# Patient Record
Sex: Female | Born: 1945 | Race: White | Hispanic: No | Marital: Single | Smoking: Smoker, current status unknown
Health system: Southern US, Community
[De-identification: ages and names within clinical notes are randomized; demographics above are authoritative.]

## PROBLEM LIST (undated history)

## (undated) DIAGNOSIS — I10 Essential (primary) hypertension: Secondary | ICD-10-CM

## (undated) DIAGNOSIS — N189 Chronic kidney disease, unspecified: Secondary | ICD-10-CM

## (undated) DIAGNOSIS — E119 Type 2 diabetes mellitus without complications: Secondary | ICD-10-CM

## (undated) DIAGNOSIS — J9610 Chronic respiratory failure, unspecified whether with hypoxia or hypercapnia: Secondary | ICD-10-CM

## (undated) DIAGNOSIS — K5981 Ogilvie syndrome: Secondary | ICD-10-CM

## (undated) DIAGNOSIS — K219 Gastro-esophageal reflux disease without esophagitis: Secondary | ICD-10-CM

## (undated) DIAGNOSIS — F0150 Vascular dementia without behavioral disturbance: Secondary | ICD-10-CM

## (undated) DIAGNOSIS — Z930 Tracheostomy status: Secondary | ICD-10-CM

---

## 2020-01-05 ENCOUNTER — Emergency Department (HOSPITAL_COMMUNITY): Payer: Medicare Other

## 2020-01-05 ENCOUNTER — Other Ambulatory Visit: Payer: Self-pay

## 2020-01-05 ENCOUNTER — Encounter (HOSPITAL_COMMUNITY): Payer: Self-pay | Admitting: Pulmonary Disease

## 2020-01-05 ENCOUNTER — Inpatient Hospital Stay (HOSPITAL_COMMUNITY)
Admission: EM | Admit: 2020-01-05 | Discharge: 2020-03-21 | DRG: 870 | Disposition: A | Discharge status: Skilled Nursing Facility | Payer: Medicare Other | Attending: Internal Medicine | Admitting: Internal Medicine

## 2020-01-05 DIAGNOSIS — I132 Hypertensive heart and chronic kidney disease with heart failure and with stage 5 chronic kidney disease, or end stage renal disease: Secondary | ICD-10-CM | POA: Diagnosis present

## 2020-01-05 DIAGNOSIS — N179 Acute kidney failure, unspecified: Secondary | ICD-10-CM | POA: Diagnosis not present

## 2020-01-05 DIAGNOSIS — E669 Obesity, unspecified: Secondary | ICD-10-CM | POA: Diagnosis present

## 2020-01-05 DIAGNOSIS — J9611 Chronic respiratory failure with hypoxia: Secondary | ICD-10-CM

## 2020-01-05 DIAGNOSIS — D631 Anemia in chronic kidney disease: Secondary | ICD-10-CM | POA: Diagnosis not present

## 2020-01-05 DIAGNOSIS — R0602 Shortness of breath: Secondary | ICD-10-CM

## 2020-01-05 DIAGNOSIS — E039 Hypothyroidism, unspecified: Secondary | ICD-10-CM | POA: Diagnosis present

## 2020-01-05 DIAGNOSIS — J9600 Acute respiratory failure, unspecified whether with hypoxia or hypercapnia: Secondary | ICD-10-CM

## 2020-01-05 DIAGNOSIS — K5981 Ogilvie syndrome: Secondary | ICD-10-CM | POA: Diagnosis present

## 2020-01-05 DIAGNOSIS — I469 Cardiac arrest, cause unspecified: Secondary | ICD-10-CM | POA: Diagnosis not present

## 2020-01-05 DIAGNOSIS — R579 Shock, unspecified: Secondary | ICD-10-CM | POA: Diagnosis present

## 2020-01-05 DIAGNOSIS — I959 Hypotension, unspecified: Secondary | ICD-10-CM

## 2020-01-05 DIAGNOSIS — Z7189 Other specified counseling: Secondary | ICD-10-CM | POA: Diagnosis not present

## 2020-01-05 DIAGNOSIS — L8990 Pressure ulcer of unspecified site, unspecified stage: Secondary | ICD-10-CM | POA: Insufficient documentation

## 2020-01-05 DIAGNOSIS — Z79899 Other long term (current) drug therapy: Secondary | ICD-10-CM

## 2020-01-05 DIAGNOSIS — E1122 Type 2 diabetes mellitus with diabetic chronic kidney disease: Secondary | ICD-10-CM | POA: Diagnosis present

## 2020-01-05 DIAGNOSIS — Z6841 Body Mass Index (BMI) 40.0 and over, adult: Secondary | ICD-10-CM | POA: Diagnosis not present

## 2020-01-05 DIAGNOSIS — Z9115 Patient's noncompliance with renal dialysis: Secondary | ICD-10-CM

## 2020-01-05 DIAGNOSIS — J151 Pneumonia due to Pseudomonas: Secondary | ICD-10-CM | POA: Diagnosis present

## 2020-01-05 DIAGNOSIS — R601 Generalized edema: Secondary | ICD-10-CM | POA: Diagnosis not present

## 2020-01-05 DIAGNOSIS — R1010 Upper abdominal pain, unspecified: Secondary | ICD-10-CM | POA: Diagnosis not present

## 2020-01-05 DIAGNOSIS — J9622 Acute and chronic respiratory failure with hypercapnia: Secondary | ICD-10-CM | POA: Diagnosis present

## 2020-01-05 DIAGNOSIS — J440 Chronic obstructive pulmonary disease with acute lower respiratory infection: Secondary | ICD-10-CM | POA: Diagnosis present

## 2020-01-05 DIAGNOSIS — G894 Chronic pain syndrome: Secondary | ICD-10-CM | POA: Diagnosis not present

## 2020-01-05 DIAGNOSIS — N186 End stage renal disease: Secondary | ICD-10-CM | POA: Diagnosis present

## 2020-01-05 DIAGNOSIS — N170 Acute kidney failure with tubular necrosis: Secondary | ICD-10-CM | POA: Diagnosis present

## 2020-01-05 DIAGNOSIS — Z781 Physical restraint status: Secondary | ICD-10-CM

## 2020-01-05 DIAGNOSIS — R059 Cough, unspecified: Secondary | ICD-10-CM

## 2020-01-05 DIAGNOSIS — E1165 Type 2 diabetes mellitus with hyperglycemia: Secondary | ICD-10-CM | POA: Diagnosis not present

## 2020-01-05 DIAGNOSIS — J90 Pleural effusion, not elsewhere classified: Secondary | ICD-10-CM | POA: Diagnosis not present

## 2020-01-05 DIAGNOSIS — N19 Unspecified kidney failure: Secondary | ICD-10-CM | POA: Diagnosis not present

## 2020-01-05 DIAGNOSIS — J9601 Acute respiratory failure with hypoxia: Secondary | ICD-10-CM | POA: Diagnosis not present

## 2020-01-05 DIAGNOSIS — G9341 Metabolic encephalopathy: Secondary | ICD-10-CM | POA: Diagnosis not present

## 2020-01-05 DIAGNOSIS — Z515 Encounter for palliative care: Secondary | ICD-10-CM | POA: Diagnosis not present

## 2020-01-05 DIAGNOSIS — K5900 Constipation, unspecified: Secondary | ICD-10-CM | POA: Diagnosis present

## 2020-01-05 DIAGNOSIS — J690 Pneumonitis due to inhalation of food and vomit: Secondary | ICD-10-CM | POA: Diagnosis not present

## 2020-01-05 DIAGNOSIS — G8929 Other chronic pain: Secondary | ICD-10-CM | POA: Diagnosis present

## 2020-01-05 DIAGNOSIS — A419 Sepsis, unspecified organism: Secondary | ICD-10-CM

## 2020-01-05 DIAGNOSIS — D6959 Other secondary thrombocytopenia: Secondary | ICD-10-CM | POA: Diagnosis present

## 2020-01-05 DIAGNOSIS — R6521 Severe sepsis with septic shock: Secondary | ICD-10-CM | POA: Diagnosis present

## 2020-01-05 DIAGNOSIS — R05 Cough: Secondary | ICD-10-CM

## 2020-01-05 DIAGNOSIS — L89151 Pressure ulcer of sacral region, stage 1: Secondary | ICD-10-CM | POA: Diagnosis present

## 2020-01-05 DIAGNOSIS — T68XXXA Hypothermia, initial encounter: Secondary | ICD-10-CM | POA: Diagnosis not present

## 2020-01-05 DIAGNOSIS — J9602 Acute respiratory failure with hypercapnia: Secondary | ICD-10-CM

## 2020-01-05 DIAGNOSIS — J15212 Pneumonia due to Methicillin resistant Staphylococcus aureus: Secondary | ICD-10-CM | POA: Diagnosis present

## 2020-01-05 DIAGNOSIS — I5032 Chronic diastolic (congestive) heart failure: Secondary | ICD-10-CM | POA: Diagnosis present

## 2020-01-05 DIAGNOSIS — R609 Edema, unspecified: Secondary | ICD-10-CM | POA: Diagnosis not present

## 2020-01-05 DIAGNOSIS — E876 Hypokalemia: Secondary | ICD-10-CM | POA: Diagnosis not present

## 2020-01-05 DIAGNOSIS — R109 Unspecified abdominal pain: Secondary | ICD-10-CM | POA: Diagnosis not present

## 2020-01-05 DIAGNOSIS — R570 Cardiogenic shock: Secondary | ICD-10-CM | POA: Diagnosis present

## 2020-01-05 DIAGNOSIS — F0150 Vascular dementia without behavioral disturbance: Secondary | ICD-10-CM | POA: Diagnosis present

## 2020-01-05 DIAGNOSIS — R9389 Abnormal findings on diagnostic imaging of other specified body structures: Secondary | ICD-10-CM

## 2020-01-05 DIAGNOSIS — Z9049 Acquired absence of other specified parts of digestive tract: Secondary | ICD-10-CM

## 2020-01-05 DIAGNOSIS — I4891 Unspecified atrial fibrillation: Secondary | ICD-10-CM | POA: Diagnosis not present

## 2020-01-05 DIAGNOSIS — F339 Major depressive disorder, recurrent, unspecified: Secondary | ICD-10-CM

## 2020-01-05 DIAGNOSIS — N390 Urinary tract infection, site not specified: Secondary | ICD-10-CM | POA: Diagnosis present

## 2020-01-05 DIAGNOSIS — Z992 Dependence on renal dialysis: Secondary | ICD-10-CM

## 2020-01-05 DIAGNOSIS — J9621 Acute and chronic respiratory failure with hypoxia: Secondary | ICD-10-CM | POA: Diagnosis present

## 2020-01-05 DIAGNOSIS — Z452 Encounter for adjustment and management of vascular access device: Secondary | ICD-10-CM

## 2020-01-05 DIAGNOSIS — K219 Gastro-esophageal reflux disease without esophagitis: Secondary | ICD-10-CM | POA: Diagnosis present

## 2020-01-05 DIAGNOSIS — R531 Weakness: Secondary | ICD-10-CM | POA: Diagnosis not present

## 2020-01-05 DIAGNOSIS — R9431 Abnormal electrocardiogram [ECG] [EKG]: Secondary | ICD-10-CM | POA: Diagnosis not present

## 2020-01-05 DIAGNOSIS — D509 Iron deficiency anemia, unspecified: Secondary | ICD-10-CM | POA: Diagnosis present

## 2020-01-05 DIAGNOSIS — E871 Hypo-osmolality and hyponatremia: Secondary | ICD-10-CM | POA: Diagnosis not present

## 2020-01-05 DIAGNOSIS — Z930 Tracheostomy status: Secondary | ICD-10-CM | POA: Diagnosis not present

## 2020-01-05 DIAGNOSIS — F419 Anxiety disorder, unspecified: Secondary | ICD-10-CM | POA: Diagnosis present

## 2020-01-05 DIAGNOSIS — A4102 Sepsis due to Methicillin resistant Staphylococcus aureus: Principal | ICD-10-CM | POA: Diagnosis present

## 2020-01-05 DIAGNOSIS — E874 Mixed disorder of acid-base balance: Secondary | ICD-10-CM | POA: Diagnosis present

## 2020-01-05 DIAGNOSIS — Z20822 Contact with and (suspected) exposure to covid-19: Secondary | ICD-10-CM | POA: Diagnosis present

## 2020-01-05 DIAGNOSIS — E7220 Disorder of urea cycle metabolism, unspecified: Secondary | ICD-10-CM | POA: Diagnosis not present

## 2020-01-05 DIAGNOSIS — E11649 Type 2 diabetes mellitus with hypoglycemia without coma: Secondary | ICD-10-CM | POA: Diagnosis not present

## 2020-01-05 DIAGNOSIS — N189 Chronic kidney disease, unspecified: Secondary | ICD-10-CM | POA: Diagnosis not present

## 2020-01-05 DIAGNOSIS — Z931 Gastrostomy status: Secondary | ICD-10-CM

## 2020-01-05 DIAGNOSIS — F329 Major depressive disorder, single episode, unspecified: Secondary | ICD-10-CM | POA: Diagnosis present

## 2020-01-05 DIAGNOSIS — Y95 Nosocomial condition: Secondary | ICD-10-CM | POA: Diagnosis present

## 2020-01-05 DIAGNOSIS — Z9119 Patient's noncompliance with other medical treatment and regimen: Secondary | ICD-10-CM

## 2020-01-05 DIAGNOSIS — R5381 Other malaise: Secondary | ICD-10-CM | POA: Diagnosis not present

## 2020-01-05 DIAGNOSIS — D6489 Other specified anemias: Secondary | ICD-10-CM | POA: Diagnosis present

## 2020-01-05 DIAGNOSIS — R1312 Dysphagia, oropharyngeal phase: Secondary | ICD-10-CM | POA: Diagnosis not present

## 2020-01-05 DIAGNOSIS — Z794 Long term (current) use of insulin: Secondary | ICD-10-CM

## 2020-01-05 DIAGNOSIS — M7989 Other specified soft tissue disorders: Secondary | ICD-10-CM | POA: Diagnosis not present

## 2020-01-05 DIAGNOSIS — N184 Chronic kidney disease, stage 4 (severe): Secondary | ICD-10-CM | POA: Diagnosis not present

## 2020-01-05 HISTORY — DX: Chronic respiratory failure, unspecified whether with hypoxia or hypercapnia: J96.10

## 2020-01-05 HISTORY — DX: Chronic kidney disease, unspecified: N18.9

## 2020-01-05 HISTORY — DX: Type 2 diabetes mellitus without complications: E11.9

## 2020-01-05 HISTORY — DX: Ogilvie syndrome: K59.81

## 2020-01-05 HISTORY — DX: Vascular dementia, unspecified severity, without behavioral disturbance, psychotic disturbance, mood disturbance, and anxiety: F01.50

## 2020-01-05 HISTORY — DX: Gastro-esophageal reflux disease without esophagitis: K21.9

## 2020-01-05 HISTORY — DX: Tracheostomy status: Z93.0

## 2020-01-05 HISTORY — DX: Essential (primary) hypertension: I10

## 2020-01-05 LAB — LIPASE, BLOOD: Lipase: 90 U/L — ABNORMAL HIGH (ref 11–51)

## 2020-01-05 LAB — URINALYSIS, ROUTINE W REFLEX MICROSCOPIC
Bilirubin Urine: NEGATIVE
Glucose, UA: NEGATIVE mg/dL
Ketones, ur: 5 mg/dL — AB
Nitrite: NEGATIVE
Protein, ur: 100 mg/dL — AB
Specific Gravity, Urine: 1.018 (ref 1.005–1.030)
WBC, UA: >50 WBC/hpf — ABNORMAL HIGH (ref 0–5)
pH: 5 (ref 5.0–8.0)

## 2020-01-05 LAB — CBC WITH DIFFERENTIAL/PLATELET
Abs Immature Granulocytes: 0.08 10*3/uL — ABNORMAL HIGH (ref 0.00–0.07)
Basophils Absolute: 0 10*3/uL (ref 0.0–0.1)
Basophils Relative: 0 %
Eosinophils Absolute: 0.1 10*3/uL (ref 0.0–0.5)
Eosinophils Relative: 1 %
HCT: 30.9 % — ABNORMAL LOW (ref 36.0–46.0)
Hemoglobin: 9.4 g/dL — ABNORMAL LOW (ref 12.0–15.0)
Immature Granulocytes: 1 %
Lymphocytes Relative: 7 %
Lymphs Abs: 0.7 10*3/uL (ref 0.7–4.0)
MCH: 28.3 pg (ref 26.0–34.0)
MCHC: 30.4 g/dL (ref 30.0–36.0)
MCV: 93.1 fL (ref 80.0–100.0)
Monocytes Absolute: 0.6 10*3/uL (ref 0.1–1.0)
Monocytes Relative: 6 %
Neutro Abs: 7.9 10*3/uL — ABNORMAL HIGH (ref 1.7–7.7)
Neutrophils Relative %: 85 %
Platelets: 89 10*3/uL — ABNORMAL LOW (ref 150–400)
RBC: 3.32 MIL/uL — ABNORMAL LOW (ref 3.87–5.11)
RDW: 18.3 % — ABNORMAL HIGH (ref 11.5–15.5)
WBC: 9.3 10*3/uL (ref 4.0–10.5)
nRBC: 0 % (ref 0.0–0.2)

## 2020-01-05 LAB — COMPREHENSIVE METABOLIC PANEL
ALT: 22 U/L (ref 0–44)
AST: 25 U/L (ref 15–41)
Albumin: 2.4 g/dL — ABNORMAL LOW (ref 3.5–5.0)
Alkaline Phosphatase: 177 U/L — ABNORMAL HIGH (ref 38–126)
Anion gap: 20 — ABNORMAL HIGH (ref 5–15)
BUN: 128 mg/dL — ABNORMAL HIGH (ref 8–23)
CO2: 11 mmol/L — ABNORMAL LOW (ref 22–32)
Calcium: 9.7 mg/dL (ref 8.9–10.3)
Chloride: 101 mmol/L (ref 98–111)
Creatinine, Ser: 2.47 mg/dL — ABNORMAL HIGH (ref 0.44–1.00)
GFR calc Af Amer: 22 mL/min — ABNORMAL LOW (ref >60)
GFR calc non Af Amer: 19 mL/min — ABNORMAL LOW (ref >60)
Glucose, Bld: 113 mg/dL — ABNORMAL HIGH (ref 70–99)
Potassium: 4.9 mmol/L (ref 3.5–5.1)
Sodium: 132 mmol/L — ABNORMAL LOW (ref 135–145)
Total Bilirubin: UNDETERMINED mg/dL (ref 0.3–1.2)
Total Protein: 5.8 g/dL — ABNORMAL LOW (ref 6.5–8.1)

## 2020-01-05 LAB — PROTIME-INR
INR: 1.1 (ref 0.8–1.2)
Prothrombin Time: 14.3 seconds (ref 11.4–15.2)

## 2020-01-05 LAB — LACTIC ACID, PLASMA: Lactic Acid, Venous: 0.3 mmol/L — ABNORMAL LOW (ref 0.5–1.9)

## 2020-01-05 LAB — BRAIN NATRIURETIC PEPTIDE: B Natriuretic Peptide: 1080.6 pg/mL — ABNORMAL HIGH (ref 0.0–100.0)

## 2020-01-05 LAB — RESPIRATORY PANEL BY RT PCR (FLU A&B, COVID)
Influenza A by PCR: NEGATIVE
Influenza B by PCR: NEGATIVE
SARS Coronavirus 2 by RT PCR: NEGATIVE

## 2020-01-05 LAB — TROPONIN I (HIGH SENSITIVITY): Troponin I (High Sensitivity): 15 ng/L (ref <18)

## 2020-01-05 LAB — TSH: TSH: 5.314 u[IU]/mL — ABNORMAL HIGH (ref 0.350–4.500)

## 2020-01-05 LAB — APTT: aPTT: 34 seconds (ref 24–36)

## 2020-01-05 MED ORDER — VANCOMYCIN HCL IN DEXTROSE 1-5 GM/200ML-% IV SOLN: 1000.0000 mg | Freq: Once | INTRAVENOUS | Status: DC

## 2020-01-05 MED ORDER — GENERIC EXTERNAL MEDICATION
Status: DC
Start: ? — End: 2020-01-05

## 2020-01-05 MED ORDER — VANCOMYCIN HCL 2000 MG/400ML IV SOLN
2000.0000 mg | Freq: Once | INTRAVENOUS | Status: DC
  Filled 2020-01-05: qty 400

## 2020-01-05 MED ORDER — LEVOTHYROXINE SODIUM 50 MCG PO TABS
50.00 | ORAL_TABLET | ORAL | Status: DC
Start: 2020-01-06 — End: 2020-01-05

## 2020-01-05 MED ORDER — MELATONIN 3 MG PO TABS
3.00 | ORAL_TABLET | ORAL | Status: DC
Start: 2020-01-05 — End: 2020-01-05

## 2020-01-05 MED ORDER — HYDRALAZINE HCL 20 MG/ML IJ SOLN
10.00 | INTRAMUSCULAR | Status: DC
Start: ? — End: 2020-01-05

## 2020-01-05 MED ORDER — HEPARIN SODIUM LOCK FLUSH 100 UNIT/ML IV SOLN
300.00 | INTRAVENOUS | Status: DC
Start: ? — End: 2020-01-05

## 2020-01-05 MED ORDER — CLOTRIMAZOLE 1 % EX CREA
TOPICAL_CREAM | CUTANEOUS | Status: DC
Start: 2020-01-05 — End: 2020-01-05

## 2020-01-05 MED ORDER — HEPARIN SODIUM (PORCINE) 5000 UNIT/ML IJ SOLN
5000.0000 [IU] | Freq: Three times a day (TID) | INTRAMUSCULAR | Status: DC
  Administered 2020-01-06 – 2020-01-10 (×12): 5000 [IU] via SUBCUTANEOUS
  Filled 2020-01-05 (×11): qty 1

## 2020-01-05 MED ORDER — LIDOCAINE 5 % EX PTCH
1.00 | MEDICATED_PATCH | CUTANEOUS | Status: DC
Start: 2020-01-05 — End: 2020-01-05

## 2020-01-05 MED ORDER — SODIUM CHLORIDE 0.9 % IV SOLN
25.00 | INTRAVENOUS | Status: DC
Start: ? — End: 2020-01-05

## 2020-01-05 MED ORDER — MULTIVITAMIN & MINERAL PO LIQD
15.00 | ORAL | Status: DC
Start: 2020-01-06 — End: 2020-01-05

## 2020-01-05 MED ORDER — SENNOSIDES 8.6 MG PO TABS
1.00 | ORAL_TABLET | ORAL | Status: DC
Start: ? — End: 2020-01-05

## 2020-01-05 MED ORDER — ONDANSETRON HCL 4 MG/2ML IJ SOLN
4.00 | INTRAMUSCULAR | Status: DC
Start: ? — End: 2020-01-05

## 2020-01-05 MED ORDER — AMLODIPINE BESYLATE 5 MG PO TABS
10.00 | ORAL_TABLET | ORAL | Status: DC
Start: 2020-01-06 — End: 2020-01-05

## 2020-01-05 MED ORDER — SIMETHICONE 80 MG PO CHEW
80.00 | CHEWABLE_TABLET | ORAL | Status: DC
Start: ? — End: 2020-01-05

## 2020-01-05 MED ORDER — TRAMADOL HCL 50 MG PO TABS
50.00 | ORAL_TABLET | ORAL | Status: DC
Start: ? — End: 2020-01-05

## 2020-01-05 MED ORDER — ALPRAZOLAM 0.25 MG PO TABS
0.25 | ORAL_TABLET | ORAL | Status: DC
Start: ? — End: 2020-01-05

## 2020-01-05 MED ORDER — GENERIC EXTERNAL MEDICATION
40.00 | Status: DC
Start: 2020-01-06 — End: 2020-01-05

## 2020-01-05 MED ORDER — DEXTROSE 50 % IV SOLN
25.00 | INTRAVENOUS | Status: DC
Start: ? — End: 2020-01-05

## 2020-01-05 MED ORDER — SODIUM CHLORIDE 0.9 % IV SOLN
2.0000 g | INTRAVENOUS | Status: AC
  Administered 2020-01-06 – 2020-01-11 (×6): 2 g via INTRAVENOUS
  Filled 2020-01-05 (×7): qty 2

## 2020-01-05 MED ORDER — GENERIC EXTERNAL MEDICATION
1500.00 | Status: DC
Start: 2020-01-06 — End: 2020-01-05

## 2020-01-05 MED ORDER — INSULIN GLARGINE 100 UNIT/ML SOLOSTAR PEN
5.00 | PEN_INJECTOR | SUBCUTANEOUS | Status: DC
Start: 2020-01-05 — End: 2020-01-05

## 2020-01-05 MED ORDER — ATORVASTATIN CALCIUM 80 MG PO TABS
80.00 | ORAL_TABLET | ORAL | Status: DC
Start: 2020-01-05 — End: 2020-01-05

## 2020-01-05 MED ORDER — BUSPIRONE HCL 5 MG PO TABS
10.00 | ORAL_TABLET | ORAL | Status: DC
Start: 2020-01-05 — End: 2020-01-05

## 2020-01-05 MED ORDER — SODIUM CHLORIDE 0.9 % IV SOLN
500.00 | INTRAVENOUS | Status: DC
Start: ? — End: 2020-01-05

## 2020-01-05 MED ORDER — BISACODYL 10 MG RE SUPP
10.00 | RECTAL | Status: DC
Start: ? — End: 2020-01-05

## 2020-01-05 MED ORDER — CHOLECALCIFEROL 25 MCG (1000 UT) PO TABS
2000.00 | ORAL_TABLET | ORAL | Status: DC
Start: 2020-01-06 — End: 2020-01-05

## 2020-01-05 MED ORDER — SODIUM CHLORIDE 0.9 % IV BOLUS
1000.0000 mL | Freq: Once | INTRAVENOUS | Status: AC
  Administered 2020-01-05: 1000 mL via INTRAVENOUS

## 2020-01-05 MED ORDER — NITROGLYCERIN 0.4 MG SL SUBL
0.40 | SUBLINGUAL_TABLET | SUBLINGUAL | Status: DC
Start: ? — End: 2020-01-05

## 2020-01-05 MED ORDER — PAROXETINE HCL 20 MG PO TABS
60.00 | ORAL_TABLET | ORAL | Status: DC
Start: 2020-01-06 — End: 2020-01-05

## 2020-01-05 MED ORDER — MONTELUKAST SODIUM 10 MG PO TABS
10.00 | ORAL_TABLET | ORAL | Status: DC
Start: 2020-01-05 — End: 2020-01-05

## 2020-01-05 MED ORDER — METOPROLOL TARTRATE 25 MG PO TABS
12.50 | ORAL_TABLET | ORAL | Status: DC
Start: 2020-01-05 — End: 2020-01-05

## 2020-01-05 MED ORDER — GENERIC EXTERNAL MEDICATION
12.50 | Status: DC
Start: ? — End: 2020-01-05

## 2020-01-05 MED ORDER — METRONIDAZOLE IN NACL 5-0.79 MG/ML-% IV SOLN
500.0000 mg | Freq: Once | INTRAVENOUS | Status: AC
  Administered 2020-01-05: 500 mg via INTRAVENOUS
  Filled 2020-01-05: qty 100

## 2020-01-05 MED ORDER — IPRATROPIUM-ALBUTEROL 0.5-2.5 (3) MG/3ML IN SOLN
3.00 | RESPIRATORY_TRACT | Status: DC
Start: ? — End: 2020-01-05

## 2020-01-05 MED ORDER — GENERIC EXTERNAL MEDICATION
1.00 | Status: DC
Start: 2020-01-05 — End: 2020-01-05

## 2020-01-05 MED ORDER — NOREPINEPHRINE 4 MG/250ML-% IV SOLN
0.0000 ug/min | INTRAVENOUS | Status: DC
  Administered 2020-01-05 – 2020-01-06 (×2): 2 ug/min via INTRAVENOUS
  Filled 2020-01-05 (×4): qty 250

## 2020-01-05 MED ORDER — ALUM & MAG HYDROXIDE-SIMETH 200-200-20 MG/5ML PO SUSP
30.00 | ORAL | Status: DC
Start: ? — End: 2020-01-05

## 2020-01-05 MED ORDER — INSULIN LISPRO 100 UNIT/ML ~~LOC~~ SOLN
0.00 | SUBCUTANEOUS | Status: DC
Start: 2020-01-05 — End: 2020-01-05

## 2020-01-05 MED ORDER — GLUCOSE 40 % PO GEL
ORAL | Status: DC
Start: ? — End: 2020-01-05

## 2020-01-05 MED ORDER — GLUCAGON (RDNA) 1 MG IJ KIT
1.00 | PACK | INTRAMUSCULAR | Status: DC
Start: ? — End: 2020-01-05

## 2020-01-05 MED ORDER — POLYETHYLENE GLYCOL 3350 17 GM/SCOOP PO POWD
17.00 | ORAL | Status: DC
Start: ? — End: 2020-01-05

## 2020-01-05 MED ORDER — ACETAMINOPHEN 160 MG/5ML PO SOLN
975.00 | ORAL | Status: DC
Start: 2020-01-05 — End: 2020-01-05

## 2020-01-05 MED ORDER — NYSTATIN 100000 UNIT/ML MT SUSP
500000.00 | OROMUCOSAL | Status: DC
Start: 2020-01-05 — End: 2020-01-05

## 2020-01-05 MED ORDER — SODIUM CHLORIDE 0.9 % IV SOLN
2.0000 g | Freq: Once | INTRAVENOUS | Status: AC
  Administered 2020-01-05: 2 g via INTRAVENOUS
  Filled 2020-01-05: qty 2

## 2020-01-05 NOTE — Progress Notes (Signed)
Pharmacy Antibiotic Note Bailey Cox is a 74 y.o. female admitted on 01/05/2020 with sepsis. Pharmacy has been consulted for vancomycin and cefepime dosing. Pt is hypothermic and WBC is WNL. SCr is elevated and has been increasing over the last week. She has a complicated history with prolonged hospitalization and was on vancomycin and cefepime PTA. Pt was previously on dialysis but this appears to have been stopped.   Plan: Check a vancomycin level to determine if this needs to be re-dosed Cefepime 2gm IV Q24H F/u renal fxn, C&S, clinical status and peak/trough at SS  Height: 5\' 4"  (162.6 cm) Weight: 249 lb 1.9 oz (113 kg) IBW/kg (Calculated) : 54.7  Temp (24hrs), Avg:94.1 F (34.5 C), Min:94.1 F (34.5 C), Max:94.1 F (34.5 C)  Recent Labs  Lab 01/05/20 2004  WBC 9.3  CREATININE 2.47*  LATICACIDVEN 0.3*    Estimated Creatinine Clearance: 25.7 mL/min (A) (by C-G formula based on SCr of 2.47 mg/dL (H)).    No Known Allergies  Antimicrobials this admission: Vanc PTA>> Cefepime PTA>> Flagyl x 1 3/1  Dose adjustments this admission: N/A  Microbiology results: 2/21 Resp - EColi, pseudomonas, MRSA 2/21 Urine - EColi  Thank you for allowing pharmacy to be a part of this patient's care.  Tiron Suski, Rande Lawman 01/05/2020 9:52 PM

## 2020-01-05 NOTE — H&P (Signed)
NAME:  Bailey Cox, MRN:  [id number], DOB:  October 17, 1946, LOS: 0 ADMISSION DATE:  01/05/2020, CONSULTATION DATE:  01/06/20 REFERRING MD:  T[place] EDP, CHIEF COMPLAINT:   Shock  Brief History   74 year old female admitted 3/1 from [place] where she had just arrived after long admission to [facility] where she suffered PEA arrest during endoscopy and ended up requiring tracheostomy. Presents to [facility] ED with widespread peripheral edema and acidosis.   History of present illness   74 year old female with PMH as below, which is significant for long admission in the [facility] system from 10/2019 all the way through 01/05/2020.  She was originally hospitalized in September 2020 with acute cholecystitis and underwent a cholecystectomy.  She was then sent to SNF for short duration but had return to [facility] to have a bile duct stent placed ultimately returning to SNF.  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  While hospitalized she developed fluid overload and was ultimately transferred to [facility] for dialysis.  Her course after that point become somewhat unclear, but as described by her family she was on and off the ventilator for several procedures and was eventually left intubated.  One of these procedures was an endoscopy during which she unfortunately suffered a cardiac arrest.  Details of the arrest are unclear.  She remained on the ventilator and ultimately underwent tracheostomy.  Her hospital course was also complicated by MRSA pneumonia.  She was ultimately able to come off of hemodialysis and was treated intermittently with diuresis.  It sounds like she bounced in and out of the ICU a few times with volume overload.  On the tail end of her admission she still struggled with kidney disease and was being treated for a urinary tract infection with cefepime and vancomycin.  Plans were to stop on 3/2.  She was discharged from the [facility] system on 3/1 to  [facility] in [place].  Upon arrival to [facility] they deemed her too sick for admission and transferred her to [facility] emergency department with complaints of volume overload and acidosis.   Past Medical History   has a past medical history of Chronic respiratory failure ([place]), CKD (chronic kidney disease), DM (diabetes mellitus) ([place]), GERD (gastroesophageal reflux disease), HTN (hypertension), Ogilvie syndrome, Tracheostomy status ([place]), and Vascular dementia ([place]).   Significant Hospital Events   07/2019 admit to [facility] for cholecystitis  09/2019 tx to [facility] for renal failure/volume overload. HD started.  10/2019 PEA arrest during EGD . Intubated 11/2019 Trach. HD stopped.   Consults:    Procedures:  PICC pre admit > Trach pre admit >  Significant Diagnostic Tests:   Echo 10/24/2019 > LVEF 55%, Grade 1 DD. RV systolic function normal.   Micro Data:  Blood 3/2 Urine 3/2  Antimicrobials:  Cefepime Pre hosp (stop date 3/2)  Vancomycin pre hosp (stop date 3/2)   Interim history/subjective:    Objective   Blood pressure (!) 77/21, pulse (!) 52, temperature (!) 93.9 F (34.4 C), resp. rate 17, height 5\' 4"  (1.626 m), weight 113 kg, SpO2 97 %.        Intake/Output Summary (Last 24 hours) at 01/05/2020 2314 Last data filed at 01/05/2020 2245 Gross per 24 hour  Intake 1000 ml  Output --  Net 1000 ml   Filed Weights   01/05/20 1934  Weight: 113 kg    Examination: General: Obese female with trach HENT: [place]/AT, PERRL,  no JVD Lungs: Clear bilateral breath sounds Cardiovascular: RRR, no MRG. Marked peripheral edema.  Abdomen: Soft, non-tender, non-distended. Extremities: 4+ pitting edema.  Neuro: Alert, oriented to self. Follows commands. Answers yes/no. Reports this is near her recent baseline.  GU: Foley  Resolved Hospital Problem list     Assessment & Plan:   Shock: septic vs cardiogenic. Favor cardiogenic shock. Severely volume overloaded on  exam - Continue levophed titrated to MAP goal 19mmHg - Hold IVF ideally, but will need bicarb infusion in the short term - Echo - Lactic 0.3 reassuring.  - Place art line to better determine BP  Acute kidney injury on CKD: was on HD acutely in late 2020. Was stopped, but it seems as though her renal function has been fluctuating since that time prompting many ICU transfers for agressive diuresis.  - May ultimately need HD again - Family would like to avoid.  - She is making some urine, so we will try to diurese. If she fails will likely need nephrology consult and CRRT. Lasix 80mg  x 1 and re-evaluate.  - Follow BMP - Bicarb infusion  Acidosis: mixed metabolic and respiratory: metabolic component likely uremic.  - start bicarb infusion - may need vent to help compensate.   HFpEF: Most recent echo with hyperdynamic LV  UTI: treatment started at [facility] with cefepime, vanco on 2/25. Stop date 3/2 - Continue cefepime, DC vanco given renal insult.  - Urine culture  Tracheostomy status - Currently doing OK on 6L trach collar. Cuffed trach, cuff down.  - Family OK with vent if necessary.  - If repeat ABG not improved will probably need to go on vent.   DM:  CBG monitoring and SSI  Elevated lipase: biliary stent removed in December - trend lipase - hold off on imaging for now  Best practice:  Diet: NPO Pain/Anxiety/Delirium protocol (if indicated): NA VAP protocol (if indicated): NA DVT prophylaxis: heparin SQ GI prophylaxis: NA Glucose control: SSI Mobility: BR Code Status: FULL Family Communication: Daughter and POA updated. Endorse full scope of treatment. They are unhappy with her course so far. She has been hospitalized since September.  Disposition: ICU, critically ill.   Labs   CBC: Recent Labs  Lab 01/05/20 2004 01/05/20 2030  WBC 9.3  --   NEUTROABS 7.9*  --   HGB 9.4* 10.2*  HCT 30.9* 30.0*  MCV 93.1  --   PLT 89*  --     Basic Metabolic  Panel: Recent Labs  Lab 01/05/20 2004 01/05/20 2030  NA 132* 128*  K 4.9 6.3*  CL 101  --   CO2 11*  --   GLUCOSE 113*  --   BUN 128*  --   CREATININE 2.47*  --   CALCIUM 9.7  --    GFR: Estimated Creatinine Clearance: 25.7 mL/min (A) (by C-G formula based on SCr of 2.47 mg/dL (H)). Recent Labs  Lab 01/05/20 2004  WBC 9.3  LATICACIDVEN 0.3*    Liver Function Tests: Recent Labs  Lab 01/05/20 2004  AST 25  ALT 22  ALKPHOS 177*  BILITOT QUANTITY NOT SUFFICIENT, UNABLE TO PERFORM TEST  PROT 5.8*  ALBUMIN 2.4*   Recent Labs  Lab 01/05/20 2004  LIPASE 90*   No results for input(s): AMMONIA in the last 168 hours.  ABG    Component Value Date/Time   HCO3 16.9 (L) 01/05/2020 2030   TCO2 18 (L) 01/05/2020 2030   ACIDBASEDEF 12.0 (H) 01/05/2020 2030   O2SAT  99.0 01/05/2020 2030     Coagulation Profile: Recent Labs  Lab 01/05/20 2004  INR 1.1    Cardiac Enzymes: No results for input(s): CKTOTAL, CKMB, CKMBINDEX, TROPONINI in the last 168 hours.  HbA1C: No results found for: HGBA1C  CBG: No results for input(s): GLUCAP in the last 168 hours.  Review of Systems:   Bolds are positive  Constitutional: weight loss, gain, night sweats, Fevers, chills, fatigue .  HEENT: headaches, Sore throat, sneezing, nasal congestion, post nasal drip, Difficulty swallowing, Tooth/dental problems, visual complaints visual changes, ear ache CV:  chest pain, radiates:,Orthopnea, PND, swelling in lower extremities, dizziness, palpitations, syncope.  GI  heartburn, indigestion, abdominal pain, nausea, vomiting, diarrhea, change in bowel habits, loss of appetite, bloody stools.  Resp: cough, productive: , hemoptysis, dyspnea, chest pain, pleuritic.  Skin: rash or itching or icterus GU: dysuria, change in color of urine, urgency or frequency. flank pain, hematuria  MS: joint pain or swelling. decreased range of motion  Psych: change in mood or affect. depression or anxiety.   Neuro: difficulty with speech, weakness, numbness, ataxia    Past Medical History  She,  has no past medical history on file.   Surgical History     Social History      Family History   Her family history is not on file.   Allergies No Known Allergies   Home Medications  Prior to Admission medications   Not on File     Critical care time: 50 mins     Georgann Housekeeper, AGACNP-BC [place] for personal pager PCCM on call pager [phone]  01/06/2020 12:46 AM

## 2020-01-05 NOTE — ED Provider Notes (Signed)
[facility] EMERGENCY DEPARTMENT Provider Note   CSN: [id number] Arrival date & time: 01/05/20  1923     History Chief Complaint  Patient presents with  . Abnormal Lab  . Leg Swelling    Bailey Cox is a 74 y.o. female.  The history is provided by the patient and medical records. The history is limited by the condition of the patient. No language interpreter was used.  Illness Location:  Cough, fatigue, edema Severity:  Severe Onset quality:  Gradual Duration:  1 day Timing:  Constant Progression:  Worsening Associated symptoms: chest pain, congestion, cough, fatigue and shortness of breath   Associated symptoms: no diarrhea, no headaches, no loss of consciousness, no nausea and no vomiting        No past medical history on file.  There are no problems to display for this patient.    OB History   No obstetric history on file.     No family history on file.  Social History   Tobacco Use  . Smoking status: Not on file  Substance Use Topics  . Alcohol use: Not on file  . Drug use: Not on file    Home Medications Prior to Admission medications   Not on File    Allergies    Patient has no known allergies.  Review of Systems   Review of Systems  Constitutional: Positive for fatigue.  HENT: Positive for congestion.   Respiratory: Positive for cough and shortness of breath. Negative for chest tightness.   Cardiovascular: Positive for chest pain and leg swelling. Negative for palpitations.  Gastrointestinal: Negative for diarrhea, nausea and vomiting.  Genitourinary: Negative for flank pain.  Musculoskeletal: Negative for back pain.  Skin: Negative for wound.  Neurological: Negative for loss of consciousness and headaches.  Psychiatric/Behavioral: Negative for agitation.  All other systems reviewed and are negative.   Physical Exam Updated Vital Signs Ht 5\' 4"  (1.626 m)   Wt 113 kg   BMI 42.76 kg/m   Physical Exam Vitals  and nursing note reviewed.  Constitutional:      General: She is not in acute distress.    Appearance: She is well-developed. She is obese. She is ill-appearing. She is not diaphoretic.  HENT:     Head: Normocephalic and atraumatic.     Nose: No congestion or rhinorrhea.     Mouth/Throat:     Mouth: Mucous membranes are moist.  Eyes:     Conjunctiva/sclera: Conjunctivae normal.     Pupils: Pupils are equal, round, and reactive to light.  Cardiovascular:     Rate and Rhythm: Normal rate and regular rhythm.     Heart sounds: No murmur.  Pulmonary:     Effort: Pulmonary effort is normal. No respiratory distress.     Breath sounds: Rhonchi and rales present. No wheezing.  Chest:     Chest wall: No tenderness.  Abdominal:     General: Abdomen is flat.     Palpations: Abdomen is soft.     Tenderness: There is no abdominal tenderness. There is no right CVA tenderness, left CVA tenderness, guarding or rebound.  Musculoskeletal:        General: No tenderness.     Cervical back: Neck supple. No tenderness.     Right lower leg: Edema present.     Left lower leg: Edema present.  Skin:    General: Skin is warm and dry.     Capillary Refill: Capillary refill takes less than 2  seconds.     Findings: No erythema.  Neurological:     Mental Status: She is alert.     Sensory: No sensory deficit.     Motor: No weakness.     ED Results / Procedures / Treatments   Labs (all labs ordered are listed, but only abnormal results are displayed) Labs Reviewed  CBC WITH DIFFERENTIAL/PLATELET - Abnormal; Notable for the following components:      Result Value   RBC 3.32 (*)    Hemoglobin 9.4 (*)    HCT 30.9 (*)    RDW 18.3 (*)    Platelets 89 (*)    Neutro Abs 7.9 (*)    Abs Immature Granulocytes 0.08 (*)    All other components within normal limits  COMPREHENSIVE METABOLIC PANEL - Abnormal; Notable for the following components:   Sodium 132 (*)    CO2 11 (*)    Glucose, Bld 113 (*)    BUN  128 (*)    Creatinine, Ser 2.47 (*)    Total Protein 5.8 (*)    Albumin 2.4 (*)    Alkaline Phosphatase 177 (*)    GFR calc non Af Amer 19 (*)    GFR calc Af Amer 22 (*)    Anion gap 20 (*)    All other components within normal limits  LACTIC ACID, PLASMA - Abnormal; Notable for the following components:   Lactic Acid, Venous 0.3 (*)    All other components within normal limits  LACTIC ACID, PLASMA - Abnormal; Notable for the following components:   Lactic Acid, Venous 0.3 (*)    All other components within normal limits  LIPASE, BLOOD - Abnormal; Notable for the following components:   Lipase 90 (*)    All other components within normal limits  BRAIN NATRIURETIC PEPTIDE - Abnormal; Notable for the following components:   B Natriuretic Peptide 1,080.6 (*)    All other components within normal limits  URINALYSIS, ROUTINE W REFLEX MICROSCOPIC - Abnormal; Notable for the following components:   APPearance CLOUDY (*)    Hgb urine dipstick SMALL (*)    Ketones, ur 5 (*)    Protein, ur 100 (*)    Leukocytes,Ua LARGE (*)    WBC, UA >50 (*)    Bacteria, UA RARE (*)    Non Squamous Epithelial 0-5 (*)    All other components within normal limits  TSH - Abnormal; Notable for the following components:   TSH 5.314 (*)    All other components within normal limits  POCT I-STAT EG7 - Abnormal; Notable for the following components:   pH, Ven 7.134 (*)    pO2, Ven 194.0 (*)    Bicarbonate 16.9 (*)    TCO2 18 (*)    Acid-base deficit 12.0 (*)    Sodium 128 (*)    Potassium 6.3 (*)    Calcium, Ion 1.11 (*)    HCT 30.0 (*)    Hemoglobin 10.2 (*)    All other components within normal limits  TROPONIN I (HIGH SENSITIVITY) - Abnormal; Notable for the following components:   Troponin I (High Sensitivity) 18 (*)    All other components within normal limits  RESPIRATORY PANEL BY RT PCR (FLU A&B, COVID)  CULTURE, BLOOD (ROUTINE X 2)  CULTURE, BLOOD (ROUTINE X 2)  URINE CULTURE  APTT    PROTIME-INR  VANCOMYCIN, RANDOM  CBC  BASIC METABOLIC PANEL  MAGNESIUM  PHOSPHORUS  I-STAT VENOUS BLOOD GAS, ED  TROPONIN I (HIGH SENSITIVITY)  EKG EKG Interpretation  Date/Time:  Monday January 05 2020 22:01:46 EST Ventricular Rate:  53 PR Interval:    QRS Duration: 91 QT Interval:  448 QTC Calculation: 421 R Axis:   60 Text Interpretation: Sinus rhythm Low voltage, precordial leads Lead(s) aVL were not used for morphology analysis When comapred to prior, similar sinus bradycardia. No sTEMI Confirmed by Antony Blackbird [phone]) on 01/05/2020 10:55:22 PM   Radiology DG Chest Portable 1 View  Result Date: 01/05/2020 CLINICAL DATA:  Acidosis, generalized edema EXAM: PORTABLE CHEST 1 VIEW COMPARISON:  None. FINDINGS: Tracheostomy device is present.  Right PICC line tip overlies SVC. Low lung volumes. Small bilateral pleural effusions and bibasilar atelectasis. Central pulmonary vascular congestion. Cardiomediastinal silhouette is likely within normal limits for portable technique. IMPRESSION: Tracheostomy device and right PICC line. Small bilateral pleural effusions and bibasilar atelectasis. Central pulmonary vascular congestion Electronically Signed   By: Macy Mis M.D.   On: 01/05/2020 21:09    Procedures Procedures (including critical care time)  CRITICAL CARE Performed by: Gwenyth Allegra Martyn Timme Total critical care time: 45 minutes Critical care time was exclusive of separately billable procedures and treating other patients. Critical care was necessary to treat or prevent imminent or life-threatening deterioration. Critical care was time spent personally by me on the following activities: development of treatment plan with patient and/or surrogate as well as nursing, discussions with consultants, evaluation of patient's response to treatment, examination of patient, obtaining history from patient or surrogate, ordering and performing treatments and interventions, ordering and  review of laboratory studies, ordering and review of radiographic studies, pulse oximetry and re-evaluation of patient's condition.   Medications Ordered in ED Medications  norepinephrine (LEVOPHED) 4mg  in 231mL premix infusion (25 mcg/min Intravenous Rate/Dose Change 01/06/20 0015)  ceFEPIme (MAXIPIME) 2 g in sodium chloride 0.9 % 100 mL IVPB (has no administration in time range)  heparin injection 5,000 Units (has no administration in time range)  0.9 %  sodium chloride infusion (has no administration in time range)  sodium bicarbonate 150 mEq in sterile water 1,000 mL infusion (has no administration in time range)  ceFEPIme (MAXIPIME) 2 g in sodium chloride 0.9 % 100 mL IVPB (0 g Intravenous Stopped 01/06/20 0015)  metroNIDAZOLE (FLAGYL) IVPB 500 mg (0 mg Intravenous Stopped 01/06/20 0015)  sodium chloride 0.9 % bolus 1,000 mL (0 mLs Intravenous Stopped 01/05/20 2245)  furosemide (LASIX) injection 80 mg (80 mg Intravenous Given 01/06/20 0111)    ED Course  I have reviewed the triage vital signs and the nursing notes.  Pertinent labs & imaging results that were available during my care of the patient were reviewed by me and considered in my medical decision making (see chart for details).    MDM Rules/Calculators/A&P                      Bailey Cox is a 74 y.o. 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  According to EMS report to nursing, patient was found to have acidosis with a pH of 7.1 and was found to have worsening peripheral edema.  Not much other information was relayed however the packet with the patient reveals that she has had a very complicated last month of  management including an ICU stent for further IV antibiotics with cefepime and vancomycin for tracheitis and meropenem for UTI.  Discussions in this note say that they had discussed stopping renal replacement therapy but then also indicates that family agrees to full measures including transfer to higher level care and dialysis if needed.  Patient was then discharged to [facility] which is where she comes from today.  Patient reports with some communication difficulty that she has been having worsening edema, has not taken dialysis in a long time, she does not know the exact date her last treatment, and reports he is having worsening shortness of breath, chest tightness, cough, and fatigue.  Patient's blood pressure was in the 90s on my initial evaluation and then 82 when I was doing my exam.  Her lungs have rales and rhonchi bilaterally.  Abdomen is nontender on my exam.  Patient has significant peripheral edema in both legs.  Patient's trach appears to be in place without significant erythema surrounding.    Based on the patient's symptoms of worsening edema, this report of acidosis, and her complaints of shortness of breath, chest tightness, fatigue, and worsening edema, I am concerned that patient's kidneys are worsening.  We will get screening labs, work-up for worsened recurrent infection, and will see if she needs emergent dialysis.  EKG does not show peaked T waves.  No STEMI.  Anticipate admission after work-up.  At this time we will hold on fluids as she appears fluid overloaded on exam, will closely watch her blood pressures initially.  Nursing reports that patient is now hypotensive with blood pressure in the 60s.  As she has not hypoxic, we will give some fluids and make her a code sepsis.  Will give broad-spectrum antibiotics.  We will also start pressors.  She was found to be hypothermic, will do bear hugger.  Critical care was called to help manage and admit for further management.  Suspect  sepsis, fluid overload, and hypotension.  Potassium returned normal on the lab assessment instead of the i-STAT.  Will defer to critical care team for decision on if she needs renal replacement therapy tonight.  Pharmacy recommended broad-spectrum antibiotics which were ordered.  Patient admitted for further management.   Final Clinical Impression(s) / ED Diagnoses Final diagnoses:  Peripheral edema  Hypotension, unspecified hypotension type  Sepsis, due to unspecified organism, unspecified whether acute organ dysfunction present ([place])  Hypothermia, initial encounter    Clinical Impression: 1. Peripheral edema   2. Hypotension, unspecified hypotension type   3. Sepsis, due to unspecified organism, unspecified whether acute organ dysfunction present ([place])   4. Hypothermia, initial encounter     Disposition: Admit  This note was prepared with assistance of Dragon voice recognition software. Occasional wrong-word or sound-a-like substitutions may have occurred due to the inherent limitations of voice recognition software.      Taneshia Lorence, Gwenyth Allegra, MD 01/06/20 [phone]

## 2020-01-05 NOTE — ED Notes (Signed)
Bailey Cox POA daughter [id number]

## 2020-01-05 NOTE — ED Triage Notes (Signed)
From [place] taken today showed acidosis and also generalized edema. Pt has long term trach, on 6 L baseline. Dialysis pt, unknown last dialysis appt.

## 2020-01-06 ENCOUNTER — Inpatient Hospital Stay (HOSPITAL_COMMUNITY): Payer: Medicare Other

## 2020-01-06 DIAGNOSIS — J9611 Chronic respiratory failure with hypoxia: Secondary | ICD-10-CM

## 2020-01-06 DIAGNOSIS — R0602 Shortness of breath: Secondary | ICD-10-CM

## 2020-01-06 DIAGNOSIS — I469 Cardiac arrest, cause unspecified: Secondary | ICD-10-CM | POA: Diagnosis not present

## 2020-01-06 DIAGNOSIS — Z930 Tracheostomy status: Secondary | ICD-10-CM

## 2020-01-06 DIAGNOSIS — R9431 Abnormal electrocardiogram [ECG] [EKG]: Secondary | ICD-10-CM

## 2020-01-06 DIAGNOSIS — N19 Unspecified kidney failure: Secondary | ICD-10-CM

## 2020-01-06 LAB — CBC
HCT: 31.4 % — ABNORMAL LOW (ref 36.0–46.0)
Hemoglobin: 9.7 g/dL — ABNORMAL LOW (ref 12.0–15.0)
MCH: 28.2 pg (ref 26.0–34.0)
MCHC: 30.9 g/dL (ref 30.0–36.0)
MCV: 91.3 fL (ref 80.0–100.0)
Platelets: 137 10*3/uL — ABNORMAL LOW (ref 150–400)
RBC: 3.44 MIL/uL — ABNORMAL LOW (ref 3.87–5.11)
RDW: 18.3 % — ABNORMAL HIGH (ref 11.5–15.5)
WBC: 17.1 10*3/uL — ABNORMAL HIGH (ref 4.0–10.5)
nRBC: 0 % (ref 0.0–0.2)

## 2020-01-06 LAB — GLUCOSE, CAPILLARY
Glucose-Capillary: 142 mg/dL — ABNORMAL HIGH (ref 70–99)
Glucose-Capillary: 142 mg/dL — ABNORMAL HIGH (ref 70–99)
Glucose-Capillary: 131 mg/dL — ABNORMAL HIGH (ref 70–99)
Glucose-Capillary: 93 mg/dL (ref 70–99)
Glucose-Capillary: 87 mg/dL (ref 70–99)
Glucose-Capillary: 94 mg/dL (ref 70–99)
Glucose-Capillary: 116 mg/dL — ABNORMAL HIGH (ref 70–99)

## 2020-01-06 LAB — BASIC METABOLIC PANEL
Anion gap: 20 — ABNORMAL HIGH (ref 5–15)
Anion gap: 19 — ABNORMAL HIGH (ref 5–15)
BUN: 131 mg/dL — ABNORMAL HIGH (ref 8–23)
BUN: 133 mg/dL — ABNORMAL HIGH (ref 8–23)
CO2: 14 mmol/L — ABNORMAL LOW (ref 22–32)
CO2: 17 mmol/L — ABNORMAL LOW (ref 22–32)
Calcium: 9.9 mg/dL (ref 8.9–10.3)
Calcium: 9.3 mg/dL (ref 8.9–10.3)
Chloride: 99 mmol/L (ref 98–111)
Chloride: 96 mmol/L — ABNORMAL LOW (ref 98–111)
Creatinine, Ser: 2.46 mg/dL — ABNORMAL HIGH (ref 0.44–1.00)
Creatinine, Ser: 2.6 mg/dL — ABNORMAL HIGH (ref 0.44–1.00)
GFR calc Af Amer: 22 mL/min — ABNORMAL LOW (ref >60)
GFR calc Af Amer: 21 mL/min — ABNORMAL LOW (ref >60)
GFR calc non Af Amer: 19 mL/min — ABNORMAL LOW (ref >60)
GFR calc non Af Amer: 18 mL/min — ABNORMAL LOW (ref >60)
Glucose, Bld: 143 mg/dL — ABNORMAL HIGH (ref 70–99)
Glucose, Bld: 93 mg/dL (ref 70–99)
Potassium: 5.3 mmol/L — ABNORMAL HIGH (ref 3.5–5.1)
Potassium: 4.8 mmol/L (ref 3.5–5.1)
Sodium: 133 mmol/L — ABNORMAL LOW (ref 135–145)
Sodium: 132 mmol/L — ABNORMAL LOW (ref 135–145)

## 2020-01-06 LAB — LACTIC ACID, PLASMA: Lactic Acid, Venous: 0.3 mmol/L — ABNORMAL LOW (ref 0.5–1.9)

## 2020-01-06 LAB — POCT I-STAT EG7
Acid-base deficit: 12 mmol/L — ABNORMAL HIGH (ref 0.0–2.0)
Bicarbonate: 16.9 mmol/L — ABNORMAL LOW (ref 20.0–28.0)
Calcium, Ion: 1.11 mmol/L — ABNORMAL LOW (ref 1.15–1.40)
HCT: 30 % — ABNORMAL LOW (ref 36.0–46.0)
Hemoglobin: 10.2 g/dL — ABNORMAL LOW (ref 12.0–15.0)
O2 Saturation: 99 %
Potassium: 6.3 mmol/L (ref 3.5–5.1)
Sodium: 128 mmol/L — ABNORMAL LOW (ref 135–145)
TCO2: 18 mmol/L — ABNORMAL LOW (ref 22–32)
pCO2, Ven: 50.2 mmHg (ref 44.0–60.0)
pH, Ven: 7.134 — CL (ref 7.250–7.430)
pO2, Ven: 194 mmHg — ABNORMAL HIGH (ref 32.0–45.0)

## 2020-01-06 LAB — BLOOD GAS, VENOUS
Acid-base deficit: 14.5 mmol/L — ABNORMAL HIGH (ref 0.0–2.0)
Bicarbonate: 14.3 mmol/L — ABNORMAL LOW (ref 20.0–28.0)
FIO2: 50
O2 Saturation: 86.7 %
Patient temperature: 35.7
pCO2, Ven: 52 mmHg (ref 44.0–60.0)
pH, Ven: 7.056 — CL (ref 7.250–7.430)
pO2, Ven: 53.9 mmHg — ABNORMAL HIGH (ref 32.0–45.0)

## 2020-01-06 LAB — POCT I-STAT 7, (LYTES, BLD GAS, ICA,H+H)
Acid-base deficit: 8 mmol/L — ABNORMAL HIGH (ref 0.0–2.0)
Bicarbonate: 18.8 mmol/L — ABNORMAL LOW (ref 20.0–28.0)
Calcium, Ion: 1.13 mmol/L — ABNORMAL LOW (ref 1.15–1.40)
HCT: 27 % — ABNORMAL LOW (ref 36.0–46.0)
Hemoglobin: 9.2 g/dL — ABNORMAL LOW (ref 12.0–15.0)
O2 Saturation: 97 %
Patient temperature: 99.7
Potassium: 4.6 mmol/L (ref 3.5–5.1)
Sodium: 132 mmol/L — ABNORMAL LOW (ref 135–145)
TCO2: 20 mmol/L — ABNORMAL LOW (ref 22–32)
pCO2 arterial: 45.8 mmHg (ref 32.0–48.0)
pH, Arterial: 7.225 — ABNORMAL LOW (ref 7.350–7.450)
pO2, Arterial: 115 mmHg — ABNORMAL HIGH (ref 83.0–108.0)

## 2020-01-06 LAB — COOXEMETRY PANEL
Carboxyhemoglobin: 2.3 % — ABNORMAL HIGH (ref 0.5–1.5)
Methemoglobin: 1.4 % (ref 0.0–1.5)
O2 Saturation: 86.4 %
Total hemoglobin: 9.3 g/dL — ABNORMAL LOW (ref 12.0–16.0)

## 2020-01-06 LAB — MAGNESIUM
Magnesium: 2.4 mg/dL (ref 1.7–2.4)
Magnesium: 2.2 mg/dL (ref 1.7–2.4)

## 2020-01-06 LAB — PHOSPHORUS
Phosphorus: 8.8 mg/dL — ABNORMAL HIGH (ref 2.5–4.6)
Phosphorus: 8.2 mg/dL — ABNORMAL HIGH (ref 2.5–4.6)

## 2020-01-06 LAB — VANCOMYCIN, RANDOM: Vancomycin Rm: 34

## 2020-01-06 LAB — ECHOCARDIOGRAM COMPLETE
Height: 64 in
Weight: 3985.92 oz

## 2020-01-06 LAB — HEMOGLOBIN A1C
Hgb A1c MFr Bld: 5.7 % — ABNORMAL HIGH (ref 4.8–5.6)
Mean Plasma Glucose: 116.89 mg/dL

## 2020-01-06 LAB — TROPONIN I (HIGH SENSITIVITY): Troponin I (High Sensitivity): 18 ng/L — ABNORMAL HIGH (ref <18)

## 2020-01-06 LAB — MRSA PCR SCREENING: MRSA by PCR: POSITIVE — AB

## 2020-01-06 MED ORDER — ORAL CARE MOUTH RINSE
15.0000 mL | OROMUCOSAL | Status: DC
  Administered 2020-01-06 – 2020-02-04 (×215): 15 mL via OROMUCOSAL

## 2020-01-06 MED ORDER — STERILE WATER FOR INJECTION IV SOLN
INTRAVENOUS | Status: DC
  Filled 2020-01-06 (×4): qty 850

## 2020-01-06 MED ORDER — FUROSEMIDE 10 MG/ML IJ SOLN
80.0000 mg | Freq: Once | INTRAMUSCULAR | Status: AC
  Administered 2020-01-06: 80 mg via INTRAVENOUS
  Filled 2020-01-06: qty 8

## 2020-01-06 MED ORDER — CHLORHEXIDINE GLUCONATE CLOTH 2 % EX PADS
6.0000 | MEDICATED_PAD | Freq: Every day | CUTANEOUS | Status: AC
  Administered 2020-01-08 – 2020-01-11 (×2): 6 via TOPICAL

## 2020-01-06 MED ORDER — SODIUM CHLORIDE 0.9 % IV SOLN: INTRAVENOUS | Status: DC | PRN

## 2020-01-06 MED ORDER — HYDROCORTISONE NA SUCCINATE PF 100 MG IJ SOLR
50.0000 mg | Freq: Once | INTRAMUSCULAR | Status: AC
  Administered 2020-01-06: 50 mg via INTRAVENOUS
  Filled 2020-01-06: qty 2

## 2020-01-06 MED ORDER — VITAL HIGH PROTEIN PO LIQD
1000.0000 mL | ORAL | Status: DC
  Administered 2020-01-06: 1000 mL

## 2020-01-06 MED ORDER — PRO-STAT SUGAR FREE PO LIQD
30.0000 mL | Freq: Two times a day (BID) | ORAL | Status: DC
  Administered 2020-01-06 – 2020-01-07 (×3): 30 mL
  Filled 2020-01-06 (×3): qty 30

## 2020-01-06 MED ORDER — CHLORHEXIDINE GLUCONATE CLOTH 2 % EX PADS
6.0000 | MEDICATED_PAD | Freq: Every day | CUTANEOUS | Status: DC
  Administered 2020-01-06 – 2020-02-04 (×26): 6 via TOPICAL

## 2020-01-06 MED ORDER — INSULIN ASPART 100 UNIT/ML ~~LOC~~ SOLN
0.0000 [IU] | SUBCUTANEOUS | Status: DC
  Administered 2020-01-06 – 2020-01-08 (×8): 2 [IU] via SUBCUTANEOUS
  Administered 2020-01-08: 3 [IU] via SUBCUTANEOUS
  Administered 2020-01-08 – 2020-01-10 (×4): 2 [IU] via SUBCUTANEOUS
  Administered 2020-01-10: 3 [IU] via SUBCUTANEOUS
  Administered 2020-01-10: 2 [IU] via SUBCUTANEOUS
  Administered 2020-01-10: 3 [IU] via SUBCUTANEOUS
  Administered 2020-01-10: 2 [IU] via SUBCUTANEOUS
  Administered 2020-01-11: 3 [IU] via SUBCUTANEOUS
  Administered 2020-01-11: 2 [IU] via SUBCUTANEOUS
  Administered 2020-01-11 (×2): 3 [IU] via SUBCUTANEOUS
  Administered 2020-01-12 (×2): 2 [IU] via SUBCUTANEOUS
  Administered 2020-01-12 – 2020-01-13 (×3): 3 [IU] via SUBCUTANEOUS
  Administered 2020-01-13: 2 [IU] via SUBCUTANEOUS
  Administered 2020-01-13: 3 [IU] via SUBCUTANEOUS
  Administered 2020-01-13: 2 [IU] via SUBCUTANEOUS
  Administered 2020-01-14: 3 [IU] via SUBCUTANEOUS
  Administered 2020-01-14 – 2020-01-17 (×11): 2 [IU] via SUBCUTANEOUS
  Administered 2020-01-17 (×2): 3 [IU] via SUBCUTANEOUS
  Administered 2020-01-17 – 2020-01-18 (×2): 2 [IU] via SUBCUTANEOUS
  Administered 2020-01-18: 3 [IU] via SUBCUTANEOUS
  Administered 2020-01-19: 2 [IU] via SUBCUTANEOUS
  Administered 2020-01-19 (×4): 3 [IU] via SUBCUTANEOUS
  Administered 2020-01-19: 2 [IU] via SUBCUTANEOUS
  Administered 2020-01-20: 3 [IU] via SUBCUTANEOUS
  Administered 2020-01-20 – 2020-01-27 (×25): 2 [IU] via SUBCUTANEOUS
  Administered 2020-01-28: 3 [IU] via SUBCUTANEOUS
  Administered 2020-01-28 – 2020-02-02 (×23): 2 [IU] via SUBCUTANEOUS
  Administered 2020-02-02: 3 [IU] via SUBCUTANEOUS
  Administered 2020-02-02 (×2): 2 [IU] via SUBCUTANEOUS
  Administered 2020-02-03: 3 [IU] via SUBCUTANEOUS
  Administered 2020-02-03 (×2): 2 [IU] via SUBCUTANEOUS
  Administered 2020-02-03: 3 [IU] via SUBCUTANEOUS
  Administered 2020-02-04 (×3): 2 [IU] via SUBCUTANEOUS
  Administered 2020-02-04: 3 [IU] via SUBCUTANEOUS
  Administered 2020-02-04 – 2020-02-05 (×5): 2 [IU] via SUBCUTANEOUS
  Administered 2020-02-05: 3 [IU] via SUBCUTANEOUS
  Administered 2020-02-05: 2 [IU] via SUBCUTANEOUS
  Administered 2020-02-06: 3 [IU] via SUBCUTANEOUS
  Administered 2020-02-06 (×4): 2 [IU] via SUBCUTANEOUS
  Administered 2020-02-07: 3 [IU] via SUBCUTANEOUS
  Administered 2020-02-07 (×2): 2 [IU] via SUBCUTANEOUS
  Administered 2020-02-07 (×2): 3 [IU] via SUBCUTANEOUS
  Administered 2020-02-08: 2 [IU] via SUBCUTANEOUS
  Administered 2020-02-08: 3 [IU] via SUBCUTANEOUS
  Administered 2020-02-08 – 2020-02-09 (×8): 2 [IU] via SUBCUTANEOUS
  Administered 2020-02-09: 3 [IU] via SUBCUTANEOUS
  Administered 2020-02-10 (×4): 2 [IU] via SUBCUTANEOUS
  Administered 2020-02-10: 3 [IU] via SUBCUTANEOUS
  Administered 2020-02-10 – 2020-02-11 (×3): 2 [IU] via SUBCUTANEOUS
  Administered 2020-02-11: 3 [IU] via SUBCUTANEOUS
  Administered 2020-02-11 (×2): 2 [IU] via SUBCUTANEOUS
  Administered 2020-02-12: 3 [IU] via SUBCUTANEOUS
  Administered 2020-02-12: 18:00:00 2 [IU] via SUBCUTANEOUS
  Administered 2020-02-12: 3 [IU] via SUBCUTANEOUS
  Administered 2020-02-13 – 2020-02-14 (×7): 2 [IU] via SUBCUTANEOUS
  Administered 2020-02-14: 3 [IU] via SUBCUTANEOUS
  Administered 2020-02-15 (×2): 2 [IU] via SUBCUTANEOUS
  Administered 2020-02-15: 13:00:00 3 [IU] via SUBCUTANEOUS
  Administered 2020-02-15: 2 [IU] via SUBCUTANEOUS
  Administered 2020-02-16: 17:00:00 3 [IU] via SUBCUTANEOUS
  Administered 2020-02-16 – 2020-02-18 (×5): 2 [IU] via SUBCUTANEOUS
  Administered 2020-02-18: 3 [IU] via SUBCUTANEOUS
  Administered 2020-02-19 (×3): 2 [IU] via SUBCUTANEOUS
  Administered 2020-02-19 – 2020-02-20 (×2): 3 [IU] via SUBCUTANEOUS
  Administered 2020-02-20: 5 [IU] via SUBCUTANEOUS
  Administered 2020-02-20: 3 [IU] via SUBCUTANEOUS
  Administered 2020-02-20: 5 [IU] via SUBCUTANEOUS
  Administered 2020-02-20: 2 [IU] via SUBCUTANEOUS
  Administered 2020-02-20 – 2020-02-21 (×2): 3 [IU] via SUBCUTANEOUS
  Administered 2020-02-21: 5 [IU] via SUBCUTANEOUS
  Administered 2020-02-21: 3 [IU] via SUBCUTANEOUS
  Administered 2020-02-21 (×2): 5 [IU] via SUBCUTANEOUS
  Administered 2020-02-21: 2 [IU] via SUBCUTANEOUS
  Administered 2020-02-22: 3 [IU] via SUBCUTANEOUS
  Administered 2020-02-22: 5 [IU] via SUBCUTANEOUS
  Administered 2020-02-22: 2 [IU] via SUBCUTANEOUS
  Administered 2020-02-22: 3 [IU] via SUBCUTANEOUS
  Administered 2020-02-22: 5 [IU] via SUBCUTANEOUS
  Administered 2020-02-22 – 2020-02-24 (×6): 3 [IU] via SUBCUTANEOUS
  Administered 2020-02-24 (×2): 2 [IU] via SUBCUTANEOUS
  Administered 2020-02-24: 3 [IU] via SUBCUTANEOUS
  Administered 2020-02-24: 2 [IU] via SUBCUTANEOUS
  Administered 2020-02-24 – 2020-02-25 (×4): 3 [IU] via SUBCUTANEOUS
  Administered 2020-02-25 – 2020-02-29 (×9): 2 [IU] via SUBCUTANEOUS
  Administered 2020-03-01 – 2020-03-02 (×2): 3 [IU] via SUBCUTANEOUS
  Administered 2020-03-02 – 2020-03-08 (×9): 2 [IU] via SUBCUTANEOUS

## 2020-01-06 MED ORDER — CHLORHEXIDINE GLUCONATE 0.12% ORAL RINSE (MEDLINE KIT)
15.0000 mL | Freq: Two times a day (BID) | OROMUCOSAL | Status: DC
  Administered 2020-01-06 – 2020-03-21 (×102): 15 mL via OROMUCOSAL

## 2020-01-06 MED ORDER — MUPIROCIN 2 % EX OINT
1.0000 "application " | TOPICAL_OINTMENT | Freq: Two times a day (BID) | CUTANEOUS | Status: AC
  Administered 2020-01-06 – 2020-01-11 (×10): 1 via NASAL
  Filled 2020-01-06: qty 22

## 2020-01-06 MED ORDER — FUROSEMIDE 10 MG/ML IJ SOLN
100.0000 mg | Freq: Once | INTRAVENOUS | Status: AC
  Administered 2020-01-06: 100 mg via INTRAVENOUS
  Filled 2020-01-06: qty 10

## 2020-01-06 MED ORDER — SODIUM CHLORIDE 0.9% FLUSH
10.0000 mL | Freq: Two times a day (BID) | INTRAVENOUS | Status: DC
  Administered 2020-01-06 – 2020-01-12 (×10): 10 mL
  Administered 2020-01-13: 20 mL
  Administered 2020-01-13: 10 mL
  Administered 2020-01-14 – 2020-01-15 (×4): 20 mL
  Administered 2020-01-16 – 2020-01-17 (×4): 10 mL
  Administered 2020-01-17: 20 mL
  Administered 2020-01-18 – 2020-03-02 (×47): 10 mL
  Administered 2020-03-02: 40 mL
  Administered 2020-03-05 – 2020-03-19 (×22): 10 mL
  Administered 2020-03-20: 20 mL
  Administered 2020-03-20 – 2020-03-21 (×2): 10 mL

## 2020-01-06 MED ORDER — SODIUM CHLORIDE 0.9% FLUSH
10.0000 mL | INTRAVENOUS | Status: DC | PRN
  Administered 2020-01-23 – 2020-02-03 (×2): 10 mL
  Administered 2020-02-11: 14:00:00 20 mL
  Administered 2020-03-02: 10 mL

## 2020-01-06 MED ORDER — CHLORHEXIDINE GLUCONATE CLOTH 2 % EX PADS: 6.0000 | MEDICATED_PAD | Freq: Every day | CUTANEOUS | Status: DC

## 2020-01-06 MED ORDER — PERFLUTREN LIPID MICROSPHERE
1.0000 mL | INTRAVENOUS | Status: AC | PRN
  Administered 2020-01-06: 4 mL via INTRAVENOUS
  Filled 2020-01-06: qty 10

## 2020-01-06 MED ORDER — GENERIC EXTERNAL MEDICATION
Status: DC
Start: ? — End: 2020-01-06

## 2020-01-06 MED ORDER — SODIUM BICARBONATE 8.4 % IV SOLN
100.0000 meq | Freq: Once | INTRAVENOUS | Status: AC
  Administered 2020-01-06: 100 meq via INTRAVENOUS
  Filled 2020-01-06: qty 100

## 2020-01-06 NOTE — H&P (Signed)
NAME:  Bailey Cox, MRN:  [id number], DOB:  21-May-1946, LOS: 1 ADMISSION DATE:  01/05/2020, CONSULTATION DATE:  01/06/20 REFERRING MD:  Tegeler EDP, CHIEF COMPLAINT:   Shock  Brief History   74 year old female admitted 3/1 from [place] where she had just arrived after long admission to [facility] where she suffered PEA arrest during endoscopy and ended up requiring tracheostomy. Presents to [facility] ED with widespread peripheral edema and acidosis.   History of present illness   74 year old female with PMH as below, which is significant for long admission in the [facility] system from 10/2019 all the way through 01/05/2020.  She was originally hospitalized in September 2020 with acute cholecystitis and underwent a cholecystectomy.  She was then sent to SNF for short duration but had return to [facility] to have a bile duct stent placed ultimately returning to SNF.  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  While hospitalized she developed fluid overload and was ultimately transferred to [facility] for dialysis.  Her course after that point become somewhat unclear, but as described by her family she was on and off the ventilator for several procedures and was eventually left intubated.  One of these procedures was an endoscopy during which she unfortunately suffered a cardiac arrest.  Details of the arrest are unclear.  She remained on the ventilator and ultimately underwent tracheostomy.  Her hospital course was also complicated by MRSA pneumonia.  She was ultimately able to come off of hemodialysis and was treated intermittently with diuresis.  It sounds like she bounced in and out of the ICU a few times with volume overload.  On the tail end of her admission she still struggled with kidney disease and was being treated for a urinary tract infection with cefepime and vancomycin.  Plans were to stop on 3/2.  She was discharged from the [facility] system on 3/1 to  [facility] in [place].  Upon arrival to [facility] they deemed her too sick for admission and transferred her to [facility] emergency department with complaints of volume overload and acidosis.   Past Medical History   has a past medical history of Chronic respiratory failure ([place]), CKD (chronic kidney disease), DM (diabetes mellitus) ([place]), GERD (gastroesophageal reflux disease), HTN (hypertension), Ogilvie syndrome, Tracheostomy status ([place]), and Vascular dementia ([place]).   Significant Hospital Events   07/2019 admit to [facility] for cholecystitis  09/2019 tx to [facility] for renal failure/volume overload. HD started.  10/2019 PEA arrest during EGD . Intubated 11/2019 Trach. HD stopped.   Consults:    Procedures:  PICC pre admit > Trach pre admit >  Significant Diagnostic Tests:   Echo 10/24/2019 > LVEF 55%, Grade 1 DD. RV systolic function normal.   Micro Data:  Blood 3/2 Urine 3/2  Antimicrobials:  Cefepime Pre hosp (stop date 3/2)  Vancomycin pre hosp (stop date 3/2)   Interim history/subjective:  Patient awake and interactive. Denies specific complaints. Improvement following diuresis.   Objective   Blood pressure (!) 118/42, pulse 81, temperature 99.7 F (37.6 C), resp. rate (!) 9, height 5\' 4"  (1.626 m), weight 113 kg, SpO2 99 %.    Vent Mode: SIMV;PRVC;PSV FiO2 (%):  [50 %-60 %] 50 % Set Rate:  [16 bmp] 16 bmp Vt Set:  [360 mL] 360 mL PEEP:  [5 cmH20] 5 cmH20 Pressure Support:  [20 cmH20] 20 cmH20 Plateau Pressure:  [22 cmH20] 22 cmH20   Intake/Output  Summary (Last 24 hours) at 01/06/2020 1357 Last data filed at 01/06/2020 1200 Gross per 24 hour  Intake 2918.97 ml  Output 605 ml  Net 2313.97 ml   Filed Weights   01/05/20 1934  Weight: 113 kg    Examination: General: Obese female with trach HENT: [place]/AT, PERRL, no JVD Lungs: Clear bilateral breath sounds Cardiovascular: RRR, no MRG. Marked peripheral edema.  Abdomen: Soft, non-tender,  non-distended. Extremities: 4+ pitting edema.  Neuro: Alert, oriented to self. Follows commands. Answers yes/no. Reports this is near her recent baseline.  GU: Foley  Resolved Hospital Problem list     Assessment & Plan:   Critically ill due to cardiogenic shock requiring titration of NE.  Severely volume overloaded on exam - Continue levophed titrated to MAP goal 70mmHg  Acute kidney injury on CKD: was on HD acutely in late 2020. Was stopped, but it seems as though her renal function has been fluctuating since that time prompting many ICU transfers for agressive diuresis.  - Diuresis. - Nephrology following  Acidosis: mixed metabolic and respiratory: metabolic component likely uremic.  - start bicarb infusion - may need vent to help compensate.   HFpEF: Most recent echo with hyperdynamic LV  UTI: treatment started at [facility] with cefepime, vanco on 2/25. Stop date 3/2 - Continue cefepime, DC vanco given renal insult.  - Urine culture  Tracheostomy status - Currently doing OK on 6L trach collar. Cuffed trach, cuff down.  - Family OK with vent if necessary.  - If repeat ABG not improved will probably need to go on vent.   DM:  CBG monitoring and SSI  Elevated lipase: biliary stent removed in December - trend lipase - hold off on imaging for now  Best practice:  Diet: NPO Pain/Anxiety/Delirium protocol (if indicated): NA VAP protocol (if indicated): NA DVT prophylaxis: heparin SQ GI prophylaxis: NA Glucose control: SSI Mobility: BR Code Status: FULL Family Communication: Daughter and POA updated. Endorse full scope of treatment. They are unhappy with her course so far. She has been hospitalized since September.  Disposition: ICU, critically ill.   Labs   CBC: Recent Labs  Lab 01/05/20 2004 01/05/20 2030 01/06/20 0406 01/06/20 1305  WBC 9.3  --  17.1*  --   NEUTROABS 7.9*  --   --   --   HGB 9.4* 10.2* 9.7* 9.2*  HCT 30.9* 30.0* 31.4* 27.0*  MCV 93.1  --   91.3  --   PLT 89*  --  137*  --     Basic Metabolic Panel: Recent Labs  Lab 01/05/20 2004 01/05/20 2030 01/06/20 0406 01/06/20 1305  NA 132* 128* 133* 132*  K 4.9 6.3* 5.3* 4.6  CL 101  --  99  --   CO2 11*  --  14*  --   GLUCOSE 113*  --  143*  --   BUN 128*  --  131*  --   CREATININE 2.47*  --  2.46*  --   CALCIUM 9.7  --  9.9  --   MG  --   --  2.4  --   PHOS  --   --  8.8*  --    GFR: Estimated Creatinine Clearance: 25.8 mL/min (A) (by C-G formula based on SCr of 2.46 mg/dL (H)). Recent Labs  Lab 01/05/20 2004 01/06/20 0043 01/06/20 0406  WBC 9.3  --  17.1*  LATICACIDVEN 0.3* 0.3*  --     Liver Function Tests: Recent Labs  Lab 01/05/20  2004  AST 25  ALT 22  ALKPHOS 177*  BILITOT QUANTITY NOT SUFFICIENT, UNABLE TO PERFORM TEST  PROT 5.8*  ALBUMIN 2.4*   Recent Labs  Lab 01/05/20 2004  LIPASE 90*   No results for input(s): AMMONIA in the last 168 hours.  ABG    Component Value Date/Time   PHART 7.225 (L) 01/06/2020 1305   PCO2ART 45.8 01/06/2020 1305   PO2ART 115.0 (H) 01/06/2020 1305   HCO3 18.8 (L) 01/06/2020 1305   TCO2 20 (L) 01/06/2020 1305   ACIDBASEDEF 8.0 (H) 01/06/2020 1305   O2SAT 97.0 01/06/2020 1305     Coagulation Profile: Recent Labs  Lab 01/05/20 2004  INR 1.1    Cardiac Enzymes: No results for input(s): CKTOTAL, CKMB, CKMBINDEX, TROPONINI in the last 168 hours.  HbA1C: Hgb A1c MFr Bld  Date/Time Value Ref Range Status  01/06/2020 02:30 AM 5.7 (H) 4.8 - 5.6 % Final    Comment:    (NOTE) Pre diabetes:          5.7%-6.4% Diabetes:              >6.4% Glycemic control for   <7.0% adults with diabetes     CBG: Recent Labs  Lab 01/06/20 0233 01/06/20 0353 01/06/20 0740 01/06/20 1206  GLUCAP 142* 142* 131* 93   CRITICAL CARE Performed by: Kipp Brood   Total critical care time: 35 minutes  Critical care time was exclusive of separately billable procedures and treating other patients.  Critical care  was necessary to treat or prevent imminent or life-threatening deterioration.  Critical care was time spent personally by me on the following activities: development of treatment plan with patient and/or surrogate as well as nursing, discussions with consultants, evaluation of patient's response to treatment, examination of patient, obtaining history from patient or surrogate, ordering and performing treatments and interventions, ordering and review of laboratory studies, ordering and review of radiographic studies, pulse oximetry, re-evaluation of patient's condition and participation in multidisciplinary rounds.  Kipp Brood, MD [facility] ICU Physician [place]  Pager: [phone] Mobile: [phone] After hours: [phone].   01/06/2020 1:57 PM

## 2020-01-06 NOTE — Consult Note (Addendum)
Reason for Consult: Renal failure Referring Physician:  Georgann Housekeeper  Chief Complaint: Hypotension  Assessment/Plan: 1. AKI on CKD with most likely cause of the AKI being ATN from sepsis.  - She is not a good candidate for dialysis and hopefully we do not need to make that decision.  - Strict I&O's and currently she is still making urine. - Will send urine studies as well as cultures. - Continue HCO3 gtt + Lasix gtt if needed to manage the acidosis and vol status. - Lokelma x1 - Lasix gtt - [name] [phone]  and [name] [phone]; both have POA. 2. Sepsis - on pressors and broad spectrum abx. Reculturing. 3. ?urosepsis - will recheck urinalysis microscopy and culture, cefepime vanc on 2/25 to 3/2 -> stopped the vanco w/ worsening renal function. 4. H/o PEA arrest 5. MRSA PNA on vanc/cefepime -> cefepime 6. Respiratory failure on trach 7. HFpEF   HPI: Bailey Cox is an 74 y.o. 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 She had a PEA arrest during EGD in early December with duodenal ulcer noted on EGD. She has been having problems weaning from the vent with intermittent hyperkalemia treatetd medically w/ Kayexalate and now recently w/ worsening renal function over the past few days prior to admission to [facility]. She actually had a foley placed 12/28/19 when the renal function was worsening with reutrn of clear purulence and then started on Meropenem. Per review of chart the pt did not want RRT but after urging by family agreed to it. She was also started on Cefepime and Vanco for tracheitis in Feb.   ROS Pertinent items are noted in HPI.  Chemistry and CBC: Creatinine, Ser  Date/Time Value Ref Range Status  01/06/2020 04:06 AM 2.46 (H) 0.44 - 1.00 mg/dL Final  01/05/2020 08:04 PM 2.47 (H) 0.44 - 1.00 mg/dL  Final   Recent Labs  Lab 01/05/20 2004 01/05/20 2030 01/06/20 0406  NA 132* 128* 133*  K 4.9 6.3* 5.3*  CL 101  --  99  CO2 11*  --  14*  GLUCOSE 113*  --  143*  BUN 128*  --  131*  CREATININE 2.47*  --  2.46*  CALCIUM 9.7  --  9.9  PHOS  --   --  8.8*   Recent Labs  Lab 01/05/20 2004 01/05/20 2030 01/06/20 0406  WBC 9.3  --  17.1*  NEUTROABS 7.9*  --   --   HGB 9.4* 10.2* 9.7*  HCT 30.9* 30.0* 31.4*  MCV 93.1  --  91.3  PLT 89*  --  137*   Liver Function Tests: Recent Labs  Lab 01/05/20 2004  AST 25  ALT 22  ALKPHOS 177*  BILITOT QUANTITY NOT SUFFICIENT, UNABLE TO PERFORM TEST  PROT 5.8*  ALBUMIN 2.4*   Recent Labs  Lab 01/05/20 2004  LIPASE 90*   No results for input(s): AMMONIA in the last 168 hours. Cardiac Enzymes: No results for input(s): CKTOTAL, CKMB, CKMBINDEX, TROPONINI in the last 168 hours. Iron Studies: No results for input(s): IRON, TIBC, TRANSFERRIN, FERRITIN in the last 72 hours. PT/INR: '@LABRCNTIP'$ (inr:5)  Xrays/Other Studies: ) Results for orders placed or performed during the hospital encounter of 01/05/20 (from the past 48 hour(s))  CBC with Differential     Status: Abnormal   Collection Time: 01/05/20  8:04 PM  Result  Value Ref Range   WBC 9.3 4.0 - 10.5 K/uL   RBC 3.32 (L) 3.87 - 5.11 MIL/uL   Hemoglobin 9.4 (L) 12.0 - 15.0 g/dL   HCT 30.9 (L) 36.0 - 46.0 %   MCV 93.1 80.0 - 100.0 fL   MCH 28.3 26.0 - 34.0 pg   MCHC 30.4 30.0 - 36.0 g/dL   RDW 18.3 (H) 11.5 - 15.5 %   Platelets 89 (L) 150 - 400 K/uL    Comment: REPEATED TO VERIFY PLATELET COUNT CONFIRMED BY SMEAR SPECIMEN CHECKED FOR CLOTS Immature Platelet Fraction may be clinically indicated, consider ordering this additional test [id number]    nRBC 0.0 0.0 - 0.2 %   Neutrophils Relative % 85 %   Neutro Abs 7.9 (H) 1.7 - 7.7 K/uL   Lymphocytes Relative 7 %   Lymphs Abs 0.7 0.7 - 4.0 K/uL   Monocytes Relative 6 %   Monocytes Absolute 0.6 0.1 - 1.0 K/uL   Eosinophils  Relative 1 %   Eosinophils Absolute 0.1 0.0 - 0.5 K/uL   Basophils Relative 0 %   Basophils Absolute 0.0 0.0 - 0.1 K/uL   Immature Granulocytes 1 %   Abs Immature Granulocytes 0.08 (H) 0.00 - 0.07 K/uL    Comment: Performed at [facility], [address] [place], [place] [address]  Comprehensive metabolic panel     Status: Abnormal   Collection Time: 01/05/20  8:04 PM  Result Value Ref Range   Sodium 132 (L) 135 - 145 mmol/L   Potassium 4.9 3.5 - 5.1 mmol/L   Chloride 101 98 - 111 mmol/L   CO2 11 (L) 22 - 32 mmol/L   Glucose, Bld 113 (H) 70 - 99 mg/dL    Comment: Glucose reference range applies only to samples taken after fasting for at least 8 hours.   BUN 128 (H) 8 - 23 mg/dL   Creatinine, Ser 2.47 (H) 0.44 - 1.00 mg/dL   Calcium 9.7 8.9 - 10.3 mg/dL   Total Protein 5.8 (L) 6.5 - 8.1 g/dL   Albumin 2.4 (L) 3.5 - 5.0 g/dL   AST 25 15 - 41 U/L   ALT 22 0 - 44 U/L   Alkaline Phosphatase 177 (H) 38 - 126 U/L   Total Bilirubin QUANTITY NOT SUFFICIENT, UNABLE TO PERFORM TEST 0.3 - 1.2 mg/dL   GFR calc non Af Amer 19 (L) >60 mL/min   GFR calc Af Amer 22 (L) >60 mL/min   Anion gap 20 (H) 5 - 15    Comment: Performed at [facility] [address] [place], [place] [address]  Lactic acid, plasma     Status: Abnormal   Collection Time: 01/05/20  8:04 PM  Result Value Ref Range   Lactic Acid, Venous 0.3 (L) 0.5 - 1.9 mmol/L    Comment: Performed at [facility] [address], [place], [place] [address]  Lipase, blood     Status: Abnormal   Collection Time: 01/05/20  8:04 PM  Result Value Ref Range   Lipase 90 (H) 11 - 51 U/L    Comment: Performed at [facility] [address] [place], [place] [address]  Troponin I (High Sensitivity)     Status: None   Collection Time: 01/05/20  8:04 PM  Result Value Ref Range   Troponin I (High Sensitivity) 15 <18 ng/L    Comment: (NOTE) Elevated high sensitivity troponin I (hsTnI) values and significant  changes  across  serial measurements may suggest ACS but many other  chronic and acute conditions are known to elevate hsTnI results.  Refer to the Links section for chest pain algorithms and additional  guidance. Performed at [facility], [place] [address] [place], [place] [address]   Brain natriuretic peptide     Status: Abnormal   Collection Time: 01/05/20  8:04 PM  Result Value Ref Range   B Natriuretic Peptide 1,080.6 (H) 0.0 - 100.0 pg/mL    Comment: Performed at [facility] [address], [place], [place] [address]  Blood culture (routine x 2)     Status: None (Preliminary result)   Collection Time: 01/05/20  8:04 PM   Specimen: BLOOD  Result Value Ref Range   Specimen Description BLOOD PICC LINE    Special Requests      BOTTLES DRAWN AEROBIC AND ANAEROBIC Blood Culture adequate volume   Culture      NO GROWTH < 12 HOURS Performed at [facility], [place] [address] [place], [place] [address]    Report Status PENDING   Blood culture (routine x 2)     Status: None (Preliminary result)   Collection Time: 01/05/20  8:04 PM   Specimen: BLOOD RIGHT HAND  Result Value Ref Range   Specimen Description BLOOD RIGHT HAND    Special Requests      BOTTLES DRAWN AEROBIC AND ANAEROBIC Blood Culture adequate volume   Culture      NO GROWTH < 12 HOURS Performed at [facility], [place] [address], [place], [place] [address]    Report Status PENDING   Urinalysis, Routine w reflex microscopic     Status: Abnormal   Collection Time: 01/05/20  8:04 PM  Result Value Ref Range   Color, Urine YELLOW YELLOW   APPearance CLOUDY (A) CLEAR   Specific Gravity, Urine 1.018 1.005 - 1.030   pH 5.0 5.0 - 8.0   Glucose, UA NEGATIVE NEGATIVE mg/dL   Hgb urine dipstick SMALL (A) NEGATIVE   Bilirubin Urine NEGATIVE NEGATIVE   Ketones, ur 5 (A) NEGATIVE mg/dL   Protein, ur 100 (A) NEGATIVE mg/dL   Nitrite NEGATIVE NEGATIVE   Leukocytes,Ua LARGE (A) NEGATIVE   RBC / HPF 11-20 0 - 5  RBC/hpf   WBC, UA >50 (H) 0 - 5 WBC/hpf   Bacteria, UA RARE (A) NONE SEEN   Squamous Epithelial / LPF 0-5 0 - 5   WBC Clumps PRESENT    Mucus PRESENT    Budding Yeast PRESENT    Hyaline Casts, UA PRESENT    Non Squamous Epithelial 0-5 (A) NONE SEEN    Comment: Performed at [facility], [place] [address] [place], [place] [address]  Respiratory Panel by RT PCR (Flu A&B, Covid) - Nasopharyngeal Swab     Status: None   Collection Time: 01/05/20  8:04 PM   Specimen: Nasopharyngeal Swab  Result Value Ref Range   SARS Coronavirus 2 by RT PCR NEGATIVE NEGATIVE    Comment: (NOTE) SARS-CoV-2 target nucleic acids are NOT DETECTED. The SARS-CoV-2 RNA is generally detectable in upper respiratoy specimens during the acute phase of infection. The lowest concentration of SARS-CoV-2 viral copies this assay can detect is 131 copies/mL. A negative result does not preclude SARS-Cov-2 infection and should not be used as the sole basis for treatment or other patient management decisions. A negative result may occur with  improper specimen collection/handling, submission of specimen other than nasopharyngeal swab, presence  of viral mutation(s) within the areas targeted by this assay, and inadequate number of viral copies (<131 copies/mL). A negative result must be combined with clinical observations, patient history, and epidemiological information. The expected result is Negative. Fact Sheet for Patients:  PinkCheek.be Fact Sheet for Healthcare Providers:  GravelBags.it This test is not yet ap proved or cleared by the [place] FDA and  has been authorized for detection and/or diagnosis of SARS-CoV-2 by FDA under an Emergency Use Authorization (EUA). This EUA will remain  in effect (meaning this test can be used) for the duration of the COVID-19 declaration under Section 564(b)(1) of the Act, 21 U.S.C. section 360bbb-3(b)(1), unless  the authorization is terminated or revoked sooner.    Influenza A by PCR NEGATIVE NEGATIVE   Influenza B by PCR NEGATIVE NEGATIVE    Comment: (NOTE) The Xpert Xpress SARS-CoV-2/FLU/RSV assay is intended as an aid in  the diagnosis of influenza from Nasopharyngeal swab specimens and  should not be used as a sole basis for treatment. Nasal washings and  aspirates are unacceptable for Xpert Xpress SARS-CoV-2/FLU/RSV  testing. Fact Sheet for Patients: PinkCheek.be Fact Sheet for Healthcare Providers: GravelBags.it This test is not yet approved or cleared by the [place] FDA and  has been authorized for detection and/or diagnosis of SARS-CoV-2 by  FDA under an Emergency Use Authorization (EUA). This EUA will remain  in effect (meaning this test can be used) for the duration of the  Covid-19 declaration under Section 564(b)(1) of the Act, 21  U.S.C. section 360bbb-3(b)(1), unless the authorization is  terminated or revoked. Performed at [facility], [place] [address], [place], [place] [address]   TSH     Status: Abnormal   Collection Time: 01/05/20  8:04 PM  Result Value Ref Range   TSH 5.314 (H) 0.350 - 4.500 uIU/mL    Comment: Performed by a 3rd Generation assay with a functional sensitivity of <=0.01 uIU/mL. Performed at [facility], [place] [address], [place], [place] [address]   APTT     Status: None   Collection Time: 01/05/20  8:04 PM  Result Value Ref Range   aPTT 34 24 - 36 seconds    Comment: Performed at [facility] [address] [place], [place] [address]  Protime-INR     Status: None   Collection Time: 01/05/20  8:04 PM  Result Value Ref Range   Prothrombin Time 14.3 11.4 - 15.2 seconds   INR 1.1 0.8 - 1.2    Comment: (NOTE) INR goal varies based on device and disease states. Performed at [facility], [place] [address] [place], [place] [address]   POCT I-Stat EG7     Status:  Abnormal   Collection Time: 01/05/20  8:30 PM  Result Value Ref Range   pH, Ven 7.134 (LL) 7.250 - 7.430   pCO2, Ven 50.2 44.0 - 60.0 mmHg   pO2, Ven 194.0 (H) 32.0 - 45.0 mmHg   Bicarbonate 16.9 (L) 20.0 - 28.0 mmol/L   TCO2 18 (L) 22 - 32 mmol/L   O2 Saturation 99.0 %   Acid-base deficit 12.0 (H) 0.0 - 2.0 mmol/L   Sodium 128 (L) 135 - 145 mmol/L   Potassium 6.3 (HH) 3.5 - 5.1 mmol/L    Comment: SPECIMEN HEMOLYZED. HEMOLYSIS MAY AFFECT INTEGRITY OF RESULTS. Performed at [facility], [place] [address] [place], [place] [address]    Calcium, Ion 1.11 (L) 1.15 - 1.40 mmol/L  HCT 30.0 (L) 36.0 - 46.0 %   Hemoglobin 10.2 (L) 12.0 - 15.0 g/dL   Patient temperature HIDE    Sample type VENOUS    Comment NOTIFIED PHYSICIAN   Troponin I (High Sensitivity)     Status: Abnormal   Collection Time: 01/06/20 12:20 AM  Result Value Ref Range   Troponin I (High Sensitivity) 18 (H) <18 ng/L    Comment: (NOTE) Elevated high sensitivity troponin I (hsTnI) values and significant  changes across serial measurements may suggest ACS but many other  chronic and acute conditions are known to elevate hsTnI results.  Refer to the "Links" section for chest pain algorithms and additional  guidance. Performed at [facility], [place] [address] [place], [place] [address]   Vancomycin, random     Status: None   Collection Time: 01/06/20 12:20 AM  Result Value Ref Range   Vancomycin Rm 34     Comment:        Random Vancomycin therapeutic range is dependent on dosage and time of specimen collection. A peak range is 20.0-40.0 ug/mL A trough range is 5.0-15.0 ug/mL        Performed at [facility] [address] [place], [place] [address]   Lactic acid, plasma     Status: Abnormal   Collection Time: 01/06/20 12:43 AM  Result Value Ref Range   Lactic Acid, Venous 0.3 (L) 0.5 - 1.9 mmol/L    Comment: Performed at [facility] [address] [place], [place] [address]  MRSA PCR  Screening     Status: Abnormal   Collection Time: 01/06/20  2:20 AM   Specimen: Nasopharyngeal  Result Value Ref Range   MRSA by PCR POSITIVE (A) NEGATIVE    Comment:        The GeneXpert MRSA Assay (FDA approved for NASAL specimens only), is one component of a comprehensive MRSA colonization surveillance program. It is not intended to diagnose MRSA infection nor to guide or monitor treatment for MRSA infections. RESULT CALLED TO, READ BACK BY AND VERIFIED WITH: STAPLETON,R RN [phone] MITCHELL,L   Blood gas, venous     Status: Abnormal   Collection Time: 01/06/20  2:30 AM  Result Value Ref Range   FIO2 50.00    pH, Ven 7.056 (LL) 7.250 - 7.430    Comment: CRITICAL RESULT CALLED TO, READ BACK BY AND VERIFIED WITH: R.STAPLETON,RN 0302 01/06/2020 M.CAMPBELL    pCO2, Ven 52.0 44.0 - 60.0 mmHg   pO2, Ven 53.9 (H) 32.0 - 45.0 mmHg   Bicarbonate 14.3 (L) 20.0 - 28.0 mmol/L   Acid-base deficit 14.5 (H) 0.0 - 2.0 mmol/L   O2 Saturation 86.7 %   Patient temperature 35.7    Collection site RIGHT BRACHIAL    Drawn by Alicia Amel    Sample type VENOUS     Comment: CALLED TO R.STAPLETON,RN 0313 01/06/2020 Performed at [facility], [place] [address], [place], [place] [address] CORRECTED ON 03/02 AT 5809: PREVIOUSLY REPORTED AS ARTERIAL   .Cooxemetry Panel (carboxy, met, total hgb, O2 sat)     Status: Abnormal   Collection Time: 01/06/20  2:30 AM  Result Value Ref Range   Total hemoglobin 9.3 (L) 12.0 - 16.0 g/dL   O2 Saturation 86.4 %   Carboxyhemoglobin 2.3 (H) 0.5 - 1.5 %   Methemoglobin 1.4 0.0 - 1.5 %    Comment: Performed at [facility] [address], [place], [place] [address]  Hemoglobin A1c     Status: Abnormal   Collection Time: 01/06/20  2:30 AM  Result Value Ref Range   Hgb A1c MFr Bld 5.7 (H) 4.8 - 5.6 %    Comment: (NOTE) Pre diabetes:          5.7%-6.4% Diabetes:              >6.4% Glycemic control for   <7.0% adults with diabetes    Mean  Plasma Glucose 116.89 mg/dL    Comment: Performed at [facility] [address] [place], [place] [address]  Glucose, capillary     Status: Abnormal   Collection Time: 01/06/20  2:33 AM  Result Value Ref Range   Glucose-Capillary 142 (H) 70 - 99 mg/dL    Comment: Glucose reference range applies only to samples taken after fasting for at least 8 hours.  Glucose, capillary     Status: Abnormal   Collection Time: 01/06/20  3:53 AM  Result Value Ref Range   Glucose-Capillary 142 (H) 70 - 99 mg/dL    Comment: Glucose reference range applies only to samples taken after fasting for at least 8 hours.  CBC     Status: Abnormal   Collection Time: 01/06/20  4:06 AM  Result Value Ref Range   WBC 17.1 (H) 4.0 - 10.5 K/uL   RBC 3.44 (L) 3.87 - 5.11 MIL/uL   Hemoglobin 9.7 (L) 12.0 - 15.0 g/dL   HCT 31.4 (L) 36.0 - 46.0 %   MCV 91.3 80.0 - 100.0 fL   MCH 28.2 26.0 - 34.0 pg   MCHC 30.9 30.0 - 36.0 g/dL   RDW 18.3 (H) 11.5 - 15.5 %   Platelets 137 (L) 150 - 400 K/uL   nRBC 0.0 0.0 - 0.2 %    Comment: Performed at [facility] [address], [place], [place] [address]  Basic metabolic panel     Status: Abnormal   Collection Time: 01/06/20  4:06 AM  Result Value Ref Range   Sodium 133 (L) 135 - 145 mmol/L   Potassium 5.3 (H) 3.5 - 5.1 mmol/L   Chloride 99 98 - 111 mmol/L   CO2 14 (L) 22 - 32 mmol/L   Glucose, Bld 143 (H) 70 - 99 mg/dL    Comment: Glucose reference range applies only to samples taken after fasting for at least 8 hours.   BUN 131 (H) 8 - 23 mg/dL   Creatinine, Ser 2.46 (H) 0.44 - 1.00 mg/dL   Calcium 9.9 8.9 - 10.3 mg/dL   GFR calc non Af Amer 19 (L) >60 mL/min   GFR calc Af Amer 22 (L) >60 mL/min   Anion gap 20 (H) 5 - 15    Comment: Performed at [facility] [address], [place], [place] [address]  Magnesium     Status: None   Collection Time: 01/06/20  4:06 AM  Result Value Ref Range   Magnesium 2.4 1.7 - 2.4 mg/dL    Comment: Performed at [facility] [address], [place], [place] [address]  Phosphorus     Status: Abnormal   Collection Time: 01/06/20  4:06 AM  Result Value Ref Range   Phosphorus 8.8 (H) 2.5 - 4.6 mg/dL    Comment: Performed at [facility] [address], [place], [place] [address]  Glucose, capillary     Status: Abnormal   Collection Time: 01/06/20  7:40 AM  Result Value Ref  Range   Glucose-Capillary 131 (H) 70 - 99 mg/dL    Comment: Glucose reference range applies only to samples taken after fasting for at least 8 hours.  Glucose, capillary     Status: None   Collection Time: 01/06/20 12:06 PM  Result Value Ref Range   Glucose-Capillary 93 70 - 99 mg/dL    Comment: Glucose reference range applies only to samples taken after fasting for at least 8 hours.   DG Chest Portable 1 View  Result Date: 01/05/2020 CLINICAL DATA:  Acidosis, generalized edema EXAM: PORTABLE CHEST 1 VIEW COMPARISON:  None. FINDINGS: Tracheostomy device is present.  Right PICC line tip overlies SVC. Low lung volumes. Small bilateral pleural effusions and bibasilar atelectasis. Central pulmonary vascular congestion. Cardiomediastinal silhouette is likely within normal limits for portable technique. IMPRESSION: Tracheostomy device and right PICC line. Small bilateral pleural effusions and bibasilar atelectasis. Central pulmonary vascular congestion Electronically Signed   By: Macy Mis M.D.   On: 01/05/2020 21:09    PMH:   Past Medical History:  Diagnosis Date  . Chronic respiratory failure ([facility] [place])   . CKD (chronic kidney disease)   . DM (diabetes mellitus) ([facility])   . GERD (gastroesophageal reflux disease)   . HTN (hypertension)   . Ogilvie syndrome   . 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 Vascular dementia ([facility])     PSH  Allergies:  Allergies  Allergen Reactions  . Drug Class [Clindamycin/Lincomycin] Hives  . Drug Ingredient [Cephalexin] Hives    Pt has tolerated Ceftriaxone, Cefepime, and Meropenem   .  Nsaids Hives    Unsure of which agents  . Penicillins Hives    Pt has tolerated Ceftriaxone, Cefepime, and Meropenem   . Sulfa Antibiotics Hives  . Dilaudid [Hydromorphone Hcl] Other (See Comments)    Shortness of breath/confusion    Medications:   Prior to Admission medications   Medication Sig Start Date End Date Taking? Authorizing Provider  acetylcysteine (MUCOMYST) 20 % nebulizer solution Take 2 mLs by nebulization every 6 (six) hours.   Yes [provider]  albuterol (PROVENTIL) (2.5 MG/3ML) 0.083% nebulizer solution Take 2.5 mg by nebulization every 6 (six) hours.   Yes [provider]  ALPRAZolam (XANAX) 0.25 MG tablet Take 0.25 mg by mouth 3 (three) times daily as needed for anxiety.   Yes [provider]  alum & mag hydroxide-simeth (MAALOX PLUS) 400-400-40 MG/5ML suspension Take 30 mLs by mouth every 6 (six) hours as needed for indigestion.   Yes [provider]  ceFEPime (MAXIPIME) IVPB Inject 1 g into the vein every 12 (twelve) hours.   Yes [provider]  esomeprazole (NEXIUM) 40 MG packet Take 40 mg by mouth daily.   Yes [provider]  insulin glargine (LANTUS) 100 UNIT/ML injection Inject 5 Units into the skin at bedtime.   Yes [provider]  insulin lispro (HUMALOG) 100 UNIT/ML injection Inject 0-14 Units into the skin every 6 (six) hours.   Yes [provider]  lidocaine (LIDODERM) 5 % Place 1 patch onto the skin daily. Remove & Discard patch within 12 hours or as directed by MD   Yes [provider]  metoprolol tartrate (LOPRESSOR) 25 MG tablet Take 12.5 mg by mouth 2 (two) times daily.   Yes [provider]  Multiple Vitamins-Minerals (CENTAMIN) LIQD Take 15 mLs by mouth daily.   Yes [provider]  nystatin (MYCOSTATIN) 100000 UNIT/ML suspension Take 5 mLs by mouth 4 (four) times  daily.   Yes [provider]  ondansetron (ZOFRAN) 4 MG/2ML SOLN injection Inject  4 mg into the vein every 8 (eight) hours as needed for nausea or vomiting.   Yes [provider]  PARoxetine (PAXIL) 30 MG tablet Take 60 mg by mouth daily.    Yes [provider]  polyethylene glycol (MIRALAX / GLYCOLAX) 17 g packet Take 17 g by mouth 2 (two) times daily as needed for moderate constipation.    Yes [provider]  senna (SENOKOT) 8.6 MG TABS tablet Take 1 tablet by mouth daily as needed for mild constipation.   Yes [provider]  traMADol (ULTRAM) 50 MG tablet Take 50 mg by mouth every 6 (six) hours as needed for moderate pain (up to 5 days).   Yes [provider]  vancomycin IVPB Inject into the vein every other day. 1500 mg, overfill 40 mL in Sodium chloride 0.9% 500 mL IVPB; started 01/06/2020   Yes [provider]    Discontinued Meds:   Medications Discontinued During This Encounter  Medication Reason  . vancomycin (VANCOCIN) IVPB 1000 mg/200 mL premix   . vancomycin (VANCOREADY) IVPB 2000 mg/400 mL   . acetaminophen (TYLENOL) 500 MG tablet Change in therapy  . atorvastatin (LIPITOR) 80 MG tablet Change in therapy  . bisacodyl (DULCOLAX) 10 MG suppository Change in therapy  . busPIRone (BUSPAR) 10 MG tablet Change in therapy  . Cholecalciferol (VITAMIN D-3) 5000 UNIT/ML LIQD Change in therapy  . clotrimazole (LOTRIMIN) 1 % cream Change in therapy  . hydrOXYzine (ATARAX/VISTARIL) 25 MG tablet Change in therapy  . levothyroxine (SYNTHROID) 50 MCG tablet Change in therapy  . Melatonin 3 MG TABS Change in therapy  . montelukast (SINGULAIR) 10 MG tablet Change in therapy  . nitroGLYCERIN (NITROSTAT) 0.4 MG SL tablet Change in therapy  . sodium chloride HYPERTONIC 3 % nebulizer solution Change in therapy    Social History:  has no history on file for tobacco, alcohol, and drug.  Family History:  No family history on file.  Blood pressure 112/81, pulse 80, temperature 99.9 F (37.7 C), resp. rate 18, height '5\' 4"'$   (1.626 m), weight 113 kg, SpO2 97 %. Pertinent items are noted in HPI.       Dwana Melena, MD 01/06/2020, 12:40 PM

## 2020-01-06 NOTE — Progress Notes (Signed)
PT placed on Vent per MD verbal order and transported to [facility] with no complications.

## 2020-01-06 NOTE — ED Notes (Signed)
CCM @ bedside attempting to place art line

## 2020-01-06 NOTE — Progress Notes (Signed)
Aline was attempted by RT and was unsuccessful in trying. MD made aware.

## 2020-01-06 NOTE — Progress Notes (Signed)
Attempted ABG times 1 but patient refused any other sticks. Very upset and doesn't want to be in this hospital anymore, wants to go to [facility]. Will continue monitoring.

## 2020-01-06 NOTE — Progress Notes (Signed)
  Echocardiogram 2D Echocardiogram has been performed with [facility].  Marybelle Killings 01/06/2020, 11:44 AM

## 2020-01-07 DIAGNOSIS — Z930 Tracheostomy status: Secondary | ICD-10-CM | POA: Diagnosis not present

## 2020-01-07 DIAGNOSIS — I959 Hypotension, unspecified: Secondary | ICD-10-CM

## 2020-01-07 DIAGNOSIS — A419 Sepsis, unspecified organism: Secondary | ICD-10-CM | POA: Diagnosis not present

## 2020-01-07 DIAGNOSIS — R6521 Severe sepsis with septic shock: Secondary | ICD-10-CM

## 2020-01-07 DIAGNOSIS — J9611 Chronic respiratory failure with hypoxia: Secondary | ICD-10-CM

## 2020-01-07 DIAGNOSIS — N179 Acute kidney failure, unspecified: Secondary | ICD-10-CM

## 2020-01-07 LAB — GLUCOSE, CAPILLARY
Glucose-Capillary: 115 mg/dL — ABNORMAL HIGH (ref 70–99)
Glucose-Capillary: 124 mg/dL — ABNORMAL HIGH (ref 70–99)
Glucose-Capillary: 118 mg/dL — ABNORMAL HIGH (ref 70–99)
Glucose-Capillary: 134 mg/dL — ABNORMAL HIGH (ref 70–99)
Glucose-Capillary: 140 mg/dL — ABNORMAL HIGH (ref 70–99)
Glucose-Capillary: 137 mg/dL — ABNORMAL HIGH (ref 70–99)

## 2020-01-07 LAB — URINE CULTURE: Culture: NO GROWTH

## 2020-01-07 LAB — BASIC METABOLIC PANEL
Anion gap: 18 — ABNORMAL HIGH (ref 5–15)
BUN: 132 mg/dL — ABNORMAL HIGH (ref 8–23)
CO2: 20 mmol/L — ABNORMAL LOW (ref 22–32)
Calcium: 9.1 mg/dL (ref 8.9–10.3)
Chloride: 96 mmol/L — ABNORMAL LOW (ref 98–111)
Creatinine, Ser: 2.61 mg/dL — ABNORMAL HIGH (ref 0.44–1.00)
GFR calc Af Amer: 21 mL/min — ABNORMAL LOW (ref >60)
GFR calc non Af Amer: 18 mL/min — ABNORMAL LOW (ref >60)
Glucose, Bld: 114 mg/dL — ABNORMAL HIGH (ref 70–99)
Potassium: 4.1 mmol/L (ref 3.5–5.1)
Sodium: 134 mmol/L — ABNORMAL LOW (ref 135–145)

## 2020-01-07 LAB — PHOSPHORUS: Phosphorus: 7.8 mg/dL — ABNORMAL HIGH (ref 2.5–4.6)

## 2020-01-07 LAB — MAGNESIUM: Magnesium: 2.2 mg/dL (ref 1.7–2.4)

## 2020-01-07 MED ORDER — PRO-STAT SUGAR FREE PO LIQD
60.0000 mL | Freq: Two times a day (BID) | ORAL | Status: DC
  Administered 2020-01-07 – 2020-01-08 (×2): 60 mL
  Filled 2020-01-07 (×2): qty 60

## 2020-01-07 MED ORDER — CALCIUM GLUCONATE-NACL 1-0.675 GM/50ML-% IV SOLN
1.0000 g | Freq: Once | INTRAVENOUS | Status: AC
  Administered 2020-01-07: 1000 mg via INTRAVENOUS
  Filled 2020-01-07: qty 50

## 2020-01-07 MED ORDER — VITAL 1.5 CAL PO LIQD
1000.0000 mL | ORAL | Status: DC
  Administered 2020-01-07 – 2020-01-08 (×2): 1000 mL
  Filled 2020-01-07 (×2): qty 1000

## 2020-01-07 MED ORDER — SODIUM BICARBONATE 650 MG PO TABS: 650.0000 mg | ORAL_TABLET | Freq: Two times a day (BID) | ORAL | Status: DC

## 2020-01-07 MED ORDER — FUROSEMIDE 10 MG/ML IJ SOLN
100.0000 mg | Freq: Once | INTRAVENOUS | Status: AC
  Administered 2020-01-07: 100 mg via INTRAVENOUS
  Filled 2020-01-07: qty 10

## 2020-01-07 MED ORDER — SODIUM BICARBONATE 650 MG PO TABS
650.0000 mg | ORAL_TABLET | Freq: Two times a day (BID) | ORAL | Status: DC
  Administered 2020-01-07 – 2020-01-19 (×25): 650 mg
  Filled 2020-01-07 (×26): qty 1

## 2020-01-07 MED ORDER — FENTANYL CITRATE (PF) 100 MCG/2ML IJ SOLN
25.0000 ug | INTRAMUSCULAR | Status: DC | PRN
  Administered 2020-01-09 – 2020-01-22 (×20): 100 ug via INTRAVENOUS
  Administered 2020-01-23: 25 ug via INTRAVENOUS
  Administered 2020-01-24 – 2020-01-26 (×2): 100 ug via INTRAVENOUS
  Administered 2020-01-28 – 2020-01-30 (×2): 50 ug via INTRAVENOUS
  Administered 2020-01-30 – 2020-02-01 (×3): 100 ug via INTRAVENOUS
  Administered 2020-02-01: 50 ug via INTRAVENOUS
  Administered 2020-02-03 – 2020-02-11 (×15): 100 ug via INTRAVENOUS
  Administered 2020-02-11: 50 ug via INTRAVENOUS
  Administered 2020-02-12: 100 ug via INTRAVENOUS
  Administered 2020-02-13: 14:00:00 50 ug via INTRAVENOUS
  Administered 2020-02-14 (×4): 100 ug via INTRAVENOUS
  Administered 2020-02-14 – 2020-02-15 (×2): 50 ug via INTRAVENOUS
  Administered 2020-02-15 – 2020-02-16 (×6): 100 ug via INTRAVENOUS
  Administered 2020-02-17: 20:00:00 50 ug via INTRAVENOUS
  Administered 2020-02-18: 100 ug via INTRAVENOUS
  Administered 2020-02-19 – 2020-02-20 (×3): 50 ug via INTRAVENOUS
  Administered 2020-02-21: 100 ug via INTRAVENOUS
  Filled 2020-01-07 (×69): qty 2

## 2020-01-07 MED ORDER — SODIUM BICARBONATE 8.4 % IV SOLN
50.0000 meq | Freq: Four times a day (QID) | INTRAVENOUS | Status: AC
  Administered 2020-01-07 – 2020-01-08 (×3): 50 meq via INTRAVENOUS
  Filled 2020-01-07 (×3): qty 50

## 2020-01-07 MED ORDER — CALCIUM GLUCONATE 10 % IV SOLN: 1.0000 g | Freq: Once | INTRAVENOUS | Status: DC

## 2020-01-07 MED ORDER — PANTOPRAZOLE SODIUM 40 MG IV SOLR
40.0000 mg | INTRAVENOUS | Status: DC
  Administered 2020-01-07 – 2020-01-08 (×2): 40 mg via INTRAVENOUS
  Filled 2020-01-07: qty 40

## 2020-01-07 MED ORDER — POTASSIUM CHLORIDE 20 MEQ/15ML (10%) PO SOLN
40.0000 meq | Freq: Once | ORAL | Status: AC
  Administered 2020-01-07: 40 meq
  Filled 2020-01-07: qty 30

## 2020-01-07 MED ORDER — VITAL 1.5 CAL PO LIQD: 1000.0000 mL | ORAL | Status: DC

## 2020-01-07 NOTE — Progress Notes (Signed)
NAME:  Bailey Cox, MRN:  [id number], DOB:  1946/06/13, LOS: 2 ADMISSION DATE:  01/05/2020, CONSULTATION DATE:  01/06/20 REFERRING MD:  Tegeler EDP, CHIEF COMPLAINT:   Shock  Brief History   74 year old female admitted 3/1 from [facility] where she had just arrived after long admission to [facility] where she suffered PEA arrest during endoscopy and ended up requiring tracheostomy. Presents to [facility] ED with widespread peripheral edema and acidosis.   History of present illness   74 year old female with PMH as below, which is significant for long admission in the [facility] system from 10/2019 all the way through 01/05/2020.  She was originally hospitalized in September 2020 with acute cholecystitis and underwent a cholecystectomy.  She was then sent to SNF for short duration but had return to [facility] to have a bile duct stent placed ultimately returning to SNF.  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  While hospitalized she developed fluid overload and was ultimately transferred to [facility] for dialysis.  Her course after that point become somewhat unclear, but as described by her family she was on and off the ventilator for several procedures and was eventually left intubated.  One of these procedures was an endoscopy during which she unfortunately suffered a cardiac arrest.  Details of the arrest are unclear.  She remained on the ventilator and ultimately underwent tracheostomy.  Her hospital course was also complicated by MRSA pneumonia.  She was ultimately able to come off of hemodialysis and was treated intermittently with diuresis.  It sounds like she bounced in and out of the ICU a few times with volume overload.  On the tail end of her admission she still struggled with kidney disease and was being treated for a urinary tract infection with cefepime and vancomycin.  Plans were to stop on 3/2.  She was discharged from the [facility] system on 3/1 to  [facility] in [place].  Upon arrival to [facility] they deemed her too sick for admission and transferred her to [facility] emergency department with complaints of volume overload and acidosis.   Past Medical History   has a past medical history of Chronic respiratory failure ([place]), CKD (chronic kidney disease), DM (diabetes mellitus) ([place]), GERD (gastroesophageal reflux disease), HTN (hypertension), Ogilvie syndrome, Tracheostomy status ([place]), and Vascular dementia ([place]).  Significant Hospital Events   07/2019 admit to [facility] for cholecystitis  09/2019 tx to [facility] for renal failure/volume overload. HD started.  10/2019 PEA arrest during EGD . Intubated 11/2019 Trach. HD stopped.   Consults:    Procedures:  PICC pre admit > Trach pre admit >  Significant Diagnostic Tests:   Echo 10/24/2019 > LVEF 55%, Grade 1 DD. RV systolic function normal.   Micro Data:  Blood 3/2 Urine 3/2  Antimicrobials:  Cefepime Pre hosp (stop date 3/2)  Vancomycin pre hosp (stop date 3/2)   Interim history/subjective:  No complaints.  Objective   Blood pressure 120/74, pulse 96, temperature 99.7 F (37.6 C), resp. rate (!) 27, height 5\' 4"  (1.626 m), weight 117.2 kg, SpO2 100 %.    Vent Mode: CPAP;PSV FiO2 (%):  [40 %-50 %] 40 % Set Rate:  [16 bmp] 16 bmp Vt Set:  [360 mL] 360 mL PEEP:  [5 cmH20] 5 cmH20 Pressure Support:  [10 [id number] cmH20] 10 cmH20 Plateau Pressure:  [16 cmH20-22 cmH20] 16 cmH20   Intake/Output Summary (Last 24 hours) at 01/07/2020 0836 Last data  filed at 01/07/2020 0800 Gross per 24 hour  Intake 4042.46 ml  Output 675 ml  Net 3367.46 ml   Filed Weights   01/05/20 1934 01/07/20 0347  Weight: 113 kg 117.2 kg    Examination: General: Obese female with trach. HENT: [place]/AT, PERRL, no JVD. Lungs: Clear bilateral breath sounds. Cardiovascular: RRR, no MRG. Marked peripheral edema.  Abdomen: Soft, non-tender, non-distended. Extremities: 4+ pitting edema.    Neuro: Alert, oriented to self. Follows commands. Answers yes/no. Reports this is near her recent baseline.  GU: Foley.    Assessment & Plan:   Sepsis - presumed 2/2 UTI. - Continue levophed titrated to MAP goal 3mmHg. - Continue cefepime, DC vanco given renal insult.  - Follow urine culture.  Acute kidney injury on CKD: was on HD acutely in late 2020. Was stopped, but it seems as though her renal function has been fluctuating since that time prompting many ICU transfers for agressive diuresis.  - Continue Diuresis, additional 100mg  today. - Nephrology following  Acidosis: mixed metabolic and respiratory: metabolic component likely uremic.  - Continue bicarb infusion, drop rate from 125 to 75 - may need vent to help compensate.   Tracheostomy status - ATC as able.  DM:  CBG monitoring and SSI.  Elevated lipase: biliary stent removed in December. - trend lipase. - hold off on imaging for now.  Best practice:  Diet: NPO Pain/Anxiety/Delirium protocol (if indicated): NA VAP protocol (if indicated): NA DVT prophylaxis: heparin SQ GI prophylaxis: NA Glucose control: SSI Mobility: BR Code Status: FULL Family Communication: Will call daughter. Disposition: ICU, critically ill.   CC time: 40 min.   Montey Hora, [facility] 01/07/2020, 9:04 AM

## 2020-01-07 NOTE — Progress Notes (Signed)
Initial Nutrition Assessment  DOCUMENTATION CODES:   Morbid obesity  INTERVENTION:   D/C Vital High Protein  Vital 1.5 @ 35 ml/hr via PEG 60 ml Prostat BID  Provides: 1660 kcal, 116 grams protein, and 641 ml free water.    NUTRITION DIAGNOSIS:   Inadequate oral intake related to inability to eat as evidenced by NPO status.  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   Per CCM pt with cardiogenic shock requiring pressor and is severely volume overloaded.  Pt weaned for 4 hours but required vent support. Pt with AKI on CKD followed by nephrology per MD not a good candidate for HD but it is not needed for now.   Pt discussed during ICU rounds and with RN.  Spoke with pt who did not respond very much. She did nod her head yes to pain in extremities. She is unable to answer any history questions at this time.   Pt has listed that she has a PEG-J, looking at the tube it does not have more than one port. No abd xray available.   Patient is currently intubated on ventilator support MV: 11.5 L/min Temp (24hrs), Avg:99.8 F (37.7 C), Min:99.5 F (37.5 C), Max:100.6 F (38.1 C)  Medications reviewed and include: SSI Levophed @ 2 mcg Nabicarb @ 75 ml/hr Labs reviewed: Na 134 (L), BUN/Cr: 132/2.61 (H), PO4: 7.8 (H)   TF: Vital High Protein @ 40 ml/hr with 30 ml Prostat BID Provides: 1160 kcal, 114 grams  protein  NUTRITION - FOCUSED PHYSICAL EXAM:    Most Recent Value  Orbital Region  No depletion  Upper Arm Region  No depletion  Thoracic and Lumbar Region  No depletion  Buccal Region  No depletion  Temple Region  No depletion  Clavicle Bone Region  No depletion  Clavicle and Acromion Bone Region  No depletion  Scapular Bone Region  Unable to assess  Dorsal Hand  No depletion  Patellar Region  No depletion  Anterior Thigh Region  No depletion  Posterior Calf Region  No depletion  Edema (RD Assessment)  Severe  Hair  Reviewed  Eyes  Reviewed  Mouth  Reviewed  Skin  Reviewed  Nails  Reviewed       Diet Order:   Diet Order            Diet NPO time specified  Diet effective now              EDUCATION NEEDS:   No education needs have been identified at this time  Skin:  Skin Assessment: (weeping arms)  Last BM:  3/2  Height:   Ht Readings from Last 1 Encounters:  01/05/20 5\' 4"  (1.626 m)    Weight:   Wt Readings from Last 1 Encounters:  01/07/20 117.2 kg    Ideal Body Weight:  54.5 kg  BMI:  Body mass index is 44.35 kg/m.  Estimated Nutritional Needs:  Kcal:  1300-1700  Protein:  110-136 grams  Fluid:  >1.5 L/day  Lockie Pares., RD, LDN, CNSC See AMiON for contact information

## 2020-01-07 NOTE — Progress Notes (Signed)
[facility] Progress Note Patient Name: Bailey Cox DOB: [birth year] MRN: [id number]   Date of Service  01/07/2020  HPI/Events of Note  Ventilated patient - Needs stress ulcer prophylaxis.  eICU Interventions  Will order: 1. Protonix IV.      Intervention Category Intermediate Interventions: Best-practice therapies (e.g. DVT, beta blocker, etc.)  [name] 01/07/2020, 1:01 AM

## 2020-01-07 NOTE — Progress Notes (Signed)
[name]   74 y.o. female who had a prolonged 53 day hospitalization at [facility] with a history of HTN COPD DM hypothyroidism Ogilvie's syndrome who was transferred to [facility] on 12/02/2019. PEA arrest during EGD in early December with duodenal ulcer noted on EGD. Intermittent hyperkalemia treatet medically w/ Kayexalate and now recently w/ worsening renal function over the past few days prior to admission to [facility]. She actually had a foley placed 12/28/19 when the renal function was worsening with return of clear purulence and then started on Meropenem. Per review of chart the pt did not want RRT but after urging by family agreed to it. She was also started on Cefepime and Vanco for tracheitis in Feb.   Assessment/ Plan:   1. AKI on CKD with most likely cause of the AKI being ATN from sepsis.  - She is not a good candidate for dialysis and fortunately at this time we do not need to make that decision.  - Strict I&O's and currently she is making urine. - Will send urine studies as well as cultures -> a lot of  WBC's and cx is pending with NGT. - Continue HCO3 gtt + Lasix gtt if needed to manage the acidosis and vol status. - Lasix gtt to be started today - [name] [phone]  and [name] [phone]; both have POA. 2. Sepsis - on pressors and broad spectrum abx. Reculturing. 3. ?urosepsis - will recheck urinalysis microscopy and culture, cefepime vanc on 2/25 to 3/2 -> stopped the vanco w/ worsening renal function. 4. H/o PEA arrest 5. MRSA PNA on vanc/cefepime -> cefepime 6. Respiratory failure on trach 7. HFpEF  Subjective:   On Levophed 1mg Had 775 ml/24hr UOP   Objective:   BP 139/63   Pulse 90   Temp 99.5 F (37.5 C)   Resp (!) 26   Ht '5\' 4"'$  (1.626 m)   Wt 117.2 kg   SpO2 100%   BMI 44.35 kg/m   Intake/Output Summary (Last 24 hours) at 01/07/2020 1037 Last data filed at 01/07/2020 1000 Gross  per 24 hour  Intake 4025.18 ml  Output 595 ml  Net 3430.18 ml   Weight change: 4.2 kg  Physical Exam: GEN: NAD, trached HEENT: No conjunctival pallor, EOMI NECK: Supple, no thyromegaly LUNGS: CTA B/L  CV: RRR, No M/R/G ABD: SNDNT +BS  EXT: 1+ lower extremity edema    Imaging: DG Chest Portable 1 View  Result Date: 01/05/2020 CLINICAL DATA:  Acidosis, generalized edema EXAM: PORTABLE CHEST 1 VIEW COMPARISON:  None. FINDINGS: Tracheostomy device is present.  Right PICC line tip overlies SVC. Low lung volumes. Small bilateral pleural effusions and bibasilar atelectasis. Central pulmonary vascular congestion. Cardiomediastinal silhouette is likely within normal limits for portable technique. IMPRESSION: Tracheostomy device and right PICC line. Small bilateral pleural effusions and bibasilar atelectasis. Central pulmonary vascular congestion Electronically Signed   By: PMacy MisM.D.   On: 01/05/2020 21:09   ECHOCARDIOGRAM COMPLETE  Result Date: 01/06/2020    ECHOCARDIOGRAM REPORT   Patient Name:   BTOBEY LIPPARDDate of Exam: 01/06/2020 Medical Rec #:  [id number]       Height:       64.0 in Accession #:    [id number]      Weight:       249.1 lb Date of Birth:  106-05-1946       BSA:  2.148 m Patient Age:    41 years         BP:           103/38 mmHg Patient Gender: F                HR:           80 bpm. Exam Location:  Inpatient Procedure: 2D Echo, Cardiac Doppler, Color Doppler and Intracardiac            Opacification Agent Indications:    Dyspnea 786.09 / R06.00                 Abnormal ECG 794.31 / R94.31  History:        Patient has no prior history of Echocardiogram examinations.                 Shock. Renal failure. Chronic hypoxemic respiratory failure.  Sonographer:    Clayton Lefort RDCS (AE) Referring Phys: [id number] Silvestre Moment [facility]  Sonographer Comments: Technically difficult study due to poor echo windows, suboptimal parasternal window, suboptimal subcostal window and patient is  morbidly obese. Patient has tracheostomy tube; tube and bandange in subcoastal area. IMPRESSIONS  1. Left ventricular ejection fraction, by estimation, is 65 to 70%. The left ventricle has normal function. The left ventricle has no regional wall motion abnormalities. There is moderate concentric left ventricular hypertrophy. Left ventricular diastolic parameters are consistent with Grade II diastolic dysfunction (pseudonormalization). Elevated left atrial pressure.  2. Right ventricular systolic function is normal. The right ventricular size is severely enlarged. There is normal pulmonary artery systolic pressure. The estimated right ventricular systolic pressure is 18.2 mmHg.  3. Left atrial size was mildly dilated.  4. The mitral valve is normal in structure and function. No evidence of mitral valve regurgitation. No evidence of mitral stenosis.  5. The aortic valve is normal in structure and function. Aortic valve regurgitation is not visualized. No aortic stenosis is present.  6. The inferior vena cava is dilated in size with <50% respiratory variability, suggesting right atrial pressure of 15 mmHg. FINDINGS  Left Ventricle: Left ventricular ejection fraction, by estimation, is 65 to 70%. The left ventricle has normal function. The left ventricle has no regional wall motion abnormalities. Definity contrast agent was given IV to delineate the left ventricular  endocardial borders. The left ventricular internal cavity size was normal in size. There is moderate concentric left ventricular hypertrophy. Left ventricular diastolic parameters are consistent with Grade II diastolic dysfunction (pseudonormalization).  Elevated left atrial pressure. Right Ventricle: The right ventricular size is severely enlarged. Right vetricular wall thickness was not assessed. Right ventricular systolic function is normal. There is normal pulmonary artery systolic pressure. The tricuspid regurgitant velocity is 1.69 m/s, and with an  assumed right atrial pressure of 15 mmHg, the estimated right ventricular systolic pressure is 99.3 mmHg. Left Atrium: Left atrial size was mildly dilated. Right Atrium: Right atrial size was normal in size. Pericardium: There is no evidence of pericardial effusion. Mitral Valve: The mitral valve is normal in structure and function. Normal mobility of the mitral valve leaflets. No evidence of mitral valve regurgitation. No evidence of mitral valve stenosis. MV peak gradient, 14.0 mmHg. The mean mitral valve gradient  is 4.0 mmHg. Tricuspid Valve: The tricuspid valve is normal in structure. Tricuspid valve regurgitation is trivial. No evidence of tricuspid stenosis. Aortic Valve: The aortic valve is normal in structure and function. Aortic valve regurgitation is not visualized. No aortic stenosis  is present. Aortic valve mean gradient measures 9.7 mmHg. Aortic valve peak gradient measures 16.5 mmHg. Aortic valve area, by VTI measures 2.12 cm. Pulmonic Valve: The pulmonic valve was normal in structure. Pulmonic valve regurgitation is not visualized. No evidence of pulmonic stenosis. Aorta: The aortic root is normal in size and structure. Venous: IVC assessment for right atrial pressure unable to be performed due to mechanical ventilation. The inferior vena cava is dilated in size with less than 50% respiratory variability, suggesting right atrial pressure of 15 mmHg. IAS/Shunts: No atrial level shunt detected by color flow Doppler.  LEFT VENTRICLE PLAX 2D LVIDd:         3.80 cm  Diastology LVIDs:         2.60 cm  LV e' lateral:   5.03 cm/s LV PW:         1.40 cm  LV E/e' lateral: 27.4 LV IVS:        1.40 cm  LV e' medial:    4.14 cm/s LVOT diam:     1.80 cm  LV E/e' medial:  33.3 LV SV:         91 LV SV Index:   42 LVOT Area:     2.54 cm  RIGHT VENTRICLE             IVC RV Basal diam:  5.20 cm     IVC diam: 2.10 cm RV S prime:     11.90 cm/s TAPSE (M-mode): 2.5 cm LEFT ATRIUM             Index       RIGHT ATRIUM            Index LA diam:        3.80 cm 1.77 cm/m  RA Area:     17.20 cm LA Vol (A2C):   88.6 ml 41.25 ml/m RA Volume:   51.10 ml  23.79 ml/m LA Vol (A4C):   77.2 ml 35.94 ml/m LA Biplane Vol: 85.9 ml 40.00 ml/m  AORTIC VALVE AV Area (Vmax):    2.22 cm AV Area (Vmean):   2.17 cm AV Area (VTI):     2.12 cm AV Vmax:           203.00 cm/s AV Vmean:          145.667 cm/s AV VTI:            0.428 m AV Peak Grad:      16.5 mmHg AV Mean Grad:      9.7 mmHg LVOT Vmax:         177.00 cm/s LVOT Vmean:        124.000 cm/s LVOT VTI:          0.357 m LVOT/AV VTI ratio: 0.83  AORTA Ao Root diam: 2.90 cm Ao Asc diam:  3.20 cm MITRAL VALVE                TRICUSPID VALVE MV Area (PHT): 2.91 cm     TR Peak grad:   11.4 mmHg MV Peak grad:  14.0 mmHg    TR Vmax:        169.00 cm/s MV Mean grad:  4.0 mmHg MV Vmax:       1.87 m/s     SHUNTS MV Vmean:      83.5 cm/s    Systemic VTI:  0.36 m MV Decel Time: 261 msec     Systemic Diam: 1.80 cm MV E velocity: 138.00 cm/s  MV A velocity: 97.40 cm/s MV E/A ratio:  1.42 Mihai Croitoru MD Electronically signed by Sanda Klein MD Signature Date/Time: 01/06/2020/1:49:05 PM    Final     Labs: BMET Recent Labs  Lab 01/05/20 2004 01/05/20 2030 01/06/20 0406 01/06/20 1305 01/06/20 1830 01/07/20 0342  NA 132* 128* 133* 132* 132* 134*  K 4.9 6.3* 5.3* 4.6 4.8 4.1  CL 101  --  99  --  96* 96*  CO2 11*  --  14*  --  17* 20*  GLUCOSE 113*  --  143*  --  93 114*  BUN 128*  --  131*  --  133* 132*  CREATININE 2.47*  --  2.46*  --  2.60* 2.61*  CALCIUM 9.7  --  9.9  --  9.3 9.1  PHOS  --   --  8.8*  --  8.2* 7.8*   CBC Recent Labs  Lab 01/05/20 2004 01/05/20 2030 01/06/20 0406 01/06/20 1305  WBC 9.3  --  17.1*  --   NEUTROABS 7.9*  --   --   --   HGB 9.4* 10.2* 9.7* 9.2*  HCT 30.9* 30.0* 31.4* 27.0*  MCV 93.1  --  91.3  --   PLT 89*  --  137*  --     Medications:    . chlorhexidine gluconate (MEDLINE KIT)  15 mL Mouth Rinse BID  . Chlorhexidine Gluconate Cloth  6  each Topical Daily  . Chlorhexidine Gluconate Cloth  6 each Topical Q0600  . feeding supplement (PRO-STAT SUGAR FREE 64)  30 mL Per Tube BID  . feeding supplement (VITAL HIGH PROTEIN)  1,000 mL Per Tube Q24H  . heparin  5,000 Units Subcutaneous Q8H  . insulin aspart  0-15 Units Subcutaneous Q4H  . mouth rinse  15 mL Mouth Rinse 10 times per day  . mupirocin ointment  1 application Nasal BID  . pantoprazole (PROTONIX) IV  40 mg Intravenous Q24H  . sodium chloride flush  10-40 mL Intracatheter Q12H      Otelia Santee, MD 01/07/2020, 10:37 AM

## 2020-01-07 NOTE — Progress Notes (Signed)
Pt weaned for about 4 hours, pt became tachypnic and nodded head yes when asked if having trouble breathing. Respiratory at bedside. Will continue to monitor

## 2020-01-08 DIAGNOSIS — J9600 Acute respiratory failure, unspecified whether with hypoxia or hypercapnia: Secondary | ICD-10-CM | POA: Diagnosis not present

## 2020-01-08 DIAGNOSIS — N179 Acute kidney failure, unspecified: Secondary | ICD-10-CM | POA: Diagnosis not present

## 2020-01-08 DIAGNOSIS — Z930 Tracheostomy status: Secondary | ICD-10-CM | POA: Diagnosis not present

## 2020-01-08 LAB — LIPASE, BLOOD: Lipase: 422 U/L — ABNORMAL HIGH (ref 11–51)

## 2020-01-08 LAB — CBC
HCT: 21.3 % — ABNORMAL LOW (ref 36.0–46.0)
HCT: 25.7 % — ABNORMAL LOW (ref 36.0–46.0)
Hemoglobin: 6.7 g/dL — CL (ref 12.0–15.0)
Hemoglobin: 8.2 g/dL — ABNORMAL LOW (ref 12.0–15.0)
MCH: 27.9 pg (ref 26.0–34.0)
MCH: 28.6 pg (ref 26.0–34.0)
MCHC: 31.5 g/dL (ref 30.0–36.0)
MCHC: 31.9 g/dL (ref 30.0–36.0)
MCV: 88.8 fL (ref 80.0–100.0)
MCV: 89.5 fL (ref 80.0–100.0)
Platelets: 85 10*3/uL — ABNORMAL LOW (ref 150–400)
Platelets: 103 10*3/uL — ABNORMAL LOW (ref 150–400)
RBC: 2.4 MIL/uL — ABNORMAL LOW (ref 3.87–5.11)
RBC: 2.87 MIL/uL — ABNORMAL LOW (ref 3.87–5.11)
RDW: 18.4 % — ABNORMAL HIGH (ref 11.5–15.5)
RDW: 18.7 % — ABNORMAL HIGH (ref 11.5–15.5)
WBC: 7.7 10*3/uL (ref 4.0–10.5)
WBC: 10.7 10*3/uL — ABNORMAL HIGH (ref 4.0–10.5)
nRBC: 0 % (ref 0.0–0.2)
nRBC: 0 % (ref 0.0–0.2)

## 2020-01-08 LAB — BASIC METABOLIC PANEL
Anion gap: 16 — ABNORMAL HIGH (ref 5–15)
BUN: 140 mg/dL — ABNORMAL HIGH (ref 8–23)
CO2: 22 mmol/L (ref 22–32)
Calcium: 8.9 mg/dL (ref 8.9–10.3)
Chloride: 98 mmol/L (ref 98–111)
Creatinine, Ser: 2.85 mg/dL — ABNORMAL HIGH (ref 0.44–1.00)
GFR calc Af Amer: 19 mL/min — ABNORMAL LOW (ref >60)
GFR calc non Af Amer: 16 mL/min — ABNORMAL LOW (ref >60)
Glucose, Bld: 179 mg/dL — ABNORMAL HIGH (ref 70–99)
Potassium: 4.4 mmol/L (ref 3.5–5.1)
Sodium: 136 mmol/L (ref 135–145)

## 2020-01-08 LAB — GLUCOSE, CAPILLARY
Glucose-Capillary: 176 mg/dL — ABNORMAL HIGH (ref 70–99)
Glucose-Capillary: 120 mg/dL — ABNORMAL HIGH (ref 70–99)
Glucose-Capillary: 125 mg/dL — ABNORMAL HIGH (ref 70–99)
Glucose-Capillary: 144 mg/dL — ABNORMAL HIGH (ref 70–99)
Glucose-Capillary: 105 mg/dL — ABNORMAL HIGH (ref 70–99)
Glucose-Capillary: 103 mg/dL — ABNORMAL HIGH (ref 70–99)

## 2020-01-08 LAB — PHOSPHORUS: Phosphorus: 6.7 mg/dL — ABNORMAL HIGH (ref 2.5–4.6)

## 2020-01-08 LAB — MAGNESIUM: Magnesium: 2.2 mg/dL (ref 1.7–2.4)

## 2020-01-08 MED ORDER — METOPROLOL TARTRATE 5 MG/5ML IV SOLN
2.5000 mg | INTRAVENOUS | Status: DC | PRN
  Administered 2020-01-13 – 2020-03-18 (×4): 5 mg via INTRAVENOUS
  Filled 2020-01-08 (×7): qty 5

## 2020-01-08 MED ORDER — GENERIC EXTERNAL MEDICATION
Status: DC
Start: ? — End: 2020-01-08

## 2020-01-08 MED ORDER — PANTOPRAZOLE SODIUM 40 MG PO PACK
40.0000 mg | PACK | Freq: Every day | ORAL | Status: DC
  Administered 2020-01-09 – 2020-01-10 (×2): 40 mg
  Filled 2020-01-08 (×2): qty 20

## 2020-01-08 NOTE — Progress Notes (Signed)
CRITICAL VALUE ALERT  Critical Value:  Hemoglobin 6.7  Date & Time Notied:  01/08/2020 at 0600. Donato Heinz, RN notified by lab. Critical value passed to be during shift report and I notified Dr. Elsworth Soho   Provider Notified: Dr. Elsworth Soho   Orders Received/Actions taken: Orders to repeat CBC based on prior results.

## 2020-01-08 NOTE — Progress Notes (Signed)
Lipase has increased from 90 on admit to 422 now We will hold tube feeds and recheck in a.m. Abdomen remains benign on exam.  She also developed atrial fibrillation/RVR -We will use Lopressor as needed for rate control  Treavon Castilleja V. Elsworth Soho MD

## 2020-01-08 NOTE — Progress Notes (Signed)
HR converted to afib with a rate in the 120's-150's. BP stable. Dr. Elsworth Soho at bedside. New orders for PRN lopressor.

## 2020-01-08 NOTE — Progress Notes (Signed)
Pharmacy Antibiotic Note Bailey Cox is a 74 y.o. female admitted on 01/05/2020 with sepsis. Pharmacy has been consulted for Cefepime dosing - was initiated pre-hospital stay for tracheitis.  WBC is trending down overall. Cultures without growth today. Increased secretions and ventilator requirements - sending new respiratory culture today. Patient remains afebrile.  SCr trending up but good UOP at 0.5 mL/kg/hr with estimated CrCl 22 mL/min - ongoing diureses with Lasix. May need HD 3/5 per Nephrology progress notes. Reassessing daily.   Plan: Continue Cefepime 2gm IV Q24H F/u renal fxn, C&S, clinical status and peak/trough at [facility] Follow-up potential plans for HD  Height: 5\' 4"  (162.6 cm) Weight: 255 lb 15.3 oz (116.1 kg) IBW/kg (Calculated) : 54.7  Temp (24hrs), Avg:99.5 F (37.5 C), Min:97.9 F (36.6 C), Max:100 F (37.8 C)  Recent Labs  Lab 01/05/20 2004 01/06/20 0020 01/06/20 0043 01/06/20 0406 01/06/20 1830 01/07/20 0342 01/08/20 0559 01/08/20 0820  WBC 9.3  --   --  17.1*  --   --  7.7 10.7*  CREATININE 2.47*  --   --  2.46* 2.60* 2.61* 2.85*  --   LATICACIDVEN 0.3*  --  0.3*  --   --   --   --   --   VANCORANDOM  --  34  --   --   --   --   --   --     Estimated Creatinine Clearance: 22.7 mL/min (A) (by C-G formula based on SCr of 2.85 mg/dL (H)).    Allergies  Allergen Reactions  . Drug Class [Clindamycin/Lincomycin] Hives  . Drug Ingredient [Cephalexin] Hives    Pt has tolerated Ceftriaxone, Cefepime, and Meropenem   . Nsaids Hives    Unsure of which agents  . Penicillins Hives    Pt has tolerated Ceftriaxone, Cefepime, and Meropenem   . Sulfa Antibiotics Hives  . Dilaudid [Hydromorphone Hcl] Other (See Comments)    Shortness of breath/confusion    Antimicrobials this admission: Vanc PTA>> Cefepime PTA>> Flagyl x 1 3/1  Dose adjustments this admission: N/A  Microbiology results: 2/21 Resp - EColi, pseudomonas, MRSA 2/21 Urine - EColi 3/1 Urine  - negative 3/1 BCx - ngtd x3d 3/2 MRSA PCR positive 3/4 RCx >>   Thank you for allowing pharmacy to be a part of this patient's care.  Sloan Leiter, PharmD, BCPS, BCCCP Clinical Pharmacist Clinical phone 01/08/2020 until [phone] Please refer to [facility] for [employer] numbers 01/08/2020 10:30 AM

## 2020-01-08 NOTE — Progress Notes (Signed)
[facility] Progress Note   74 y.o.femalewho had a prolonged 81 day hospitalization at [facility] with a history of HTN COPD DM hypothyroidism Ogilvie's syndrome who was transferred to [place] on 12/02/2019. PEA arrest during EGD in early December with duodenal ulcer noted on EGD. Intermittent hyperkalemia treatet medically w/ Kayexalate and now recently w/ worsening renal function over the past few days prior to admission to [facility]. She actually had a foley placed 12/28/19 when the renal function was worsening with return of clear purulence and then started on Meropenem. Per review of chart the pt did not want RRT but after urging by family agreed to it. She was also started on Cefepime and Vanco for tracheitis in Feb.   Assessment/ Plan:   1. AKI on CKD with most likely cause of the AKI being ATN from sepsis.  - She is not a good candidate for dialysis and fortunately at this time we do not need to make that decision but she is still headed the wrong direction.  - Off HCO3 gtt and now on NaHCO3 tabs + Lasix  to manage the acidosis and vol status. - Responding nicely to Lasix w/ good UOP but again azotemic.  - [name] [phone] and [name] [phone]; both have POA.  I spoke with Katharine Look today and they've never refused RRT; actually was on dialysis for a mth at [facility].  I will give her 1 more day given the good UOP but unless there is a change we will challenge with iHD tomorrow.  2. Sepsis - on pressors and broad spectrum abx (cefepime). Reculturing. 3. ?urosepsis - will recheck urinalysis microscopy and culture, cefepime vanc on 2/25 to 3/2 -> stopped the vanco w/ worsening renal function. 4. H/o PEA arrest 5. MRSA PNA on vanc/cefepime -> cefepime 6. Respiratory failure on trach 7. HFpEF  Subjective:   Off Levophed  Had 775 ml/1525  48hr UOP   Objective:   BP 108/75   Pulse 95   Temp 99 F (37.2 C)   Resp (!)  27   Ht '5\' 4"'$  (1.626 m)   Wt 116.1 kg   SpO2 (!) 85%   BMI 43.93 kg/m   Intake/Output Summary (Last 24 hours) at 01/08/2020 0813 Last data filed at 01/08/2020 0600 Gross per 24 hour  Intake 1467.77 ml  Output 1525 ml  Net -57.23 ml   Weight change: -1.1 kg  Physical Exam: GEN: NAD, trached HEENT: No conjunctival pallor, EOMI NECK: Supple, no thyromegaly LUNGS: CTA B/L  CV: RRR, No M/R/G ABD: SNDNT +BS  EXT: 1+ lower extremity edema  Imaging: ECHOCARDIOGRAM COMPLETE  Result Date: 01/06/2020    ECHOCARDIOGRAM REPORT   Patient Name:   CHRISSI CROW Date of Exam: 01/06/2020 Medical Rec #:  [id number]        Height:       64.0 in Accession #:    [id number]       Weight:       249.1 lb Date of Birth:  09/28/1946        BSA:          2.148 m Patient Age:    30 years         BP:           103/38 mmHg Patient Gender: F                HR:           80  bpm. Exam Location:  Inpatient Procedure: 2D Echo, Cardiac Doppler, Color Doppler and Intracardiac            Opacification Agent Indications:    Dyspnea 786.09 / R06.00                 Abnormal ECG 794.31 / R94.31  History:        Patient has no prior history of Echocardiogram examinations.                 Shock. Renal failure. Chronic hypoxemic respiratory failure.  Sonographer:    Clayton Lefort RDCS (AE) Referring Phys: [id number] Sil[address] Moment [facility]  Sonographer Comments: Technically difficult study due to poor echo windows, suboptimal parasternal window, suboptimal subcostal window and patient is morbidly obese. Patient has tracheostomy tube; tube and bandange in subcoastal area. IMPRESSIONS  1. Left ventricular ejection fraction, by estimation, is 65 to 70%. The left ventricle has normal function. The left ventricle has no regional wall motion abnormalities. There is moderate concentric left ventricular hypertrophy. Left ventricular diastolic parameters are consistent with Grade II diastolic dysfunction (pseudonormalization). Elevated left atrial pressure.  2.  Right ventricular systolic function is normal. The right ventricular size is severely enlarged. There is normal pulmonary artery systolic pressure. The estimated right ventricular systolic pressure is 56.3 mmHg.  3. Left atrial size was mildly dilated.  4. The mitral valve is normal in structure and function. No evidence of mitral valve regurgitation. No evidence of mitral stenosis.  5. The aortic valve is normal in structure and function. Aortic valve regurgitation is not visualized. No aortic stenosis is present.  6. The inferior vena cava is dilated in size with <50% respiratory variability, suggesting right atrial pressure of 15 mmHg. FINDINGS  Left Ventricle: Left ventricular ejection fraction, by estimation, is 65 to 70%. The left ventricle has normal function. The left ventricle has no regional wall motion abnormalities. Definity contrast agent was given IV to delineate the left ventricular  endocardial borders. The left ventricular internal cavity size was normal in size. There is moderate concentric left ventricular hypertrophy. Left ventricular diastolic parameters are consistent with Grade II diastolic dysfunction (pseudonormalization).  Elevated left atrial pressure. Right Ventricle: The right ventricular size is severely enlarged. Right vetricular wall thickness was not assessed. Right ventricular systolic function is normal. There is normal pulmonary artery systolic pressure. The tricuspid regurgitant velocity is 1.69 m/s, and with an assumed right atrial pressure of 15 mmHg, the estimated right ventricular systolic pressure is 89.3 mmHg. Left Atrium: Left atrial size was mildly dilated. Right Atrium: Right atrial size was normal in size. Pericardium: There is no evidence of pericardial effusion. Mitral Valve: The mitral valve is normal in structure and function. Normal mobility of the mitral valve leaflets. No evidence of mitral valve regurgitation. No evidence of mitral valve stenosis. MV peak  gradient, 14.0 mmHg. The mean mitral valve gradient  is 4.0 mmHg. Tricuspid Valve: The tricuspid valve is normal in structure. Tricuspid valve regurgitation is trivial. No evidence of tricuspid stenosis. Aortic Valve: The aortic valve is normal in structure and function. Aortic valve regurgitation is not visualized. No aortic stenosis is present. Aortic valve mean gradient measures 9.7 mmHg. Aortic valve peak gradient measures 16.5 mmHg. Aortic valve area, by VTI measures 2.12 cm. Pulmonic Valve: The pulmonic valve was normal in structure. Pulmonic valve regurgitation is not visualized. No evidence of pulmonic stenosis. Aorta: The aortic root is normal in size and structure. Venous: IVC assessment for right atrial  pressure unable to be performed due to mechanical ventilation. The inferior vena cava is dilated in size with less than 50% respiratory variability, suggesting right atrial pressure of 15 mmHg. IAS/Shunts: No atrial level shunt detected by color flow Doppler.  LEFT VENTRICLE PLAX 2D LVIDd:         3.80 cm  Diastology LVIDs:         2.60 cm  LV e' lateral:   5.03 cm/s LV PW:         1.40 cm  LV E/e' lateral: 27.4 LV IVS:        1.40 cm  LV e' medial:    4.14 cm/s LVOT diam:     1.80 cm  LV E/e' medial:  33.3 LV SV:         91 LV SV Index:   42 LVOT Area:     2.54 cm  RIGHT VENTRICLE             IVC RV Basal diam:  5.20 cm     IVC diam: 2.10 cm RV S prime:     11.90 cm/s TAPSE (M-mode): 2.5 cm LEFT ATRIUM             Index       RIGHT ATRIUM           Index LA diam:        3.80 cm 1.77 cm/m  RA Area:     17.20 cm LA Vol (A2C):   88.6 ml 41.25 ml/m RA Volume:   51.10 ml  23.79 ml/m LA Vol (A4C):   77.2 ml 35.94 ml/m LA Biplane Vol: 85.9 ml 40.00 ml/m  AORTIC VALVE AV Area (Vmax):    2.22 cm AV Area (Vmean):   2.17 cm AV Area (VTI):     2.12 cm AV Vmax:           203.00 cm/s AV Vmean:          145.667 cm/s AV VTI:            0.428 m AV Peak Grad:      16.5 mmHg AV Mean Grad:      9.7 mmHg LVOT Vmax:          177.00 cm/s LVOT Vmean:        124.000 cm/s LVOT VTI:          0.357 m LVOT/AV VTI ratio: 0.83  AORTA Ao Root diam: 2.90 cm Ao Asc diam:  3.20 cm MITRAL VALVE                TRICUSPID VALVE MV Area (PHT): 2.91 cm     TR Peak grad:   11.4 mmHg MV Peak grad:  14.0 mmHg    TR Vmax:        169.00 cm/s MV Mean grad:  4.0 mmHg MV Vmax:       1.87 m/s     SHUNTS MV Vmean:      83.5 cm/s    Systemic VTI:  0.36 m MV Decel Time: 261 msec     Systemic Diam: 1.80 cm MV E velocity: 138.00 cm/s MV A velocity: 97.40 cm/s MV E/A ratio:  1.42 Mihai Croitoru MD Electronically signed by Sanda Klein MD Signature Date/Time: 01/06/2020/1:49:05 PM    Final     Labs: BMET Recent Labs  Lab 01/05/20 2004 01/05/20 2030 01/06/20 0406 01/06/20 1305 01/06/20 1830 01/07/20 0342 01/08/20 0559  NA 132* 128* 133* 132* 132* 134* 136  K 4.9 6.3* 5.3* 4.6 4.8 4.1 4.4  CL 101  --  99  --  96* 96* 98  CO2 11*  --  14*  --  17* 20* 22  GLUCOSE 113*  --  143*  --  93 114* 179*  BUN 128*  --  131*  --  133* 132* 140*  CREATININE 2.47*  --  2.46*  --  2.60* 2.61* 2.85*  CALCIUM 9.7  --  9.9  --  9.3 9.1 8.9  PHOS  --   --  8.8*  --  8.2* 7.8* 6.7*   CBC Recent Labs  Lab 01/05/20 2004 01/05/20 2004 01/05/20 2030 01/06/20 0406 01/06/20 1305 01/08/20 0559  WBC 9.3  --   --  17.1*  --  7.7  NEUTROABS 7.9*  --   --   --   --   --   HGB 9.4*   < > 10.2* 9.7* 9.2* 6.7*  HCT 30.9*   < > 30.0* 31.4* 27.0* 21.3*  MCV 93.1  --   --  91.3  --  88.8  PLT 89*  --   --  137*  --  85*   < > = values in this interval not displayed.    Medications:    . chlorhexidine gluconate (MEDLINE KIT)  15 mL Mouth Rinse BID  . Chlorhexidine Gluconate Cloth  6 each Topical Daily  . Chlorhexidine Gluconate Cloth  6 each Topical Q0600  . feeding supplement (PRO-STAT SUGAR FREE 64)  60 mL Per Tube BID  . feeding supplement (VITAL 1.5 CAL)  1,000 mL Per Tube Q24H  . heparin  5,000 Units Subcutaneous Q8H  . insulin aspart  0-15 Units  Subcutaneous Q4H  . mouth rinse  15 mL Mouth Rinse 10 times per day  . mupirocin ointment  1 application Nasal BID  . pantoprazole (PROTONIX) IV  40 mg Intravenous Q24H  . sodium bicarbonate  650 mg Per Tube BID  . sodium chloride flush  10-40 mL Intracatheter Q12H      Otelia Santee, MD 01/08/2020, 8:13 AM

## 2020-01-08 NOTE — Progress Notes (Signed)
NAME:  Bailey Cox, MRN:  [id number], DOB:  05-13-1946, LOS: 3 ADMISSION DATE:  01/05/2020, CONSULTATION DATE:  01/06/20 REFERRING MD:  Tegeler EDP, CHIEF COMPLAINT:   Shock  Brief History   74 year old female admitted 3/1 from [place] where she had just arrived after long admission to [facility] where she suffered PEA arrest during endoscopy and ended up requiring tracheostomy. Presents to [facility] ED with widespread peripheral edema and acidosis.   History of present illness   74 year old female with PMH as below, which is significant for long admission in the [facility] system from 10/2019 all the way through 01/05/2020.  She was originally hospitalized in September 2020 with acute cholecystitis and underwent a cholecystectomy.  She was then sent to SNF for short duration but had return to [facility] to have a bile duct stent placed ultimately returning to SNF.  She then returned to [facility] with bowel dilation secondary to Ogilvie's syndrome.  While hospitalized she developed fluid overload and was ultimately transferred to [facility] for dialysis.  Her course after that point become somewhat unclear, but as described by her family she was on and off the ventilator for several procedures and was eventually left intubated.  One of these procedures was an endoscopy during which she unfortunately suffered a cardiac arrest.  Details of the arrest are unclear.  She remained on the ventilator and ultimately underwent tracheostomy.  Her hospital course was also complicated by MRSA pneumonia.  She was ultimately able to come off of hemodialysis and was treated intermittently with diuresis.  It sounds like she bounced in and out of the ICU a few times with volume overload.  On the tail end of her admission she still struggled with kidney disease and was being treated for a urinary tract infection with cefepime and vancomycin.  Plans were to stop on 3/2.  She was discharged from the [facility] system on 3/1 to  [facility] in [place].  Upon arrival to [facility] they deemed her too sick for admission and transferred her to [facility] emergency department with complaints of volume overload and acidosis.   Past Medical History   has a past medical history of Chronic respiratory failure ([place]), CKD (chronic kidney disease), DM (diabetes mellitus) ([place]), GERD (gastroesophageal reflux disease), HTN (hypertension), Ogilvie syndrome, Tracheostomy status ([place]), and Vascular dementia ([place]).  Significant Hospital Events   07/2019 admit to [facility] for cholecystitis  09/2019 tx to [facility] for renal failure/volume overload. HD started.  10/2019 PEA arrest during EGD . Intubated 11/2019 Trach. HD stopped.   Consults:    Procedures:  PICC pre admit > Trach pre admit >  Significant Diagnostic Tests:   Echo 10/24/2019 > LVEF 55%, Grade 1 DD. RV systolic function normal.   Micro Data:  Blood 3/1 >> ng Urine 3/1 >> ng resp 3/4 >>  Antimicrobials:  Meropenem 2/21 >> off  Cefepime Pre hosp (for tracheitis )  >> Vancomycin pre hosp >> 3/2   Interim history/subjective:   Afebrile Critically ill, on vent    Objective   Blood pressure 108/75, pulse 94, temperature 98.4 F (36.9 C), resp. rate 15, height 5\' 4"  (1.626 m), weight 116.1 kg, SpO2 99 %.    Vent Mode: SIMV;PSV;PRVC FiO2 (%):  [35 %-60 %] 60 % Set Rate:  [6 bmp-16 bmp] 6 bmp Vt Set:  [360 mL] 360 mL PEEP:  [5 cmH20] 5 cmH20 Pressure Support:  [20 cmH20] 20 cmH20 Plateau Pressure:  [  16 cmH20-22 cmH20] 16 cmH20   Intake/Output Summary (Last 24 hours) at 01/08/2020 0904 Last data filed at 01/08/2020 0600 Gross per 24 hour  Intake 1295.92 ml  Output 1525 ml  Net -229.08 ml   Filed Weights   01/05/20 1934 01/07/20 0347 01/08/20 0500  Weight: 113 kg 117.2 kg 116.1 kg    Examination: General: Obese elderly woman  with trach. HENT: [place]/AT, PERRL, no JVD. Lungs: Decreased breath sounds bilateral, copious secretions Cardiovascular:  RRR, no MRG. Marked anasarca Abdomen: Soft, non-tender, non-distended. Extremities: 4+ pitting edema.  Neuro: Awake, eyes open, intermittently follows commands , did not do so for me today GU: Foley.    Labs showed normal electrolytes, rising creatinine and BUN, hemoglobin noted to be low 6.7, 8.2 on repeat, improving thrombocytopenia  Assessment & Plan:   Septic shock-UTI was treated with meropenem 2/21 Cefepime and Vanco started PTA for tracheitis with stop date 3/2 No evidence of HAP on CXR but copious secretions ?  Tracheobronchitis -Off pressors - Continue cefepime -appears to have responded -Obtain respiratory culture, blood cultures negative but as she is at high risk for MDR organisms  Acute kidney injury on CKD: was on HD acutely in late 2020. Was stopped, but it seems as though her renal function has been fluctuating since that time prompting many ICU transfers for agressive diuresis.  - Continue Diuresis, with Lasix - Nephrology following , poor candidate for dialysis  Acidosis: mixed metabolic and respiratory: Improved - Continue bicarb tabs, off drip  Acute on chronic respiratory failure Tracheostomy status, was on ATC prior to admission - ATC as able. -TracheoBronchial toilet, added saline nebs  DM:  CBG monitoring , up to 180 acceptable and SSI.  Elevated lipase: biliary stent removed in December. - trend lipase. - hold off on imaging for now.  Summary-septic shock appears to have resolved but worsening azotemia and anasarca, very poor candidate for dialysis in my opinion with daughters want to push forward  Best practice:  Diet: NPO, TFs Pain/Anxiety/Delirium protocol (if indicated): NA VAP protocol (if indicated): NA DVT prophylaxis: heparin SQ GI prophylaxis: NA Glucose control: SSI Mobility: BR Code Status: FULL Family Communication: daughter. Disposition: ICU  The patient is critically ill with multiple organ systems failure and requires high  complexity decision making for assessment and support, frequent evaluation and titration of therapies, application of advanced monitoring technologies and extensive interpretation of multiple databases. Critical Care Time devoted to patient care services described in this note independent of APP/resident  time is 32 minutes.    Kara Mead MD. [place]. [facility]  If no response to pager , please call [phone]     01/08/2020, 9:04 AM

## 2020-01-09 ENCOUNTER — Inpatient Hospital Stay (HOSPITAL_COMMUNITY): Payer: Medicare Other

## 2020-01-09 DIAGNOSIS — N19 Unspecified kidney failure: Secondary | ICD-10-CM | POA: Diagnosis not present

## 2020-01-09 DIAGNOSIS — A419 Sepsis, unspecified organism: Secondary | ICD-10-CM | POA: Diagnosis not present

## 2020-01-09 LAB — GLUCOSE, CAPILLARY
Glucose-Capillary: 92 mg/dL (ref 70–99)
Glucose-Capillary: 93 mg/dL (ref 70–99)
Glucose-Capillary: 116 mg/dL — ABNORMAL HIGH (ref 70–99)
Glucose-Capillary: 117 mg/dL — ABNORMAL HIGH (ref 70–99)
Glucose-Capillary: 142 mg/dL — ABNORMAL HIGH (ref 70–99)
Glucose-Capillary: 144 mg/dL — ABNORMAL HIGH (ref 70–99)

## 2020-01-09 LAB — RENAL FUNCTION PANEL
Albumin: 2 g/dL — ABNORMAL LOW (ref 3.5–5.0)
Anion gap: 17 — ABNORMAL HIGH (ref 5–15)
BUN: 138 mg/dL — ABNORMAL HIGH (ref 8–23)
CO2: 23 mmol/L (ref 22–32)
Calcium: 9.5 mg/dL (ref 8.9–10.3)
Chloride: 97 mmol/L — ABNORMAL LOW (ref 98–111)
Creatinine, Ser: 2.99 mg/dL — ABNORMAL HIGH (ref 0.44–1.00)
GFR calc Af Amer: 17 mL/min — ABNORMAL LOW (ref >60)
GFR calc non Af Amer: 15 mL/min — ABNORMAL LOW (ref >60)
Glucose, Bld: 114 mg/dL — ABNORMAL HIGH (ref 70–99)
Phosphorus: 6.6 mg/dL — ABNORMAL HIGH (ref 2.5–4.6)
Potassium: 4.4 mmol/L (ref 3.5–5.1)
Sodium: 137 mmol/L (ref 135–145)

## 2020-01-09 LAB — CBC
HCT: 22.8 % — ABNORMAL LOW (ref 36.0–46.0)
HCT: 23.7 % — ABNORMAL LOW (ref 36.0–46.0)
Hemoglobin: 7.2 g/dL — ABNORMAL LOW (ref 12.0–15.0)
Hemoglobin: 7.5 g/dL — ABNORMAL LOW (ref 12.0–15.0)
MCH: 28.2 pg (ref 26.0–34.0)
MCH: 28.2 pg (ref 26.0–34.0)
MCHC: 31.6 g/dL (ref 30.0–36.0)
MCHC: 31.6 g/dL (ref 30.0–36.0)
MCV: 89.4 fL (ref 80.0–100.0)
MCV: 89.1 fL (ref 80.0–100.0)
Platelets: 83 10*3/uL — ABNORMAL LOW (ref 150–400)
Platelets: 67 10*3/uL — ABNORMAL LOW (ref 150–400)
RBC: 2.55 MIL/uL — ABNORMAL LOW (ref 3.87–5.11)
RBC: 2.66 MIL/uL — ABNORMAL LOW (ref 3.87–5.11)
RDW: 18.7 % — ABNORMAL HIGH (ref 11.5–15.5)
RDW: 18.6 % — ABNORMAL HIGH (ref 11.5–15.5)
WBC: 7.3 10*3/uL (ref 4.0–10.5)
WBC: 7.3 10*3/uL (ref 4.0–10.5)
nRBC: 0 % (ref 0.0–0.2)
nRBC: 0 % (ref 0.0–0.2)

## 2020-01-09 LAB — BASIC METABOLIC PANEL
Anion gap: 19 — ABNORMAL HIGH (ref 5–15)
BUN: 152 mg/dL — ABNORMAL HIGH (ref 8–23)
CO2: 22 mmol/L (ref 22–32)
Calcium: 9.5 mg/dL (ref 8.9–10.3)
Chloride: 98 mmol/L (ref 98–111)
Creatinine, Ser: 3.08 mg/dL — ABNORMAL HIGH (ref 0.44–1.00)
GFR calc Af Amer: 17 mL/min — ABNORMAL LOW (ref >60)
GFR calc non Af Amer: 15 mL/min — ABNORMAL LOW (ref >60)
Glucose, Bld: 101 mg/dL — ABNORMAL HIGH (ref 70–99)
Potassium: 4.6 mmol/L (ref 3.5–5.1)
Sodium: 139 mmol/L (ref 135–145)

## 2020-01-09 LAB — LIPASE, BLOOD: Lipase: 268 U/L — ABNORMAL HIGH (ref 11–51)

## 2020-01-09 LAB — HEPATITIS PANEL, ACUTE
HCV Ab: NONREACTIVE
Hep A IgM: NONREACTIVE
Hep B C IgM: NONREACTIVE
Hepatitis B Surface Ag: NONREACTIVE

## 2020-01-09 LAB — HEPATITIS B CORE ANTIBODY, TOTAL: Hep B Core Total Ab: NONREACTIVE

## 2020-01-09 LAB — PHOSPHORUS: Phosphorus: 6.9 mg/dL — ABNORMAL HIGH (ref 2.5–4.6)

## 2020-01-09 LAB — MAGNESIUM: Magnesium: 2.3 mg/dL (ref 1.7–2.4)

## 2020-01-09 MED ORDER — LIDOCAINE HCL (PF) 1 % IJ SOLN: 5.0000 mL | INTRAMUSCULAR | Status: DC | PRN

## 2020-01-09 MED ORDER — LIDOCAINE-PRILOCAINE 2.5-2.5 % EX CREA
1.0000 "application " | TOPICAL_CREAM | CUTANEOUS | Status: DC | PRN
  Filled 2020-01-09: qty 5

## 2020-01-09 MED ORDER — VITAL 1.5 CAL PO LIQD
1000.0000 mL | ORAL | Status: DC
  Administered 2020-01-09 – 2020-01-11 (×3): 1000 mL
  Filled 2020-01-09 (×4): qty 1000

## 2020-01-09 MED ORDER — ALTEPLASE 2 MG IJ SOLR: 2.0000 mg | Freq: Once | INTRAMUSCULAR | Status: DC | PRN

## 2020-01-09 MED ORDER — SODIUM CHLORIDE 0.9 % IV SOLN
100.0000 mL | INTRAVENOUS | Status: DC | PRN
  Administered 2020-01-10: 100 mL via INTRAVENOUS

## 2020-01-09 MED ORDER — HEPARIN SODIUM (PORCINE) 1000 UNIT/ML IJ SOLN
INTRAMUSCULAR | Status: AC
  Filled 2020-01-09: qty 4

## 2020-01-09 MED ORDER — PENTAFLUOROPROP-TETRAFLUOROETH EX AERO: 1.0000 "application " | INHALATION_SPRAY | CUTANEOUS | Status: DC | PRN

## 2020-01-09 MED ORDER — PRO-STAT SUGAR FREE PO LIQD
60.0000 mL | Freq: Two times a day (BID) | ORAL | Status: DC
  Administered 2020-01-09 – 2020-01-17 (×17): 60 mL
  Administered 2020-01-17: 30 mL
  Administered 2020-01-18 – 2020-01-20 (×5): 60 mL
  Filled 2020-01-09 (×24): qty 60

## 2020-01-09 MED ORDER — HEPARIN SODIUM (PORCINE) 1000 UNIT/ML DIALYSIS
1000.0000 [IU] | INTRAMUSCULAR | Status: DC | PRN
  Administered 2020-01-15: 17:00:00 1000 [IU] via INTRAVENOUS_CENTRAL
  Filled 2020-01-09 (×4): qty 1

## 2020-01-09 NOTE — Progress Notes (Signed)
NAME:  Bailey Cox, MRN:  [id number], DOB:  30-Apr-1946, LOS: 4 ADMISSION DATE:  01/05/2020, CONSULTATION DATE:  01/06/20 REFERRING MD:  Tegeler EDP, CHIEF COMPLAINT:   Shock  Brief History   74 year old female admitted 3/1 from [place] where she had just arrived after long admission to [facility] where she suffered PEA arrest during endoscopy and ended up requiring tracheostomy. Presents to [facility] ED with widespread peripheral edema and acidosis.   History of present illness   74 year old female with PMH as below, which is significant for long admission in the [facility] system from 10/2019 all the way through 01/05/2020.  She was originally hospitalized in September 2020 with acute cholecystitis and underwent a cholecystectomy.  She was then sent to SNF for short duration but had return to [facility] to have a bile duct stent placed ultimately returning to SNF.  She then returned to [facility] with bowel dilation secondary to Ogilvie's syndrome.  While hospitalized she developed fluid overload and was ultimately transferred to [facility] for dialysis.  Her course after that point become somewhat unclear, but as described by her family she was on and off the ventilator for several procedures and was eventually left intubated.  One of these procedures was an endoscopy during which she unfortunately suffered a cardiac arrest.  Details of the arrest are unclear.  She remained on the ventilator and ultimately underwent tracheostomy.  Her hospital course was also complicated by MRSA pneumonia.  She was ultimately able to come off of hemodialysis and was treated intermittently with diuresis.  It sounds like she bounced in and out of the ICU a few times with volume overload.  On the tail end of her admission she still struggled with kidney disease and was being treated for a urinary tract infection with cefepime and vancomycin.  Plans were to stop on 3/2.  She was discharged from the [facility] system on 3/1 to  [facility] in [place].  Upon arrival to [facility] they deemed her too sick for admission and transferred her to [facility] emergency department with complaints of volume overload and acidosis.   Past Medical History   has a past medical history of Chronic respiratory failure ([place]), CKD (chronic kidney disease), DM (diabetes mellitus) ([place]), GERD (gastroesophageal reflux disease), HTN (hypertension), Ogilvie syndrome, Tracheostomy status ([place]), and Vascular dementia ([place]).  Significant Hospital Events   07/2019 admit to [facility] for cholecystitis  09/2019 tx to [facility] for renal failure/volume overload. HD started.  10/2019 PEA arrest during EGD . Intubated 11/2019 Trach. HD stopped.   Consults:    Procedures:  PICC pre admit > Trach pre admit >  Significant Diagnostic Tests:   Echo 10/24/2019 > LVEF 55%, Grade 1 DD. RV systolic function normal.  Echo 3/2: LVEF 65-70%, grade II dysfunction. RVSP  Micro Data:  Blood 3/1 >> ng Urine 3/1 >> ng resp 3/4 >> rare gpc  Antimicrobials:  Meropenem 2/21 >> off  Cefepime Pre hosp (for tracheitis )  >> 3/1-> Vancomycin pre hosp >> 3/2   Interim history/subjective:  3/5: no acute events overnight. Per nephro notes, not a good candidate for dialysis but with worsening uremia we are starting today after line placement.   3/4:Afebrile Critically ill, on vent    Objective   Blood pressure (!) 124/53, pulse 77, temperature (!) 97.5 F (36.4 C), resp. rate 18, height 5\' 4"  (1.626 m), weight 116.1 kg, SpO2 100 %.    Vent Mode: SIMV;PSV;PRVC  FiO2 (%):  [40 %-60 %] 40 % Set Rate:  [16 bmp] 16 bmp Vt Set:  [360 mL] 360 mL PEEP:  [5 cmH20] 5 cmH20 Pressure Support:  [20 cmH20] 20 cmH20 Plateau Pressure:  [16 cmH20] 16 cmH20   Intake/Output Summary (Last 24 hours) at 01/09/2020 0816 Last data filed at 01/09/2020 0700 Gross per 24 hour  Intake 734.71 ml  Output 1040 ml  Net -305.29 ml   Filed Weights   01/05/20 1934 01/07/20 0347  01/08/20 0500  Weight: 113 kg 117.2 kg 116.1 kg    Examination: General: Obese elderly woman  with trach. HENT: [place]/AT, PERRL, no JVD. Lungs:diffuse rhonchi, copious secretions Cardiovascular: RRR, no MRG. Marked anasarca Abdomen: obese Soft, non-tender, non-distended. Extremities: 4+ pitting edema. L arm with multiple areas of ecchymosis Neuro: Awake, eyes open. Not following commands, GU: Foley.    Lab Results  Component Value Date   WBC 7.3 01/09/2020   HGB 7.2 (L) 01/09/2020   HCT 22.8 (L) 01/09/2020   MCV 89.4 01/09/2020   PLT 83 (L) 01/09/2020   Lab Results  Component Value Date   CREATININE 3.08 (H) 01/09/2020   BUN 152 (H) 01/09/2020   NA 139 01/09/2020   K 4.6 01/09/2020   CL 98 01/09/2020   CO2 22 01/09/2020     Assessment & Plan:   Septic shock-UTI was treated with meropenem 2/21 No evidence of HAP on CXR but copious secretions ?  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  -no further lasix -Nephrology following, they state "poor candidate for dialysis" -placing vas cath today and will start dialysis per nephro.   Acidosis: resolved  Acute on chronic respiratory failure Tracheostomy status, was on ATC prior to admission - ATC as able. -TracheoBronchial toilet, added saline nebs  DM:  CBG monitoring , up to 180 acceptable and SSI.  Elevated lipase: biliary stent removed in December. - trend lipase 422->268. - hold off on imaging for now.  Normocytic anemia:  -transfuse <7 -no overt bleeding noted.  Thrombocytopenia:  -follow -no acute indication for transfusion -stable value since presentation -suspect 2/2 acute illness.    Best practice:  Diet: NPO, TFs Pain/Anxiety/Delirium protocol (if  indicated): NA VAP protocol (if indicated): NA DVT prophylaxis: heparin SQ GI prophylaxis: NA Glucose control: SSI Mobility: BR Code Status: FULL Family Communication: daughter via phone 3/5 Disposition: ICU  Critical care time: The patient is critically ill with multiple organ systems failure and requires high complexity decision making for assessment and support, frequent evaluation and titration of therapies, application of advanced monitoring technologies and extensive interpretation of multiple databases.  Critical care time 38 mins. This represents my time independent of the NPs time taking care of the pt. This is excluding procedures.    Audria Nine DO [place] [facility] 01/09/2020, 8:16 AM

## 2020-01-09 NOTE — Progress Notes (Signed)
[facility] Progress Note   74 y.o.femalewho had a prolonged 81 day hospitalization at [facility] with a history of HTN COPD DM hypothyroidism Ogilvie's syndrome who was transferred to [place] on 12/02/2019. PEA arrest during EGD in early December with duodenal ulcer noted on EGD.Intermittent hyperkalemia treatet medically w/ Kayexalate and now recently w/ worsening renal function over the past few days prior to admission to [facility]. She actually had a foley placed 12/28/19 when the renal function was worsening with return of clear purulence and then started on Meropenem. Per review of chart the pt did not want RRT but after urging by family agreed to it. She was also started on Cefepime and Vanco for tracheitis in Feb.  Assessment/ Plan:   1. AKI on CKD with most likely cause of the AKI being ATN from sepsis.  - She is not a good candidate for dialysis.  - Off HCO3 gtt and now on NaHCO3 tabs + Lasix  to manage the acidosis and vol status. - Responding to Lasix w/ good UOP but getting  Azotemic and less arousable this am.  - [name] [phone] and [name] [phone]; both have POA.  I spoke with Katharine Look today and they've never refused RRT; actually was on dialysis for a mth at [facility].  We will challenge with iHD today and tomorrow. No more  Lasix for now.  Appreciate CCM placing the temp cath.  2. Sepsis - on pressors and broad spectrum abx (cefepime). Reculturing. 3. ?urosepsis - will recheck urinalysis microscopy and culture, cefepime vanc on 2/25 to 3/2 -> stopped the vanco w/ worsening renal function. 4. H/o PEA arrest 5. MRSA PNA on vanc/cefepime -> cefepime 6. Respiratory failure on trach 7. HFpEF  Subjective:   Off Levophed  Had 775 ml/1525/1040  3 days  UOP   Objective:   BP (!) 124/53   Pulse 77   Temp (!) 97.5 F (36.4 C)   Resp 18   Ht '5\' 4"'$  (1.626 m)   Wt 116.1 kg   SpO2 100%   BMI 43.93  kg/m   Intake/Output Summary (Last 24 hours) at 01/09/2020 0749 Last data filed at 01/09/2020 0700 Gross per 24 hour  Intake 824.71 ml  Output 1040 ml  Net -215.29 ml   Weight change:   Physical Exam: GEN: NAD,trached HEENT: No conjunctival pallor, EOMI NECK: Supple, no thyromegaly LUNGS: CTA B/L  CV: RRR, No M/R/G ABD: SNDNT +BS  EXT:1+ lowerextremity edema  Imaging: No results found.  Labs: BMET Recent Labs  Lab 01/05/20 2004 01/05/20 2004 01/05/20 2030 01/06/20 0406 01/06/20 1305 01/06/20 1830 01/07/20 0342 01/08/20 0559 01/09/20 0554  NA 132*   < > 128* 133* 132* 132* 134* 136 139  K 4.9   < > 6.3* 5.3* 4.6 4.8 4.1 4.4 4.6  CL 101  --   --  99  --  96* 96* 98 98  CO2 11*  --   --  14*  --  17* 20* 22 22  GLUCOSE 113*  --   --  143*  --  93 114* 179* 101*  BUN 128*  --   --  131*  --  133* 132* 140* 152*  CREATININE 2.47*  --   --  2.46*  --  2.60* 2.61* 2.85* 3.08*  CALCIUM 9.7  --   --  9.9  --  9.3 9.1 8.9 9.5  PHOS  --   --   --  8.8*  --  8.2* 7.8* 6.7* 6.9*   < > = values in this interval not displayed.   CBC Recent Labs  Lab 01/05/20 2004 01/05/20 2030 01/06/20 0406 01/06/20 0406 01/06/20 1305 01/08/20 0559 01/08/20 0820 01/09/20 0554  WBC 9.3   < > 17.1*  --   --  7.7 10.7* 7.3  NEUTROABS 7.9*  --   --   --   --   --   --   --   HGB 9.4*   < > 9.7*   < > 9.2* 6.7* 8.2* 7.2*  HCT 30.9*   < > 31.4*   < > 27.0* 21.3* 25.7* 22.8*  MCV 93.1   < > 91.3  --   --  88.8 89.5 89.4  PLT 89*   < > 137*  --   --  85* 103* 83*   < > = values in this interval not displayed.    Medications:    . chlorhexidine gluconate (MEDLINE KIT)  15 mL Mouth Rinse BID  . Chlorhexidine Gluconate Cloth  6 each Topical Daily  . Chlorhexidine Gluconate Cloth  6 each Topical Q0600  . heparin  5,000 Units Subcutaneous Q8H  . insulin aspart  0-15 Units Subcutaneous Q4H  . mouth rinse  15 mL Mouth Rinse 10 times per day  . mupirocin ointment  1 application Nasal BID   . pantoprazole sodium  40 mg Per Tube Daily  . sodium bicarbonate  650 mg Per Tube BID  . sodium chloride flush  10-40 mL Intracatheter Q12H      Otelia Santee, MD 01/09/2020, 7:49 AM

## 2020-01-09 NOTE — Progress Notes (Signed)
HD catheter non patent r/t positional. Dr. Augustin Coupe notified.

## 2020-01-09 NOTE — Procedures (Signed)
Hemodialysis Insertion Procedure Note Laneya Gasaway [id number] 06-30-1946  Procedure: Insertion of Hemodialysis Catheter Type: 3 port  Indications: Hemodialysis   Procedure Details Consent: Risks of procedure as well as the alternatives and risks of each were explained to the (patient/caregiver).  Consent for procedure obtained. Time Out: Verified patient identification, verified procedure, site/side was marked, verified correct patient position, special equipment/implants available, medications/allergies/relevent history reviewed, required imaging and test results available.  Performed  Maximum sterile technique was used including antiseptics, cap, gloves, gown, hand hygiene, mask and sheet. Skin prep: Chlorhexidine; local anesthetic administered A antimicrobial bonded/coated triple lumen catheter was placed in the right internal jugular vein using the Seldinger technique. Ultrasound guidance used.Yes.   Catheter placed to 16 cm. Blood aspirated via all 3 ports and then flushed x 3. Line sutured x 2 and dressing applied.   Rt I J V Evaluation Blood flow good Complications: No apparent complications Patient did tolerate procedure well. Chest X-ray ordered to verify placement.  CXR: pending.  Richardson Landry Akil Hoos ACNP Acute Care Nurse Practitioner [place] Please consult Amion 01/09/2020, 9:47 AM

## 2020-01-10 ENCOUNTER — Inpatient Hospital Stay (HOSPITAL_COMMUNITY): Payer: Medicare Other

## 2020-01-10 ENCOUNTER — Encounter (HOSPITAL_COMMUNITY): Payer: Self-pay | Admitting: Internal Medicine

## 2020-01-10 DIAGNOSIS — M7989 Other specified soft tissue disorders: Secondary | ICD-10-CM | POA: Diagnosis not present

## 2020-01-10 DIAGNOSIS — J9601 Acute respiratory failure with hypoxia: Secondary | ICD-10-CM

## 2020-01-10 HISTORY — PX: IR US GUIDE VASC ACCESS RIGHT: IMG2390

## 2020-01-10 HISTORY — PX: IR FLUORO GUIDE CV LINE RIGHT: IMG2283

## 2020-01-10 LAB — CULTURE, BLOOD (ROUTINE X 2)
Culture: NO GROWTH
Culture: NO GROWTH
Special Requests: ADEQUATE
Special Requests: ADEQUATE

## 2020-01-10 LAB — CULTURE, RESPIRATORY W GRAM STAIN

## 2020-01-10 LAB — BASIC METABOLIC PANEL
Anion gap: 14 (ref 5–15)
BUN: 127 mg/dL — ABNORMAL HIGH (ref 8–23)
CO2: 20 mmol/L — ABNORMAL LOW (ref 22–32)
Calcium: 8.2 mg/dL — ABNORMAL LOW (ref 8.9–10.3)
Chloride: 107 mmol/L (ref 98–111)
Creatinine, Ser: 2.73 mg/dL — ABNORMAL HIGH (ref 0.44–1.00)
GFR calc Af Amer: 19 mL/min — ABNORMAL LOW (ref >60)
GFR calc non Af Amer: 17 mL/min — ABNORMAL LOW (ref >60)
Glucose, Bld: 136 mg/dL — ABNORMAL HIGH (ref 70–99)
Potassium: 3.9 mmol/L (ref 3.5–5.1)
Sodium: 141 mmol/L (ref 135–145)

## 2020-01-10 LAB — GLUCOSE, CAPILLARY
Glucose-Capillary: 120 mg/dL — ABNORMAL HIGH (ref 70–99)
Glucose-Capillary: 143 mg/dL — ABNORMAL HIGH (ref 70–99)
Glucose-Capillary: 140 mg/dL — ABNORMAL HIGH (ref 70–99)
Glucose-Capillary: 156 mg/dL — ABNORMAL HIGH (ref 70–99)
Glucose-Capillary: 155 mg/dL — ABNORMAL HIGH (ref 70–99)
Glucose-Capillary: 134 mg/dL — ABNORMAL HIGH (ref 70–99)

## 2020-01-10 LAB — PHOSPHORUS: Phosphorus: 6.4 mg/dL — ABNORMAL HIGH (ref 2.5–4.6)

## 2020-01-10 LAB — MAGNESIUM: Magnesium: 2.4 mg/dL (ref 1.7–2.4)

## 2020-01-10 LAB — HEPATITIS B SURFACE ANTIBODY, QUANTITATIVE: Hep B S AB Quant (Post): <3.1 m[IU]/mL — ABNORMAL LOW (ref >9.9)

## 2020-01-10 LAB — HEPATITIS B E ANTIGEN: Hep B E Ag: NEGATIVE

## 2020-01-10 MED ORDER — PHENYLEPHRINE HCL-NACL 10-0.9 MG/250ML-% IV SOLN
INTRAVENOUS | Status: AC
  Administered 2020-01-10: 20 ug/min via INTRAVENOUS
  Filled 2020-01-10: qty 250

## 2020-01-10 MED ORDER — PHENYLEPHRINE HCL-NACL 10-0.9 MG/250ML-% IV SOLN
0.0000 ug/min | INTRAVENOUS | Status: DC
  Administered 2020-01-10: 65 ug/min via INTRAVENOUS
  Administered 2020-01-10: 04:00:00 60 ug/min via INTRAVENOUS
  Administered 2020-01-10: 20 ug/min via INTRAVENOUS
  Administered 2020-01-10: 35 ug/min via INTRAVENOUS
  Filled 2020-01-10: qty 250
  Filled 2020-01-10: qty 750
  Filled 2020-01-10: qty 250

## 2020-01-10 MED ORDER — MIDODRINE HCL 5 MG PO TABS
10.0000 mg | ORAL_TABLET | Freq: Three times a day (TID) | ORAL | Status: DC
  Administered 2020-01-10 (×3): 10 mg
  Filled 2020-01-10 (×3): qty 2

## 2020-01-10 MED ORDER — CHLORHEXIDINE GLUCONATE 4 % EX LIQD
CUTANEOUS | Status: AC
  Filled 2020-01-10: qty 15

## 2020-01-10 MED ORDER — MIDAZOLAM HCL 2 MG/2ML IJ SOLN
INTRAMUSCULAR | Status: AC
  Filled 2020-01-10: qty 2

## 2020-01-10 MED ORDER — FENTANYL CITRATE (PF) 100 MCG/2ML IJ SOLN
INTRAMUSCULAR | Status: AC | PRN
  Administered 2020-01-10: 25 ug via INTRAVENOUS

## 2020-01-10 MED ORDER — HEPARIN SODIUM (PORCINE) 1000 UNIT/ML IJ SOLN
INTRAMUSCULAR | Status: AC
  Filled 2020-01-10: qty 1

## 2020-01-10 MED ORDER — FENTANYL CITRATE (PF) 100 MCG/2ML IJ SOLN
INTRAMUSCULAR | Status: AC
  Filled 2020-01-10: qty 2

## 2020-01-10 MED ORDER — VANCOMYCIN HCL IN DEXTROSE 1-5 GM/200ML-% IV SOLN: 1000.0000 mg | Freq: Once | INTRAVENOUS | Status: DC

## 2020-01-10 MED ORDER — LIDOCAINE HCL 1 % IJ SOLN
INTRAMUSCULAR | Status: AC
  Filled 2020-01-10: qty 20

## 2020-01-10 MED ORDER — MIDAZOLAM HCL 2 MG/2ML IJ SOLN
INTRAMUSCULAR | Status: AC | PRN
  Administered 2020-01-10: 0.5 mg via INTRAVENOUS

## 2020-01-10 NOTE — Progress Notes (Signed)
NAME:  Bailey Cox, MRN:  [id number], DOB:  Feb 06, 1946, LOS: 5 ADMISSION DATE:  01/05/2020, CONSULTATION DATE:  01/06/20 REFERRING MD:  [place] EDP, CHIEF COMPLAINT:   Shock  Brief History   74 year old female admitted 3/1 from [place] where she had just arrived after long admission to [facility] where she suffered PEA arrest during endoscopy and ended up requiring tracheostomy. Presents to [facility] ED with widespread peripheral edema and acidosis.   History of present illness   74 year old female with PMH as below, which is significant for long admission in the [facility] system from 10/2019 all the way through 01/05/2020.  She was originally hospitalized in September 2020 with acute cholecystitis and underwent a cholecystectomy.  She was then sent to SNF for short duration but had return to [facility] to have a bile duct stent placed ultimately returning to SNF.  She then returned to [facility] with bowel dilation secondary to Ogilvie's syndrome.  While hospitalized she developed fluid overload and was ultimately transferred to [facility] for dialysis.  Her course after that point become somewhat unclear, but as described by her family she was on and off the ventilator for several procedures and was eventually left intubated.  One of these procedures was an endoscopy during which she unfortunately suffered a cardiac arrest.  Details of the arrest are unclear.  She remained on the ventilator and ultimately underwent tracheostomy.  Her hospital course was also complicated by MRSA pneumonia.  She was ultimately able to come off of hemodialysis and was treated intermittently with diuresis.  It sounds like she bounced in and out of the ICU a few times with volume overload.  On the tail end of her admission she still struggled with kidney disease and was being treated for a urinary tract infection with cefepime and vancomycin.  Plans were to stop on 3/2.  She was discharged from the [facility] system on 3/1 to  [facility] in [place].  Upon arrival to [facility] they deemed her too sick for admission and transferred her to [facility] emergency department with complaints of volume overload and acidosis.   Past Medical History   has a past medical history of Chronic respiratory failure ([place]), CKD (chronic kidney disease), DM (diabetes mellitus) ([place]), GERD (gastroesophageal reflux disease), HTN (hypertension), Ogilvie syndrome, Tracheostomy status ([place]), and Vascular dementia ([place]).  Significant Hospital Events   07/2019 admit to [facility] for cholecystitis  09/2019 tx to [facility] for renal failure/volume overload. HD started.  10/2019 PEA arrest during EGD . Intubated 11/2019 Trach. HD stopped.   Consults:    Procedures:  PICC pre admit > Trach pre admit > 3/5 IJ vas cath->  Significant Diagnostic Tests:   Echo 10/24/2019 > LVEF 55%, Grade 1 DD. RV systolic function normal.  Echo 3/2: LVEF 65-70%, grade II dysfunction. RVSP  Micro Data:  Blood 3/1 >> ng Urine 3/1 >> ng resp 3/4 >> rare gpc  Antimicrobials:  Meropenem 2/21 >> off  Cefepime Pre hosp (for tracheitis )  >> 3/1-> Vancomycin pre hosp >> 3/2   Interim history/subjective:  3/6: vas cath placed at bedside "not functioning". Have had nephro request IR to re-eval, appears in good position per xray. Indices improving (somewhat) however without lasix and without dialysis, defer to nephro. Started on neo overnight. tmax 99.1 overnight without wbc elevation. Unclear what would precipitate hypotension.   3/5: no acute events overnight. Per nephro notes, not a good candidate for dialysis but with  worsening uremia we are starting today after line placement.   3/4:Afebrile Critically ill, on vent    Objective   Blood pressure (!) 114/52, pulse (!) 106, temperature 98.6 F (37 C), resp. rate (!) 27, height 5\' 4"  (1.626 m), weight 111.7 kg, SpO2 93 %.    Vent Mode: SIMV;PRVC;PSV FiO2 (%):  [40 %] 40 % Set Rate:  [16 bmp] 16  bmp Vt Set:  [360 mL] 360 mL PEEP:  [5 cmH20] 5 cmH20 Pressure Support:  [20 cmH20] 20 cmH20 Plateau Pressure:  [17 cmH20-24 cmH20] 24 cmH20   Intake/Output Summary (Last 24 hours) at 01/10/2020 0825 Last data filed at 01/10/2020 0700 Gross per 24 hour  Intake 1397.29 ml  Output 1437 ml  Net -39.71 ml   Filed Weights   01/09/20 1130 01/09/20 1240 01/10/20 0318  Weight: 116 kg 116.2 kg 111.7 kg    Examination: General: Obese elderly woman  with trach.on vent  HENT: [place]/AT, PERRL, no JVD. Lungs:rhonchi bilaterally Cardiovascular: RRR, no MRG. Marked anasarca Abdomen: obese Soft, non-tender, non-distended. Extremities: 4+ pitting edema. L arm with multiple areas of ecchymosis Neuro: Arousable and opens eyes to verbal stim.  Not following commands, GU: Foley.    Lab Results  Component Value Date   WBC 7.3 01/09/2020   HGB 7.5 (L) 01/09/2020   HCT 23.7 (L) 01/09/2020   MCV 89.1 01/09/2020   PLT 67 (L) 01/09/2020   Lab Results  Component Value Date   CREATININE 2.99 (H) 01/09/2020   BUN 138 (H) 01/09/2020   NA 137 01/09/2020   K 4.4 01/09/2020   CL 97 (L) 01/09/2020   CO2 23 01/09/2020     Assessment & Plan:   Septic shock-UTI was treated with meropenem 2/21 No evidence of HAP on CXR but copious secretions ?  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  -no further lasix -Nephrology following, they state "poor candidate for dialysis" -placed vascath yesterday but not functional so IR to re-assess today.   Acidosis: resolved  Acute on chronic respiratory failure Tracheostomy status, was on ATC prior to admission - ATC as able. -TracheoBronchial toilet, added saline nebs  DM:  CBG monitoring , up to 180 acceptable and  SSI.  Elevated lipase: biliary stent removed in December. - trend lipase 422->268. - hold off on imaging for now.  Normocytic anemia:  -transfuse <7 -no overt bleeding noted.  Thrombocytopenia:  -follow -no acute indication for transfusion -hold heparin today with cont drop.  -will get LE and UE dopplers and send hit ab but hold on any addition of agent at this time.  -suspect 2/2 acute illness.    Best practice:  Diet: NPO, TFs Pain/Anxiety/Delirium protocol (if indicated): NA VAP protocol (if indicated): NA DVT prophylaxis: heparin SQ... held GI prophylaxis: NA Glucose control: SSI Mobility: BR Code Status: FULL Family Communication: daughter via phone 3/6 Disposition: ICU  Critical care time: The patient is critically ill with multiple organ systems failure and requires high complexity decision making for assessment and support, frequent evaluation and titration of therapies, application of advanced monitoring technologies and extensive interpretation of multiple databases.  Critical care time 40 mins. This represents my time independent of the NPs time taking care of the pt. This is excluding  procedures.    Audria Nine DO [facility] 01/10/2020, 8:25 AM

## 2020-01-10 NOTE — Progress Notes (Signed)
[facility] Progress Note   74 y.o.femalewho had a prolonged 81 day hospitalization at [facility] with a history of HTN COPD DM hypothyroidism Ogilvie's syndrome who was transferred to [place] on 12/02/2019. PEA arrest during EGD in early December with duodenal ulcer noted on EGD.Intermittent hyperkalemia treatet medically w/ Kayexalate and now recently w/ worsening renal function over the past few days prior to admission to [facility]. She actually had a foley placed 12/28/19 when the renal function was worsening with return of clear purulence and then started on Meropenem. Per review of chart the pt did not want RRT but after urging by family agreed to it. She was also started on Cefepime and Vanco for tracheitis in Feb.  Assessment/ Plan:   1. AKI on CKD with most likely cause of the AKI being ATN from sepsis.  - She is not a good candidate for dialysis.  - [name] [phone] and [name] [phone]; both have POA.  I spoke with Katharine Look 3/4 and they've never refused RRT; actually was on dialysis for a mth [facility].  - Last  Lasix 3/3 with still good UOP off Lasix.  Attempted iHD 3/5 but short treatment as cath was not working -> cath has since been converted by VIR to TC (Much appreciated)   Continue holding Lasix, hold RRT for now and will follow function daily. Hopefully we can avoid RRT this hospitalization. Will repeat chemistry panel in the AM.  2. Sepsis - on pressors and broad spectrum abx(cefepime). Reculturing. 3. ?urosepsis - will recheck urinalysis microscopy and culture, cefepime vanc on 2/25 to 3/2 -> stopped the vanco w/ worsening renal function. 4. H/o PEA arrest 5. MRSA PNA on vanc/cefepime -> cefepime 6. Respiratory failure on trach 7. HFpEF  Subjective:   On Neo. Returned from [employer] with RIJ TC   Objective:   BP (!) 161/85   Pulse 88   Temp 98.6 F (37 C)   Resp 12   Ht '5\' 4"'$  (1.626 m)   Wt  111.7 kg   SpO2 100%   BMI 42.27 kg/m   Intake/Output Summary (Last 24 hours) at 01/10/2020 1102 Last data filed at 01/10/2020 0800 Gross per 24 hour  Intake 1382.52 ml  Output 1237 ml  Net 145.52 ml   Weight change:   Physical Exam: GEN: NAD,trached HEENT: No conjunctival pallor, EOMI NECK: Supple, no thyromegaly LUNGS: CTA B/L  CV: RRR, No M/R/G ABD: SNDNT +BS  EXT:1-2+ lowerextremity edema ACCESS: RIJ TC  Imaging: DG CHEST PORT 1 VIEW  Result Date: 01/09/2020 CLINICAL DATA:  Central line placement EXAM: PORTABLE CHEST 1 VIEW COMPARISON:  01/09/2020 FINDINGS: Right internal jugular Vas-Cath in place with the tip in the SVC. No pneumothorax. Tracheostomy and right PICC line remain in place, unchanged. Layering bilateral effusions with bilateral airspace disease again noted, unchanged. IMPRESSION: Interval placement of right internal jugular Vas-Cath with the tip in the SVC. No pneumothorax. Otherwise no change. Electronically Signed   By: Rolm Baptise M.D.   On: 01/09/2020 10:03   DG Chest Port 1 View  Result Date: 01/09/2020 CLINICAL DATA:  Acute respiratory failure. EXAM: PORTABLE CHEST 1 VIEW COMPARISON:  One-view chest x-ray 01/05/2020 FINDINGS: Tracheostomy tube is stable in position. Right-sided PICC line is in place. Heart size is normal. Diffuse interstitial and airspace disease is stable. Bilateral effusions are again noted. IMPRESSION: 1. Stable appearance of diffuse interstitial and airspace disease and bilateral effusions. This likely represents edema. Infection  is not excluded. 2. Support apparatus is stable. Electronically Signed   By: San Morelle M.D.   On: 01/09/2020 08:32    Labs: BMET Recent Labs  Lab 01/05/20 2004 01/05/20 2030 01/06/20 0406 01/06/20 1305 01/06/20 1830 01/07/20 0342 01/08/20 0559 01/09/20 0554 01/09/20 1430 01/10/20 0417  NA 132*   < > 133* 132* 132* 134* 136 139 137  --   K 4.9   < > 5.3* 4.6 4.8 4.1 4.4 4.6 4.4  --   CL  101  --  99  --  96* 96* 98 98 97*  --   CO2 11*  --  14*  --  17* 20* '22 22 23  '$ --   GLUCOSE 113*  --  143*  --  93 114* 179* 101* 114*  --   BUN 128*  --  131*  --  133* 132* 140* 152* 138*  --   CREATININE 2.47*  --  2.46*  --  2.60* 2.61* 2.85* 3.08* 2.99*  --   CALCIUM 9.7  --  9.9  --  9.3 9.1 8.9 9.5 9.5  --   PHOS  --   --  8.8*  --  8.2* 7.8* 6.7* 6.9* 6.6* 6.4*   < > = values in this interval not displayed.   CBC Recent Labs  Lab 01/05/20 2004 01/05/20 2030 01/08/20 0559 01/08/20 0820 01/09/20 0554 01/09/20 1430  WBC 9.3   < > 7.7 10.7* 7.3 7.3  NEUTROABS 7.9*  --   --   --   --   --   HGB 9.4*   < > 6.7* 8.2* 7.2* 7.5*  HCT 30.9*   < > 21.3* 25.7* 22.8* 23.7*  MCV 93.1   < > 88.8 89.5 89.4 89.1  PLT 89*   < > 85* 103* 83* 67*   < > = values in this interval not displayed.    Medications:    . chlorhexidine gluconate (MEDLINE KIT)  15 mL Mouth Rinse BID  . Chlorhexidine Gluconate Cloth  6 each Topical Daily  . Chlorhexidine Gluconate Cloth  6 each Topical Q0600  . feeding supplement (PRO-STAT SUGAR FREE 64)  60 mL Per Tube BID  . fentaNYL      . heparin      . insulin aspart  0-15 Units Subcutaneous Q4H  . lidocaine      . mouth rinse  15 mL Mouth Rinse 10 times per day  . midazolam      . midodrine  10 mg Per Tube TID  . mupirocin ointment  1 application Nasal BID  . pantoprazole sodium  40 mg Per Tube Daily  . sodium bicarbonate  650 mg Per Tube BID  . sodium chloride flush  10-40 mL Intracatheter Q12H      Otelia Santee, MD 01/10/2020, 11:02 AM

## 2020-01-10 NOTE — Procedures (Signed)
Pre-procedure Diagnosis: ESRD Post-procedure Diagnosis: Same  Successful placement of tunneled HD catheter with tips terminating within the superior aspect of the right atrium.    Complications: None Immediate  EBL: Minimal   The catheter is ready for immediate use.   Jay Elaynah [place], MD Pager #: [phone]   

## 2020-01-10 NOTE — Progress Notes (Signed)
[facility] Progress Note Patient Name: Bailey Cox DOB: 02-12-1946 MRN: [id number]   Date of Service  01/10/2020  HPI/Events of Note  Patient remains hypotensive despite fluid challenges given. MAP <65  eICU Interventions  Ordered to start neosynephrine and will avoid further fluid challenges. Patient is planned for dialysis cath revision in am     Intervention Category Major Interventions: Hypotension - evaluation and management  Judd Lien 01/10/2020, 1:34 AM

## 2020-01-10 NOTE — H&P (Signed)
Chief Complaint: Patient was seen in consultation today for PEA arrest  Referring Physician(s): Dr. Ruthann Cancer  Supervising Physician: Sandi Mariscal  Patient Status: [facility] - In-pt  History of Present Illness: Bailey Cox is a 74 y.o. female admitted 3/1 from [place] where she had just arrived after long admission to [facility] where she suffered PEA arrest during endoscopy and ended up requiring tracheostomy as well as intermittent dialysis. Presents to [facility] ED with widespread peripheral edema and acidosis. A R IJ temporary dialysis catheter was placed on the unit yesterday, however was not able to be used. Patient's right arm has become stiff with decreased ROM.  IR asked to place tunneled HD catheter for anticipated long-term use.   Patient assessed at bedside.  She is intubated via trach. R IJ temp cath in place.  Right arm difficult to maneuver.  Requiring increased pressors.  Not on sedation.   Past Medical History:  Diagnosis Date  . Chronic respiratory failure ([place])   . CKD (chronic kidney disease)   . DM (diabetes mellitus) ([place])   . GERD (gastroesophageal reflux disease)   . HTN (hypertension)   . Ogilvie syndrome   . 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 Vascular dementia [facility])     History reviewed. No pertinent surgical history.  Allergies: Drug class [clindamycin/lincomycin], Drug ingredient [cephalexin], Nsaids, Penicillins, Sulfa antibiotics, and Dilaudid [hydromorphone hcl]  Medications: Prior to Admission medications   Medication Sig Start Date End Date Taking? Authorizing Provider  acetylcysteine (MUCOMYST) 20 % nebulizer solution Take 2 mLs by nebulization every 6 (six) hours.   Yes [provider]  albuterol (PROVENTIL) (2.5 MG/3ML) 0.083% nebulizer solution Take 2.5 mg by nebulization every 6 (six) hours.   Yes [provider]  ALPRAZolam (XANAX) 0.25 MG tablet Take 0.25 mg by mouth 3 (three) times daily as needed for anxiety.   Yes  [provider]  alum & mag hydroxide-simeth (MAALOX PLUS) 400-400-40 MG/5ML suspension Take 30 mLs by mouth every 6 (six) hours as needed for indigestion.   Yes [provider]  ceFEPime (MAXIPIME) IVPB Inject 1 g into the vein every 12 (twelve) hours.   Yes [provider]  esomeprazole (NEXIUM) 40 MG packet Take 40 mg by mouth daily.   Yes [provider]  insulin glargine (LANTUS) 100 UNIT/ML injection Inject 5 Units into the skin at bedtime.   Yes [provider]  insulin lispro (HUMALOG) 100 UNIT/ML injection Inject 0-14 Units into the skin every 6 (six) hours.   Yes [provider]  lidocaine (LIDODERM) 5 % Place 1 patch onto the skin daily. Remove & Discard patch within 12 hours or as directed by MD   Yes [provider]  metoprolol tartrate (LOPRESSOR) 25 MG tablet Take 12.5 mg by mouth 2 (two) times daily.   Yes [provider]  Multiple Vitamins-Minerals (CENTAMIN) LIQD Take 15 mLs by mouth daily.   Yes [provider]  nystatin (MYCOSTATIN) 100000 UNIT/ML suspension Take 5 mLs by mouth 4 (four) times daily.   Yes [provider]  ondansetron (ZOFRAN) 4 MG/2ML SOLN injection Inject 4 mg into the vein every 8 (eight) hours as needed for nausea or vomiting.   Yes [provider]  PARoxetine (PAXIL) 30 MG tablet Take 60 mg by mouth daily.    Yes [provider]  polyethylene glycol (MIRALAX / GLYCOLAX) 17 g packet Take 17 g by mouth 2 (two) times daily as needed for moderate constipation.  Yes [provider]  senna (SENOKOT) 8.6 MG TABS tablet Take 1 tablet by mouth daily as needed for mild constipation.   Yes [provider]  traMADol (ULTRAM) 50 MG tablet Take 50 mg by mouth every 6 (six) hours as needed for moderate pain (up to 5 days).   Yes [provider]  vancomycin IVPB Inject into the vein every other day. 1500 mg, overfill 40 mL in Sodium chloride  0.9% 500 mL IVPB; started 01/06/2020   Yes [provider]     History reviewed. No pertinent family history.  Social History   Socioeconomic History  . Marital status: Single    Spouse name: Not on file  . Number of children: Not on file  . Years of education: Not on file  . Highest education level: Not on file  Occupational History  . Not on file  Tobacco Use  . Smoking status: Not on file  Substance and Sexual Activity  . Alcohol use: Not on file  . Drug use: Not on file  . Sexual activity: Not on file  Other Topics Concern  . Not on file  Social History Narrative  . Not on file   Social Determinants of Health   Financial Resource Strain:   . Difficulty of Paying Living Expenses: Not on file  Food Insecurity:   . Worried About Charity fundraiser in the Last Year: Not on file  . Ran Out of Food in the Last Year: Not on file  Transportation Needs:   . Lack of Transportation (Medical): Not on file  . Lack of Transportation (Non-Medical): Not on file  Physical Activity:   . Days of Exercise per Week: Not on file  . Minutes of Exercise per Session: Not on file  Stress:   . Feeling of Stress : Not on file  Social Connections:   . Frequency of Communication with Friends and Family: Not on file  . Frequency of Social Gatherings with Friends and Family: Not on file  . Attends Religious Services: Not on file  . Active Member of Clubs or Organizations: Not on file  . Attends Archivist Meetings: Not on file  . Marital Status: Not on file     Review of Systems: A 12 point ROS discussed and pertinent positives are indicated in the HPI above.  All other systems are negative.  Review of Systems  Unable to perform ROS: Intubated    Vital Signs: BP (!) 104/44 (BP Location: Left Arm)   Pulse 96   Temp 98.6 F (37 C)   Resp 18   Ht 5\' 4"  (1.626 m)   Wt 246 lb 4.1 oz (111.7 kg)   SpO2 97%   BMI 42.27 kg/m   Physical Exam Vitals and nursing note  reviewed.  Neck:     Comments: R temp line in place Cardiovascular:     Rate and Rhythm: Normal rate and regular rhythm.  Pulmonary:     Comments: intubated Musculoskeletal:     Cervical back: Neck supple.     Right lower leg: Edema present.     Left lower leg: Edema present.  Neurological:     Mental Status: She is alert.     Comments: Not on sedation      MD Evaluation Airway: Other (comments) Airway comments: trach Heart: WNL Abdomen: WNL Chest/ Lungs: WNL ASA  Classification: 3 Mallampati/Airway Score: Three   Imaging: DG CHEST PORT 1 VIEW  Result Date: 01/09/2020 CLINICAL  DATA:  Central line placement EXAM: PORTABLE CHEST 1 VIEW COMPARISON:  01/09/2020 FINDINGS: Right internal jugular Vas-Cath in place with the tip in the SVC. No pneumothorax. Tracheostomy and right PICC line remain in place, unchanged. Layering bilateral effusions with bilateral airspace disease again noted, unchanged. IMPRESSION: Interval placement of right internal jugular Vas-Cath with the tip in the SVC. No pneumothorax. Otherwise no change. Electronically Signed   By: Rolm Baptise M.D.   On: 01/09/2020 10:03   DG Chest Port 1 View  Result Date: 01/09/2020 CLINICAL DATA:  Acute respiratory failure. EXAM: PORTABLE CHEST 1 VIEW COMPARISON:  One-view chest x-ray 01/05/2020 FINDINGS: Tracheostomy tube is stable in position. Right-sided PICC line is in place. Heart size is normal. Diffuse interstitial and airspace disease is stable. Bilateral effusions are again noted. IMPRESSION: 1. Stable appearance of diffuse interstitial and airspace disease and bilateral effusions. This likely represents edema. Infection is not excluded. 2. Support apparatus is stable. Electronically Signed   By: San Morelle M.D.   On: 01/09/2020 08:32   DG Chest Portable 1 View  Result Date: 01/05/2020 CLINICAL DATA:  Acidosis, generalized edema EXAM: PORTABLE CHEST 1 VIEW COMPARISON:  None. FINDINGS: Tracheostomy device is  present.  Right PICC line tip overlies SVC. Low lung volumes. Small bilateral pleural effusions and bibasilar atelectasis. Central pulmonary vascular congestion. Cardiomediastinal silhouette is likely within normal limits for portable technique. IMPRESSION: Tracheostomy device and right PICC line. Small bilateral pleural effusions and bibasilar atelectasis. Central pulmonary vascular congestion Electronically Signed   By: Macy Mis M.D.   On: 01/05/2020 21:09   ECHOCARDIOGRAM COMPLETE  Result Date: 01/06/2020    ECHOCARDIOGRAM REPORT   Patient Name:   TERRIAH REGGIO Date of Exam: 01/06/2020 Medical Rec #:  [id number]        Height:       64.0 in Accession #:    [id number]       Weight:       249.1 lb Date of Birth:  02-01-1946        BSA:          2.148 m Patient Age:    10 years         BP:           103/38 mmHg Patient Gender: F                HR:           80 bpm. Exam Location:  Inpatient Procedure: 2D Echo, Cardiac Doppler, Color Doppler and Intracardiac            Opacification Agent Indications:    Dyspnea 786.09 / R06.00                 Abnormal ECG 794.31 / R94.31  History:        Patient has no prior history of Echocardiogram examinations.                 Shock. Renal failure. Chronic hypoxemic respiratory failure.  Sonographer:    Clayton Lefort RDCS (AE) Referring Phys: [id number] Silvestre Moment [facility]  Sonographer Comments: Technically difficult study due to poor echo windows, suboptimal parasternal window, suboptimal subcostal window and patient is morbidly obese. Patient has tracheostomy tube; tube and bandange in subcoastal area. IMPRESSIONS  1. Left ventricular ejection fraction, by estimation, is 65 to 70%. The left ventricle has normal function. The left ventricle has no regional wall motion abnormalities. There is moderate concentric left ventricular hypertrophy. Left ventricular  diastolic parameters are consistent with Grade II diastolic dysfunction (pseudonormalization). Elevated left atrial pressure.   2. Right ventricular systolic function is normal. The right ventricular size is severely enlarged. There is normal pulmonary artery systolic pressure. The estimated right ventricular systolic pressure is 51.0 mmHg.  3. Left atrial size was mildly dilated.  4. The mitral valve is normal in structure and function. No evidence of mitral valve regurgitation. No evidence of mitral stenosis.  5. The aortic valve is normal in structure and function. Aortic valve regurgitation is not visualized. No aortic stenosis is present.  6. The inferior vena cava is dilated in size with <50% respiratory variability, suggesting right atrial pressure of 15 mmHg. FINDINGS  Left Ventricle: Left ventricular ejection fraction, by estimation, is 65 to 70%. The left ventricle has normal function. The left ventricle has no regional wall motion abnormalities. Definity contrast agent was given IV to delineate the left ventricular  endocardial borders. The left ventricular internal cavity size was normal in size. There is moderate concentric left ventricular hypertrophy. Left ventricular diastolic parameters are consistent with Grade II diastolic dysfunction (pseudonormalization).  Elevated left atrial pressure. Right Ventricle: The right ventricular size is severely enlarged. Right vetricular wall thickness was not assessed. Right ventricular systolic function is normal. There is normal pulmonary artery systolic pressure. The tricuspid regurgitant velocity is 1.69 m/s, and with an assumed right atrial pressure of 15 mmHg, the estimated right ventricular systolic pressure is 25.8 mmHg. Left Atrium: Left atrial size was mildly dilated. Right Atrium: Right atrial size was normal in size. Pericardium: There is no evidence of pericardial effusion. Mitral Valve: The mitral valve is normal in structure and function. Normal mobility of the mitral valve leaflets. No evidence of mitral valve regurgitation. No evidence of mitral valve stenosis. MV peak  gradient, 14.0 mmHg. The mean mitral valve gradient  is 4.0 mmHg. Tricuspid Valve: The tricuspid valve is normal in structure. Tricuspid valve regurgitation is trivial. No evidence of tricuspid stenosis. Aortic Valve: The aortic valve is normal in structure and function. Aortic valve regurgitation is not visualized. No aortic stenosis is present. Aortic valve mean gradient measures 9.7 mmHg. Aortic valve peak gradient measures 16.5 mmHg. Aortic valve area, by VTI measures 2.12 cm. Pulmonic Valve: The pulmonic valve was normal in structure. Pulmonic valve regurgitation is not visualized. No evidence of pulmonic stenosis. Aorta: The aortic root is normal in size and structure. Venous: IVC assessment for right atrial pressure unable to be performed due to mechanical ventilation. The inferior vena cava is dilated in size with less than 50% respiratory variability, suggesting right atrial pressure of 15 mmHg. IAS/Shunts: No atrial level shunt detected by color flow Doppler.  LEFT VENTRICLE PLAX 2D LVIDd:         3.80 cm  Diastology LVIDs:         2.60 cm  LV e' lateral:   5.03 cm/s LV PW:         1.40 cm  LV E/e' lateral: 27.4 LV IVS:        1.40 cm  LV e' medial:    4.14 cm/s LVOT diam:     1.80 cm  LV E/e' medial:  33.3 LV SV:         91 LV SV Index:   42 LVOT Area:     2.54 cm  RIGHT VENTRICLE             IVC RV Basal diam:  5.20 cm     IVC  diam: 2.10 cm RV S prime:     11.90 cm/s TAPSE (M-mode): 2.5 cm LEFT ATRIUM             Index       RIGHT ATRIUM           Index LA diam:        3.80 cm 1.77 cm/m  RA Area:     17.20 cm LA Vol (A2C):   88.6 ml 41.25 ml/m RA Volume:   51.10 ml  23.79 ml/m LA Vol (A4C):   77.2 ml 35.94 ml/m LA Biplane Vol: 85.9 ml 40.00 ml/m  AORTIC VALVE AV Area (Vmax):    2.22 cm AV Area (Vmean):   2.17 cm AV Area (VTI):     2.12 cm AV Vmax:           203.00 cm/s AV Vmean:          145.667 cm/s AV VTI:            0.428 m AV Peak Grad:      16.5 mmHg AV Mean Grad:      9.7 mmHg LVOT Vmax:          177.00 cm/s LVOT Vmean:        124.000 cm/s LVOT VTI:          0.357 m LVOT/AV VTI ratio: 0.83  AORTA Ao Root diam: 2.90 cm Ao Asc diam:  3.20 cm MITRAL VALVE                TRICUSPID VALVE MV Area (PHT): 2.91 cm     TR Peak grad:   11.4 mmHg MV Peak grad:  14.0 mmHg    TR Vmax:        169.00 cm/s MV Mean grad:  4.0 mmHg MV Vmax:       1.87 m/s     SHUNTS MV Vmean:      83.5 cm/s    Systemic VTI:  0.36 m MV Decel Time: 261 msec     Systemic Diam: 1.80 cm MV E velocity: 138.00 cm/s MV A velocity: 97.40 cm/s MV E/A ratio:  1.42 Mihai Croitoru MD Electronically signed by Sanda Klein MD Signature Date/Time: 01/06/2020/1:49:05 PM    Final     Labs:  CBC: Recent Labs    01/08/20 0559 01/08/20 0820 01/09/20 0554 01/09/20 1430  WBC 7.7 10.7* 7.3 7.3  HGB 6.7* 8.2* 7.2* 7.5*  HCT 21.3* 25.7* 22.8* 23.7*  PLT 85* 103* 83* 67*    COAGS: Recent Labs    01/05/20 2004  INR 1.1  APTT 34    BMP: Recent Labs    01/07/20 0342 01/08/20 0559 01/09/20 0554 01/09/20 1430  NA 134* 136 139 137  K 4.1 4.4 4.6 4.4  CL 96* 98 98 97*  CO2 20* 22 22 23   GLUCOSE 114* 179* 101* 114*  BUN 132* 140* 152* 138*  CALCIUM 9.1 8.9 9.5 9.5  CREATININE 2.61* 2.85* 3.08* 2.99*  GFRNONAA 18* 16* 15* 15*  GFRAA 21* 19* 17* 17*    LIVER FUNCTION TESTS: Recent Labs    01/05/20 2004 01/09/20 1430  BILITOT QUANTITY NOT SUFFICIENT, UNABLE TO PERFORM TEST  --   AST 25  --   ALT 22  --   ALKPHOS 177*  --   PROT 5.8*  --   ALBUMIN 2.4* 2.0*    TUMOR MARKERS: No results for input(s): AFPTM, CEA, CA199, CHROMGRNA in the last 8760 hours.  Assessment  and Plan: Renal failure, volume overload Patient s/p temporary dialysis catheter placement yesterday at bedside, however found later to be non-functioning. IR asked to replace with tunneled dialysis catheter in anticipation of long-term dialysis.  Afebrile. WBC 7.3  Note increased pressor requirement.   Family contacted for consent.  Risks and  benefits discussedincluding, but not limited to bleeding, infection, vascular injury, pneumothorax which may require chest tube placement, air embolism or even death  All questions were answered, family is agreeable to proceed. Consent signed and in chart.  Thank you for this interesting consult.  I greatly enjoyed meeting Atasha Colebank and look forward to participating in their care.  A copy of this report was sent to the requesting provider on this date.  Electronically Signed: Docia Barrier, PA 01/10/2020, 9:06 AM   I spent a total of 20 Minutes    in face to face in clinical consultation, greater than 50% of which was counseling/coordinating care for fluid overload, renal failure

## 2020-01-10 NOTE — Progress Notes (Signed)
VASCULAR LAB PRELIMINARY  PRELIMINARY  PRELIMINARY  PRELIMINARY  Bilateral upper and lower extremity venous duplex completed.    Preliminary report:  See CV proc for preliminary results.   Mitzi Lilja, RVT 01/10/2020, 8:21 PM

## 2020-01-10 NOTE — Progress Notes (Signed)
RT NOTE: RT transported patient with RN from 4N17 to IR and back. No complications. RT will continue to monitor.

## 2020-01-11 DIAGNOSIS — J9601 Acute respiratory failure with hypoxia: Secondary | ICD-10-CM | POA: Diagnosis not present

## 2020-01-11 LAB — HEPARIN INDUCED PLATELET AB (HIT ANTIBODY): Heparin Induced Plt Ab: 0.167 OD (ref 0.000–0.400)

## 2020-01-11 LAB — CBC
HCT: 21.9 % — ABNORMAL LOW (ref 36.0–46.0)
Hemoglobin: 6.7 g/dL — CL (ref 12.0–15.0)
MCH: 28 pg (ref 26.0–34.0)
MCHC: 30.6 g/dL (ref 30.0–36.0)
MCV: 91.6 fL (ref 80.0–100.0)
Platelets: 61 10*3/uL — ABNORMAL LOW (ref 150–400)
RBC: 2.39 MIL/uL — ABNORMAL LOW (ref 3.87–5.11)
RDW: 18.8 % — ABNORMAL HIGH (ref 11.5–15.5)
WBC: 7.2 10*3/uL (ref 4.0–10.5)
nRBC: 0 % (ref 0.0–0.2)

## 2020-01-11 LAB — RENAL FUNCTION PANEL
Albumin: 2 g/dL — ABNORMAL LOW (ref 3.5–5.0)
Anion gap: 14 (ref 5–15)
BUN: 137 mg/dL — ABNORMAL HIGH (ref 8–23)
CO2: 24 mmol/L (ref 22–32)
Calcium: 9 mg/dL (ref 8.9–10.3)
Chloride: 104 mmol/L (ref 98–111)
Creatinine, Ser: 2.95 mg/dL — ABNORMAL HIGH (ref 0.44–1.00)
GFR calc Af Amer: 18 mL/min — ABNORMAL LOW (ref >60)
GFR calc non Af Amer: 15 mL/min — ABNORMAL LOW (ref >60)
Glucose, Bld: 170 mg/dL — ABNORMAL HIGH (ref 70–99)
Phosphorus: 6.2 mg/dL — ABNORMAL HIGH (ref 2.5–4.6)
Potassium: 4.4 mmol/L (ref 3.5–5.1)
Sodium: 142 mmol/L (ref 135–145)

## 2020-01-11 LAB — GLUCOSE, CAPILLARY
Glucose-Capillary: 114 mg/dL — ABNORMAL HIGH (ref 70–99)
Glucose-Capillary: 160 mg/dL — ABNORMAL HIGH (ref 70–99)
Glucose-Capillary: 185 mg/dL — ABNORMAL HIGH (ref 70–99)
Glucose-Capillary: 166 mg/dL — ABNORMAL HIGH (ref 70–99)
Glucose-Capillary: 139 mg/dL — ABNORMAL HIGH (ref 70–99)
Glucose-Capillary: 149 mg/dL — ABNORMAL HIGH (ref 70–99)

## 2020-01-11 LAB — BASIC METABOLIC PANEL
Anion gap: 17 — ABNORMAL HIGH (ref 5–15)
BUN: 148 mg/dL — ABNORMAL HIGH (ref 8–23)
CO2: 24 mmol/L (ref 22–32)
Calcium: 9.2 mg/dL (ref 8.9–10.3)
Chloride: 101 mmol/L (ref 98–111)
Creatinine, Ser: 3.21 mg/dL — ABNORMAL HIGH (ref 0.44–1.00)
GFR calc Af Amer: 16 mL/min — ABNORMAL LOW (ref >60)
GFR calc non Af Amer: 14 mL/min — ABNORMAL LOW (ref >60)
Glucose, Bld: 171 mg/dL — ABNORMAL HIGH (ref 70–99)
Potassium: 4.6 mmol/L (ref 3.5–5.1)
Sodium: 142 mmol/L (ref 135–145)

## 2020-01-11 LAB — HEMOGLOBIN AND HEMATOCRIT, BLOOD
HCT: 27 % — ABNORMAL LOW (ref 36.0–46.0)
Hemoglobin: 8.6 g/dL — ABNORMAL LOW (ref 12.0–15.0)

## 2020-01-11 LAB — PHOSPHORUS: Phosphorus: 6.9 mg/dL — ABNORMAL HIGH (ref 2.5–4.6)

## 2020-01-11 LAB — MAGNESIUM: Magnesium: 2.2 mg/dL (ref 1.7–2.4)

## 2020-01-11 LAB — PREPARE RBC (CROSSMATCH)

## 2020-01-11 LAB — ABO/RH: ABO/RH(D): O NEG

## 2020-01-11 MED ORDER — PRISMASOL BGK 4/2.5 32-4-2.5 MEQ/L REPLACEMENT SOLN
Status: DC
  Filled 2020-01-11 (×17): qty 5000

## 2020-01-11 MED ORDER — SODIUM CHLORIDE 0.9% IV SOLUTION: Freq: Once | INTRAVENOUS | Status: AC

## 2020-01-11 MED ORDER — PRISMASOL BGK 4/2.5 32-4-2.5 MEQ/L REPLACEMENT SOLN
Status: DC
  Filled 2020-01-11 (×9): qty 5000

## 2020-01-11 MED ORDER — MIDODRINE HCL 5 MG PO TABS
5.0000 mg | ORAL_TABLET | Freq: Three times a day (TID) | ORAL | Status: DC
  Administered 2020-01-11: 5 mg
  Filled 2020-01-11: qty 1

## 2020-01-11 MED ORDER — PRISMASOL BGK 4/2.5 32-4-2.5 MEQ/L IV SOLN
INTRAVENOUS | Status: DC
  Filled 2020-01-11 (×56): qty 5000

## 2020-01-11 MED ORDER — PANTOPRAZOLE SODIUM 40 MG IV SOLR
40.0000 mg | Freq: Two times a day (BID) | INTRAVENOUS | Status: DC
  Administered 2020-01-11 – 2020-01-12 (×4): 40 mg via INTRAVENOUS
  Filled 2020-01-11 (×4): qty 40

## 2020-01-11 MED ORDER — SODIUM CHLORIDE 0.9 % FOR CRRT
INTRAVENOUS_CENTRAL | Status: DC | PRN
  Filled 2020-01-11 (×2): qty 1000

## 2020-01-11 NOTE — Progress Notes (Signed)
Labs back and with decrease in hgb again.  Transfuse 1u prbc with h&h after.   Daughter states pt had h/o "stomach bleeding" had endoscopy but coded during procedure and they were not "able to fix everything". Was told it was "slow".   In light of this information will change protonix to BID IV from PO daily

## 2020-01-11 NOTE — Progress Notes (Signed)
[facility] Progress Note    74 y.o.femalewho had a prolonged 81 day hospitalization at [facility] with a history of HTN COPD DM hypothyroidism Ogilvie's syndrome who was transferred to [place] on 12/02/2019. PEA arrest during EGD in early December with duodenal ulcer noted on EGD.Intermittent hyperkalemia treatet medically w/ Kayexalate and now recently w/ worsening renal function over the past few days prior to admission to [facility]. She actually had a foley placed 12/28/19 when the renal function was worsening with return of clear purulence and then started on Meropenem. Per review of chart the pt did not want RRT but after urging by family agreed to it. She was also started on Cefepime and Vanco for tracheitis in Feb.  Assessment/ Plan:   1. AKI on CKD with most likely cause of the AKI being ATN from sepsis.  - She is not a good candidate for dialysis.  - [name] [phone] and [name] [phone]; both have POA.  I spoke with Katharine Look 3/4 and they've never refused RRT; actually was on dialysis for a mth at [facility].  Attempted iHD 3/5 but short treatment as cath was not working -> cath has since been converted by VIR to TC (Much appreciated)   Will have to start CRRT for clearance and also for the anasarca/ volume management; will be very difficult to effect the +Na balance with iHD. Can challenge with iHD if needed later in the week.  2. Sepsis - on pressors and broad spectrum abx(cefepime). Reculturing. 3. ?urosepsis - will recheck urinalysis microscopy and culture, cefepime vanc on 2/25 to 3/2 -> stopped the vanco w/ worsening renal function. 4. H/o PEA arrest 5. MRSA PNA on vanc/cefepime -> cefepime 6. Respiratory failure on trach 7. HFpEF  Subjective:   Off pressors.    Objective:   BP (!) 179/81   Pulse 96   Temp 98.4 F (36.9 C)   Resp (!) 24   Ht '5\' 4"'$  (1.626 m)   Wt 111.7 kg   SpO2 97%   BMI  42.27 kg/m   Intake/Output Summary (Last 24 hours) at 01/11/2020 1323 Last data filed at 01/11/2020 1300 Gross per 24 hour  Intake 1147.63 ml  Output 1800 ml  Net -652.37 ml   Weight change:   Physical Exam: GEN: NAD,trached HEENT: No conjunctival pallor, EOMI NECK: Supple, no thyromegaly LUNGS: CTA B/L  CV: RRR, No M/R/G ABD: SNDNT +BS  EXT:2-3+ lowerextremity edema diffusely ACCESS: RIJ TC  Imaging: IR Fluoro Guide CV Line Right  Result Date: 01/10/2020 INDICATION: Acute renal insufficiency. Please perform tunnel dialysis catheter placement for the initiation of dialysis Note, patient underwent attempted bedside non tunneled jugular approach dialysis catheter placement, however the catheter has not functioned well. EXAM: TUNNELED CENTRAL VENOUS HEMODIALYSIS CATHETER PLACEMENT WITH ULTRASOUND AND FLUOROSCOPIC GUIDANCE MEDICATIONS: Patient admitted to the hospital receiving intravenous antibiotics. The antibiotic was given in an appropriate time interval prior to skin puncture. ANESTHESIA/SEDATION: Versed 0.5 mg IV; Fentanyl 25 mcg IV; Moderate Sedation Time:  10 minutes The patient was continuously monitored during the procedure by the interventional radiology nurse under my direct supervision. FLUOROSCOPY TIME:  24 seconds (0.9 mGy) COMPLICATIONS: None immediate. PROCEDURE: Informed written consent was obtained from the the patient's daughter after a discussion of the risks, benefits, and alternatives to treatment. Questions regarding the procedure were encouraged and answered. The existing non tunneled dialysis catheter was removed and hemostasis was achieved with manual compression. The right neck and chest  were prepped with chlorhexidine in a sterile fashion, and a sterile drape was applied covering the operative field. Maximum barrier sterile technique with sterile gowns and gloves were used for the procedure. A timeout was performed prior to the initiation of the procedure. After  creating a small venotomy incision, a micropuncture kit was utilized to access the internal jugular vein. Real-time ultrasound guidance was utilized for vascular access including the acquisition of a permanent ultrasound image documenting patency of the accessed vessel. The microwire was utilized to measure appropriate catheter length. A stiff Glidewire was advanced to the level of the IVC and the micropuncture sheath was exchanged for a peel-away sheath. A palindrome tunneled hemodialysis catheter measuring 23 cm from tip to cuff was tunneled in a retrograde fashion from the anterior chest wall to the venotomy incision. The catheter was then placed through the peel-away sheath with tips ultimately positioned within the superior aspect of the right atrium. Final catheter positioning was confirmed and documented with a spot radiographic image. The catheter aspirates and flushes normally. The catheter was flushed with appropriate volume heparin dwells. The catheter exit site was secured with a 0-Prolene retention suture. The venotomy incision was closed with an interrupted 4-0 Vicryl, Dermabond and Steri-strips. Dressings were applied. The patient tolerated the procedure well without immediate post procedural complication. IMPRESSION: 1. Successful placement of 23 cm tip to cuff tunneled hemodialysis catheter via the right internal jugular vein with tips terminating within the superior aspect of the right atrium. The catheter is ready for immediate use. 2. Successful bedside removal of nonfunctional non tunneled dialysis catheter. Electronically Signed   By: Sandi Mariscal M.D.   On: 01/10/2020 11:14   IR US Guide Vasc Access Right  Result Date: 01/10/2020 INDICATION: Acute renal insufficiency. Please perform tunnel dialysis catheter placement for the initiation of dialysis Note, patient underwent attempted bedside non tunneled jugular approach dialysis catheter placement, however the catheter has not functioned well.  EXAM: TUNNELED CENTRAL VENOUS HEMODIALYSIS CATHETER PLACEMENT WITH ULTRASOUND AND FLUOROSCOPIC GUIDANCE MEDICATIONS: Patient admitted to the hospital receiving intravenous antibiotics. The antibiotic was given in an appropriate time interval prior to skin puncture. ANESTHESIA/SEDATION: Versed 0.5 mg IV; Fentanyl 25 mcg IV; Moderate Sedation Time:  10 minutes The patient was continuously monitored during the procedure by the interventional radiology nurse under my direct supervision. FLUOROSCOPY TIME:  24 seconds (0.9 mGy) COMPLICATIONS: None immediate. PROCEDURE: Informed written consent was obtained from the the patient's daughter after a discussion of the risks, benefits, and alternatives to treatment. Questions regarding the procedure were encouraged and answered. The existing non tunneled dialysis catheter was removed and hemostasis was achieved with manual compression. The right neck and chest were prepped with chlorhexidine in a sterile fashion, and a sterile drape was applied covering the operative field. Maximum barrier sterile technique with sterile gowns and gloves were used for the procedure. A timeout was performed prior to the initiation of the procedure. After creating a small venotomy incision, a micropuncture kit was utilized to access the internal jugular vein. Real-time ultrasound guidance was utilized for vascular access including the acquisition of a permanent ultrasound image documenting patency of the accessed vessel. The microwire was utilized to measure appropriate catheter length. A stiff Glidewire was advanced to the level of the IVC and the micropuncture sheath was exchanged for a peel-away sheath. A palindrome tunneled hemodialysis catheter measuring 23 cm from tip to cuff was tunneled in a retrograde fashion from the anterior chest wall to the venotomy incision. The  catheter was then placed through the peel-away sheath with tips ultimately positioned within the superior aspect of the  right atrium. Final catheter positioning was confirmed and documented with a spot radiographic image. The catheter aspirates and flushes normally. The catheter was flushed with appropriate volume heparin dwells. The catheter exit site was secured with a 0-Prolene retention suture. The venotomy incision was closed with an interrupted 4-0 Vicryl, Dermabond and Steri-strips. Dressings were applied. The patient tolerated the procedure well without immediate post procedural complication. IMPRESSION: 1. Successful placement of 23 cm tip to cuff tunneled hemodialysis catheter via the right internal jugular vein with tips terminating within the superior aspect of the right atrium. The catheter is ready for immediate use. 2. Successful bedside removal of nonfunctional non tunneled dialysis catheter. Electronically Signed   By: Sandi Mariscal M.D.   On: 01/10/2020 11:14   VAS [place] LOWER EXTREMITY VENOUS (DVT)  Result Date: 01/11/2020  Lower Venous DVTStudy Indications: Edema.  Limitations: Body habitus, ventilation, and Significant pitting edema. Comparison Study: No prior study on file for comparison. Performing Technologist: Sharion Dove RVS  Examination Guidelines: A complete evaluation includes B-mode imaging, spectral Doppler, color Doppler, and power Doppler as needed of all accessible portions of each vessel. Bilateral testing is considered an integral part of a complete examination. Limited examinations for reoccurring indications may be performed as noted. The reflux portion of the exam is performed with the patient in reverse Trendelenburg.  +---------+---------------+---------+-----------+----------+-------------------+ RIGHT    CompressibilityPhasicitySpontaneityPropertiesThrombus Aging      +---------+---------------+---------+-----------+----------+-------------------+ CFV      Full           Yes      Yes                                       +---------+---------------+---------+-----------+----------+-------------------+ SFJ      Full                                                             +---------+---------------+---------+-----------+----------+-------------------+ FV Prox                                               patent by color and                                                       Doppler             +---------+---------------+---------+-----------+----------+-------------------+ FV Mid                                                Not visualized      +---------+---------------+---------+-----------+----------+-------------------+ FV Distal  Not visualized      +---------+---------------+---------+-----------+----------+-------------------+ PFV                                                   Not visualized      +---------+---------------+---------+-----------+----------+-------------------+ POP      Full                                         pulsatile           +---------+---------------+---------+-----------+----------+-------------------+ PTV      Full                                                             +---------+---------------+---------+-----------+----------+-------------------+ PERO     Full                                                             +---------+---------------+---------+-----------+----------+-------------------+   +---------+---------------+---------+-----------+----------+-------------------+ LEFT     CompressibilityPhasicitySpontaneityPropertiesThrombus Aging      +---------+---------------+---------+-----------+----------+-------------------+ CFV      Full                                         pulsatile flow      +---------+---------------+---------+-----------+----------+-------------------+ SFJ      Full                                                              +---------+---------------+---------+-----------+----------+-------------------+ FV Prox  Full                                                             +---------+---------------+---------+-----------+----------+-------------------+ FV Mid                                                Not visualized      +---------+---------------+---------+-----------+----------+-------------------+ FV Distal                                             Not visualized      +---------+---------------+---------+-----------+----------+-------------------+ PFV  patent by color and                                                       Doppler             +---------+---------------+---------+-----------+----------+-------------------+ PTV      Full                                                             +---------+---------------+---------+-----------+----------+-------------------+ PERO                                                  Not visualized      +---------+---------------+---------+-----------+----------+-------------------+     Summary: RIGHT: - There is no evidence of deep vein thrombosis in the lower extremity. However, portions of this examination were limited- see technologist comments above.  LEFT: - There is no evidence of deep vein thrombosis in the lower extremity. However, portions of this examination were limited- see technologist comments above.  *See table(s) above for measurements and observations. Electronically signed by Monica Martinez MD on 01/11/2020 at 11:02:35 AM.    Final    VAS [place] UPPER EXTREMITY VENOUS DUPLEX  Result Date: 01/11/2020 UPPER VENOUS STUDY  Indications: Edema Limitations: Body habitus, line and trach collar, contraction of arms, pitting edema. Comparison Study: No prior study on file Performing Technologist: Sharion Dove RVS  Examination Guidelines: A complete evaluation includes  B-mode imaging, spectral Doppler, color Doppler, and power Doppler as needed of all accessible portions of each vessel. Bilateral testing is considered an integral part of a complete examination. Limited examinations for reoccurring indications may be performed as noted.  Right Findings: +----------+------------+---------+-----------+----------+---------------------+ RIGHT     CompressiblePhasicitySpontaneousProperties       Summary        +----------+------------+---------+-----------+----------+---------------------+ IJV           Full       Yes       Yes                                    +----------+------------+---------+-----------+----------+---------------------+ Subclavian               Yes       Yes                                    +----------+------------+---------+-----------+----------+---------------------+ Brachial                 Yes       Yes               PICC line present,  patent by color and                                                             Doppler        +----------+------------+---------+-----------+----------+---------------------+ Radial                                                 Not visualized     +----------+------------+---------+-----------+----------+---------------------+ Ulnar                                                  Not visualized     +----------+------------+---------+-----------+----------+---------------------+ Cephalic      Full                                                        +----------+------------+---------+-----------+----------+---------------------+ Basilic                                                Not visualized     +----------+------------+---------+-----------+----------+---------------------+  Left Findings: +----------+------------+---------+-----------+----------+---------------------+ LEFT       CompressiblePhasicitySpontaneousProperties       Summary        +----------+------------+---------+-----------+----------+---------------------+ IJV                                                    Not visualized     +----------+------------+---------+-----------+----------+---------------------+ Subclavian    Full       Yes       Yes                                    +----------+------------+---------+-----------+----------+---------------------+ Axillary      Full       Yes       Yes                                    +----------+------------+---------+-----------+----------+---------------------+ Brachial      Full       Yes       Yes                                    +----------+------------+---------+-----------+----------+---------------------+ Radial  Not visualized     +----------+------------+---------+-----------+----------+---------------------+ Ulnar                                                  Not visualized     +----------+------------+---------+-----------+----------+---------------------+ Cephalic      Full                                                        +----------+------------+---------+-----------+----------+---------------------+ Basilic                  Yes       Yes               patent by color and                                                             Doppler        +----------+------------+---------+-----------+----------+---------------------+  Summary:  Right: No evidence of deep vein thrombosis in the visualized veins of upper extremity. No evidence of superficial vein thrombosis in the visualized veins of the upper extremity.  Left: No evidence of deep vein thrombosis in the visualized veins of the upper extremity. No evidence of superficial vein thrombosis in the upper extremity.  *See table(s) above for measurements and observations.  Diagnosing  physician: Monica Martinez MD Electronically signed by Monica Martinez MD on 01/11/2020 at 11:02:14 AM.    Final     Labs: BMET Recent Labs  Lab 01/06/20 1830 01/07/20 0712 01/08/20 0559 01/09/20 1975 01/09/20 1430 01/10/20 0417 01/10/20 1140 01/11/20 0610 01/11/20 0810  NA 132* 134* 136 139 137  --  141  --  142  K 4.8 4.1 4.4 4.6 4.4  --  3.9  --  4.6  CL 96* 96* 98 98 97*  --  107  --  101  CO2 17* 20* '22 22 23  '$ --  20*  --  24  GLUCOSE 93 114* 179* 101* 114*  --  136*  --  171*  BUN 133* 132* 140* 152* 138*  --  127*  --  148*  CREATININE 2.60* 2.61* 2.85* 3.08* 2.99*  --  2.73*  --  3.21*  CALCIUM 9.3 9.1 8.9 9.5 9.5  --  8.2*  --  9.2  PHOS 8.2* 7.8* 6.7* 6.9* 6.6* 6.4*  --  6.9*  --    CBC Recent Labs  Lab 01/05/20 2004 01/05/20 2030 01/08/20 0820 01/09/20 0554 01/09/20 1430 01/11/20 0810  WBC 9.3   < > 10.7* 7.3 7.3 7.2  NEUTROABS 7.9*  --   --   --   --   --   HGB 9.4*   < > 8.2* 7.2* 7.5* 6.7*  HCT 30.9*   < > 25.7* 22.8* 23.7* 21.9*  MCV 93.1   < > 89.5 89.4 89.1 91.6  PLT 89*   < > 103* 83* 67* 61*   < > = values in this interval not displayed.    Medications:    .  sodium chloride   Intravenous Once  . chlorhexidine gluconate (MEDLINE KIT)  15 mL Mouth Rinse BID  . Chlorhexidine Gluconate Cloth  6 each Topical Daily  . Chlorhexidine Gluconate Cloth  6 each Topical Q0600  . feeding supplement (PRO-STAT SUGAR FREE 64)  60 mL Per Tube BID  . insulin aspart  0-15 Units Subcutaneous Q4H  . mouth rinse  15 mL Mouth Rinse 10 times per day  . midodrine  5 mg Per Tube TID WC  . pantoprazole (PROTONIX) IV  40 mg Intravenous Q12H  . sodium bicarbonate  650 mg Per Tube BID  . sodium chloride flush  10-40 mL Intracatheter Q12H      Otelia Santee, MD 01/11/2020, 1:23 PM

## 2020-01-11 NOTE — Progress Notes (Signed)
NAME:  Bailey Cox, MRN:  [id number], DOB:  1946-09-13, LOS: 6 ADMISSION DATE:  01/05/2020, CONSULTATION DATE:  01/06/20 REFERRING MD:  Tegeler EDP, CHIEF COMPLAINT:   Shock  Brief History   74 year old female admitted 3/1 from [place] where she had just arrived after long admission to [facility] where she suffered PEA arrest during endoscopy and ended up requiring tracheostomy. Presents to [facility] ED with widespread peripheral edema and acidosis.   History of present illness   74 year old female with PMH as below, which is significant for long admission in the [facility] system from 10/2019 all the way through 01/05/2020.  She was originally hospitalized in September 2020 with acute cholecystitis and underwent a cholecystectomy.  She was then sent to SNF for short duration but had return to [facility] to have a bile duct stent placed ultimately returning to SNF.  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  While hospitalized she developed fluid overload and was ultimately transferred to [facility] for dialysis.  Her course after that point become somewhat unclear, but as described by her family she was on and off the ventilator for several procedures and was eventually left intubated.  One of these procedures was an endoscopy during which she unfortunately suffered a cardiac arrest.  Details of the arrest are unclear.  She remained on the ventilator and ultimately underwent tracheostomy.  Her hospital course was also complicated by MRSA pneumonia.  She was ultimately able to come off of hemodialysis and was treated intermittently with diuresis.  It sounds like she bounced in and out of the ICU a few times with volume overload.  On the tail end of her admission she still struggled with kidney disease and was being treated for a urinary tract infection with cefepime and vancomycin.  Plans were to stop on 3/2.  She was discharged from the [facility] system on 3/1 to  [facility] in [place].  Upon arrival to [facility] they deemed her too sick for admission and transferred her to [facility] emergency department with complaints of volume overload and acidosis.   Past Medical History   has a past medical history of Chronic respiratory failure ([place]), CKD (chronic kidney disease), DM (diabetes mellitus) ([place]), GERD (gastroesophageal reflux disease), HTN (hypertension), Ogilvie syndrome, Tracheostomy status ([place]), and Vascular dementia ([place]).  Significant Hospital Events   07/2019 admit to [facility] for cholecystitis  09/2019 tx to [facility] for renal failure/volume overload. HD started.  10/2019 PEA arrest during EGD . Intubated 11/2019 Trach. HD stopped.   Consults:    Procedures:  PICC pre admit > Trach pre admit > 3/5 IJ vas cath->  Significant Diagnostic Tests:   Echo 10/24/2019 > LVEF 55%, Grade 1 DD. RV systolic function normal.  Echo 3/2: LVEF 65-70%, grade II dysfunction. RVSP  Micro Data:  Blood 3/1 >> ng Urine 3/1 >> ng resp 3/4 >> pseudomonas   Antimicrobials:  Meropenem 2/21 >> off  Cefepime Pre hosp (for tracheitis )  >> 3/1-> 3/7 Vancomycin pre hosp >> 3/2   Interim history/subjective:  3/7: attempted on atc with desat and increased wob. Pt remains poorly responsive. ~1L output during day (none documented during evening). No labs this am will check stat. Off vasopressors however.  3/6: vas cath placed at bedside "not functioning". Have had nephro request IR to re-eval, appears in good position per xray. Indices improving (somewhat) however without lasix and without dialysis, defer to nephro.  Started on neo overnight. tmax 99.1 overnight without wbc elevation. Unclear what would precipitate hypotension.   3/5: no acute events overnight. Per nephro notes, not a good candidate for dialysis but with worsening uremia we are starting today after line placement.   3/4:Afebrile Critically ill, on vent    Objective   Blood pressure  (!) 177/70, pulse 89, temperature 97.7 F (36.5 C), resp. rate (!) 27, height 5\' 4"  (1.626 m), weight 111.7 kg, SpO2 90 %.    Vent Mode: SIMV;PRVC;PSV FiO2 (%):  [40 %-60 %] 40 % Set Rate:  [16 bmp] 16 bmp Vt Set:  [360 mL] 360 mL PEEP:  [5 cmH20] 5 cmH20 Pressure Support:  [20 cmH20] 20 cmH20 Plateau Pressure:  [14 cmH20-24 cmH20] 16 cmH20   Intake/Output Summary (Last 24 hours) at 01/11/2020 0750 Last data filed at 01/11/2020 0600 Gross per 24 hour  Intake 1746.71 ml  Output --  Net 1746.71 ml   Filed Weights   01/09/20 1130 01/09/20 1240 01/10/20 0318  Weight: 116 kg 116.2 kg 111.7 kg    Examination: General: Obese elderly woman  with trach.on vent  HENT: [place]/AT, PERRL, no JVD. Lungs:rhonchi bilaterally Cardiovascular: RRR, no MRG. Marked anasarca Abdomen: obese Soft, non-tender, non-distended. Extremities: 4+ pitting edema. L arm with multiple areas of ecchymosis Neuro: Arousable and opens eyes to verbal stim.  Not following commands, GU: Foley.    Lab Results  Component Value Date   WBC 7.3 01/09/2020   HGB 7.5 (L) 01/09/2020   HCT 23.7 (L) 01/09/2020   MCV 89.1 01/09/2020   PLT 67 (L) 01/09/2020   Lab Results  Component Value Date   CREATININE 2.73 (H) 01/10/2020   BUN 127 (H) 01/10/2020   NA 141 01/10/2020   K 3.9 01/10/2020   CL 107 01/10/2020   CO2 20 (L) 01/10/2020     Assessment & Plan:   Septic shock-UTI was treated with meropenem 2/21 No evidence of HAP on CXR but copious secretions ?  Tracheobronchitis -neo held since last pm.  -actually a bit hypertensive (added midodrine yesterday) -will decreased dose of midodrine prior to giving anti-htn agents.  -Continue cefepime - appears to have responded for total of 7 days will end after today.  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  -no further lasix -Nephrology following, they state "poor  candidate for dialysis" ->1L uop yesterday -labs pending this am.   Acidosis: improved on bicarb tabs  Acute on chronic respiratory failure Tracheostomy status, was on ATC prior to admission - ATC as able. Not able to tolerate again today.  -TracheoBronchial toilet, added saline nebs  DM:  CBG monitoring , up to 180 acceptable and SSI.  Elevated lipase: biliary stent removed in December. - lipase downtrending  Normocytic anemia:  -transfuse <7 -no overt bleeding noted.  -cbc pending todya Thrombocytopenia:  -follow -no acute indication for transfusion -hold heparin today with cont drop.  -will get LE and UE dopplers and send hit ab but hold on any addition of agent at this time.  -suspect 2/2 acute illness.  -labs pending   Best practice:  Diet: NPO, TFs Pain/Anxiety/Delirium protocol (if indicated): NA VAP protocol (if indicated): NA DVT prophylaxis: heparin SQ... held with plts in 60's GI prophylaxis: NA Glucose control: SSI Mobility: BR Code Status: FULL Family Communication: daughter via phone 3/7 Disposition: ICU  Critical care time: The patient  is critically ill with multiple organ systems failure and requires high complexity decision making for assessment and support, frequent evaluation and titration of therapies, application of advanced monitoring technologies and extensive interpretation of multiple databases.  Critical care time 34 mins. This represents my time independent of the NPs time taking care of the pt. This is excluding procedures.    [facility] 01/11/2020, 7:50 AM

## 2020-01-11 NOTE — Progress Notes (Signed)
RT attempted ATC 40% 10L as well as 60% 10L. Patient de-sated to 85% on ATC with increase RR/ work of breathing. RT placed patient back on ventilator and patient is now sating 98% and looks more comfortable. RT will attempt ATC again later. RT will continue to monitor.

## 2020-01-11 NOTE — Progress Notes (Signed)
Hypertensive all day. Will d/c midodrine completely at this time.

## 2020-01-12 ENCOUNTER — Inpatient Hospital Stay (HOSPITAL_COMMUNITY): Payer: Medicare Other

## 2020-01-12 DIAGNOSIS — J9601 Acute respiratory failure with hypoxia: Secondary | ICD-10-CM | POA: Diagnosis not present

## 2020-01-12 LAB — RENAL FUNCTION PANEL
Albumin: 1.8 g/dL — ABNORMAL LOW (ref 3.5–5.0)
Albumin: 2 g/dL — ABNORMAL LOW (ref 3.5–5.0)
Anion gap: 11 (ref 5–15)
Anion gap: 8 (ref 5–15)
BUN: 85 mg/dL — ABNORMAL HIGH (ref 8–23)
BUN: 59 mg/dL — ABNORMAL HIGH (ref 8–23)
CO2: 27 mmol/L (ref 22–32)
CO2: 27 mmol/L (ref 22–32)
Calcium: 8.4 mg/dL — ABNORMAL LOW (ref 8.9–10.3)
Calcium: 8.4 mg/dL — ABNORMAL LOW (ref 8.9–10.3)
Chloride: 102 mmol/L (ref 98–111)
Chloride: 102 mmol/L (ref 98–111)
Creatinine, Ser: 1.86 mg/dL — ABNORMAL HIGH (ref 0.44–1.00)
Creatinine, Ser: 1.39 mg/dL — ABNORMAL HIGH (ref 0.44–1.00)
GFR calc Af Amer: 31 mL/min — ABNORMAL LOW (ref >60)
GFR calc Af Amer: 44 mL/min — ABNORMAL LOW (ref >60)
GFR calc non Af Amer: 27 mL/min — ABNORMAL LOW (ref >60)
GFR calc non Af Amer: 38 mL/min — ABNORMAL LOW (ref >60)
Glucose, Bld: 183 mg/dL — ABNORMAL HIGH (ref 70–99)
Glucose, Bld: 137 mg/dL — ABNORMAL HIGH (ref 70–99)
Phosphorus: 4.1 mg/dL (ref 2.5–4.6)
Phosphorus: 3.6 mg/dL (ref 2.5–4.6)
Potassium: 4.1 mmol/L (ref 3.5–5.1)
Potassium: 4.4 mmol/L (ref 3.5–5.1)
Sodium: 140 mmol/L (ref 135–145)
Sodium: 137 mmol/L (ref 135–145)

## 2020-01-12 LAB — TYPE AND SCREEN
ABO/RH(D): O NEG
Antibody Screen: NEGATIVE
Unit division: 0

## 2020-01-12 LAB — GLUCOSE, CAPILLARY
Glucose-Capillary: 152 mg/dL — ABNORMAL HIGH (ref 70–99)
Glucose-Capillary: 154 mg/dL — ABNORMAL HIGH (ref 70–99)
Glucose-Capillary: 146 mg/dL — ABNORMAL HIGH (ref 70–99)
Glucose-Capillary: 106 mg/dL — ABNORMAL HIGH (ref 70–99)
Glucose-Capillary: 107 mg/dL — ABNORMAL HIGH (ref 70–99)
Glucose-Capillary: 174 mg/dL — ABNORMAL HIGH (ref 70–99)

## 2020-01-12 LAB — BPAM RBC
Blood Product Expiration Date: 202103132359
ISSUE DATE / TIME: 202103071329
Unit Type and Rh: 9500

## 2020-01-12 LAB — CBC
HCT: 26.2 % — ABNORMAL LOW (ref 36.0–46.0)
Hemoglobin: 8.1 g/dL — ABNORMAL LOW (ref 12.0–15.0)
MCH: 28.3 pg (ref 26.0–34.0)
MCHC: 30.9 g/dL (ref 30.0–36.0)
MCV: 91.6 fL (ref 80.0–100.0)
Platelets: 47 10*3/uL — ABNORMAL LOW (ref 150–400)
RBC: 2.86 MIL/uL — ABNORMAL LOW (ref 3.87–5.11)
RDW: 18.1 % — ABNORMAL HIGH (ref 11.5–15.5)
WBC: 5.5 10*3/uL (ref 4.0–10.5)
nRBC: 0 % (ref 0.0–0.2)

## 2020-01-12 LAB — MAGNESIUM: Magnesium: 2.2 mg/dL (ref 1.7–2.4)

## 2020-01-12 MED ORDER — VITAL AF 1.2 CAL PO LIQD
1000.0000 mL | ORAL | Status: DC
  Administered 2020-01-12 – 2020-01-15 (×4): 1000 mL

## 2020-01-12 MED ORDER — DARBEPOETIN ALFA 100 MCG/0.5ML IJ SOSY
100.0000 ug | PREFILLED_SYRINGE | INTRAMUSCULAR | Status: DC
  Administered 2020-01-12 – 2020-01-19 (×2): 100 ug via SUBCUTANEOUS
  Filled 2020-01-12 (×3): qty 0.5

## 2020-01-12 MED ORDER — GENERIC EXTERNAL MEDICATION
Status: DC
Start: ? — End: 2020-01-12

## 2020-01-12 NOTE — Progress Notes (Signed)
NAME:  Bailey Cox, MRN:  [id number], DOB:  11/18/1945, LOS: 7 ADMISSION DATE:  01/05/2020, CONSULTATION DATE:  01/06/20 REFERRING MD:  Tegeler EDP, CHIEF COMPLAINT:   Shock  Brief History   74 year old female admitted 3/1 from [place] where she had just arrived after long admission to [facility] where she suffered PEA arrest during endoscopy and ended up requiring tracheostomy. Presents to [facility] ED with widespread peripheral edema and acidosis.   History of present illness   74 year old female with PMH as below, which is significant for long admission in the [facility] system from 10/2019 all the way through 01/05/2020.  She was originally hospitalized in September 2020 with acute cholecystitis and underwent a cholecystectomy.  She was then sent to SNF for short duration but had return to [facility] to have a bile duct stent placed ultimately returning to SNF.  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  While hospitalized she developed fluid overload and was ultimately transferred to [facility] for dialysis.  Her course after that point become somewhat unclear, but as described by her family she was on and off the ventilator for several procedures and was eventually left intubated.  One of these procedures was an endoscopy during which she unfortunately suffered a cardiac arrest.  Details of the arrest are unclear.  She remained on the ventilator and ultimately underwent tracheostomy.  Her hospital course was also complicated by MRSA pneumonia.  She was ultimately able to come off of hemodialysis and was treated intermittently with diuresis.  It sounds like she bounced in and out of the ICU a few times with volume overload.  On the tail end of her admission she still struggled with kidney disease and was being treated for a urinary tract infection with cefepime and vancomycin.  Plans were to stop on 3/2.  She was discharged from the [facility] system on 3/1 to  [facility] in [place].  Upon arrival to [facility] they deemed her too sick for admission and transferred her to [facility] emergency department with complaints of volume overload and acidosis.   Past Medical History   has a past medical history of Chronic respiratory failure ([place]), CKD (chronic kidney disease), DM (diabetes mellitus) ([place]), GERD (gastroesophageal reflux disease), HTN (hypertension), Ogilvie syndrome, Tracheostomy status ([place]), and Vascular dementia ([place]).  Significant Hospital Events   07/2019 admit to [facility] for cholecystitis  09/2019 tx to [facility] for renal failure/volume overload. HD started.  10/2019 PEA arrest during EGD . Intubated 11/2019 Trach. HD stopped.   Consults:  Nephrology  WOC IR  Procedures:  PICC pre admit > Trach pre admit > 3/5 IJ vas cath->  Significant Diagnostic Tests:  Echo 10/24/2019 > LVEF 55%, Grade 1 DD. RV systolic function normal.  Echo 3/2: LVEF 65-70%, grade II dysfunction. RVSP  Micro Data:  Blood 3/1 >> ng Urine 3/1 >> ng resp 3/4 >> pseudomonas   Antimicrobials:  Meropenem 2/21 >> off  Cefepime Pre hosp (for tracheitis )  >> 3/1-> 3/7 Vancomycin pre hosp >> 3/2   Interim history/subjective:  No acute events overnight, getting bath during assessment. Tolerating ATC.   Objective   Blood pressure (!) 139/55, pulse 73, temperature (!) 95.5 F (35.3 C), resp. rate 15, height 5\' 4"  (1.626 m), weight 111.7 kg, SpO2 100 %.    Vent Mode: SIMV;PSV FiO2 (%):  [40 %] 40 % Set Rate:  [16 bmp] 16 bmp Vt Set:  [  360 mL] 360 mL PEEP:  [5 cmH20] 5 cmH20 Pressure Support:  [20 cmH20] [place] Pressure:  [16 cmH20-17 cmH20] 17 cmH20   Intake/Output Summary (Last 24 hours) at 01/12/2020 1044 Last data filed at 01/12/2020 1000 Gross per 24 hour  Intake 2197.87 ml  Output 4055 ml  Net -1857.13 ml   Filed Weights   01/09/20 1130 01/09/20 1240 01/10/20 0318  Weight: 116 kg 116.2 kg 111.7 kg     Examination: General: Chronically ill appearing elderly obese female on ATC this am, in NAD HEENT: Trach midline, MM pink/moist, PERRL,  Neuro: Alert with eyes open spontaneously,  CV: s1s2 regular rate and rhythm, no murmur, rubs, or gallops,  PULM:  Tolerating transition to ATC this am, faint bilateral rhonchi, no increased work of breathing  GI: soft, bowel sounds active in all 4 quadrants, non-tender, non-distended, tolerating TF Extremities: warm/dry, generalized edema  Skin: no rashes or lesions  Lab Results  Component Value Date   WBC 5.5 01/12/2020   HGB 8.1 (L) 01/12/2020   HCT 26.2 (L) 01/12/2020   MCV 91.6 01/12/2020   PLT 47 (L) 01/12/2020   Lab Results  Component Value Date   CREATININE 1.86 (H) 01/12/2020   BUN 85 (H) 01/12/2020   NA 140 01/12/2020   K 4.1 01/12/2020   CL 102 01/12/2020   CO2 27 01/12/2020    Resolved problems:  Acidosis   Assessment & Plan:   Septic shock-UTI was treated with meropenem 2/21 Pansensitive pseudomonas pneumonia  P: Off pressor support  Remains hypertensive, midodrine stopped  Continue IV antibiotics  Follow CXR as needed   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 P: Trend lipase, downtrending   Normocytic anemia Thrombocytopenia -S/P 1 unit PRBC this admission  -Plt 47 3/8 -HIT antibody negative at 0.167 P: Transfuse per protocol  HGB gola > 7 Trend CBC and Plt  Continue to hold heparin   Best practice:  Diet: NPO, TFs Pain/Anxiety/Delirium protocol (if  indicated): NA VAP protocol (if indicated): NA DVT prophylaxis: heparin SQ... held with plts in 60's GI prophylaxis: NA Glucose control: SSI Mobility: BR Code Status: FULL Family Communication: daughter via phone 3/7 Disposition: ICU  Critical care time:    Performed by: Johnsie Cancel  Total critical care time: 40 minutes  Critical care time was exclusive of separately billable procedures and treating other patients.  Critical care was necessary to treat or prevent imminent or life-threatening deterioration.  Critical care was time spent personally by me on the following activities: development of treatment plan with patient and/or surrogate as well as nursing, discussions with consultants, evaluation of patient's response to treatment, examination of patient, obtaining history from patient or surrogate, ordering and performing treatments and interventions, ordering and review of laboratory studies, ordering and review of radiographic studies, pulse oximetry and re-evaluation of patient's condition.   Johnsie Cancel, NP-C [facility] Contact / Pager information can be found on Amion  01/12/2020, 11:03 AM

## 2020-01-12 NOTE — Progress Notes (Signed)
Initial Nutrition Assessment  DOCUMENTATION CODES:   Morbid obesity  INTERVENTION:   Change pt to Vital 1.2 @ 45 ml/hr (1080 ml/day) via PEG 60 ml Prostat BID  Provides: 1696 kcal, 141 grams protein, and 875 ml free water.   NUTRITION DIAGNOSIS:   Inadequate oral intake related to inability to eat as evidenced by NPO status.  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    Pt discussed during ICU rounds and with RN. Pt tolerating TC.   3/7 CRRT initiated    Medications reviewed and include: Nabicarb 650 mg BID Labs reviewed: Na 134 (L), BUN/Cr: 85/1.86 (H)  UOP: 1900 ml CRRT: 1693  TF: Vital 1.5 @ 35 ml/hr via PEG 60 ml Prostat BID Provides: 1660 kcal, 116 grams protein  Diet Order:    Diet Order            Diet NPO time specified  Diet effective now              EDUCATION NEEDS:   No education needs have been identified at this time  Skin:  Skin Assessment: (weeping arms)  Last BM:  3/7  Height:   Ht Readings from Last 1 Encounters:  01/05/20 5\' 4"  (1.626 m)    Weight:   Wt Readings from Last 1 Encounters:  01/10/20 111.7 kg    Ideal Body Weight:  54.5 kg  BMI:  Body mass index is 42.27 kg/m.  Estimated Nutritional Needs:   Kcal:  1700-1900  Protein:  110-136 grams  Fluid:  >1.5 L/day  Lockie Pares., RD, LDN, CNSC See AMiON for contact information

## 2020-01-12 NOTE — Progress Notes (Signed)
RT placed patient on 40% 10L ATC with RN at bedside. Patient is tolerating well at this time. RT will continue to monitor.

## 2020-01-12 NOTE — Progress Notes (Signed)
Interdry applied to lower abd and groin area.

## 2020-01-12 NOTE — Consult Note (Signed)
WOC Nurse Consult Note: Patient receiving care in [place] [phone].  Consult completed remotely. Reason for Consult: "MASD in folds" Wound type: Pressure Injury POA: Yes/No/NA Per nursing order by Dulcy Fanny, RN, patient has MASD Intertriginous Dermatitis and is requesting InterDry for wicking ability.  Order for product placed. Thank you for the consult.  [place] nurse will not follow at this time.  Please re-consult the [place] team if needed.  Val Riles, RN, MSN, CWOCN, CNS-BC, pager [phone]

## 2020-01-12 NOTE — Progress Notes (Signed)
[facility] Progress Note    74 y.o.femalewho had a prolonged 81 day hospitalization at [facility] with a history of HTN COPD DM hypothyroidism Ogilvie's syndrome who was transferred to [place] on 12/02/2019. PEA arrest during EGD in early December with duodenal ulcer noted on EGD.Intermittent hyperkalemia treatet medically w/ Kayexalate and now recently w/ worsening renal function over the past few days prior to admission to [facility]. She actually had a foley placed 12/28/19 when the renal function was worsening with return of purulence - started on Meropenem. Per review of chart the pt did not want RRT but after urging by family agreed to it. She was also started on Cefepime and Vanco for tracheitis in Feb.  Assessment/ Plan:   1. AKI on CKD with most likely cause of the AKI being ATN from sepsis.  - She is not a good candidate for dialysis.  - [name] [phone] and [name] [phone]; both have POA.  I spoke with Katharine Look 3/4 and they've never refused RRT; actually was on dialysis for a mth at [facility].  Attempted iHD 3/5 but short treatment as cath was not working -> cath has since been converted by [facility]IR to [facility]- then started CRRT on 3/7 for clearance and also for the anasarca/ volume management-  Negative 1400 over last 24 hours and BUN now in the 80's - lowest it has been.  BP is not low and made 1900 of urine-  Will keep on CRRT mostly for volume.  I am just concerned about the endpoint-  With trach if she remains HD dep the only option is LTACH  2. Sepsis -  No longer on pressors -  broad spectrum abx(cefepime and vanc) 3. ?urosepsis - will recheck urinalysis microscopy and culture, cefepime /vanc on 2/25 to 3/2 -> urine is clearer 4. H/o PEA arrest 5. MRSA PNA on vanc/cefepime -> cefepime 6. Respiratory failure on trach 7. Htn.vol-  Massive volume overload-  BP is good, will challenge with UF 8. Anemia-  Check iron  stores and give ESA  9. elytes-  Phos and K are OK   Subjective:   Off pressors.    Objective:   BP (!) 139/55   Pulse 73   Temp (!) 95.5 F (35.3 C)   Resp 15   Ht _0  (1.626 m)   Wt 111.7 kg   SpO2 100%   BMI 42.27 kg/m   Intake/Output Summary (Last 24 hours) at 01/12/2020 1030 Last data filed at 01/12/2020 1000 Gross per 24 hour  Intake 2197.87 ml  Output 4055 ml  Net -1857.13 ml   Weight change:   Physical Exam: GEN: NAD,trached HEENT: No conjunctival pallor, EOMI NECK: Supple, no thyromegaly LUNGS: CTA B/L  CV: RRR, No M/R/G ABD: SNDNT +BS  EXT:2-3+ lowerextremity edema diffusely ACCESS: RIJ TC  Imaging: DG CHEST PORT 1 VIEW  Result Date: 01/12/2020 CLINICAL DATA:  Respiratory failure.  Follow-up study. EXAM: PORTABLE CHEST 1 VIEW COMPARISON:  01/09/2020 FINDINGS: Bilateral pleural effusions are without significant change from the most recent prior study. Also stable are bilateral interstitial and hazy airspace lung opacities. No new lung abnormalities. No pneumothorax. Tracheostomy tube and right PICC are stable. Right internal jugular dual-lumen central venous catheter tip projects more inferiorly, at or just below the caval atrial junction. IMPRESSION: 1. No change in bilateral interstitial and hazy airspace lung opacities and bilateral pleural effusions when compared to the most recent prior study. No new lung  abnormalities. 2. Well-positioned support apparatus. Electronically Signed   By: Lajean Manes M.D.   On: 01/12/2020 05:32   VAS [place] LOWER EXTREMITY VENOUS (DVT)  Result Date: 01/11/2020  Lower Venous DVTStudy Indications: Edema.  Limitations: Body habitus, ventilation, and Significant pitting edema. Comparison Study: No prior study on file for comparison. Performing Technologist: Sharion Dove RVS  Examination Guidelines: A complete evaluation includes B-mode imaging, spectral Doppler, color Doppler, and power Doppler as needed of all accessible portions of  each vessel. Bilateral testing is considered an integral part of a complete examination. Limited examinations for reoccurring indications may be performed as noted. The reflux portion of the exam is performed with the patient in reverse Trendelenburg.  +---------+---------------+---------+-----------+----------+-------------------+ RIGHT    CompressibilityPhasicitySpontaneityPropertiesThrombus Aging      +---------+---------------+---------+-----------+----------+-------------------+ CFV      Full           Yes      Yes                                      +---------+---------------+---------+-----------+----------+-------------------+ SFJ      Full                                                             +---------+---------------+---------+-----------+----------+-------------------+ FV Prox                                               patent by color and                                                       Doppler             +---------+---------------+---------+-----------+----------+-------------------+ FV Mid                                                Not visualized      +---------+---------------+---------+-----------+----------+-------------------+ FV Distal                                             Not visualized      +---------+---------------+---------+-----------+----------+-------------------+ PFV                                                   Not visualized      +---------+---------------+---------+-----------+----------+-------------------+ POP      Full  pulsatile           +---------+---------------+---------+-----------+----------+-------------------+ PTV      Full                                                             +---------+---------------+---------+-----------+----------+-------------------+ PERO     Full                                                              +---------+---------------+---------+-----------+----------+-------------------+   +---------+---------------+---------+-----------+----------+-------------------+ LEFT     CompressibilityPhasicitySpontaneityPropertiesThrombus Aging      +---------+---------------+---------+-----------+----------+-------------------+ CFV      Full                                         pulsatile flow      +---------+---------------+---------+-----------+----------+-------------------+ SFJ      Full                                                             +---------+---------------+---------+-----------+----------+-------------------+ FV Prox  Full                                                             +---------+---------------+---------+-----------+----------+-------------------+ FV Mid                                                Not visualized      +---------+---------------+---------+-----------+----------+-------------------+ FV Distal                                             Not visualized      +---------+---------------+---------+-----------+----------+-------------------+ PFV                                                   patent by color and                                                       Doppler             +---------+---------------+---------+-----------+----------+-------------------+ PTV      Full                                                             +---------+---------------+---------+-----------+----------+-------------------+  PERO                                                  Not visualized      +---------+---------------+---------+-----------+----------+-------------------+     Summary: RIGHT: - There is no evidence of deep vein thrombosis in the lower extremity. However, portions of this examination were limited- see technologist comments above.  LEFT: - There is no evidence of deep vein thrombosis in the lower  extremity. However, portions of this examination were limited- see technologist comments above.  *See table(s) above for measurements and observations. Electronically signed by Monica Martinez MD on 01/11/2020 at 11:02:35 AM.    Final    VAS [place] UPPER EXTREMITY VENOUS DUPLEX  Result Date: 01/11/2020 UPPER VENOUS STUDY  Indications: Edema Limitations: Body habitus, line and trach collar, contraction of arms, pitting edema. Comparison Study: No prior study on file Performing Technologist: Sharion Dove RVS  Examination Guidelines: A complete evaluation includes B-mode imaging, spectral Doppler, color Doppler, and power Doppler as needed of all accessible portions of each vessel. Bilateral testing is considered an integral part of a complete examination. Limited examinations for reoccurring indications may be performed as noted.  Right Findings: +----------+------------+---------+-----------+----------+---------------------+ RIGHT     CompressiblePhasicitySpontaneousProperties       Summary        +----------+------------+---------+-----------+----------+---------------------+ IJV           Full       Yes       Yes                                    +----------+------------+---------+-----------+----------+---------------------+ Subclavian               Yes       Yes                                    +----------+------------+---------+-----------+----------+---------------------+ Brachial                 Yes       Yes               PICC line present,                                                        patent by color and                                                             Doppler        +----------+------------+---------+-----------+----------+---------------------+ Radial                                                 Not visualized     +----------+------------+---------+-----------+----------+---------------------+ Ulnar  Not visualized     +----------+------------+---------+-----------+----------+---------------------+ Cephalic      Full                                                        +----------+------------+---------+-----------+----------+---------------------+ Basilic                                                Not visualized     +----------+------------+---------+-----------+----------+---------------------+  Left Findings: +----------+------------+---------+-----------+----------+---------------------+ LEFT      CompressiblePhasicitySpontaneousProperties       Summary        +----------+------------+---------+-----------+----------+---------------------+ IJV                                                    Not visualized     +----------+------------+---------+-----------+----------+---------------------+ Subclavian    Full       Yes       Yes                                    +----------+------------+---------+-----------+----------+---------------------+ Axillary      Full       Yes       Yes                                    +----------+------------+---------+-----------+----------+---------------------+ Brachial      Full       Yes       Yes                                    +----------+------------+---------+-----------+----------+---------------------+ Radial                                                 Not visualized     +----------+------------+---------+-----------+----------+---------------------+ Ulnar                                                  Not visualized     +----------+------------+---------+-----------+----------+---------------------+ Cephalic      Full                                                        +----------+------------+---------+-----------+----------+---------------------+ Basilic                  Yes       Yes               patent by color and  Doppler        +----------+------------+---------+-----------+----------+---------------------+  Summary:  Right: No evidence of deep vein thrombosis in the visualized veins of upper extremity. No evidence of superficial vein thrombosis in the visualized veins of the upper extremity.  Left: No evidence of deep vein thrombosis in the visualized veins of the upper extremity. No evidence of superficial vein thrombosis in the upper extremity.  *See table(s) above for measurements and observations.  Diagnosing physician: Monica Martinez MD Electronically signed by Monica Martinez MD on 01/11/2020 at 11:02:14 AM.    Final     Labs: BMET Recent Labs  Lab 01/08/20 0559 01/09/20 8867 01/09/20 1430 01/10/20 0417 01/10/20 1140 01/11/20 0610 01/11/20 0810 01/11/20 1713 01/12/20 0614  NA 136 139 137  --  141  --  142 142 140  K 4.4 4.6 4.4  --  3.9  --  4.6 4.4 4.1  CL 98 98 97*  --  107  --  101 104 102  CO2 _0 --  20*  --  _1 GLUCOSE 179* 101* 114*  --  136*  --  171* 170* 183*  BUN 140* 152* 138*  --  127*  --  148* 137* 85*  CREATININE 2.85* 3.08* 2.99*  --  2.73*  --  3.21* 2.95* 1.86*  CALCIUM 8.9 9.5 9.5  --  8.2*  --  9.2 9.0 8.4*  PHOS 6.7* 6.9* 6.6* 6.4*  --  6.9*  --  6.2* 4.1   CBC Recent Labs  Lab 01/05/20 2004 01/05/20 2030 01/09/20 0554 01/09/20 0554 01/09/20 1430 01/11/20 0810 01/11/20 1714 01/12/20 0614  WBC 9.3   < > 7.3  --  7.3 7.2  --  5.5  NEUTROABS 7.9*  --   --   --   --   --   --   --   HGB 9.4*   < > 7.2*   < > 7.5* 6.7* 8.6* 8.1*  HCT 30.9*   < > 22.8*   < > 23.7* 21.9* 27.0* 26.2*  MCV 93.1   < > 89.4  --  89.1 91.6  --  91.6  PLT 89*   < > 83*  --  67* 61*  --  47*   < > = values in this interval not displayed.    Medications:    . chlorhexidine gluconate (MEDLINE KIT)  15 mL Mouth Rinse BID  . Chlorhexidine Gluconate Cloth  6 each Topical Daily  . feeding supplement (PRO-STAT SUGAR FREE 64)  60 mL  Per Tube BID  . insulin aspart  0-15 Units Subcutaneous Q4H  . mouth rinse  15 mL Mouth Rinse 10 times per day  . pantoprazole (PROTONIX) IV  40 mg Intravenous Q12H  . sodium bicarbonate  650 mg Per Tube BID  . sodium chloride flush  10-40 mL Intracatheter Q12H      Arlett Goold A Trinity Hyland  01/12/2020, 10:30 AM

## 2020-01-13 DIAGNOSIS — J9621 Acute and chronic respiratory failure with hypoxia: Secondary | ICD-10-CM

## 2020-01-13 LAB — RENAL FUNCTION PANEL
Albumin: 2.1 g/dL — ABNORMAL LOW (ref 3.5–5.0)
Albumin: 2.3 g/dL — ABNORMAL LOW (ref 3.5–5.0)
Anion gap: 10 (ref 5–15)
Anion gap: 7 (ref 5–15)
BUN: 48 mg/dL — ABNORMAL HIGH (ref 8–23)
BUN: 35 mg/dL — ABNORMAL HIGH (ref 8–23)
CO2: 28 mmol/L (ref 22–32)
CO2: 28 mmol/L (ref 22–32)
Calcium: 8.5 mg/dL — ABNORMAL LOW (ref 8.9–10.3)
Calcium: 8.4 mg/dL — ABNORMAL LOW (ref 8.9–10.3)
Chloride: 103 mmol/L (ref 98–111)
Chloride: 106 mmol/L (ref 98–111)
Creatinine, Ser: 1.15 mg/dL — ABNORMAL HIGH (ref 0.44–1.00)
Creatinine, Ser: 1.01 mg/dL — ABNORMAL HIGH (ref 0.44–1.00)
GFR calc Af Amer: 55 mL/min — ABNORMAL LOW (ref >60)
GFR calc Af Amer: >60 mL/min (ref >60)
GFR calc non Af Amer: 48 mL/min — ABNORMAL LOW (ref >60)
GFR calc non Af Amer: 56 mL/min — ABNORMAL LOW (ref >60)
Glucose, Bld: 171 mg/dL — ABNORMAL HIGH (ref 70–99)
Glucose, Bld: 125 mg/dL — ABNORMAL HIGH (ref 70–99)
Phosphorus: 3.1 mg/dL (ref 2.5–4.6)
Phosphorus: 2.5 mg/dL (ref 2.5–4.6)
Potassium: 4.5 mmol/L (ref 3.5–5.1)
Potassium: 4.6 mmol/L (ref 3.5–5.1)
Sodium: 141 mmol/L (ref 135–145)
Sodium: 141 mmol/L (ref 135–145)

## 2020-01-13 LAB — GLUCOSE, CAPILLARY
Glucose-Capillary: 156 mg/dL — ABNORMAL HIGH (ref 70–99)
Glucose-Capillary: 143 mg/dL — ABNORMAL HIGH (ref 70–99)
Glucose-Capillary: 106 mg/dL — ABNORMAL HIGH (ref 70–99)
Glucose-Capillary: 114 mg/dL — ABNORMAL HIGH (ref 70–99)
Glucose-Capillary: 141 mg/dL — ABNORMAL HIGH (ref 70–99)
Glucose-Capillary: 159 mg/dL — ABNORMAL HIGH (ref 70–99)

## 2020-01-13 LAB — URINALYSIS, ROUTINE W REFLEX MICROSCOPIC
Bilirubin Urine: NEGATIVE
Glucose, UA: NEGATIVE mg/dL
Ketones, ur: NEGATIVE mg/dL
Nitrite: NEGATIVE
Protein, ur: >=300 mg/dL — AB
Specific Gravity, Urine: 1.018 (ref 1.005–1.030)
WBC, UA: >50 WBC/hpf — ABNORMAL HIGH (ref 0–5)
pH: 5 (ref 5.0–8.0)

## 2020-01-13 LAB — IRON AND TIBC
Iron: 81 ug/dL (ref 28–170)
Saturation Ratios: 31 % (ref 10.4–31.8)
TIBC: 263 ug/dL (ref 250–450)
UIBC: 182 ug/dL

## 2020-01-13 LAB — FERRITIN: Ferritin: 886 ng/mL — ABNORMAL HIGH (ref 11–307)

## 2020-01-13 LAB — MAGNESIUM: Magnesium: 2.3 mg/dL (ref 1.7–2.4)

## 2020-01-13 MED ORDER — PANTOPRAZOLE SODIUM 40 MG PO PACK
40.0000 mg | PACK | ORAL | Status: DC
  Administered 2020-01-13 – 2020-02-16 (×35): 40 mg
  Filled 2020-01-13 (×35): qty 20

## 2020-01-13 MED ORDER — SODIUM CHLORIDE 0.9 % IV SOLN: INTRAVENOUS | Status: DC | PRN

## 2020-01-13 NOTE — Progress Notes (Signed)
NAME:  Bailey Cox, MRN:  [id number], DOB:  1946-05-06, LOS: 8 ADMISSION DATE:  01/05/2020, CONSULTATION DATE:  01/06/20 REFERRING MD:  [place] EDP, CHIEF COMPLAINT:   Shock  Brief History   74 year old female admitted 3/1 from [place] where she had just arrived after long admission to [facility] where she suffered PEA arrest during endoscopy and ended up requiring tracheostomy. Presents to [facility] ED with widespread peripheral edema and acidosis.   History of present illness   74 year old female with PMH as below, which is significant for long admission in the [facility] system from 10/2019 all the way through 01/05/2020.  She was originally hospitalized in September 2020 with acute cholecystitis and underwent a cholecystectomy.  She was then sent to SNF for short duration but had return to [facility] to have a bile duct stent placed ultimately returning to SNF.  She then returned to [facility] with bowel dilation secondary to Ogilvie's syndrome.  While hospitalized she developed fluid overload and was ultimately transferred to [facility] for dialysis.  Her course after that point become somewhat unclear, but as described by her family she was on and off the ventilator for several procedures and was eventually left intubated.  One of these procedures was an endoscopy during which she unfortunately suffered a cardiac arrest.  Details of the arrest are unclear.  She remained on the ventilator and ultimately underwent tracheostomy.  Her hospital course was also complicated by MRSA pneumonia.  She was ultimately able to come off of hemodialysis and was treated intermittently with diuresis.  It sounds like she bounced in and out of the ICU a few times with volume overload.  On the tail end of her admission she still struggled with kidney disease and was being treated for a urinary tract infection with cefepime and vancomycin.  Plans were to stop on 3/2.  She was discharged from the [facility] system on 3/1 to  [facility] in [place].  Upon arrival to [facility] they deemed her too sick for admission and transferred her to [facility] emergency department with complaints of volume overload and acidosis.   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  Significant Hospital Events   07/2019 admit to [facility] for cholecystitis  09/2019 tx to [facility] for renal failure/volume overload. HD started.  10/2019 PEA arrest during EGD . Intubated 11/2019 Trach  Consults:  Nephrology  [facility] IR  Procedures:  PICC pre admit > Trach pre admit > 3/5 IJ vas cath->  Significant Diagnostic Tests:  Echo 10/24/2019 > LVEF 55%, Grade 1 DD. RV systolic function normal.  Echo 3/2: LVEF 65-70%, grade II dysfunction. RVSP  Micro Data:  Blood 3/1 >> ng Urine 3/1 >> ng resp 3/4 >> pseudomonas   Antimicrobials:  Meropenem 2/21 >> off  Cefepime Pre hosp (for tracheitis )  >> 3/1-> 3/7 Vancomycin pre hosp >> 3/2   Interim history/subjective:  Remains on CRRT.  Still has respiratory secretions.    Objective   Blood pressure (!) 146/61, pulse 85, temperature (!) 97.3 F (36.3 C), temperature source Oral, resp. rate 20, height 5\' 4"  (1.626 m), weight 105.9 kg, SpO2 100 %.    FiO2 (%):  [35 %-40 %] 35 %   Intake/Output Summary (Last 24 hours) at 01/13/2020 4696 Last data  filed at 01/13/2020 0600 Gross per 24 hour  Intake 1155.75 ml  Output 5523 ml  Net -4367.25 ml   Filed Weights   01/09/20 1240 01/10/20 0318 01/13/20 0500  Weight: 116.2 kg 111.7 kg 105.9 kg    Examination:  General - alert Eyes - pupils reactive ENT - trach site clean Cardiac - regular rate/rhythm, no murmur Chest - b/l crackles Abdomen - soft, non tender, + bowel sounds Extremities - 2+ edema  Skin - dry skin Neuro - follows simple commands   Resolved problems:  Metabolic acidosis, Septic shock, Elevated lipase  Assessment & Plan:   Acute on chronic hypoxic respiratory failure 2nd to Pseudomonal HCAP. Tracheostomy status. - off vent since 3/08 - trach care - bronchial hygiene - f/u CXR intermittently - complete ABx  AKI from ATN 2nd to sepsis. CKD 4. 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 - SSI  Anemia of critical illness and chronic disease. Thrombocytopenia. - f/u CBC - transfuse for Hb < 7 or significant bleeding   Best practice:  Diet: NPO, TFs DVT prophylaxis: heparin SQ... held with plts in 60's GI prophylaxis: protonix Mobility: BR Code Status: FULL Disposition: ICU  Labs:   CMP Latest Ref Rng & Units 01/12/2020 01/12/2020 01/11/2020  Glucose 70 - 99 mg/dL 137(H) 183(H) 170(H)  BUN 8 - 23 mg/dL 59(H) 85(H) 137(H)  Creatinine 0.44 - 1.00 mg/dL 1.39(H) 1.86(H) 2.95(H)  Sodium 135 - 145 mmol/L 137 140 142  Potassium 3.5 - 5.1 mmol/L 4.4 4.1 4.4  Chloride 98 - 111 mmol/L 102 102 104  CO2 22 - 32 mmol/L 27 27 24   Calcium 8.9 - 10.3 mg/dL 8.4(L) 8.4(L) 9.0  Total Protein 6.5 - 8.1 g/dL - - -  Total Bilirubin 0.3 - 1.2 mg/dL - - -  Alkaline Phos 38 - 126 U/L - - -  AST 15 - 41 U/L - - -  ALT 0 - 44 U/L - - -    CBC Latest Ref Rng & Units 01/12/2020 01/11/2020 01/11/2020  WBC 4.0 - 10.5 K/uL 5.5 - 7.2  Hemoglobin 12.0 - 15.0 g/dL 8.1(L) 8.6(L) 6.7(LL)  Hematocrit 36.0 - 46.0 % 26.2(L) 27.0(L) 21.9(L)  Platelets 150 - 400 K/uL 47(L) - 61(L)    ABG    Component Value Date/Time   PHART 7.225 (L) 01/06/2020 1305   PCO2ART 45.8 01/06/2020 1305   PO2ART 115.0 (H) 01/06/2020 1305   HCO3 18.8 (L) 01/06/2020 1305   TCO2 20 (L) 01/06/2020 1305   ACIDBASEDEF 8.0 (H) 01/06/2020 1305   O2SAT 97.0 01/06/2020 1305    CBG (last 3)  Recent Labs    01/12/20 1924 01/12/20 2311 01/13/20 0315  GLUCAP [place], MD [place] 01/13/2020, 6:20 AM

## 2020-01-13 NOTE — Progress Notes (Signed)
[facility] Progress Note    74 y.o.femalewho had a prolonged 81 day hospitalization at [facility] with a history of HTN COPD DM hypothyroidism Ogilvie's syndrome who was transferred to [place] on 12/02/2019. PEA arrest during EGD in early December with duodenal ulcer noted on EGD.Intermittent hyperkalemia treatet medically w/ Kayexalate and now recently w/ worsening renal function over the past few days prior to admission to [facility]. She actually had a foley placed 12/28/19 when the renal function was worsening with return of purulence - started on Meropenem. Per review of chart the pt did not want RRT but after urging by family agreed to it.   Assessment/ Plan:   1. AKI on CKD with most likely cause of the AKI being ATN from sepsis.  - She is not a good candidate for long term dialysis.  - [name] [phone] and [name] [phone]; both have POA.  I spoke with Katharine Look 3/4 and they've never refused RRT; actually was on dialysis for a mth at [facility].  Attempted iHD 3/5 but short treatment as cath was not working -> cath has since been converted by [facility] to [facility]- then started CRRT on 3/7 for clearance and also for the anasarca/ volume management-  Negative 4400 over last 24 hours and BUN now in the 40's - lowest it has been. CRRT is having impact we want.  BP is not low and made 800 of urine-  Will keep on CRRT mostly for volume.  I am just concerned about the endpoint-  With trach if she remains HD dep the only option is LTACH  2. Sepsis -  No longer on pressors -  broad spectrum abx(cefepime and vanc) 3. ?urosepsis - will recheck urinalysis microscopy and culture, cefepime /vanc on 2/25 to 3/2 -> urine is clearer clinically 4. H/o PEA arrest 5. MRSA PNA on vanc/cefepime  6. Respiratory failure on trach 7. Htn.vol-  Massive volume overload-  BP is good, will continue to challenge with UF 8. Anemia-  Check iron stores and  giving ESA  9. elytes-  Phos and K are OK   Subjective:   Tolerating aggressive volume removal very well with CRRT-  Hypothermic-  Much more alert per nursing-  Kind of looks miserable    Objective:   BP 126/75   Pulse 92   Temp (!) 97.3 F (36.3 C)   Resp (!) 22   Ht '5\' 4"'$  (1.626 m)   Wt 105.9 kg   SpO2 (!) 74%   BMI 40.07 kg/m   Intake/Output Summary (Last 24 hours) at 01/13/2020 1040 Last data filed at 01/13/2020 1000 Gross per 24 hour  Intake 1195.75 ml  Output 5930 ml  Net -4734.25 ml   Weight change:   Physical Exam: GEN: NAD,trached HEENT: No conjunctival pallor, EOMI NECK: Supple, no thyromegaly LUNGS: CTA B/L  CV: RRR, No M/R/G ABD: SNDNT +BS  EXT:2-3+ lowerextremity edema diffusely ACCESS: RIJ TC  Imaging: DG CHEST PORT 1 VIEW  Result Date: 01/12/2020 CLINICAL DATA:  Respiratory failure.  Follow-up study. EXAM: PORTABLE CHEST 1 VIEW COMPARISON:  01/09/2020 FINDINGS: Bilateral pleural effusions are without significant change from the most recent prior study. Also stable are bilateral interstitial and hazy airspace lung opacities. No new lung abnormalities. No pneumothorax. Tracheostomy tube and right PICC are stable. Right internal jugular dual-lumen central venous catheter tip projects more inferiorly, at or just below the caval atrial junction. IMPRESSION: 1. No change in bilateral interstitial and  hazy airspace lung opacities and bilateral pleural effusions when compared to the most recent prior study. No new lung abnormalities. 2. Well-positioned support apparatus. Electronically Signed   By: Lajean Manes M.D.   On: 01/12/2020 05:32    Labs: BMET Recent Labs  Lab 01/09/20 1430 01/10/20 0417 01/10/20 1140 01/11/20 0610 01/11/20 0810 01/11/20 1713 01/12/20 0614 01/12/20 1945 01/13/20 0500  NA 137  --  141  --  142 142 140 137 141  K 4.4  --  3.9  --  4.6 4.4 4.1 4.4 4.5  CL 97*  --  107  --  101 104 102 102 103  CO2 23  --  20*  --  '24 24 27 27 28   '$ GLUCOSE 114*  --  136*  --  171* 170* 183* 137* 171*  BUN 138*  --  127*  --  148* 137* 85* 59* 48*  CREATININE 2.99*  --  2.73*  --  3.21* 2.95* 1.86* 1.39* 1.15*  CALCIUM 9.5  --  8.2*  --  9.2 9.0 8.4* 8.4* 8.5*  PHOS 6.6* 6.4*  --  6.9*  --  6.2* 4.1 3.6 3.1   CBC Recent Labs  Lab 01/09/20 0554 01/09/20 0554 01/09/20 1430 01/11/20 0810 01/11/20 1714 01/12/20 0614  WBC 7.3  --  7.3 7.2  --  5.5  HGB 7.2*   < > 7.5* 6.7* 8.6* 8.1*  HCT 22.8*   < > 23.7* 21.9* 27.0* 26.2*  MCV 89.4  --  89.1 91.6  --  91.6  PLT 83*  --  67* 61*  --  47*   < > = values in this interval not displayed.    Medications:    . chlorhexidine gluconate (MEDLINE KIT)  15 mL Mouth Rinse BID  . Chlorhexidine Gluconate Cloth  6 each Topical Daily  . darbepoetin (ARANESP) injection - NON-DIALYSIS  100 mcg Subcutaneous Q Mon-1800  . feeding supplement (PRO-STAT SUGAR FREE 64)  60 mL Per Tube BID  . insulin aspart  0-15 Units Subcutaneous Q4H  . mouth rinse  15 mL Mouth Rinse 10 times per day  . pantoprazole sodium  40 mg Per Tube Q24H  . sodium bicarbonate  650 mg Per Tube BID  . sodium chloride flush  10-40 mL Intracatheter Q12H      Vaishali Baise A Camesha Farooq  01/13/2020, 10:40 AM

## 2020-01-13 NOTE — Progress Notes (Signed)
1800: At this time patient is refusing all mouth care, blood pressure monitoring, and warming methods. MD aware.   Lianne Bushy RN BSN.

## 2020-01-14 DIAGNOSIS — J9611 Chronic respiratory failure with hypoxia: Secondary | ICD-10-CM | POA: Diagnosis not present

## 2020-01-14 LAB — CBC
HCT: 27.8 % — ABNORMAL LOW (ref 36.0–46.0)
Hemoglobin: 8.3 g/dL — ABNORMAL LOW (ref 12.0–15.0)
MCH: 28.5 pg (ref 26.0–34.0)
MCHC: 29.9 g/dL — ABNORMAL LOW (ref 30.0–36.0)
MCV: 95.5 fL (ref 80.0–100.0)
Platelets: 54 10*3/uL — ABNORMAL LOW (ref 150–400)
RBC: 2.91 MIL/uL — ABNORMAL LOW (ref 3.87–5.11)
RDW: 18.3 % — ABNORMAL HIGH (ref 11.5–15.5)
WBC: 6.8 10*3/uL (ref 4.0–10.5)
nRBC: 0 % (ref 0.0–0.2)

## 2020-01-14 LAB — RENAL FUNCTION PANEL
Albumin: 2.2 g/dL — ABNORMAL LOW (ref 3.5–5.0)
Albumin: 2.2 g/dL — ABNORMAL LOW (ref 3.5–5.0)
Anion gap: 7 (ref 5–15)
Anion gap: 11 (ref 5–15)
BUN: 31 mg/dL — ABNORMAL HIGH (ref 8–23)
BUN: 26 mg/dL — ABNORMAL HIGH (ref 8–23)
CO2: 27 mmol/L (ref 22–32)
CO2: 27 mmol/L (ref 22–32)
Calcium: 8.2 mg/dL — ABNORMAL LOW (ref 8.9–10.3)
Calcium: 8.1 mg/dL — ABNORMAL LOW (ref 8.9–10.3)
Chloride: 104 mmol/L (ref 98–111)
Chloride: 98 mmol/L (ref 98–111)
Creatinine, Ser: 0.86 mg/dL (ref 0.44–1.00)
Creatinine, Ser: 0.84 mg/dL (ref 0.44–1.00)
GFR calc Af Amer: >60 mL/min (ref >60)
GFR calc Af Amer: >60 mL/min (ref >60)
GFR calc non Af Amer: >60 mL/min (ref >60)
GFR calc non Af Amer: >60 mL/min (ref >60)
Glucose, Bld: 136 mg/dL — ABNORMAL HIGH (ref 70–99)
Glucose, Bld: 268 mg/dL — ABNORMAL HIGH (ref 70–99)
Phosphorus: 2.2 mg/dL — ABNORMAL LOW (ref 2.5–4.6)
Phosphorus: 10.3 mg/dL — ABNORMAL HIGH (ref 2.5–4.6)
Potassium: 4.2 mmol/L (ref 3.5–5.1)
Potassium: 4.4 mmol/L (ref 3.5–5.1)
Sodium: 138 mmol/L (ref 135–145)
Sodium: 136 mmol/L (ref 135–145)

## 2020-01-14 LAB — GLUCOSE, CAPILLARY
Glucose-Capillary: 149 mg/dL — ABNORMAL HIGH (ref 70–99)
Glucose-Capillary: 123 mg/dL — ABNORMAL HIGH (ref 70–99)
Glucose-Capillary: 110 mg/dL — ABNORMAL HIGH (ref 70–99)
Glucose-Capillary: 128 mg/dL — ABNORMAL HIGH (ref 70–99)
Glucose-Capillary: 70 mg/dL (ref 70–99)
Glucose-Capillary: 95 mg/dL (ref 70–99)

## 2020-01-14 LAB — MAGNESIUM: Magnesium: 2.4 mg/dL (ref 1.7–2.4)

## 2020-01-14 MED ORDER — PAROXETINE HCL 30 MG PO TABS
30.0000 mg | ORAL_TABLET | Freq: Every day | ORAL | Status: DC
  Filled 2020-01-14: qty 1

## 2020-01-14 MED ORDER — SODIUM PHOSPHATES 45 MMOLE/15ML IV SOLN
20.0000 mmol | Freq: Once | INTRAVENOUS | Status: AC
  Administered 2020-01-14: 20 mmol via INTRAVENOUS
  Filled 2020-01-14: qty 6.67

## 2020-01-14 MED ORDER — ALPRAZOLAM 0.25 MG PO TABS
0.2500 mg | ORAL_TABLET | Freq: Three times a day (TID) | ORAL | Status: DC | PRN
  Filled 2020-01-14: qty 1

## 2020-01-14 MED ORDER — PAROXETINE HCL 30 MG PO TABS
30.0000 mg | ORAL_TABLET | Freq: Every day | ORAL | Status: DC
  Administered 2020-01-14 – 2020-01-26 (×13): 30 mg
  Filled 2020-01-14 (×13): qty 1

## 2020-01-14 MED ORDER — ALPRAZOLAM 0.25 MG PO TABS
0.2500 mg | ORAL_TABLET | Freq: Three times a day (TID) | ORAL | Status: DC | PRN
  Administered 2020-01-14 – 2020-01-27 (×13): 0.25 mg
  Filled 2020-01-14 (×13): qty 1

## 2020-01-14 NOTE — Progress Notes (Signed)
1156: This nurse called into patients room by patient, tracheostomy sitting on patients chest. Patient at this time yelling "help." RT called, this nurse attempted to replace tracheostomy with much resistance. RT able to reinsert tracheostomy. Stoma site noted to have increase in bleeding. Patient educated on need for tracheostomy. MD aware, new order for restraints obtained. Will continue to monitor. Lianne Bushy RN BSN.

## 2020-01-14 NOTE — Progress Notes (Addendum)
NAME:  Danyell Shader, MRN:  [id number], DOB:  [birth year], LOS: 9 ADMISSION DATE:  01/05/2020, CONSULTATION DATE:  01/06/20 REFERRING MD:  [place] EDP, CHIEF COMPLAINT:   Shock  Brief History   74 year old female admitted 3/1 from [place] where she had just arrived after long admission to [facility] where she suffered PEA arrest during endoscopy and ended up requiring tracheostomy. Presents to [facility] ED with widespread peripheral edema and acidosis.   History of present illness   74 year old female with PMH as below, which is significant for long admission in the [facility] system from 10/2019 all the way through 01/05/2020.  She was originally hospitalized in September 2020 with acute cholecystitis and underwent a cholecystectomy.  She was then sent to SNF for short duration but had return to [facility] to have a bile duct stent placed ultimately returning to SNF.  She then returned to [facility] with bowel dilation secondary to Ogilvie's syndrome.  While hospitalized she developed fluid overload and was ultimately transferred to [facility] for dialysis.  Her course after that point become somewhat unclear, but as described by her family she was on and off the ventilator for several procedures and was eventually left intubated.  One of these procedures was an endoscopy during which she unfortunately suffered a cardiac arrest.  Details of the arrest are unclear.  She remained on the ventilator and ultimately underwent tracheostomy.  Her hospital course was also complicated by MRSA pneumonia.  She was ultimately able to come off of hemodialysis and was treated intermittently with diuresis.  It sounds like she bounced in and out of the ICU a few times with volume overload.  On the tail end of her admission she still struggled with kidney disease and was being treated for a urinary tract infection with cefepime and vancomycin.  Plans were to stop on 3/2.  She was discharged from the [facility] system on 3/1 to  [facility] in [place].  Upon arrival to [facility] they deemed her too sick for admission and transferred her to [facility] emergency department with complaints of volume overload and acidosis.   Past Medical History   has a past medical history of Chronic respiratory failure ([place]), CKD (chronic kidney disease), DM (diabetes mellitus) ([place]), GERD (gastroesophageal reflux disease), HTN (hypertension), Ogilvie syndrome, Tracheostomy status ([place]), and Vascular dementia ([place]).  Significant Hospital Events   07/2019 admit to [facility] for cholecystitis  09/2019 tx to [facility] for renal failure/volume overload. HD started.  10/2019 PEA arrest during EGD . Intubated 11/2019 Trach  Consults:  Nephrology  WOC IR  Procedures:  PICC pre admit > Trach pre admit > 3/5 IJ vas cath->  Significant Diagnostic Tests:  Echo 10/24/2019 > LVEF 55%, Grade 1 DD. RV systolic function normal.  Echo 3/2: LVEF 65-70%, grade II dysfunction. RVSP  Micro Data:  Blood 3/1 >> ng Urine 3/1 >> ng resp 3/4 >> pseudomonas   Antimicrobials:  Meropenem 2/21 >> off  Cefepime Pre hosp (for tracheitis )  >> 3/1-> 3/7 Vancomycin pre hosp >> 3/2   Interim history/subjective:  Remains on CRRT with volume removal.  Has remained hemodynamically stable and off the vent  Objective   Blood pressure 136/79, pulse 76, temperature (!) 97.4 F (36.3 C), temperature source Axillary, resp. rate 20, height 5\' 4"  (1.626 m), weight 99.9 kg, SpO2 100 %.    FiO2 (%):  [35 %-98 %] 35 %   Intake/Output Summary (Last 24 hours) at  01/14/2020 0739 Last data filed at 01/14/2020 0700 Gross per 24 hour  Intake 1270 ml  Output 6106 ml  Net -4836 ml   Filed Weights   01/10/20 0318 01/13/20 0500 01/14/20 0500  Weight: 111.7 kg 105.9 kg 99.9 kg    Examination: Gen:      No acute distress, trying to mouth words HEENT:  EOMI, sclera anicteric Neck:     No masses; no thyromegaly Lungs:    Clear to auscultation bilaterally;  normal respiratory effort CV:         Regular rate and rhythm; no murmurs Abd:      + bowel sounds; soft, non-tender; no palpable masses, no distension Ext:   2+ edema; adequate peripheral perfusion Skin:      Warm and dry; no rash Neuro: Awake, responsive, follows commands   Resolved problems:  Metabolic acidosis, Septic shock, Elevated lipase  Assessment & Plan:   Acute on chronic hypoxic respiratory failure 2nd to Pseudomonal HCAP Sepsis present on admission. Tracheostomy status. Off vent since 3/08 Continue trach care, bronchial hygiene.  She has finished antibiotics for Pseudomonas We will engage speech to try Passy-Muir valves.  May need trach downsizing soon. Start PT, mobilize  AKI from ATN 2nd to sepsis. CKD 4. Will come off CRRT today per renal.  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 SSI coverage  Anemia of critical illness and chronic disease. Thrombocytopenia. Follow CBC, transfuse for hemoglobin less than 7  Disposition We will need to start looking for placement if she is able to transition off CRRT to intermittent HD. May need out-of-state placement due to trach and dialysis Consult case management  Anxiety Start home Paxil at half dose and Xanax as needed  Labs significant for glucose 136, creatinine 0.87 BBC 6.8, hemoglobin 8.3 platelets 50 No new imaging  Best practice:  Diet: NPO, TFs DVT prophylaxis: heparin SQ... held with plts in 60's GI prophylaxis: protonix Mobility: BR Code Status: FULL Disposition: ICU  Marshell Garfinkel MD  Pulmonary and Critical Care Please see [email or web address] for pager details.  01/14/2020, 7:56 AM

## 2020-01-14 NOTE — Progress Notes (Signed)
[name] Progress Note    74 y.o.femalewho had a prolonged 81 day hospitalization at [facility] with a history of HTN COPD DM hypothyroidism Ogilvie's syndrome. PEA arrest during EGD in early December with duodenal ulcer noted on EGD.Intermittent hyperkalemia treatet medically w/ Kayexalate and now recently w/ worsening renal function over the past few days prior to admission to [facility]. She actually had a foley placed 12/28/19 when the renal function was worsening with return of purulence - started on Meropenem. Per review of chart the pt did not want RRT but after urging by family agreed to it.   Assessment/ Plan:   1. AKI on CKD with most likely cause of the AKI being ATN from sepsis.  - She is not a good candidate for long term dialysis due to trach- would need to go to  [facility].  - Granddaughter is Bailey Cox [phone] and daughter is Bailey Cox [phone]; both have POA.  Dr. Augustin Cox spoke with Bailey Cox 3/4 and they've never refused RRT; actually was on dialysis for a mth at [facility].  Attempted iHD 3/5 but short treatment as cath was not working -> cath has since been converted by [facility] to [facility]- then started CRRT on 3/7 for clearance and also for the anasarca/ volume management-  Negative 4800 over last 24 hours and BUN now in the 30's. CRRT is having impact we want.  BP is not low and made some urine-  Will keep on CRRT mostly for volume maybe just another day.  I am just concerned about the endpoint-  With trach if she remains HD dep the only option is LTACH assuming that she will tolerate regular HD.  Might be good idea to get palliative care involved as we will have some heavy discussions coming up-  If wants to cont HD if it means going to [facility]  2. Sepsis -  No longer on pressors -  broad spectrum abx(cefepime and vanc) 3. ?urosepsis - will recheck urinalysis microscopy and culture, cefepime /vanc on 2/25 to 3/2 -> urine is clearer clinically 4. H/o PEA  arrest 5. MRSA PNA on vanc/cefepime  6. Respiratory failure on trach 7. Htn.vol-  Massive volume overload-  BP is good, will continue to challenge with UF 8. Anemia-   iron stores OK- and giving ESA  9. elytes-  Phos and K are OK   Subjective:   Tolerating aggressive volume removal very well still with CRRT-  Hypothermic-  Much more alert -  Smile on face but seems to indicate she is having CP-  Anxiety seems to be an issue   Objective:   BP (!) 122/53   Pulse 76   Temp (!) 97.5 F (36.4 C) (Axillary) Comment: Simultaneous filing. User may not have seen previous data. Comment (Src): Simultaneous filing. User may not have seen previous data.  Resp 19   Ht '5\' 4"'$  (1.626 m)   Wt 99.9 kg   SpO2 97%   BMI 37.80 kg/m   Intake/Output Summary (Last 24 hours) at 01/14/2020 0940 Last data filed at 01/14/2020 0900 Gross per 24 hour  Intake 1270 ml  Output 6102 ml  Net -4832 ml   Weight change: -6 kg  Physical Exam: GEN: NAD,trached--- very alert ! HEENT: No conjunctival pallor, EOMI NECK: Supple, no thyromegaly LUNGS: CTA B/L  CV: RRR, No M/R/G ABD: SNDNT +BS  EXT:2-3+ lowerextremity edema diffusely- still  ACCESS: RIJ TC  Imaging: No results found.  Labs: [employer]  Lab 01/10/20 1140 01/11/20 0610 01/11/20 0810 01/11/20 1713 01/12/20 0614 01/12/20 1945 01/13/20 0500 01/13/20 1627 01/14/20 0502  NA   < >  --  142 142 140 137 141 141 138  K   < >  --  4.6 4.4 4.1 4.4 4.5 4.6 4.2  CL   < >  --  101 104 102 102 103 106 104  CO2   < >  --  '24 24 27 27 28 28 27  '$ GLUCOSE   < >  --  171* 170* 183* 137* 171* 125* 136*  BUN   < >  --  148* 137* 85* 59* 48* 35* 31*  CREATININE   < >  --  3.21* 2.95* 1.86* 1.39* 1.15* 1.01* 0.86  CALCIUM   < >  --  9.2 9.0 8.4* 8.4* 8.5* 8.4* 8.2*  PHOS  --  6.9*  --  6.2* 4.1 3.6 3.1 2.5 2.2*   < > = values in this interval not displayed.   CBC Recent Labs  Lab 01/09/20 1430 01/09/20 1430 01/11/20 0810 01/11/20 1714  01/12/20 0614 01/14/20 0502  WBC 7.3  --  7.2  --  5.5 6.8  HGB 7.5*   < > 6.7* 8.6* 8.1* 8.3*  HCT 23.7*   < > 21.9* 27.0* 26.2* 27.8*  MCV 89.1  --  91.6  --  91.6 95.5  PLT 67*  --  61*  --  47* 54*   < > = values in this interval not displayed.    Medications:    . chlorhexidine gluconate (MEDLINE KIT)  15 mL Mouth Rinse BID  . Chlorhexidine Gluconate Cloth  6 each Topical Daily  . darbepoetin (ARANESP) injection - NON-DIALYSIS  100 mcg Subcutaneous Q Mon-1800  . feeding supplement (PRO-STAT SUGAR FREE 64)  60 mL Per Tube BID  . insulin aspart  0-15 Units Subcutaneous Q4H  . mouth rinse  15 mL Mouth Rinse 10 times per day  . pantoprazole sodium  40 mg Per Tube Q24H  . PARoxetine  30 mg Oral Daily  . sodium bicarbonate  650 mg Per Tube BID  . sodium chloride flush  10-40 mL Intracatheter Q12H      Bailey Cox A Bailey Cox  01/14/2020, 9:40 AM

## 2020-01-14 NOTE — Progress Notes (Signed)
PT Cancellation Note  Patient Details Name: Bailey Cox MRN: [id number] DOB: June 05, 1946   Cancelled Treatment:    Reason Eval/Treat Not Completed: Medical issues which prohibited therapy - Pt removed trach x2 this am, RN requesting PT hold until tomorrow. Will check back.  [facility] Pager [phone]  Office [phone]    [place] 01/14/2020, 1:41 PM

## 2020-01-14 NOTE — Progress Notes (Signed)
Patient just pulled her trach out for the 2nd time this morning. 6 shiley was reinserted with assistance from [place] RRT. Patient now has a lot of bleeding at the stoma. Stoma cleaned before trach reinserted.

## 2020-01-14 NOTE — TOC Initial Note (Signed)
Transition of Care [facility]) - Initial/Assessment Note    Patient Details  Name: Shaqueta Casady MRN: [id number] Date of Birth: Sep 24, 1946  [facility] of [facility]) CM/SW Contact:    Ella Bodo, RN Phone Number: 01/14/2020, 4:59 PM  Clinical Narrative:   74 year old female admitted 3/1 from [facility] where she had just arrived after long admission to [facility] where she suffered PEA arrest during endoscopy and ended up requiring tracheostomy. Presents to [facility] ED with widespread peripheral edema and acidosis.  Pt currently on CRRT; should pt/family wish to continue HD, may need to consider return to [facility] hospital due to tracheostomy.  Please consult TOC team for LTAC referral if pt able to tolerate intermittent dialysis and wishes to continue. Will follow.                Expected Discharge Plan: Long Term Acute Care (LTAC) Barriers to Discharge: Continued Medical Work up   Patient Goals and CMS Choice        Expected Discharge Plan and Services Expected Discharge Plan: [place] (LTAC)   Discharge Planning Services: CM Consult   Living arrangements for the past 2 months: Post-Acute Facility                                      Prior Living Arrangements/Services Living arrangements for the past 2 months: [facility]                                Admission diagnosis:  Shock ([place]) [R57.9] Peripheral edema [R60.9] Hypothermia, initial encounter [T68.XXXA] Hypotension, unspecified hypotension type [I95.9] Sepsis, due to unspecified organism, unspecified whether acute organ dysfunction present [facility]) [A41.9] Patient Active Problem List   Diagnosis Date Noted  . Hypotension   . Sepsis ([place])   . Renal failure 01/06/2020  . Tracheostomy status ([place]) 01/06/2020  . Chronic hypoxemic respiratory failure ([place]) 01/06/2020  . Shock ([place]) 01/05/2020   PCP:  Patient, No Pcp Per Pharmacy:  No Pharmacies Listed    Social Determinants of  Health (SDOH) Interventions    Readmission Risk Interventions No flowsheet data found.   Reinaldo Raddle, RN, BSN  Trauma/Neuro ICU Case Manager [phone]

## 2020-01-14 NOTE — Progress Notes (Addendum)
RT called due to patient trach becoming dislodged. Lurline Idol was reinserted & Dr Vaughan Browner assessed the airway with the bronchoscope. Decision was made to remove the original 6 xlt & replace with regular 6 cuffed shiley. Once 6 shiley was placed Dr Vaughan Browner assessed placement with bronchoscope. Lurline Idol is in good position & secured. Patient tolerated entire procedure very well.

## 2020-01-15 DIAGNOSIS — N189 Chronic kidney disease, unspecified: Secondary | ICD-10-CM

## 2020-01-15 DIAGNOSIS — N179 Acute kidney failure, unspecified: Secondary | ICD-10-CM | POA: Diagnosis not present

## 2020-01-15 LAB — CBC
HCT: 26.1 % — ABNORMAL LOW (ref 36.0–46.0)
Hemoglobin: 7.9 g/dL — ABNORMAL LOW (ref 12.0–15.0)
MCH: 29.2 pg (ref 26.0–34.0)
MCHC: 30.3 g/dL (ref 30.0–36.0)
MCV: 96.3 fL (ref 80.0–100.0)
Platelets: 55 10*3/uL — ABNORMAL LOW (ref 150–400)
RBC: 2.71 MIL/uL — ABNORMAL LOW (ref 3.87–5.11)
RDW: 18.2 % — ABNORMAL HIGH (ref 11.5–15.5)
WBC: 7.8 10*3/uL (ref 4.0–10.5)
nRBC: 0 % (ref 0.0–0.2)

## 2020-01-15 LAB — RENAL FUNCTION PANEL
Albumin: 2.2 g/dL — ABNORMAL LOW (ref 3.5–5.0)
Albumin: 2.3 g/dL — ABNORMAL LOW (ref 3.5–5.0)
Anion gap: 7 (ref 5–15)
Anion gap: 7 (ref 5–15)
BUN: 23 mg/dL (ref 8–23)
BUN: 23 mg/dL (ref 8–23)
CO2: 27 mmol/L (ref 22–32)
CO2: 28 mmol/L (ref 22–32)
Calcium: 8.4 mg/dL — ABNORMAL LOW (ref 8.9–10.3)
Calcium: 8.2 mg/dL — ABNORMAL LOW (ref 8.9–10.3)
Chloride: 102 mmol/L (ref 98–111)
Chloride: 102 mmol/L (ref 98–111)
Creatinine, Ser: 0.69 mg/dL (ref 0.44–1.00)
Creatinine, Ser: 0.7 mg/dL (ref 0.44–1.00)
GFR calc Af Amer: >60 mL/min (ref >60)
GFR calc Af Amer: >60 mL/min (ref >60)
GFR calc non Af Amer: >60 mL/min (ref >60)
GFR calc non Af Amer: >60 mL/min (ref >60)
Glucose, Bld: 147 mg/dL — ABNORMAL HIGH (ref 70–99)
Glucose, Bld: 145 mg/dL — ABNORMAL HIGH (ref 70–99)
Phosphorus: 2.1 mg/dL — ABNORMAL LOW (ref 2.5–4.6)
Phosphorus: 2.9 mg/dL (ref 2.5–4.6)
Potassium: 4.3 mmol/L (ref 3.5–5.1)
Potassium: 4.3 mmol/L (ref 3.5–5.1)
Sodium: 136 mmol/L (ref 135–145)
Sodium: 137 mmol/L (ref 135–145)

## 2020-01-15 LAB — GLUCOSE, CAPILLARY
Glucose-Capillary: 114 mg/dL — ABNORMAL HIGH (ref 70–99)
Glucose-Capillary: 114 mg/dL — ABNORMAL HIGH (ref 70–99)
Glucose-Capillary: 115 mg/dL — ABNORMAL HIGH (ref 70–99)
Glucose-Capillary: 129 mg/dL — ABNORMAL HIGH (ref 70–99)
Glucose-Capillary: 131 mg/dL — ABNORMAL HIGH (ref 70–99)
Glucose-Capillary: 133 mg/dL — ABNORMAL HIGH (ref 70–99)

## 2020-01-15 LAB — MAGNESIUM: Magnesium: 2.4 mg/dL (ref 1.7–2.4)

## 2020-01-15 MED ORDER — SODIUM PHOSPHATES 45 MMOLE/15ML IV SOLN
20.0000 mmol | Freq: Once | INTRAVENOUS | Status: AC
  Administered 2020-01-15: 20 mmol via INTRAVENOUS
  Filled 2020-01-15: qty 6.67

## 2020-01-15 NOTE — Progress Notes (Signed)
[facility] Progress Note    74 y.o.femalewho had a prolonged 81 day hospitalization at [facility] with a history of HTN COPD DM hypothyroidism Ogilvie's syndrome. PEA arrest during EGD in early December with duodenal ulcer noted on EGD.Intermittent hyperkalemia treatet medically w/ Kayexalate and now recently w/ worsening renal function over the past few days prior to admission to [facility]. She actually had a foley placed 12/28/19 when the renal function was worsening with return of purulence - started on Meropenem. Per review of chart the pt did not want RRT but after urging by family agreed to it.   Assessment/ Plan:   1. AKI on CKD with most likely cause of the AKI being ATN from sepsis.  - She is not a good candidate for long term dialysis due to trach- would need to go to  [facility].  - [name] [phone] and [name] [phone]; both have POA.  Dr. Augustin Coupe spoke with Katharine Look 3/4 and they've never refused RRT; actually was on dialysis for a mth at [facility].  Attempted iHD 3/5 but short treatment as cath was not working -> cath converted by VIR to [facility]- then started CRRT on 3/7 for clearance and also for the anasarca/ volume management-  Negative another 4800 over last 24 hours- total of around 15 liters the last 3 days. Will stop CRRT.  Suspect will need ongoing RRT in the form of IHD.  I am just concerned about the endpoint-  With trach if she remains HD dep the only option is LTACH assuming that she will tolerate regular HD.  Might be good idea to get palliative care involved as we will have some heavy discussions coming up-  If wants to cont HD if it means going to [facility].  Nursing indicates that family is set on aggressive care  2. Sepsis -  No longer on pressors -  broad spectrum abx(cefepime and vanc) 3. ?urosepsis - will recheck urinalysis microscopy and culture, cefepime /vanc on 2/25 to 3/2 -> urine is clearer  clinically 4. H/o PEA arrest 5. MRSA PNA on vanc/cefepime  6. Respiratory failure on trach 7. Htn.vol-  Massive volume overload-  BP is good, will continue to challenge with UF 8. Anemia-   iron stores OK- giving ESA  9. elytes-  Phos is on the low side but will rise after we stop CRRT and K  OK   Subjective:   Tolerating aggressive volume removal very well still with CRRT-  Hypothermic-  Much more alert -  Smile on face    Objective:   BP (!) 124/54   Pulse 80   Temp (!) 96.3 F (35.7 C)   Resp 13   Ht '5\' 4"'$  (1.626 m)   Wt 96.5 kg   SpO2 100%   BMI 36.52 kg/m   Intake/Output Summary (Last 24 hours) at 01/15/2020 1036 Last data filed at 01/15/2020 1000 Gross per 24 hour  Intake 1501.62 ml  Output 6101 ml  Net -4599.38 ml   Weight change: -3.4 kg  Physical Exam: GEN: NAD,trached--- very alert ! HEENT: No conjunctival pallor, EOMI NECK: Supple, no thyromegaly LUNGS: CTA B/L  CV: RRR, No M/R/G ABD: SNDNT +BS  EXT:2-3+ lowerextremity edema diffusely- still  ACCESS: RIJ TC  Imaging: No results found.  Labs: BMET Recent Labs  Lab 01/12/20 0614 01/12/20 1945 01/13/20 0500 01/13/20 1627 01/14/20 0502 01/14/20 1610 01/15/20 0435  NA 140 137 141 141 138 136 136  K  4.1 4.4 4.5 4.6 4.2 4.4 4.3  CL 102 102 103 106 104 98 102  CO2 '27 27 28 28 27 27 27  '$ GLUCOSE 183* 137* 171* 125* 136* 268* 147*  BUN 85* 59* 48* 35* 31* 26* 23  CREATININE 1.86* 1.39* 1.15* 1.01* 0.86 0.84 0.69  CALCIUM 8.4* 8.4* 8.5* 8.4* 8.2* 8.1* 8.4*  PHOS 4.1 3.6 3.1 2.5 2.2* 10.3* 2.1*   CBC Recent Labs  Lab 01/11/20 0810 01/11/20 0810 01/11/20 1714 01/12/20 0614 01/14/20 0502 01/15/20 0435  WBC 7.2  --   --  5.5 6.8 7.8  HGB 6.7*   < > 8.6* 8.1* 8.3* 7.9*  HCT 21.9*   < > 27.0* 26.2* 27.8* 26.1*  MCV 91.6  --   --  91.6 95.5 96.3  PLT 61*  --   --  47* 54* 55*   < > = values in this interval not displayed.    Medications:    . chlorhexidine gluconate (MEDLINE KIT)  15 mL  Mouth Rinse BID  . Chlorhexidine Gluconate Cloth  6 each Topical Daily  . darbepoetin (ARANESP) injection - NON-DIALYSIS  100 mcg Subcutaneous Q Mon-1800  . feeding supplement (PRO-STAT SUGAR FREE 64)  60 mL Per Tube BID  . insulin aspart  0-15 Units Subcutaneous Q4H  . mouth rinse  15 mL Mouth Rinse 10 times per day  . pantoprazole sodium  40 mg Per Tube Q24H  . PARoxetine  30 mg Per Tube Daily  . sodium bicarbonate  650 mg Per Tube BID  . sodium chloride flush  10-40 mL Intracatheter Q12H      Dandy Lazaro A Burnette Sautter  01/15/2020, 10:36 AM

## 2020-01-15 NOTE — Progress Notes (Signed)
PT Cancellation Note  Patient Details Name: Bailey Cox MRN: [id number] DOB: 08/17/1946   Cancelled Treatment:    Reason Eval/Treat Not Completed: Medical issues which prohibited therapy.  Pt on CRRT, unable to converse and not able to sit EOB for an evaluation.  Will try back 3/12. 01/15/2020  Ginger Carne., PT Acute Rehabilitation Services [phone]  (pager) [phone]  (office)   Tessie Fass Sharell Hilmer 01/15/2020, 3:31 PM

## 2020-01-15 NOTE — Progress Notes (Signed)
NAME:  Bailey Cox, MRN:  [id number], DOB:  1946/01/10, LOS: 53 ADMISSION DATE:  01/05/2020, CONSULTATION DATE:  01/06/20 REFERRING MD:  Teg[place] EDP, CHIEF COMPLAINT:   Shock  Brief History   74 year old female admitted 3/1 from [place] where she had just arrived after long admission to [facility] where she suffered PEA arrest during endoscopy and ended up requiring tracheostomy. Presents to [facility] ED with widespread peripheral edema and acidosis.   History of present illness   74 year old female with PMH as below, which is significant for long admission in the [facility] system from 10/2019 all the way through 01/05/2020.  She was originally hospitalized in September 2020 with acute cholecystitis and underwent a cholecystectomy.  She was then sent to SNF for short duration but had return to [facility] to have a bile duct stent placed ultimately returning to SNF.  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  While hospitalized she developed fluid overload and was ultimately transferred to [facility] for dialysis.  Her course after that point become somewhat unclear, but as described by her family she was on and off the ventilator for several procedures and was eventually left intubated.  One of these procedures was an endoscopy during which she unfortunately suffered a cardiac arrest.  Details of the arrest are unclear.  She remained on the ventilator and ultimately underwent tracheostomy.  Her hospital course was also complicated by MRSA pneumonia.  She was ultimately able to come off of hemodialysis and was treated intermittently with diuresis.  It sounds like she bounced in and out of the ICU a few times with volume overload.  On the tail end of her admission she still struggled with kidney disease and was being treated for a urinary tract infection with cefepime and vancomycin.  Plans were to stop on 3/2.  She was discharged from the [facility] system on 3/1 to  [facility] in [place].  Upon arrival to [facility] they deemed her too sick for admission and transferred her to [facility] emergency department with complaints of volume overload and acidosis.   Past Medical History   has a past medical history of Chronic respiratory failure ([place]), CKD (chronic kidney disease), DM (diabetes mellitus) ([place]), GERD (gastroesophageal reflux disease), HTN (hypertension), Ogilvie syndrome, Tracheostomy status ([place]), and Vascular dementia ([place]).  Significant Hospital Events   07/2019 admit to [facility] for cholecystitis  09/2019 tx to [facility] for renal failure/volume overload. HD started.  10/2019 PEA arrest during EGD . Intubated 11/2019 Trach  Consults:  Nephrology  [facility] IR  Procedures:  PICC pre admit > Trach pre admit > 3/5 IJ vas cath->  Significant Diagnostic Tests:  Echo 10/24/2019 > LVEF 55%, Grade 1 DD. RV systolic function normal.  Echo 3/2: LVEF 65-70%, grade II dysfunction. RVSP  Micro Data:  Blood 3/1 >> ng Urine 3/1 >> ng resp 3/4 >> pseudomonas   Antimicrobials:  Meropenem 2/21 >> off  Cefepime Pre hosp (for tracheitis )  >> 3/1-> 3/7 Vancomycin pre hosp >> 3/2   Interim history/subjective:  Remains on CRRT today. Follows simple commands.  Objective   Blood pressure (!) 104/59, pulse 84, temperature (!) 97.5 F (36.4 C), temperature source Oral, resp. rate (!) 22, height 5\' 4"  (1.626 m), weight 96.5 kg, SpO2 100 %.    FiO2 (%):  [28 %-35 %] 35 %   Intake/Output Summary (Last 24 hours) at 01/15/2020 1213 Last data filed at  01/15/2020 1200 Gross per 24 hour  Intake 1520.8 ml  Output 6159 ml  Net -4638.2 ml   Filed Weights   01/13/20 0500 01/14/20 0500 01/15/20 0500  Weight: 105.9 kg 99.9 kg 96.5 kg   Physical Exam: General: Elderly, frail-appearing, no acute distress HENT: [employer], AT, OP clear, MMM Neck: Trach in place, c/d/i Eyes: EOMI, no scleral icterus Respiratory: Clear to auscultation bilaterally.  No crackles,  wheezing or rales Cardiovascular: RRR, -M/R/G, no JVD GI: BS+, soft, nontender Extremities:2+ pitting edema,-tenderness Neuro: Awake, alert and follows commands, moves extremities x 4  CBC Latest Ref Rng & Units 01/15/2020 01/14/2020 01/12/2020  WBC 4.0 - 10.5 K/uL 7.8 6.8 5.5  Hemoglobin 12.0 - 15.0 g/dL 7.9(L) 8.3(L) 8.1(L)  Hematocrit 36.0 - 46.0 % 26.1(L) 27.8(L) 26.2(L)  Platelets 150 - 400 K/uL 55(L) 54(L) 47(L)   BMP Latest Ref Rng & Units 01/15/2020 01/14/2020 01/14/2020  Glucose 70 - 99 mg/dL 147(H) 268(H) 136(H)  BUN 8 - 23 mg/dL 23 26(H) 31(H)  Creatinine 0.44 - 1.00 mg/dL 0.69 0.84 0.86  Sodium 135 - 145 mmol/L 136 136 138  Potassium 3.5 - 5.1 mmol/L 4.3 4.4 4.2  Chloride 98 - 111 mmol/L 102 98 104  CO2 22 - 32 mmol/L 27 27 27   Calcium 8.9 - 10.3 mg/dL 8.4(L) 8.1(L) 8.2(L)    Resolved problems:  Metabolic acidosis, Septic shock, Elevated lipase  Assessment & Plan:   Acute on chronic hypoxic respiratory failure secondary to Pseudomonal HCAP s/p antibiotics x 7d Tracheostomy status. Off vent since 3/08 Continue trach care, bronchial hygiene.   Speech consulted Start PT, mobilize  AKI from ATN 2nd to sepsis. CKD 4. CRRT per Nephrology. 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 SSI coverage  Anemia of critical illness and chronic disease. Thrombocytopenia. Follow CBC, transfuse for hemoglobin less than 7  Disposition We will need to start looking for placement if she is able to transition off CRRT to intermittent HD. May need out-of-state placement due to trach and dialysis Consult case management  Anxiety Continue home Paxil at half dose and Xanax as needed  Best practice:  Diet: NPO, TFs DVT prophylaxis: Holding subQ heparin for Plt ~50s GI prophylaxis: protonix Mobility: BR Code Status: FULL Disposition: ICU  The patient requires high complexity decision making for assessment and support, frequent evaluation and  titration of therapies, application of advanced monitoring technologies and extensive interpretation of multiple databases.   Critical Care Time devoted to patient care services described in this note is 31 Minutes.   Rodman Pickle, M.D. [facility] Pulmonary/Critical Care Medicine 01/15/2020 12:13 PM   Please see Amion for pager number to reach on-call Pulmonary and Critical Care Team.

## 2020-01-16 DIAGNOSIS — Z930 Tracheostomy status: Secondary | ICD-10-CM | POA: Diagnosis not present

## 2020-01-16 DIAGNOSIS — J9611 Chronic respiratory failure with hypoxia: Secondary | ICD-10-CM | POA: Diagnosis not present

## 2020-01-16 DIAGNOSIS — N189 Chronic kidney disease, unspecified: Secondary | ICD-10-CM | POA: Diagnosis not present

## 2020-01-16 DIAGNOSIS — N179 Acute kidney failure, unspecified: Secondary | ICD-10-CM | POA: Diagnosis not present

## 2020-01-16 LAB — RENAL FUNCTION PANEL
Albumin: 2.3 g/dL — ABNORMAL LOW (ref 3.5–5.0)
Albumin: 2.5 g/dL — ABNORMAL LOW (ref 3.5–5.0)
Anion gap: 8 (ref 5–15)
Anion gap: 9 (ref 5–15)
BUN: 35 mg/dL — ABNORMAL HIGH (ref 8–23)
BUN: 48 mg/dL — ABNORMAL HIGH (ref 8–23)
CO2: 28 mmol/L (ref 22–32)
CO2: 26 mmol/L (ref 22–32)
Calcium: 8.7 mg/dL — ABNORMAL LOW (ref 8.9–10.3)
Calcium: 8.7 mg/dL — ABNORMAL LOW (ref 8.9–10.3)
Chloride: 100 mmol/L (ref 98–111)
Chloride: 100 mmol/L (ref 98–111)
Creatinine, Ser: 0.91 mg/dL (ref 0.44–1.00)
Creatinine, Ser: 1.4 mg/dL — ABNORMAL HIGH (ref 0.44–1.00)
GFR calc Af Amer: >60 mL/min (ref >60)
GFR calc Af Amer: 44 mL/min — ABNORMAL LOW (ref >60)
GFR calc non Af Amer: >60 mL/min (ref >60)
GFR calc non Af Amer: 38 mL/min — ABNORMAL LOW (ref >60)
Glucose, Bld: 134 mg/dL — ABNORMAL HIGH (ref 70–99)
Glucose, Bld: 131 mg/dL — ABNORMAL HIGH (ref 70–99)
Phosphorus: 3.1 mg/dL (ref 2.5–4.6)
Phosphorus: 3.5 mg/dL (ref 2.5–4.6)
Potassium: 4.9 mmol/L (ref 3.5–5.1)
Potassium: 4.8 mmol/L (ref 3.5–5.1)
Sodium: 136 mmol/L (ref 135–145)
Sodium: 135 mmol/L (ref 135–145)

## 2020-01-16 LAB — GLUCOSE, CAPILLARY
Glucose-Capillary: 112 mg/dL — ABNORMAL HIGH (ref 70–99)
Glucose-Capillary: 123 mg/dL — ABNORMAL HIGH (ref 70–99)
Glucose-Capillary: 126 mg/dL — ABNORMAL HIGH (ref 70–99)
Glucose-Capillary: 128 mg/dL — ABNORMAL HIGH (ref 70–99)
Glucose-Capillary: 137 mg/dL — ABNORMAL HIGH (ref 70–99)
Glucose-Capillary: 157 mg/dL — ABNORMAL HIGH (ref 70–99)

## 2020-01-16 LAB — CBC
HCT: 25.7 % — ABNORMAL LOW (ref 36.0–46.0)
Hemoglobin: 7.6 g/dL — ABNORMAL LOW (ref 12.0–15.0)
MCH: 29.1 pg (ref 26.0–34.0)
MCHC: 29.6 g/dL — ABNORMAL LOW (ref 30.0–36.0)
MCV: 98.5 fL (ref 80.0–100.0)
Platelets: 67 10*3/uL — ABNORMAL LOW (ref 150–400)
RBC: 2.61 MIL/uL — ABNORMAL LOW (ref 3.87–5.11)
RDW: 18.4 % — ABNORMAL HIGH (ref 11.5–15.5)
WBC: 7.2 10*3/uL (ref 4.0–10.5)
nRBC: 0 % (ref 0.0–0.2)

## 2020-01-16 LAB — MAGNESIUM: Magnesium: 2.4 mg/dL (ref 1.7–2.4)

## 2020-01-16 MED ORDER — TRAZODONE HCL 50 MG PO TABS
25.0000 mg | ORAL_TABLET | Freq: Every day | ORAL | Status: DC
  Administered 2020-01-17 – 2020-02-23 (×37): 25 mg
  Filled 2020-01-16 (×38): qty 1

## 2020-01-16 MED ORDER — CHLORHEXIDINE GLUCONATE CLOTH 2 % EX PADS
6.0000 | MEDICATED_PAD | Freq: Every day | CUTANEOUS | Status: DC
  Administered 2020-01-17 – 2020-02-03 (×17): 6 via TOPICAL

## 2020-01-16 MED ORDER — TRAZODONE HCL 50 MG PO TABS
25.0000 mg | ORAL_TABLET | Freq: Every day | ORAL | Status: DC
  Administered 2020-01-16: 25 mg via ORAL
  Filled 2020-01-16: qty 1

## 2020-01-16 MED ORDER — OSMOLITE 1.5 CAL PO LIQD
1000.0000 mL | ORAL | Status: DC
  Administered 2020-01-16 – 2020-01-19 (×3): 1000 mL
  Filled 2020-01-16 (×5): qty 1000

## 2020-01-16 MED ORDER — GENERIC EXTERNAL MEDICATION
Status: DC
Start: ? — End: 2020-01-16

## 2020-01-16 NOTE — Plan of Care (Signed)
  Problem: Clinical Measurements: Goal: Cardiovascular complication will be avoided Outcome: Progressing   Problem: Activity: Goal: Risk for activity intolerance will decrease Outcome: Progressing   Problem: Nutrition: Goal: Adequate nutrition will be maintained Outcome: Progressing   Problem: Coping: Goal: Level of anxiety will decrease Outcome: Progressing   Problem: Pain Managment: Goal: General experience of comfort will improve Outcome: Progressing   Problem: Respiratory: Goal: Ability to maintain adequate ventilation will improve Outcome: Progressing

## 2020-01-16 NOTE — Progress Notes (Signed)
[facility] Progress Note    74 y.o.femalewho had a prolonged 81 day hospitalization at [facility] with a history of HTN COPD DM hypothyroidism Ogilvie's syndrome. PEA arrest during EGD in early December with duodenal ulcer noted on EGD.Intermittent hyperkalemia treatet medically w/ Kayexalate and now recently w/ worsening renal function over the past few days prior to admission to [facility]. She actually had a foley placed 12/28/19 when the renal function was worsening with return of purulence - started on Meropenem. Per review of chart the pt did not want RRT but after urging by family agreed to it.   Assessment/ Plan:   1. AKI on CKD with most likely cause of the AKI being ATN from sepsis.  - She is not a good candidate for long term dialysis due to trach- would need to go to  [facility].  - [name] [phone] and [name] [phone]; both have POA.  Dr. Augustin Coupe spoke with Katharine Look 3/4 and they've never refused RRT; actually was on dialysis for a mth at [facility].  They dont appear to be realistic regarding recovery  Attempted iHD 3/5  -> cath converted by VIR to TC- then started CRRT on 3/7-3/11 for clearance and also for the anasarca/ volume management-   stopped CRRT on 3/11.  Suspect will need ongoing RRT in the form of IHD.  I am just concerned about the endpoint-  With trach if she remains HD dep the only option is LTACH assuming that she will tolerate regular HD.  Might be good idea to get palliative care involved as we will have some heavy discussions coming up-  If wants to cont HD if it means going to [facility] likely for not a short amount of time.  Nursing indicates that family is set on aggressive care.  I will talk to them at some point-  Pt seems to understand on some level. Planning for IHD tomorrow  2. Sepsis -  No longer on pressors -  broad spectrum abx(cefepime and vanc) 3. ?urosepsis - will recheck urinalysis microscopy  and culture, cefepime /vanc on 2/25 to 3/2 -> urine is clearer clinically 4. H/o PEA arrest 5. MRSA PNA on vanc/cefepime  6. Respiratory failure on trach 7. Htn.vol-  Massive volume overload-  15 liters negative with CRRT-  Will cont UF as able on IHD 8. Anemia-   iron stores OK- giving ESA - transfuse PRN 9. elytes-  Phos is on the low side but will rise after we stop CRRT and K  OK   Subjective:   CRRT stopped yesterday- really no UOP and crt up but was so good after 4 days of CRRT still looks good   Objective:   BP (!) 141/80   Pulse 91   Temp 98.8 F (37.1 C)   Resp (!) 24   Ht _0  (1.626 m)   Wt 94.4 kg   SpO2 94%   BMI 35.72 kg/m   Intake/Output Summary (Last 24 hours) at 01/16/2020 1135 Last data filed at 01/16/2020 1005 Gross per 24 hour  Intake 1422.13 ml  Output 1252 ml  Net 170.13 ml   Weight change: -2.1 kg  Physical Exam: GEN: NAD,trached--- very alert ! Tried to work with PT-  Very nervous  HEENT: No conjunctival pallor, EOMI NECK: Supple, no thyromegaly LUNGS: CTA B/L  CV: RRR, No M/R/G ABD: SNDNT +BS  EXT:2+ lowerextremity edema diffusely- still  ACCESS: RIJ TC  Imaging: No results found.  Labs: BMET Recent Labs  Lab 01/13/20 0500 01/13/20 1627 01/14/20 0502 01/14/20 1610 01/15/20 0435 01/15/20 1703 01/16/20 0521  NA 141 141 138 136 136 137 136  K 4.5 4.6 4.2 4.4 4.3 4.3 4.9  CL 103 106 104 98 102 102 100  CO2 _0 GLUCOSE 171* 125* 136* 268* 147* 145* 134*  BUN 48* 35* 31* 26* 23 23 35*  CREATININE 1.15* 1.01* 0.86 0.84 0.69 0.70 0.91  CALCIUM 8.5* 8.4* 8.2* 8.1* 8.4* 8.2* 8.7*  PHOS 3.1 2.5 2.2* 10.3* 2.1* 2.9 3.1   CBC Recent Labs  Lab 01/12/20 0614 01/14/20 0502 01/15/20 0435 01/16/20 0521  WBC 5.5 6.8 7.8 7.2  HGB 8.1* 8.3* 7.9* 7.6*  HCT 26.2* 27.8* 26.1* 25.7*  MCV 91.6 95.5 96.3 98.5  PLT 47* 54* 55* 67*    Medications:    . chlorhexidine gluconate (MEDLINE KIT)  15 mL Mouth Rinse BID  .  Chlorhexidine Gluconate Cloth  6 each Topical Daily  . darbepoetin (ARANESP) injection - NON-DIALYSIS  100 mcg Subcutaneous Q Mon-1800  . feeding supplement (PRO-STAT SUGAR FREE 64)  60 mL Per Tube BID  . insulin aspart  0-15 Units Subcutaneous Q4H  . mouth rinse  15 mL Mouth Rinse 10 times per day  . pantoprazole sodium  40 mg Per Tube Q24H  . PARoxetine  30 mg Per Tube Daily  . sodium bicarbonate  650 mg Per Tube BID  . sodium chloride flush  10-40 mL Intracatheter Q12H  . traZODone  25 mg Oral QHS      Joud Pettinato A Fionna Merriott  01/16/2020, 11:35 AM

## 2020-01-16 NOTE — TOC Progression Note (Signed)
Transition of Care [facility]) - Progression Note    Patient Details  Name: Taiwan Millon MRN: [id number] Date of Birth: 01-10-1946  Transition of Care [facility] CM/SW Contact  Ella Bodo, RN Phone Number: 01/16/2020, 4:39 PM  Clinical Narrative:   Pt now on intermittent HD/off CRRT.  Per MD, pt ready for transition to [facility] when bed available.  I have spoken with pt's daughter, Katharine Look, and she is in favor of referring pt to both [place] in [place].  Referrals made to [place] and [place].  Will follow progress off CRRT and follow up with LTAC admission coordinators on Monday, 3/15.      Expected Discharge Plan: Long Term Acute Care (LTAC) Barriers to Discharge: Continued Medical Work up  Expected Discharge Plan and Services Expected Discharge Plan: [place] (LTAC)   Discharge Planning Services: CM Consult   Living arrangements for the past 2 months: [place]                                       Reinaldo Raddle, RN, BSN  Trauma/Neuro ICU Case Manager [phone]

## 2020-01-16 NOTE — Evaluation (Signed)
Physical Therapy Evaluation Patient Details Name: Bailey Cox MRN: [id number] DOB: 01/25/1946 Today's Date: 01/16/2020   History of Present Illness  77y F w PMHx of chronic respiratory failure on trach, AKI/CKD/ESRD, recent cardiac arrest, chronic mental impairment, MRSA pneumonia on vanc/cefepime presenting from [facility] for hypotension, volume overload. Patient has had extensive admission at [place] for almost 3 months, discharged yesterday to [facility] and immediately referred to ER from [facility].  Trach pre admission, CRRT on admission  Clinical Impression  Pt admitted with/for hypotension and volume overload.  Pt is significantly debilitated and needing Total assist for all mobility.  Pt currently limited functionally due to the problems listed. ( See problems list.)   Pt will benefit from PT to maximize function and safety in order to get ready for next venue listed below.     Follow Up Recommendations SNF;Supervision/Assistance - 24 hour    Equipment Recommendations  Other (comment)(TBA)    Recommendations for Other Services       Precautions / Restrictions Precautions Precautions: Fall      Mobility  Bed Mobility Overal bed mobility: Needs Assistance Bed Mobility: Sit to Supine;Supine to Sit;Rolling Rolling: Total assist   Supine to sit: Total assist Sit to supine: Total assist   General bed mobility comments: pt unable to assist transitions likely in part due to stiffness and significantly limited ROM and anxiety that she will fall.  Transfers Overall transfer level: Needs assistance               General transfer comment: transfer not safe to attempt today.  No assistance available.  Ambulation/Gait                Stairs            Wheelchair Mobility    Modified Rankin (Stroke Patients Only)       Balance Overall balance assessment: Needs assistance Sitting-balance support: Feet supported;Bilateral upper extremity supported;Single  extremity supported Sitting balance-Leahy Scale: Zero Sitting balance - Comments: pt very anxious and took over 5 min of sitting to relax enough to sitt relatively upright.  Unable to get knees flexed appropriately for sitting due to anxiety likely.                                     Pertinent Vitals/Pain Pain Assessment: Faces Faces Pain Scale: Hurts a little bit Pain Location: generalized Pain Descriptors / Indicators: Guarding;Grimacing Pain Intervention(s): Monitored during session;Premedicated before session;Repositioned    Home Living Family/patient expects to be discharged to:: Other (Comment)(LTAC or SNF) Living Arrangements: Spouse/significant other;Other relatives(sister  prior to these present events)   Type of Home: House       Home Layout: One level        Prior Function Level of Independence: Needs assistance   Gait / Transfers Assistance Needed: pt stated she used a RW and was assisted by family           Hand Dominance        Extremity/Trunk Assessment   Upper Extremity Assessment Upper Extremity Assessment: Generalized weakness    Lower Extremity Assessment Lower Extremity Assessment: Generalized weakness(bil knee/hip ROM restrictions and very stiff.)    Cervical / Trunk Assessment Cervical / Trunk Assessment: Kyphotic  Communication   Communication: Tracheostomy  Cognition Arousal/Alertness: Awake/alert Behavior During Therapy: Flat affect;Anxious Overall Cognitive Status: Difficult to assess  General Comments: pt was slow to answer environment questions,  not confident in her answers.  Slow and inconsistent following direction.      General Comments General comments (skin integrity, edema, etc.): vss sats on 28% TC mainitained at mid 90's throughout sitting activity.    Exercises Other Exercises Other Exercises: attempted to range hips/knee bil without complete success due  to pt cocontracting and pain.   Assessment/Plan    PT Assessment Patient needs continued PT services  PT Problem List Decreased strength;Decreased range of motion;Decreased activity tolerance;Decreased balance;Decreased mobility;Decreased knowledge of use of DME;Cardiopulmonary status limiting activity;Pain       PT Treatment Interventions DME instruction;Gait training;Functional mobility training;Therapeutic activities;Therapeutic exercise;Balance training;Patient/family education    PT Goals (Current goals can be found in the Care Plan section)  Acute Rehab PT Goals PT Goal Formulation: Patient unable to participate in goal setting Time For Goal Achievement: 01/30/20 Potential to Achieve Goals: Fair    Frequency Min 3X/week   Barriers to discharge        Co-evaluation               AM-PAC PT "6 Clicks" Mobility  Outcome Measure Help needed turning from your back to your side while in a flat bed without using bedrails?: Total Help needed moving from lying on your back to sitting on the side of a flat bed without using bedrails?: Total Help needed moving to and from a bed to a chair (including a wheelchair)?: Total Help needed standing up from a chair using your arms (e.g., wheelchair or bedside chair)?: Total Help needed to walk in hospital room?: Total Help needed climbing 3-5 steps with a railing? : Total 6 Click Score: 6    End of Session   Activity Tolerance: Other (comment)(pt limited mostly by anxiety.) Patient left: in bed;with call bell/phone within reach;with bed alarm set;with SCD's reapplied Nurse Communication: Mobility status;Need for lift equipment PT Visit Diagnosis: Other abnormalities of gait and mobility (R26.89);Muscle weakness (generalized) (M62.81);Difficulty in walking, not elsewhere classified (R26.2)    Time: 3267-1245 PT Time Calculation (min) (ACUTE ONLY): 32 min   Charges:   PT Evaluation $PT Eval High Complexity: 1 High PT  Treatments $Therapeutic Activity: 8-22 mins        01/16/2020  Ginger Carne., PT Acute Rehabilitation Services [phone]  (pager) [phone]  (office)  Tessie Fass [place] 01/16/2020, 12:22 PM

## 2020-01-16 NOTE — Progress Notes (Signed)
Initial Nutrition Assessment  DOCUMENTATION CODES:   Morbid obesity  INTERVENTION:   Change pt to Osmolite 1.5 (960 ml/day) via PEG 60 ml Prostat BID  Provides: 1840 kcal, 120 grams protein, and 731 ml free water.   NUTRITION DIAGNOSIS:   Inadequate oral intake related to inability to eat as evidenced by NPO status. Ongoing  GOAL:   Patient will meet greater than or equal to 90% of their needs Meeting with TF.   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    Pt discussed during ICU rounds and with RN.  Pt tolerating TC. Per MD may be able to transfer to [facility] if can tolerate iHD.  Per Renal plan for iHD tomorrow.  Spoke with SLP, too much secretions for passy muir valve today but is following. Swallow history unclear.   3/7 CRRT started 3/11 CRRT stopped  Medications reviewed and include: Nabicarb 650 mg BID Labs reviewed: BUN/Cr: 35/.91  UOP: 30 ml CRRT: 1970  TF:  Vital 1.2 @ 45 ml/hr and 60 ml Prostat BID Provides: 1696 kcal, 141 grams protein  Diet Order:    Diet Order            Diet NPO time specified  Diet effective now              EDUCATION NEEDS:   No education needs have been identified at this time  Skin:  Skin Assessment: (weeping arms)  Last BM:  3/10 small  Height:   Ht Readings from Last 1 Encounters:  01/05/20 5\' 4"  (1.626 m)    Weight:   Wt Readings from Last 1 Encounters:  01/16/20 94.4 kg    Ideal Body Weight:  54.5  kg  BMI:  Body mass index is 35.72 kg/m.  Estimated Nutritional Needs:   Kcal:  1700-1900  Protein:  110-136 grams  Fluid:  >1.5 L/day  Lockie Pares., RD, LDN, CNSC See AMiON for contact information

## 2020-01-16 NOTE — Progress Notes (Signed)
NAME:  Bailey Cox, MRN:  [id number], DOB:  1946/01/19, LOS: 8 ADMISSION DATE:  01/05/2020, CONSULTATION DATE:  01/06/20 REFERRING MD:  Tegeler EDP, CHIEF COMPLAINT:   Shock  Brief History   74 year old female admitted 3/1 from [facility] where she had just arrived after long admission to [facility] where she suffered PEA arrest during endoscopy and ended up requiring tracheostomy. Presents to [facility] ED with widespread peripheral edema and acidosis.   History of present illness   74 year old female with PMH as below, which is significant for long admission in the [facility] system from 10/2019 all the way through 01/05/2020.  She was originally hospitalized in September 2020 with acute cholecystitis and underwent a cholecystectomy.  She was then sent to SNF for short duration but had return to [facility] to have a bile duct stent placed ultimately returning to SNF.  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  While hospitalized she developed fluid overload and was ultimately transferred to [facility] for dialysis.  Her course after that point become somewhat unclear, but as described by her family she was on and off the ventilator for several procedures and was eventually left intubated.  One of these procedures was an endoscopy during which she unfortunately suffered a cardiac arrest.  Details of the arrest are unclear.  She remained on the ventilator and ultimately underwent tracheostomy.  Her hospital course was also complicated by MRSA pneumonia.  She was ultimately able to come off of hemodialysis and was treated intermittently with diuresis.  It sounds like she bounced in and out of the ICU a few times with volume overload.  On the tail end of her admission she still struggled with kidney disease and was being treated for a urinary tract infection with cefepime and vancomycin.  Plans were to stop on 3/2.  She was discharged from the [facility] system on 3/1 to  [facility] in [place].  Upon arrival to [facility] they deemed her too sick for admission and transferred her to [facility] emergency department with complaints of volume overload and acidosis.   Past Medical History   has a past medical history of Chronic respiratory failure ([place]), CKD (chronic kidney disease), DM (diabetes mellitus) ([place]), GERD (gastroesophageal reflux disease), HTN (hypertension), Ogilvie syndrome, Tracheostomy status ([place]), and Vascular dementia ([place]).  Significant Hospital Events   07/2019 admit to [facility] for cholecystitis  09/2019 tx to [facility] for renal failure/volume overload. HD started.  10/2019 PEA arrest during EGD . Intubated 11/2019 Trach  Consults:  Nephrology  WOC IR  Procedures:  PICC pre admit > Trach pre admit > 3/5 IJ vas cath->  Significant Diagnostic Tests:  Echo 10/24/2019 > LVEF 55%, Grade 1 DD. RV systolic function normal.  Echo 3/2: LVEF 65-70%, grade II dysfunction. RVSP  Micro Data:  Blood 3/1 >> ng Urine 3/1 >> ng resp 3/4 >> pseudomonas   Antimicrobials:  Meropenem 2/21 >> off  Cefepime Pre hosp (for tracheitis )  >> 3/1-> 3/7 Vancomycin pre hosp >> 3/2   Interim history/subjective:  Tolerated CRRT yesterday. Awake, tries to answer questions. Follows simple commands.  Objective   Blood pressure (!) 141/80, pulse 100, temperature 98.8 F (37.1 C), resp. rate (!) 32, height 5\' 4"  (1.626 m), weight 94.4 kg, SpO2 91 %.    FiO2 (%):  [28 %-35 %] 28 %   Intake/Output Summary (Last 24 hours) at 01/16/2020 1101 Last data filed at  01/16/2020 1005 Gross per 24 hour  Intake 1422.13 ml  Output 1252 ml  Net 170.13 ml   Filed Weights   01/14/20 0500 01/15/20 0500 01/16/20 0500  Weight: 99.9 kg 96.5 kg 94.4 kg   Physical Exam: General: Elderly, frail-appearing, no acute distress HENT: [place], AT, OP clear, MMM Neck: Trach in place, c/d/i Eyes: EOMI, no scleral icterus Respiratory: Clear to auscultation bilaterally.  No  crackles, wheezing or rales Cardiovascular: RRR, -M/R/G, no JVD GI: BS+, soft, nontender Extremities:1+ pitting edema in lower extremities,-tenderness Neuro: Awake, follows commands, moves extremities x 4   CBC Latest Ref Rng & Units 01/16/2020 01/15/2020 01/14/2020  WBC 4.0 - 10.5 K/uL 7.2 7.8 6.8  Hemoglobin 12.0 - 15.0 g/dL 7.6(L) 7.9(L) 8.3(L)  Hematocrit 36.0 - 46.0 % 25.7(L) 26.1(L) 27.8(L)  Platelets 150 - 400 K/uL 67(L) 55(L) 54(L)   BMP Latest Ref Rng & Units 01/16/2020 01/15/2020 01/15/2020  Glucose 70 - 99 mg/dL 134(H) 145(H) 147(H)  BUN 8 - 23 mg/dL 35(H) 23 23  Creatinine 0.44 - 1.00 mg/dL 0.91 0.70 0.69  Sodium 135 - 145 mmol/L 136 137 136  Potassium 3.5 - 5.1 mmol/L 4.9 4.3 4.3  Chloride 98 - 111 mmol/L 100 102 102  CO2 22 - 32 mmol/L 28 28 27   Calcium 8.9 - 10.3 mg/dL 8.7(L) 8.2(L) 8.4(L)    Resolved problems:  Metabolic acidosis, Septic shock, Elevated lipase  Assessment & Plan:   Acute on chronic hypoxic respiratory failure secondary to Pseudomonal HCAP s/p antibiotics x 7d Tracheostomy status. Off vent since 3/08 Continue trach care, bronchial hygiene.   Speech consulted Start PT, mobilize  AKI from ATN 2nd to sepsis. CKD 4. CRRT per Nephrology. 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 SSI coverage  Anemia of critical illness and chronic disease. Thrombocytopenia. Follow CBC, transfuse for hemoglobin less than 7  Disposition Will discuss with case manager for placement if patient able to tolerate iHD May need out-of-state placement due to trach and dialysis  Anxiety Continue home Paxil at half dose and Xanax as needed  Best practice:  Diet: NPO, TFs DVT prophylaxis: Holding subQ heparin for Plt ~50s GI prophylaxis: protonix Mobility: BR Code Status: FULL Disposition: ICU  The patient is critically ill with multiple organ systems failure and requires high complexity decision making for assessment and support,  frequent evaluation and titration of therapies, application of advanced monitoring technologies and extensive interpretation of multiple databases.   Critical Care Time devoted to patient care services described in this note is 32 Minutes.   Rodman Pickle, M.D. [facility] Pulmonary/Critical Care Medicine 01/16/2020 11:03 AM   Please see Amion for pager number to reach on-call Pulmonary and Critical Care Team.

## 2020-01-16 NOTE — Evaluation (Signed)
Passy-Muir Speaking Valve - Evaluation Patient Details  Name: Bailey Cox MRN: [id number] Date of Birth: May 23, 1946  Today's Date: 01/16/2020 Time: 9030-0923 SLP Time Calculation (min) (ACUTE ONLY): 14 min  Past Medical History:  Past Medical History:  Diagnosis Date  . Chronic respiratory failure ([place])   . CKD (chronic kidney disease)   . DM (diabetes mellitus) ([place])   . GERD (gastroesophageal reflux disease)   . HTN (hypertension)   . Ogilvie syndrome   . Tracheostomy status ([place])    2020  . Vascular dementia [facility])    Past Surgical History:  Past Surgical History:  Procedure Laterality Date  . IR FLUORO GUIDE CV LINE RIGHT  01/10/2020  . IR US GUIDE VASC ACCESS RIGHT  01/10/2020   HPI:  74 y.o. female who had a prolonged 28 day hospitalization at [facility] with a history of HTN COPD DM hypothyroidism Ogilvie's syndrome. PEA arrest during EGD in early December with duodenal ulcer noted on EGD. Intermittent hyperkalemia treatet medically w/ Kayexalate and now recently w/ worsening renal function over the past few days prior to admission to [facility].   Assessment / Plan / Recommendation Clinical Impression   Pt encountered in bed, as RT had just finished suctioning. Pt's cuff was also deflated prior to arrival. Pt was able to phonate with finger occlusion, noted with very strong vocal quality. With PMV placement, pt's RR ranged from 17-23 and SpO2 85-93% (question consistent reliability), and HR remained stable t/o. Pt's vocal quality remained strong, but was noted to be wet at times suspecting pt may have upper airway secretions. When cued to produce cough, pt stated she could not d/t pain in her chest. She eventually coughed with Mod cues, but was unable to expectorate secretions orally. PMV was placed 3 times, noted with increased WOB and an increase in back pressure after each removal. Recommend pt wear PMV only with SLP at this time. Would consider swallow evaluation to be  comepleted as appropriate. Will continue to follow acutely.    SLP Visit Diagnosis: Aphonia (R49.1)    SLP Assessment  Patient needs continued Speech Lanaguage Pathology Services    Follow Up Recommendations  24 hour supervision/assistance;Skilled Nursing facility    Frequency and Duration min 2x/week  2 weeks    PMSV Trial PMSV was placed for: 3 minutes Able to redirect subglottic air through upper airway: Yes Able to Attain Phonation: Yes Voice Quality: Normal;Wet Able to Expectorate Secretions: No attempts Breath Support for Phonation: Adequate Intelligibility: Intelligibility reduced Word: 75-100% accurate Phrase: 75-100% accurate Sentence: 75-100% accurate Conversation: 75-100% accurate Respirations During Trial: 24 SpO2 During Trial: 91 % Pulse During Trial: 91 Behavior: Alert;Cooperative;Responsive to questions   Tracheostomy Tube       Vent Dependency  FiO2 (%): 28 %    Cuff Deflation Trial  GO   Aline August, Student SLP Office: [phone]  Tolerated Cuff Deflation: Yes Length of Time for Cuff Deflation Trial: 14 minutes Behavior: Alert;Cooperative        01/16/2020, 1:13 PM

## 2020-01-17 DIAGNOSIS — N189 Chronic kidney disease, unspecified: Secondary | ICD-10-CM | POA: Diagnosis not present

## 2020-01-17 DIAGNOSIS — Z930 Tracheostomy status: Secondary | ICD-10-CM | POA: Diagnosis not present

## 2020-01-17 DIAGNOSIS — J9611 Chronic respiratory failure with hypoxia: Secondary | ICD-10-CM | POA: Diagnosis not present

## 2020-01-17 DIAGNOSIS — N179 Acute kidney failure, unspecified: Secondary | ICD-10-CM | POA: Diagnosis not present

## 2020-01-17 LAB — RENAL FUNCTION PANEL
Albumin: 2.5 g/dL — ABNORMAL LOW (ref 3.5–5.0)
Albumin: 2.4 g/dL — ABNORMAL LOW (ref 3.5–5.0)
Anion gap: 9 (ref 5–15)
Anion gap: 10 (ref 5–15)
BUN: 60 mg/dL — ABNORMAL HIGH (ref 8–23)
BUN: 27 mg/dL — ABNORMAL HIGH (ref 8–23)
CO2: 28 mmol/L (ref 22–32)
CO2: 27 mmol/L (ref 22–32)
Calcium: 8.8 mg/dL — ABNORMAL LOW (ref 8.9–10.3)
Calcium: 8.2 mg/dL — ABNORMAL LOW (ref 8.9–10.3)
Chloride: 99 mmol/L (ref 98–111)
Chloride: 99 mmol/L (ref 98–111)
Creatinine, Ser: 1.69 mg/dL — ABNORMAL HIGH (ref 0.44–1.00)
Creatinine, Ser: 1.12 mg/dL — ABNORMAL HIGH (ref 0.44–1.00)
GFR calc Af Amer: 35 mL/min — ABNORMAL LOW (ref >60)
GFR calc Af Amer: 57 mL/min — ABNORMAL LOW (ref >60)
GFR calc non Af Amer: 30 mL/min — ABNORMAL LOW (ref >60)
GFR calc non Af Amer: 49 mL/min — ABNORMAL LOW (ref >60)
Glucose, Bld: 115 mg/dL — ABNORMAL HIGH (ref 70–99)
Glucose, Bld: 154 mg/dL — ABNORMAL HIGH (ref 70–99)
Phosphorus: 4 mg/dL (ref 2.5–4.6)
Phosphorus: 2.5 mg/dL (ref 2.5–4.6)
Potassium: 5.2 mmol/L — ABNORMAL HIGH (ref 3.5–5.1)
Potassium: 3.8 mmol/L (ref 3.5–5.1)
Sodium: 136 mmol/L (ref 135–145)
Sodium: 136 mmol/L (ref 135–145)

## 2020-01-17 LAB — CBC
HCT: 24.4 % — ABNORMAL LOW (ref 36.0–46.0)
Hemoglobin: 7.3 g/dL — ABNORMAL LOW (ref 12.0–15.0)
MCH: 29.4 pg (ref 26.0–34.0)
MCHC: 29.9 g/dL — ABNORMAL LOW (ref 30.0–36.0)
MCV: 98.4 fL (ref 80.0–100.0)
Platelets: 77 10*3/uL — ABNORMAL LOW (ref 150–400)
RBC: 2.48 MIL/uL — ABNORMAL LOW (ref 3.87–5.11)
RDW: 18.6 % — ABNORMAL HIGH (ref 11.5–15.5)
WBC: 8.4 10*3/uL (ref 4.0–10.5)
nRBC: 0 % (ref 0.0–0.2)

## 2020-01-17 LAB — GLUCOSE, CAPILLARY
Glucose-Capillary: 112 mg/dL — ABNORMAL HIGH (ref 70–99)
Glucose-Capillary: 113 mg/dL — ABNORMAL HIGH (ref 70–99)
Glucose-Capillary: 134 mg/dL — ABNORMAL HIGH (ref 70–99)
Glucose-Capillary: 141 mg/dL — ABNORMAL HIGH (ref 70–99)
Glucose-Capillary: 119 mg/dL — ABNORMAL HIGH (ref 70–99)
Glucose-Capillary: 156 mg/dL — ABNORMAL HIGH (ref 70–99)

## 2020-01-17 LAB — MAGNESIUM: Magnesium: 2.7 mg/dL — ABNORMAL HIGH (ref 1.7–2.4)

## 2020-01-17 NOTE — Progress Notes (Signed)
NAME:  Bailey Cox, MRN:  [id number], DOB:  1945/12/10, LOS: 12 ADMISSION DATE:  01/05/2020, CONSULTATION DATE:  01/06/20 REFERRING MD:  Tegeler EDP, CHIEF COMPLAINT:   Shock  Brief History   74 year old female admitted 3/1 from [facility] where she had just arrived after long admission to [facility] where she suffered PEA arrest during endoscopy and ended up requiring tracheostomy. Presents to [facility] ED with widespread peripheral edema and acidosis.   History of present illness   74 year old female with PMH as below, which is significant for long admission in the [facility] system from 10/2019 all the way through 01/05/2020.  She was originally hospitalized in September 2020 with acute cholecystitis and underwent a cholecystectomy.  She was then sent to SNF for short duration but had return to [facility] to have a bile duct stent placed ultimately returning to SNF.  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  While hospitalized she developed fluid overload and was ultimately transferred to [facility] for dialysis.  Her course after that point become somewhat unclear, but as described by her family she was on and off the ventilator for several procedures and was eventually left intubated.  One of these procedures was an endoscopy during which she unfortunately suffered a cardiac arrest.  Details of the arrest are unclear.  She remained on the ventilator and ultimately underwent tracheostomy.  Her hospital course was also complicated by MRSA pneumonia.  She was ultimately able to come off of hemodialysis and was treated intermittently with diuresis.  It sounds like she bounced in and out of the ICU a few times with volume overload.  On the tail end of her admission she still struggled with kidney disease and was being treated for a urinary tract infection with cefepime and vancomycin.  Plans were to stop on 3/2.  She was discharged from the [facility] system on 3/1 to  [facility] in [place].  Upon arrival to [facility] they deemed her too sick for admission and transferred her to [facility] emergency department with complaints of volume overload and acidosis.   Past Medical History   has a past medical history of Chronic respiratory failure ([place]), CKD (chronic kidney disease), DM (diabetes mellitus) ([place]), GERD (gastroesophageal reflux disease), HTN (hypertension), Ogilvie syndrome, Tracheostomy status ([place]), and Vascular dementia ([place]).  Significant Hospital Events   07/2019 admit to [facility] for cholecystitis  09/2019 tx to [facility] for renal failure/volume overload. HD started.  10/2019 PEA arrest during EGD . Intubated 11/2019 Trach  Consults:  Nephrology  WOC IR  Procedures:  PICC pre admit > Trach pre admit > 3/5 IJ vas cath->  Significant Diagnostic Tests:  Echo 10/24/2019 > LVEF 55%, Grade 1 DD. RV systolic function normal.  Echo 3/2: LVEF 65-70%, grade II dysfunction. RVSP  Micro Data:  Blood 3/1 >> ng Urine 3/1 >> ng resp 3/4 >> pseudomonas   Antimicrobials:  Meropenem 2/21 >> off  Cefepime Pre hosp (for tracheitis )  >> 3/1-> 3/7 Vancomycin pre hosp >> 3/2   Interim history/subjective:  On iHD today. Nods heads to questions. Copious secretions requiring suctioning  Objective   Blood pressure 135/60, pulse 87, temperature 98.3 F (36.8 C), temperature source Axillary, resp. rate (!) 23, height 5\' 4"  (1.626 m), weight 94.4 kg, SpO2 100 %.    FiO2 (%):  [28 %-60 %] 35 %   Intake/Output Summary (Last 24 hours) at 01/17/2020 0729 Last data  filed at 01/17/2020 0600 Gross per 24 hour  Intake 985 ml  Output 31 ml  Net 954 ml   Filed Weights   01/15/20 0500 01/16/20 0500 01/17/20 0500  Weight: 96.5 kg 94.4 kg 94.4 kg   Physical Exam: General: Elderly, frail-appearing, no acute distress HENT: , AT, OP clear, MMM Neck: Trach in place, c/d/i Eyes: EOMI, no scleral icterus Respiratory: Anterior coarse breath  sounds Cardiovascular: RRR, -M/R/G, no JVD GI: BS+, soft, nontender Extremities:-Edema,-tenderness Neuro: Awake, follows commands, moves extremities x 4  CBC Latest Ref Rng & Units 01/17/2020 01/16/2020 01/15/2020  WBC 4.0 - 10.5 K/uL 8.4 7.2 7.8  Hemoglobin 12.0 - 15.0 g/dL 7.3(L) 7.6(L) 7.9(L)  Hematocrit 36.0 - 46.0 % 24.4(L) 25.7(L) 26.1(L)  Platelets 150 - 400 K/uL 77(L) 67(L) 55(L)   BMP Latest Ref Rng & Units 01/17/2020 01/16/2020 01/16/2020  Glucose 70 - 99 mg/dL 115(H) 131(H) 134(H)  BUN 8 - 23 mg/dL 60(H) 48(H) 35(H)  Creatinine 0.44 - 1.00 mg/dL 1.69(H) 1.40(H) 0.91  Sodium 135 - 145 mmol/L 136 135 136  Potassium 3.5 - 5.1 mmol/L 5.2(H) 4.8 4.9  Chloride 98 - 111 mmol/L 99 100 100  CO2 22 - 32 mmol/L 28 26 28   Calcium 8.9 - 10.3 mg/dL 8.8(L) 8.7(L) 8.7(L)    Resolved problems:  Metabolic acidosis, Septic shock, Elevated lipase Acute on chronic hypoxic respiratory failure secondary to Pseudomonal HCAP s/p antibiotics x 7d Assessment & Plan:   Chronic tracheostomy status, placed in 11/2018 Off vent since 3/08 Continue trach care, bronchial hygiene.   Speech consulted Continue PT, mobilize  AKI from ATN 2nd to sepsis. CKD 4. iHD per Nephrology today. 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 SSI coverage  Anemia of critical illness and chronic disease. Thrombocytopenia. Follow CBC, transfuse for hemoglobin less than 7  Disposition Will discuss with case manager for placement if patient able to tolerate iHD May need out-of-state placement due to trach and dialysis  Anxiety Continue home Paxil at half dose and Xanax as needed  Best practice:  Diet: NPO, TFs DVT prophylaxis: Holding subQ heparin for Plt ~50s GI prophylaxis: protonix Mobility: BR Code Status: FULL Disposition: If tolerates iHD, plan to transfer out of ICU  The patient is critically ill with multiple organ systems failure and requires high  complexity decision making for assessment and support, frequent evaluation and titration of therapies, application of advanced monitoring technologies and extensive interpretation of multiple databases.   Critical Care Time devoted to patient care services described in this note is 31 Minutes.   Rodman Pickle, M.D. [facility] Pulmonary/Critical Care Medicine 01/17/2020 7:42 AM   Please see Amion for pager number to reach on-call Pulmonary and Critical Care Team.

## 2020-01-17 NOTE — Plan of Care (Signed)
  Problem: Nutrition: Goal: Adequate nutrition will be maintained Outcome: Progressing   

## 2020-01-17 NOTE — Procedures (Signed)
Patient was seen on dialysis and the procedure was supervised.  BFR 400  Via TDC BP is  121/57.   Patient appears to be tolerating treatment well- attempting 3 plus liters off  Bailey Cox 01/17/2020

## 2020-01-17 NOTE — Progress Notes (Signed)
[facility] Progress Note    74 y.o.femalewho had a prolonged 81 day hospitalization at [facility] with  HTN COPD DM hypothyroidism Ogilvie's syndrome. PEA arrest during EGD in early December.Intermittent hyperkalemia treatet medically and with HD at OSH and now recently w/ worsening renal function over the past few days prior to admission to [facility]. She actually had a foley placed 12/28/19 when the renal function was worsening with return of purulence - started on Meropenem. Per review of chart the pt did not want RRT but after urging by family agreed to it.   Assessment/ Plan:   1. AKI on CKD with most likely cause of the AKI being ATN from sepsis.  - She is not a good candidate for long term dialysis due to trach- would need to go to  [facility].  - [name] [phone] and [name] [phone]; both have POA.  Dr. Augustin Coupe spoke with Katharine Look 3/4 and they wanted RRT; actually was on dialysis off and on while at [facility].  They dont appear to be realistic regarding pt recovery  Attempted iHD 3/5  -> cath converted by VIR to TC- then started CRRT on 3/7-3/11 for clearance and also for the anasarca/ volume management-   stopped CRRT on 3/11.  Suspect will need ongoing RRT in the form of IHD.  I am just concerned about the endpoint-  With trach if she remains HD dep the only option is LTACH assuming that she will tolerate regular HD.   Nursing indicates that family is set on aggressive care.  I will talk to them at some point-  Pt seems to understand on some level. Planning for IHD today  2. Sepsis -  No longer on pressors -  broad spectrum abx(cefepime and vanc) 3. ?urosepsis -  urine is clearer clinically 4. H/o PEA arrest 5. MRSA PNA on vanc/cefepime  6. Respiratory failure with  trach 7. Htn.vol-  Massive volume overload-  15 liters negative with CRRT-  Will cont UF as able on IHD 8. Anemia-   iron stores OK- giving ESA - transfuse  PRN 9. elytes-  Phos is on the low side but will rise after we stop CRRT and K  OK   Subjective:   CRRT stopped 3/11- really no UOP and crt up again.  Seen on IHD today - upset b/c has wrist restraints    Objective:   BP (!) 126/57   Pulse 85   Temp (!) 97.4 F (36.3 C) (Axillary)   Resp (!) 24   Ht '5\' 4"'$  (1.626 m)   Wt 94.4 kg   SpO2 100%   BMI 35.72 kg/m   Intake/Output Summary (Last 24 hours) at 01/17/2020 1002 Last data filed at 01/17/2020 0600 Gross per 24 hour  Intake 850 ml  Output 11 ml  Net 839 ml   Weight change: 0 kg  Physical Exam: GEN: NAD,trached--- very alert ! Upset at present HEENT: No conjunctival pallor, EOMI NECK: Supple, no thyromegaly LUNGS: CTA B/L  CV: RRR, No M/R/G ABD: SNDNT +BS  EXT:2+ lowerextremity edema diffusely- still  ACCESS: RIJ TC  Imaging: No results found.  Labs: BMET Recent Labs  Lab 01/14/20 0502 01/14/20 1610 01/15/20 0435 01/15/20 1703 01/16/20 0521 01/16/20 1700 01/17/20 0424  NA 138 136 136 137 136 135 136  K 4.2 4.4 4.3 4.3 4.9 4.8 5.2*  CL 104 98 102 102 100 100 99  CO2 '27 27 27 28 28 26 '$ 28  GLUCOSE 136* 268* 147* 145* 134* 131* 115*  BUN 31* 26* 23 23 35* 48* 60*  CREATININE 0.86 0.84 0.69 0.70 0.91 1.40* 1.69*  CALCIUM 8.2* 8.1* 8.4* 8.2* 8.7* 8.7* 8.8*  PHOS 2.2* 10.3* 2.1* 2.9 3.1 3.5 4.0   CBC Recent Labs  Lab 01/14/20 0502 01/15/20 0435 01/16/20 0521 01/17/20 0424  WBC 6.8 7.8 7.2 8.4  HGB 8.3* 7.9* 7.6* 7.3*  HCT 27.8* 26.1* 25.7* 24.4*  MCV 95.5 96.3 98.5 98.4  PLT 54* 55* 67* 77*    Medications:    . chlorhexidine gluconate (MEDLINE KIT)  15 mL Mouth Rinse BID  . Chlorhexidine Gluconate Cloth  6 each Topical Daily  . Chlorhexidine Gluconate Cloth  6 each Topical Q0600  . darbepoetin (ARANESP) injection - NON-DIALYSIS  100 mcg Subcutaneous Q Mon-1800  . feeding supplement (PRO-STAT SUGAR FREE 64)  60 mL Per Tube BID  . insulin aspart  0-15 Units Subcutaneous Q4H  . mouth rinse  15  mL Mouth Rinse 10 times per day  . pantoprazole sodium  40 mg Per Tube Q24H  . PARoxetine  30 mg Per Tube Daily  . sodium bicarbonate  650 mg Per Tube BID  . sodium chloride flush  10-40 mL Intracatheter Q12H  . traZODone  25 mg Per Tube QHS      Bailey Cox  01/17/2020, 10:02 AM

## 2020-01-18 DIAGNOSIS — J9611 Chronic respiratory failure with hypoxia: Secondary | ICD-10-CM | POA: Diagnosis not present

## 2020-01-18 DIAGNOSIS — N189 Chronic kidney disease, unspecified: Secondary | ICD-10-CM | POA: Diagnosis not present

## 2020-01-18 DIAGNOSIS — N179 Acute kidney failure, unspecified: Secondary | ICD-10-CM | POA: Diagnosis not present

## 2020-01-18 LAB — RENAL FUNCTION PANEL
Albumin: 2.3 g/dL — ABNORMAL LOW (ref 3.5–5.0)
Albumin: 2.3 g/dL — ABNORMAL LOW (ref 3.5–5.0)
Anion gap: 9 (ref 5–15)
Anion gap: 7 (ref 5–15)
BUN: 41 mg/dL — ABNORMAL HIGH (ref 8–23)
BUN: 55 mg/dL — ABNORMAL HIGH (ref 8–23)
CO2: 30 mmol/L (ref 22–32)
CO2: 30 mmol/L (ref 22–32)
Calcium: 8.5 mg/dL — ABNORMAL LOW (ref 8.9–10.3)
Calcium: 8.2 mg/dL — ABNORMAL LOW (ref 8.9–10.3)
Chloride: 99 mmol/L (ref 98–111)
Chloride: 98 mmol/L (ref 98–111)
Creatinine, Ser: 1.22 mg/dL — ABNORMAL HIGH (ref 0.44–1.00)
Creatinine, Ser: 1.76 mg/dL — ABNORMAL HIGH (ref 0.44–1.00)
GFR calc Af Amer: 52 mL/min — ABNORMAL LOW (ref >60)
GFR calc Af Amer: 33 mL/min — ABNORMAL LOW (ref >60)
GFR calc non Af Amer: 45 mL/min — ABNORMAL LOW (ref >60)
GFR calc non Af Amer: 29 mL/min — ABNORMAL LOW (ref >60)
Glucose, Bld: 106 mg/dL — ABNORMAL HIGH (ref 70–99)
Glucose, Bld: 158 mg/dL — ABNORMAL HIGH (ref 70–99)
Phosphorus: 3.9 mg/dL (ref 2.5–4.6)
Phosphorus: 4.9 mg/dL — ABNORMAL HIGH (ref 2.5–4.6)
Potassium: 4.4 mmol/L (ref 3.5–5.1)
Potassium: 4.7 mmol/L (ref 3.5–5.1)
Sodium: 138 mmol/L (ref 135–145)
Sodium: 135 mmol/L (ref 135–145)

## 2020-01-18 LAB — GLUCOSE, CAPILLARY
Glucose-Capillary: 103 mg/dL — ABNORMAL HIGH (ref 70–99)
Glucose-Capillary: 117 mg/dL — ABNORMAL HIGH (ref 70–99)
Glucose-Capillary: 140 mg/dL — ABNORMAL HIGH (ref 70–99)
Glucose-Capillary: 150 mg/dL — ABNORMAL HIGH (ref 70–99)
Glucose-Capillary: 153 mg/dL — ABNORMAL HIGH (ref 70–99)
Glucose-Capillary: 161 mg/dL — ABNORMAL HIGH (ref 70–99)

## 2020-01-18 LAB — CBC
HCT: 24 % — ABNORMAL LOW (ref 36.0–46.0)
Hemoglobin: 7 g/dL — ABNORMAL LOW (ref 12.0–15.0)
MCH: 28.6 pg (ref 26.0–34.0)
MCHC: 29.2 g/dL — ABNORMAL LOW (ref 30.0–36.0)
MCV: 98 fL (ref 80.0–100.0)
Platelets: 75 10*3/uL — ABNORMAL LOW (ref 150–400)
RBC: 2.45 MIL/uL — ABNORMAL LOW (ref 3.87–5.11)
RDW: 18.5 % — ABNORMAL HIGH (ref 11.5–15.5)
WBC: 5.5 10*3/uL (ref 4.0–10.5)
nRBC: 0.4 % — ABNORMAL HIGH (ref 0.0–0.2)

## 2020-01-18 LAB — MAGNESIUM: Magnesium: 2.4 mg/dL (ref 1.7–2.4)

## 2020-01-18 MED ORDER — HEPARIN SODIUM (PORCINE) 5000 UNIT/ML IJ SOLN
5000.0000 [IU] | Freq: Three times a day (TID) | INTRAMUSCULAR | Status: DC
  Administered 2020-01-18 – 2020-02-16 (×85): 5000 [IU] via SUBCUTANEOUS
  Filled 2020-01-18 (×85): qty 1

## 2020-01-18 NOTE — Progress Notes (Signed)
[facility] Progress Note    74 y.o.femalewho had Bailey prolonged 81 day hospitalization at [facility] with  HTN COPD DM hypothyroidism Ogilvie's syndrome. PEA arrest during EGD in early December.Intermittent hyperkalemia treatet medically and with HD at OSH and now recently w/ worsening renal function over the past few days prior to admission to [facility]. She actually had Bailey foley placed 12/28/19 when the renal function was worsening with return of purulence - started on Meropenem. Per review of chart the pt did not want RRT but after urging by family agreed to it.   Assessment/ Plan:   1. AKI on CKD with most likely cause of the AKI being ATN from sepsis.  - She is not Bailey great candidate for long term dialysis due to trach and unclear how much overall recovery potential after 4 month hosp - Granddaughter is Bailey Cox [phone] and daughter is Bailey Cox [phone]; both have POA.  Dr. Augustin Coupe spoke with family and they wanted RRT; actually was on dialysis off and on while at [facility] and due to go to [facility].  As Bailey side note they dont appear to be realistic regarding pt recovery  Attempted iHD 3/5  -> cath converted by VIR to TC- then started CRRT on 3/7-3/11 for clearance and also for the anasarca/ volume management-   stopped CRRT on 3/11.  Suspect will need ongoing RRT in the form of IHD.  I am just concerned about the endpoint-  With trach if she remains HD dep the only option is LTACH-  She was initially sent to [facility] so that does not appear to be an issue.   Nursing indicates that family is set on aggressive care- have refused DNR.  Pt seems to understand on some level. Planning for IHD to keep on TTS  schedule while here via [place]  2. Sepsis -  No longer on pressors -  broad spectrum abx(cefepime and vanc) 3. ?urosepsis -  urine is clearer clinically 4. H/o PEA arrest 5. MRSA PNA on vanc/cefepime  6. Respiratory failure with  trach 7. Htn.vol-  Massive volume  overload-  15 liters negative with CRRT-  Will cont UF as able on IHD- tolerated 3500 UF on Sat 8. Anemia-   iron stores OK- giving ESA - transfuse PRN 9. elytes-  Phos and K  OK   Subjective:   IHD yest-  Removed 3500 tolerated well BP did not drop during but has been Bailey little soft after.  She is somnolent but arousable- does not indicate any complaints    Objective:   BP (!) 100/46   Pulse (!) 59   Temp (!) 97.4 F (36.3 C) (Axillary)   Resp 12   Ht '5\' 4"'$  (1.626 m)   Wt 92.1 kg   SpO2 99%   BMI 34.85 kg/m   Intake/Output Summary (Last 24 hours) at 01/18/2020 1004 Last data filed at 01/18/2020 0900 Gross per 24 hour  Intake 840 ml  Output 3539 ml  Net -2699 ml   Weight change: 0 kg  Physical Exam: GEN: NAD,trached--- very alert usually-  Come somnolence this AM HEENT: No conjunctival pallor, EOMI NECK: Supple, no thyromegaly LUNGS: CTA B/L  CV: RRR, No M/R/G ABD: SNDNT +BS  EXT:2+ lowerextremity edema diffusely- still  ACCESS: RIJ TC  Imaging: No results found.  Labs: BMET Recent Labs  Lab 01/15/20 0435 01/15/20 1703 01/16/20 0521 01/16/20 1700 01/17/20 0424 01/17/20 1615 01/18/20 0542  NA 136 137 136 135 136 136  138  K 4.3 4.3 4.9 4.8 5.2* 3.8 4.4  CL 102 102 100 100 99 99 99  CO2 '27 28 28 26 28 27 30  '$ GLUCOSE 147* 145* 134* 131* 115* 154* 106*  BUN 23 23 35* 48* 60* 27* 41*  CREATININE 0.69 0.70 0.91 1.40* 1.69* 1.12* 1.22*  CALCIUM 8.4* 8.2* 8.7* 8.7* 8.8* 8.2* 8.5*  PHOS 2.1* 2.9 3.1 3.5 4.0 2.5 3.9   CBC Recent Labs  Lab 01/15/20 0435 01/16/20 0521 01/17/20 0424 01/18/20 0542  WBC 7.8 7.2 8.4 5.5  HGB 7.9* 7.6* 7.3* 7.0*  HCT 26.1* 25.7* 24.4* 24.0*  MCV 96.3 98.5 98.4 98.0  PLT 55* 67* 77* 75*    Medications:    . chlorhexidine gluconate (MEDLINE KIT)  15 mL Mouth Rinse BID  . Chlorhexidine Gluconate Cloth  6 each Topical Daily  . Chlorhexidine Gluconate Cloth  6 each Topical Q0600  . darbepoetin (ARANESP) injection -  NON-DIALYSIS  100 mcg Subcutaneous Q Mon-1800  . feeding supplement (PRO-STAT SUGAR FREE 64)  60 mL Per Tube BID  . heparin  5,000 Units Subcutaneous Q8H  . insulin aspart  0-15 Units Subcutaneous Q4H  . mouth rinse  15 mL Mouth Rinse 10 times per day  . pantoprazole sodium  40 mg Per Tube Q24H  . PARoxetine  30 mg Per Tube Daily  . sodium bicarbonate  650 mg Per Tube BID  . sodium chloride flush  10-40 mL Intracatheter Q12H  . traZODone  25 mg Per Tube QHS      Bailey Cox Bailey Cox  01/18/2020, 10:04 AM

## 2020-01-18 NOTE — Progress Notes (Addendum)
NAME:  Bailey Cox, MRN:  [id number], DOB:  06-14-1946, LOS: 87 ADMISSION DATE:  01/05/2020, CONSULTATION DATE:  01/06/20 REFERRING MD:  T[place] EDP, CHIEF COMPLAINT:   Shock  Brief History   74 year old female admitted 3/1 from [place] where she had just arrived after long admission to [facility] where she suffered PEA arrest during endoscopy and ended up requiring tracheostomy. Presents to [facility] ED with widespread peripheral edema and acidosis.   History of present illness   74 year old female with PMH as below, which is significant for long admission in the [facility] system from 10/2019 all the way through 01/05/2020.  She was originally hospitalized in September 2020 with acute cholecystitis and underwent a cholecystectomy.  She was then sent to SNF for short duration but had return to [facility] to have a bile duct stent placed ultimately returning to SNF.  She then returned to [facility] with bowel dilation secondary to Ogilvie's syndrome.  While hospitalized she developed fluid overload and was ultimately transferred to [facility] for dialysis.  Her course after that point become somewhat unclear, but as described by her family she was on and off the ventilator for several procedures and was eventually left intubated.  One of these procedures was an endoscopy during which she unfortunately suffered a cardiac arrest.  Details of the arrest are unclear.  She remained on the ventilator and ultimately underwent tracheostomy.  Her hospital course was also complicated by MRSA pneumonia.  She was ultimately able to come off of hemodialysis and was treated intermittently with diuresis.  It sounds like she bounced in and out of the ICU a few times with volume overload.  On the tail end of her admission she still struggled with kidney disease and was being treated for a urinary tract infection with cefepime and vancomycin.  Plans were to stop on 3/2.  She was discharged from the [facility] system on 3/1 to  [facility] in [place].  Upon arrival to [facility] they deemed her too sick for admission and transferred her to [facility] emergency department with complaints of volume overload and acidosis.   Past Medical History   has a past medical history of Chronic respiratory failure ([place]), CKD (chronic kidney disease), DM (diabetes mellitus) ([place]), GERD (gastroesophageal reflux disease), HTN (hypertension), Ogilvie syndrome, Tracheostomy status ([place]), and Vascular dementia ([place]).  Significant Hospital Events   07/2019 admit to [facility] for cholecystitis  09/2019 tx to [facility] for renal failure/volume overload. HD started.  10/2019 PEA arrest during EGD . Intubated 11/2019 Trach  3/8 Tolerating TCT 3/9-3/12 CRRT 3/14 Tolerating HD. Will transfer to floor  Consults:  Nephrology  [place] [facility]  Procedures:  PICC pre admit > Trach pre admit > 3/5 IJ vas cath->  Significant Diagnostic Tests:  Echo 10/24/2019 > LVEF 55%, Grade 1 DD. RV systolic function normal.  Echo 3/2: LVEF 65-70%, grade II dysfunction. RVSP  Micro Data:  Blood 3/1 >> ng Urine 3/1 >> ng resp 3/4 >> pan-sensitive pseudomonas   Antimicrobials:  Meropenem 2/21 >> off  Cefepime Pre hosp (for tracheitis )  >> 3/1-> 3/7 Vancomycin pre hosp >> 3/2   Interim history/subjective:  Tolerated HD with volume removal yesterday. No complaints this morning. Remains on trach collar.  Objective   Blood pressure (!) 116/53, pulse 70, temperature (!) 97.4 F (36.3 C), temperature source Axillary, resp. rate 12, height 5\' 4"  (1.626 m), weight 92.1 kg, SpO2 97 %.    FiO2 (%):  [  35 %] 35 %   Intake/Output Summary (Last 24 hours) at 01/18/2020 0820 Last data filed at 01/18/2020 0600 Gross per 24 hour  Intake 840 ml  Output 3539 ml  Net -2699 ml   Filed Weights   01/17/20 0720 01/17/20 1144 01/18/20 0500  Weight: 94.4 kg 91 kg 92.1 kg   Physical Exam: General: Elderly, frail-appearing, no acute distress HENT: [place], AT, OP  clear, MMM Neck: Trach in place, c/d/i Eyes: EOMI, no scleral icterus Respiratory: Clear to auscultation bilaterally.  No crackles, wheezing or rales Cardiovascular: RRR, -M/R/G, no JVD GI: BS+, soft, nontender Extremities:-Edema,-tenderness Neuro: Awake, alert, follows commands, moves extremities x 4 GU: Foley in place   CBC Latest Ref Rng & Units 01/18/2020 01/17/2020 01/16/2020  WBC 4.0 - 10.5 K/uL 5.5 8.4 7.2  Hemoglobin 12.0 - 15.0 g/dL 7.0(L) 7.3(L) 7.6(L)  Hematocrit 36.0 - 46.0 % 24.0(L) 24.4(L) 25.7(L)  Platelets 150 - 400 K/uL 75(L) 77(L) 67(L)   BMP Latest Ref Rng & Units 01/18/2020 01/17/2020 01/17/2020  Glucose 70 - 99 mg/dL 106(H) 154(H) 115(H)  BUN 8 - 23 mg/dL 41(H) 27(H) 60(H)  Creatinine 0.44 - 1.00 mg/dL 1.22(H) 1.12(H) 1.69(H)  Sodium 135 - 145 mmol/L 138 136 136  Potassium 3.5 - 5.1 mmol/L 4.4 3.8 5.2(H)  Chloride 98 - 111 mmol/L 99 99 99  CO2 22 - 32 mmol/L 30 27 28   Calcium 8.9 - 10.3 mg/dL 8.5(L) 8.2(L) 8.8(L)    Resolved problems:  Metabolic acidosis, Septic shock, Elevated lipase Acute on chronic hypoxic respiratory failure secondary to Pseudomonal HCAP s/p antibiotics x 7d Assessment & Plan:  74 year old female with chronic tracheostomy who was transferred for HCAP and AKI requiring CRRT. Now completed antibiotics and has been started on HD.  Chronic tracheostomy status, placed in 11/2018. Discussed with family who was under the impression she would be decannulated when ready however due to transfer of care from [facility], she seems to have been lost to follow-up. 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 SSI coverage  Anemia of critical illness and chronic disease. Thrombocytopenia. Follow CBC,  transfuse for hemoglobin less than 7  Anxiety Continue home Paxil at half dose and Xanax as needed  Disposition Transfer to floor May need out-of-state placement due to trach and dialysis  Best practice:  Diet: TF DVT prophylaxis: Start SQ heparin GI prophylaxis: protonix Mobility: BR Code Status: FULL Disposition: Transfer to floor, signed out to [facility] Family: Discussed plan and trach care plan with daughter.  Care Time: 75 min  Rodman Pickle, M.D. [facility] Pulmonary/Critical Care Medicine 01/18/2020 8:20 AM

## 2020-01-19 ENCOUNTER — Inpatient Hospital Stay (HOSPITAL_COMMUNITY): Payer: Medicare Other

## 2020-01-19 DIAGNOSIS — R579 Shock, unspecified: Secondary | ICD-10-CM | POA: Diagnosis not present

## 2020-01-19 DIAGNOSIS — N179 Acute kidney failure, unspecified: Secondary | ICD-10-CM | POA: Diagnosis not present

## 2020-01-19 DIAGNOSIS — Z930 Tracheostomy status: Secondary | ICD-10-CM | POA: Diagnosis not present

## 2020-01-19 DIAGNOSIS — R601 Generalized edema: Secondary | ICD-10-CM

## 2020-01-19 DIAGNOSIS — E871 Hypo-osmolality and hyponatremia: Secondary | ICD-10-CM

## 2020-01-19 DIAGNOSIS — M7989 Other specified soft tissue disorders: Secondary | ICD-10-CM

## 2020-01-19 DIAGNOSIS — J9611 Chronic respiratory failure with hypoxia: Secondary | ICD-10-CM | POA: Diagnosis not present

## 2020-01-19 LAB — GLUCOSE, CAPILLARY
Glucose-Capillary: 137 mg/dL — ABNORMAL HIGH (ref 70–99)
Glucose-Capillary: 139 mg/dL — ABNORMAL HIGH (ref 70–99)
Glucose-Capillary: 139 mg/dL — ABNORMAL HIGH (ref 70–99)
Glucose-Capillary: 159 mg/dL — ABNORMAL HIGH (ref 70–99)
Glucose-Capillary: 167 mg/dL — ABNORMAL HIGH (ref 70–99)
Glucose-Capillary: 175 mg/dL — ABNORMAL HIGH (ref 70–99)

## 2020-01-19 LAB — CBC
HCT: 24.1 % — ABNORMAL LOW (ref 36.0–46.0)
Hemoglobin: 7.1 g/dL — ABNORMAL LOW (ref 12.0–15.0)
MCH: 29.1 pg (ref 26.0–34.0)
MCHC: 29.5 g/dL — ABNORMAL LOW (ref 30.0–36.0)
MCV: 98.8 fL (ref 80.0–100.0)
Platelets: 77 10*3/uL — ABNORMAL LOW (ref 150–400)
RBC: 2.44 MIL/uL — ABNORMAL LOW (ref 3.87–5.11)
RDW: 18.5 % — ABNORMAL HIGH (ref 11.5–15.5)
WBC: 4.3 10*3/uL (ref 4.0–10.5)
nRBC: 0 % (ref 0.0–0.2)

## 2020-01-19 LAB — RENAL FUNCTION PANEL
Albumin: 2.3 g/dL — ABNORMAL LOW (ref 3.5–5.0)
Albumin: 2.4 g/dL — ABNORMAL LOW (ref 3.5–5.0)
Anion gap: 10 (ref 5–15)
Anion gap: 12 (ref 5–15)
BUN: 63 mg/dL — ABNORMAL HIGH (ref 8–23)
BUN: 76 mg/dL — ABNORMAL HIGH (ref 8–23)
CO2: 30 mmol/L (ref 22–32)
CO2: 28 mmol/L (ref 22–32)
Calcium: 8.5 mg/dL — ABNORMAL LOW (ref 8.9–10.3)
Calcium: 8.4 mg/dL — ABNORMAL LOW (ref 8.9–10.3)
Chloride: 97 mmol/L — ABNORMAL LOW (ref 98–111)
Chloride: 94 mmol/L — ABNORMAL LOW (ref 98–111)
Creatinine, Ser: 1.96 mg/dL — ABNORMAL HIGH (ref 0.44–1.00)
Creatinine, Ser: 2.29 mg/dL — ABNORMAL HIGH (ref 0.44–1.00)
GFR calc Af Amer: 29 mL/min — ABNORMAL LOW (ref >60)
GFR calc Af Amer: 24 mL/min — ABNORMAL LOW (ref >60)
GFR calc non Af Amer: 25 mL/min — ABNORMAL LOW (ref >60)
GFR calc non Af Amer: 21 mL/min — ABNORMAL LOW (ref >60)
Glucose, Bld: 166 mg/dL — ABNORMAL HIGH (ref 70–99)
Glucose, Bld: 168 mg/dL — ABNORMAL HIGH (ref 70–99)
Phosphorus: 5.8 mg/dL — ABNORMAL HIGH (ref 2.5–4.6)
Phosphorus: 6.6 mg/dL — ABNORMAL HIGH (ref 2.5–4.6)
Potassium: 4.6 mmol/L (ref 3.5–5.1)
Potassium: 5.1 mmol/L (ref 3.5–5.1)
Sodium: 137 mmol/L (ref 135–145)
Sodium: 134 mmol/L — ABNORMAL LOW (ref 135–145)

## 2020-01-19 LAB — MAGNESIUM: Magnesium: 2.5 mg/dL — ABNORMAL HIGH (ref 1.7–2.4)

## 2020-01-19 NOTE — Care Management (Addendum)
Bailey Cox wit [facility] declined referral at present, they cannot accept  a trach and HD patient, however if there if a possibility patient maybe decannulated or not chronic HD , she may be able to offer bed.  She will continue to follow patient.   Will check with [facility].  Magdalen Spatz RN

## 2020-01-19 NOTE — Progress Notes (Signed)
Physical Therapy Treatment Patient Details Name: Bailey Cox MRN: [id number] DOB: 22-Jan-1946 Today's Date: 01/19/2020    History of Present Illness 32y F w PMHx of chronic respiratory failure on trach, AKI/CKD/ESRD, recent cardiac arrest, chronic mental impairment, MRSA pneumonia on vanc/cefepime presenting from [facility] for hypotension, volume overload. Patient has had extensive admission at [place] for almost 3 months, discharged yesterday to [facility] and immediately referred to ER from [facility].  Trach pre admission, CRRT on admission    PT Comments    Pt demonstrates minimal progress with functional mobility at this point due to minimal participation today.  Pt did demonstrate strong core control 5/5 back extension (clinician resisted flexion x10), quad strength 4/5 (resisted minisquats x10 ea), and 4+/5 hip adduction (resisting clinician abduction x10 ea side). Due to strength anticipate patient will progress well with functional mobility at this point if participating fully. PT will continue to follow acutely until transition to next level of care to address below listed deficits.   Follow Up Recommendations  SNF;Supervision/Assistance - 24 hour     Equipment Recommendations  Other (comment)(TBD)    Recommendations for Other Services       Precautions / Restrictions Restrictions Weight Bearing Restrictions: No    Mobility  Bed Mobility Overal bed mobility: Needs Assistance Bed Mobility: Supine to Sit     Supine to sit: Total assist     General bed mobility comments: Pt did not assist with any mobility, resisting very strongly. Due to current ability to resist movement, antcipate patient would be more capable for mobility if willing to participate.  Transfers Overall transfer level: Needs assistance      General transfer comment: Transfer not safe, patient unwilling to attempt      Balance Overall balance assessment: Mild deficits observed, not formally tested      Sitting balance - Comments: Unable to sit EOB due to patient resisting attempt.        Cognition Arousal/Alertness: Awake/alert Behavior During Therapy: Flat affect;Anxious Overall Cognitive Status: Difficult to assess         General Comments: Pt was slow to answer environment questions, requiring multiple repititions, answered everything with head shakes      Exercises General Exercises - Lower Extremity Ankle Circles/Pumps: Strengthening;Both;5 reps;Supine(Clinician graded resistance) Heel Slides: Strengthening;Both;10 reps;Supine(Clinician graded resistance) Mini-Sqauts: Strengthening;10 reps;Supine(Clinician graded resistance) Other Exercises Other Exercises: attempted to range hips/knee bil without complete success due to pt cocontracting Other Exercises: Isometric back extension x10        Pertinent Vitals/Pain Pain Assessment: Faces Faces Pain Scale: No hurt           PT Goals (current goals can now be found in the care plan section) Acute Rehab PT Goals Patient Stated Goal: unable to state PT Goal Formulation: Patient unable to participate in goal setting Time For Goal Achievement: 01/30/20 Potential to Achieve Goals: Fair Progress towards PT goals: Not progressing toward goals - comment(Pt appears resistance to functional movement with therapy)    Frequency    Min 3X/week      PT Plan Current plan remains appropriate       AM-PAC PT "6 Clicks" Mobility   Outcome Measure  Help needed turning from your back to your side while in a flat bed without using bedrails?: Total Help needed moving from lying on your back to sitting on the side of a flat bed without using bedrails?: Total Help needed moving to and from a bed to a chair (including a wheelchair)?: Total  Help needed standing up from a chair using your arms (e.g., wheelchair or bedside chair)?: Total Help needed to walk in hospital room?: Total Help needed climbing 3-5 steps with a railing? :  Total 6 Click Score: 6    End of Session   Activity Tolerance: Other (comment)(appears to be limited due to anxiety) Patient left: in bed;with call bell/phone within reach;with bed alarm set;with SCD's reapplied Nurse Communication: Mobility status;Need for lift equipment PT Visit Diagnosis: Other abnormalities of gait and mobility (R26.89);Muscle weakness (generalized) (M62.81);Difficulty in walking, not elsewhere classified (R26.2)     Time: 7356-7014 PT Time Calculation (min) (ACUTE ONLY): 12 min  Charges:  $Therapeutic Exercise: 8-22 mins                     Ann Held PT, DPT Acute Rehab [facility] Rehabilitation P: [phone]    Bailey Cox A Zyla Dascenzo 01/19/2020, 11:25 AM

## 2020-01-19 NOTE — Progress Notes (Signed)
PROGRESS NOTE    Bailey Cox  [name] DOB: [birth year] DOA: 01/05/2020 PCP: Patient, No Pcp Per   Brief Narrative:  The patient is a 74 year old obese Caucasian female who was admitted on 01/05/2020 from [facility] where she had just arrived after a long admission to [facility] where she was hospitalized from 10/2019 up until 01/05/2020 where she suffered a PEA arrest during endoscopy and ended up requiring a tracheostomy.  She presented to [facility] with widespread peripheral edema and acidosis.    Originally she was hospitalized in September 2020 with acute cholecystitis and cholecystectomy.  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  While hospitalized she developed fluid overload and ultimately transferred to [facility] for dialysis.  Her clinical course after that point became somewhat unclear but family described that she was on a ventilator for several procedures and was eventually left intubated.  1 of those procedures and endoscopy which she unfortunately suffered a cardiac arrest.  Again details of the cardiac arrest or not clear but she remained on the ventilator and ultimately underwent a tracheostomy.  At that time her hospital course was also complicated by MRSA pneumonia.  She is ultimately able to come off of hemodialysis and treated with intermittent diuresis.  During her hospital stay she bounced in and out of the ICU for a few times with volume overload and at the end of her admission at [facility] she still struggled with kidney disease and was being treated for urinary tract infection with vancomycin and cefepime.  Plans were stopped this on 3 2.  She was discharged on the [facility] system on [facility] in [place] upon arrival to [facility] they deemed her too sick for admission transferred her directly to [facility] emergency department with complaints of volume overload and acidosis.   Since then she has been in ICU and has been off of the ventilator and now on trach collar.  She underwent CRRT from 3 9 until 312 and transferred to the floor on 3/14 after she was tolerating hemodialysis.  Currently nephrology has been consulted and they feel that she has an AKI on CKD most likely cause of her AKI being from ATN from sepsis and family still wants aggressive measures so she was on CRRT from 3 7 until 311 for clearance and also for anasarca and volume management.  Her CRRT was stopped on 311 and Dr. Moshe Cipro suspected that she will likely require renal replacement therapy in the setting of intermittent hemodialysis.  They feel that if she has a trach and is dialysis dependent the only option would be send her to LTAC.  The plan is for intermittent hemodialysis schedule on Tuesday Thursdays and Saturdays here via [facility].  Placement is likely going to be an issue because of this.    PT OT recommending SNF at this time.  Upon seeing the patient for the first time today and notes that her left arm was extremely swollen so I ordered a left arm extremity duplex.  Currently patient is tolerating trach collar and answers questions yes or no but does not really engage very much.  Venous duplex did not show any evidence of DVT and she likely remains significantly volume overloaded from her anasarca and likely will need dialysis for removal of this fluid.  Assessment & Plan:   Active Problems:   Shock ([place])   Renal failure   Tracheostomy status (  [place])   Chronic hypoxemic respiratory failure (HCC)   Hypotension   Sepsis ([place])  Chronic tracheostomy status and PEG tube with recent sepsis secondary to MRSA pneumonia History of PEA arrest -placed in 11/2018. Discussed with family who was under the impression she would be decannulated when ready however due to transfer of care from [facility] -He was admitted for anasarca and acute respiratory failure and had to be placed on the vent and was dialyzed and  improved and placed back on a trach collar now -she seems to have been lost to follow-up. 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 -Patient's blood sugars have been ranging from 139-175 -Continue with moderate NovoLog sliding scale every every 4 hours checks-continue monitor and trend blood sugars carefully and adjust insulin as needed  Anemia of critical illness and chronic disease. Thrombocytopenia. -Patient's hemoglobin/hematocrit was 7.1/24.1 on admission and her platelet count is 77,000 -We will continue to monitor for signs and symptoms of bleeding -Currently no overt bleeding noted -Continue with darbepoetin alfa -Continue to monitor CBCs and transfuse for hemoglobin less than 7  Hyponatremia -Mild with a sodium of 134  -likely will be collected in dialysis tomorrow -DM  monitor and trend and repeat CMP in a.m.  Anxiety -Continue home paroxetine 30 mg per tube daily at half dose and alprazolam 0.25 mg 3 times daily as needed -Continue trazodone 25 mg per tube nightly  GERD/GI prophylaxis -Continue with pantoprazole 40 mg per tube every 24 hours  Left arm swelling -Likely in the setting of anasarca lower lobe DVT -Left upper extremity venous duplex showed no evidence of DVT  Obesity -Estimated body mass index is 34.85 kg/m as calculated from the following:   Height as of this encounter: '5\' 4"'$  (1.626 m).   Weight as of this encounter: 92.1 kg. -C/w Weight Loss and Dietary Counseling Efforts  DVT prophylaxis: Sq Heparin 5,000 sq q8h  Code Status: FULL CODE Family Communication: No family present at bedside  Disposition Plan: Patient has been in ICU for 14 days and has a tracheostomy and likely requires intermittent dialysis.  She will likely require an LTAC at discharge and disposition may be difficult has been refused for the [facility] and they will reach out to select specialty.  Consultants:   Nephrology  Wound care  Interventional radiology  Procedures:  Echo 10/24/2019 > LVEF 55%, Grade 1 DD. RV systolic function normal.  Echo 3/2: LVEF 65-70%, grade II dysfunction. RVSP  Procedures:  PICC pre  admit > Trach pre admit > 3/5 IJ vas cath->  Significant Diagnostic Tests:  Echo 10/24/2019 > LVEF 55%, Grade 1 DD. RV systolic function normal.  Echo 3/2: LVEF 65-70%, grade II dysfunction. RVSP  Micro Data:  Blood 3/1 >> ng Urine 3/1 >> ng resp 3/4 >> pan-sensitive pseudomonas   Antimicrobials:  Meropenem 2/21 >> off  Cefepime Pre hosp (for tracheitis )  >> 3/1-> 3/7 Vancomycin pre hosp >> 3/2   Antimicrobials:  Anti-infectives (From admission, onward)   Start     Dose/Rate Route Frequency Ordered Stop   01/10/20 0930  vancomycin (VANCOCIN) IVPB 1000 mg/200 mL premix  Status:  Discontinued     1,000 mg 200 mL/hr over 60  Minutes Intravenous  Once 01/10/20 0915 01/13/20 0623   01/06/20 2200  ceFEPIme (MAXIPIME) 2 g in sodium chloride 0.9 % 100 mL IVPB     2 g 200 mL/hr over 30 Minutes Intravenous Every 24 hours 01/05/20 2251 01/11/20 2234   01/05/20 2200  ceFEPIme (MAXIPIME) 2 g in sodium chloride 0.9 % 100 mL IVPB     2 g 200 mL/hr over 30 Minutes Intravenous  Once 01/05/20 2148 01/06/20 0015   01/05/20 2200  metroNIDAZOLE (FLAGYL) IVPB 500 mg     500 mg 100 mL/hr over 60 Minutes Intravenous  Once 01/05/20 2148 01/06/20 0015   01/05/20 2200  vancomycin (VANCOCIN) IVPB 1000 mg/200 mL premix  Status:  Discontinued     1,000 mg 200 mL/hr over 60 Minutes Intravenous  Once 01/05/20 2148 01/05/20 2151   01/05/20 2200  vancomycin (VANCOREADY) IVPB 2000 mg/400 mL  Status:  Discontinued     2,000 mg 200 mL/hr over 120 Minutes Intravenous  Once 01/05/20 2151 01/05/20 2223     Subjective: Seen and examined at bedside she is on a trach collar and she did not really respond very much but she did answer questions yes and no and wanted to be left alone.  She did not appear to be in any respiratory distress but she continues to have significant volume overload.  Left arm is extremely swollen and bruised.  No nausea or vomiting.  Nursing states that she had to be placed in soft mitten restraints due to agitation.  No other concerns or points at this time and currently will go for dialysis tomorrow.  Objective: Vitals:   01/19/20 0845 01/19/20 1146 01/19/20 1210 01/19/20 1640  BP:  (!) 143/52    Pulse:  68 69   Resp:  20 20   Temp:  97.6 F (36.4 C)    TempSrc:  Oral    SpO2: 100% 99% 99% 99%  Weight:      Height:        Intake/Output Summary (Last 24 hours) at 01/19/2020 1833 Last data filed at 01/19/2020 1439 Gross per 24 hour  Intake 285 ml  Output --  Net 285 ml   Filed Weights   01/17/20 0720 01/17/20 1144 01/18/20 0500  Weight: 94.4 kg 91 kg 92.1 kg   Examination: Physical Exam:  Constitutional:  WN/WD obese Caucasian female NAD and appears calm resting but does appear slightly uncomfortable Eyes: Lids and conjunctivae normal, sclerae anicteric  ENMT: External Ears, Nose appear normal. Grossly normal hearing.  Neck: Appears normal, supple, no cervical masses, normal ROM, no appreciable thyromegaly; tracheostomy in place and wearing a trach collar Respiratory: Diminished to auscultation bilaterally with coarse breath, no wheezing, rales, rhonchi or crackles. Normal respiratory effort and patient is not tachypenic. No  accessory muscle use.  Arms unlabored breathing but is on trach collar Cardiovascular: RRR, no murmurs / rubs / gallops. S1 and S2 auscultated.  Significant amount of upper arm extremity edema lower extremity edema consistent with some mild anasarca Abdomen: Soft, non-tender, distended secondary body habitus.  PEG tube is in place  GU: Deferred. Musculoskeletal: No clubbing / cyanosis of digits/nails.  Left upper arm is extremely swollen and bruised; she has a right IJ tunneled dialysis catheter Skin: No appreciable rashes or lesions on limited skin evaluation but does have some extreme bruising on her left arm no induration; Warm and dry.  Neurologic: CN 2-12 grossly intact with no focal deficits but she did not really verbalize anything respond to me given that she wanted to be left alone.  Romberg sign and cerebellar reflexes not assessed.  Psychiatric: Impaired judgment and insight.  She is drowsy and sleepy but easily arousable. Normal mood and appropriate affect.   Data Reviewed: I have personally reviewed following labs and imaging studies  CBC: Recent Labs  Lab 01/15/20 0435 01/16/20 0521 01/17/20 0424 01/18/20 0542 01/19/20 0315  WBC 7.8 7.2 8.4 5.5 4.3  HGB 7.9* 7.6* 7.3* 7.0* 7.1*  HCT 26.1* 25.7* 24.4* 24.0* 24.1*  MCV 96.3 98.5 98.4 98.0 98.8  PLT 55* 67* 77* 75* 77*   Basic Metabolic Panel: Recent Labs  Lab 01/15/20 0435 01/15/20 1703 01/16/20 0521  01/16/20 1700 01/17/20 0424 01/17/20 0424 01/17/20 1615 01/18/20 0542 01/18/20 1630 01/19/20 0315 01/19/20 1739  NA 136   < > 136   < > 136   < > 136 138 135 137 134*  K 4.3   < > 4.9   < > 5.2*   < > 3.8 4.4 4.7 4.6 5.1  CL 102   < > 100   < > 99   < > 99 99 98 97* 94*  CO2 27   < > 28   < > 28   < > '27 30 30 30 28  '$ GLUCOSE 147*   < > 134*   < > 115*   < > 154* 106* 158* 166* 168*  BUN 23   < > 35*   < > 60*   < > 27* 41* 55* 63* 76*  CREATININE 0.69   < > 0.91   < > 1.69*   < > 1.12* 1.22* 1.76* 1.96* 2.29*  CALCIUM 8.4*   < > 8.7*   < > 8.8*   < > 8.2* 8.5* 8.2* 8.5* 8.4*  MG 2.4  --  2.4  --  2.7*  --   --  2.4  --  2.5*  --   PHOS 2.1*   < > 3.1   < > 4.0   < > 2.5 3.9 4.9* 5.8* 6.6*   < > = values in this interval not displayed.   GFR: Estimated Creatinine Clearance: 24.8 mL/min (A) (by C-G formula based on SCr of 2.29 mg/dL (H)). Liver Function Tests: Recent Labs  Lab 01/17/20 1615 01/18/20 0542 01/18/20 1630 01/19/20 0315 01/19/20 1739  ALBUMIN 2.4* 2.3* 2.3* 2.3* 2.4*   No results for input(s): LIPASE, AMYLASE in the last 168 hours. No results for input(s): AMMONIA in the last 168 hours. Coagulation Profile: No results for input(s): INR, PROTIME in the last 168 hours. Cardiac Enzymes: No results for input(s): CKTOTAL, CKMB, CKMBINDEX, TROPONINI in the last 168 hours. BNP (last 3 results) No results for input(s): PROBNP in the last 8760 hours. HbA1C:  No results for input(s): HGBA1C in the last 72 hours. CBG: Recent Labs  Lab 01/18/20 2333 01/19/20 0430 01/19/20 0806 01/19/20 1147 01/19/20 1637  GLUCAP 153* 175* 167* 159* 139*   Lipid Profile: No results for input(s): CHOL, HDL, LDLCALC, TRIG, CHOLHDL, LDLDIRECT in the last 72 hours. Thyroid Function Tests: No results for input(s): TSH, T4TOTAL, FREET4, T3FREE, THYROIDAB in the last 72 hours. Anemia Panel: No results for input(s): VITAMINB12, FOLATE, FERRITIN, TIBC, IRON, RETICCTPCT in the last 72  hours. Sepsis Labs: No results for input(s): PROCALCITON, LATICACIDVEN in the last 168 hours.  No results found for this or any previous visit (from the past 240 hour(s)).   RN Pressure Injury Documentation:     Estimated body mass index is 34.85 kg/m as calculated from the following:   Height as of this encounter: '5\' 4"'$  (1.626 m).   Weight as of this encounter: 92.1 kg.  Malnutrition Type:  Nutrition Problem: Inadequate oral intake Etiology: inability to eat   Malnutrition Characteristics:  Signs/Symptoms: NPO status   Nutrition Interventions:  Interventions: Tube feeding   Radiology Studies: VAS [place] UPPER EXTREMITY VENOUS DUPLEX  Result Date: 01/19/2020 UPPER VENOUS STUDY  Indications: Swelling Risk Factors: None identified. Limitations: Bandages, line and Trach, patient positioning, poor patient cooperation. Comparison Study: 01/10/2020 - Negative for DVT. Performing Technologist: Oliver Hum RVT  Examination Guidelines: A complete evaluation includes B-mode imaging, spectral Doppler, color Doppler, and power Doppler as needed of all accessible portions of each vessel. Bilateral testing is considered an integral part of a complete examination. Limited examinations for reoccurring indications may be performed as noted.  Right Findings: +----------+------------+---------+-----------+----------+--------------+ RIGHT     CompressiblePhasicitySpontaneousProperties   Summary     +----------+------------+---------+-----------+----------+--------------+ Subclavian                                          Not visualized +----------+------------+---------+-----------+----------+--------------+  Left Findings: +----------+------------+---------+-----------+----------+--------------+ LEFT      CompressiblePhasicitySpontaneousProperties   Summary     +----------+------------+---------+-----------+----------+--------------+ IJV                                                  Not visualized +----------+------------+---------+-----------+----------+--------------+ Subclavian    Full       Yes       Yes                             +----------+------------+---------+-----------+----------+--------------+ Axillary      Full       Yes       Yes                             +----------+------------+---------+-----------+----------+--------------+ Brachial      Full       Yes       Yes                             +----------+------------+---------+-----------+----------+--------------+ Radial        Full                                                 +----------+------------+---------+-----------+----------+--------------+  Ulnar         Full                                                 +----------+------------+---------+-----------+----------+--------------+ Cephalic      Full                                                 +----------+------------+---------+-----------+----------+--------------+ Basilic       Full                                                 +----------+------------+---------+-----------+----------+--------------+  Summary:  Left: No evidence of deep vein thrombosis in the upper extremity. No evidence of superficial vein thrombosis in the upper extremity.  *See table(s) above for measurements and observations.    Preliminary    Scheduled Meds: . chlorhexidine gluconate (MEDLINE KIT)  15 mL Mouth Rinse BID  . Chlorhexidine Gluconate Cloth  6 each Topical Daily  . Chlorhexidine Gluconate Cloth  6 each Topical Q0600  . darbepoetin (ARANESP) injection - NON-DIALYSIS  100 mcg Subcutaneous Q Mon-1800  . feeding supplement (PRO-STAT SUGAR FREE 64)  60 mL Per Tube BID  . heparin  5,000 Units Subcutaneous Q8H  . insulin aspart  0-15 Units Subcutaneous Q4H  . mouth rinse  15 mL Mouth Rinse 10 times per day  . pantoprazole sodium  40 mg Per Tube Q24H  . PARoxetine  30 mg Per Tube Daily  . sodium chloride flush   10-40 mL Intracatheter Q12H  . traZODone  25 mg Per Tube QHS   Continuous Infusions: . sodium chloride 100 mL (01/10/20 0105)  . sodium chloride 100 mL (01/10/20 0056)  . feeding supplement (OSMOLITE 1.5 CAL) 1,000 mL (01/18/20 2008)  . sodium chloride      LOS: 14 days   Kerney Elbe, DO [facility] PAGER is on [place]  If 7PM-7AM, please contact night-coverage [email or web address]

## 2020-01-19 NOTE — Progress Notes (Signed)
[facility] Progress Note    74 y.o.femalewho had a prolonged 81 day hospitalization at [facility] with  HTN COPD DM hypothyroidism Ogilvie's syndrome. PEA arrest during EGD in early December.Intermittent hyperkalemia treatet medically and with HD at OSH and now recently w/ worsening renal function over the past few days prior to admission to [facility]. She actually had a foley placed 12/28/19 when the renal function was worsening with return of purulence - started on Meropenem. Per review of chart the pt did not want RRT but after urging by family agreed to it.   Assessment/ Plan:   1. AKI on CKD with most likely cause of the AKI being ATN from sepsis.  - She is not a great candidate for long term dialysis due to trach and unclear how much overall recovery potential after 4 month hosp - Granddaughter is Bailey Cox [phone] and daughter is Bailey Cox [phone]; both have POA.  Dr. Augustin Coupe spoke with family and they wanted RRT; actually was on dialysis off and on while at [facility] and due to go to [facility].   Attempted iHD 3/5  -> cath converted by VIR to TC- then started CRRT on 3/7-3/11 for clearance and also for the anasarca/ volume management-   stopped CRRT on 3/11.  Suspect will need ongoing RRT in the form of IHD.  I am just concerned about the endpoint-  With trach if she remains HD dep the only option is LTACH-  She was initially sent to [facility] so that does not appear to be an issue.   Nursing indicates that family is set on aggressive care- have refused DNR.  Planning for IHD to keep on TTS  schedule while here via [facility], next planned for 3/16; tolerated IHD 3/13 with 3.5L off.  2. Sepsis -  No longer on pressors -  broad spectrum abx(cefepime and vanc) 3. ?urosepsis -  urine is clearer clinically 4. H/o PEA arrest 5. MRSA PNA on vanc/cefepime  6. Respiratory failure with  trach 7. Htn.vol-  Massive volume overload-  15 liters negative with CRRT-  Will cont  UF as able on IHD- tolerated 3500 UF on Sat 8. Anemia-   iron stores OK- giving ESA - transfuse PRN 9. elytes-  Phos and K  OK   Subjective:     No complaints for now.  Sleeping, arousable.     Objective:   BP (!) 143/52 (BP Location: Left Leg)   Pulse 68   Temp 97.6 F (36.4 C) (Oral)   Resp 20   Ht _0  (1.626 m)   Wt 92.1 kg   SpO2 99%   BMI 34.85 kg/m   Intake/Output Summary (Last 24 hours) at 01/19/2020 1318 Last data filed at 01/18/2020 1800 Gross per 24 hour  Intake 0 ml  Output --  Net 0 ml   Weight change:   Physical Exam: GEN: NAD,sleeping, arousable HEENT: No conjunctival pallor, EOMI NECK: Supple, no thyromegaly LUNGS: CTA anteriorly CV: RRR, No M/R/G ABD: NA EXT: dependent and flank edema ACCESS: RIJ TC  Imaging: No results found.  Labs: BMET Recent Labs  Lab 01/16/20 0521 01/16/20 1700 01/17/20 0424 01/17/20 1615 01/18/20 0542 01/18/20 1630 01/19/20 0315  NA 136 135 136 136 138 135 137  K 4.9 4.8 5.2* 3.8 4.4 4.7 4.6  CL 100 100 99 99 99 98 97*  CO2 _1 GLUCOSE 134* 131* 115* 154* 106* 158* 166*  BUN 35* 48*  60* 27* 41* 55* 63*  CREATININE 0.91 1.40* 1.69* 1.12* 1.22* 1.76* 1.96*  CALCIUM 8.7* 8.7* 8.8* 8.2* 8.5* 8.2* 8.5*  PHOS 3.1 3.5 4.0 2.5 3.9 4.9* 5.8*   CBC Recent Labs  Lab 01/16/20 0521 01/17/20 0424 01/18/20 0542 01/19/20 0315  WBC 7.2 8.4 5.5 4.3  HGB 7.6* 7.3* 7.0* 7.1*  HCT 25.7* 24.4* 24.0* 24.1*  MCV 98.5 98.4 98.0 98.8  PLT 67* 77* 75* 77*    Medications:    . chlorhexidine gluconate (MEDLINE KIT)  15 mL Mouth Rinse BID  . Chlorhexidine Gluconate Cloth  6 each Topical Daily  . Chlorhexidine Gluconate Cloth  6 each Topical Q0600  . darbepoetin (ARANESP) injection - NON-DIALYSIS  100 mcg Subcutaneous Q Mon-1800  . feeding supplement (PRO-STAT SUGAR FREE 64)  60 mL Per Tube BID  . heparin  5,000 Units Subcutaneous Q8H  . insulin aspart  0-15 Units Subcutaneous Q4H  . mouth rinse  15 mL  Mouth Rinse 10 times per day  . pantoprazole sodium  40 mg Per Tube Q24H  . PARoxetine  30 mg Per Tube Daily  . sodium bicarbonate  650 mg Per Tube BID  . sodium chloride flush  10-40 mL Intracatheter Q12H  . traZODone  25 mg Per Tube QHS      Bailey Cox  01/19/2020, 1:18 PM

## 2020-01-19 NOTE — Care Management Important Message (Signed)
Important Message  Patient Details  Name: Tai Syfert MRN: [id number] Date of Birth: [birth year]   Medicare Important Message Given:  Yes     Shelda Altes 01/19/2020, 11:58 AM

## 2020-01-19 NOTE — Progress Notes (Signed)
Left upper extremity venous duplex has been completed. Preliminary results can be found in CV Proc through chart review.   01/19/20 12:34 PM Bailey Cox RVT

## 2020-01-20 DIAGNOSIS — R579 Shock, unspecified: Secondary | ICD-10-CM | POA: Diagnosis not present

## 2020-01-20 DIAGNOSIS — N179 Acute kidney failure, unspecified: Secondary | ICD-10-CM | POA: Diagnosis not present

## 2020-01-20 DIAGNOSIS — J9611 Chronic respiratory failure with hypoxia: Secondary | ICD-10-CM | POA: Diagnosis not present

## 2020-01-20 DIAGNOSIS — Z930 Tracheostomy status: Secondary | ICD-10-CM | POA: Diagnosis not present

## 2020-01-20 LAB — RENAL FUNCTION PANEL
Albumin: 2.4 g/dL — ABNORMAL LOW (ref 3.5–5.0)
Albumin: 2.3 g/dL — ABNORMAL LOW (ref 3.5–5.0)
Anion gap: 11 (ref 5–15)
Anion gap: 7 (ref 5–15)
BUN: 87 mg/dL — ABNORMAL HIGH (ref 8–23)
BUN: 43 mg/dL — ABNORMAL HIGH (ref 8–23)
CO2: 29 mmol/L (ref 22–32)
CO2: 30 mmol/L (ref 22–32)
Calcium: 8.5 mg/dL — ABNORMAL LOW (ref 8.9–10.3)
Calcium: 8 mg/dL — ABNORMAL LOW (ref 8.9–10.3)
Chloride: 95 mmol/L — ABNORMAL LOW (ref 98–111)
Chloride: 97 mmol/L — ABNORMAL LOW (ref 98–111)
Creatinine, Ser: 2.42 mg/dL — ABNORMAL HIGH (ref 0.44–1.00)
Creatinine, Ser: 1.45 mg/dL — ABNORMAL HIGH (ref 0.44–1.00)
GFR calc Af Amer: 23 mL/min — ABNORMAL LOW (ref >60)
GFR calc Af Amer: 42 mL/min — ABNORMAL LOW (ref >60)
GFR calc non Af Amer: 19 mL/min — ABNORMAL LOW (ref >60)
GFR calc non Af Amer: 36 mL/min — ABNORMAL LOW (ref >60)
Glucose, Bld: 121 mg/dL — ABNORMAL HIGH (ref 70–99)
Glucose, Bld: 178 mg/dL — ABNORMAL HIGH (ref 70–99)
Phosphorus: 6.9 mg/dL — ABNORMAL HIGH (ref 2.5–4.6)
Phosphorus: 4.2 mg/dL (ref 2.5–4.6)
Potassium: 5 mmol/L (ref 3.5–5.1)
Potassium: 4.1 mmol/L (ref 3.5–5.1)
Sodium: 135 mmol/L (ref 135–145)
Sodium: 134 mmol/L — ABNORMAL LOW (ref 135–145)

## 2020-01-20 LAB — COMPREHENSIVE METABOLIC PANEL
ALT: 23 U/L (ref 0–44)
AST: 29 U/L (ref 15–41)
Albumin: 2.4 g/dL — ABNORMAL LOW (ref 3.5–5.0)
Alkaline Phosphatase: 198 U/L — ABNORMAL HIGH (ref 38–126)
Anion gap: 10 (ref 5–15)
BUN: 86 mg/dL — ABNORMAL HIGH (ref 8–23)
CO2: 29 mmol/L (ref 22–32)
Calcium: 8.5 mg/dL — ABNORMAL LOW (ref 8.9–10.3)
Chloride: 97 mmol/L — ABNORMAL LOW (ref 98–111)
Creatinine, Ser: 2.53 mg/dL — ABNORMAL HIGH (ref 0.44–1.00)
GFR calc Af Amer: 21 mL/min — ABNORMAL LOW (ref >60)
GFR calc non Af Amer: 18 mL/min — ABNORMAL LOW (ref >60)
Glucose, Bld: 121 mg/dL — ABNORMAL HIGH (ref 70–99)
Potassium: 5 mmol/L (ref 3.5–5.1)
Sodium: 136 mmol/L (ref 135–145)
Total Bilirubin: 0.5 mg/dL (ref 0.3–1.2)
Total Protein: 5.9 g/dL — ABNORMAL LOW (ref 6.5–8.1)

## 2020-01-20 LAB — CBC WITH DIFFERENTIAL/PLATELET
Abs Immature Granulocytes: 0.02 10*3/uL (ref 0.00–0.07)
Basophils Absolute: 0 10*3/uL (ref 0.0–0.1)
Basophils Relative: 0 %
Eosinophils Absolute: 0.1 10*3/uL (ref 0.0–0.5)
Eosinophils Relative: 3 %
HCT: 25.2 % — ABNORMAL LOW (ref 36.0–46.0)
Hemoglobin: 7.6 g/dL — ABNORMAL LOW (ref 12.0–15.0)
Immature Granulocytes: 1 %
Lymphocytes Relative: 14 %
Lymphs Abs: 0.6 10*3/uL — ABNORMAL LOW (ref 0.7–4.0)
MCH: 29.5 pg (ref 26.0–34.0)
MCHC: 30.2 g/dL (ref 30.0–36.0)
MCV: 97.7 fL (ref 80.0–100.0)
Monocytes Absolute: 0.5 10*3/uL (ref 0.1–1.0)
Monocytes Relative: 11 %
Neutro Abs: 2.8 10*3/uL (ref 1.7–7.7)
Neutrophils Relative %: 71 %
Platelets: 73 10*3/uL — ABNORMAL LOW (ref 150–400)
RBC: 2.58 MIL/uL — ABNORMAL LOW (ref 3.87–5.11)
RDW: 18.3 % — ABNORMAL HIGH (ref 11.5–15.5)
WBC: 4 10*3/uL (ref 4.0–10.5)
nRBC: 0.5 % — ABNORMAL HIGH (ref 0.0–0.2)

## 2020-01-20 LAB — GLUCOSE, CAPILLARY
Glucose-Capillary: 113 mg/dL — ABNORMAL HIGH (ref 70–99)
Glucose-Capillary: 132 mg/dL — ABNORMAL HIGH (ref 70–99)
Glucose-Capillary: 132 mg/dL — ABNORMAL HIGH (ref 70–99)
Glucose-Capillary: 141 mg/dL — ABNORMAL HIGH (ref 70–99)
Glucose-Capillary: 149 mg/dL — ABNORMAL HIGH (ref 70–99)
Glucose-Capillary: 157 mg/dL — ABNORMAL HIGH (ref 70–99)

## 2020-01-20 LAB — MAGNESIUM: Magnesium: 2.6 mg/dL — ABNORMAL HIGH (ref 1.7–2.4)

## 2020-01-20 LAB — PHOSPHORUS: Phosphorus: 7 mg/dL — ABNORMAL HIGH (ref 2.5–4.6)

## 2020-01-20 MED ORDER — PRO-STAT SUGAR FREE PO LIQD
30.0000 mL | Freq: Three times a day (TID) | ORAL | Status: DC
  Administered 2020-01-20 – 2020-02-16 (×76): 30 mL
  Filled 2020-01-20 (×75): qty 30

## 2020-01-20 MED ORDER — HEPARIN SODIUM (PORCINE) 1000 UNIT/ML IJ SOLN
INTRAMUSCULAR | Status: AC
  Administered 2020-01-20: 3800 [IU] via INTRAVENOUS_CENTRAL
  Filled 2020-01-20: qty 4

## 2020-01-20 MED ORDER — B COMPLEX-C PO TABS
1.0000 | ORAL_TABLET | Freq: Every day | ORAL | Status: DC
  Administered 2020-01-20 – 2020-02-16 (×28): 1
  Filled 2020-01-20 (×28): qty 1

## 2020-01-20 MED ORDER — NEPRO/CARBSTEADY PO LIQD
1000.0000 mL | ORAL | Status: DC
  Administered 2020-01-20 – 2020-01-23 (×7): 1000 mL
  Filled 2020-01-20 (×5): qty 1500

## 2020-01-20 NOTE — Progress Notes (Signed)
[facility] Progress Note    74 y.o.femalewho had a prolonged 81 day hospitalization at [facility] with  HTN COPD DM hypothyroidism Ogilvie's syndrome. PEA arrest during EGD in early December.Intermittent hyperkalemia treatet medically and with HD at OSH and now recently w/ worsening renal function over the past few days prior to admission to [facility]. She actually had a foley placed 12/28/19 when the renal function was worsening with return of purulence - started on Meropenem. Per review of chart the pt did not want RRT but after urging by family agreed to it.   Assessment/ Plan:   1. AKI on CKD with most likely cause of the AKI being ATN from sepsis.  - She is not a great candidate for long term dialysis due to trach and unclear how much overall recovery potential after 4 month hosp - [name] [phone] and [name] [phone]; both have POA.  Dr. Augustin Coupe spoke with family and they wanted RRT; actually was on dialysis off and on while at [facility] and due to go to [facility].   Attempted iHD 3/5  -> cath converted by VIR to TC- then started CRRT on 3/7-3/11 for clearance and also for the anasarca/ volume management-   stopped CRRT on 3/11.  Suspect will need ongoing RRT in the form of IHD.  I am just concerned about the endpoint-  With trach if she remains HD dep the only option is LTACH-  She was initially sent to [facility] so that does not appear to be an issue.   Nursing indicates that family is set on aggressive care- have refused DNR.  Planning for IHD to keep on TTS  schedule while here via [facility], next planned for 3/16; tolerated IHD 3/13 with 3.5L off.  2. Sepsis -  No longer on pressors -  broad spectrum abx(cefepime and vanc) 3. ?urosepsis -  urine is clearer clinically 4. H/o PEA arrest 5. MRSA PNA on vanc/cefepime  6. Respiratory failure with  trach 7. Htn.vol-  Massive volume overload-  15 liters negative with CRRT-  Will cont  UF as able on IHD 8. Anemia-   iron stores OK- giving ESA - transfuse PRN 9. elytes-  Phos and K  OK  10. Dispo: ? pending  Subjective:     On HD today-more arousable, opens eyes, nods to questions.     Objective:   BP (!) 125/53   Pulse 67   Temp 97.6 F (36.4 C) (Oral)   Resp 18   Ht '5\' 4"'$  (1.626 m)   Wt 93.2 kg   SpO2 100%   BMI 35.27 kg/m   Intake/Output Summary (Last 24 hours) at 01/20/2020 0851 Last data filed at 01/20/2020 0540 Gross per 24 hour  Intake 841.67 ml  Output --  Net 841.67 ml   Weight change:   Physical Exam: GEN: NAD, more easily awakened than yesterday, interactive HEENT: No conjunctival pallor, EOMI NECK: Supple, no thyromegaly LUNGS: CTA anteriorly CV: RRR, No M/R/G ABD: NA EXT: dependent and flank edema ACCESS: RIJ TC  Imaging: DG CHEST PORT 1 VIEW  Result Date: 01/19/2020 CLINICAL DATA:  Short of breath. EXAM: PORTABLE CHEST 1 VIEW COMPARISON:  01/12/2020 FINDINGS: Right chest wall dialysis catheter is noted with tips in the right atrium. Right arm PICC line tip is at the cavoatrial junction. Tracheostomy tube tip is above the carina. Cardiac enlargement and pulmonary vascular congestion. No change in moderate right pleural effusion. IMPRESSION: 1. Persistent bilateral  pleural effusions, right greater than left. 2. Pulmonary vascular congestion, unchanged. 3. Stable support apparatus. Electronically Signed   By: Kerby Moors M.D.   On: 01/19/2020 19:53   VAS [place] UPPER EXTREMITY VENOUS DUPLEX  Result Date: 01/19/2020 UPPER VENOUS STUDY  Indications: Swelling Risk Factors: None identified. Limitations: Bandages, line and Trach, patient positioning, poor patient cooperation. Comparison Study: 01/10/2020 - Negative for DVT. Performing Technologist: Oliver Hum RVT  Examination Guidelines: A complete evaluation includes B-mode imaging, spectral Doppler, color Doppler, and power Doppler as needed of all accessible portions of each vessel. Bilateral  testing is considered an integral part of a complete examination. Limited examinations for reoccurring indications may be performed as noted.  Right Findings: +----------+------------+---------+-----------+----------+--------------+ RIGHT     CompressiblePhasicitySpontaneousProperties   Summary     +----------+------------+---------+-----------+----------+--------------+ Subclavian                                          Not visualized +----------+------------+---------+-----------+----------+--------------+  Left Findings: +----------+------------+---------+-----------+----------+--------------+ LEFT      CompressiblePhasicitySpontaneousProperties   Summary     +----------+------------+---------+-----------+----------+--------------+ IJV                                                 Not visualized +----------+------------+---------+-----------+----------+--------------+ Subclavian    Full       Yes       Yes                             +----------+------------+---------+-----------+----------+--------------+ Axillary      Full       Yes       Yes                             +----------+------------+---------+-----------+----------+--------------+ Brachial      Full       Yes       Yes                             +----------+------------+---------+-----------+----------+--------------+ Radial        Full                                                 +----------+------------+---------+-----------+----------+--------------+ Ulnar         Full                                                 +----------+------------+---------+-----------+----------+--------------+ Cephalic      Full                                                 +----------+------------+---------+-----------+----------+--------------+ Basilic       Full                                                  +----------+------------+---------+-----------+----------+--------------+  Summary:  Left: No evidence of deep vein thrombosis in the upper extremity. No evidence of superficial vein thrombosis in the upper extremity.  *See table(s) above for measurements and observations.  Diagnosing physician: Ruta Hinds MD Electronically signed by Ruta Hinds MD on 01/19/2020 at 7:06:48 PM.    Final     Labs: BMET Recent Labs  Lab 01/17/20 0424 01/17/20 1615 01/18/20 0542 01/18/20 1630 01/19/20 0315 01/19/20 1739 01/20/20 0356  NA 136 136 138 135 137 134* 136  135  K 5.2* 3.8 4.4 4.7 4.6 5.1 5.0  5.0  CL 99 99 99 98 97* 94* 97*  95*  CO2 '28 27 30 30 30 28 29  29  '$ GLUCOSE 115* 154* 106* 158* 166* 168* 121*  121*  BUN 60* 27* 41* 55* 63* 76* 86*  87*  CREATININE 1.69* 1.12* 1.22* 1.76* 1.96* 2.29* 2.53*  2.42*  CALCIUM 8.8* 8.2* 8.5* 8.2* 8.5* 8.4* 8.5*  8.5*  PHOS 4.0 2.5 3.9 4.9* 5.8* 6.6* 7.0*  6.9*   CBC Recent Labs  Lab 01/17/20 0424 01/18/20 0542 01/19/20 0315 01/20/20 0356  WBC 8.4 5.5 4.3 4.0  NEUTROABS  --   --   --  2.8  HGB 7.3* 7.0* 7.1* 7.6*  HCT 24.4* 24.0* 24.1* 25.2*  MCV 98.4 98.0 98.8 97.7  PLT 77* 75* 77* 73*    Medications:    . chlorhexidine gluconate (MEDLINE KIT)  15 mL Mouth Rinse BID  . Chlorhexidine Gluconate Cloth  6 each Topical Daily  . Chlorhexidine Gluconate Cloth  6 each Topical Q0600  . darbepoetin (ARANESP) injection - NON-DIALYSIS  100 mcg Subcutaneous Q Mon-1800  . feeding supplement (PRO-STAT SUGAR FREE 64)  60 mL Per Tube BID  . heparin  5,000 Units Subcutaneous Q8H  . insulin aspart  0-15 Units Subcutaneous Q4H  . mouth rinse  15 mL Mouth Rinse 10 times per day  . pantoprazole sodium  40 mg Per Tube Q24H  . PARoxetine  30 mg Per Tube Daily  . sodium chloride flush  10-40 mL Intracatheter Q12H  . traZODone  25 mg Per Tube QHS      Bailey Cox  01/20/2020, 8:51 AM

## 2020-01-20 NOTE — Care Management (Addendum)
Spoke to [employer] with [facility], patient has no days left for LTAC.   Messaged DR Alfredia Ferguson    Dr Hollie Salk aware.   Magdalen Spatz RN

## 2020-01-20 NOTE — Progress Notes (Signed)
PROGRESS NOTE    Bailey Cox  ZOX:[id number] DOB: 03/21/1946 DOA: 01/05/2020 PCP: Patient, No Pcp Per   Brief Narrative:  The patient is a 75 year old obese Caucasian female who was admitted on 01/05/2020 from [facility] where she had just arrived after a long admission to [facility] where she was hospitalized from 10/2019 up until 01/05/2020 where she suffered a PEA arrest during endoscopy and ended up requiring a tracheostomy.  She presented to [facility] with widespread peripheral edema and acidosis.    Originally she was hospitalized in September 2020 with acute cholecystitis and cholecystectomy.  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  While hospitalized she developed fluid overload and ultimately transferred to [facility] for dialysis.  Her clinical course after that point became somewhat unclear but family described that she was on a ventilator for several procedures and was eventually left intubated.  1 of those procedures and endoscopy which she unfortunately suffered a cardiac arrest.  Again details of the cardiac arrest or not clear but she remained on the ventilator and ultimately underwent a tracheostomy.  At that time her hospital course was also complicated by MRSA pneumonia.  She is ultimately able to come off of hemodialysis and treated with intermittent diuresis.  During her hospital stay she bounced in and out of the ICU for a few times with volume overload and at the end of her admission at [facility] she still struggled with kidney disease and was being treated for urinary tract infection with vancomycin and cefepime.  Plans were stopped this on 3 2.  She was discharged on the [facility] system on [facility] in [place] upon arrival to [facility] they deemed her too sick for admission transferred her directly to [facility] emergency department with complaints of volume overload and acidosis.   Since then she has been in ICU and has been off of the ventilator and now on trach collar.  She underwent CRRT from 3 9 until 312 and transferred to the floor on 3/14 after she was tolerating hemodialysis.  Currently nephrology has been consulted and they feel that she has an AKI on CKD most likely cause of her AKI being from ATN from sepsis and family still wants aggressive measures so she was on CRRT from 3 7 until 311 for clearance and also for anasarca and volume management.  Her CRRT was stopped on 311 and Dr. Moshe Cipro suspected that she will likely require renal replacement therapy in the setting of intermittent hemodialysis.  They feel that if she has a trach and is dialysis dependent the only option would be send her to LTAC.  The plan is for intermittent hemodialysis schedule on Tuesday Thursdays and Saturdays here via [facility].  Placement is likely going to be an issue because of this.    PT OT recommending SNF at this time.  Upon seeing the patient for the first time yesterday it was noted that her left arm was extremely swollen so I ordered a left arm extremity duplex.  Currently patient is tolerating trach collar and answers questions yes or no but does not really engage very much.  Venous duplex did not show any evidence of DVT and she likely remains significantly volume overloaded from her anasarca and likely will need dialysis for removal of this fluid.  01/20/2020: She is seen and examined in dialysis and had no complaints and was resting.  Unfortunately patient  has no days left for LTAC.  In order for her to go to SNF she is to have a cuffless trach in and be able to sit for dialysis so disposition will be extremely difficult at this time and she has very few options unfortunately.  Family still wants full care  Assessment & Plan:   Active Problems:   Shock ([place])   Renal failure   Tracheostomy status ([place])   Chronic hypoxemic respiratory failure (HCC)   Hypotension   Sepsis  ([place])  Chronic tracheostomy status and PEG tube with recent sepsis secondary to MRSA pneumonia History of PEA arrest -placed in 11/2018. Discussed with family who was under the impression she would be decannulated when ready however due to transfer of care from [facility] -He was admitted for anasarca and acute respiratory failure and had to be placed on the vent and was dialyzed and improved and placed back on a trach collar now -she seems to have been lost to follow-up. -Currently she has been off the ventilator since 01/12/2020 -Sepsis physiology is improving -Continue with routine trach care -No longer on vancomycin or cefepime as she is completed course -Continue with pulmonary care -Speech consulted and will use Passy-Muir valve -Continue PT, mobilize -Continued with feeding supplements with Osmolite 1000 g per tube 40 mL/h as well as prostat 60 mL per tube twice daily but this was changed by the dietitian to Nepro 40 mL/h via PEG and continuing at 30 mL opposed to 3 times daily  -SLP was unable to be done today given the patient was in dialysis -Continue oral care per nursing with chlorhexidine -Will need to arrange for follow-up in trach clinic prior to discharge -Pulmonary will continue to follow patient on the floor  AoCKD IV-->Dialysis Hyperphosphatemia -Intermittent HD per Nephrology -Nephrology is on board and appreciate their assistance and they are planning intermittent HD to keep on Tuesday, Thursday, Saturday schedule with a Dialysis for 316 -Last dialysis session was 313 and she had 3-1/2 L off and she continues remain significantly volume overloaded -currently received Darbepoetin Alfa 100 mcg po Daily  -Patient BUN/creatinine went from 55/1.76 is now 63/1.96 but worsened today to 87/2.53 and will be undergoing dialysis today -Patient's phosphorus level is 7.0 -Avoid nephrotoxic medications, contrast dyes and further care per nephrology  DM type II with poor control from  hyperglycemia. -Patient's blood sugars have been ranging from 113-149 -Continue with moderate NovoLog sliding scale every every 4 hours checks-continue monitor and trend blood sugars carefully and adjust insulin as needed  Anemia of critical illness and chronic disease. Thrombocytopenia. -Patient's hemoglobin/hematocrit was 7.1/24.1 yesterday and today it is 7.6/25.2 and her platelet count was 77,000 today was 73,000 -We will continue to monitor for signs and symptoms of bleeding -Currently no overt bleeding noted -Continue with darbepoetin alfa -Continue to monitor CBCs and transfuse for hemoglobin less than 7  Hyponatremia -Mild with a sodium of 134 and today is 135 -likely will be collected in dialysis tomorrow -DM monitor and trend and repeat CMP in a.m.  Anxiety -Continue home paroxetine 30 mg per tube daily at half dose and alprazolam 0.25 mg 3 times daily as needed -Continue trazodone 25 mg per tube nightly  GERD/GI prophylaxis -Continue with pantoprazole 40 mg per tube every 24 hours  Left arm swelling -Likely in the setting of anasarca lower lobe DVT -Left upper extremity venous duplex showed no evidence of DVT  Obesity -Estimated body mass index is 33.68 kg/m as calculated from the following:  Height as of this encounter: '5\' 4"'$  (1.626 m).   Weight as of this encounter: 89 kg. -C/w Weight Loss and Dietary Counseling Efforts  DVT prophylaxis: Sq Heparin 5,000 sq q8h  Code Status: FULL CODE Family Communication: No family present at bedside but I spoke with the patient's Daughter Katharine Look over the phon Disposition Plan: Patient has been in ICU for 14 days and has a tracheostomy and likely requires intermittent dialysis.  She will likely require an LTAC at discharge and disposition may be difficult has been refused for the [facility] as well as the [facility] specialty LTAC.  She can likely go to SNF however she will need to have a cuffless trach and be able to sit for  dialysis so disposition is extremely difficult this time  Consultants:   Nephrology  Wound care  Interventional radiology  Procedures:  Echo 10/24/2019 > LVEF 55%, Grade 1 DD. RV systolic function normal.  Echo 3/2: LVEF 65-70%, grade II dysfunction. RVSP  Procedures:  PICC pre admit > Trach pre admit > 3/5 IJ vas cath->  Significant Diagnostic Tests:  Echo 10/24/2019 > LVEF 55%, Grade 1 DD. RV systolic function normal.  Echo 3/2: LVEF 65-70%, grade II dysfunction. RVSP  Micro Data:  Blood 3/1 >> ng Urine 3/1 >> ng resp 3/4 >> pan-sensitive pseudomonas   Antimicrobials:  Meropenem 2/21 >> off  Cefepime Pre hosp (for tracheitis )  >> 3/1-> 3/7 Vancomycin pre hosp >> 3/2   Antimicrobials:  Anti-infectives (From admission, onward)   Start     Dose/Rate Route Frequency Ordered Stop   01/10/20 0930  vancomycin (VANCOCIN) IVPB 1000 mg/200 mL premix  Status:  Discontinued     1,000 mg 200 mL/hr over 60 Minutes Intravenous  Once 01/10/20 0915 01/13/20 0623   01/06/20 2200  ceFEPIme (MAXIPIME) 2 g in sodium chloride 0.9 % 100 mL IVPB     2 g 200 mL/hr over 30 Minutes Intravenous Every 24 hours 01/05/20 2251 01/11/20 2234   01/05/20 2200  ceFEPIme (MAXIPIME) 2 g in sodium chloride 0.9 % 100 mL IVPB     2 g 200 mL/hr over 30 Minutes Intravenous  Once 01/05/20 2148 01/06/20 0015   01/05/20 2200  metroNIDAZOLE (FLAGYL) IVPB 500 mg     500 mg 100 mL/hr over 60 Minutes Intravenous  Once 01/05/20 2148 01/06/20 0015   01/05/20 2200  vancomycin (VANCOCIN) IVPB 1000 mg/200 mL premix  Status:  Discontinued     1,000 mg 200 mL/hr over 60 Minutes Intravenous  Once 01/05/20 2148 01/05/20 2151   01/05/20 2200  vancomycin (VANCOREADY) IVPB 2000 mg/400 mL  Status:  Discontinued     2,000 mg 200 mL/hr over 120 Minutes Intravenous  Once 01/05/20 2151 01/05/20 2223     Subjective: Seen and examined at bedside in dialysis and she had no complaints.  She did shake her head yes and no  and denied any pain.  I asked her if she had any problems and she said no.  Give her daughter update.  No other concerns of present this time she is resting in the dialysis session.  Objective: Vitals:   01/20/20 1113 01/20/20 1158 01/20/20 1159 01/20/20 1453  BP: (!) 121/59  (!) 109/96   Pulse: 71 71 71 71  Resp: '16 16  18  '$ Temp: (!) 97.5 F (36.4 C)  97.7 F (36.5 C)   TempSrc: Oral  Oral   SpO2: 100% 97% 100% 100%  Weight: 89 kg  Height:        Intake/Output Summary (Last 24 hours) at 01/20/2020 1708 Last data filed at 01/20/2020 1113 Gross per 24 hour  Intake 556.67 ml  Output 3500 ml  Net -2943.33 ml   Filed Weights   01/20/20 0500 01/20/20 0733 01/20/20 1113  Weight: 94.2 kg 93.2 kg 89 kg   Examination: Physical Exam:  Constitutional: WN/WD obese Caucasian female in NAD and appears calm and comfortable Eyes: Lids and conjunctivae normal, sclerae anicteric  ENMT: External Ears, Nose appear normal. Grossly normal hearing.  Neck: Appears normal, supple, no cervical masses, normal ROM, no appreciable thyromegaly; Tracheostomy in place wearing a trach collar Respiratory: Diminished to auscultation bilaterally, no wheezing, rales, rhonchi or crackles. Normal respiratory effort and patient is not tachypenic. No accessory muscle use. Unlabored breathing on Trach Collar Cardiovascular: RRR, no murmurs / rubs / gallops. S1 and S2 auscultated. 1-2+ Upper and Lower Extermity edema and Left Arm > Right Arm Abdomen: Soft, non-tender, Distended 2/2 body habitus. Bowel sounds positive x4. PEG in place GU: Deferred. Musculoskeletal: No clubbing / cyanosis of digits/nails. No joint deformity upper and lower extremities but is swollen and bruised on the Left arm. Has a Right Tunneled IJ Cathether  Skin: No rashes, lesionsb ut as above has some bruising and ecchymosis on Left Arm. No induration; Warm and dry.  Neurologic: CN 2-12 grossly intact with no focal deficits. Romberg sign and  cerebellar reflexes not assessed.  Psychiatric: Normal judgment and insight. Alert and oriented x 3. Somewhat depressed appearing mood and flat affect.   Data Reviewed: I have personally reviewed following labs and imaging studies  CBC: Recent Labs  Lab 01/16/20 0521 01/17/20 0424 01/18/20 0542 01/19/20 0315 01/20/20 0356  WBC 7.2 8.4 5.5 4.3 4.0  NEUTROABS  --   --   --   --  2.8  HGB 7.6* 7.3* 7.0* 7.1* 7.6*  HCT 25.7* 24.4* 24.0* 24.1* 25.2*  MCV 98.5 98.4 98.0 98.8 97.7  PLT 67* 77* 75* 77* 73*   Basic Metabolic Panel: Recent Labs  Lab 01/16/20 0521 01/16/20 1700 01/17/20 0424 01/17/20 1615 01/18/20 0542 01/18/20 1630 01/19/20 0315 01/19/20 1739 01/20/20 0356  NA 136   < > 136   < > 138 135 137 134* 136  135  K 4.9   < > 5.2*   < > 4.4 4.7 4.6 5.1 5.0  5.0  CL 100   < > 99   < > 99 98 97* 94* 97*  95*  CO2 28   < > 28   < > '30 30 30 28 29  29  '$ GLUCOSE 134*   < > 115*   < > 106* 158* 166* 168* 121*  121*  BUN 35*   < > 60*   < > 41* 55* 63* 76* 86*  87*  CREATININE 0.91   < > 1.69*   < > 1.22* 1.76* 1.96* 2.29* 2.53*  2.42*  CALCIUM 8.7*   < > 8.8*   < > 8.5* 8.2* 8.5* 8.4* 8.5*  8.5*  MG 2.4  --  2.7*  --  2.4  --  2.5*  --  2.6*  PHOS 3.1   < > 4.0   < > 3.9 4.9* 5.8* 6.6* 7.0*  6.9*   < > = values in this interval not displayed.   GFR: Estimated Creatinine Clearance: 23 mL/min (A) (by C-G formula based on SCr of 2.42 mg/dL (H)). Liver Function Tests: Recent Labs  Lab  01/18/20 0542 01/18/20 1630 01/19/20 0315 01/19/20 1739 01/20/20 0356  AST  --   --   --   --  29  ALT  --   --   --   --  23  ALKPHOS  --   --   --   --  198*  BILITOT  --   --   --   --  0.5  PROT  --   --   --   --  5.9*  ALBUMIN 2.3* 2.3* 2.3* 2.4* 2.4*  2.4*   No results for input(s): LIPASE, AMYLASE in the last 168 hours. No results for input(s): AMMONIA in the last 168 hours. Coagulation Profile: No results for input(s): INR, PROTIME in the last 168 hours. Cardiac  Enzymes: No results for input(s): CKTOTAL, CKMB, CKMBINDEX, TROPONINI in the last 168 hours. BNP (last 3 results) No results for input(s): PROBNP in the last 8760 hours. HbA1C: No results for input(s): HGBA1C in the last 72 hours. CBG: Recent Labs  Lab 01/19/20 2354 01/20/20 0358 01/20/20 0722 01/20/20 1200 01/20/20 1608  GLUCAP 137* 113* 132* 141* 149*   Lipid Profile: No results for input(s): CHOL, HDL, LDLCALC, TRIG, CHOLHDL, LDLDIRECT in the last 72 hours. Thyroid Function Tests: No results for input(s): TSH, T4TOTAL, FREET4, T3FREE, THYROIDAB in the last 72 hours. Anemia Panel: No results for input(s): VITAMINB12, FOLATE, FERRITIN, TIBC, IRON, RETICCTPCT in the last 72 hours. Sepsis Labs: No results for input(s): PROCALCITON, LATICACIDVEN in the last 168 hours.  No results found for this or any previous visit (from the past 240 hour(s)).   RN Pressure Injury Documentation:     Estimated body mass index is 33.68 kg/m as calculated from the following:   Height as of this encounter: '5\' 4"'$  (1.626 m).   Weight as of this encounter: 89 kg.  Malnutrition Type:  Nutrition Problem: Inadequate oral intake Etiology: inability to eat   Malnutrition Characteristics:  Signs/Symptoms: NPO status   Nutrition Interventions:  Interventions: Tube feeding   Radiology Studies: DG CHEST PORT 1 VIEW  Result Date: 01/19/2020 CLINICAL DATA:  Short of breath. EXAM: PORTABLE CHEST 1 VIEW COMPARISON:  01/12/2020 FINDINGS: Right chest wall dialysis catheter is noted with tips in the right atrium. Right arm PICC line tip is at the cavoatrial junction. Tracheostomy tube tip is above the carina. Cardiac enlargement and pulmonary vascular congestion. No change in moderate right pleural effusion. IMPRESSION: 1. Persistent bilateral pleural effusions, right greater than left. 2. Pulmonary vascular congestion, unchanged. 3. Stable support apparatus. Electronically Signed   By: Kerby Moors  M.D.   On: 01/19/2020 19:53   VAS [place] UPPER EXTREMITY VENOUS DUPLEX  Result Date: 01/19/2020 UPPER VENOUS STUDY  Indications: Swelling Risk Factors: None identified. Limitations: Bandages, line and Trach, patient positioning, poor patient cooperation. Comparison Study: 01/10/2020 - Negative for DVT. Performing Technologist: Oliver Hum RVT  Examination Guidelines: A complete evaluation includes B-mode imaging, spectral Doppler, color Doppler, and power Doppler as needed of all accessible portions of each vessel. Bilateral testing is considered an integral part of a complete examination. Limited examinations for reoccurring indications may be performed as noted.  Right Findings: +----------+------------+---------+-----------+----------+--------------+ RIGHT     CompressiblePhasicitySpontaneousProperties   Summary     +----------+------------+---------+-----------+----------+--------------+ Subclavian  Not visualized +----------+------------+---------+-----------+----------+--------------+  Left Findings: +----------+------------+---------+-----------+----------+--------------+ LEFT      CompressiblePhasicitySpontaneousProperties   Summary     +----------+------------+---------+-----------+----------+--------------+ IJV                                                 Not visualized +----------+------------+---------+-----------+----------+--------------+ Subclavian    Full       Yes       Yes                             +----------+------------+---------+-----------+----------+--------------+ Axillary      Full       Yes       Yes                             +----------+------------+---------+-----------+----------+--------------+ Brachial      Full       Yes       Yes                             +----------+------------+---------+-----------+----------+--------------+ Radial        Full                                                  +----------+------------+---------+-----------+----------+--------------+ Ulnar         Full                                                 +----------+------------+---------+-----------+----------+--------------+ Cephalic      Full                                                 +----------+------------+---------+-----------+----------+--------------+ Basilic       Full                                                 +----------+------------+---------+-----------+----------+--------------+  Summary:  Left: No evidence of deep vein thrombosis in the upper extremity. No evidence of superficial vein thrombosis in the upper extremity.  *See table(s) above for measurements and observations.  Diagnosing physician: Ruta Hinds MD Electronically signed by Ruta Hinds MD on 01/19/2020 at 7:06:48 PM.    Final    Scheduled Meds: . B-complex with vitamin C  1 tablet Per Tube Daily  . chlorhexidine gluconate (MEDLINE KIT)  15 mL Mouth Rinse BID  . Chlorhexidine Gluconate Cloth  6 each Topical Daily  . Chlorhexidine Gluconate Cloth  6 each Topical Q0600  . darbepoetin (ARANESP) injection - NON-DIALYSIS  100 mcg Subcutaneous Q Mon-1800  . feeding supplement (NEPRO CARB STEADY)  1,000 mL Per Tube Q24H  . feeding supplement (PRO-STAT SUGAR FREE 64)  30 mL Per Tube TID  . heparin  5,000 Units  Subcutaneous Q8H  . insulin aspart  0-15 Units Subcutaneous Q4H  . mouth rinse  15 mL Mouth Rinse 10 times per day  . pantoprazole sodium  40 mg Per Tube Q24H  . PARoxetine  30 mg Per Tube Daily  . sodium chloride flush  10-40 mL Intracatheter Q12H  . traZODone  25 mg Per Tube QHS   Continuous Infusions: . feeding supplement (OSMOLITE 1.5 CAL) 1,000 mL (01/19/20 2147)  . sodium chloride      LOS: 15 days   Kerney Elbe, DO [facility] PAGER is on [place]  If 7PM-7AM, please contact night-coverage [email or web address]

## 2020-01-20 NOTE — Progress Notes (Signed)
Patient back to unit from dialysis

## 2020-01-20 NOTE — Procedures (Signed)
Patient seen and examined on Hemodialysis. BP (!) 125/53   Pulse 67   Temp 97.6 F (36.4 C) (Oral)   Resp 18   Ht 5\' 4"  (1.626 m)   Wt 93.2 kg   SpO2 100%   BMI 35.27 kg/m   QB 400 mL/ min, UF goal 3L  Tolerating treatment without complaints at this time.   [name] MD [facility] pgr [phone] 8:53 AM

## 2020-01-20 NOTE — Progress Notes (Addendum)
Nutrition Follow-up  DOCUMENTATION CODES:   Morbid obesity  INTERVENTION:   -D/c Osmolite 1.5  Initiate Nepro @ 40 ml/hr via PEG  30 ml Prostat TID.    Tube feeding regimen provides 2028 kcal (100% of needs), 123 grams of protein, and 698 ml of H2O.   -B complex with vitamin C  NUTRITION DIAGNOSIS:   Inadequate oral intake related to inability to eat as evidenced by NPO status.  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  3/7 CRRT started 3/11 CRRT stopped  Reviewed I/O's: +842 ml x 24 hours and -14.6 L since 01/06/20  Case discussed with RN, who reports pt has been in HD all AM, but should be returning soon. Per RN, pt tolerating TF well.  Pt remains NPO per SLP recommendations. Continues to receive TF via PEG: Osmolite 1.5 @ 40 ml/hr with 60 ml Prostat BID, which provides 1840 kcals, 120 grams protein, and 731 ml free water daily, meeting 100% of estimated energy and protein needs. Noted increased K and Phos levels.   Per TOC team notes, plan possible discharge to [facility] (due to trach and HD dependent).   Labs reviewed: K: 5.0, Phos: 7.0, CBGS: 113-132 (inpatient orders for glycemic control are 0-15 units insulin aspart every 4 hours).   Diet Order:   Diet Order            Diet NPO time specified  Diet effective now              EDUCATION NEEDS:   No education  needs have been identified at this time  Skin:  Skin Assessment: Reviewed RN Assessment(L groin: open wound/MASD)  Last BM:  01/19/20  Height:   Ht Readings from Last 1 Encounters:  01/05/20 '5\' 4"'$  (1.626 m)    Weight:   Wt Readings from Last 1 Encounters:  01/20/20 89 kg    Ideal Body Weight:  54.5 kg  BMI:  Body mass index is 33.68 kg/m.  Estimated Nutritional Needs:   Kcal:  1700-1900  Protein:  110-136 grams  Fluid:  >1.5 L/day    Loistine Chance, RD, LDN, [place] Registered Dietitian II Certified Diabetes Care and Education Specialist Please refer to [facility] for RD and/or RD on-call/weekend/after hours pager

## 2020-01-20 NOTE — Progress Notes (Signed)
SLP Cancellation Note  Patient Details Name: Bailey Cox MRN: [id number] DOB: 1945-12-16   Cancelled treatment:       Reason Eval/Treat Not Completed: Patient at procedure or test/unavailable(HD)    Osie Bond., M.A. [facility] Pager [phone] Office [phone]  01/20/2020, 10:41 AM

## 2020-01-20 NOTE — Progress Notes (Addendum)
Patient left unit for dialysis via transporter.

## 2020-01-21 ENCOUNTER — Inpatient Hospital Stay (HOSPITAL_COMMUNITY): Payer: Medicare Other

## 2020-01-21 DIAGNOSIS — J9601 Acute respiratory failure with hypoxia: Secondary | ICD-10-CM | POA: Diagnosis not present

## 2020-01-21 DIAGNOSIS — J9611 Chronic respiratory failure with hypoxia: Secondary | ICD-10-CM | POA: Diagnosis not present

## 2020-01-21 DIAGNOSIS — N179 Acute kidney failure, unspecified: Secondary | ICD-10-CM | POA: Diagnosis not present

## 2020-01-21 DIAGNOSIS — N184 Chronic kidney disease, stage 4 (severe): Secondary | ICD-10-CM

## 2020-01-21 DIAGNOSIS — Z930 Tracheostomy status: Secondary | ICD-10-CM | POA: Diagnosis not present

## 2020-01-21 DIAGNOSIS — J9600 Acute respiratory failure, unspecified whether with hypoxia or hypercapnia: Secondary | ICD-10-CM

## 2020-01-21 DIAGNOSIS — D631 Anemia in chronic kidney disease: Secondary | ICD-10-CM

## 2020-01-21 LAB — HEPATIC FUNCTION PANEL
ALT: 21 U/L (ref 0–44)
AST: 27 U/L (ref 15–41)
Albumin: 2.2 g/dL — ABNORMAL LOW (ref 3.5–5.0)
Alkaline Phosphatase: 189 U/L — ABNORMAL HIGH (ref 38–126)
Bilirubin, Direct: <0.1 mg/dL (ref 0.0–0.2)
Total Bilirubin: 0.2 mg/dL — ABNORMAL LOW (ref 0.3–1.2)
Total Protein: 5.7 g/dL — ABNORMAL LOW (ref 6.5–8.1)

## 2020-01-21 LAB — RENAL FUNCTION PANEL
Albumin: 2.3 g/dL — ABNORMAL LOW (ref 3.5–5.0)
Albumin: 2.3 g/dL — ABNORMAL LOW (ref 3.5–5.0)
Anion gap: 10 (ref 5–15)
Anion gap: 11 (ref 5–15)
BUN: 57 mg/dL — ABNORMAL HIGH (ref 8–23)
BUN: 77 mg/dL — ABNORMAL HIGH (ref 8–23)
CO2: 29 mmol/L (ref 22–32)
CO2: 28 mmol/L (ref 22–32)
Calcium: 8.3 mg/dL — ABNORMAL LOW (ref 8.9–10.3)
Calcium: 8.3 mg/dL — ABNORMAL LOW (ref 8.9–10.3)
Chloride: 96 mmol/L — ABNORMAL LOW (ref 98–111)
Chloride: 95 mmol/L — ABNORMAL LOW (ref 98–111)
Creatinine, Ser: 1.7 mg/dL — ABNORMAL HIGH (ref 0.44–1.00)
Creatinine, Ser: 2.09 mg/dL — ABNORMAL HIGH (ref 0.44–1.00)
GFR calc Af Amer: 35 mL/min — ABNORMAL LOW (ref >60)
GFR calc Af Amer: 27 mL/min — ABNORMAL LOW (ref >60)
GFR calc non Af Amer: 30 mL/min — ABNORMAL LOW (ref >60)
GFR calc non Af Amer: 23 mL/min — ABNORMAL LOW (ref >60)
Glucose, Bld: 102 mg/dL — ABNORMAL HIGH (ref 70–99)
Glucose, Bld: 131 mg/dL — ABNORMAL HIGH (ref 70–99)
Phosphorus: 5.1 mg/dL — ABNORMAL HIGH (ref 2.5–4.6)
Phosphorus: 6.1 mg/dL — ABNORMAL HIGH (ref 2.5–4.6)
Potassium: 4.2 mmol/L (ref 3.5–5.1)
Potassium: 4.4 mmol/L (ref 3.5–5.1)
Sodium: 135 mmol/L (ref 135–145)
Sodium: 134 mmol/L — ABNORMAL LOW (ref 135–145)

## 2020-01-21 LAB — GLUCOSE, CAPILLARY
Glucose-Capillary: 95 mg/dL (ref 70–99)
Glucose-Capillary: 115 mg/dL — ABNORMAL HIGH (ref 70–99)
Glucose-Capillary: 137 mg/dL — ABNORMAL HIGH (ref 70–99)
Glucose-Capillary: 99 mg/dL (ref 70–99)
Glucose-Capillary: 134 mg/dL — ABNORMAL HIGH (ref 70–99)

## 2020-01-21 LAB — MAGNESIUM: Magnesium: 2.3 mg/dL (ref 1.7–2.4)

## 2020-01-21 MED ORDER — GUAIFENESIN 100 MG/5ML PO SOLN
10.0000 mL | Freq: Two times a day (BID) | ORAL | Status: AC
  Administered 2020-01-21 – 2020-01-27 (×14): 200 mg
  Filled 2020-01-21 (×16): qty 10

## 2020-01-21 MED ORDER — GENERIC EXTERNAL MEDICATION
Status: DC
Start: ? — End: 2020-01-21

## 2020-01-21 NOTE — Progress Notes (Signed)
NAME:  Bailey Cox, MRN:  [id number], DOB:  [birth year], LOS: 32 ADMISSION DATE:  01/05/2020, CONSULTATION DATE:  01/06/20 REFERRING MD:  [place] EDP, CHIEF COMPLAINT:   Shock  Brief History   74 year old female admitted 3/1 from [facility] where she had just arrived after long admission to [facility] where she suffered PEA arrest during endoscopy and ended up requiring tracheostomy. Presents to [facility] ED with widespread peripheral edema and acidosis.   History of present illness   74 year old female with PMH as below, which is significant for long admission in the [facility] system from 10/2019 all the way through 01/05/2020.  She was originally hospitalized in September 2020 with acute cholecystitis and underwent a cholecystectomy.  She was then sent to SNF for short duration but had return to [facility] to have a bile duct stent placed ultimately returning to SNF.  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  While hospitalized she developed fluid overload and was ultimately transferred to [facility] for dialysis.  Her course after that point become somewhat unclear, but as described by her family she was on and off the ventilator for several procedures and was eventually left intubated.  One of these procedures was an endoscopy during which she unfortunately suffered a cardiac arrest.  Details of the arrest are unclear.  She remained on the ventilator and ultimately underwent tracheostomy.  Her hospital course was also complicated by MRSA pneumonia.  She was ultimately able to come off of hemodialysis and was treated intermittently with diuresis.  It sounds like she bounced in and out of the ICU a few times with volume overload.  On the tail end of her admission she still struggled with kidney disease and was being treated for a urinary tract infection with cefepime and vancomycin.  Plans were to stop on 3/2.  She was discharged from the [facility] system on 3/1 to  [facility] in [place].  Upon arrival to [facility] they deemed her too sick for admission and transferred her to [facility] emergency department with complaints of volume overload and acidosis.   Past Medical History   has a past medical history of Chronic respiratory failure ([place]), CKD (chronic kidney disease), DM (diabetes mellitus) ([place]), GERD (gastroesophageal reflux disease), HTN (hypertension), Ogilvie syndrome, Tracheostomy status ([place]), and Vascular dementia ([place]).  Significant Hospital Events   07/2019 admit to [facility] for cholecystitis  09/2019 tx to [facility] for renal failure/volume overload. HD started.  10/2019 PEA arrest during EGD . Intubated 11/2019 Trach  3/8 Tolerating TCT 3/9-3/12 CRRT 3/14 Tolerating HD. Will transfer to floor  Consults:  Nephrology  [place] [facility]  Procedures:  PICC pre admit > Trach pre admit > 3/5 IJ vas cath->  Significant Diagnostic Tests:  Echo 10/24/2019 > LVEF 55%, Grade 1 DD. RV systolic function normal.  Echo 3/2: LVEF 65-70%, grade II dysfunction. RVSP  Micro Data:  Blood 3/1 >> ng Urine 3/1 >> ng resp 3/4 >> pan-sensitive pseudomonas   Antimicrobials:  Meropenem 2/21 >> off  Cefepime Pre hosp (for tracheitis )  >> 3/1-> 3/7 Vancomycin pre hosp >> 3/2   Interim history/subjective:  Tolerated HD with volume removal yesterday. No complaints this morning. Remains on trach collar.  Objective   Blood pressure 132/78, pulse 78, temperature 98.6 F (37 C), temperature source Oral, resp. rate 17, height 5\' 4"  (1.626 m), weight 89 kg, SpO2 93 %.    FiO2 (%):  [28 %]  28 %   Intake/Output Summary (Last 24 hours) at 01/21/2020 1503 Last data filed at 01/21/2020 0443 Gross per 24 hour  Intake 906 ml  Output --  Net 906 ml   Filed Weights   01/20/20 0500 01/20/20 0733 01/20/20 1113  Weight: 94.2 kg 93.2 kg 89 kg   Physical Exam: GEN: no acute distress HEENT: trach in place minimal secretions CV: RRR, ext warm PULM: Clear,  no wheeezing GI: Soft, +BS EXT: No edema NEURO: moves all 4 ext PSYCH: confused SKIN: No rashes    CBC Latest Ref Rng & Units 01/20/2020 01/19/2020 01/18/2020  WBC 4.0 - 10.5 K/uL 4.0 4.3 5.5  Hemoglobin 12.0 - 15.0 g/dL 7.6(L) 7.1(L) 7.0(L)  Hematocrit 36.0 - 46.0 % 25.2(L) 24.1(L) 24.0(L)  Platelets 150 - 400 K/uL 73(L) 77(L) 75(L)   BMP Latest Ref Rng & Units 01/21/2020 01/20/2020 01/20/2020  Glucose 70 - 99 mg/dL 102(H) 178(H) 121(H)  BUN 8 - 23 mg/dL 57(H) 43(H) 86(H)  Creatinine 0.44 - 1.00 mg/dL 1.70(H) 1.45(H) 2.53(H)  Sodium 135 - 145 mmol/L 135 134(L) 136  Potassium 3.5 - 5.1 mmol/L 4.2 4.1 5.0  Chloride 98 - 111 mmol/L 96(L) 97(L) 97(L)  CO2 22 - 32 mmol/L 29 30 29   Calcium 8.9 - 10.3 mg/dL 8.3(L) 8.0(L) 8.5(L)    Resolved problems:  Metabolic acidosis, Septic shock, Elevated lipase Acute on chronic hypoxic respiratory failure secondary to Pseudomonal HCAP s/p antibiotics x 7d Assessment & Plan:  74 year old female with chronic tracheostomy who was transferred for HCAP and AKI requiring CRRT. Now completed antibiotics and has been started on HD.  Chronic tracheostomy status, placed in 11/2018. Discussed with family who was under the impression she would be decannulated when ready however due to transfer of care from [facility], she seems to have been lost to follow-up. Off vent since 3/08  Fine to downsize to 4 cuffless. If does well with this will cap tomorrow and consider decannulation as early as Friday. Discussed with patient, she did not seem to understand, discussed with daughter who agrees with plan.  Erskine Emery MD PCCM

## 2020-01-21 NOTE — TOC Progression Note (Signed)
Transition of Care [facility]) - Progression Note    Patient Details  Name: Bailey Cox MRN: [id number] Date of Birth: November 03, 1946  Transition of Care [facility]) CM/SW [place], [place] Phone Number: 01/21/2020, 10:58 AM  Clinical Narrative:    Pt does not have any LTAC days left as noted by [facility] on 3/16. Pt will need SNF placement. However at this time has multiple barriers to placement including but not limited to- pt in restraints, has cuffed trach, requiring HD.  Will follow for these barriers to be resolved in order to initiate placement. CSW would encourage ongoing GOC/PMT involvement as appropriate.  Expected Discharge Plan: [place] Barriers to Discharge: Continued Medical Work up, Other (comment), Requiring sitter/restraints(cuffed trach, HD)  Expected Discharge Plan and Services Expected Discharge Plan: [place] Discharge Planning Services: CM Consult Living arrangements for the past 2 months: Post-Acute Facility   Readmission Risk Interventions No flowsheet data found.

## 2020-01-21 NOTE — Progress Notes (Signed)
[facility] Progress Note    74 y.o.femalewho had a prolonged 81 day hospitalization at [facility] with  HTN COPD DM hypothyroidism Ogilvie's syndrome. PEA arrest during EGD in early December.Intermittent hyperkalemia treatet medically and with HD at OSH and now recently w/ worsening renal function over the past few days prior to admission to [facility]. She actually had a foley placed 12/28/19 when the renal function was worsening with return of purulence - started on Meropenem. Per review of chart the pt did not want RRT but after urging by family agreed to it.   Assessment/ Plan:   1. AKI on CKD with most likely cause of the AKI being ATN from sepsis.  - She is not a great candidate for long term dialysis due to trach and unclear how much overall recovery potential after 4 month hosp - [name] [phone] and [name] [phone]; both have POA.  Dr. Augustin Coupe spoke with family and they wanted RRT; actually was on dialysis off and on while at [facility] and due to go to [facility].   Attempted iHD 3/5  -> cath converted by VIR to TC- then started CRRT on 3/7-3/11 for clearance and also for the anasarca/ volume management-   stopped CRRT on 3/11.  Suspect will need ongoing RRT in the form of IHD.  I am just concerned about the endpoint-  With trach if she remains HD dep the only option is LTACH-  She was initially sent to [facility] so that does not appear to be an issue.   Nursing indicates that family is set on aggressive care- have refused DNR.  Planning for IHD to keep on TTS  schedule while here via [place].  Next 3/18. 2. Sepsis -  No longer on pressors -  broad spectrum abx(cefepime and vanc) 3. ?urosepsis -  urine is clearer clinically 4. H/o PEA arrest 5. MRSA PNA on vanc/cefepime  6. Respiratory failure with  trach 7. Htn.vol-  Massive volume overload-  15 liters negative with CRRT-  Will cont UF as able on IHD 8. Anemia-   iron stores OK-  giving ESA - transfuse PRN 9. elytes-  Phos and K  OK  10. Dispo: ? pending  Subjective:     For HD tomorrow.  Getting trach downsized     Objective:   BP (!) 155/69 (BP Location: Left Leg)   Pulse 76   Temp 98.4 F (36.9 C) (Oral)   Resp 17   Ht '5\' 4"'$  (1.626 m)   Wt 89 kg   SpO2 97%   BMI 33.68 kg/m   Intake/Output Summary (Last 24 hours) at 01/21/2020 1613 Last data filed at 01/21/2020 0443 Gross per 24 hour  Intake 906 ml  Output --  Net 906 ml   Weight change: -1 kg  Physical Exam: GEN: NAD, more easily awakened than yesterday, interactive HEENT: No conjunctival pallor, EOMI NECK: Supple, no thyromegaly LUNGS: CTA anteriorly CV: RRR, No M/R/G ABD: NA EXT: dependent and flank edema ACCESS: RIJ TC  Imaging: DG CHEST PORT 1 VIEW  Result Date: 01/21/2020 CLINICAL DATA:  Shortness of breath EXAM: PORTABLE CHEST 1 VIEW COMPARISON:  01/19/2020 FINDINGS: No significant change in AP portable examination with mild, diffuse interstitial pulmonary opacity and small effusions. No new or focal airspace opacity. Tracheostomy. Large-bore right neck multi lumen vascular catheter. Cardiomegaly. IMPRESSION: No significant change in AP portable examination with diffuse interstitial pulmonary opacity and small effusions, consistent with edema. No new  or focal airspace opacity. Electronically Signed   By: Eddie Candle M.D.   On: 01/21/2020 08:58   DG CHEST PORT 1 VIEW  Result Date: 01/19/2020 CLINICAL DATA:  Short of breath. EXAM: PORTABLE CHEST 1 VIEW COMPARISON:  01/12/2020 FINDINGS: Right chest wall dialysis catheter is noted with tips in the right atrium. Right arm PICC line tip is at the cavoatrial junction. Tracheostomy tube tip is above the carina. Cardiac enlargement and pulmonary vascular congestion. No change in moderate right pleural effusion. IMPRESSION: 1. Persistent bilateral pleural effusions, right greater than left. 2. Pulmonary vascular congestion, unchanged. 3. Stable  support apparatus. Electronically Signed   By: Kerby Moors M.D.   On: 01/19/2020 19:53    Labs: BMET Recent Labs  Lab 01/18/20 0542 01/18/20 1630 01/19/20 0315 01/19/20 1739 01/20/20 0356 01/20/20 1846 01/21/20 0421  NA 138 135 137 134* 136  135 134* 135  K 4.4 4.7 4.6 5.1 5.0  5.0 4.1 4.2  CL 99 98 97* 94* 97*  95* 97* 96*  CO2 '30 30 30 28 29  29 30 29  '$ GLUCOSE 106* 158* 166* 168* 121*  121* 178* 102*  BUN 41* 55* 63* 76* 86*  87* 43* 57*  CREATININE 1.22* 1.76* 1.96* 2.29* 2.53*  2.42* 1.45* 1.70*  CALCIUM 8.5* 8.2* 8.5* 8.4* 8.5*  8.5* 8.0* 8.3*  PHOS 3.9 4.9* 5.8* 6.6* 7.0*  6.9* 4.2 5.1*   CBC Recent Labs  Lab 01/17/20 0424 01/18/20 0542 01/19/20 0315 01/20/20 0356  WBC 8.4 5.5 4.3 4.0  NEUTROABS  --   --   --  2.8  HGB 7.3* 7.0* 7.1* 7.6*  HCT 24.4* 24.0* 24.1* 25.2*  MCV 98.4 98.0 98.8 97.7  PLT 77* 75* 77* 73*    Medications:    . B-complex with vitamin C  1 tablet Per Tube Daily  . chlorhexidine gluconate (MEDLINE KIT)  15 mL Mouth Rinse BID  . Chlorhexidine Gluconate Cloth  6 each Topical Daily  . Chlorhexidine Gluconate Cloth  6 each Topical Q0600  . darbepoetin (ARANESP) injection - NON-DIALYSIS  100 mcg Subcutaneous Q Mon-1800  . feeding supplement (NEPRO CARB STEADY)  1,000 mL Per Tube Q24H  . feeding supplement (PRO-STAT SUGAR FREE 64)  30 mL Per Tube TID  . guaiFENesin  10 mL Per Tube BID  . heparin  5,000 Units Subcutaneous Q8H  . insulin aspart  0-15 Units Subcutaneous Q4H  . mouth rinse  15 mL Mouth Rinse 10 times per day  . pantoprazole sodium  40 mg Per Tube Q24H  . PARoxetine  30 mg Per Tube Daily  . sodium chloride flush  10-40 mL Intracatheter Q12H  . traZODone  25 mg Per Tube QHS      Bailey Cox  01/21/2020, 4:13 PM

## 2020-01-21 NOTE — Procedures (Signed)
Tracheostomy Change Note  Patient Details:   Name: Bailey Cox DOB: June 23, 1946 MRN: [id number]    Airway Documentation:     Evaluation  O2 sats: stable throughout Complications: No apparent complications Patient did tolerate procedure well. Bilateral Breath Sounds: Clear, Diminished   Patient trach changed from #6 cuffed shiley to #4 cuffless shiley by MD with RT at bedside.  Positive color change noted.  Bilateral breath sounds auscultated.  Sats currently 100%.  Vitals are stable.  No complications noted.   Judith Part 01/21/2020, 5:07 PM

## 2020-01-21 NOTE — Progress Notes (Signed)
PT Cancellation Note  Patient Details Name: Robynne Roat MRN: [id number] DOB: 31-Mar-1946   Cancelled Treatment:    Reason Eval/Treat Not Completed: Patient declined, no reason specified.   I don't want to... get out. 01/21/2020  Ginger Carne., PT Acute Rehabilitation Services [phone]  (pager) [phone]  (office)   Tessie Fass Adeoluwa Silvers 01/21/2020, 11:56 AM

## 2020-01-21 NOTE — Progress Notes (Signed)
PROGRESS NOTE    Bailey Cox  UEA:[id number] DOB: 12/03/1945 DOA: 01/05/2020 PCP: Patient, No Pcp Per   Brief Narrative:  The patient is a 74 year old obese Caucasian female who was admitted on 01/05/2020 from [facility] where she had just arrived after a long admission to [facility] where she was hospitalized from 10/2019 up until 01/05/2020 where she suffered a PEA arrest during endoscopy and ended up requiring a tracheostomy.  She presented to [facility] with widespread peripheral edema and acidosis.    Originally she was hospitalized in September 2020 with acute cholecystitis and cholecystectomy.  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  While hospitalized she developed fluid overload and ultimately transferred to [facility] for dialysis.  Her clinical course after that point became somewhat unclear but family described that she was on a ventilator for several procedures and was eventually left intubated.  1 of those procedures and endoscopy which she unfortunately suffered a cardiac arrest.  Again details of the cardiac arrest or not clear but she remained on the ventilator and ultimately underwent a tracheostomy.  At that time her hospital course was also complicated by MRSA pneumonia.  She is ultimately able to come off of hemodialysis and treated with intermittent diuresis.  During her hospital stay she bounced in and out of the ICU for a few times with volume overload and at the end of her admission at [facility] she still struggled with kidney disease and was being treated for urinary tract infection with vancomycin and cefepime.  Plans were stopped this on 3 2.  She was discharged on the [facility] system on [facility] [place] upon arrival to [facility] they deemed her too sick for admission transferred her directly to [facility] emergency department with complaints of volume overload and acidosis.   Since then she has been in ICU and has been off of the ventilator and now on trach collar.  She underwent CRRT from 3 9 until 312 and transferred to the floor on 3/14 after she was tolerating hemodialysis.  Currently nephrology has been consulted and they feel that she has an AKI on CKD most likely cause of her AKI being from ATN from sepsis and family still wants aggressive measures so she was on CRRT from 3 7 until 311 for clearance and also for anasarca and volume management.  Her CRRT was stopped on 311 and Dr. Moshe Cipro suspected that she will likely require renal replacement therapy in the setting of intermittent hemodialysis.  They feel that if she has a trach and is dialysis dependent the only option would be send her to LTAC.  The plan is for intermittent hemodialysis schedule on Tuesday Thursdays and Saturdays here via [facility].  Placement is likely going to be an issue because of this.    PT OT recommending SNF at this time.  Patient with no days left for LTAC.   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 Tracheostomy placed 11/25/2018.  Per PCCM they discussed with family who was under the impression patient be decannulated when ready however due to transfer of care from [facility] patient was lost to follow-up. patient  admitted for anasarca and acute respiratory failure had to be placed on the vent as well as dialyzed with some clinical improvement and placed back on trach collar.  Patient currently rhonchorous sounding on examination.  Status post IV antibiotics of cefepime and vancomycin.  Patient off ventilator since 01/12/2020.  Placed on Mucinex twice daily.  Speech therapy consulted for use of Passy-Muir valve.  Chest PT.  Pulmonary following and  appreciate input and recommendations.  2.  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  Pulmonary following and feel patient is not a great candidate for long-term dialysis due to trach.  Dr. Augustin Coupe had spoken with family and they wanted RRT.  Patient was on dialysis off and on while at [facility] and due to get dialysis while at [place].  Patient noted to have intermittent HD on 01/09/2020.  Patient started on CRRT on 372 01/15/2020 for clearance and also for anasarca/volume management.  CRRT was stopped on 01/15/2020.  Nephrology following and planning for intermittent HD to keep on Tuesday Thursday Saturday schedule while in-house versus TDC.  Next dialysis 01/22/2020 per nephrology.  3.  Well-controlled diabetes type 2 CBG of 115 this morning.  5.7.  Continue sliding scale insulin.  Follow.  4.  Anemia of critical illness/anemia of chronic disease/thrombocytopenia Patient with no overt bleeding.  Hemoglobin currently at 7.6.  Platelet count at 73.  Anemia panel with iron of 81, ferritin of 886.  Patient receiving Aranesp per nephrology.  Follow H&H.  Transfusion threshold hemoglobin less than 7 which would likely need to be done in hemodialysis if needed.  5.  Hyponatremia Improved with hemodialysis.  Follow.  6.  Anxiety Continue Paxil at half home dose and alprazolam 3 times daily as needed.  Trazodone per tube nightly.  7.  Gastroesophageal reflux disease/ PPI.  8.  Left upper extremity swelling In the setting of anasarca.  Improved.  Upper extremity duplex negative for DVT.  Follow.  9.  Obesity    DVT prophylaxis: Heparin Code Status: Full Family Communication: Updated patient.  No family at bedside. Disposition Plan:  . Patient came from: [facility]            . Anticipated d/c place: SNF . Barriers to d/c OR conditions which need to be met to effect a safe d/c: Patient was in the ICU for 14 days status post  tracheostomy required intermittent dialysis.  Patient has been refused for [facility] as well as select specialty LTAC.  Likely to skilled nursing facility however will need to have a cuffless trach and be able to sit up for dialysis so difficult placement at this time.   Consultants:   Nephrology: Dr. Augustin Coupe 01/06/2020  Wound care  Entered interventional radiology  Procedures:  PICC pre admit > Trach pre admit > 3/5 IJ vas cath-> Left upper extremity Doppler negative for DVT 01/19/2020  Significant Diagnostic Tests:  Echo 10/24/2019>LVEF 55%, Grade 1 DD. RV systolic function normal.  Echo 3/2: LVEF 65-70%, grade II dysfunction. RVSP   Antimicrobials:  Meropenem 2/21 >>off  Cefepime Pre hosp (for tracheitis ) >>3/1->3/7 Vancomycin pre hosp >>3/2  IV vancomycin 01/10/2020>>>> 01/13/2020    Micro Data:  Blood 3/1 >> ng Urine 3/1 >> ng resp 3/4 >>pan-sensitivepseudomonas    Echo 10/24/2019>LVEF 55%, Grade 1 DD. RV systolic function normal.  Echo 3/2: LVEF 65-70%, grade II dysfunction. RVSP     Subjective: Patient sleeping.  Easily arousable.  Denies any chest pain.  Some coarse breath sounds.  Objective: Vitals:   01/21/20 0049 01/21/20 0346 01/21/20 0520 01/21/20 0906  BP:   132/78   Pulse: 76 71 76 76  Resp: '18 16 17 20  '$ Temp:   98.6 F (37 C)   TempSrc:   Oral   SpO2: 100% 96% 96% 92%  Weight:      Height:        Intake/Output Summary (Last 24 hours) at 01/21/2020 1204 Last data filed at 01/21/2020 0443 Gross per 24 hour  Intake 906 ml  Output --  Net 906 ml   Filed Weights   01/20/20 0500 01/20/20 0733 01/20/20 1113  Weight: 94.2 kg 93.2 kg 89 kg    Examination:  General exam: NAD Respiratory system: Coarse rhonchorous breath sounds anterior lung fields.  No wheezing.  Normal respiratory effort.  Trach in place. Cardiovascular system: Regular rate rhythm no murmurs rubs or gallops.  No JVD.  No lower extremity edema.  Gastrointestinal  system: Abdomen is soft, nontender, nondistended, positive bowel sounds.  No rebound.  No guarding. Central nervous system: Alert and oriented. No focal neurological deficits. Extremities: Symmetric 5 x 5 power. Skin: No rashes, lesions or ulcers Psychiatry: Judgement and insight appear normal. Mood & affect appropriate.     Data Reviewed: I have personally reviewed following labs and imaging studies  CBC: Recent Labs  Lab 01/16/20 0521 01/17/20 0424 01/18/20 0542 01/19/20 0315 01/20/20 0356  WBC 7.2 8.4 5.5 4.3 4.0  NEUTROABS  --   --   --   --  2.8  HGB 7.6* 7.3* 7.0* 7.1* 7.6*  HCT 25.7* 24.4* 24.0* 24.1* 25.2*  MCV 98.5 98.4 98.0 98.8 97.7  PLT 67* 77* 75* 77* 73*   Basic Metabolic Panel: Recent Labs  Lab 01/17/20 0424 01/17/20 1615 01/18/20 0542 01/18/20 1630 01/19/20 0315 01/19/20 1739 01/20/20 0356 01/20/20 1846 01/21/20 0421  NA 136   < > 138   < > 137 134* 136  135 134* 135  K 5.2*   < > 4.4   < > 4.6 5.1 5.0  5.0 4.1 4.2  CL 99   < > 99   < > 97* 94* 97*  95* 97* 96*  CO2 28   < > 30   < > '30 28 29  29 30 29  '$ GLUCOSE 115*   < > 106*   < > 166* 168* 121*  121* 178* 102*  BUN 60*   < > 41*   < > 63* 76* 86*  87* 43* 57*  CREATININE 1.69*   < > 1.22*   < > 1.96* 2.29* 2.53*  2.42* 1.45* 1.70*  CALCIUM 8.8*   < > 8.5*   < > 8.5* 8.4* 8.5*  8.5* 8.0* 8.3*  MG 2.7*  --  2.4  --  2.5*  --  2.6*  --  2.3  PHOS 4.0   < > 3.9   < > 5.8* 6.6* 7.0*  6.9* 4.2 5.1*   < > = values in this interval not displayed.   GFR: Estimated Creatinine Clearance: 32.8 mL/min (A) (by C-G formula based on SCr of 1.7 mg/dL (H)). Liver Function Tests: Recent Labs  Lab 01/19/20 0315 01/19/20 1739 01/20/20 0356 01/20/20 1846 01/21/20 0421  AST  --   --  29  --  27  ALT  --   --  23  --  21  ALKPHOS  --   --  198*  --  189*  BILITOT  --   --  0.5  --  0.2*  PROT  --   --  5.9*  --  5.7*  ALBUMIN 2.3* 2.4* 2.4*  2.4* 2.3* 2.2*  2.3*   No results for input(s):  LIPASE, AMYLASE in the last 168 hours. No results for input(s): AMMONIA in the last 168 hours. Coagulation Profile: No results for input(s): INR, PROTIME in the last 168 hours. Cardiac Enzymes: No results for input(s): CKTOTAL, CKMB, CKMBINDEX, TROPONINI in the last 168 hours. BNP (last 3 results) No results for input(s): PROBNP in the last 8760 hours. HbA1C: No results for input(s): HGBA1C in the last 72 hours. CBG: Recent Labs  Lab 01/20/20 1955 01/20/20 2340 01/21/20 0407 01/21/20 0738 01/21/20 1147  GLUCAP 157* 132* 95 115* 137*   Lipid Profile: No results for input(s): CHOL, HDL, LDLCALC, TRIG, CHOLHDL, LDLDIRECT in the last 72 hours. Thyroid Function Tests: No results for input(s): TSH, T4TOTAL, FREET4, T3FREE, THYROIDAB in the last 72 hours. Anemia Panel: No results for input(s): VITAMINB12, FOLATE, FERRITIN, TIBC, IRON, RETICCTPCT in the last 72 hours. Sepsis Labs: No results for input(s): PROCALCITON, LATICACIDVEN in the last 168 hours.  No results found for this or any previous visit (from the past 240 hour(s)).       Radiology Studies: DG CHEST PORT 1 VIEW  Result Date: 01/21/2020 CLINICAL DATA:  Shortness of breath EXAM: PORTABLE CHEST 1 VIEW COMPARISON:  01/19/2020 FINDINGS: No significant change in AP portable examination with mild, diffuse interstitial pulmonary opacity and small effusions. No new or focal airspace opacity. Tracheostomy. Large-bore right neck multi lumen vascular catheter. Cardiomegaly. IMPRESSION: No significant change in AP portable examination with diffuse interstitial pulmonary opacity and small effusions, consistent with edema. No new or focal airspace opacity. Electronically Signed   By: Eddie Candle M.D.   On: 01/21/2020 08:58   DG CHEST PORT 1 VIEW  Result Date: 01/19/2020 CLINICAL DATA:  Short of breath. EXAM: PORTABLE CHEST 1 VIEW COMPARISON:  01/12/2020 FINDINGS: Right chest wall dialysis catheter is noted with tips in the right  atrium. Right arm PICC line tip is at the cavoatrial junction. Tracheostomy tube tip is above the carina. Cardiac enlargement and pulmonary vascular congestion. No change in moderate right pleural effusion. IMPRESSION: 1. Persistent bilateral pleural effusions, right greater than left. 2. Pulmonary vascular congestion, unchanged. 3. Stable support apparatus. Electronically Signed   By: Kerby Moors M.D.   On: 01/19/2020 19:53   VAS [place] UPPER EXTREMITY VENOUS DUPLEX  Result Date: 01/19/2020 UPPER VENOUS STUDY  Indications: Swelling Risk Factors: None identified. Limitations: Bandages, line and Trach, patient positioning, poor patient cooperation. Comparison Study: 01/10/2020 - Negative for DVT. Performing Technologist: Oliver Hum RVT  Examination Guidelines: A complete evaluation includes B-mode imaging, spectral Doppler, color Doppler, and power Doppler as needed of all accessible portions of each vessel. Bilateral testing is considered an integral part of a complete examination. Limited examinations for reoccurring indications may be performed as noted.  Right Findings: +----------+------------+---------+-----------+----------+--------------+ RIGHT     CompressiblePhasicitySpontaneousProperties   Summary     +----------+------------+---------+-----------+----------+--------------+ Subclavian                                          Not visualized +----------+------------+---------+-----------+----------+--------------+  Left Findings: +----------+------------+---------+-----------+----------+--------------+ LEFT      CompressiblePhasicitySpontaneousProperties   Summary     +----------+------------+---------+-----------+----------+--------------+ IJV  Not visualized +----------+------------+---------+-----------+----------+--------------+ Subclavian    Full       Yes       Yes                              +----------+------------+---------+-----------+----------+--------------+ Axillary      Full       Yes       Yes                             +----------+------------+---------+-----------+----------+--------------+ Brachial      Full       Yes       Yes                             +----------+------------+---------+-----------+----------+--------------+ Radial        Full                                                 +----------+------------+---------+-----------+----------+--------------+ Ulnar         Full                                                 +----------+------------+---------+-----------+----------+--------------+ Cephalic      Full                                                 +----------+------------+---------+-----------+----------+--------------+ Basilic       Full                                                 +----------+------------+---------+-----------+----------+--------------+  Summary:  Left: No evidence of deep vein thrombosis in the upper extremity. No evidence of superficial vein thrombosis in the upper extremity.  *See table(s) above for measurements and observations.  Diagnosing physician: Ruta Hinds MD Electronically signed by Ruta Hinds MD on 01/19/2020 at 7:06:48 PM.    Final         Scheduled Meds: . B-complex with vitamin C  1 tablet Per Tube Daily  . chlorhexidine gluconate (MEDLINE KIT)  15 mL Mouth Rinse BID  . Chlorhexidine Gluconate Cloth  6 each Topical Daily  . Chlorhexidine Gluconate Cloth  6 each Topical Q0600  . darbepoetin (ARANESP) injection - NON-DIALYSIS  100 mcg Subcutaneous Q Mon-1800  . feeding supplement (NEPRO CARB STEADY)  1,000 mL Per Tube Q24H  . feeding supplement (PRO-STAT SUGAR FREE 64)  30 mL Per Tube TID  . heparin  5,000 Units Subcutaneous Q8H  . insulin aspart  0-15 Units Subcutaneous Q4H  . mouth rinse  15 mL Mouth Rinse 10 times per day  . pantoprazole sodium  40 mg Per Tube Q24H   . PARoxetine  30 mg Per Tube Daily  . sodium chloride flush  10-40 mL Intracatheter Q12H  . traZODone  25 mg Per Tube QHS   Continuous  Infusions: . sodium chloride       LOS: 16 days    Time spent: 35 minutes.    Irine Seal, MD [facility]   To contact the attending provider between 7A-7P or the covering provider during after hours 7P-7A, please log into the web site [email or web address] and access using universal [place] password for that web site. If you do not have the password, please call the hospital operator.  01/21/2020, 12:04 PM

## 2020-01-22 DIAGNOSIS — N179 Acute kidney failure, unspecified: Secondary | ICD-10-CM | POA: Diagnosis not present

## 2020-01-22 DIAGNOSIS — J9601 Acute respiratory failure with hypoxia: Secondary | ICD-10-CM | POA: Diagnosis not present

## 2020-01-22 DIAGNOSIS — J9611 Chronic respiratory failure with hypoxia: Secondary | ICD-10-CM | POA: Diagnosis not present

## 2020-01-22 DIAGNOSIS — Z930 Tracheostomy status: Secondary | ICD-10-CM | POA: Diagnosis not present

## 2020-01-22 LAB — RENAL FUNCTION PANEL
Albumin: 2.3 g/dL — ABNORMAL LOW (ref 3.5–5.0)
Albumin: 2.4 g/dL — ABNORMAL LOW (ref 3.5–5.0)
Anion gap: 9 (ref 5–15)
Anion gap: 9 (ref 5–15)
BUN: 88 mg/dL — ABNORMAL HIGH (ref 8–23)
BUN: 32 mg/dL — ABNORMAL HIGH (ref 8–23)
CO2: 30 mmol/L (ref 22–32)
CO2: 29 mmol/L (ref 22–32)
Calcium: 8.4 mg/dL — ABNORMAL LOW (ref 8.9–10.3)
Calcium: 8.3 mg/dL — ABNORMAL LOW (ref 8.9–10.3)
Chloride: 96 mmol/L — ABNORMAL LOW (ref 98–111)
Chloride: 96 mmol/L — ABNORMAL LOW (ref 98–111)
Creatinine, Ser: 2.29 mg/dL — ABNORMAL HIGH (ref 0.44–1.00)
Creatinine, Ser: 1.27 mg/dL — ABNORMAL HIGH (ref 0.44–1.00)
GFR calc Af Amer: 24 mL/min — ABNORMAL LOW (ref >60)
GFR calc Af Amer: 49 mL/min — ABNORMAL LOW (ref >60)
GFR calc non Af Amer: 21 mL/min — ABNORMAL LOW (ref >60)
GFR calc non Af Amer: 42 mL/min — ABNORMAL LOW (ref >60)
Glucose, Bld: 105 mg/dL — ABNORMAL HIGH (ref 70–99)
Glucose, Bld: 129 mg/dL — ABNORMAL HIGH (ref 70–99)
Phosphorus: 6.6 mg/dL — ABNORMAL HIGH (ref 2.5–4.6)
Phosphorus: 3.6 mg/dL (ref 2.5–4.6)
Potassium: 4.3 mmol/L (ref 3.5–5.1)
Potassium: 4.2 mmol/L (ref 3.5–5.1)
Sodium: 135 mmol/L (ref 135–145)
Sodium: 134 mmol/L — ABNORMAL LOW (ref 135–145)

## 2020-01-22 LAB — GLUCOSE, CAPILLARY
Glucose-Capillary: 118 mg/dL — ABNORMAL HIGH (ref 70–99)
Glucose-Capillary: 127 mg/dL — ABNORMAL HIGH (ref 70–99)
Glucose-Capillary: 129 mg/dL — ABNORMAL HIGH (ref 70–99)
Glucose-Capillary: 88 mg/dL (ref 70–99)
Glucose-Capillary: 95 mg/dL (ref 70–99)

## 2020-01-22 LAB — CBC WITH DIFFERENTIAL/PLATELET
Abs Immature Granulocytes: 0.02 10*3/uL (ref 0.00–0.07)
Basophils Absolute: 0 10*3/uL (ref 0.0–0.1)
Basophils Relative: 1 %
Eosinophils Absolute: 0.1 10*3/uL (ref 0.0–0.5)
Eosinophils Relative: 3 %
HCT: 25.8 % — ABNORMAL LOW (ref 36.0–46.0)
Hemoglobin: 7.7 g/dL — ABNORMAL LOW (ref 12.0–15.0)
Immature Granulocytes: 1 %
Lymphocytes Relative: 15 %
Lymphs Abs: 0.5 10*3/uL — ABNORMAL LOW (ref 0.7–4.0)
MCH: 28.8 pg (ref 26.0–34.0)
MCHC: 29.8 g/dL — ABNORMAL LOW (ref 30.0–36.0)
MCV: 96.6 fL (ref 80.0–100.0)
Monocytes Absolute: 0.3 10*3/uL (ref 0.1–1.0)
Monocytes Relative: 10 %
Neutro Abs: 2.6 10*3/uL (ref 1.7–7.7)
Neutrophils Relative %: 70 %
Platelets: 68 10*3/uL — ABNORMAL LOW (ref 150–400)
RBC: 2.67 MIL/uL — ABNORMAL LOW (ref 3.87–5.11)
RDW: 18.6 % — ABNORMAL HIGH (ref 11.5–15.5)
WBC: 3.6 10*3/uL — ABNORMAL LOW (ref 4.0–10.5)
nRBC: 0 % (ref 0.0–0.2)

## 2020-01-22 LAB — MAGNESIUM: Magnesium: 2.5 mg/dL — ABNORMAL HIGH (ref 1.7–2.4)

## 2020-01-22 MED ORDER — HEPARIN SODIUM (PORCINE) 1000 UNIT/ML IJ SOLN
INTRAMUSCULAR | Status: AC
  Administered 2020-01-22: 11:00:00 3800 [IU]
  Filled 2020-01-22: qty 4

## 2020-01-22 NOTE — Progress Notes (Signed)
SLP Cancellation Note  Patient Details Name: Bailey Cox MRN: [id number] DOB: 1946/10/15   Cancelled treatment:       Reason Eval/Treat Not Completed: Other (comment). PT now with capped trach, tolerating. Contrary to some notes, SLP has not had orders to assess pts swallowing and as far as I can tell pt has not had PO for a very long time. When awakened, pt participate in conversation for a few phrases, but then stopped responding other than shaking her head no. She replied no to an offer to check her swallowing and showed no interest in discussing oral intake. If eval is desired, please order. Otherwise, SLP will sign off for PMSV.    Jalene Lacko, Katherene Ponto 01/22/2020, 2:59 PM

## 2020-01-22 NOTE — Progress Notes (Signed)
PROGRESS NOTE    Bailey Cox  ZJI:[id number] DOB: May 07, 1946 DOA: 01/05/2020 PCP: Patient, No Pcp Per   Brief Narrative:  The patient is a 74 year old obese Caucasian female who was admitted on 01/05/2020 from [facility] where she had just arrived after a long admission to [facility] where she was hospitalized from 10/2019 up until 01/05/2020 where she suffered a PEA arrest during endoscopy and ended up requiring a tracheostomy.  She presented to [facility] with widespread peripheral edema and acidosis.    Originally she was hospitalized in September 2020 with acute cholecystitis and cholecystectomy.  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  While hospitalized she developed fluid overload and ultimately transferred to [facility] for dialysis.  Her clinical course after that point became somewhat unclear but family described that she was on a ventilator for several procedures and was eventually left intubated.  1 of those procedures and endoscopy which she unfortunately suffered a cardiac arrest.  Again details of the cardiac arrest or not clear but she remained on the ventilator and ultimately underwent a tracheostomy.  At that time her hospital course was also complicated by MRSA pneumonia.  She is ultimately able to come off of hemodialysis and treated with intermittent diuresis.  During her hospital stay she bounced in and out of the ICU for a few times with volume overload and at the end of her admission at [facility] she still struggled with kidney disease and was being treated for urinary tract infection with vancomycin and cefepime.  Plans were stopped this on 3 2.  She was discharged on the [facility] system on [facility] in [place] upon arrival to [facility] they deemed her too sick for admission transferred her directly to [facility] emergency department with complaints of volume overload and acidosis.   Since then she has been in ICU and has been off of the ventilator and now on trach collar.  She underwent CRRT from 3 9 until 312 and transferred to the floor on 3/14 after she was tolerating hemodialysis.  Currently nephrology has been consulted and they feel that she has an AKI on CKD most likely cause of her AKI being from ATN from sepsis and family still wants aggressive measures so she was on CRRT from 3 7 until 311 for clearance and also for anasarca and volume management.  Her CRRT was stopped on 311 and Dr. Moshe Cipro suspected that she will likely require renal replacement therapy in the setting of intermittent hemodialysis.  They feel that if she has a trach and is dialysis dependent the only option would be send her to LTAC.  The plan is for intermittent hemodialysis schedule on Tuesday Thursdays and Saturdays here via [facility].  Placement is likely going to be an issue because of this.    PT OT recommending SNF at this time.  Patient with no days left for LTAC.   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 Tracheostomy placed 11/25/2018.  Per PCCM they discussed with family  who was under the impression patient be decannulated when ready however due to transfer of care from [facility] patient was lost to follow-up. patient admitted for anasarca and acute respiratory failure had to be placed on the vent as well as dialyzed with some clinical improvement and placed back on trach collar.  Patient currently rhonchorous sounding on examination.  Status post IV antibiotics of cefepime and vancomycin.  Patient off ventilator since  01/12/2020.  Placed on Mucinex twice daily.  Speech therapy consulted for use of Passy-Muir valve.  Chest PT.  Pulmonary following and managing capping trach today and placing on nasal cannula with potential decannulation in the morning.   2.  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  Pulmonary following and feel patient is not a great candidate for long-term dialysis due to trach.  Dr. Augustin Coupe had spoken with family and they wanted RRT.  Patient was on dialysis off and on while at [facility] and due to get dialysis while at [place].  Patient noted to have intermittent HD on 01/09/2020.  Patient started on CRRT on 372 01/15/2020 for clearance and also for anasarca/volume management.  CRRT was stopped on 01/15/2020.  Nephrology following and planning for intermittent HD to keep on Tuesday Thursday Saturday schedule while in-house versus TDC.  Next dialysis today 01/22/2020 per nephrology.  3.  Well-controlled diabetes type 2 CBG of 95 this morning.  Hemoglobin A1c 5.7.  Continue sliding scale insulin.  Follow.  4.  Anemia of critical illness/anemia of chronic disease/thrombocytopenia Patient with no overt bleeding.  Hemoglobin currently at 7.7.  Platelet count at 73.  Anemia panel with iron of 81, ferritin of 886.  Patient receiving Aranesp per nephrology.  Follow H&H.  Transfusion threshold hemoglobin < 7 which would likely need to be done in hemodialysis if needed.  5.  Hyponatremia Improved with hemodialysis.  Follow.  6.  Anxiety Continue current dose of Paxil at half home dose and alprazolam 3 times daily as needed.  Trazodone per tube nightly.  7.  Gastroesophageal reflux disease/ Continue PPI.  8.  Left upper extremity swelling In the setting of anasarca.  Improved.  Upper extremity duplex negative for DVT.  Follow.  9.  Obesity  10.  Nutrition Patient started on Nephro-Vite 40 cc/h via PEG.  Continue 30 cc prostat 3 times  daily.  Dietitian following.    DVT prophylaxis: Heparin Code Status: Full Family Communication: Updated patient.  No family at bedside. Disposition Plan:  . Patient came from: [facility]            . Anticipated d/c place: SNF . Barriers to d/c OR conditions which need to be met to effect a safe d/c: Patient was in the ICU for 14 days status post tracheostomy required intermittent dialysis.  Patient has been refused for [facility] as well as select specialty LTAC.  Likely to skilled nursing facility however will need to have a cuffless trach and be able to sit up for dialysis so difficult placement at this time.   Consultants:   Nephrology: Dr. Augustin Coupe 01/06/2020  Wound care  Entered interventional radiology  Procedures:  PICC pre admit > Trach pre admit > 3/5 IJ vas cath-> Left upper extremity Doppler negative for DVT 01/19/2020  Significant Diagnostic Tests:  Echo 10/24/2019>LVEF 55%, Grade 1 DD. RV systolic function normal.  Echo 3/2: LVEF 65-70%, grade II dysfunction. RVSP   Antimicrobials:  Meropenem 2/21 >>off  Cefepime  Pre hosp (for tracheitis ) >>3/1->3/7 Vancomycin pre hosp >>3/2  IV vancomycin 01/10/2020>>>> 01/13/2020    Micro Data:  Blood 3/1 >> ng Urine 3/1 >> ng resp 3/4 >>pan-sensitivepseudomonas    Echo 10/24/2019>LVEF 55%, Grade 1 DD. RV systolic function normal.  Echo 3/2: LVEF 65-70%, grade II dysfunction. RVSP     Subjective: Patient sleeping however easily arousable.  States she is feeling better.  Denies any chest pain or shortness of breath.    Objective: Vitals:   01/22/20 0930 01/22/20 1006 01/22/20 1122 01/22/20 1221  BP: (!) 110/56 (!) 115/57 (!) 138/59   Pulse: 63 65 73   Resp: '16 18 18   '$ Temp:   97.8 F (36.6 C)   TempSrc:   Axillary   SpO2:   97% 100%  Weight:      Height:        Intake/Output Summary (Last 24 hours) at 01/22/2020 1315 Last data filed at 01/22/2020 1100 Gross per 24 hour  Intake 491.33 ml    Output --  Net 491.33 ml   Filed Weights   01/20/20 0733 01/20/20 1113 01/22/20 0655  Weight: 93.2 kg 89 kg 97.6 kg    Examination:  General exam: NAD Respiratory system: Lungs clear to auscultation bilaterally anterior lung fields.  No wheezes, no crackles, no rhonchi.  Trach in place.   Cardiovascular system: RRR no murmurs rubs or gallops.  No JVD.  No lower extremity edema.  Gastrointestinal system: Abdomen is nontender, nondistended, soft, positive bowel sounds.  No rebound.  No guarding.  Central nervous system: Alert and oriented. No focal neurological deficits. Extremities: Symmetric 5 x 5 power. Skin: No rashes, lesions or ulcers Psychiatry: Judgement and insight appear normal. Mood & affect appropriate.     Data Reviewed: I have personally reviewed following labs and imaging studies  CBC: Recent Labs  Lab 01/16/20 0521 01/17/20 0424 01/18/20 0542 01/19/20 0315 01/20/20 0356  WBC 7.2 8.4 5.5 4.3 4.0  NEUTROABS  --   --   --   --  2.8  HGB 7.6* 7.3* 7.0* 7.1* 7.6*  HCT 25.7* 24.4* 24.0* 24.1* 25.2*  MCV 98.5 98.4 98.0 98.8 97.7  PLT 67* 77* 75* 77* 73*   Basic Metabolic Panel: Recent Labs  Lab 01/18/20 0542 01/18/20 1630 01/19/20 0315 01/19/20 1739 01/20/20 0356 01/20/20 1846 01/21/20 0421 01/21/20 1918 01/22/20 0339  NA 138   < > 137   < > 136  135 134* 135 134* 135  K 4.4   < > 4.6   < > 5.0  5.0 4.1 4.2 4.4 4.3  CL 99   < > 97*   < > 97*  95* 97* 96* 95* 96*  CO2 30   < > 30   < > '29  29 30 29 28 30  '$ GLUCOSE 106*   < > 166*   < > 121*  121* 178* 102* 131* 105*  BUN 41*   < > 63*   < > 86*  87* 43* 57* 77* 88*  CREATININE 1.22*   < > 1.96*   < > 2.53*  2.42* 1.45* 1.70* 2.09* 2.29*  CALCIUM 8.5*   < > 8.5*   < > 8.5*  8.5* 8.0* 8.3* 8.3* 8.4*  MG 2.4  --  2.5*  --  2.6*  --  2.3  --  2.5*  PHOS 3.9   < > 5.8*   < > 7.0*  6.9* 4.2 5.1* 6.1* 6.6*   < > =  values in this interval not displayed.   GFR: Estimated Creatinine Clearance: 25.6  mL/min (A) (by C-G formula based on SCr of 2.29 mg/dL (H)). Liver Function Tests: Recent Labs  Lab 01/20/20 0356 01/20/20 1846 01/21/20 0421 01/21/20 1918 01/22/20 0339  AST 29  --  27  --   --   ALT 23  --  21  --   --   ALKPHOS 198*  --  189*  --   --   BILITOT 0.5  --  0.2*  --   --   PROT 5.9*  --  5.7*  --   --   ALBUMIN 2.4*  2.4* 2.3* 2.2*  2.3* 2.3* 2.3*   No results for input(s): LIPASE, AMYLASE in the last 168 hours. No results for input(s): AMMONIA in the last 168 hours. Coagulation Profile: No results for input(s): INR, PROTIME in the last 168 hours. Cardiac Enzymes: No results for input(s): CKTOTAL, CKMB, CKMBINDEX, TROPONINI in the last 168 hours. BNP (last 3 results) No results for input(s): PROBNP in the last 8760 hours. HbA1C: No results for input(s): HGBA1C in the last 72 hours. CBG: Recent Labs  Lab 01/21/20 1705 01/21/20 2057 01/22/20 0018 01/22/20 0415 01/22/20 1128  GLUCAP 99 134* 127* 95 88   Lipid Profile: No results for input(s): CHOL, HDL, LDLCALC, TRIG, CHOLHDL, LDLDIRECT in the last 72 hours. Thyroid Function Tests: No results for input(s): TSH, T4TOTAL, FREET4, T3FREE, THYROIDAB in the last 72 hours. Anemia Panel: No results for input(s): VITAMINB12, FOLATE, FERRITIN, TIBC, IRON, RETICCTPCT in the last 72 hours. Sepsis Labs: No results for input(s): PROCALCITON, LATICACIDVEN in the last 168 hours.  No results found for this or any previous visit (from the past 240 hour(s)).       Radiology Studies: DG CHEST PORT 1 VIEW  Result Date: 01/21/2020 CLINICAL DATA:  Shortness of breath EXAM: PORTABLE CHEST 1 VIEW COMPARISON:  01/19/2020 FINDINGS: No significant change in AP portable examination with mild, diffuse interstitial pulmonary opacity and small effusions. No new or focal airspace opacity. Tracheostomy. Large-bore right neck multi lumen vascular catheter. Cardiomegaly. IMPRESSION: No significant change in AP portable examination  with diffuse interstitial pulmonary opacity and small effusions, consistent with edema. No new or focal airspace opacity. Electronically Signed   By: Eddie Candle M.D.   On: 01/21/2020 08:58        Scheduled Meds: . B-complex with vitamin C  1 tablet Per Tube Daily  . chlorhexidine gluconate (MEDLINE KIT)  15 mL Mouth Rinse BID  . Chlorhexidine Gluconate Cloth  6 each Topical Daily  . Chlorhexidine Gluconate Cloth  6 each Topical Q0600  . darbepoetin (ARANESP) injection - NON-DIALYSIS  100 mcg Subcutaneous Q Mon-1800  . feeding supplement (NEPRO CARB STEADY)  1,000 mL Per Tube Q24H  . feeding supplement (PRO-STAT SUGAR FREE 64)  30 mL Per Tube TID  . guaiFENesin  10 mL Per Tube BID  . heparin  5,000 Units Subcutaneous Q8H  . insulin aspart  0-15 Units Subcutaneous Q4H  . mouth rinse  15 mL Mouth Rinse 10 times per day  . pantoprazole sodium  40 mg Per Tube Q24H  . PARoxetine  30 mg Per Tube Daily  . sodium chloride flush  10-40 mL Intracatheter Q12H  . traZODone  25 mg Per Tube QHS   Continuous Infusions: . sodium chloride       LOS: 17 days    Time spent: 35 minutes.    Irine Seal, MD [facility]   To contact the attending provider between 7A-7P or the covering provider during after hours 7P-7A, please log into the web site [email or web address] and access using universal [place] password for that web site. If you do not have the password, please call the hospital operator.  01/22/2020, 1:15 PM

## 2020-01-22 NOTE — Procedures (Signed)
Patient seen and examined on Hemodialysis. BP (!) 107/58   Pulse 69   Temp 97.7 F (36.5 C) (Axillary)   Resp 16   Ht 5\' 4"  (1.626 m)   Wt 97.6 kg   SpO2 95%   BMI 36.93 kg/m   QB 400 mL/ min UF goal 3L  Tolerating treatment without complaints at this time.   [name] MD 9:01 AM

## 2020-01-22 NOTE — Progress Notes (Signed)
Pt trach capped per order.  Pt tolerated procedure well, with no signs of distress.  Pt currently on 5L N/C, with sats near 100%.  Will instruct RN to titrate as tolerated,

## 2020-01-22 NOTE — Progress Notes (Signed)
[facility] Progress Note    74 y.o.femalewho had a prolonged 81 day hospitalization at [facility] with  HTN COPD DM hypothyroidism Ogilvie's syndrome. PEA arrest during EGD in early December.Intermittent hyperkalemia treatet medically and with HD at OSH and now recently w/ worsening renal function over the past few days prior to admission to [facility]. She actually had a foley placed 12/28/19 when the renal function was worsening with return of purulence - started on Meropenem. Per review of chart the pt did not want RRT but after urging by family agreed to it.   Assessment/ Plan:   1. AKI on CKD with most likely cause of the AKI being ATN from sepsis.  - She is not a great candidate for long term dialysis due to trach and unclear how much overall recovery potential after 4 month hosp - Granddaughter is Bailey Cox [phone] and daughter is Bailey Cox [phone]; both have POA.  Dr. Augustin Cox spoke with family and they wanted RRT; actually was on dialysis off and on while at [facility] and due to go to [facility].   Attempted iHD 3/5  -> cath converted by VIR to TC- then started CRRT on 3/7-3/11 for clearance and also for the anasarca/ volume management-   stopped CRRT on 3/11.  Suspect will need ongoing RRT in the form of IHD.  Planning for IHD to keep on TTS  schedule while here via [place].  Next 3/18. 2. Sepsis -  No longer on pressors -  broad spectrum abx(cefepime and vanc) 3. ?urosepsis -  urine is clearer clinically 4. H/o PEA arrest 5. MRSA PNA on vanc/cefepime  6. Respiratory failure with  trach 7. Htn.vol-  Massive volume overload-  15 liters negative with CRRT-  Will cont UF as able on IHD 8. Anemia-   iron stores OK- giving ESA - transfuse PRN 9. elytes-  Phos and K  OK  10. Dispo: will have lots more options if she gets decannulated  Subjective:    Successful downsize of trach yesterday.  On HD today- no complaints.    Objective:   BP (!) 107/58   Pulse  69   Temp 97.7 F (36.5 C) (Axillary)   Resp 16   Ht '5\' 4"'$  (1.626 m)   Wt 97.6 kg   SpO2 95%   BMI 36.93 kg/m   Intake/Output Summary (Last 24 hours) at 01/22/2020 0900 Last data filed at 01/21/2020 1700 Gross per 24 hour  Intake 491.33 ml  Output --  Net 491.33 ml   Weight change: 4.4 kg  Physical Exam: GEN: NAD, awake and alert HEENT: EOMI NECK: Supple, no thyromegaly + trach LUNGS: CTA anteriorly CV: RRR, No M/R/G ABD: NA EXT: dependent and flank edema ACCESS: RIJ TC  Imaging: DG CHEST PORT 1 VIEW  Result Date: 01/21/2020 CLINICAL DATA:  Shortness of breath EXAM: PORTABLE CHEST 1 VIEW COMPARISON:  01/19/2020 FINDINGS: No significant change in AP portable examination with mild, diffuse interstitial pulmonary opacity and small effusions. No new or focal airspace opacity. Tracheostomy. Large-bore right neck multi lumen vascular catheter. Cardiomegaly. IMPRESSION: No significant change in AP portable examination with diffuse interstitial pulmonary opacity and small effusions, consistent with edema. No new or focal airspace opacity. Electronically Signed   By: Bailey Cox M.D.   On: 01/21/2020 08:58    Labs: BMET Recent Labs  Lab 01/19/20 0315 01/19/20 1739 01/20/20 0356 01/20/20 1846 01/21/20 0421 01/21/20 1918 01/22/20 0339  NA 137 134* 136  135 134* 135  134* 135  K 4.6 5.1 5.0  5.0 4.1 4.2 4.4 4.3  CL 97* 94* 97*  95* 97* 96* 95* 96*  CO2 '30 28 29  29 30 29 28 30  '$ GLUCOSE 166* 168* 121*  121* 178* 102* 131* 105*  BUN 63* 76* 86*  87* 43* 57* 77* 88*  CREATININE 1.96* 2.29* 2.53*  2.42* 1.45* 1.70* 2.09* 2.29*  CALCIUM 8.5* 8.4* 8.5*  8.5* 8.0* 8.3* 8.3* 8.4*  PHOS 5.8* 6.6* 7.0*  6.9* 4.2 5.1* 6.1* 6.6*   CBC Recent Labs  Lab 01/17/20 0424 01/18/20 0542 01/19/20 0315 01/20/20 0356  WBC 8.4 5.5 4.3 4.0  NEUTROABS  --   --   --  2.8  HGB 7.3* 7.0* 7.1* 7.6*  HCT 24.4* 24.0* 24.1* 25.2*  MCV 98.4 98.0 98.8 97.7  PLT 77* 75* 77* 73*     Medications:    . B-complex with vitamin C  1 tablet Per Tube Daily  . chlorhexidine gluconate (MEDLINE KIT)  15 mL Mouth Rinse BID  . Chlorhexidine Gluconate Cloth  6 each Topical Daily  . Chlorhexidine Gluconate Cloth  6 each Topical Q0600  . darbepoetin (ARANESP) injection - NON-DIALYSIS  100 mcg Subcutaneous Q Mon-1800  . feeding supplement (NEPRO CARB STEADY)  1,000 mL Per Tube Q24H  . feeding supplement (PRO-STAT SUGAR FREE 64)  30 mL Per Tube TID  . guaiFENesin  10 mL Per Tube BID  . heparin  5,000 Units Subcutaneous Q8H  . insulin aspart  0-15 Units Subcutaneous Q4H  . mouth rinse  15 mL Mouth Rinse 10 times per day  . pantoprazole sodium  40 mg Per Tube Q24H  . PARoxetine  30 mg Per Tube Daily  . sodium chloride flush  10-40 mL Intracatheter Q12H  . traZODone  25 mg Per Tube QHS      Bailey Cox  01/22/2020, 9:00 AM

## 2020-01-22 NOTE — Progress Notes (Signed)
NAME:  Bailey Cox, MRN:  [id number], DOB:  16-Apr-1946, LOS: 45 ADMISSION DATE:  01/05/2020, CONSULTATION DATE:  01/06/20 REFERRING MD:  [place] EDP, CHIEF COMPLAINT:   Shock  Brief History   74 year old female admitted 3/1 from [facility] where she had just arrived after long admission to [facility] where she suffered PEA arrest during endoscopy and ended up requiring tracheostomy. Presents to [facility] ED with widespread peripheral edema and acidosis.   History of present illness   74 year old female with PMH as below, which is significant for long admission in the [facility] system from 10/2019 all the way through 01/05/2020.  She was originally hospitalized in September 2020 with acute cholecystitis and underwent a cholecystectomy.  She was then sent to SNF for short duration but had return to [facility] to have a bile duct stent placed ultimately returning to SNF.  She then returned to [facility] with bowel dilation secondary to Ogilvie's syndrome.  While hospitalized she developed fluid overload and was ultimately transferred to [facility] for dialysis.  Her course after that point become somewhat unclear, but as described by her family she was on and off the ventilator for several procedures and was eventually left intubated.  One of these procedures was an endoscopy during which she unfortunately suffered a cardiac arrest.  Details of the arrest are unclear.  She remained on the ventilator and ultimately underwent tracheostomy.  Her hospital course was also complicated by MRSA pneumonia.  She was ultimately able to come off of hemodialysis and was treated intermittently with diuresis.  It sounds like she bounced in and out of the ICU a few times with volume overload.  On the tail end of her admission she still struggled with kidney disease and was being treated for a urinary tract infection with cefepime and vancomycin.  Plans were to stop on 3/2.  She was discharged from the [facility] system on 3/1 to  [facility] in [place].  Upon arrival to [facility] they deemed her too sick for admission and transferred her to [facility] emergency department with complaints of volume overload and acidosis.   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  Significant Hospital Events   07/2019 admit to [facility] for cholecystitis  09/2019 tx to [facility] for renal failure/volume overload. HD started.  10/2019 PEA arrest during EGD . Intubated 11/2019 Trach  3/8 Tolerating TCT 3/9-3/12 CRRT 3/14 Tolerating HD. Will transfer to floor  Consults:  Nephrology  [place] IR  Procedures:  PICC pre admit > Trach pre admit > 3/5 IJ vas cath->  Significant Diagnostic Tests:  Echo 10/24/2019 > LVEF 55%, Grade 1 DD. RV systolic function normal.  Echo 3/2: LVEF 65-70%, grade II dysfunction. RVSP  Micro Data:  Blood 3/1 >> ng Urine 3/1 >> ng resp 3/4 >> pan-sensitive pseudomonas   Antimicrobials:  Meropenem 2/21 >> off  Cefepime Pre hosp (for tracheitis )  >> 3/1-> 3/7 Vancomycin pre hosp >> 3/2   Interim history/subjective:  Tolerated HD with volume removal yesterday. No complaints this morning. Remains on trach collar.  Objective   Blood pressure (!) 115/57, pulse 65, temperature 97.7 F (36.5 C), temperature source Axillary, resp. rate 18, height 5\' 4"  (1.626 m), weight 97.6 kg, SpO2 95 %.    FiO2 (%):  [  28 %-40 %] 28 %   Intake/Output Summary (Last 24 hours) at 01/22/2020 1114 Last data filed at 01/21/2020 1700 Gross per 24 hour  Intake 491.33 ml  Output --  Net 491.33 ml   Filed Weights   01/20/20 0733 01/20/20 1113 01/22/20 0655  Weight: 93.2 kg 89 kg 97.6 kg   Physical Exam: GEN: no acute distress HEENT: trach in place minimal secretions CV: RRR,  ext warm PULM: Clear, no wheeezing GI: Soft, +BS, PEG in place EXT: No edema NEURO: moves all 4 ext PSYCH: confused SKIN: No rashes    CBC Latest Ref Rng & Units 01/20/2020 01/19/2020 01/18/2020  WBC 4.0 - 10.5 K/uL 4.0 4.3 5.5  Hemoglobin 12.0 - 15.0 g/dL 7.6(L) 7.1(L) 7.0(L)  Hematocrit 36.0 - 46.0 % 25.2(L) 24.1(L) 24.0(L)  Platelets 150 - 400 K/uL 73(L) 77(L) 75(L)   BMP Latest Ref Rng & Units 01/22/2020 01/21/2020 01/21/2020  Glucose 70 - 99 mg/dL 105(H) 131(H) 102(H)  BUN 8 - 23 mg/dL 88(H) 77(H) 57(H)  Creatinine 0.44 - 1.00 mg/dL 2.29(H) 2.09(H) 1.70(H)  Sodium 135 - 145 mmol/L 135 134(L) 135  Potassium 3.5 - 5.1 mmol/L 4.3 4.4 4.2  Chloride 98 - 111 mmol/L 96(L) 95(L) 96(L)  CO2 22 - 32 mmol/L 30 28 29   Calcium 8.9 - 10.3 mg/dL 8.4(L) 8.3(L) 8.3(L)    Resolved problems:  Metabolic acidosis, Septic shock, Elevated lipase Acute on chronic hypoxic respiratory failure secondary to Pseudomonal HCAP s/p antibiotics x 7d Assessment & Plan:  74 year old female with chronic tracheostomy who was transferred for HCAP and AKI requiring CRRT. Now completed antibiotics and has been started on HD.  Chronic tracheostomy status, placed in 11/2018. Discussed with family who was under the impression she would be decannulated when ready however due to transfer of care from [facility], she seems to have been lost to follow-up. Off vent since 3/08  Cap trach, place on [place] Potential decannulation in AM  Erskine Emery MD PCCM

## 2020-01-22 NOTE — NC FL2 (Addendum)
[place] LEVEL OF CARE SCREENING TOOL     IDENTIFICATION  Patient Name: Bailey Cox Birthdate: 31-Oct-1946 Sex: female Admission Date (Current Location): 01/05/2020  [facility] and [place] Number:  Herbalist and Address:  The . [facility], [place] [address] [place], [place] [address]      Provider Number: [id number]  Attending Physician Name and Address:  Eugenie Filler, MD  Relative Name and Phone Number:       Current Level of Care: Hospital Recommended Level of Care: [place] Prior Approval Number:    Date Approved/Denied:   PASRR Number: [id number]  Discharge Plan: SNF    Current Diagnoses: Patient Active Problem List   Diagnosis Date Noted  . Acute hypoxemic respiratory failure ([place])   . Acute kidney injury ([place])   . Anemia due to stage 4 chronic kidney disease ([place])   . Hypotension   . Sepsis ([place])   . Renal failure 01/06/2020  . Tracheostomy status ([place]) 01/06/2020  . Chronic hypoxemic respiratory failure ([place]) 01/06/2020  . Shock ([place]) 01/05/2020    Orientation RESPIRATION BLADDER Height & Weight     Self(alert)  O2 (nasal canula) Incontinent Weight: 215 lb 2.7 oz (97.6 kg) Height:  '5\' 4"'$  (162.6 cm)  BEHAVIORAL SYMPTOMS/MOOD NEUROLOGICAL BOWEL NUTRITION STATUS      Incontinent Diet, Feeding tube(Osmolite 1.5 (960 ml/day) via PEG; 60 ml Prostat BID)  AMBULATORY STATUS COMMUNICATION OF NEEDS Skin   Extensive Assist Verbally Surgical wounds, Skin abrasions, Other (Comment)(new peg tube LUQ; MASD on buttocks and perineum w/ foam; wound on groin w/ silver hydrofiber; wound on arm)                       Personal Care Assistance Level of Assistance  Bathing, Feeding, Dressing Bathing Assistance: Maximum assistance Feeding assistance: Maximum assistance Dressing Assistance: Maximum assistance     Functional Limitations Info  Sight, Hearing, Speech Sight Info: Adequate Hearing Info:  Adequate Speech Info: Adequate    SPECIAL CARE FACTORS FREQUENCY  PT (By licensed PT), OT (By licensed OT)  PT Frequency: 5x week OT Frequency: 5x week     Contractures Contractures Info: Not present    Additional Factors Info  Code Status, Allergies, Psychotropic, Insulin Sliding Scale Code Status Info: Full Code Allergies Info: Drug Class (Clindamycin/lincomycin), Drug Ingredient (Cephalexin), Nsaids, Penicillins, Sulfa Antibiotics, Dilaudid (Hydromorphone Hcl) Psychotropic Info: PARoxetine (PAXIL) tablet 30 mg daily per tube; traZODone (DESYREL) tablet 25 mg daily at bedtime per tube Insulin Sliding Scale Info: insulin aspart (novoLOG) injection 0-15 Units every 4 hrs       Current Medications (01/22/2020):  This is the current hospital active medication list Current Facility-Administered Medications  Medication Dose Route Frequency Provider Last Rate Last Admin  . ALPRAZolam (XANAX) tablet 0.25 mg  0.25 mg Per Tube TID PRN Mannam, Praveen, MD   0.25 mg at 01/21/20 1633  . B-complex with vitamin C tablet 1 tablet  1 tablet Per Tube Daily Raiford Noble [place], DO   1 tablet at 01/22/20 1143  . chlorhexidine gluconate (MEDLINE KIT) (PERIDEX) 0.12 % solution 15 mL  15 mL Mouth Rinse BID Corey Harold, NP   15 mL at 01/21/20 2059  . Chlorhexidine Gluconate Cloth 2 % PADS 6 each  6 each Topical Daily Corey Harold, NP   6 each at 01/21/20 [phone]  . Chlorhexidine Gluconate Cloth 2 % PADS 6 each  6 each Topical Q0600 Moshe Cipro,  [name], MD   6 each at 01/22/20 [phone]  . Darbepoetin Alfa (ARANESP) injection 100 mcg  100 mcg Subcutaneous Q Mon-1800 Corliss Parish, MD   100 mcg at 01/19/20 1811  . feeding supplement (NEPRO CARB STEADY) liquid 1,000 mL  1,000 mL Per Tube Q24H Raiford Noble Alanreed, DO 40 mL/hr at 01/22/20 0733 1,000 mL at 01/22/20 0733  . feeding supplement (PRO-STAT SUGAR FREE 64) liquid 30 mL  30 mL Per Tube TID Raiford Noble Latif, DO   30 mL at 01/22/20 1143  .  fentaNYL (SUBLIMAZE) injection 25-100 mcg  25-100 mcg Intravenous Q2H PRN Rigoberto Noel, MD   100 mcg at 01/20/20 1616  . guaiFENesin (ROBITUSSIN) 100 MG/5ML solution 200 mg  10 mL Per Tube BID Eugenie Filler, MD   200 mg at 01/22/20 1143  . heparin injection 5,000 Units  5,000 Units Subcutaneous Q8H Margaretha Seeds, MD   5,000 Units at 01/22/20 0517  . insulin aspart (novoLOG) injection 0-15 Units  0-15 Units Subcutaneous Q4H Corey Harold, NP   2 Units at 01/22/20 0052  . MEDLINE mouth rinse  15 mL Mouth Rinse 10 times per day Corey Harold, NP   15 mL at 01/22/20 1141  . metoprolol tartrate (LOPRESSOR) injection 2.5-5 mg  2.5-5 mg Intravenous Q3H PRN Rigoberto Noel, MD   5 mg at 01/17/20 1603  . pantoprazole sodium (PROTONIX) 40 mg/20 mL oral suspension 40 mg  40 mg Per Tube Q24H Chesley Mires, MD   40 mg at 01/22/20 1143  . PARoxetine (PAXIL) tablet 30 mg  30 mg Per Tube Daily Mannam, Praveen, MD   30 mg at 01/22/20 1142  . sodium chloride 0.9 % primer fluid for CRRT   CRRT PRN Audria Nine, DO      . sodium chloride flush (NS) 0.9 % injection 10-40 mL  10-40 mL Intracatheter Q12H Kandis Cocking, MD   10 mL at 01/21/20 2300  . sodium chloride flush (NS) 0.9 % injection 10-40 mL  10-40 mL Intracatheter PRN Kandis Cocking, MD      . traZODone (DESYREL) tablet 25 mg  25 mg Per Tube QHS Margaretha Seeds, MD   25 mg at 01/21/20 2100     Discharge Medications: Please see discharge summary for a list of discharge medications.  Relevant Imaging Results:  Relevant Lab Results:   Additional Information SS# [place], [place]

## 2020-01-22 NOTE — Progress Notes (Signed)
PT Cancellation Note  Patient Details Name: Bailey Cox MRN: [id number] DOB: 1946/10/09   Cancelled Treatment:    Reason Eval/Treat Not Completed: Patient declined, no reason specified Pt continues to refuse PT. When questioned with yes/no answers patient reports she did not want to participate because her back hurts. Pt did allow therapist to adjust HOB up to 45 degrees to work towards goal of sitting EOB. Pt nodded understanding on need to participate with therapy tomorrow so that we keep working with her vs discharging until she feels more able to work with therapy.  Ann Held PT, DPT Acute Rehab [facility] P: [phone]   Renato Gails 01/22/2020, 1:55 PM

## 2020-01-23 DIAGNOSIS — J9611 Chronic respiratory failure with hypoxia: Secondary | ICD-10-CM | POA: Diagnosis not present

## 2020-01-23 DIAGNOSIS — J9601 Acute respiratory failure with hypoxia: Secondary | ICD-10-CM | POA: Diagnosis not present

## 2020-01-23 DIAGNOSIS — Z930 Tracheostomy status: Secondary | ICD-10-CM | POA: Diagnosis not present

## 2020-01-23 DIAGNOSIS — N179 Acute kidney failure, unspecified: Secondary | ICD-10-CM | POA: Diagnosis not present

## 2020-01-23 LAB — GLUCOSE, CAPILLARY
Glucose-Capillary: 107 mg/dL — ABNORMAL HIGH (ref 70–99)
Glucose-Capillary: 112 mg/dL — ABNORMAL HIGH (ref 70–99)
Glucose-Capillary: 123 mg/dL — ABNORMAL HIGH (ref 70–99)
Glucose-Capillary: 127 mg/dL — ABNORMAL HIGH (ref 70–99)
Glucose-Capillary: 129 mg/dL — ABNORMAL HIGH (ref 70–99)
Glucose-Capillary: 133 mg/dL — ABNORMAL HIGH (ref 70–99)

## 2020-01-23 LAB — RENAL FUNCTION PANEL
Albumin: 2.3 g/dL — ABNORMAL LOW (ref 3.5–5.0)
Albumin: 2.5 g/dL — ABNORMAL LOW (ref 3.5–5.0)
Anion gap: 10 (ref 5–15)
Anion gap: 9 (ref 5–15)
BUN: 47 mg/dL — ABNORMAL HIGH (ref 8–23)
BUN: 58 mg/dL — ABNORMAL HIGH (ref 8–23)
CO2: 30 mmol/L (ref 22–32)
CO2: 29 mmol/L (ref 22–32)
Calcium: 8.4 mg/dL — ABNORMAL LOW (ref 8.9–10.3)
Calcium: 8.5 mg/dL — ABNORMAL LOW (ref 8.9–10.3)
Chloride: 94 mmol/L — ABNORMAL LOW (ref 98–111)
Chloride: 95 mmol/L — ABNORMAL LOW (ref 98–111)
Creatinine, Ser: 1.63 mg/dL — ABNORMAL HIGH (ref 0.44–1.00)
Creatinine, Ser: 1.99 mg/dL — ABNORMAL HIGH (ref 0.44–1.00)
GFR calc Af Amer: 36 mL/min — ABNORMAL LOW (ref >60)
GFR calc Af Amer: 29 mL/min — ABNORMAL LOW (ref >60)
GFR calc non Af Amer: 31 mL/min — ABNORMAL LOW (ref >60)
GFR calc non Af Amer: 25 mL/min — ABNORMAL LOW (ref >60)
Glucose, Bld: 113 mg/dL — ABNORMAL HIGH (ref 70–99)
Glucose, Bld: 154 mg/dL — ABNORMAL HIGH (ref 70–99)
Phosphorus: 4.8 mg/dL — ABNORMAL HIGH (ref 2.5–4.6)
Phosphorus: 5.7 mg/dL — ABNORMAL HIGH (ref 2.5–4.6)
Potassium: 4 mmol/L (ref 3.5–5.1)
Potassium: 4.3 mmol/L (ref 3.5–5.1)
Sodium: 134 mmol/L — ABNORMAL LOW (ref 135–145)
Sodium: 133 mmol/L — ABNORMAL LOW (ref 135–145)

## 2020-01-23 LAB — CBC
HCT: 25.1 % — ABNORMAL LOW (ref 36.0–46.0)
Hemoglobin: 7.6 g/dL — ABNORMAL LOW (ref 12.0–15.0)
MCH: 29.9 pg (ref 26.0–34.0)
MCHC: 30.3 g/dL (ref 30.0–36.0)
MCV: 98.8 fL (ref 80.0–100.0)
Platelets: 71 10*3/uL — ABNORMAL LOW (ref 150–400)
RBC: 2.54 MIL/uL — ABNORMAL LOW (ref 3.87–5.11)
RDW: 18.6 % — ABNORMAL HIGH (ref 11.5–15.5)
WBC: 3.6 10*3/uL — ABNORMAL LOW (ref 4.0–10.5)
nRBC: 0.6 % — ABNORMAL HIGH (ref 0.0–0.2)

## 2020-01-23 LAB — MAGNESIUM: Magnesium: 2.4 mg/dL (ref 1.7–2.4)

## 2020-01-23 NOTE — Progress Notes (Signed)
PT Cancellation Note  Patient Details Name: Bailey Cox MRN: [id number] DOB: March 02, 1946   Cancelled Treatment:    Reason Eval/Treat Not Completed: Patient declined, no reason specified. Pt declined therapy today stating "I just don't feel like it." The pt was educated in the role of acute PT and the effects of continued bed rest but pt continued to decline PT today. PT will continue to follow for now, but may sign-off soon due to pt lack of participation and continued refusals.   Karma Ganja, PT, DPT   Acute Rehabilitation Department Pager #: [phone]   Otho Bellows 01/23/2020, 2:53 PM

## 2020-01-23 NOTE — Evaluation (Signed)
Clinical/Bedside Swallow Evaluation Patient Details  Name: Bailey Cox MRN: [id number] Date of Birth: [birth year]  Today's Date: 01/23/2020 Time: SLP Start Time (ACUTE ONLY): 7412 SLP Stop Time (ACUTE ONLY): 1558 SLP Time Calculation (min) (ACUTE ONLY): 20 min  Past Medical History:  Past Medical History:  Diagnosis Date  . Chronic respiratory failure ([place])   . CKD (chronic kidney disease)   . DM (diabetes mellitus) ([place])   . GERD (gastroesophageal reflux disease)   . HTN (hypertension)   . Ogilvie syndrome   . Tracheostomy status ([place])    2020  . Vascular dementia [facility])    Past Surgical History:  Past Surgical History:  Procedure Laterality Date  . IR FLUORO GUIDE CV LINE RIGHT  01/10/2020  . IR US GUIDE VASC ACCESS RIGHT  01/10/2020   HPI:  74 y.o. female who had a prolonged 25 day hospitalization at [facility] with a history of HTN COPD DM hypothyroidism Ogilvie's syndrome. PEA arrest during EGD in early December with duodenal ulcer noted on EGD. Intermittent hyperkalemia treatet medically w/ Kayexalate and now recently w/ worsening renal function over the past few days prior to admission to [facility].  during admission at [facility] [facility] pt weaned off the vent and eventually decannulated. According to her daughter, she has not had anything to eat or drink since November due to primary problem Olglivie's Syndrome, but had no difficulty with swallowing prior.   Assessment / Plan / Recommendation Clinical Impression  Pt very reluctant to participate in swallow evaluation. She refused all trials, but with max encouragement she accepted a few sips of water and a three bites of jello. No signs of aspiration were observed, but futher observation is needed. May be able to attempt sips and taste of jello at bedside and monitor for tolerance in order to avoid instrumental testing, which the pt would likely not participate in. Will f/u for toelrance but ask RN to offer minimal PO as described.  SLP  Visit Diagnosis: Dysphagia, unspecified (R13.10)    Aspiration Risk  Moderate aspiration risk    Diet Recommendation Other (Comment)(sips of water, bites of jello)   Liquid Administration via: Cup;Straw Medication Administration: Via alternative means Supervision: Full supervision/cueing for compensatory strategies Compensations: Slow rate;Small sips/bites Postural Changes: (sit as up and pt will tolerate)    Other  Recommendations     Follow up Recommendations Skilled Nursing facility      Frequency and Duration min 2x/week  2 weeks       Prognosis Prognosis for Safe Diet Advancement: Fair Barriers to Reach Goals: Behavior;Motivation      Swallow Study   General HPI: 74 y.o. female who had a prolonged 39 day hospitalization at [facility] with a history of HTN COPD DM hypothyroidism Ogilvie's syndrome. PEA arrest during EGD in early December with duodenal ulcer noted on EGD. Intermittent hyperkalemia treatet medically w/ Kayexalate and now recently w/ worsening renal function over the past few days prior to admission to [facility].  during admission at [facility] pt weaned off the vent and eventually decannulated. According to her daughter, she has not had anything to eat or drink since November due to primary problem Olglivie's Syndrome, but had no difficulty with swallowing prior. Type of Study: Bedside Swallow Evaluation Previous Swallow Assessment: none in chart. Duaghter reports pt could have ice and life savers at [employer] while on Vent Diet Prior to this Study: NPO;PEG tube Temperature Spikes Noted: No Respiratory Status: Room air History of Recent Intubation: No Behavior/Cognition:  Alert Oral Cavity Assessment: Within Functional Limits Oral Care Completed by SLP: No Oral Cavity - Dentition: Edentulous Vision: Functional for self-feeding Self-Feeding Abilities: Total assist Patient Positioning: Partially reclined Baseline Vocal Quality: Normal Volitional Cough: Cognitively  unable to elicit Volitional Swallow: Able to elicit    Oral/Motor/Sensory Function Overall Oral Motor/Sensory Function: Within functional limits   Ice Chips Ice chips: Within functional limits Presentation: Spoon   Thin Liquid Thin Liquid: Within functional limits Presentation: Straw    Nectar Thick Nectar Thick Liquid: Not tested   Honey Thick Honey Thick Liquid: Not tested   Puree Puree: Within functional limits Presentation: Spoon   Solid     Solid: Not tested     [name], MA [place] Pager [phone] Office [phone]  Lynann Beaver 01/23/2020,4:04 PM

## 2020-01-23 NOTE — Progress Notes (Signed)
[facility] Progress Note    74 y.o.femalewho had a prolonged 81 day hospitalization at [facility] with  HTN COPD DM hypothyroidism Ogilvie's syndrome. PEA arrest during EGD in early December.Intermittent hyperkalemia treatet medically and with HD at OSH and now recently w/ worsening renal function over the past few days prior to admission to [facility]. She actually had a foley placed 12/28/19 when the renal function was worsening with return of purulence - started on Meropenem. Per review of chart the pt did not want RRT but after urging by family agreed to it.   Assessment/ Plan:   1. AKI on CKD with most likely cause of the AKI being ATN from sepsis.  - She is not a great candidate for long term dialysis due to trach and unclear how much overall recovery potential after 4 month hosp - [name] [phone] and [name] [phone]; both have POA.  Dr. Augustin Coupe spoke with family and they wanted RRT; actually was on dialysis off and on while at [facility] and due to go to [facility].   Attempted iHD 3/5  -> cath converted by VIR to TC- then started CRRT on 3/7-3/11 for clearance and also for the anasarca/ volume management-   stopped CRRT on 3/11.  Suspect will need ongoing RRT in the form of IHD.  Planning for IHD to keep on TTS  schedule while here via [facility].  Next 3/20. 2. Sepsis -  No longer on pressors -  broad spectrum abx(cefepime and vanc) 3. ?urosepsis -  urine is clearer clinically 4. H/o PEA arrest 5. MRSA PNA on vanc/cefepime  6. Respiratory failure with  Trach-->decannulated 3/19.  7. Htn.vol- vol status getting better 8. Anemia-   iron stores OK- giving ESA - transfuse PRN 9. elytes-  Phos and K  OK  10. Dispo: pending  Subjective:    Decannulated today. HD yesterday, for HD tomorrow.     Objective:   BP (!) 138/44 (BP Location: Left Leg)   Pulse 69   Temp 98.2 F (36.8 C)   Resp 16   Ht '5\' 4"'$  (1.626 m)   Wt 96.9 kg    SpO2 100%   BMI 36.67 kg/m   Intake/Output Summary (Last 24 hours) at 01/23/2020 1350 Last data filed at 01/23/2020 0900 Gross per 24 hour  Intake 720 ml  Output -  Net 720 ml   Weight change: -0.7 kg  Physical Exam: GEN: NAD, awake and alert HEENT: EOMI NECK: Supple, no thyromegaly + trach site dressed LUNGS: CTA anteriorly CV: RRR, No M/R/G ABD: NA EXT: dependent and flank edema ACCESS: RIJ TC  Imaging: No results found.  Labs: BMET Recent Labs  Lab 01/20/20 0356 01/20/20 1846 01/21/20 0421 01/21/20 1918 01/22/20 0339 01/22/20 1543 01/23/20 0247  NA 136  135 134* 135 134* 135 134* 134*  K 5.0  5.0 4.1 4.2 4.4 4.3 4.2 4.0  CL 97*  95* 97* 96* 95* 96* 96* 94*  CO2 '29  29 30 29 28 30 29 30  '$ GLUCOSE 121*  121* 178* 102* 131* 105* 129* 113*  BUN 86*  87* 43* 57* 77* 88* 32* 47*  CREATININE 2.53*  2.42* 1.45* 1.70* 2.09* 2.29* 1.27* 1.63*  CALCIUM 8.5*  8.5* 8.0* 8.3* 8.3* 8.4* 8.3* 8.4*  PHOS 7.0*  6.9* 4.2 5.1* 6.1* 6.6* 3.6 4.8*   CBC Recent Labs  Lab 01/19/20 0315 01/20/20 0356 01/22/20 1543 01/23/20 0247  WBC 4.3 4.0 3.6* 3.6*  NEUTROABS  --  2.8 2.6  --   HGB 7.1* 7.6* 7.7* 7.6*  HCT 24.1* 25.2* 25.8* 25.1*  MCV 98.8 97.7 96.6 98.8  PLT 77* 73* 68* 71*    Medications:    . B-complex with vitamin C  1 tablet Per Tube Daily  . chlorhexidine gluconate (MEDLINE KIT)  15 mL Mouth Rinse BID  . Chlorhexidine Gluconate Cloth  6 each Topical Daily  . Chlorhexidine Gluconate Cloth  6 each Topical Q0600  . darbepoetin (ARANESP) injection - NON-DIALYSIS  100 mcg Subcutaneous Q Mon-1800  . feeding supplement (NEPRO CARB STEADY)  1,000 mL Per Tube Q24H  . feeding supplement (PRO-STAT SUGAR FREE 64)  30 mL Per Tube TID  . guaiFENesin  10 mL Per Tube BID  . heparin  5,000 Units Subcutaneous Q8H  . insulin aspart  0-15 Units Subcutaneous Q4H  . mouth rinse  15 mL Mouth Rinse 10 times per day  . pantoprazole sodium  40 mg Per Tube Q24H  .  PARoxetine  30 mg Per Tube Daily  . sodium chloride flush  10-40 mL Intracatheter Q12H  . traZODone  25 mg Per Tube QHS      Bailey Cox  01/23/2020, 1:50 PM

## 2020-01-23 NOTE — Progress Notes (Signed)
Nurse tech notified this RN about patient's refusal of vital signs to being taken. This RN convinced and explained to patient the importance of taking her vital signs for monitoring but patient continuously refused and stated "not right now". Will try again later.

## 2020-01-23 NOTE — Progress Notes (Signed)
RT note: RT removed patient trach per MD order. RT cover trach stoma per policy and notified bedside RN. Vital signs stable through out. RT will continue to monitor first 24 hours post trach removal.

## 2020-01-23 NOTE — Progress Notes (Signed)
PROGRESS NOTE    Bailey Cox  ZOX:[id number] DOB: 24-Jan-1946 DOA: 01/05/2020 PCP: Patient, No Pcp Per   Brief Narrative:  The patient is a 74 year old obese Caucasian female who was admitted on 01/05/2020 from [facility] where she had just arrived after a long admission to [facility] where she was hospitalized from 10/2019 up until 01/05/2020 where she suffered a PEA arrest during endoscopy and ended up requiring a tracheostomy.  She presented to [facility] with widespread peripheral edema and acidosis.    Originally she was hospitalized in September 2020 with acute cholecystitis and cholecystectomy.  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  While hospitalized she developed fluid overload and ultimately transferred to [facility] for dialysis.  Her clinical course after that point became somewhat unclear but family described that she was on a ventilator for several procedures and was eventually left intubated.  1 of those procedures and endoscopy which she unfortunately suffered a cardiac arrest.  Again details of the cardiac arrest or not clear but she remained on the ventilator and ultimately underwent a tracheostomy.  At that time her hospital course was also complicated by MRSA pneumonia.  She is ultimately able to come off of hemodialysis and treated with intermittent diuresis.  During her hospital stay she bounced in and out of the ICU for a few times with volume overload and at the end of her admission at [facility] she still struggled with kidney disease and was being treated for urinary tract infection with vancomycin and cefepime.  Plans were stopped this on 3 2.  She was discharged on the [facility] system on [facility] in [place] upon arrival to [facility] they deemed her too sick for admission transferred her directly to [facility] emergency department with complaints of volume overload and acidosis.   Since then she has been in ICU and has been off of the ventilator and now on trach collar.  She underwent CRRT from 3 9 until 312 and transferred to the floor on 3/14 after she was tolerating hemodialysis.  Currently nephrology has been consulted and they feel that she has an AKI on CKD most likely cause of her AKI being from ATN from sepsis and family still wants aggressive measures so she was on CRRT from 3 7 until 311 for clearance and also for anasarca and volume management.  Her CRRT was stopped on 311 and Dr. Moshe Cipro suspected that she will likely require renal replacement therapy in the setting of intermittent hemodialysis.  They feel that if she has a trach and is dialysis dependent the only option would be send her to LTAC.  The plan is for intermittent hemodialysis schedule on Tuesday Thursdays and Saturdays here via [facility].  Placement is likely going to be an issue because of this.    PT OT recommending SNF at this time.  Patient with no days left for LTAC.   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 Tracheostomy placed 11/25/2018.  Per PCCM they discussed with family  who was under the impression patient be decannulated when ready however due to transfer of care from [facility] patient was lost to follow-up. patient admitted for anasarca and acute respiratory failure had to be placed on the vent as well as dialyzed with some clinical improvement and placed back on trach collar.  Patient with clear lung sounds.  Was initially rhonchorous however has improved.  Status post IV antibiotics of cefepime and vancomycin.  Patient  off ventilator since 01/12/2020.  Placed on Mucinex twice daily.  Speech therapy consulted for use of Passy-Muir valve.  Chest PT. per pulmonary patient tolerated capping 3/18-3/19.  Pulmonary following and managing and per pulmonary patient for decannulation today.  Per pulmonary.    2.  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  Pulmonary following and feel patient is not a great candidate for long-term dialysis due to trach.  Dr. Augustin Coupe had spoken with family and they wanted RRT.  Patient was on dialysis off and on while at [facility] and due to get dialysis while at [place].  Patient noted to have intermittent HD on 01/09/2020.  Patient started on CRRT on 372 01/15/2020 for clearance and also for anasarca/volume management.  CRRT was stopped on 01/15/2020.  Nephrology following and planning for intermittent HD to keep on Tuesday Thursday Saturday schedule while in-house versus TDC.  Patient underwent hemodialysis on 01/22/2020 per nephrology.  3.  Well-controlled diabetes type 2 CBG of 112 this morning.  Hemoglobin A1c 5.7.  Continue sliding scale insulin.  Follow.  4.  Anemia of critical illness/anemia of chronic disease/thrombocytopenia Patient with no overt bleeding.  Hemoglobin currently at 7.6.  Platelet count at 71.  Anemia panel with iron of 81, ferritin of 886.  Patient receiving Aranesp per nephrology.  Follow H&H.  Transfusion threshold hemoglobin < 7 which would likely need to be done in hemodialysis if needed.  5.  Hyponatremia Improved with hemodialysis.  Follow.  6.  Anxiety Continue current dose of Paxil at half home dose and alprazolam 3 times daily as needed.  Trazodone per tube nightly.  7.  Gastroesophageal reflux disease/ PPI.    8.  Left upper extremity swelling In the setting of anasarca.  Improved.  Upper extremity duplex negative for DVT.  Follow.  9.  Obesity  10.  Nutrition Patient started on Nephro-Vite  40 cc/h via PEG.  Continue 30 cc prostat 3 times daily.  Dietitian following.    DVT prophylaxis: Heparin Code Status: Full Family Communication: Updated patient.  No family at bedside. Disposition Plan:  . Patient came from: [facility]            . Anticipated d/c place: SNF . Barriers to d/c OR conditions which need to be met to effect a safe d/c: Patient was in the ICU for 14 days status post tracheostomy required intermittent dialysis.  Patient has been refused for [facility] as well as select specialty LTAC.  Likely to skilled nursing facility however will need to have a cuffless trach and be able to sit up for dialysis so difficult placement at this time.   Consultants:   Nephrology: Dr. Augustin Coupe 01/06/2020  Wound care  Entered interventional radiology  Procedures:  PICC pre admit > Trach pre admit > 3/5 IJ vas cath-> Left upper extremity Doppler negative for DVT 01/19/2020  Significant Diagnostic Tests:  Echo 10/24/2019>LVEF 55%, Grade 1 DD. RV systolic function normal.  Echo 3/2: LVEF 65-70%, grade II  dysfunction. RVSP   Antimicrobials:  Meropenem 2/21 >>off  Cefepime Pre hosp (for tracheitis ) >>3/1->3/7 Vancomycin pre hosp >>3/2  IV vancomycin 01/10/2020>>>> 01/13/2020    Micro Data:  Blood 3/1 >> ng Urine 3/1 >> ng resp 3/4 >>pan-sensitivepseudomonas    Echo 10/24/2019>LVEF 55%, Grade 1 DD. RV systolic function normal.  Echo 3/2: LVEF 65-70%, grade II dysfunction. RVSP     Subjective: Patient sleeping.   Objective: Vitals:   01/23/20 0400 01/23/20 0442 01/23/20 0826 01/23/20 1213  BP:  (!) 138/44    Pulse:  69 72 69  Resp:  '16 16 16  '$ Temp:  98.2 F (36.8 C)    TempSrc:      SpO2: 100% 100% 100% 100%  Weight:      Height:        Intake/Output Summary (Last 24 hours) at 01/23/2020 1214 Last data filed at 01/23/2020 0900 Gross per 24 hour  Intake 720 ml  Output --  Net 720 ml   Filed Weights   01/20/20 1113 01/22/20 0655 01/22/20  1045  Weight: 89 kg 97.6 kg 96.9 kg    Examination:  General exam: NAD Respiratory system: CTA B anterior lung fields.  No wheezes, no crackles, no rhonchi.  Trach in place.   Cardiovascular system: Regular rate rhythm no murmurs rubs or gallops.  No JVD.  No lower extremity edema.  Gastrointestinal system: Abdomen is soft, nontender, nondistended, positive bowel sounds.  No rebound.  No guarding.  Central nervous system: Alert and oriented. No focal neurological deficits. Extremities: Symmetric 5 x 5 power. Skin: No rashes, lesions or ulcers Psychiatry: Judgement and insight appear normal. Mood & affect appropriate.     Data Reviewed: I have personally reviewed following labs and imaging studies  CBC: Recent Labs  Lab 01/18/20 0542 01/19/20 0315 01/20/20 0356 01/22/20 1543 01/23/20 0247  WBC 5.5 4.3 4.0 3.6* 3.6*  NEUTROABS  --   --  2.8 2.6  --   HGB 7.0* 7.1* 7.6* 7.7* 7.6*  HCT 24.0* 24.1* 25.2* 25.8* 25.1*  MCV 98.0 98.8 97.7 96.6 98.8  PLT 75* 77* 73* 68* 71*   Basic Metabolic Panel: Recent Labs  Lab 01/19/20 0315 01/19/20 1739 01/20/20 0356 01/20/20 1846 01/21/20 0421 01/21/20 1918 01/22/20 0339 01/22/20 1543 01/23/20 0247  NA 137   < > 136  135   < > 135 134* 135 134* 134*  K 4.6   < > 5.0  5.0   < > 4.2 4.4 4.3 4.2 4.0  CL 97*   < > 97*  95*   < > 96* 95* 96* 96* 94*  CO2 30   < > 29  29   < > '29 28 30 29 30  '$ GLUCOSE 166*   < > 121*  121*   < > 102* 131* 105* 129* 113*  BUN 63*   < > 86*  87*   < > 57* 77* 88* 32* 47*  CREATININE 1.96*   < > 2.53*  2.42*   < > 1.70* 2.09* 2.29* 1.27* 1.63*  CALCIUM 8.5*   < > 8.5*  8.5*   < > 8.3* 8.3* 8.4* 8.3* 8.4*  MG 2.5*  --  2.6*  --  2.3  --  2.5*  --  2.4  PHOS 5.8*   < > 7.0*  6.9*   < > 5.1* 6.1* 6.6* 3.6 4.8*   < > = values in this interval not displayed.   GFR: Estimated Creatinine Clearance:  35.8 mL/min (A) (by C-G formula based on SCr of 1.63 mg/dL (H)). Liver Function Tests: Recent Labs    Lab 01/20/20 0356 01/20/20 1846 01/21/20 0421 01/21/20 1918 01/22/20 0339 01/22/20 1543 01/23/20 0247  AST 29  --  27  --   --   --   --   ALT 23  --  21  --   --   --   --   ALKPHOS 198*  --  189*  --   --   --   --   BILITOT 0.5  --  0.2*  --   --   --   --   PROT 5.9*  --  5.7*  --   --   --   --   ALBUMIN 2.4*  2.4*   < > 2.2*  2.3* 2.3* 2.3* 2.4* 2.3*   < > = values in this interval not displayed.   No results for input(s): LIPASE, AMYLASE in the last 168 hours. No results for input(s): AMMONIA in the last 168 hours. Coagulation Profile: No results for input(s): INR, PROTIME in the last 168 hours. Cardiac Enzymes: No results for input(s): CKTOTAL, CKMB, CKMBINDEX, TROPONINI in the last 168 hours. BNP (last 3 results) No results for input(s): PROBNP in the last 8760 hours. HbA1C: No results for input(s): HGBA1C in the last 72 hours. CBG: Recent Labs  Lab 01/22/20 2006 01/23/20 0035 01/23/20 0445 01/23/20 0839 01/23/20 1157  GLUCAP 118* 133* 107* 112* 123*   Lipid Profile: No results for input(s): CHOL, HDL, LDLCALC, TRIG, CHOLHDL, LDLDIRECT in the last 72 hours. Thyroid Function Tests: No results for input(s): TSH, T4TOTAL, FREET4, T3FREE, THYROIDAB in the last 72 hours. Anemia Panel: No results for input(s): VITAMINB12, FOLATE, FERRITIN, TIBC, IRON, RETICCTPCT in the last 72 hours. Sepsis Labs: No results for input(s): PROCALCITON, LATICACIDVEN in the last 168 hours.  No results found for this or any previous visit (from the past 240 hour(s)).       Radiology Studies: No results found.      Scheduled Meds: . B-complex with vitamin C  1 tablet Per Tube Daily  . chlorhexidine gluconate (MEDLINE KIT)  15 mL Mouth Rinse BID  . Chlorhexidine Gluconate Cloth  6 each Topical Daily  . Chlorhexidine Gluconate Cloth  6 each Topical Q0600  . darbepoetin (ARANESP) injection - NON-DIALYSIS  100 mcg Subcutaneous Q Mon-1800  . feeding supplement (NEPRO CARB  STEADY)  1,000 mL Per Tube Q24H  . feeding supplement (PRO-STAT SUGAR FREE 64)  30 mL Per Tube TID  . guaiFENesin  10 mL Per Tube BID  . heparin  5,000 Units Subcutaneous Q8H  . insulin aspart  0-15 Units Subcutaneous Q4H  . mouth rinse  15 mL Mouth Rinse 10 times per day  . pantoprazole sodium  40 mg Per Tube Q24H  . PARoxetine  30 mg Per Tube Daily  . sodium chloride flush  10-40 mL Intracatheter Q12H  . traZODone  25 mg Per Tube QHS   Continuous Infusions: . sodium chloride       LOS: 18 days    Time spent: 35 minutes.    Irine Seal, MD [facility]   To contact the attending provider between 7A-7P or the covering provider during after hours 7P-7A, please log into the web site [email or web address] and access using universal [place] password for that web site. If you do not have the password, please call the hospital operator.  01/23/2020, 12:14 PM

## 2020-01-23 NOTE — Progress Notes (Signed)
NAME:  Bailey Cox, MRN:  [id number], DOB:  [birth year], LOS: 67 ADMISSION DATE:  01/05/2020, CONSULTATION DATE:  01/06/20 REFERRING MD:  [place] EDP, CHIEF COMPLAINT:   Shock  Brief History   74 year old female admitted 3/1 from [facility] where she had just arrived after long admission to [facility] where she suffered PEA arrest during endoscopy and ended up requiring tracheostomy. Presents to [facility] ED with widespread peripheral edema and acidosis.   History of present illness   74 year old female with PMH as below, which is significant for long admission in the [facility] system from 10/2019 all the way through 01/05/2020.  She was originally hospitalized in September 2020 with acute cholecystitis and underwent a cholecystectomy.  She was then sent to SNF for short duration but had return to [facility] to have a bile duct stent placed ultimately returning to SNF.  She then returned to [facility] with bowel dilation secondary to Ogilvie's syndrome.  While hospitalized she developed fluid overload and was ultimately transferred to [facility] for dialysis.  Her course after that point become somewhat unclear, but as described by her family she was on and off the ventilator for several procedures and was eventually left intubated.  One of these procedures was an endoscopy during which she unfortunately suffered a cardiac arrest.  Details of the arrest are unclear.  She remained on the ventilator and ultimately underwent tracheostomy.  Her hospital course was also complicated by MRSA pneumonia.  She was ultimately able to come off of hemodialysis and was treated intermittently with diuresis.  It sounds like she bounced in and out of the ICU a few times with volume overload.  On the tail end of her admission she still struggled with kidney disease and was being treated for a urinary tract infection with cefepime and vancomycin.  Plans were to stop on 3/2.  She was discharged from the [facility] system on 3/1 to  [facility] in [place].  Upon arrival to [facility] they deemed her too sick for admission and transferred her to [facility] emergency department with complaints of volume overload and acidosis.   Past Medical History   has a past medical history of Chronic respiratory failure ([place]), CKD (chronic kidney disease), DM (diabetes mellitus) ([place]), GERD (gastroesophageal reflux disease), HTN (hypertension), Ogilvie syndrome, Tracheostomy status ([place]), and Vascular dementia ([place]).  Significant Hospital Events   07/2019 admit to [facility] for cholecystitis  09/2019 tx to [facility] for renal failure/volume overload. HD started.  10/2019 PEA arrest during EGD . Intubated 11/2019 Trach  3/8 Tolerating TCT 3/9-3/12 CRRT 3/14 Tolerating HD. Will transfer to floor  Consults:  Nephrology  [facility]  Procedures:  PICC pre admit > Trach pre admit > 3/5 IJ vas cath->  Significant Diagnostic Tests:  Echo 10/24/2019 > LVEF 55%, Grade 1 DD. RV systolic function normal.  Echo 3/2: LVEF 65-70%, grade II dysfunction. RVSP  Micro Data:  Blood 3/1 >> ng Urine 3/1 >> ng resp 3/4 >> pan-sensitive pseudomonas   Antimicrobials:  Meropenem 2/21 >> off  Cefepime Pre hosp (for tracheitis )  >> 3/1-> 3/7 Vancomycin pre hosp >> 3/2   Interim history/subjective:  No events, trach capped. Still confused.  Objective   Blood pressure (!) 138/44, pulse 72, temperature 98.2 F (36.8 C), resp. rate 16, height 5\' 4"  (1.626 m), weight 96.9 kg, SpO2 100 %.    FiO2 (%):  [28 %] 28 %   Intake/Output Summary (Last 24 hours)  at 01/23/2020 1056 Last data filed at 01/23/2020 0900 Gross per 24 hour  Intake 720 ml  Output --  Net 720 ml   Filed Weights   01/20/20 1113 01/22/20 0655 01/22/20 1045  Weight: 89 kg 97.6 kg 96.9 kg   Physical Exam: GEN: no acute distress HEENT: trach capped CV: RRR, ext warm PULM: Clear, no wheeezing GI: Soft, +BS, PEG in place EXT: No edema NEURO: moves all 4 ext PSYCH:  confused SKIN: No rashes    CBC Latest Ref Rng & Units 01/23/2020 01/22/2020 01/20/2020  WBC 4.0 - 10.5 K/uL 3.6(L) 3.6(L) 4.0  Hemoglobin 12.0 - 15.0 g/dL 7.6(L) 7.7(L) 7.6(L)  Hematocrit 36.0 - 46.0 % 25.1(L) 25.8(L) 25.2(L)  Platelets 150 - 400 K/uL 71(L) 68(L) 73(L)   BMP Latest Ref Rng & Units 01/23/2020 01/22/2020 01/22/2020  Glucose 70 - 99 mg/dL 113(H) 129(H) 105(H)  BUN 8 - 23 mg/dL 47(H) 32(H) 88(H)  Creatinine 0.44 - 1.00 mg/dL 1.63(H) 1.27(H) 2.29(H)  Sodium 135 - 145 mmol/L 134(L) 134(L) 135  Potassium 3.5 - 5.1 mmol/L 4.0 4.2 4.3  Chloride 98 - 111 mmol/L 94(L) 96(L) 96(L)  CO2 22 - 32 mmol/L 30 29 30   Calcium 8.9 - 10.3 mg/dL 8.4(L) 8.3(L) 8.4(L)    Resolved problems:  Metabolic acidosis, Septic shock, Elevated lipase Acute on chronic hypoxic respiratory failure secondary to Pseudomonal HCAP s/p antibiotics x 7d Assessment & Plan:  74 year old female with chronic tracheostomy who was transferred for HCAP and AKI requiring CRRT. Now completed antibiotics and has been started on HD.  Chronic tracheostomy status, placed in 11/2018. Discussed with family who was under the impression she would be decannulated when ready however due to transfer of care from [facility], she seems to have been lost to follow-up. Off vent since 3/08 Tolerated capping 3/18-3/19  Decannulation, occlusive dressing, change daily Instruct to cover stoma with speech and cough If stoma does not close within 3 weeks refer to ENT Will sign off, call if we can be of further help  Erskine Emery MD PCCM

## 2020-01-23 NOTE — Progress Notes (Signed)
Nutrition Follow-up  DOCUMENTATION CODES:   Morbid obesity  INTERVENTION:   Continue Nepro @ 40 ml/hr via PEG  30 ml Prostat TID.    Tube feeding regimen provides 2028 kcal (100% of needs), 123 grams of protein, and 698 ml of H2O.   NUTRITION DIAGNOSIS:   Inadequate oral intake related to inability to eat as evidenced by NPO status.  Ongoing  GOAL:   Patient will meet greater than or equal to 90% of their needs  Met with TF  MONITOR:   TF tolerance  REASON FOR ASSESSMENT:   Consult, Ventilator Enteral/tube feeding initiation and management  ASSESSMENT:   Pt with PMH of DM, CKD, GERD, HTN, vascular dementia, AKI on CKD, HF, and recent long admission to [facility] from 10/08/19 - 01/05/20 with acute cholecystitis s/p cholecystectomy, bowel dilation secondary to Ogilvie's syndrome, fluid overload requiring short term HD, cardiac arrest, trach placement, MRSA pneumonia who d/c'ed from [facility] 3/1 and transferred to [facility] but was deemed too sick for admission and transferred to [facility].  3/7 CRRTstarted 3/11 CRRT stopped 3/17- trach changed (#6 cuffless to #4 cuffless) 3/18- trach capped 3/19- decannulated  Reviewed I/O's: -2.1 L x 24 hours and -21.9 L since 01/09/20  Pt resting quietly at time of visit. RD did not disturb.   Per therapy notes, pt refusing therapy. Per SLP notes, pt refusing swallow assessment. Pt remains NPO, receiving TF via PEG: Nepro @ 40 ml/hr with 30 ml Prostat TID. Regimen provides 2028 kcal (100% of needs), 123 grams of protein, and 698 ml of H2O.   TOC team searching for SNF placement.   Labs reviewed: Na: 134, K and Mg WDL, Phos: 4.8 (improving), CBGS: 107-133 (inpatient orders for glycemic control are 0-15 units insulin aspart every 4 hours).  Diet Order:   Diet Order            Diet NPO time specified  Diet effective now              EDUCATION NEEDS:   No education needs have been identified at this time  Skin:  Skin Assessment: Reviewed RN  Assessment(L groin: open wound/MASD)  Last BM:  01/19/20  Height:   Ht Readings from Last 1 Encounters:  01/05/20 _0  (1.626 m)    Weight:   Wt Readings from Last 1 Encounters:  01/22/20 96.9 kg    Ideal Body Weight:  54.5 kg  BMI:  Body mass index is 36.67 kg/m.  Estimated Nutritional Needs:   Kcal:  1700-1900  Protein:  110-136 grams  Fluid:  >1.5 L/day    Loistine Chance, RD, LDN, [place] Registered Dietitian II Certified Diabetes Care and Education Specialist Please refer to [facility] for RD and/or RD on-call/weekend/after hours pager

## 2020-01-24 DIAGNOSIS — J9611 Chronic respiratory failure with hypoxia: Secondary | ICD-10-CM | POA: Diagnosis not present

## 2020-01-24 DIAGNOSIS — N179 Acute kidney failure, unspecified: Secondary | ICD-10-CM | POA: Diagnosis not present

## 2020-01-24 DIAGNOSIS — Z930 Tracheostomy status: Secondary | ICD-10-CM | POA: Diagnosis not present

## 2020-01-24 DIAGNOSIS — J9601 Acute respiratory failure with hypoxia: Secondary | ICD-10-CM | POA: Diagnosis not present

## 2020-01-24 LAB — RENAL FUNCTION PANEL
Albumin: 2.4 g/dL — ABNORMAL LOW (ref 3.5–5.0)
Albumin: 2.5 g/dL — ABNORMAL LOW (ref 3.5–5.0)
Anion gap: 12 (ref 5–15)
Anion gap: 8 (ref 5–15)
BUN: 72 mg/dL — ABNORMAL HIGH (ref 8–23)
BUN: 33 mg/dL — ABNORMAL HIGH (ref 8–23)
CO2: 28 mmol/L (ref 22–32)
CO2: 30 mmol/L (ref 22–32)
Calcium: 8.5 mg/dL — ABNORMAL LOW (ref 8.9–10.3)
Calcium: 8 mg/dL — ABNORMAL LOW (ref 8.9–10.3)
Chloride: 94 mmol/L — ABNORMAL LOW (ref 98–111)
Chloride: 95 mmol/L — ABNORMAL LOW (ref 98–111)
Creatinine, Ser: 2.25 mg/dL — ABNORMAL HIGH (ref 0.44–1.00)
Creatinine, Ser: 1.34 mg/dL — ABNORMAL HIGH (ref 0.44–1.00)
GFR calc Af Amer: 25 mL/min — ABNORMAL LOW (ref >60)
GFR calc Af Amer: 46 mL/min — ABNORMAL LOW (ref >60)
GFR calc non Af Amer: 21 mL/min — ABNORMAL LOW (ref >60)
GFR calc non Af Amer: 40 mL/min — ABNORMAL LOW (ref >60)
Glucose, Bld: 122 mg/dL — ABNORMAL HIGH (ref 70–99)
Glucose, Bld: 126 mg/dL — ABNORMAL HIGH (ref 70–99)
Phosphorus: 6.1 mg/dL — ABNORMAL HIGH (ref 2.5–4.6)
Phosphorus: 3 mg/dL (ref 2.5–4.6)
Potassium: 4.3 mmol/L (ref 3.5–5.1)
Potassium: 3.6 mmol/L (ref 3.5–5.1)
Sodium: 134 mmol/L — ABNORMAL LOW (ref 135–145)
Sodium: 133 mmol/L — ABNORMAL LOW (ref 135–145)

## 2020-01-24 LAB — GLUCOSE, CAPILLARY
Glucose-Capillary: 103 mg/dL — ABNORMAL HIGH (ref 70–99)
Glucose-Capillary: 116 mg/dL — ABNORMAL HIGH (ref 70–99)
Glucose-Capillary: 127 mg/dL — ABNORMAL HIGH (ref 70–99)
Glucose-Capillary: 133 mg/dL — ABNORMAL HIGH (ref 70–99)
Glucose-Capillary: 149 mg/dL — ABNORMAL HIGH (ref 70–99)
Glucose-Capillary: 98 mg/dL (ref 70–99)

## 2020-01-24 LAB — CBC
HCT: 25 % — ABNORMAL LOW (ref 36.0–46.0)
Hemoglobin: 7.4 g/dL — ABNORMAL LOW (ref 12.0–15.0)
MCH: 29 pg (ref 26.0–34.0)
MCHC: 29.6 g/dL — ABNORMAL LOW (ref 30.0–36.0)
MCV: 98 fL (ref 80.0–100.0)
Platelets: 71 10*3/uL — ABNORMAL LOW (ref 150–400)
RBC: 2.55 MIL/uL — ABNORMAL LOW (ref 3.87–5.11)
RDW: 18.3 % — ABNORMAL HIGH (ref 11.5–15.5)
WBC: 4.2 10*3/uL (ref 4.0–10.5)
nRBC: 0 % (ref 0.0–0.2)

## 2020-01-24 LAB — MAGNESIUM: Magnesium: 2.5 mg/dL — ABNORMAL HIGH (ref 1.7–2.4)

## 2020-01-24 MED ORDER — NEPRO/CARBSTEADY PO LIQD
1000.0000 mL | ORAL | Status: DC
  Administered 2020-01-24 – 2020-02-15 (×27): 1000 mL via ORAL
  Filled 2020-01-24 (×5): qty 1000
  Filled 2020-01-24 (×4): qty 1500
  Filled 2020-01-24 (×3): qty 1000
  Filled 2020-01-24: qty 1500
  Filled 2020-01-24 (×2): qty 1000
  Filled 2020-01-24: qty 1500
  Filled 2020-01-24 (×2): qty 1000
  Filled 2020-01-24: qty 1500
  Filled 2020-01-24: qty 1000
  Filled 2020-01-24 (×2): qty 1500
  Filled 2020-01-24: qty 1000
  Filled 2020-01-24 (×2): qty 1500
  Filled 2020-01-24: qty 1000
  Filled 2020-01-24: qty 1500
  Filled 2020-01-24: qty 1000

## 2020-01-24 NOTE — Progress Notes (Signed)
Pt returned to 6N01 from dialysis via bed. Will continue to monitor.

## 2020-01-24 NOTE — Progress Notes (Signed)
Brief Nutrition Note  RD received page because pt's tube feeding order was not showing up in the [facility]. Added order for Nepro @40ml /hr as per RD recommendations on 3/19.  Larkin Ina, MS, RD, LDN RD pager number and weekend/on-call pager number located in [place].

## 2020-01-24 NOTE — Progress Notes (Signed)
InterDry changed as ordered. Pt tolerated well. Will continue to monitor.

## 2020-01-24 NOTE — Progress Notes (Addendum)
[facility] Progress Note    74 y.o.femalewho had a prolonged 81 day hospitalization at [facility] with  HTN COPD DM hypothyroidism Ogilvie's syndrome. PEA arrest during EGD in early December.Intermittent hyperkalemia treatet medically and with HD at OSH and now recently w/ worsening renal function over the past few days prior to admission to [facility]. She actually had a foley placed 12/28/19 when the renal function was worsening with return of purulence - started on Meropenem. Per review of chart the pt did not want RRT but after urging by family agreed to it.   Assessment/ Plan:   1. AKI on CKD with most likely cause of the AKI being ATN from sepsis. Started CRRT on 3/7-3/11 for clearance and also for the anasarca/ volume management-   stopped CRRT on 3/11.  Suspect will need ongoing RRT in the form of IHD.  Planning for IHD to keep on TTS  schedule while here via [place].  Next HD today 3/20  Getting 3-3.5L UF per rx.  Not on midodrine.    2. Sepsis -  Resolved, off all antibiotics, suspected urosepsis  3. H/o PEA arrest  4. H/o MRSA PNA  5. Respiratory failure with  Trach-->decannulated 3/19 and is doing well  6. Htn.vol- vol status getting better--> still has overload, UF as tolerated  7. Anemia-   Aranesp 100 mcg q Monday.  Iron OK when checked 3/9.  8. Dispo: pending.  Looks like she is dialysis dependent for now, since decannulated has many more options for dispo.   Subjective:    No complaints, sleeping and arousable.    Objective:   BP 132/79 (BP Location: Right Leg)   Pulse 74   Temp 97.7 F (36.5 C) (Oral)   Resp 17   Ht '5\' 4"'$  (1.626 m)   Wt 91 kg   SpO2 98%   BMI 34.44 kg/m   Intake/Output Summary (Last 24 hours) at 01/24/2020 1123 Last data filed at 01/24/2020 0919 Gross per 24 hour  Intake 650.67 ml  Output 100 ml  Net 550.67 ml   Weight change: -5.9 kg  Physical Exam: GEN: NAD, awake and alert HEENT: EOMI NECK: Supple, no thyromegaly  + trach site dressed LUNGS: CTA anteriorly CV: RRR, No M/R/G ABD: NA EXT: dependent and flank edema ACCESS: RIJ TDC  Imaging: No results found.  Labs: BMET Recent Labs  Lab 01/21/20 0421 01/21/20 1918 01/22/20 0339 01/22/20 1543 01/23/20 0247 01/23/20 1604 01/24/20 0441  NA 135 134* 135 134* 134* 133* 134*  K 4.2 4.4 4.3 4.2 4.0 4.3 4.3  CL 96* 95* 96* 96* 94* 95* 94*  CO2 '29 28 30 29 30 29 28  '$ GLUCOSE 102* 131* 105* 129* 113* 154* 122*  BUN 57* 77* 88* 32* 47* 58* 72*  CREATININE 1.70* 2.09* 2.29* 1.27* 1.63* 1.99* 2.25*  CALCIUM 8.3* 8.3* 8.4* 8.3* 8.4* 8.5* 8.5*  PHOS 5.1* 6.1* 6.6* 3.6 4.8* 5.7* 6.1*   CBC Recent Labs  Lab 01/20/20 0356 01/22/20 1543 01/23/20 0247 01/24/20 0441  WBC 4.0 3.6* 3.6* 4.2  NEUTROABS 2.8 2.6  --   --   HGB 7.6* 7.7* 7.6* 7.4*  HCT 25.2* 25.8* 25.1* 25.0*  MCV 97.7 96.6 98.8 98.0  PLT 73* 68* 71* 71*    Medications:    . B-complex with vitamin C  1 tablet Per Tube Daily  . chlorhexidine gluconate (MEDLINE KIT)  15 mL Mouth Rinse BID  . Chlorhexidine Gluconate Cloth  6 each Topical Daily  .  Chlorhexidine Gluconate Cloth  6 each Topical Q0600  . darbepoetin (ARANESP) injection - NON-DIALYSIS  100 mcg Subcutaneous Q Mon-1800  . feeding supplement (NEPRO CARB STEADY)  1,000 mL Per Tube Q24H  . feeding supplement (PRO-STAT SUGAR FREE 64)  30 mL Per Tube TID  . guaiFENesin  10 mL Per Tube BID  . heparin  5,000 Units Subcutaneous Q8H  . insulin aspart  0-15 Units Subcutaneous Q4H  . mouth rinse  15 mL Mouth Rinse 10 times per day  . pantoprazole sodium  40 mg Per Tube Q24H  . PARoxetine  30 mg Per Tube Daily  . sodium chloride flush  10-40 mL Intracatheter Q12H  . traZODone  25 mg Per Tube QHS      Clayborn Milnes  01/24/2020, 11:23 AM

## 2020-01-24 NOTE — Progress Notes (Addendum)
Pt refused pt care overnight. Refused BP to be taken, per pt it hurts. Pain med given. Pt still refuses. Pt refused sips of fluids as well. Pt yells out "leave me alone". Educated, needs further understanding.

## 2020-01-24 NOTE — Progress Notes (Signed)
Pt transported to dialysis via bed

## 2020-01-24 NOTE — Progress Notes (Addendum)
PROGRESS NOTE    Bailey Cox  GGY:[id number] DOB: 05-10-1946 DOA: 01/05/2020 PCP: Patient, No Pcp Per   Brief Narrative:  The patient is a 74 year old obese Caucasian female who was admitted on 01/05/2020 from [facility] where she had just arrived after a long admission to [facility] where she was hospitalized from 10/2019 up until 01/05/2020 where she suffered a PEA arrest during endoscopy and ended up requiring a tracheostomy.  She presented to [facility] with widespread peripheral edema and acidosis.    Originally she was hospitalized in September 2020 with acute cholecystitis and cholecystectomy.  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  While hospitalized she developed fluid overload and ultimately transferred to [facility] for dialysis.  Her clinical course after that point became somewhat unclear but family described that she was on a ventilator for several procedures and was eventually left intubated.  1 of those procedures and endoscopy which she unfortunately suffered a cardiac arrest.  Again details of the cardiac arrest or not clear but she remained on the ventilator and ultimately underwent a tracheostomy.  At that time her hospital course was also complicated by MRSA pneumonia.  She is ultimately able to come off of hemodialysis and treated with intermittent diuresis.  During her hospital stay she bounced in and out of the ICU for a few times with volume overload and at the end of her admission at [facility] she still struggled with kidney disease and was being treated for urinary tract infection with vancomycin and cefepime.  Plans were stopped this on 3 2.  She was discharged on the [facility] system on [facility] in [place] upon arrival to [facility] they deemed her too sick for admission transferred her directly to [facility] emergency department with complaints of volume overload and acidosis.   Since then she has been in ICU and has been off of the ventilator and now on trach collar.  She underwent CRRT from 3 9 until 312 and transferred to the floor on 3/14 after she was tolerating hemodialysis.  Currently nephrology has been consulted and they feel that she has an AKI on CKD most likely cause of her AKI being from ATN from sepsis and family still wants aggressive measures so she was on CRRT from 3 7 until 311 for clearance and also for anasarca and volume management.  Her CRRT was stopped on 311 and Dr. Moshe Cipro suspected that she will likely require renal replacement therapy in the setting of intermittent hemodialysis.  They feel that if she has a trach and is dialysis dependent the only option would be send her to LTAC.  The plan is for intermittent hemodialysis schedule on Tuesday Thursdays and Saturdays here via [facility].  Placement is likely going to be an issue because of this.    PT OT recommending SNF at this time.  Patient with no days left for LTAC.   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 Tracheostomy placed 11/25/2018.  Per PCCM they discussed with family  who was under the impression patient be decannulated when ready however due to transfer of care from [facility] patient was lost to follow-up. patient admitted for anasarca and acute respiratory failure had to be placed on the vent as well as dialyzed with some clinical improvement and placed back on trach collar.  Patient with clear lung sounds.  Was initially rhonchorous however has improved.  Status post IV antibiotics of cefepime and vancomycin.  Patient  off ventilator since 01/12/2020.  Placed on Mucinex twice daily.  Speech therapy consulted for use of Passy-Muir valve.  Chest PT. per pulmonary patient tolerated capping 3/18-3/19. Patient s/p decannulation. Per pulmonary.    2.  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  Pulmonary following and feel patient is not a great candidate for long-term dialysis due to trach.  Dr. Augustin Coupe had spoken with family and they wanted RRT.  Patient was on dialysis off and on while at [facility] and due to get dialysis while at [place].  Patient noted to have intermittent HD on 01/09/2020.  Patient started on CRRT on 372 01/15/2020 for clearance and also for anasarca/volume management.  CRRT was stopped on 01/15/2020.  Nephrology following and planning for intermittent HD to keep on Tuesday Thursday Saturday schedule while in-house versus TDC.  Patient underwent hemodialysis on 01/22/2020 per nephrology.  3.  Well-controlled diabetes type 2 CBG of 116 this morning.  Hemoglobin A1c 5.7.  Continue sliding scale insulin.  Follow.  4.  Anemia of critical illness/anemia of chronic disease/thrombocytopenia Patient with no overt bleeding.  Hemoglobin currently at 7.4.  Platelet count at 71.  Anemia panel with iron of 81, ferritin of 886.  Patient receiving Aranesp per nephrology.  Follow H&H.  Transfusion threshold hemoglobin < 7 which would likely need to be done in hemodialysis if needed.  5.  Hyponatremia Improved with hemodialysis.  Follow.  6.  Anxiety Continue current dose of Paxil at half home dose and alprazolam 3 times daily as needed.  Trazodone per tube nightly.  7.  Gastroesophageal reflux disease/ PPI.    8.  Left upper extremity swelling In the setting of anasarca.  Improved.  Upper extremity duplex negative for DVT.  Follow.  9.  Obesity  10.  Nutrition Patient started on Nephro-Vite 40 cc/h via PEG.  Continue 30 cc prostat 3 times daily.   Dietitian following.    DVT prophylaxis: Heparin Code Status: Full Family Communication: Updated patient.  Updated daughter and granddaughter at bedside.  Disposition Plan:  . Patient came from: [facility]            . Anticipated d/c place: SNF . Barriers to d/c OR conditions which need to be met to effect a safe d/c: Patient was in the ICU for 14 days status post tracheostomy required intermittent dialysis.  Patient has been refused for [facility] as well as select specialty LTAC.  Likely to skilled nursing facility however will need to have a cuffless trach and be able to sit up for dialysis so difficult placement at this time.   Consultants:   Nephrology: Dr. Augustin Coupe 01/06/2020  Wound care  Entered interventional radiology  Procedures:  PICC pre admit > Trach pre admit > 3/5 IJ vas cath-> Left upper extremity Doppler negative for DVT 01/19/2020  Significant Diagnostic Tests:  Echo 10/24/2019>LVEF 55%, Grade 1 DD. RV systolic function normal.  Echo 3/2: LVEF 65-70%, grade II dysfunction. RVSP   Antimicrobials:  Meropenem  2/21 >>off  Cefepime Pre hosp (for tracheitis ) >>3/1->3/7 Vancomycin pre hosp >>3/2  IV vancomycin 01/10/2020>>>> 01/13/2020    Micro Data:  Blood 3/1 >> ng Urine 3/1 >> ng resp 3/4 >>pan-sensitivepseudomonas    Echo 10/24/2019>LVEF 55%, Grade 1 DD. RV systolic function normal.  Echo 3/2: LVEF 65-70%, grade II dysfunction. RVSP     Subjective: Patient sleeping.  Arousable.  Patient denies any chest pain or shortness of breath.  Daughter and granddaughter at bedside.  Objective: Vitals:   01/23/20 0826 01/23/20 1213 01/24/20 0231 01/24/20 0612  BP:   132/79   Pulse: 72 69 74 74  Resp: '16 16 17 17  '$ Temp:    97.7 F (36.5 C)  TempSrc:    Oral  SpO2: 100% 100% 100% 98%  Weight:    91 kg  Height:        Intake/Output Summary (Last 24 hours) at 01/24/2020 1211 Last data filed at 01/24/2020 0919 Gross per 24 hour  Intake 650.67  ml  Output 100 ml  Net 550.67 ml   Filed Weights   01/22/20 0655 01/22/20 1045 01/24/20 0612  Weight: 97.6 kg 96.9 kg 91 kg    Examination:  General exam: NAD Respiratory system: Clear to auscultation bilaterally anterior lung fields.  No wheezes, no crackles, no rhonchi.  Trach decannulated.  Cardiovascular system: RRR no murmurs rubs or gallops.  No JVD.  No lower extremity edema.  Gastrointestinal system: Abdomen is nontender, nondistended, soft, positive bowel sounds.  No rebound.  No guarding.  G-tube intact.  Central nervous system: Alert and oriented. No focal neurological deficits. Extremities: Symmetric 5 x 5 power. Skin: No rashes, lesions or ulcers Psychiatry: Judgement and insight appear normal. Mood & affect appropriate.     Data Reviewed: I have personally reviewed following labs and imaging studies  CBC: Recent Labs  Lab 01/19/20 0315 01/20/20 0356 01/22/20 1543 01/23/20 0247 01/24/20 0441  WBC 4.3 4.0 3.6* 3.6* 4.2  NEUTROABS  --  2.8 2.6  --   --   HGB 7.1* 7.6* 7.7* 7.6* 7.4*  HCT 24.1* 25.2* 25.8* 25.1* 25.0*  MCV 98.8 97.7 96.6 98.8 98.0  PLT 77* 73* 68* 71* 71*   Basic Metabolic Panel: Recent Labs  Lab 01/20/20 0356 01/20/20 1846 01/21/20 0421 01/21/20 1918 01/22/20 0339 01/22/20 1543 01/23/20 0247 01/23/20 1604 01/24/20 0441  NA 136  135   < > 135   < > 135 134* 134* 133* 134*  K 5.0  5.0   < > 4.2   < > 4.3 4.2 4.0 4.3 4.3  CL 97*  95*   < > 96*   < > 96* 96* 94* 95* 94*  CO2 29  29   < > 29   < > '30 29 30 29 28  '$ GLUCOSE 121*  121*   < > 102*   < > 105* 129* 113* 154* 122*  BUN 86*  87*   < > 57*   < > 88* 32* 47* 58* 72*  CREATININE 2.53*  2.42*   < > 1.70*   < > 2.29* 1.27* 1.63* 1.99* 2.25*  CALCIUM 8.5*  8.5*   < > 8.3*   < > 8.4* 8.3* 8.4* 8.5* 8.5*  MG 2.6*  --  2.3  --  2.5*  --  2.4  --  2.5*  PHOS 7.0*  6.9*   < > 5.1*   < > 6.6* 3.6 4.8* 5.7* 6.1*   < > =  values in this interval not displayed.   GFR: Estimated  Creatinine Clearance: 25.1 mL/min (A) (by C-G formula based on SCr of 2.25 mg/dL (H)). Liver Function Tests: Recent Labs  Lab 01/20/20 0356 01/20/20 1846 01/21/20 0421 01/21/20 1918 01/22/20 0339 01/22/20 1543 01/23/20 0247 01/23/20 1604 01/24/20 0441  AST 29  --  27  --   --   --   --   --   --   ALT 23  --  21  --   --   --   --   --   --   ALKPHOS 198*  --  189*  --   --   --   --   --   --   BILITOT 0.5  --  0.2*  --   --   --   --   --   --   PROT 5.9*  --  5.7*  --   --   --   --   --   --   ALBUMIN 2.4*  2.4*   < > 2.2*  2.3*   < > 2.3* 2.4* 2.3* 2.5* 2.4*   < > = values in this interval not displayed.   No results for input(s): LIPASE, AMYLASE in the last 168 hours. No results for input(s): AMMONIA in the last 168 hours. Coagulation Profile: No results for input(s): INR, PROTIME in the last 168 hours. Cardiac Enzymes: No results for input(s): CKTOTAL, CKMB, CKMBINDEX, TROPONINI in the last 168 hours. BNP (last 3 results) No results for input(s): PROBNP in the last 8760 hours. HbA1C: No results for input(s): HGBA1C in the last 72 hours. CBG: Recent Labs  Lab 01/23/20 1649 01/23/20 1946 01/24/20 0118 01/24/20 0355 01/24/20 0753  GLUCAP 127* 129* 98 116* 133*   Lipid Profile: No results for input(s): CHOL, HDL, LDLCALC, TRIG, CHOLHDL, LDLDIRECT in the last 72 hours. Thyroid Function Tests: No results for input(s): TSH, T4TOTAL, FREET4, T3FREE, THYROIDAB in the last 72 hours. Anemia Panel: No results for input(s): VITAMINB12, FOLATE, FERRITIN, TIBC, IRON, RETICCTPCT in the last 72 hours. Sepsis Labs: No results for input(s): PROCALCITON, LATICACIDVEN in the last 168 hours.  No results found for this or any previous visit (from the past 240 hour(s)).       Radiology Studies: No results found.      Scheduled Meds: . B-complex with vitamin C  1 tablet Per Tube Daily  . chlorhexidine gluconate (MEDLINE KIT)  15 mL Mouth Rinse BID  . Chlorhexidine  Gluconate Cloth  6 each Topical Daily  . Chlorhexidine Gluconate Cloth  6 each Topical Q0600  . darbepoetin (ARANESP) injection - NON-DIALYSIS  100 mcg Subcutaneous Q Mon-1800  . feeding supplement (NEPRO CARB STEADY)  1,000 mL Per Tube Q24H  . feeding supplement (PRO-STAT SUGAR FREE 64)  30 mL Per Tube TID  . guaiFENesin  10 mL Per Tube BID  . heparin  5,000 Units Subcutaneous Q8H  . insulin aspart  0-15 Units Subcutaneous Q4H  . mouth rinse  15 mL Mouth Rinse 10 times per day  . pantoprazole sodium  40 mg Per Tube Q24H  . PARoxetine  30 mg Per Tube Daily  . sodium chloride flush  10-40 mL Intracatheter Q12H  . traZODone  25 mg Per Tube QHS   Continuous Infusions: . sodium chloride       LOS: 19 days    Time spent: 35 minutes.    Irine Seal, MD [facility]   To  contact the attending provider between 7A-7P or the covering provider during after hours 7P-7A, please log into the web site [email or web address] and access using universal [place] password for that web site. If you do not have the password, please call the hospital operator.  01/24/2020, 12:11 PM

## 2020-01-24 NOTE — Plan of Care (Signed)
  Problem: Education: Goal: Knowledge of General Education information will improve Description: Including pain rating scale, medication(s)/side effects and non-pharmacologic comfort measures Outcome: Progressing   Problem: Health Behavior/Discharge Planning: Goal: Ability to manage health-related needs will improve Outcome: Progressing   Problem: Clinical Measurements: Goal: Ability to maintain clinical measurements within normal limits will improve Outcome: Progressing   Problem: Activity: Goal: Risk for activity intolerance will decrease Outcome: Progressing   Problem: Elimination: Goal: Will not experience complications related to bowel motility Outcome: Progressing   Problem: Pain Managment: Goal: General experience of comfort will improve Outcome: Progressing   Problem: Safety: Goal: Ability to remain free from injury will improve Outcome: Progressing   Problem: Skin Integrity: Goal: Risk for impaired skin integrity will decrease Outcome: Progressing

## 2020-01-25 DIAGNOSIS — J9601 Acute respiratory failure with hypoxia: Secondary | ICD-10-CM | POA: Diagnosis not present

## 2020-01-25 DIAGNOSIS — N179 Acute kidney failure, unspecified: Secondary | ICD-10-CM | POA: Diagnosis not present

## 2020-01-25 DIAGNOSIS — Z930 Tracheostomy status: Secondary | ICD-10-CM | POA: Diagnosis not present

## 2020-01-25 DIAGNOSIS — J9611 Chronic respiratory failure with hypoxia: Secondary | ICD-10-CM | POA: Diagnosis not present

## 2020-01-25 LAB — RENAL FUNCTION PANEL
Albumin: 2.4 g/dL — ABNORMAL LOW (ref 3.5–5.0)
Albumin: 2.5 g/dL — ABNORMAL LOW (ref 3.5–5.0)
Anion gap: 8 (ref 5–15)
Anion gap: 10 (ref 5–15)
BUN: 41 mg/dL — ABNORMAL HIGH (ref 8–23)
BUN: 53 mg/dL — ABNORMAL HIGH (ref 8–23)
CO2: 31 mmol/L (ref 22–32)
CO2: 30 mmol/L (ref 22–32)
Calcium: 8.4 mg/dL — ABNORMAL LOW (ref 8.9–10.3)
Calcium: 8.6 mg/dL — ABNORMAL LOW (ref 8.9–10.3)
Chloride: 97 mmol/L — ABNORMAL LOW (ref 98–111)
Chloride: 94 mmol/L — ABNORMAL LOW (ref 98–111)
Creatinine, Ser: 1.66 mg/dL — ABNORMAL HIGH (ref 0.44–1.00)
Creatinine, Ser: 1.98 mg/dL — ABNORMAL HIGH (ref 0.44–1.00)
GFR calc Af Amer: 36 mL/min — ABNORMAL LOW (ref >60)
GFR calc Af Amer: 29 mL/min — ABNORMAL LOW (ref >60)
GFR calc non Af Amer: 31 mL/min — ABNORMAL LOW (ref >60)
GFR calc non Af Amer: 25 mL/min — ABNORMAL LOW (ref >60)
Glucose, Bld: 122 mg/dL — ABNORMAL HIGH (ref 70–99)
Glucose, Bld: 135 mg/dL — ABNORMAL HIGH (ref 70–99)
Phosphorus: 3.4 mg/dL (ref 2.5–4.6)
Phosphorus: 4.7 mg/dL — ABNORMAL HIGH (ref 2.5–4.6)
Potassium: 3.7 mmol/L (ref 3.5–5.1)
Potassium: 4.2 mmol/L (ref 3.5–5.1)
Sodium: 136 mmol/L (ref 135–145)
Sodium: 134 mmol/L — ABNORMAL LOW (ref 135–145)

## 2020-01-25 LAB — HEMOGLOBIN AND HEMATOCRIT, BLOOD
HCT: 25 % — ABNORMAL LOW (ref 36.0–46.0)
Hemoglobin: 7.5 g/dL — ABNORMAL LOW (ref 12.0–15.0)

## 2020-01-25 LAB — GLUCOSE, CAPILLARY
Glucose-Capillary: 112 mg/dL — ABNORMAL HIGH (ref 70–99)
Glucose-Capillary: 127 mg/dL — ABNORMAL HIGH (ref 70–99)
Glucose-Capillary: 102 mg/dL — ABNORMAL HIGH (ref 70–99)
Glucose-Capillary: 148 mg/dL — ABNORMAL HIGH (ref 70–99)
Glucose-Capillary: 130 mg/dL — ABNORMAL HIGH (ref 70–99)

## 2020-01-25 LAB — MAGNESIUM: Magnesium: 2.3 mg/dL (ref 1.7–2.4)

## 2020-01-25 NOTE — Progress Notes (Signed)
PROGRESS NOTE    Bailey Cox  QQI:[id number] DOB: [birth year] DOA: 01/05/2020 PCP: Patient, No Pcp Per   Brief Narrative:  The patient is a 74 year old obese Caucasian female who was admitted on 01/05/2020 from [facility] where she had just arrived after a long admission to [facility] where she was hospitalized from 10/2019 up until 01/05/2020 where she suffered a PEA arrest during endoscopy and ended up requiring a tracheostomy.  She presented to [facility] with widespread peripheral edema and acidosis.    Originally she was hospitalized in September 2020 with acute cholecystitis and cholecystectomy.  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  While hospitalized she developed fluid overload and ultimately transferred to [facility] for dialysis.  Her clinical course after that point became somewhat unclear but family described that she was on a ventilator for several procedures and was eventually left intubated.  1 of those procedures and endoscopy which she unfortunately suffered a cardiac arrest.  Again details of the cardiac arrest or not clear but she remained on the ventilator and ultimately underwent a tracheostomy.  At that time her hospital course was also complicated by MRSA pneumonia.  She is ultimately able to come off of hemodialysis and treated with intermittent diuresis.  During her hospital stay she bounced in and out of the ICU for a few times with volume overload and at the end of her admission at [facility] she still struggled with kidney disease and was being treated for urinary tract infection with vancomycin and cefepime.  Plans were stopped this on 3 2.  She was discharged on the [facility] system on [facility] in [place] upon arrival to [facility] they deemed her too sick for admission transferred her directly to [facility] emergency department with complaints of volume overload and acidosis.   Since then she has been in ICU and has been off of the ventilator and now on trach collar.  She underwent CRRT from 3 9 until 312 and transferred to the floor on 3/14 after she was tolerating hemodialysis.  Currently nephrology has been consulted and they feel that she has an AKI on CKD most likely cause of her AKI being from ATN from sepsis and family still wants aggressive measures so she was on CRRT from 3 7 until 311 for clearance and also for anasarca and volume management.  Her CRRT was stopped on 311 and Dr. Moshe Cipro suspected that she will likely require renal replacement therapy in the setting of intermittent hemodialysis.  They feel that if she has a trach and is dialysis dependent the only option would be send her to LTAC.  The plan is for intermittent hemodialysis schedule on Tuesday Thursdays and Saturdays here via [facility].  Placement is likely going to be an issue because of this.    PT OT recommending SNF at this time.  Patient with no days left for LTAC.   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 Tracheostomy placed 11/25/2018.  Per PCCM they discussed with family  who was under the impression patient be decannulated when ready however due to transfer of care from [facility] patient was lost to follow-up. patient admitted for anasarca and acute respiratory failure had to be placed on the vent as well as dialyzed with some clinical improvement and placed back on trach collar.  Patient with clear lung sounds.  Was initially rhonchorous however has improved.  Status post IV antibiotics of cefepime and vancomycin.  Patient  off ventilator since 01/12/2020.  Was on Mucinex twice daily which has subsequently been discontinued. Speech therapy consulted for use of Passy-Muir valve.  Chest PT. per pulmonary patient tolerated capping 3/18-3/19. Patient s/p decannulation. Per pulmonary.    2.  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  Pulmonary following and feel patient is not a great candidate for long-term dialysis due to trach.  Dr. Augustin Coupe had spoken with family and they wanted RRT.  Patient was on dialysis off and on while at [facility] and due to get dialysis while at [place].  Patient noted to have intermittent HD on 01/09/2020.  Patient started on CRRT on 372 01/15/2020 for clearance and also for anasarca/volume management.  CRRT was stopped on 01/15/2020.  Nephrology following and planning for intermittent HD to keep on Tuesday Thursday Saturday schedule while in-house versus TDC.  Patient underwent hemodialysis on 01/22/2020, 01/24/2020 per nephrology.  3.  Well-controlled diabetes type 2 CBG of 127 this morning.  Hemoglobin A1c 5.7.  Continue sliding scale insulin.  Follow.  4.  Anemia of critical illness/anemia of chronic disease/thrombocytopenia Patient with no overt bleeding.  Hemoglobin currently at 7.5.  Anemia panel with iron of 81, ferritin of 886.  Patient receiving Aranesp per nephrology.  Follow H&H.  Transfusion threshold hemoglobin < 7 which would likely need to be done in hemodialysis if needed.  5.  Hyponatremia Improved with hemodialysis.  Follow.  6.  Anxiety Continue current dose of Paxil at half home dose and alprazolam 3 times daily as needed.  Trazodone per tube nightly.  7.  Gastroesophageal reflux disease/ PPI.    8.  Left upper extremity swelling In the setting of anasarca.  Improved.  Upper extremity Dopplers negative for DVT.    9.  Obesity  10.  Nutrition Patient started on Nephro-Vite 40 cc/h via PEG.  Continue 30 cc  prostat 3 times daily.  Dietitian following.    DVT prophylaxis: Heparin Code Status: Full Family Communication: Updated patient.  Disposition Plan:  . Patient came from: [facility]            . Anticipated d/c place: SNF . Barriers to d/c OR conditions which need to be met to effect a safe d/c: Patient was in the ICU for 14 days status post tracheostomy required intermittent dialysis.  Patient has been refused for [facility] as well as select specialty LTAC.  Likely to skilled nursing facility now patient has been decannulated and when able to sit up for dialysis.  Social work consulted.    Consultants:   Nephrology: Dr. Augustin Coupe 01/06/2020  Wound care  Entered interventional radiology  Procedures:  PICC pre admit > Trach pre admit > 3/5 IJ vas cath-> Left upper extremity Doppler negative for DVT 01/19/2020  Significant Diagnostic Tests:  Echo 10/24/2019>LVEF 55%, Grade 1 DD. RV systolic function normal.  Echo 3/2: LVEF 65-70%, grade II dysfunction. RVSP   Antimicrobials:  Meropenem 2/21 >>off  Cefepime Pre hosp (for tracheitis ) >>3/1->3/7 Vancomycin  pre hosp >>3/2  IV vancomycin 01/10/2020>>>> 01/13/2020    Micro Data:  Blood 3/1 >> ng Urine 3/1 >> ng resp 3/4 >>pan-sensitivepseudomonas    Echo 10/24/2019>LVEF 55%, Grade 1 DD. RV systolic function normal.  Echo 3/2: LVEF 65-70%, grade II dysfunction. RVSP     Subjective: Patient sleeping but arousable.  Denies chest pain or shortness of breath by shaking her head.    Objective: Vitals:   01/24/20 1830 01/24/20 1838 01/24/20 2339 01/25/20 0406  BP: 138/62 (!) 142/67 (!) 146/50 (!) 152/60  Pulse: 72 72 81 82  Resp: _0 Temp:  98.7 F (37.1 C) 98.6 F (37 C) 97.6 F (36.4 C)  TempSrc:  Oral  Oral  SpO2:  94% 98% 99%  Weight:      Height:        Intake/Output Summary (Last 24 hours) at 01/25/2020 1217 Last data filed at 01/25/2020 1059 Gross per 24 hour  Intake 0 ml  Output 3000 ml    Net -3000 ml   Filed Weights   01/22/20 1045 01/24/20 0612 01/24/20 1530  Weight: 96.9 kg 91 kg 91.4 kg    Examination:  General exam: NAD Respiratory system: Clear to auscultation anterior lung fields.  No wheezes, no crackles, no rhonchi.  Trach decannulated.  Cardiovascular system: Regular rate rhythm no murmurs rubs or gallops.  No JVD.  No lower extremity edema. Gastrointestinal system: Abdomen is soft, nontender, nondistended, positive bowel sounds.  No rebound.  No guarding.  G-tube intact.  Central nervous system: Alert and oriented. No focal neurological deficits. Extremities: Symmetric 5 x 5 power. Skin: No rashes, lesions or ulcers Psychiatry: Judgement and insight appear normal. Mood & affect appropriate.     Data Reviewed: I have personally reviewed following labs and imaging studies  CBC: Recent Labs  Lab 01/19/20 0315 01/19/20 0315 01/20/20 0356 01/22/20 1543 01/23/20 0247 01/24/20 0441 01/25/20 0354  WBC 4.3  --  4.0 3.6* 3.6* 4.2  --   NEUTROABS  --   --  2.8 2.6  --   --   --   HGB 7.1*   < > 7.6* 7.7* 7.6* 7.4* 7.5*  HCT 24.1*   < > 25.2* 25.8* 25.1* 25.0* 25.0*  MCV 98.8  --  97.7 96.6 98.8 98.0  --   PLT 77*  --  73* 68* 71* 71*  --    < > = values in this interval not displayed.   Basic Metabolic Panel: Recent Labs  Lab 01/21/20 0421 01/21/20 1918 01/22/20 0339 01/22/20 1543 01/23/20 0247 01/23/20 1604 01/24/20 0441 01/24/20 1950 01/25/20 0354  NA 135   < > 135   < > 134* 133* 134* 133* 136  K 4.2   < > 4.3   < > 4.0 4.3 4.3 3.6 3.7  CL 96*   < > 96*   < > 94* 95* 94* 95* 97*  CO2 29   < > 30   < > _1 GLUCOSE 102*   < > 105*   < > 113* 154* 122* 126* 122*  BUN 57*   < > 88*   < > 47* 58* 72* 33* 41*  CREATININE 1.70*   < > 2.29*   < > 1.63* 1.99* 2.25* 1.34* 1.66*  CALCIUM 8.3*   < > 8.4*   < > 8.4* 8.5* 8.5* 8.0* 8.4*  MG 2.3  --  2.5*  --  2.4  --  2.5*  --  2.3  PHOS 5.1*   < > 6.6*   < > 4.8* 5.7* 6.1* 3.0 3.4   <  > = values in this interval not displayed.   GFR: Estimated Creatinine Clearance: 34.1 mL/min (A) (by C-G formula based on SCr of 1.66 mg/dL (H)). Liver Function Tests: Recent Labs  Lab 01/20/20 0356 01/20/20 1846 01/21/20 0421 01/21/20 1918 01/23/20 0247 01/23/20 1604 01/24/20 0441 01/24/20 1950 01/25/20 0354  AST 29  --  27  --   --   --   --   --   --   ALT 23  --  21  --   --   --   --   --   --   ALKPHOS 198*  --  189*  --   --   --   --   --   --   BILITOT 0.5  --  0.2*  --   --   --   --   --   --   PROT 5.9*  --  5.7*  --   --   --   --   --   --   ALBUMIN 2.4*  2.4*   < > 2.2*  2.3*   < > 2.3* 2.5* 2.4* 2.5* 2.4*   < > = values in this interval not displayed.   No results for input(s): LIPASE, AMYLASE in the last 168 hours. No results for input(s): AMMONIA in the last 168 hours. Coagulation Profile: No results for input(s): INR, PROTIME in the last 168 hours. Cardiac Enzymes: No results for input(s): CKTOTAL, CKMB, CKMBINDEX, TROPONINI in the last 168 hours. BNP (last 3 results) No results for input(s): PROBNP in the last 8760 hours. HbA1C: No results for input(s): HGBA1C in the last 72 hours. CBG: Recent Labs  Lab 01/24/20 1222 01/24/20 2016 01/24/20 2355 01/25/20 0400 01/25/20 0817  GLUCAP 103* 127* 149* 112* 127*   Lipid Profile: No results for input(s): CHOL, HDL, LDLCALC, TRIG, CHOLHDL, LDLDIRECT in the last 72 hours. Thyroid Function Tests: No results for input(s): TSH, T4TOTAL, FREET4, T3FREE, THYROIDAB in the last 72 hours. Anemia Panel: No results for input(s): VITAMINB12, FOLATE, FERRITIN, TIBC, IRON, RETICCTPCT in the last 72 hours. Sepsis Labs: No results for input(s): PROCALCITON, LATICACIDVEN in the last 168 hours.  No results found for this or any previous visit (from the past 240 hour(s)).       Radiology Studies: No results found.      Scheduled Meds: . B-complex with vitamin C  1 tablet Per Tube Daily  . chlorhexidine  gluconate (MEDLINE KIT)  15 mL Mouth Rinse BID  . Chlorhexidine Gluconate Cloth  6 each Topical Daily  . Chlorhexidine Gluconate Cloth  6 each Topical Q0600  . darbepoetin (ARANESP) injection - NON-DIALYSIS  100 mcg Subcutaneous Q Mon-1800  . feeding supplement (NEPRO CARB STEADY)  1,000 mL Oral Q24H  . feeding supplement (PRO-STAT SUGAR FREE 64)  30 mL Per Tube TID  . guaiFENesin  10 mL Per Tube BID  . heparin  5,000 Units Subcutaneous Q8H  . insulin aspart  0-15 Units Subcutaneous Q4H  . mouth rinse  15 mL Mouth Rinse 10 times per day  . pantoprazole sodium  40 mg Per Tube Q24H  . PARoxetine  30 mg Per Tube Daily  . sodium chloride flush  10-40 mL Intracatheter Q12H  . traZODone  25 mg Per Tube QHS   Continuous Infusions: . sodium chloride  LOS: 20 days    Time spent: 35 minutes.    Irine Seal, MD [facility]   To contact the attending provider between 7A-7P or the covering provider during after hours 7P-7A, please log into the web site [email or web address] and access using universal [place] password for that web site. If you do not have the password, please call the hospital operator.  01/25/2020, 12:17 PM

## 2020-01-25 NOTE — Progress Notes (Signed)
[facility] Progress Note    74 y.o.femalewho had a prolonged 81 day hospitalization at [facility] with  HTN COPD DM hypothyroidism Ogilvie's syndrome. PEA arrest during EGD in early December.Intermittent hyperkalemia treatet medically and with HD at OSH and now recently w/ worsening renal function over the past few days prior to admission to [facility]. She actually had a foley placed 12/28/19 when the renal function was worsening with return of purulence - started on Meropenem. Per review of chart the pt did not want RRT but after urging by family agreed to it.   Assessment/ Plan:   1. AKI on CKD with most likely cause of the AKI being ATN from sepsis. Started CRRT on 3/7-3/11 for clearance and also for the anasarca/ volume management-   stopped CRRT on 3/11.  Suspect will need ongoing RRT in the form of IHD.  Planning for IHD to keep on TTS  schedule while here via [place].  Next HD 3/23.  Getting 3-3.5L UF per rx.  Not on midodrine.    2. Sepsis -  Resolved, off all antibiotics, suspected urosepsis  3. H/o PEA arrest  4. H/o MRSA PNA  5. Respiratory failure with  Trach-->decannulated 3/19 and is doing well  6. Htn.vol- vol status getting better--> still has overload, UF as tolerated  7. Anemia-   Aranesp 100 mcg q Monday.  Iron OK when checked 3/9.  8. Dispo: pending.  Looks like she is dialysis dependent for now, since decannulated has many more options for dispo.   Subjective:    No complaints, sleeping and arousable.    Objective:   BP (!) 152/60   Pulse 82   Temp 97.6 F (36.4 C) (Oral)   Resp 18   Ht '5\' 4"'$  (1.626 m)   Wt 91.4 kg   SpO2 99%   BMI 34.59 kg/m   Intake/Output Summary (Last 24 hours) at 01/25/2020 1109 Last data filed at 01/25/2020 1059 Gross per 24 hour  Intake 0 ml  Output 3000 ml  Net -3000 ml   Weight change: 0.4 kg  Physical Exam: GEN: NAD, awake and alert HEENT: EOMI NECK: Supple, no thyromegaly + trach site dressed LUNGS:  CTA anteriorly CV: RRR, No M/R/G ABD: NA EXT: dependent and flank edema ACCESS: RIJ TDC  Imaging: No results found.  Labs: BMET Recent Labs  Lab 01/22/20 0339 01/22/20 1543 01/23/20 0247 01/23/20 1604 01/24/20 0441 01/24/20 1950 01/25/20 0354  NA 135 134* 134* 133* 134* 133* 136  K 4.3 4.2 4.0 4.3 4.3 3.6 3.7  CL 96* 96* 94* 95* 94* 95* 97*  CO2 '30 29 30 29 28 30 31  '$ GLUCOSE 105* 129* 113* 154* 122* 126* 122*  BUN 88* 32* 47* 58* 72* 33* 41*  CREATININE 2.29* 1.27* 1.63* 1.99* 2.25* 1.34* 1.66*  CALCIUM 8.4* 8.3* 8.4* 8.5* 8.5* 8.0* 8.4*  PHOS 6.6* 3.6 4.8* 5.7* 6.1* 3.0 3.4   CBC Recent Labs  Lab 01/20/20 0356 01/20/20 0356 01/22/20 1543 01/23/20 0247 01/24/20 0441 01/25/20 0354  WBC 4.0  --  3.6* 3.6* 4.2  --   NEUTROABS 2.8  --  2.6  --   --   --   HGB 7.6*   < > 7.7* 7.6* 7.4* 7.5*  HCT 25.2*   < > 25.8* 25.1* 25.0* 25.0*  MCV 97.7  --  96.6 98.8 98.0  --   PLT 73*  --  68* 71* 71*  --    < > = values  in this interval not displayed.    Medications:    . B-complex with vitamin C  1 tablet Per Tube Daily  . chlorhexidine gluconate (MEDLINE KIT)  15 mL Mouth Rinse BID  . Chlorhexidine Gluconate Cloth  6 each Topical Daily  . Chlorhexidine Gluconate Cloth  6 each Topical Q0600  . darbepoetin (ARANESP) injection - NON-DIALYSIS  100 mcg Subcutaneous Q Mon-1800  . feeding supplement (NEPRO CARB STEADY)  1,000 mL Oral Q24H  . feeding supplement (PRO-STAT SUGAR FREE 64)  30 mL Per Tube TID  . guaiFENesin  10 mL Per Tube BID  . heparin  5,000 Units Subcutaneous Q8H  . insulin aspart  0-15 Units Subcutaneous Q4H  . mouth rinse  15 mL Mouth Rinse 10 times per day  . pantoprazole sodium  40 mg Per Tube Q24H  . PARoxetine  30 mg Per Tube Daily  . sodium chloride flush  10-40 mL Intracatheter Q12H  . traZODone  25 mg Per Tube QHS      Sriman Tally  01/25/2020, 11:09 AM

## 2020-01-25 NOTE — Progress Notes (Signed)
InterDry changed as ordered. Pt tolerated well. Will continue to monitor.

## 2020-01-25 NOTE — Plan of Care (Signed)
  Problem: Education: Goal: Knowledge of General Education information will improve Description: Including pain rating scale, medication(s)/side effects and non-pharmacologic comfort measures 01/25/2020 0800 by Racheal Patches, RN Outcome: Progressing 01/25/2020 0759 by Racheal Patches, RN Outcome: Progressing   Problem: Health Behavior/Discharge Planning: Goal: Ability to manage health-related needs will improve 01/25/2020 0800 by Racheal Patches, RN Outcome: Progressing 01/25/2020 0759 by Racheal Patches, RN Outcome: Progressing   Problem: Clinical Measurements: Goal: Ability to maintain clinical measurements within normal limits will improve 01/25/2020 0800 by Racheal Patches, RN Outcome: Progressing 01/25/2020 0759 by Racheal Patches, RN Outcome: Progressing Goal: Respiratory complications will improve 01/25/2020 0800 by Racheal Patches, RN Outcome: Progressing 01/25/2020 0759 by Racheal Patches, RN Outcome: Progressing Goal: Cardiovascular complication will be avoided Outcome: Progressing   Problem: Nutrition: Goal: Adequate nutrition will be maintained 01/25/2020 0800 by Racheal Patches, RN Outcome: Progressing 01/25/2020 0759 by Racheal Patches, RN Outcome: Progressing   Problem: Elimination: Goal: Will not experience complications related to bowel motility 01/25/2020 0800 by Racheal Patches, RN Outcome: Progressing 01/25/2020 0759 by Racheal Patches, RN Outcome: Progressing   Problem: Pain Managment: Goal: General experience of comfort will improve 01/25/2020 0800 by Racheal Patches, RN Outcome: Progressing 01/25/2020 0759 by Racheal Patches, RN Outcome: Progressing   Problem: Safety: Goal: Ability to remain free from injury will improve 01/25/2020 0800 by Racheal Patches, RN Outcome: Progressing 01/25/2020 0759 by Racheal Patches, RN Outcome: Progressing   Problem: Skin Integrity: Goal: Risk for impaired skin integrity will  decrease 01/25/2020 0800 by Racheal Patches, RN Outcome: Progressing 01/25/2020 0759 by Racheal Patches, RN Outcome: Progressing

## 2020-01-26 DIAGNOSIS — N179 Acute kidney failure, unspecified: Secondary | ICD-10-CM | POA: Diagnosis not present

## 2020-01-26 DIAGNOSIS — J9601 Acute respiratory failure with hypoxia: Secondary | ICD-10-CM | POA: Diagnosis not present

## 2020-01-26 DIAGNOSIS — J9611 Chronic respiratory failure with hypoxia: Secondary | ICD-10-CM | POA: Diagnosis not present

## 2020-01-26 DIAGNOSIS — Z930 Tracheostomy status: Secondary | ICD-10-CM | POA: Diagnosis not present

## 2020-01-26 LAB — CBC WITH DIFFERENTIAL/PLATELET
Abs Immature Granulocytes: 0.01 10*3/uL (ref 0.00–0.07)
Basophils Absolute: 0 10*3/uL (ref 0.0–0.1)
Basophils Relative: 1 %
Eosinophils Absolute: 0.1 10*3/uL (ref 0.0–0.5)
Eosinophils Relative: 2 %
HCT: 25 % — ABNORMAL LOW (ref 36.0–46.0)
Hemoglobin: 7.7 g/dL — ABNORMAL LOW (ref 12.0–15.0)
Immature Granulocytes: 0 %
Lymphocytes Relative: 17 %
Lymphs Abs: 0.8 10*3/uL (ref 0.7–4.0)
MCH: 29.4 pg (ref 26.0–34.0)
MCHC: 30.8 g/dL (ref 30.0–36.0)
MCV: 95.4 fL (ref 80.0–100.0)
Monocytes Absolute: 0.5 10*3/uL (ref 0.1–1.0)
Monocytes Relative: 11 %
Neutro Abs: 3.1 10*3/uL (ref 1.7–7.7)
Neutrophils Relative %: 69 %
Platelets: 90 10*3/uL — ABNORMAL LOW (ref 150–400)
RBC: 2.62 MIL/uL — ABNORMAL LOW (ref 3.87–5.11)
RDW: 18.5 % — ABNORMAL HIGH (ref 11.5–15.5)
WBC: 4.5 10*3/uL (ref 4.0–10.5)
nRBC: 0 % (ref 0.0–0.2)

## 2020-01-26 LAB — RENAL FUNCTION PANEL
Albumin: 2.4 g/dL — ABNORMAL LOW (ref 3.5–5.0)
Anion gap: 10 (ref 5–15)
BUN: 67 mg/dL — ABNORMAL HIGH (ref 8–23)
CO2: 30 mmol/L (ref 22–32)
Calcium: 8.7 mg/dL — ABNORMAL LOW (ref 8.9–10.3)
Chloride: 94 mmol/L — ABNORMAL LOW (ref 98–111)
Creatinine, Ser: 2.53 mg/dL — ABNORMAL HIGH (ref 0.44–1.00)
GFR calc Af Amer: 21 mL/min — ABNORMAL LOW (ref >60)
GFR calc non Af Amer: 18 mL/min — ABNORMAL LOW (ref >60)
Glucose, Bld: 127 mg/dL — ABNORMAL HIGH (ref 70–99)
Phosphorus: 5.4 mg/dL — ABNORMAL HIGH (ref 2.5–4.6)
Potassium: 4.1 mmol/L (ref 3.5–5.1)
Sodium: 134 mmol/L — ABNORMAL LOW (ref 135–145)

## 2020-01-26 LAB — MAGNESIUM: Magnesium: 2.6 mg/dL — ABNORMAL HIGH (ref 1.7–2.4)

## 2020-01-26 LAB — GLUCOSE, CAPILLARY
Glucose-Capillary: 112 mg/dL — ABNORMAL HIGH (ref 70–99)
Glucose-Capillary: 115 mg/dL — ABNORMAL HIGH (ref 70–99)
Glucose-Capillary: 117 mg/dL — ABNORMAL HIGH (ref 70–99)
Glucose-Capillary: 125 mg/dL — ABNORMAL HIGH (ref 70–99)
Glucose-Capillary: 127 mg/dL — ABNORMAL HIGH (ref 70–99)
Glucose-Capillary: 128 mg/dL — ABNORMAL HIGH (ref 70–99)
Glucose-Capillary: 136 mg/dL — ABNORMAL HIGH (ref 70–99)

## 2020-01-26 MED ORDER — PAROXETINE HCL 20 MG PO TABS: 60.0000 mg | ORAL_TABLET | Freq: Every day | ORAL | Status: DC

## 2020-01-26 MED ORDER — DARBEPOETIN ALFA 100 MCG/0.5ML IJ SOSY
100.0000 ug | PREFILLED_SYRINGE | INTRAMUSCULAR | Status: DC
  Filled 2020-01-26: qty 0.5

## 2020-01-26 NOTE — Progress Notes (Signed)
PT Cancellation Note  Patient Details Name: Bailey Cox MRN: [id number] DOB: 02-05-1946   Cancelled Treatment:    Reason Eval/Treat Not Completed: Patient declined, no reason specified. Upon arrival pt resting in bed and refusing to participate with therapy. Pt stating she just doesn't want to even after education on benefits and importance of therapy. Upon chart review, pt has refused several times in a row and on eval very little active participation. PT will sign off at this time due to pt not participating with therapy. PT will re-evaluate pt when pt ready and able to actively participate with new PT orders. Thank you.   Zachary George PT, DPT 10:09 AM,01/26/20   Monterio Bob Drucilla Chalet 01/26/2020, 10:07 AM

## 2020-01-26 NOTE — Progress Notes (Signed)
PROGRESS NOTE    Bailey Cox  XFG:[id number] DOB: [birth year] DOA: 01/05/2020 PCP: Patient, No Pcp Per   Brief Narrative:  The patient is a 74 year old obese Caucasian female who was admitted on 01/05/2020 from [facility] where she had just arrived after a long admission to [facility] where she was hospitalized from 10/2019 up until 01/05/2020 where she suffered a PEA arrest during endoscopy and ended up requiring a tracheostomy.  She presented to [facility] with widespread peripheral edema and acidosis.    Originally she was hospitalized in September 2020 with acute cholecystitis and cholecystectomy.  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  While hospitalized she developed fluid overload and ultimately transferred to [facility] for dialysis.  Her clinical course after that point became somewhat unclear but family described that she was on a ventilator for several procedures and was eventually left intubated.  1 of those procedures and endoscopy which she unfortunately suffered a cardiac arrest.  Again details of the cardiac arrest or not clear but she remained on the ventilator and ultimately underwent a tracheostomy.  At that time her hospital course was also complicated by MRSA pneumonia.  She is ultimately able to come off of hemodialysis and treated with intermittent diuresis.  During her hospital stay she bounced in and out of the ICU for a few times with volume overload and at the end of her admission at [facility] she still struggled with kidney disease and was being treated for urinary tract infection with vancomycin and cefepime.  Plans were stopped this on 3 2.  She was discharged on the [facility] system on [facility] in [place] upon arrival to [facility] they deemed her too sick for admission transferred her directly to [facility] emergency department with complaints of volume overload and acidosis.   Since then she has been in ICU and has been off of the ventilator and now on trach collar.  She underwent CRRT from 3 9 until 312 and transferred to the floor on 3/14 after she was tolerating hemodialysis.  Currently nephrology has been consulted and they feel that she has an AKI on CKD most likely cause of her AKI being from ATN from sepsis and family still wants aggressive measures so she was on CRRT from 3 7 until 311 for clearance and also for anasarca and volume management.  Her CRRT was stopped on 311 and Dr. Moshe Cipro suspected that she will likely require renal replacement therapy in the setting of intermittent hemodialysis.  They feel that if she has a trach and is dialysis dependent the only option would be send her to LTAC.  The plan is for intermittent hemodialysis schedule on Tuesday Thursdays and Saturdays here via [facility].  Placement is likely going to be an issue because of this.    PT OT recommending SNF at this time.  Patient with no days left for LTAC.   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 Tracheostomy placed 11/25/2018.  Per PCCM they discussed with family  who was under the impression patient be decannulated when ready however due to transfer of care from [facility] patient was lost to follow-up. patient admitted for anasarca and acute respiratory failure had to be placed on the vent as well as dialyzed with some clinical improvement and placed back on trach collar.  Patient with clear lung sounds.  Was initially rhonchorous however has improved.  Status post IV antibiotics of cefepime and vancomycin.  Patient  off ventilator since 01/12/2020.  Was on Mucinex twice daily which has subsequently been discontinued. Speech therapy consulted for use of Passy-Muir valve.  Chest PT. per pulmonary patient tolerated capping 3/18-3/19. Patient s/p decannulation. Per pulmonary.    2.  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  Pulmonary following and feel patient is not a great candidate for long-term dialysis due to trach.  Dr. Augustin Coupe had spoken with family and they wanted RRT.  Patient was on dialysis off and on while at [facility] and due to get dialysis while at [place].  Patient noted to have intermittent HD on 01/09/2020.  Patient started on CRRT on 372 01/15/2020 for clearance and also for anasarca/volume management.  CRRT was stopped on 01/15/2020.  Nephrology following and planning for intermittent HD to keep on Tuesday Thursday Saturday schedule while in-house versus TDC.  Patient underwent hemodialysis on 01/22/2020, 01/24/2020 per nephrology.  3.  Well-controlled diabetes type 2 CBG of 127 this morning.  Hemoglobin A1c 5.7.  Sliding scale insulin.  4.  Anemia of critical illness/anemia of chronic disease/thrombocytopenia Patient with no overt bleeding.  Hemoglobin currently at 7.7.  Anemia panel with iron of 81, ferritin of 886.  Patient receiving Aranesp per nephrology.  Follow H&H.  Transfusion threshold hemoglobin < 7 which would likely need to be done in hemodialysis if needed.  5.  Hyponatremia Improved with hemodialysis.  Follow.  6.  Anxiety Continue current regimen of half home dose Paxil, alprazolam 3 times daily as needed, trazodone nightly.    7.  Gastroesophageal reflux disease/ Continue PPI.    8.  Left upper extremity swelling In the setting of anasarca.  Improved.  Upper extremity Dopplers negative for DVT.    9.  Obesity  10.  Nutrition Patient started on Nephro-Vite 40 cc/h via PEG.  Continue 30 cc prostat 3 times daily.   Dietitian following.    DVT prophylaxis: Heparin Code Status: Full Family Communication: Updated patient.  Disposition Plan:   Patient came from: [facility]             Anticipated d/c place: SNF  Barriers to d/c OR conditions which need to be met to effect a safe d/c: Patient was in the ICU for 14 days status post tracheostomy required intermittent dialysis.  Patient has been refused for [facility] as well as select specialty LTAC.  Likely to skilled nursing facility now patient has been decannulated and when able to sit up for dialysis.  Social work consulted.    Consultants:   Nephrology: Dr. Augustin Coupe 01/06/2020  Wound care  Entered interventional radiology  Procedures:  PICC pre admit > Trach pre admit > 3/5 IJ vas cath-> Left upper extremity Doppler negative for DVT 01/19/2020  Significant Diagnostic Tests:  Echo 10/24/2019>LVEF 55%, Grade 1 DD. RV systolic function normal.  Echo 3/2: LVEF 65-70%, grade II dysfunction. RVSP   Antimicrobials:  Meropenem 2/21 >>off  Cefepime Pre hosp (for tracheitis ) >>3/1->3/7 Vancomycin pre hosp >>3/2  IV  vancomycin 01/10/2020>>>> 01/13/2020    Micro Data:  Blood 3/1 >> ng Urine 3/1 >> ng resp 3/4 >>pan-sensitivepseudomonas    Echo 10/24/2019>LVEF 55%, Grade 1 DD. RV systolic function normal.  Echo 3/2: LVEF 65-70%, grade II dysfunction. RVSP     Subjective: Patient patient sleeping but arousable.  Denies chest pain or shortness of breath.  States she is willing to work with physical therapy later on today.   Objective: Vitals:   01/25/20 0406 01/25/20 1347 01/25/20 2014 01/26/20 0444  BP: (!) 152/60 (!) 159/71 (!) 161/87 (!) 148/57  Pulse: 82 75 74 75  Resp:  _0 Temp: 97.6 F (36.4 C) 97.8 F (36.6 C) 98.2 F (36.8 C) 98.1 F (36.7 C)  TempSrc: Oral Axillary  Oral  SpO2: 99% 98% 100% 100%  Weight:      Height:        Intake/Output Summary (Last 24 hours) at 01/26/2020 1155 Last data filed  at 01/26/2020 1101 Gross per 24 hour  Intake 0 ml  Output --  Net 0 ml   Filed Weights   01/22/20 1045 01/24/20 0612 01/24/20 1530  Weight: 96.9 kg 91 kg 91.4 kg    Examination:  General exam: NAD Respiratory system: CTAB.  No wheezes, no crackles, no rhonchi.  Trach decannulated.   Cardiovascular system: RRR no murmurs rubs or gallops.  No JVD.  No lower extremity edema.  Gastrointestinal system: Abdomen is nontender, nondistended, soft, positive bowel sounds.  No rebound.  No guarding.  G-tube intact. Central nervous system: Alert and oriented. No focal neurological deficits. Extremities: Symmetric 5 x 5 power. Skin: No rashes, lesions or ulcers Psychiatry: Judgement and insight appear normal. Mood & affect appropriate.     Data Reviewed: I have personally reviewed following labs and imaging studies  CBC: Recent Labs  Lab 01/20/20 0356 01/20/20 0356 01/22/20 1543 01/23/20 0247 01/24/20 0441 01/25/20 0354 01/26/20 0436  WBC 4.0  --  3.6* 3.6* 4.2  --  4.5  NEUTROABS 2.8  --  2.6  --   --   --  3.1  HGB 7.6*   < > 7.7* 7.6* 7.4* 7.5* 7.7*  HCT 25.2*   < > 25.8* 25.1* 25.0* 25.0* 25.0*  MCV 97.7  --  96.6 98.8 98.0  --  95.4  PLT 73*  --  68* 71* 71*  --  90*   < > = values in this interval not displayed.   Basic Metabolic Panel: Recent Labs  Lab 01/22/20 0339 01/22/20 1543 01/23/20 0247 01/23/20 1604 01/24/20 0441 01/24/20 1950 01/25/20 0354 01/25/20 1515 01/26/20 0436  NA 135   < > 134*   < > 134* 133* 136 134* 134*  K 4.3   < > 4.0   < > 4.3 3.6 3.7 4.2 4.1  CL 96*   < > 94*   < > 94* 95* 97* 94* 94*  CO2 30   < > 30   < > _1 GLUCOSE 105*   < > 113*   < > 122* 126* 122* 135* 127*  BUN 88*   < > 47*   < > 72* 33* 41* 53* 67*  CREATININE 2.29*   < > 1.63*   < > 2.25* 1.34* 1.66* 1.98* 2.53*  CALCIUM 8.4*   < > 8.4*   < > 8.5* 8.0* 8.4* 8.6* 8.7*  MG 2.5*  --  2.4  --  2.5*  --  2.3  --  2.6*  PHOS 6.6*   < > 4.8*   < > 6.1* 3.0 3.4 4.7*  5.4*   < > = values in this interval not displayed.   GFR: Estimated Creatinine Clearance: 22.3 mL/min (A) (by C-G formula based on SCr of 2.53 mg/dL (H)). Liver Function Tests: Recent Labs  Lab 01/20/20 0356 01/20/20 1846 01/21/20 0421 01/21/20 1918 01/24/20 0441 01/24/20 1950 01/25/20 0354 01/25/20 1515 01/26/20 0436  AST 29  --  27  --   --   --   --   --   --   ALT 23  --  21  --   --   --   --   --   --   ALKPHOS 198*  --  189*  --   --   --   --   --   --   BILITOT 0.5  --  0.2*  --   --   --   --   --   --   PROT 5.9*  --  5.7*  --   --   --   --   --   --   ALBUMIN 2.4*   2.4*   < > 2.2*   2.3*   < > 2.4* 2.5* 2.4* 2.5* 2.4*   < > = values in this interval not displayed.   No results for input(s): LIPASE, AMYLASE in the last 168 hours. No results for input(s): AMMONIA in the last 168 hours. Coagulation Profile: No results for input(s): INR, PROTIME in the last 168 hours. Cardiac Enzymes: No results for input(s): CKTOTAL, CKMB, CKMBINDEX, TROPONINI in the last 168 hours. BNP (last 3 results) No results for input(s): PROBNP in the last 8760 hours. HbA1C: No results for input(s): HGBA1C in the last 72 hours. CBG: Recent Labs  Lab 01/25/20 2001 01/26/20 0016 01/26/20 0049 01/26/20 0439 01/26/20 0758  GLUCAP 130* 112* 115* 127* 136*   Lipid Profile: No results for input(s): CHOL, HDL, LDLCALC, TRIG, CHOLHDL, LDLDIRECT in the last 72 hours. Thyroid Function Tests: No results for input(s): TSH, T4TOTAL, FREET4, T3FREE, THYROIDAB in the last 72 hours. Anemia Panel: No results for input(s): VITAMINB12, FOLATE, FERRITIN, TIBC, IRON, RETICCTPCT in the last 72 hours. Sepsis Labs: No results for input(s): PROCALCITON, LATICACIDVEN in the last 168 hours.  No results found for this or any previous visit (from the past 240 hour(s)).       Radiology Studies: No results found.      Scheduled Meds:  B-complex with vitamin C  1 tablet Per Tube Daily    chlorhexidine gluconate (MEDLINE KIT)  15 mL Mouth Rinse BID   Chlorhexidine Gluconate Cloth  6 each Topical Daily   Chlorhexidine Gluconate Cloth  6 each Topical Q0600   [START ON 01/27/2020] darbepoetin (ARANESP) injection - DIALYSIS  100 mcg Intravenous Q Tue-HD   feeding supplement (NEPRO CARB STEADY)  1,000 mL Oral Q24H   feeding supplement (PRO-STAT SUGAR FREE 64)  30 mL Per Tube TID   guaiFENesin  10 mL Per Tube BID   heparin  5,000 Units Subcutaneous Q8H   insulin aspart  0-15 Units Subcutaneous Q4H   mouth rinse  15 mL Mouth Rinse 10 times per day   pantoprazole sodium  40 mg Per Tube Q24H   PARoxetine  30 mg Per Tube Daily   sodium chloride flush  10-40 mL Intracatheter Q12H   traZODone  25 mg Per Tube QHS   Continuous Infusions:  sodium chloride  LOS: 21 days    Time spent: 35 minutes.    Irine Seal, MD [facility]   To contact the attending provider between 7A-7P or the covering provider during after hours 7P-7A, please log into the web site [email or web address] and access using universal [place] password for that web site. If you do not have the password, please call the hospital operator.  01/26/2020, 11:55 AM

## 2020-01-26 NOTE — Progress Notes (Addendum)
Patient refusing nursing care, offered mouthcare, fluids and incentive spirometer.

## 2020-01-26 NOTE — Progress Notes (Signed)
  Speech Language Pathology Treatment: Dysphagia  Patient Details Name: Bailey Cox MRN: [id number] DOB: [birth year] Today's Date: 01/26/2020 Time: 5093-2671 SLP Time Calculation (min) (ACUTE ONLY): 8 min  Assessment / Plan / Recommendation Clinical Impression  Pt has no interest in rehabilitation or participation in any kind activity, immediately responds "no" to any question. When started to sit up pt became more adamant in refusal. Finally accepted a sip of sprite, but was laying back and did have some coughing. Pt is just not participating enough to determine safety with PO. She hasn't taken any PO that RN is aware of. When told that RN would offer her sips of sprite today she said "no!" Suggest that visiting family try to motivate pt with PO. Will f/u once a week to make occasional attempts with PO trials.   HPI HPI: 74 y.o. female who had a prolonged 36 day hospitalization at [facility] with a history of HTN COPD DM hypothyroidism Ogilvie's syndrome. PEA arrest during EGD in early December with duodenal ulcer noted on EGD. Intermittent hyperkalemia treatet medically w/ Kayexalate and now recently w/ worsening renal function over the past few days prior to admission to [facility].  during admission at [facility] pt weaned off the vent and eventually decannulated. According to her daughter, she has not had anything to eat or drink since November due to primary problem Olglivie's Syndrome, but had no difficulty with swallowing prior.      SLP Plan  Continue with current plan of care       Recommendations  Diet recommendations: Other(comment)(sips, bites of jello) Medication Administration: Via alternative means Compensations: Slow rate;Small sips/bites                Follow up Recommendations: Skilled Nursing facility SLP Visit Diagnosis: Dysphagia, unspecified (R13.10) Plan: Continue with current plan of care       GO                Kyan Yurkovich, Katherene Ponto 01/26/2020, 1:45  PM

## 2020-01-26 NOTE — Progress Notes (Signed)
[facility] Progress Note    74 y.o.femalewho had a prolonged 81 day hospitalization at [facility] with  HTN COPD DM hypothyroidism Ogilvie's syndrome. PEA arrest during EGD in early December.Intermittent hyperkalemia treatet medically and with HD at OSH and now recently w/ worsening renal function over the past few days prior to admission to [facility]. She actually had a foley placed 12/28/19 when the renal function was worsening with return of purulence - started on Meropenem. Per review of chart the pt did not want RRT but after urging by family agreed to it.   Assessment/ Plan:   1. AKI on CKD with most likely cause of the AKI being ATN from sepsis. Started CRRT on 3/7-3/11 for clearance and also for the anasarca/ volume management-   stopped CRRT on 3/11.  Suspect will need ongoing RRT in the form of IHD.  Planning for IHD to keep on TTS  schedule while here via [place].  Next HD 3/23.  Getting 3-3.5L UF per rx.  Not on midodrine.    2. Sepsis -  Resolved, off all antibiotics, suspected urosepsis  3. H/o PEA arrest  4. H/o MRSA PNA  5. Respiratory failure with  Trach-->decannulated 3/19 and is doing well  6. Htn.vol- vol status getting better--> still has overload, UF as tolerated  7. Anemia-   Aranesp 100 mcg q Monday.  Iron OK when checked 3/9.  8. Dispo: pending.  Looks like she is dialysis dependent for now, since decannulated has many more options for dispo.   Subjective:    No complaints, sleeping and arousable.    Objective:   BP (!) 148/57 (BP Location: Left Leg)   Pulse 75   Temp 98.1 F (36.7 C) (Oral)   Resp 16   Ht _0  (1.626 m)   Wt 91.4 kg   SpO2 100%   BMI 34.59 kg/m   Intake/Output Summary (Last 24 hours) at 01/26/2020 1319 Last data filed at 01/26/2020 1101 Gross per 24 hour  Intake 0 ml  Output --  Net 0 ml   Weight change:   Physical Exam: GEN: NAD, awake and alert, shakes head to answer questions HEENT: EOMI NECK: Supple, no  thyromegaly + trach site dressed LUNGS: CTA anteriorly CV: RRR, No M/R/G ABD: NA EXT: dependent and flank edema ACCESS: RIJ TDC  Imaging: No results found.  Labs: BMET Recent Labs  Lab 01/23/20 0247 01/23/20 1604 01/24/20 0441 01/24/20 1950 01/25/20 0354 01/25/20 1515 01/26/20 0436  NA 134* 133* 134* 133* 136 134* 134*  K 4.0 4.3 4.3 3.6 3.7 4.2 4.1  CL 94* 95* 94* 95* 97* 94* 94*  CO2 _1 GLUCOSE 113* 154* 122* 126* 122* 135* 127*  BUN 47* 58* 72* 33* 41* 53* 67*  CREATININE 1.63* 1.99* 2.25* 1.34* 1.66* 1.98* 2.53*  CALCIUM 8.4* 8.5* 8.5* 8.0* 8.4* 8.6* 8.7*  PHOS 4.8* 5.7* 6.1* 3.0 3.4 4.7* 5.4*   CBC Recent Labs  Lab 01/20/20 0356 01/20/20 0356 01/22/20 1543 01/22/20 1543 01/23/20 0247 01/24/20 0441 01/25/20 0354 01/26/20 0436  WBC 4.0   < > 3.6*  --  3.6* 4.2  --  4.5  NEUTROABS 2.8  --  2.6  --   --   --   --  3.1  HGB 7.6*   < > 7.7*   < > 7.6* 7.4* 7.5* 7.7*  HCT 25.2*   < > 25.8*   < > 25.1* 25.0* 25.0* 25.0*  MCV 97.7   < > 96.6  --  98.8 98.0  --  95.4  PLT 73*   < > 68*  --  71* 71*  --  90*   < > = values in this interval not displayed.    Medications:    . B-complex with vitamin C  1 tablet Per Tube Daily  . chlorhexidine gluconate (MEDLINE KIT)  15 mL Mouth Rinse BID  . Chlorhexidine Gluconate Cloth  6 each Topical Daily  . Chlorhexidine Gluconate Cloth  6 each Topical Q0600  . [START ON 01/27/2020] darbepoetin (ARANESP) injection - DIALYSIS  100 mcg Intravenous Q Tue-HD  . feeding supplement (NEPRO CARB STEADY)  1,000 mL Oral Q24H  . feeding supplement (PRO-STAT SUGAR FREE 64)  30 mL Per Tube TID  . guaiFENesin  10 mL Per Tube BID  . heparin  5,000 Units Subcutaneous Q8H  . insulin aspart  0-15 Units Subcutaneous Q4H  . mouth rinse  15 mL Mouth Rinse 10 times per day  . pantoprazole sodium  40 mg Per Tube Q24H  . PARoxetine  30 mg Per Tube Daily  . sodium chloride flush  10-40 mL Intracatheter Q12H  . traZODone  25 mg  Per Tube QHS      Justin Mend  01/26/2020, 1:19 PM

## 2020-01-27 DIAGNOSIS — N179 Acute kidney failure, unspecified: Secondary | ICD-10-CM | POA: Diagnosis not present

## 2020-01-27 DIAGNOSIS — J9601 Acute respiratory failure with hypoxia: Secondary | ICD-10-CM | POA: Diagnosis not present

## 2020-01-27 DIAGNOSIS — J9611 Chronic respiratory failure with hypoxia: Secondary | ICD-10-CM | POA: Diagnosis not present

## 2020-01-27 DIAGNOSIS — Z930 Tracheostomy status: Secondary | ICD-10-CM | POA: Diagnosis not present

## 2020-01-27 LAB — RENAL FUNCTION PANEL
Albumin: 2.5 g/dL — ABNORMAL LOW (ref 3.5–5.0)
Anion gap: 10 (ref 5–15)
BUN: 97 mg/dL — ABNORMAL HIGH (ref 8–23)
CO2: 28 mmol/L (ref 22–32)
Calcium: 8.5 mg/dL — ABNORMAL LOW (ref 8.9–10.3)
Chloride: 95 mmol/L — ABNORMAL LOW (ref 98–111)
Creatinine, Ser: 2.87 mg/dL — ABNORMAL HIGH (ref 0.44–1.00)
GFR calc Af Amer: 18 mL/min — ABNORMAL LOW (ref >60)
GFR calc non Af Amer: 16 mL/min — ABNORMAL LOW (ref >60)
Glucose, Bld: 116 mg/dL — ABNORMAL HIGH (ref 70–99)
Phosphorus: 6 mg/dL — ABNORMAL HIGH (ref 2.5–4.6)
Potassium: 4.3 mmol/L (ref 3.5–5.1)
Sodium: 133 mmol/L — ABNORMAL LOW (ref 135–145)

## 2020-01-27 LAB — CBC
HCT: 25.4 % — ABNORMAL LOW (ref 36.0–46.0)
Hemoglobin: 7.7 g/dL — ABNORMAL LOW (ref 12.0–15.0)
MCH: 29.5 pg (ref 26.0–34.0)
MCHC: 30.3 g/dL (ref 30.0–36.0)
MCV: 97.3 fL (ref 80.0–100.0)
Platelets: 106 10*3/uL — ABNORMAL LOW (ref 150–400)
RBC: 2.61 MIL/uL — ABNORMAL LOW (ref 3.87–5.11)
RDW: 18.2 % — ABNORMAL HIGH (ref 11.5–15.5)
WBC: 4.7 10*3/uL (ref 4.0–10.5)
nRBC: 0 % (ref 0.0–0.2)

## 2020-01-27 LAB — GLUCOSE, CAPILLARY
Glucose-Capillary: 109 mg/dL — ABNORMAL HIGH (ref 70–99)
Glucose-Capillary: 112 mg/dL — ABNORMAL HIGH (ref 70–99)
Glucose-Capillary: 131 mg/dL — ABNORMAL HIGH (ref 70–99)
Glucose-Capillary: 132 mg/dL — ABNORMAL HIGH (ref 70–99)
Glucose-Capillary: 136 mg/dL — ABNORMAL HIGH (ref 70–99)

## 2020-01-27 LAB — MAGNESIUM: Magnesium: 2.8 mg/dL — ABNORMAL HIGH (ref 1.7–2.4)

## 2020-01-27 LAB — HEMOGLOBIN AND HEMATOCRIT, BLOOD
HCT: 24.1 % — ABNORMAL LOW (ref 36.0–46.0)
Hemoglobin: 7.4 g/dL — ABNORMAL LOW (ref 12.0–15.0)

## 2020-01-27 MED ORDER — HEPARIN SODIUM (PORCINE) 1000 UNIT/ML IJ SOLN
INTRAMUSCULAR | Status: AC
  Administered 2020-01-27: 3800 [IU]
  Filled 2020-01-27: qty 4

## 2020-01-27 MED ORDER — DARBEPOETIN ALFA 100 MCG/0.5ML IJ SOSY
PREFILLED_SYRINGE | INTRAMUSCULAR | Status: AC
  Administered 2020-01-27: 100 ug via INTRAVENOUS
  Filled 2020-01-27: qty 0.5

## 2020-01-27 MED ORDER — HEPARIN SODIUM (PORCINE) 1000 UNIT/ML IJ SOLN
INTRAMUSCULAR | Status: AC
  Administered 2020-01-27: 1700 [IU] via INTRAVENOUS
  Filled 2020-01-27: qty 2

## 2020-01-27 MED ORDER — HEPARIN SODIUM (PORCINE) 1000 UNIT/ML IJ SOLN: 1700.0000 [IU] | Freq: Once | INTRAMUSCULAR | Status: AC

## 2020-01-27 MED ORDER — PAROXETINE HCL 20 MG PO TABS
40.0000 mg | ORAL_TABLET | Freq: Every day | ORAL | Status: DC
  Administered 2020-01-27 – 2020-03-21 (×55): 40 mg
  Filled 2020-01-27 (×55): qty 2

## 2020-01-27 MED ORDER — BUSPIRONE HCL 5 MG PO TABS
5.0000 mg | ORAL_TABLET | Freq: Two times a day (BID) | ORAL | Status: DC
  Administered 2020-01-27 – 2020-02-05 (×19): 5 mg via ORAL
  Filled 2020-01-27 (×19): qty 1

## 2020-01-27 NOTE — Progress Notes (Signed)
PROGRESS NOTE    Bailey Cox  QQI:[id number] DOB: [birth year] DOA: 01/05/2020 PCP: Patient, No Pcp Per   Brief Narrative:  The patient is a 74 year old obese Caucasian female who was admitted on 01/05/2020 from [facility] where she had just arrived after a long admission to [facility] where she was hospitalized from 10/2019 up until 01/05/2020 where she suffered a PEA arrest during endoscopy and ended up requiring a tracheostomy.  She presented to [facility] with widespread peripheral edema and acidosis.    Originally she was hospitalized in September 2020 with acute cholecystitis and cholecystectomy.  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  While hospitalized she developed fluid overload and ultimately transferred to [facility] for dialysis.  Her clinical course after that point became somewhat unclear but family described that she was on a ventilator for several procedures and was eventually left intubated.  1 of those procedures and endoscopy which she unfortunately suffered a cardiac arrest.  Again details of the cardiac arrest or not clear but she remained on the ventilator and ultimately underwent a tracheostomy.  At that time her hospital course was also complicated by MRSA pneumonia.  She is ultimately able to come off of hemodialysis and treated with intermittent diuresis.  During her hospital stay she bounced in and out of the ICU for a few times with volume overload and at the end of her admission at [facility] she still struggled with kidney disease and was being treated for urinary tract infection with vancomycin and cefepime.  Plans were stopped this on 3 2.  She was discharged on the [facility] system on [facility] in [place] upon arrival to [facility] they deemed her too sick for admission transferred her directly to [facility] emergency department with complaints of volume overload and acidosis.   Since then she has been in ICU and has been off of the ventilator and now on trach collar.  She underwent CRRT from 3 9 until 312 and transferred to the floor on 3/14 after she was tolerating hemodialysis.  Currently nephrology has been consulted and they feel that she has an AKI on CKD most likely cause of her AKI being from ATN from sepsis and family still wants aggressive measures so she was on CRRT from 3 7 until 311 for clearance and also for anasarca and volume management.  Her CRRT was stopped on 311 and Dr. Moshe Cipro suspected that she will likely require renal replacement therapy in the setting of intermittent hemodialysis.  They feel that if she has a trach and is dialysis dependent the only option would be send her to LTAC.  The plan is for intermittent hemodialysis schedule on Tuesday Thursdays and Saturdays here via [facility].  Placement is likely going to be an issue because of this.    PT OT recommending SNF at this time.  Patient with no days left for LTAC.   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 Tracheostomy placed 11/25/2018.  Per PCCM they discussed with family  who was under the impression patient be decannulated when ready however due to transfer of care from [facility] patient was lost to follow-up. patient admitted for anasarca and acute respiratory failure had to be placed on the vent as well as dialyzed with some clinical improvement and placed back on trach collar.  Patient with clear lung sounds.  Was initially rhonchorous however has improved.  Status post IV antibiotics of cefepime and vancomycin.  Patient  off ventilator since 01/12/2020.  Was on Mucinex twice daily which has subsequently been discontinued. Speech therapy consulted for use of Passy-Muir valve.  Chest PT. per pulmonary patient tolerated capping 3/18-3/19. Patient s/p decannulation which she seems to be tolerating well.  Patient has been followed by speech therapy who are recommending sips, bites of Jell-O at this time.  Per pulmonary.    2.  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  Pulmonary following and feel patient is not a great candidate for long-term dialysis due to trach.  Dr. Augustin Coupe had spoken with family and they wanted RRT.  Patient was on dialysis off and on while at [facility] and due to get dialysis while at [place].  Patient noted to have intermittent HD on 01/09/2020.  Patient started on CRRT on 372 01/15/2020 for clearance and also for anasarca/volume management.  CRRT was stopped on 01/15/2020.  Nephrology following and planning for intermittent HD to keep on Tuesday Thursday Saturday schedule while in-house versus TDC.  Patient underwent hemodialysis on 01/22/2020, 01/24/2020 today 01/27/2020.  Per nephrology patient looks like she is dialysis dependent now.  Patient likely to sit in recliner for next hemodialysis session in preparation to set patient up for outpatient hemodialysis.  Per nephrology.  3.  Well-controlled diabetes type 2 CBG of 109 this morning.  Hemoglobin A1c 5.7.  Sliding scale insulin.  4.  Anemia of critical illness/anemia of chronic disease/thrombocytopenia Patient with no overt bleeding.  Hemoglobin currently at 7.7.  Anemia panel with iron of 81, ferritin of 886.  Patient receiving Aranesp per nephrology.  Follow H&H.  Transfusion threshold hemoglobin < 7.  5.  Hyponatremia Improved with hemodialysis.  Follow.  6.  Anxiety Increase Paxil back to home regimen of 40 mg daily asked hours per RN what family states patient was on.  Continue  alprazolam 3 times daily as needed as well as trazodone at bedtime.  Per family patient noted to be on BuSpar 10 mg twice daily however in communication with pharmacy it was noted that patient did state was not taking BuSpar anymore.  BuSpar started at 5 mg twice daily.  Follow.   7.  Gastroesophageal reflux disease/ Continue PPI.    8.  Left upper extremity swelling Improved.  Upper extremities Dopplers negative for DVT.   9.  Obesity  10.  Nutrition Continue Nephro-Vite 40 cc/h via PEG and 30 cc prostat 3 times daily.  Dietitian following.    DVT prophylaxis: Heparin Code Status: Full Family Communication: Updated patient.  Disposition Plan:  . Patient came from: [facility]            . Anticipated d/c place: SNF . Barriers to d/c OR conditions which need to be met to effect a safe d/c: Patient was in the ICU for 14 days status post tracheostomy required intermittent dialysis.  Patient has been refused for [facility] as well as select specialty LTAC.  Likely to skilled nursing facility now patient has been decannulated and  when able to sit up for dialysis.  Social work consulted.    Consultants:   Nephrology: Dr. Augustin Coupe 01/06/2020  Wound care  Entered interventional radiology  Procedures:  PICC pre admit > Trach pre admit > 3/5 IJ vas cath-> Left upper extremity Doppler negative for DVT 01/19/2020  Significant Diagnostic Tests:  Echo 10/24/2019>LVEF 55%, Grade 1 DD. RV systolic function normal.  Echo 3/2: LVEF 65-70%, grade II dysfunction. RVSP   Antimicrobials:  Meropenem 2/21 >>off  Cefepime Pre hosp (for tracheitis ) >>3/1->3/7 Vancomycin pre hosp >>3/2  IV vancomycin 01/10/2020>>>> 01/13/2020    Micro Data:  Blood 3/1 >> ng Urine 3/1 >> ng resp 3/4 >>pan-sensitivepseudomonas    Echo 10/24/2019>LVEF 55%, Grade 1 DD. RV systolic function normal.  Echo 3/2: LVEF 65-70%, grade II dysfunction. RVSP     Subjective: Patient more alert today.   Sitting up in bed.  Just returning from hemodialysis.  Denies any chest pain or shortness of breath.  No abdominal pain.   Objective: Vitals:   01/27/20 1000 01/27/20 1030 01/27/20 1032 01/27/20 1123  BP: 119/63 108/61 121/66 (!) 145/60  Pulse: 67 65 67 72  Resp:    19  Temp:   97.6 F (36.4 C) 98 F (36.7 C)  TempSrc:   Oral Axillary  SpO2:    100%  Weight:      Height:        Intake/Output Summary (Last 24 hours) at 01/27/2020 1304 Last data filed at 01/27/2020 1032 Gross per 24 hour  Intake 0 ml  Output 3526 ml  Net -3526 ml   Filed Weights   01/24/20 1530 01/27/20 0500 01/27/20 0655  Weight: 91.4 kg 91.1 kg 88.5 kg    Examination:  General exam: NAD Respiratory system: Lungs clear to auscultation bilaterally anterior lung fields.  No wheezes, no crackles, no rhonchi.  Trach decannulated.   Cardiovascular system: Regular rate rhythm no murmurs rubs or gallops.  No JVD.  No lower extremity edema.  Gastrointestinal system: Abdomen is soft, nontender, nondistended, positive bowel sounds.  No rebound.  No guarding.  G-tube intact.  Central nervous system: Alert and oriented. No focal neurological deficits. Extremities: Symmetric 5 x 5 power. Skin: No rashes, lesions or ulcers Psychiatry: Judgement and insight appear normal. Mood & affect appropriate.     Data Reviewed: I have personally reviewed following labs and imaging studies  CBC: Recent Labs  Lab 01/22/20 1543 01/22/20 1543 01/23/20 0247 01/23/20 0247 01/24/20 0441 01/25/20 0354 01/26/20 0436 01/27/20 0422 01/27/20 0719  WBC 3.6*  --  3.6*  --  4.2  --  4.5  --  4.7  NEUTROABS 2.6  --   --   --   --   --  3.1  --   --   HGB 7.7*   < > 7.6*   < > 7.4* 7.5* 7.7* 7.4* 7.7*  HCT 25.8*   < > 25.1*   < > 25.0* 25.0* 25.0* 24.1* 25.4*  MCV 96.6  --  98.8  --  98.0  --  95.4  --  97.3  PLT 68*  --  71*  --  71*  --  90*  --  106*   < > = values in this interval not displayed.   Basic Metabolic Panel: Recent  Labs  Lab 01/23/20 0247 01/23/20 1604 01/24/20 0441 01/24/20 0441 01/24/20 1950 01/25/20 0354 01/25/20 1515 01/26/20 0436 01/27/20 0422  NA 134*   < > 134*   < >  133* 136 134* 134* 133*  K 4.0   < > 4.3   < > 3.6 3.7 4.2 4.1 4.3  CL 94*   < > 94*   < > 95* 97* 94* 94* 95*  CO2 30   < > 28   < > '30 31 30 30 28  '$ GLUCOSE 113*   < > 122*   < > 126* 122* 135* 127* 116*  BUN 47*   < > 72*   < > 33* 41* 53* 67* 97*  CREATININE 1.63*   < > 2.25*   < > 1.34* 1.66* 1.98* 2.53* 2.87*  CALCIUM 8.4*   < > 8.5*   < > 8.0* 8.4* 8.6* 8.7* 8.5*  MG 2.4  --  2.5*  --   --  2.3  --  2.6* 2.8*  PHOS 4.8*   < > 6.1*   < > 3.0 3.4 4.7* 5.4* 6.0*   < > = values in this interval not displayed.   GFR: Estimated Creatinine Clearance: 19.4 mL/min (A) (by C-G formula based on SCr of 2.87 mg/dL (H)). Liver Function Tests: Recent Labs  Lab 01/21/20 0421 01/21/20 1918 01/24/20 1950 01/25/20 0354 01/25/20 1515 01/26/20 0436 01/27/20 0422  AST 27  --   --   --   --   --   --   ALT 21  --   --   --   --   --   --   ALKPHOS 189*  --   --   --   --   --   --   BILITOT 0.2*  --   --   --   --   --   --   PROT 5.7*  --   --   --   --   --   --   ALBUMIN 2.2*  2.3*   < > 2.5* 2.4* 2.5* 2.4* 2.5*   < > = values in this interval not displayed.   No results for input(s): LIPASE, AMYLASE in the last 168 hours. No results for input(s): AMMONIA in the last 168 hours. Coagulation Profile: No results for input(s): INR, PROTIME in the last 168 hours. Cardiac Enzymes: No results for input(s): CKTOTAL, CKMB, CKMBINDEX, TROPONINI in the last 168 hours. BNP (last 3 results) No results for input(s): PROBNP in the last 8760 hours. HbA1C: No results for input(s): HGBA1C in the last 72 hours. CBG: Recent Labs  Lab 01/26/20 1732 01/26/20 2023 01/27/20 0002 01/27/20 0406 01/27/20 1152  GLUCAP 117* 128* 132* 109* 112*   Lipid Profile: No results for input(s): CHOL, HDL, LDLCALC, TRIG, CHOLHDL, LDLDIRECT in the  last 72 hours. Thyroid Function Tests: No results for input(s): TSH, T4TOTAL, FREET4, T3FREE, THYROIDAB in the last 72 hours. Anemia Panel: No results for input(s): VITAMINB12, FOLATE, FERRITIN, TIBC, IRON, RETICCTPCT in the last 72 hours. Sepsis Labs: No results for input(s): PROCALCITON, LATICACIDVEN in the last 168 hours.  No results found for this or any previous visit (from the past 240 hour(s)).       Radiology Studies: No results found.      Scheduled Meds: . B-complex with vitamin C  1 tablet Per Tube Daily  . busPIRone  5 mg Oral BID  . chlorhexidine gluconate (MEDLINE KIT)  15 mL Mouth Rinse BID  . Chlorhexidine Gluconate Cloth  6 each Topical Daily  . Chlorhexidine Gluconate Cloth  6 each Topical Q0600  . darbepoetin (ARANESP) injection - DIALYSIS  100 mcg  Intravenous Q Tue-HD  . feeding supplement (NEPRO CARB STEADY)  1,000 mL Oral Q24H  . feeding supplement (PRO-STAT SUGAR FREE 64)  30 mL Per Tube TID  . guaiFENesin  10 mL Per Tube BID  . heparin  5,000 Units Subcutaneous Q8H  . insulin aspart  0-15 Units Subcutaneous Q4H  . mouth rinse  15 mL Mouth Rinse 10 times per day  . pantoprazole sodium  40 mg Per Tube Q24H  . PARoxetine  40 mg Per Tube Daily  . sodium chloride flush  10-40 mL Intracatheter Q12H  . traZODone  25 mg Per Tube QHS   Continuous Infusions: . sodium chloride       LOS: 22 days    Time spent: 35 minutes.    Irine Seal, MD [facility]   To contact the attending provider between 7A-7P or the covering provider during after hours 7P-7A, please log into the web site [email or web address] and access using universal [place] password for that web site. If you do not have the password, please call the hospital operator.  01/27/2020, 1:04 PM

## 2020-01-27 NOTE — TOC Progression Note (Signed)
Transition of Care [facility]) - Progression Note    Patient Details  Name: Bailey Cox MRN: [id number] Date of Birth: [birth year]  Transition of Care [facility]) CM/SW [place], [place] Phone Number: 01/27/2020, 11:07 AM  Clinical Narrative:    Spoke with renal navigator [place], pt in bed for dialysis today. We discussed next session trialing pt in recliner in anticipation for outpatient HD.    Expected Discharge Plan: [place] Barriers to Discharge: Continued Medical Work up, Other (comment), Requiring sitter/restraints(cuffed trach, HD)  Expected Discharge Plan and Services Expected Discharge Plan: [place] Discharge Planning Services: CM Consult Living arrangements for the past 2 months: Post-Acute Facility  Readmission Risk Interventions No flowsheet data found.

## 2020-01-27 NOTE — Progress Notes (Signed)
[facility] Progress Note    74 y.o.femalewho had a prolonged 81 day hospitalization at [facility] with  HTN COPD DM hypothyroidism Ogilvie's syndrome. PEA arrest during EGD in early December.Intermittent hyperkalemia treatet medically and with HD at OSH and now recently w/ worsening renal function over the past few days prior to admission to [facility]. She actually had a foley placed 12/28/19 when the renal function was worsening with return of purulence - started on Meropenem. Per review of chart the pt did not want RRT but after urging by family agreed to it.   Assessment/ Plan:   1. AKI on CKD with most likely cause of the AKI being ATN from sepsis. Started CRRT on 3/7-3/11 for clearance and also for the anasarca/ volume management-   stopped CRRT on 3/11.  Suspect will need ongoing RRT in the form of IHD.  Planning for IHD to keep on TTS  schedule while here via [place].  HD today.  Getting 3-3.5L UF per rx.  Not on midodrine.    2. Sepsis -  Resolved, off all antibiotics, suspected urosepsis  3. H/o PEA arrest  4. H/o MRSA PNA  5. Respiratory failure with  Trach-->decannulated 3/19 and is doing well  6. Htn.vol- vol status getting better--> still has overload, UF as tolerated  7. Anemia-   Aranesp 100 mcg q Tues - will increase if Hb not trending up out of 7s this week.  Iron OK when checked 3/9.  8. Dispo: pending.  Looks like she is dialysis dependent for now, since decannulated has many more options for dispo.   Subjective:    Seen at HD - tolerating well.  No new issues. Remains anuric.  Objective:   BP 128/66 (BP Location: Left Arm)   Pulse 68   Temp 97.9 F (36.6 C) (Oral)   Resp 17   Ht '5\' 4"'$  (1.626 m)   Wt 88.5 kg   SpO2 95%   BMI 33.49 kg/m   Intake/Output Summary (Last 24 hours) at 01/27/2020 1002 Last data filed at 01/26/2020 1638 Gross per 24 hour  Intake 0 ml  Output --  Net 0 ml   Weight change:   Physical Exam: GEN: NAD, awake and  alert, shakes head to answer questions HEENT: EOMI NECK: Supple, + trach site dressed LUNGS: CTA anteriorly CV: RRR, No M/R/G ABD: NA EXT: trace dependent and flank edema ACCESS: RIJ TDC  Imaging: No results found.  Labs: BMET Recent Labs  Lab 01/23/20 1604 01/24/20 0441 01/24/20 1950 01/25/20 0354 01/25/20 1515 01/26/20 0436 01/27/20 0422  NA 133* 134* 133* 136 134* 134* 133*  K 4.3 4.3 3.6 3.7 4.2 4.1 4.3  CL 95* 94* 95* 97* 94* 94* 95*  CO2 '29 28 30 31 30 30 28  '$ GLUCOSE 154* 122* 126* 122* 135* 127* 116*  BUN 58* 72* 33* 41* 53* 67* 97*  CREATININE 1.99* 2.25* 1.34* 1.66* 1.98* 2.53* 2.87*  CALCIUM 8.5* 8.5* 8.0* 8.4* 8.6* 8.7* 8.5*  PHOS 5.7* 6.1* 3.0 3.4 4.7* 5.4* 6.0*   CBC Recent Labs  Lab 01/22/20 1543 01/22/20 1543 01/23/20 0247 01/23/20 0247 01/24/20 0441 01/24/20 0441 01/25/20 0354 01/26/20 0436 01/27/20 0422 01/27/20 0719  WBC 3.6*   < > 3.6*  --  4.2  --   --  4.5  --  4.7  NEUTROABS 2.6  --   --   --   --   --   --  3.1  --   --  HGB 7.7*   < > 7.6*   < > 7.4*   < > 7.5* 7.7* 7.4* 7.7*  HCT 25.8*   < > 25.1*   < > 25.0*   < > 25.0* 25.0* 24.1* 25.4*  MCV 96.6   < > 98.8  --  98.0  --   --  95.4  --  97.3  PLT 68*   < > 71*  --  71*  --   --  90*  --  106*   < > = values in this interval not displayed.    Medications:    . B-complex with vitamin C  1 tablet Per Tube Daily  . busPIRone  5 mg Oral BID  . chlorhexidine gluconate (MEDLINE KIT)  15 mL Mouth Rinse BID  . Chlorhexidine Gluconate Cloth  6 each Topical Daily  . Chlorhexidine Gluconate Cloth  6 each Topical Q0600  . darbepoetin (ARANESP) injection - DIALYSIS  100 mcg Intravenous Q Tue-HD  . feeding supplement (NEPRO CARB STEADY)  1,000 mL Oral Q24H  . feeding supplement (PRO-STAT SUGAR FREE 64)  30 mL Per Tube TID  . guaiFENesin  10 mL Per Tube BID  . heparin  5,000 Units Subcutaneous Q8H  . insulin aspart  0-15 Units Subcutaneous Q4H  . mouth rinse  15 mL Mouth Rinse 10 times  per day  . pantoprazole sodium  40 mg Per Tube Q24H  . PARoxetine  40 mg Per Tube Daily  . sodium chloride flush  10-40 mL Intracatheter Q12H  . traZODone  25 mg Per Tube QHS      Justin Mend  01/27/2020, 10:02 AM

## 2020-01-27 NOTE — Progress Notes (Signed)
Back from dialysis.

## 2020-01-28 DIAGNOSIS — J9601 Acute respiratory failure with hypoxia: Secondary | ICD-10-CM | POA: Diagnosis not present

## 2020-01-28 LAB — RENAL FUNCTION PANEL
Albumin: 2.6 g/dL — ABNORMAL LOW (ref 3.5–5.0)
Anion gap: 9 (ref 5–15)
BUN: 54 mg/dL — ABNORMAL HIGH (ref 8–23)
CO2: 29 mmol/L (ref 22–32)
Calcium: 8.5 mg/dL — ABNORMAL LOW (ref 8.9–10.3)
Chloride: 96 mmol/L — ABNORMAL LOW (ref 98–111)
Creatinine, Ser: 1.86 mg/dL — ABNORMAL HIGH (ref 0.44–1.00)
GFR calc Af Amer: 31 mL/min — ABNORMAL LOW (ref >60)
GFR calc non Af Amer: 27 mL/min — ABNORMAL LOW (ref >60)
Glucose, Bld: 154 mg/dL — ABNORMAL HIGH (ref 70–99)
Phosphorus: 3.4 mg/dL (ref 2.5–4.6)
Potassium: 3.7 mmol/L (ref 3.5–5.1)
Sodium: 134 mmol/L — ABNORMAL LOW (ref 135–145)

## 2020-01-28 LAB — HEMOGLOBIN AND HEMATOCRIT, BLOOD
HCT: 26.9 % — ABNORMAL LOW (ref 36.0–46.0)
Hemoglobin: 8.3 g/dL — ABNORMAL LOW (ref 12.0–15.0)

## 2020-01-28 LAB — GLUCOSE, CAPILLARY
Glucose-Capillary: 122 mg/dL — ABNORMAL HIGH (ref 70–99)
Glucose-Capillary: 122 mg/dL — ABNORMAL HIGH (ref 70–99)
Glucose-Capillary: 124 mg/dL — ABNORMAL HIGH (ref 70–99)
Glucose-Capillary: 136 mg/dL — ABNORMAL HIGH (ref 70–99)
Glucose-Capillary: 150 mg/dL — ABNORMAL HIGH (ref 70–99)
Glucose-Capillary: 156 mg/dL — ABNORMAL HIGH (ref 70–99)

## 2020-01-28 LAB — MAGNESIUM: Magnesium: 2.4 mg/dL (ref 1.7–2.4)

## 2020-01-28 NOTE — Progress Notes (Signed)
InterDry changed as ordered. Pt tolerated well. Will continue to monitor.

## 2020-01-28 NOTE — Plan of Care (Signed)

## 2020-01-28 NOTE — Progress Notes (Signed)
Nutrition Follow-up  DOCUMENTATION CODES:   Morbid obesity  INTERVENTION:   Continue Nepro@ 42m/hr via PEG  367mProstat TID.   Tube feeding regimen provides2028kcal (100% of needs),123grams of protein, and 69842mf H2O.   NUTRITION DIAGNOSIS:   Inadequate oral intake related to inability to eat as evidenced by NPO status.  Ongoing  GOAL:   Patient will meet greater than or equal to 90% of their needs  Met with TF  MONITOR:   TF tolerance  REASON FOR ASSESSMENT:   Consult, Ventilator Enteral/tube feeding initiation and management  ASSESSMENT:   Pt with PMH of DM, CKD, GERD, HTN, vascular dementia, AKI on CKD, HF, and [facility] 01/05/20 with acute cholecystitis s/p cholecystectomy, bowel dilation secondary to Ogilvie's syndrome, fluid overload requiring short term HD, cardiac arrest, trach placement, MRSA pneumonia [facility] but was deemed too sick for admission and [facility] CRRTstarted 3/11 CRRT stopped 3/17- trach changed (#6 cuffless to #4 cuffless) 3/18- trach capped 3/19- decannulated  Reviewed I/O's: -3.2 L x 24 hours and -15.9 L since 01/14/20  Pt sitting up in bed at time of visit. She reports feeling ok today. She denies any vomiting or abdominal pain, however, complains of non-specific nausea.   Per SLP notes, pt reluctant to participate in swallow assessments. She has been refusing sips of water and jello. She remains dependent on TF: Nepro @ 40 ml/hr with 30 ml Prostat TID; regimen provides 2028 kcals, 123 grams protein, and 698 ml weater daily, meeting 100% of estimated kcal and protein needs.   Per nephrology notes, pt remains HD dependent. Plan to transition to HD in a chair tomorrow (01/29/20).   [facility] for [facility].   Labs reviewed: Na: 134, CBGS: 122-156 (inpatient orders for glycemic control are 0-15 units insulin aspart  every 4 hours).   Diet Order:   Diet Order            Diet NPO time specified Except for: Ice Chips, Other (See Comments)  Diet effective now              EDUCATION NEEDS:   No education needs have been identified at this time  Skin:  Skin Assessment: Reviewed RN Assessment(L groin: open wound/MASD)  Last BM:  01/26/20  Height:   Ht Readings from Last 1 Encounters:  01/05/20 '5\' 4"'$  (1.626 m)    Weight:   Wt Readings from Last 1 Encounters:  01/27/20 88.5 kg    Ideal Body Weight:  54.5 kg  BMI:  Body mass index is 33.49 kg/m.  Estimated Nutritional Needs:   Kcal:  1700-1900  Protein:  110-136 grams  Fluid:  >1.5 L/day    Bailey ChanceD, LDN, CDCMiracle Valleygistered Dietitian II Certified Diabetes Care and Education Specialist Please refer to [facility]Re[facility] Center Hospitalr RD and/or RD on-call/weekend/after hours pager

## 2020-01-28 NOTE — Progress Notes (Signed)
Bailey Cox Progress Note    74 y.o.femalewho had a prolonged 81 day hospitalization at [facility] with  HTN COPD DM hypothyroidism Ogilvie's syndrome. PEA arrest during EGD in early December.Intermittent hyperkalemia treatet medically and with HD at OSH and now recently w/ worsening renal function over the past few days prior to admission to [facility]. She actually had a foley placed 12/28/19 when the renal function was worsening with return of purulence - started on Meropenem. Per review of chart the pt did not want RRT but after urging by family agreed to it.   Assessment/ Plan:   1. AKI on CKD with most likely cause of the AKI being ATN from sepsis. Started CRRT on 3/7-3/11 for clearance and also for the anasarca/ volume management-   stopped CRRT on 3/11.  Suspect will need ongoing RRT in the form of IHD.  Planning for IHD to keep on TTS  schedule while here via [place].  HD tomorrow.  Getting 3-3.5L UF per rx > she looks fairly euvolemic now so will try 2-2.5L tomorrow.  Not on midodrine.    2. Sepsis -  Resolved, off all antibiotics, suspected urosepsis  3. H/o PEA arrest  4. H/o MRSA PNA  5. Respiratory failure with  Trach-->decannulated 3/19 and is doing well  6. HTN/Vol: acceptable  7. Anemia-   Aranesp 100 mcg q Tues - will increase if Hb not trending up out of 7s this week.  Iron OK when checked 3/9.  8. Dispo: pending.  Looks like she is dialysis dependent for now, since decannulated has many more options for dispo.  Will try to have her dialyze in a chair tomorrow.   Spoke with granddaughter Autumn re: updates --> no sign of renal recovery and ongoing need for dialysis now.   Subjective:    Seen in room.  No new issues. Remains anuric.   Objective:   BP (!) 142/58   Pulse 81   Temp 98 F (36.7 C) (Oral)   Resp 18   Ht '5\' 4"'$  (1.626 m)   Wt 88.5 kg   SpO2 98%   BMI 33.49 kg/m   Intake/Output Summary (Last 24 hours) at 01/28/2020 1135 Last data  filed at 01/27/2020 1607 Gross per 24 hour  Intake 360 ml  Output --  Net 360 ml   Weight change:   Physical Exam: GEN: NAD, awake and alert, shakes head to answer questions HEENT: EOMI NECK: Supple, + trach site dressed LUNGS: CTA anteriorly CV: RRR, No M/R/G ABD: NA EXT: trace dependent and flank edema ACCESS: RIJ TDC  Imaging: No results found.  Labs: BMET Recent Labs  Lab 01/24/20 0441 01/24/20 1950 01/25/20 0354 01/25/20 1515 01/26/20 0436 01/27/20 0422 01/28/20 0425  NA 134* 133* 136 134* 134* 133* 134*  K 4.3 3.6 3.7 4.2 4.1 4.3 3.7  CL 94* 95* 97* 94* 94* 95* 96*  CO2 '28 30 31 30 30 28 29  '$ GLUCOSE 122* 126* 122* 135* 127* 116* 154*  BUN 72* 33* 41* 53* 67* 97* 54*  CREATININE 2.25* 1.34* 1.66* 1.98* 2.53* 2.87* 1.86*  CALCIUM 8.5* 8.0* 8.4* 8.6* 8.7* 8.5* 8.5*  PHOS 6.1* 3.0 3.4 4.7* 5.4* 6.0* 3.4   CBC Recent Labs  Lab 01/22/20 1543 01/22/20 1543 01/23/20 0247 01/23/20 0247 01/24/20 0441 01/25/20 0354 01/26/20 0436 01/27/20 0422 01/27/20 0719 01/28/20 0425  WBC 3.6*   < > 3.6*  --  4.2  --  4.5  --  4.7  --  NEUTROABS 2.6  --   --   --   --   --  3.1  --   --   --   HGB 7.7*   < > 7.6*   < > 7.4*   < > 7.7* 7.4* 7.7* 8.3*  HCT 25.8*   < > 25.1*   < > 25.0*   < > 25.0* 24.1* 25.4* 26.9*  MCV 96.6   < > 98.8  --  98.0  --  95.4  --  97.3  --   PLT 68*   < > 71*  --  71*  --  90*  --  106*  --    < > = values in this interval not displayed.    Medications:    . B-complex with vitamin C  1 tablet Per Tube Daily  . busPIRone  5 mg Oral BID  . chlorhexidine gluconate (MEDLINE KIT)  15 mL Mouth Rinse BID  . Chlorhexidine Gluconate Cloth  6 each Topical Daily  . Chlorhexidine Gluconate Cloth  6 each Topical Q0600  . darbepoetin (ARANESP) injection - DIALYSIS  100 mcg Intravenous Q Tue-HD  . feeding supplement (NEPRO CARB STEADY)  1,000 mL Oral Q24H  . feeding supplement (PRO-STAT SUGAR FREE 64)  30 mL Per Tube TID  . heparin  5,000 Units  Subcutaneous Q8H  . insulin aspart  0-15 Units Subcutaneous Q4H  . mouth rinse  15 mL Mouth Rinse 10 times per day  . pantoprazole sodium  40 mg Per Tube Q24H  . PARoxetine  40 mg Per Tube Daily  . sodium chloride flush  10-40 mL Intracatheter Q12H  . traZODone  25 mg Per Tube QHS      Justin Mend  01/28/2020, 11:35 AM

## 2020-01-28 NOTE — TOC Progression Note (Signed)
Transition of Care [facility]) - Progression Note    Patient Details  Name: Bailey Cox MRN: [id number] Date of Birth: 18-Aug-1946  Transition of Care [facility]) CM/SW [place], [place] Phone Number: 01/28/2020, 11:17 AM  Clinical Narrative:    CSW spoke with pt granddaughter Bailey Cox at [phone]. Introduced self, role, reason for call. Bailey Cox is very thankful for the care being provided for pt at [facility]. We discussed that pt will need assistance when she is ready for discharge and that we are contacting family to see what their preferred disposition for pt is, whether that be closer to home or closer to [place].   Pt granddaughter states they prefer for her to be closer to [facility]. CSW discussed that barriers to that plan may be cost of transport for pt to get closer to home, and that we request family assistance with which facilities they would like for her information to be sent to. We also briefly discussed that should pt continue to need dialysis that she would need to sit up in the recliner to be able to go to outpatient center. Bailey Cox with questions about if dialysis would be needed at discharge. CSW deferred these questions to renal team, pt granddaughter okay with them calling her.   Pt granddaughter given information about Medicare.gov and how to search for SNFs on that site.  CSW spoke with Jaclyn Shaggy, renal navigator and she will have nephrologist reach out to granddaughter.   A point of clarification: Bailey Cox is pt daughter and Bailey Cox's aunt. They both are involved in decision making, no ACP documents noted in chart.    Expected Discharge Plan: [place] Barriers to Discharge: Continued Medical Work up, Other (comment)(ability to tolerate dialysis in recliner)  Expected Discharge Plan and Services Expected Discharge Plan: [place] In-house Referral: Clinical Social Work Discharge Planning Services: CM Consult Post Acute Care  Choice: [place] arrangements for the past 2 months: [place]   Readmission Risk Interventions No flowsheet data found.

## 2020-01-28 NOTE — Progress Notes (Signed)
PROGRESS NOTE    Shayana Hornstein  ZOX:[id number] DOB: 02-18-1946 DOA: 01/05/2020 PCP: Patient, No Pcp Per   Brief Narrative: Per chart review/previous attending:74 year old obese Caucasian female who was admitted on 01/05/2020 from [facility] where she had just arrived after a long admission to [facility] where she was hospitalized from 10/2019 up until 01/05/2020 where she suffered a PEA arrest during endoscopy and ended up requiring a tracheostomy. She presented to [facility] with widespread peripheral edema and acidosis.   Originally she was hospitalized in September 2020 with acute cholecystitis and cholecystectomy. 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 While hospitalized she developed fluid overload and ultimately transferred to [facility] for dialysis. Her clinical course after that point became somewhat unclear but family described that she was on a ventilator for several procedures and was eventually left intubated. 1 of those procedures and endoscopy which she unfortunately suffered a cardiac arrest. Again details of the cardiac arrest or not clear but she remained on the ventilator and ultimately underwent a tracheostomy. At that time her hospital course was also complicated by MRSA pneumonia. She is ultimately able to come off of hemodialysis and treated with intermittent diuresis. During her hospital stay she bounced in and out of the ICU for a few times with volume overload and at the end of her admission at [facility] she still struggled with kidney disease and was being treated for urinary tract infection with vancomycin and cefepime. Plans were stopped this on 3 2. She was discharged on the [facility] system on [facility] in [place] upon arrival to [facility] they deemed her too sick for admission transferred her directly to [facility] emergency department with complaints of volume  overload and acidosis. Since then she has been in ICU and has been off of the ventilator and now on trach collar. She underwent CRRT from 3 9 until 312 and transferred to the floor on 3/14 after she was tolerating hemodialysis. Currently nephrology has been consulted and they feel that she has an AKI on CKD most likely cause of her AKI being from ATN from sepsis and family still wants aggressive measures so she was on CRRT from 3 7 until 311 for clearance and also for anasarca and volume management. Her CRRT was stopped on 3 11 and Dr. Moshe Cipro suspected that she will likely require renal replacement therapy in the setting of intermittent hemodialysis. They feel that if she has a trach and is dialysis dependent the only option would be send her to LTAC. The plan is for intermittent hemodialysis schedule on TTS here via [facility]. Placement is likely going to be an issue because of this.  PT OT recommending SNF at this time.  Patient with no days left for LTAC.  Subjective: Alert,awake, oriented to self/DOB- not to place- thinks it is church. On 4l [place]. swollen LUE>RUE. PICC on RUE, midl leg edema bl Peg+ and TF running at 40 ml/hr w/ 30 ml q 4 hr flush TRACH site with dressing and healing  Overnight no fever T-max 98, blood pressure stable 1 81-1 91Y systolic. Labs showed potassium 3.7, BUN/creatinine 54/1.8. Hb 8.3 gm.   Assessment & Plan:  MRSA pneumonia: Patient has completed antibiotics.  AKI on CKD stage IV: Initially with AKI/ATN from sepsis.  Family desire renal [place] therapy and patient on intermittent HD initially currently per nephrology on HD TTS- s/p HD on 01/22/2020, 01/24/2020, 01/27/2020.  Per nephrology patient looks like she is dialysis dependent now.  Patient likely to sit in recliner for next hemodialysis session 3/25 WITH 2-2.5L uf PLAN.   Chronic respiratory failure, with tracheostomy- dencannulated 3/19/ history of PEA arrest: Had tracheostomy following PEA arrest  previously in 2020- was on vent, off ventilator since 2/8.  Seen by pulmonary, speech therapy RT-tolerated capping 3/18 and 19 and status post decannulation. cont SLP   T2DM, HbA1c stable 5.7 on sliding scale insulin.  Glucose is stable as below Recent Labs  Lab 01/27/20 1951 01/28/20 0022 01/28/20 0341 01/28/20 0726 01/28/20 1137  GLUCAP 131* 136* 156* 150* 122*   Hypertension: Blood pressure fairly stable volume being adjusted with dialysis for fluid overload.  Anemia likely multifactorial, anemia of chronic kidney disease.  Continue Aranesp every Tuesday per nephrology. Recent Labs  Lab 01/25/20 0354 01/26/20 0436 01/27/20 0422 01/27/20 0719 01/28/20 0425  HGB 7.5* 7.7* 7.4* 7.7* 8.3*  HCT 25.0* 25.0* 24.1* 25.4* 26.9*    Hyponatremia: Resolved.  Anxiety disorder: On Paxil dose increased, and Xanax as needed and bedtime trazodone.  Patient is also back on BuSpar 5 mg  GERD- on PPI  Nutrition: Diet Order            Diet NPO time specified Except for: Ice Chips, Other (See Comments)  Diet effective now              Nutrition Problem: Inadequate oral intake Etiology: inability to eat Signs/Symptoms: NPO status Interventions: Tube feeding Body mass index is 33.49 kg/m.   DVT prophylaxis:Heparin Code Status: Full code Family Communication: plan of care discussed with patient. Disposition Plan: Patient came from: [facility]                                                                                                                         Anticipated d/c place: SNF Barriers to d/c OR conditions which need to be met to effect a safe d/c: Patient was in the ICU for 14 days status post tracheostomy required intermittent dialysis.  Patient has been refused for [facility] as well as select specialty LT[facility].  Likely to skilled nursing facility now patient has been decannulated-planning for dialysis 3/25 in the chair in preparation for outpatient dialysis.  Social  worker on board for placement.   Consultants:  Nephrology: Dr. Augustin Coupe 01/06/2020  Wound care  Entered interventional radiology  Procedures:see note PICC pre admit > Trach pre admit > 3/5 IJ vas cath->  Left upper extremity Doppler negative for DVT 01/19/2020 Echo 10/24/2019>LVEF 55%, Grade 1 DD. RV systolic function normal.  Echo 3/2: LVEF 65-70%, grade II dysfunction. RVSP.  Microbiology:see note Blood 3/1 >> ng Urine 3/1 >> ng resp 3/4 >>pan-sensitivepseudomonas   Medications: Scheduled Meds: . B-complex with vitamin C  1 tablet Per Tube Daily  . busPIRone  5 mg Oral BID  . chlorhexidine gluconate (MEDLINE KIT)  15 mL Mouth Rinse BID  . Chlorhexidine Gluconate Cloth  6 each  Topical Daily  . Chlorhexidine Gluconate Cloth  6 each Topical Q0600  . darbepoetin (ARANESP) injection - DIALYSIS  100 mcg Intravenous Q Tue-HD  . feeding supplement (NEPRO CARB STEADY)  1,000 mL Oral Q24H  . feeding supplement (PRO-STAT SUGAR FREE 64)  30 mL Per Tube TID  . heparin  5,000 Units Subcutaneous Q8H  . insulin aspart  0-15 Units Subcutaneous Q4H  . mouth rinse  15 mL Mouth Rinse 10 times per day  . pantoprazole sodium  40 mg Per Tube Q24H  . PARoxetine  40 mg Per Tube Daily  . sodium chloride flush  10-40 mL Intracatheter Q12H  . traZODone  25 mg Per Tube QHS   Continuous Infusions: . sodium chloride      Antimicrobials: Anti-infectives (From admission, onward)   Start     Dose/Rate Route Frequency Ordered Stop   01/10/20 0930  vancomycin (VANCOCIN) IVPB 1000 mg/200 mL premix  Status:  Discontinued     1,000 mg 200 mL/hr over 60 Minutes Intravenous  Once 01/10/20 0915 01/13/20 0623   01/06/20 2200  ceFEPIme (MAXIPIME) 2 g in sodium chloride 0.9 % 100 mL IVPB     2 g 200 mL/hr over 30 Minutes Intravenous Every 24 hours 01/05/20 2251 01/11/20 2234   01/05/20 2200  ceFEPIme (MAXIPIME) 2 g in sodium chloride 0.9 % 100 mL IVPB     2 g 200 mL/hr over 30 Minutes Intravenous  Once  01/05/20 2148 01/06/20 0015   01/05/20 2200  metroNIDAZOLE (FLAGYL) IVPB 500 mg     500 mg 100 mL/hr over 60 Minutes Intravenous  Once 01/05/20 2148 01/06/20 0015   01/05/20 2200  vancomycin (VANCOCIN) IVPB 1000 mg/200 mL premix  Status:  Discontinued     1,000 mg 200 mL/hr over 60 Minutes Intravenous  Once 01/05/20 2148 01/05/20 2151   01/05/20 2200  vancomycin (VANCOREADY) IVPB 2000 mg/400 mL  Status:  Discontinued     2,000 mg 200 mL/hr over 120 Minutes Intravenous  Once 01/05/20 2151 01/05/20 2223       Objective: Vitals: Today's Vitals   01/27/20 1208 01/27/20 1953 01/28/20 0341 01/28/20 0815  BP:  140/66 (!) 142/58   Pulse:  78 81   Resp:  18 18   Temp:  98 F (36.7 C) 98 F (36.7 C)   TempSrc:  Axillary Oral   SpO2:  99% 98%   Weight:      Height:      PainSc: 0-No pain 0-No pain  0-No pain    Intake/Output Summary (Last 24 hours) at 01/28/2020 1323 Last data filed at 01/27/2020 1607 Gross per 24 hour  Intake 360 ml  Output --  Net 360 ml   Filed Weights   01/24/20 1530 01/27/20 0500 01/27/20 0655  Weight: 91.4 kg 91.1 kg 88.5 kg   Weight change:    Intake/Output from previous day: 03/23 0701 - 03/24 0700 In: 360 [NG/GT:360] Out: 3526  Intake/Output this shift: No intake/output data recorded.  Examination:  General exam: AAO, thin and frail,NAD, weak appearing.  Thin and frail. HEENT:Oral mucosa moist, Ear/Nose WNL grossly,dentition normal.  Trach site decannulated and healing Respiratory system: bilaterally clear,no wheezing or crackles,no use of accessory muscle, non tender. Cardiovascular system: S1 & S2 +, regular, No JVD. Gastrointestinal system: Abdomen soft, NT,ND, BS+.  PEG present Nervous System:Alert, awake, moving extremities and grossly nonfocal Extremities: No edema, distal peripheral pulses palpable.  Skin: No rashes,no icterus. MSK: Normal muscle bulk,tone, power PICC present  in right upper extremity HD catheter on rt chest  Data  Reviewed: I have personally reviewed following labs and imaging studies CBC: Recent Labs  Lab 01/22/20 1543 01/22/20 1543 01/23/20 0247 01/23/20 0247 01/24/20 0441 01/24/20 0441 01/25/20 0354 01/26/20 0436 01/27/20 0422 01/27/20 0719 01/28/20 0425  WBC 3.6*  --  3.6*  --  4.2  --   --  4.5  --  4.7  --   NEUTROABS 2.6  --   --   --   --   --   --  3.1  --   --   --   HGB 7.7*   < > 7.6*   < > 7.4*   < > 7.5* 7.7* 7.4* 7.7* 8.3*  HCT 25.8*   < > 25.1*   < > 25.0*   < > 25.0* 25.0* 24.1* 25.4* 26.9*  MCV 96.6  --  98.8  --  98.0  --   --  95.4  --  97.3  --   PLT 68*  --  71*  --  71*  --   --  90*  --  106*  --    < > = values in this interval not displayed.   Basic Metabolic Panel: Recent Labs  Lab 01/24/20 0441 01/24/20 1950 01/25/20 0354 01/25/20 1515 01/26/20 0436 01/27/20 0422 01/28/20 0425  NA 134*   < > 136 134* 134* 133* 134*  K 4.3   < > 3.7 4.2 4.1 4.3 3.7  CL 94*   < > 97* 94* 94* 95* 96*  CO2 28   < > '31 30 30 28 29  '$ GLUCOSE 122*   < > 122* 135* 127* 116* 154*  BUN 72*   < > 41* 53* 67* 97* 54*  CREATININE 2.25*   < > 1.66* 1.98* 2.53* 2.87* 1.86*  CALCIUM 8.5*   < > 8.4* 8.6* 8.7* 8.5* 8.5*  MG 2.5*  --  2.3  --  2.6* 2.8* 2.4  PHOS 6.1*   < > 3.4 4.7* 5.4* 6.0* 3.4   < > = values in this interval not displayed.   GFR: Estimated Creatinine Clearance: 29.9 mL/min (A) (by C-G formula based on SCr of 1.86 mg/dL (H)). Liver Function Tests: Recent Labs  Lab 01/25/20 0354 01/25/20 1515 01/26/20 0436 01/27/20 0422 01/28/20 0425  ALBUMIN 2.4* 2.5* 2.4* 2.5* 2.6*   No results for input(s): LIPASE, AMYLASE in the last 168 hours. No results for input(s): AMMONIA in the last 168 hours. Coagulation Profile: No results for input(s): INR, PROTIME in the last 168 hours. Cardiac Enzymes: No results for input(s): CKTOTAL, CKMB, CKMBINDEX, TROPONINI in the last 168 hours. BNP (last 3 results) No results for input(s): PROBNP in the last 8760 hours. HbA1C: No  results for input(s): HGBA1C in the last 72 hours. CBG: Recent Labs  Lab 01/27/20 1951 01/28/20 0022 01/28/20 0341 01/28/20 0726 01/28/20 1137  GLUCAP 131* 136* 156* 150* 122*   Lipid Profile: No results for input(s): CHOL, HDL, LDLCALC, TRIG, CHOLHDL, LDLDIRECT in the last 72 hours. Thyroid Function Tests: No results for input(s): TSH, T4TOTAL, FREET4, T3FREE, THYROIDAB in the last 72 hours. Anemia Panel: No results for input(s): VITAMINB12, FOLATE, FERRITIN, TIBC, IRON, RETICCTPCT in the last 72 hours. Sepsis Labs: No results for input(s): PROCALCITON, LATICACIDVEN in the last 168 hours.  No results found for this or any previous visit (from the past 240 hour(s)).    Radiology Studies: No results found.   LOS: 23  days   Time spent: More than 50% of that time was spent in counseling and/or coordination of care.  Antonieta Pert, MD [facility]  01/28/2020, 1:23 PM

## 2020-01-29 DIAGNOSIS — N179 Acute kidney failure, unspecified: Secondary | ICD-10-CM | POA: Diagnosis not present

## 2020-01-29 LAB — CBC
HCT: 27.2 % — ABNORMAL LOW (ref 36.0–46.0)
Hemoglobin: 8.2 g/dL — ABNORMAL LOW (ref 12.0–15.0)
MCH: 28.8 pg (ref 26.0–34.0)
MCHC: 30.1 g/dL (ref 30.0–36.0)
MCV: 95.4 fL (ref 80.0–100.0)
Platelets: 130 10*3/uL — ABNORMAL LOW (ref 150–400)
RBC: 2.85 MIL/uL — ABNORMAL LOW (ref 3.87–5.11)
RDW: 17.9 % — ABNORMAL HIGH (ref 11.5–15.5)
WBC: 6.2 10*3/uL (ref 4.0–10.5)
nRBC: 0 % (ref 0.0–0.2)

## 2020-01-29 LAB — GLUCOSE, CAPILLARY
Glucose-Capillary: 107 mg/dL — ABNORMAL HIGH (ref 70–99)
Glucose-Capillary: 107 mg/dL — ABNORMAL HIGH (ref 70–99)
Glucose-Capillary: 123 mg/dL — ABNORMAL HIGH (ref 70–99)
Glucose-Capillary: 128 mg/dL — ABNORMAL HIGH (ref 70–99)
Glucose-Capillary: 132 mg/dL — ABNORMAL HIGH (ref 70–99)
Glucose-Capillary: 143 mg/dL — ABNORMAL HIGH (ref 70–99)
Glucose-Capillary: 145 mg/dL — ABNORMAL HIGH (ref 70–99)

## 2020-01-29 LAB — RENAL FUNCTION PANEL
Albumin: 2.5 g/dL — ABNORMAL LOW (ref 3.5–5.0)
Anion gap: 11 (ref 5–15)
BUN: 82 mg/dL — ABNORMAL HIGH (ref 8–23)
CO2: 27 mmol/L (ref 22–32)
Calcium: 8.5 mg/dL — ABNORMAL LOW (ref 8.9–10.3)
Chloride: 95 mmol/L — ABNORMAL LOW (ref 98–111)
Creatinine, Ser: 2.27 mg/dL — ABNORMAL HIGH (ref 0.44–1.00)
GFR calc Af Amer: 24 mL/min — ABNORMAL LOW (ref >60)
GFR calc non Af Amer: 21 mL/min — ABNORMAL LOW (ref >60)
Glucose, Bld: 113 mg/dL — ABNORMAL HIGH (ref 70–99)
Phosphorus: 5 mg/dL — ABNORMAL HIGH (ref 2.5–4.6)
Potassium: 3.7 mmol/L (ref 3.5–5.1)
Sodium: 133 mmol/L — ABNORMAL LOW (ref 135–145)

## 2020-01-29 LAB — MAGNESIUM: Magnesium: 2.6 mg/dL — ABNORMAL HIGH (ref 1.7–2.4)

## 2020-01-29 NOTE — Progress Notes (Signed)
PROGRESS NOTE    Bailey Cox  KAJ:[id number] DOB: November 08, 1945 DOA: 01/05/2020 PCP: Patient, No Pcp Per   Brief Narrative: Per chart review/previous attending:74 year old obese Caucasian female who was admitted on 01/05/2020 from [facility] where she had just arrived after a long admission to [facility] where she was hospitalized from 10/2019 up until 01/05/2020 where she suffered a PEA arrest during endoscopy and ended up requiring a tracheostomy. She presented to [facility] with widespread peripheral edema and acidosis.   Originally she was hospitalized in September 2020 with acute cholecystitis and cholecystectomy. 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 While hospitalized she developed fluid overload and ultimately transferred to [facility] for dialysis. Her clinical course after that point became somewhat unclear but family described that she was on a ventilator for several procedures and was eventually left intubated. 1 of those procedures and endoscopy which she unfortunately suffered a cardiac arrest. Again details of the cardiac arrest or not clear but she remained on the ventilator and ultimately underwent a tracheostomy. At that time her hospital course was also complicated by MRSA pneumonia. She is ultimately able to come off of hemodialysis and treated with intermittent diuresis. During her hospital stay she bounced in and out of the ICU for a few times with volume overload and at the end of her admission at [facility] she still struggled with kidney disease and was being treated for urinary tract infection with vancomycin and cefepime. Plans were stopped this on 3 2. She was discharged on the [facility] system on [facility] in [place] upon arrival to [facility] they deemed her too sick for admission transferred her directly to [facility] emergency department with complaints of volume  overload and acidosis. Since then she has been in ICU and has been off of the ventilator and now on trach collar. She underwent CRRT from 3 9 until 312 and transferred to the floor on 3/14 after she was tolerating hemodialysis. Currently nephrology has been consulted and they feel that she has an AKI on CKD most likely cause of her AKI being from ATN from sepsis and family still wants aggressive measures so she was on CRRT from 3 7 until 311 for clearance and also for anasarca and volume management. Her CRRT was stopped on 3 11 and Dr. Moshe Cipro suspected that she will likely require renal replacement therapy in the setting of intermittent hemodialysis. They feel that if she has a trach and is dialysis dependent the only option would be send her to LTAC. The plan is for intermittent hemodialysis schedule on TTS here via [facility]. Placement is likely going to be an issue because of this.  PT OT recommending SNF at this time.  Patient with no days left for LTAC.  Subjective:  Alert,[facility]. No acute events overnight. On 4l [place]. PICC on RUE Mild leg edema b/l Peg+ and TF is running at 40 ml/hr w/ 30 ml q 4 hr flush TRACH site with dressing intact and is healing   Assessment & Plan:  MRSA pneumonia: Patient has completed antibiotics.  Watch her closely, she is afebrile.  AKI on CKD stage IV: Initially with AKI/ATN from sepsis.  Family desire renal [place] therapy and patient on intermittent HD initially currently per nephrology on HD TTS- s/p HD on 01/22/2020, 01/24/2020, 01/27/2020. Per nephrology patient looks like she is dialysis dependent now.  Patient likely to sit in recliner for  hemodialysis session 3/25 WITH 2-2.5L UF.  Discussed with nephrology today.  Chronic respiratory failure, with tracheostomy- dencannulated 3/19/ history of PEA arrest: Had tracheostomy following PEA arrest previously in 2020- was on vent, off ventilator since 2/8.   Seen by pulmonary, speech therapy RT-tolerated capping 3/18 and 19 and status post decannulation. cont SLP   T2DM, HbA1c stable 5.7 on sliding scale insulin.  Glucose is stable as below.  Monitor. Recent Labs  Lab 01/28/20 1631 01/28/20 1950 01/29/20 0020 01/29/20 0332 01/29/20 0720  GLUCAP 124* 122* 107* 107* 145*   Hypertension: Blood pressure fairly stable volume being adjusted with dialysis for fluid overload.  Anemia likely multifactorial, anemia of chronic kidney disease.  Continue Aranesp every Tuesday per nephrology. Recent Labs  Lab 01/26/20 0436 01/27/20 0422 01/27/20 0719 01/28/20 0425 01/29/20 0340  HGB 7.7* 7.4* 7.7* 8.3* 8.2*  HCT 25.0* 24.1* 25.4* 26.9* 27.2*   Hyponatremia: Resolved.  Anxiety disorder: On Paxil dose increased, and Xanax as needed and bedtime trazodone.  Patient is also back on BuSpar 5 mg  GERD-on PPI  GOC:She is full code.She had palliative care discussion while she was in [place] per daughter. I Spoke with daughter-We discussed about palliative care follow along for [place] discussion.Daughter reports she has not been up for few months and has been in and out of hospital multiple times.  She understands patient prognosis guarded.  We will continue to provide supportive care.  Nutrition: Diet Order            Diet NPO time specified Except for: Ice Chips, Other (See Comments)  Diet effective now              Nutrition Problem: Inadequate oral intake Etiology: inability to eat Signs/Symptoms: NPO status Interventions: Tube feeding Body mass index is 33.49 kg/m.   DVT prophylaxis:Heparin Code Status: Full code Family Communication: plan of care discussed with patient. Updated her daughter Bailey Cox. Disposition Plan: Patient came from: [facility]                                                                                                                         Anticipated d/c place:SNF Barriers to d/c OR conditions which  need to be met to effect a safe d/c: Patient was in the ICU for 14 days status post tracheostomy required intermittent dialysis.  Patient has been refused for [facility] as well as select specialty LTAC.  Likely to skilled nursing facility now patient has been decannulated-planning for dialysis 3/25 in the chair recliner in preparation for outpatient dialysis.  Social worker on board for placement.   Consultants:  Nephrology: Dr. Augustin Coupe 01/06/2020  Wound care  Entered interventional radiology  Procedures:see note PICC pre admit > Trach pre admit > 3/5 IJ vas cath->  Left upper extremity Doppler negative for DVT 01/19/2020 Echo 10/24/2019>LVEF 55%, Grade 1 DD. RV systolic function normal.  Echo 3/2: LVEF 65-70%, grade II  dysfunction. RVSP.  Microbiology:see note Blood 3/1 >> ng Urine 3/1 >> ng resp 3/4 >>pan-sensitivepseudomonas   Medications: Scheduled Meds: . B-complex with vitamin C  1 tablet Per Tube Daily  . busPIRone  5 mg Oral BID  . chlorhexidine gluconate (MEDLINE KIT)  15 mL Mouth Rinse BID  . Chlorhexidine Gluconate Cloth  6 each Topical Daily  . Chlorhexidine Gluconate Cloth  6 each Topical Q0600  . darbepoetin (ARANESP) injection - DIALYSIS  100 mcg Intravenous Q Tue-HD  . feeding supplement (NEPRO CARB STEADY)  1,000 mL Oral Q24H  . feeding supplement (PRO-STAT SUGAR FREE 64)  30 mL Per Tube TID  . heparin  5,000 Units Subcutaneous Q8H  . insulin aspart  0-15 Units Subcutaneous Q4H  . mouth rinse  15 mL Mouth Rinse 10 times per day  . pantoprazole sodium  40 mg Per Tube Q24H  . PARoxetine  40 mg Per Tube Daily  . sodium chloride flush  10-40 mL Intracatheter Q12H  . traZODone  25 mg Per Tube QHS   Continuous Infusions: . sodium chloride      Antimicrobials: Anti-infectives (From admission, onward)   Start     Dose/Rate Route Frequency Ordered Stop   01/10/20 0930  vancomycin (VANCOCIN) IVPB 1000 mg/200 mL premix  Status:  Discontinued     1,000 mg 200  mL/hr over 60 Minutes Intravenous  Once 01/10/20 0915 01/13/20 0623   01/06/20 2200  ceFEPIme (MAXIPIME) 2 g in sodium chloride 0.9 % 100 mL IVPB     2 g 200 mL/hr over 30 Minutes Intravenous Every 24 hours 01/05/20 2251 01/11/20 2234   01/05/20 2200  ceFEPIme (MAXIPIME) 2 g in sodium chloride 0.9 % 100 mL IVPB     2 g 200 mL/hr over 30 Minutes Intravenous  Once 01/05/20 2148 01/06/20 0015   01/05/20 2200  metroNIDAZOLE (FLAGYL) IVPB 500 mg     500 mg 100 mL/hr over 60 Minutes Intravenous  Once 01/05/20 2148 01/06/20 0015   01/05/20 2200  vancomycin (VANCOCIN) IVPB 1000 mg/200 mL premix  Status:  Discontinued     1,000 mg 200 mL/hr over 60 Minutes Intravenous  Once 01/05/20 2148 01/05/20 2151   01/05/20 2200  vancomycin (VANCOREADY) IVPB 2000 mg/400 mL  Status:  Discontinued     2,000 mg 200 mL/hr over 120 Minutes Intravenous  Once 01/05/20 2151 01/05/20 2223       Objective: Vitals: Today's Vitals   01/28/20 1948 01/28/20 2200 01/29/20 0336 01/29/20 0754  BP: (!) 157/72  (!) 164/82   Pulse: 80  81 80  Resp: '18  18 17  '$ Temp: 98.8 F (37.1 C)  98.8 F (37.1 C) 97.7 F (36.5 C)  TempSrc: Oral  Oral Oral  SpO2: 100%  100% 100%  Weight:      Height:      PainSc:  0-No pain  0-No pain    Intake/Output Summary (Last 24 hours) at 01/29/2020 1147 Last data filed at 01/29/2020 0500 Gross per 24 hour  Intake 0 ml  Output 400 ml  Net -400 ml   Filed Weights   01/24/20 1530 01/27/20 0500 01/27/20 0655  Weight: 91.4 kg 91.1 kg 88.5 kg   Weight change:    Intake/Output from previous day: 03/24 0701 - 03/25 0700 In: 0  Out: 400 [Urine:400] Intake/Output this shift: No intake/output data recorded.  Examination:  General exam: Weak frail, lethargic, not in acute distress. HEENT:Oral mucosa moist, Ear/Nose WNL grossly,dentition normal.  Trach site  decannulated and healing Respiratory system: bilaterally clear,no use of accessory muscle, non tender. Cardiovascular system: S1  & S2 +, regular, No JVD. Gastrointestinal system: Abdomen soft, NT,ND, BS+.  PEG present-feeding running Nervous System:Alert, awake, moving extremities and grossly nonfocal Extremities: No edema, distal peripheral pulses palpable.  Skin: No rashes,no icterus. MSK: Normal muscle bulk,tone, power PICC present in right upper extremity HD catheter on rt chest  Data Reviewed: I have personally reviewed following labs and imaging studies CBC: Recent Labs  Lab 01/22/20 1543 01/22/20 1543 01/23/20 0247 01/23/20 0247 01/24/20 0441 01/25/20 0354 01/26/20 0436 01/27/20 0422 01/27/20 0719 01/28/20 0425 01/29/20 0340  WBC 3.6*   < > 3.6*  --  4.2  --  4.5  --  4.7  --  6.2  NEUTROABS 2.6  --   --   --   --   --  3.1  --   --   --   --   HGB 7.7*   < > 7.6*   < > 7.4*   < > 7.7* 7.4* 7.7* 8.3* 8.2*  HCT 25.8*   < > 25.1*   < > 25.0*   < > 25.0* 24.1* 25.4* 26.9* 27.2*  MCV 96.6   < > 98.8  --  98.0  --  95.4  --  97.3  --  95.4  PLT 68*   < > 71*  --  71*  --  90*  --  106*  --  130*   < > = values in this interval not displayed.   Basic Metabolic Panel: Recent Labs  Lab 01/25/20 0354 01/25/20 0354 01/25/20 1515 01/26/20 0436 01/27/20 0422 01/28/20 0425 01/29/20 0340  NA 136   < > 134* 134* 133* 134* 133*  K 3.7   < > 4.2 4.1 4.3 3.7 3.7  CL 97*   < > 94* 94* 95* 96* 95*  CO2 31   < > '30 30 28 29 27  '$ GLUCOSE 122*   < > 135* 127* 116* 154* 113*  BUN 41*   < > 53* 67* 97* 54* 82*  CREATININE 1.66*   < > 1.98* 2.53* 2.87* 1.86* 2.27*  CALCIUM 8.4*   < > 8.6* 8.7* 8.5* 8.5* 8.5*  MG 2.3  --   --  2.6* 2.8* 2.4 2.6*  PHOS 3.4   < > 4.7* 5.4* 6.0* 3.4 5.0*   < > = values in this interval not displayed.   GFR: Estimated Creatinine Clearance: 24.5 mL/min (A) (by C-G formula based on SCr of 2.27 mg/dL (H)). Liver Function Tests: Recent Labs  Lab 01/25/20 1515 01/26/20 0436 01/27/20 0422 01/28/20 0425 01/29/20 0340  ALBUMIN 2.5* 2.4* 2.5* 2.6* 2.5*   No results for  input(s): LIPASE, AMYLASE in the last 168 hours. No results for input(s): AMMONIA in the last 168 hours. Coagulation Profile: No results for input(s): INR, PROTIME in the last 168 hours. Cardiac Enzymes: No results for input(s): CKTOTAL, CKMB, CKMBINDEX, TROPONINI in the last 168 hours. BNP (last 3 results) No results for input(s): PROBNP in the last 8760 hours. HbA1C: No results for input(s): HGBA1C in the last 72 hours. CBG: Recent Labs  Lab 01/28/20 1631 01/28/20 1950 01/29/20 0020 01/29/20 0332 01/29/20 0720  GLUCAP 124* 122* 107* 107* 145*   Lipid Profile: No results for input(s): CHOL, HDL, LDLCALC, TRIG, CHOLHDL, LDLDIRECT in the last 72 hours. Thyroid Function Tests: No results for input(s): TSH, T4TOTAL, FREET4, T3FREE, THYROIDAB in the last 72 hours. Anemia  Panel: No results for input(s): VITAMINB12, FOLATE, FERRITIN, TIBC, IRON, RETICCTPCT in the last 72 hours. Sepsis Labs: No results for input(s): PROCALCITON, LATICACIDVEN in the last 168 hours.  No results found for this or any previous visit (from the past 240 hour(s)).    Radiology Studies: No results found.   LOS: 24 days   Time spent: More than 50% of that time was spent in counseling and/or coordination of care.  Antonieta Pert, MD [facility]  01/29/2020, 11:47 AM

## 2020-01-29 NOTE — Progress Notes (Addendum)
[facility] Progress Note    74 y.o.femalewho had a prolonged 81 day hospitalization at [facility] with  HTN COPD DM hypothyroidism Ogilvie's syndrome. PEA arrest during EGD in early December.Intermittent hyperkalemia treatet medically and with HD at OSH and now recently w/ worsening renal function over the past few days prior to admission to [facility]. She actually had a foley placed 12/28/19 when the renal function was worsening with return of purulence - started on Meropenem. Per review of chart the pt did not want RRT but after urging by family agreed to it.   Assessment/ Plan:   1. AKI on CKD with most likely cause of the AKI being ATN from sepsis. Started CRRT on 3/7-3/11 for clearance and also for the anasarca/ volume management-   stopped CRRT on 3/11.  Suspect will need ongoing RRT in the form of IHD.  Planning for IHD to keep on TTS  schedule while here via [place].  HD today.  She looks fairly euvolemic now so will try 2-2.5L tomorrow.  Not on midodrine.  She did have UOP 474m documented yesterday.   2. Sepsis -  Resolved, off all antibiotics, suspected urosepsis  3. H/o PEA arrest  4. H/o MRSA PNA  5. Respiratory failure with  Trach-->decannulated 3/19 and is doing well  6. HTN/Vol: acceptable  7. Anemia-   Aranesp 100 mcg q Tues - will increase if Hb not trending up out of 7s this week.  Iron OK when checked 3/9.  8. Dispo: pending.  Looks like she is dialysis dependent for now, since decannulated has many more options for dispo.  Declined HD in chair today due to pain --> perhaps PT can give her a positioner pillow that would make it more comfortable? Spoke with granddaughter Autumn 3/24 re: updates --> no sign of renal recovery and ongoing need for dialysis now.   ADDENDUM: Pt declined HD altogether today.  I called to update Autumn and she requests that we be more forceful and insist that BIvorigo to dialysis even if she declines.  If family calls to  encourage her she doesn't answer the phone. I feel that the patient is able to indicate decision for herself and I cannot force her to come to treatment.  We will continue to encourage her as she's indicated to family she wants to live even if it's on dialysis.  Autumn thinks that once her buspar and paxil are at normal doses she will be more agreeable to proceed.  Autumn also had concern with use of alprazolam - the last 0.222mPRN dose was given 3/23 at ~6pm but I discontinued this from her med list.   Subjective:    Seen in room.  Discussed attending HD in chair today and she adamantly declines citing buttocks pain.   UOP 40014mesterday.   Objective:   BP (!) 164/82   Pulse 80   Temp 97.7 F (36.5 C) (Oral)   Resp 17   Ht _0  (1.626 m)   Wt 88.5 kg   SpO2 100%   BMI 33.49 kg/m   Intake/Output Summary (Last 24 hours) at 01/29/2020 1110 Last data filed at 01/29/2020 0500 Gross per 24 hour  Intake 0 ml  Output 400 ml  Net -400 ml   Weight change:   Physical Exam: GEN: NAD, awake and alert, shakes head to answer questions HEENT: EOMI NECK: Supple, + trach site dressed LUNGS: CTA anteriorly CV: RRR, No M/R/G ABD: NA EXT: trace dependent  and flank edema ACCESS: RIJ TDC  Imaging: No results found.  Labs: BMET Recent Labs  Lab 01/24/20 1950 01/25/20 0354 01/25/20 1515 01/26/20 0436 01/27/20 0422 01/28/20 0425 01/29/20 0340  NA 133* 136 134* 134* 133* 134* 133*  K 3.6 3.7 4.2 4.1 4.3 3.7 3.7  CL 95* 97* 94* 94* 95* 96* 95*  CO2 _0 GLUCOSE 126* 122* 135* 127* 116* 154* 113*  BUN 33* 41* 53* 67* 97* 54* 82*  CREATININE 1.34* 1.66* 1.98* 2.53* 2.87* 1.86* 2.27*  CALCIUM 8.0* 8.4* 8.6* 8.7* 8.5* 8.5* 8.5*  PHOS 3.0 3.4 4.7* 5.4* 6.0* 3.4 5.0*   CBC Recent Labs  Lab 01/22/20 1543 01/23/20 0247 01/24/20 0441 01/25/20 0354 01/26/20 0436 01/26/20 0436 01/27/20 0422 01/27/20 0719 01/28/20 0425 01/29/20 0340  WBC 3.6*   < > 4.2  --  4.5   --   --  4.7  --  6.2  NEUTROABS 2.6  --   --   --  3.1  --   --   --   --   --   HGB 7.7*   < > 7.4*   < > 7.7*   < > 7.4* 7.7* 8.3* 8.2*  HCT 25.8*   < > 25.0*   < > 25.0*   < > 24.1* 25.4* 26.9* 27.2*  MCV 96.6   < > 98.0  --  95.4  --   --  97.3  --  95.4  PLT 68*   < > 71*  --  90*  --   --  106*  --  130*   < > = values in this interval not displayed.    Medications:    . B-complex with vitamin C  1 tablet Per Tube Daily  . busPIRone  5 mg Oral BID  . chlorhexidine gluconate (MEDLINE KIT)  15 mL Mouth Rinse BID  . Chlorhexidine Gluconate Cloth  6 each Topical Daily  . Chlorhexidine Gluconate Cloth  6 each Topical Q0600  . darbepoetin (ARANESP) injection - DIALYSIS  100 mcg Intravenous Q Tue-HD  . feeding supplement (NEPRO CARB STEADY)  1,000 mL Oral Q24H  . feeding supplement (PRO-STAT SUGAR FREE 64)  30 mL Per Tube TID  . heparin  5,000 Units Subcutaneous Q8H  . insulin aspart  0-15 Units Subcutaneous Q4H  . mouth rinse  15 mL Mouth Rinse 10 times per day  . pantoprazole sodium  40 mg Per Tube Q24H  . PARoxetine  40 mg Per Tube Daily  . sodium chloride flush  10-40 mL Intracatheter Q12H  . traZODone  25 mg Per Tube QHS      Justin Mend  01/29/2020, 11:10 AM

## 2020-01-29 NOTE — Plan of Care (Signed)
  Problem: Education: Goal: Knowledge of General Education information will improve Description: Including pain rating scale, medication(s)/side effects and non-pharmacologic comfort measures Outcome: Progressing   Problem: Health Behavior/Discharge Planning: Goal: Ability to manage health-related needs will improve Outcome: Progressing   Problem: Clinical Measurements: Goal: Ability to maintain clinical measurements within normal limits will improve Outcome: Progressing Goal: Respiratory complications will improve Outcome: Progressing   Problem: Pain Managment: Goal: General experience of comfort will improve Outcome: Progressing   Problem: Safety: Goal: Ability to remain free from injury will improve Outcome: Progressing   Problem: Skin Integrity: Goal: Risk for impaired skin integrity will decrease Outcome: Progressing   

## 2020-01-29 NOTE — Progress Notes (Signed)
Transport at bedside to escort pt to dialysis. Pt refused to transport to dialysis via chair or bed. When RN asked pt why she did not want to go, pt stated "I just don't want to go". [place], RN in dialysis notified. Will continue to monitor.

## 2020-01-29 NOTE — TOC Progression Note (Addendum)
Transition of Care [facility]) - Progression Note    Patient Details  Name: Lisvet Rasheed MRN: [id number] Date of Birth: [birth year]  Transition of Care [facility] CM/SW [place], [place] Phone Number: 01/29/2020, 10:13 AM  Clinical Narrative:    10:50am- Per nephrology pt declined to sit in recliner today. This is needed for ongoing treatments. Continue to follow. If pt continues to refuse may need GOC to re-evaluate pt care plans.   10:13am- CSW following for toleration of dialysis in recliner to begin process for disposition to SNF with dialysis.    Expected Discharge Plan: [place] Barriers to Discharge: Continued Medical Work up, Other (comment)(ability to tolerate dialysis in recliner)  Expected Discharge Plan and Services Expected Discharge Plan: [place] In-house Referral: Clinical Social Work Discharge Planning Services: CM Consult Post Acute Care Choice: [place] Living arrangements for the past 2 months: [place]   Readmission Risk Interventions Readmission Risk Prevention Plan 01/28/2020  Transportation Screening Complete  PCP or Specialist Appt within 3-5 Days Not Complete  Not Complete comments plan for SNF  HRI or [place] Not Complete  HRI or Home Care Consult comments plan for SNF  Social Work Consult for [place] Planning/Counseling Complete  Palliative Care Screening Complete  Medication Review Press photographer) Referral to Pharmacy

## 2020-01-30 DIAGNOSIS — N179 Acute kidney failure, unspecified: Secondary | ICD-10-CM | POA: Diagnosis not present

## 2020-01-30 LAB — RENAL FUNCTION PANEL
Albumin: 2.5 g/dL — ABNORMAL LOW (ref 3.5–5.0)
Anion gap: 12 (ref 5–15)
BUN: 104 mg/dL — ABNORMAL HIGH (ref 8–23)
CO2: 28 mmol/L (ref 22–32)
Calcium: 8.6 mg/dL — ABNORMAL LOW (ref 8.9–10.3)
Chloride: 95 mmol/L — ABNORMAL LOW (ref 98–111)
Creatinine, Ser: 2.69 mg/dL — ABNORMAL HIGH (ref 0.44–1.00)
GFR calc Af Amer: 20 mL/min — ABNORMAL LOW (ref >60)
GFR calc non Af Amer: 17 mL/min — ABNORMAL LOW (ref >60)
Glucose, Bld: 113 mg/dL — ABNORMAL HIGH (ref 70–99)
Phosphorus: 6 mg/dL — ABNORMAL HIGH (ref 2.5–4.6)
Potassium: 3.9 mmol/L (ref 3.5–5.1)
Sodium: 135 mmol/L (ref 135–145)

## 2020-01-30 LAB — MAGNESIUM: Magnesium: 2.5 mg/dL — ABNORMAL HIGH (ref 1.7–2.4)

## 2020-01-30 LAB — GLUCOSE, CAPILLARY
Glucose-Capillary: 114 mg/dL — ABNORMAL HIGH (ref 70–99)
Glucose-Capillary: 126 mg/dL — ABNORMAL HIGH (ref 70–99)
Glucose-Capillary: 123 mg/dL — ABNORMAL HIGH (ref 70–99)
Glucose-Capillary: 116 mg/dL — ABNORMAL HIGH (ref 70–99)
Glucose-Capillary: 131 mg/dL — ABNORMAL HIGH (ref 70–99)

## 2020-01-30 NOTE — Progress Notes (Signed)
Patient ID: Bailey Cox, female   DOB: 1946-03-31, 74 y.o.   MRN: [id number] [facility] Progress Note   Assessment/ Plan:   1. Acute kidney Injury on chronic kidney disease stage IV: Suspected acute kidney injury is from sepsis/ATN.  Previously on CRRT beginning 3/7 and recently transitioned to intermittent hemodialysis.  She declined hemodialysis yesterday.  She remains oliguric and without any evidence of renal recovery at this time. 2.  Sepsis: Suspected to be of urinary source but appears to have now resolved status post antibiotic treatment. 3.  History of PEA arrest status post tracheostomy; decannulated on 3/19 and appears to be having decent oxygenation since then. 4.  Anemia: Secondary to chronic illness exacerbated by recent hospitalization-adequate iron stores and now on ESA. 5.  Disposition: At this point, it appears that dialysis may be a life-sustaining procedure and if she continues to refuse this (of which she has complete right to do) then hospice care will be the next option.  Subjective:   She refused hemodialysis yesterday "because she did not feel like getting it"-we discussed about the risks and benefits of dialysis.  She refuses dialysis today and "will think about it tomorrow".   Objective:   BP (!) 138/58 (BP Location: Left Arm)   Pulse 79   Temp 98.6 F (37 C) (Oral)   Resp 16   Ht '5\' 4"'$  (1.626 m)   Wt 88.5 kg   SpO2 100%   BMI 33.49 kg/m   Intake/Output Summary (Last 24 hours) at 01/30/2020 1022 Last data filed at 01/29/2020 1814 Gross per 24 hour  Intake --  Output 300 ml  Net -300 ml   Weight change:   Physical Exam: Gen: Appears comfortable resting in bed CVS: Pulse regular rhythm, normal rate, S1 and S2 normal Resp: Clear to auscultation bilaterally, right IJ TDC. Abd: Soft, obese, nontender Ext: Trace lower extremity edema  Imaging: No results found.  Labs: BMET Recent Labs  Lab 01/25/20 0354 01/25/20 1515  01/26/20 0436 01/27/20 0422 01/28/20 0425 01/29/20 0340 01/30/20 0431  NA 136 134* 134* 133* 134* 133* 135  K 3.7 4.2 4.1 4.3 3.7 3.7 3.9  CL 97* 94* 94* 95* 96* 95* 95*  CO2 '31 30 30 28 29 27 28  '$ GLUCOSE 122* 135* 127* 116* 154* 113* 113*  BUN 41* 53* 67* 97* 54* 82* 104*  CREATININE 1.66* 1.98* 2.53* 2.87* 1.86* 2.27* 2.69*  CALCIUM 8.4* 8.6* 8.7* 8.5* 8.5* 8.5* 8.6*  PHOS 3.4 4.7* 5.4* 6.0* 3.4 5.0* 6.0*   CBC Recent Labs  Lab 01/24/20 0441 01/25/20 0354 01/26/20 0436 01/26/20 0436 01/27/20 0422 01/27/20 0719 01/28/20 0425 01/29/20 0340  WBC 4.2  --  4.5  --   --  4.7  --  6.2  NEUTROABS  --   --  3.1  --   --   --   --   --   HGB 7.4*   < > 7.7*   < > 7.4* 7.7* 8.3* 8.2*  HCT 25.0*   < > 25.0*   < > 24.1* 25.4* 26.9* 27.2*  MCV 98.0  --  95.4  --   --  97.3  --  95.4  PLT 71*  --  90*  --   --  106*  --  130*   < > = values in this interval not displayed.    Medications:    . B-complex with vitamin C  1 tablet Per Tube Daily  . busPIRone  5  mg Oral BID  . chlorhexidine gluconate (MEDLINE KIT)  15 mL Mouth Rinse BID  . Chlorhexidine Gluconate Cloth  6 each Topical Daily  . Chlorhexidine Gluconate Cloth  6 each Topical Q0600  . darbepoetin (ARANESP) injection - DIALYSIS  100 mcg Intravenous Q Tue-HD  . feeding supplement (NEPRO CARB STEADY)  1,000 mL Oral Q24H  . feeding supplement (PRO-STAT SUGAR FREE 64)  30 mL Per Tube TID  . heparin  5,000 Units Subcutaneous Q8H  . insulin aspart  0-15 Units Subcutaneous Q4H  . mouth rinse  15 mL Mouth Rinse 10 times per day  . pantoprazole sodium  40 mg Per Tube Q24H  . PARoxetine  40 mg Per Tube Daily  . sodium chloride flush  10-40 mL Intracatheter Q12H  . traZODone  25 mg Per Tube QHS   Elmarie Shiley, MD 01/30/2020, 10:22 AM

## 2020-01-30 NOTE — Progress Notes (Signed)
PROGRESS NOTE    Bailey Cox  YIF:[id number] DOB: [birth year] DOA: 01/05/2020 PCP: Patient, No Pcp Per   Brief Narrative: Per chart review/previous attending:74 year old obese Caucasian female who was admitted on 01/05/2020 from [facility] where she had just arrived after a long admission to [facility] where she was hospitalized from 10/2019 up until 01/05/2020 where she suffered a PEA arrest during endoscopy and ended up requiring a tracheostomy. She presented to [facility] with widespread peripheral edema and acidosis.   Originally she was hospitalized in September 2020 with acute cholecystitis and cholecystectomy. 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 While hospitalized she developed fluid overload and ultimately transferred to [facility] for dialysis. Her clinical course after that point became somewhat unclear but family described that she was on a ventilator for several procedures and was eventually left intubated. 1 of those procedures and endoscopy which she unfortunately suffered a cardiac arrest. Again details of the cardiac arrest or not clear but she remained on the ventilator and ultimately underwent a tracheostomy. At that time her hospital course was also complicated by MRSA pneumonia. She is ultimately able to come off of hemodialysis and treated with intermittent diuresis. During her hospital stay she bounced in and out of the ICU for a few times with volume overload and at the end of her admission at [facility] she still struggled with kidney disease and was being treated for urinary tract infection with vancomycin and cefepime. Plans were stopped this on 3 2. She was discharged on the [facility] system on [facility] [place] upon arrival to [facility] they deemed her too sick for admission transferred her directly to [facility] emergency department with complaints of volume  overload and acidosis. Since then she has been in ICU and has been off of the ventilator and now on trach collar. She underwent CRRT from 3 9 until 312 and transferred to the floor on 3/14 after she was tolerating hemodialysis. Currently nephrology has been consulted and they feel that she has an AKI on CKD most likely cause of her AKI being from ATN from sepsis and family still wants aggressive measures so she was on CRRT from 3 7 until 311 for clearance and also for anasarca and volume management. Her CRRT was stopped on 3 11 and Dr. Moshe Cipro suspected that she will likely require renal replacement therapy in the setting of intermittent hemodialysis. They feel that if she has a trach and is dialysis dependent the only option would be send her to LTAC. The plan is for intermittent hemodialysis schedule on TTS here via [facility]. Placement is likely going to be an issue because of this.  PT OT recommending SNF at this time.  Patient with no days left for LTAC.  Subjective:  Seen this morning alert awake not in acute distress, tolerating nasal cannula-saturating well.. She declined hemodialysis yesterday remains oliguric.  PICC on RUE Mild leg edema b/l + Peg+ and TF is running w/ flush TRACH site with dressing intact and is healing   Assessment & Plan:  MRSA pneumonia: Patient has completed antibiotics.  Watch her closely, she is afebrile.  AKI on CKD stage IV: Initially with AKI/ATN from sepsis.  Family desire renal [place] therapy and patient on intermittent HD initially currently per nephrology on HD TTS- s/p HD on 01/22/2020, 01/24/2020, 01/27/2020. Per nephrology patient looks like she is dialysis dependent now.  Patient declined hemodialysis  yesterday, nephrology on board remains oliguric without evidence of renal recovery.  For dialysis today.  We will consult palliative care given that patient is refusing dialysis, if she continues to refuse she would benefit with hospice care.  Chronic  respiratory failure, with tracheostomy- dencannulated 3/19/ history of PEA arrest: Had tracheostomy following PEA arrest previously in 2020- was on vent, off ventilator since 2/8.  Seen by pulmonary, speech therapy RT-tolerated capping 3/18 and 19 and status post decannulation. cont SLP   T2DM, HbA1c stable 5.7 on sliding scale insulin.  Glucose is stable as below.  Monitor. Recent Labs  Lab 01/29/20 2001 01/29/20 2337 01/30/20 0453 01/30/20 0748 01/30/20 1213  GLUCAP 123* 132* 114* 126* 123*   Hypertension: Blood pressure fairly stable.   Anemia likely multifactorial, anemia of chronic kidney disease.  Continue Aranesp every Tuesday per nephrology. Recent Labs  Lab 01/26/20 0436 01/27/20 0422 01/27/20 0719 01/28/20 0425 01/29/20 0340  HGB 7.7* 7.4* 7.7* 8.3* 8.2*  HCT 25.0* 24.1* 25.4* 26.9* 27.2*   Hyponatremia: Resolved.  Anxiety disorder: On Paxil dose increased, and Xanax as needed and bedtime trazodone.  Patient is also back on BuSpar 5 mg  GERD-on PPI  GOC:She is full code.She had palliative care discussion while she was in [place] per daughter. I Spoke with daughter-We discussed about palliative care follow along for [place] discussion.Daughter reports she has not been up for few months and has been in and out of hospital multiple times.  She understands patient prognosis guarded.  We will continue to provide supportive care.  Given patient's refusal for dialysis yesterday will cont palliative care eval. And discussion.  Nutrition: Diet Order            Diet NPO time specified Except for: Ice Chips, Other (See Comments)  Diet effective now              Nutrition Problem: Inadequate oral intake Etiology: inability to eat Signs/Symptoms: NPO status Interventions: Tube feeding Body mass index is 33.49 kg/m.   DVT prophylaxis:Heparin Code Status: Full code Family Communication: plan of care discussed with patient.  I had updated her daughter Katharine Look over the  phone. Disposition Plan: Patient came from: [facility]                                                                                                                         Anticipated d/c place:SNF Barriers to d/c OR conditions which need to be met to effect a safe d/c: Patient was in the ICU for 14 days status post tracheostomy required intermittent dialysis.  Patient has been refused for [facility] as well as select specialty LTAC.  Likely to skilled nursing facility now patient has been decannulated-refused dialysis 3/25-reattempt dialysis today.  Await palliative care evaluation.   Consultants:  Nephrology: Dr. Augustin Coupe 01/06/2020  Wound care  Entered interventional radiology  Procedures:see note PICC pre admit > Trach pre admit > 3/5 IJ vas cath->  Left upper extremity  Doppler negative for DVT 01/19/2020 Echo 10/24/2019>LVEF 55%, Grade 1 DD. RV systolic function normal.  Echo 3/2: LVEF 65-70%, grade II dysfunction. RVSP.  Microbiology:see note Blood 3/1 >> ng Urine 3/1 >> ng resp 3/4 >>pan-sensitivepseudomonas   Medications: Scheduled Meds:  B-complex with vitamin C  1 tablet Per Tube Daily   busPIRone  5 mg Oral BID   chlorhexidine gluconate (MEDLINE KIT)  15 mL Mouth Rinse BID   Chlorhexidine Gluconate Cloth  6 each Topical Daily   Chlorhexidine Gluconate Cloth  6 each Topical Q0600   darbepoetin (ARANESP) injection - DIALYSIS  100 mcg Intravenous Q Tue-HD   feeding supplement (NEPRO CARB STEADY)  1,000 mL Oral Q24H   feeding supplement (PRO-STAT SUGAR FREE 64)  30 mL Per Tube TID   heparin  5,000 Units Subcutaneous Q8H   insulin aspart  0-15 Units Subcutaneous Q4H   mouth rinse  15 mL Mouth Rinse 10 times per day   pantoprazole sodium  40 mg Per Tube Q24H   PARoxetine  40 mg Per Tube Daily   sodium chloride flush  10-40 mL Intracatheter Q12H   traZODone  25 mg Per Tube QHS   Continuous Infusions:  sodium chloride       Antimicrobials: Anti-infectives (From admission, onward)   Start     Dose/Rate Route Frequency Ordered Stop   01/10/20 0930  vancomycin (VANCOCIN) IVPB 1000 mg/200 mL premix  Status:  Discontinued     1,000 mg 200 mL/hr over 60 Minutes Intravenous  Once 01/10/20 0915 01/13/20 0623   01/06/20 2200  ceFEPIme (MAXIPIME) 2 g in sodium chloride 0.9 % 100 mL IVPB     2 g 200 mL/hr over 30 Minutes Intravenous Every 24 hours 01/05/20 2251 01/11/20 2234   01/05/20 2200  ceFEPIme (MAXIPIME) 2 g in sodium chloride 0.9 % 100 mL IVPB     2 g 200 mL/hr over 30 Minutes Intravenous  Once 01/05/20 2148 01/06/20 0015   01/05/20 2200  metroNIDAZOLE (FLAGYL) IVPB 500 mg     500 mg 100 mL/hr over 60 Minutes Intravenous  Once 01/05/20 2148 01/06/20 0015   01/05/20 2200  vancomycin (VANCOCIN) IVPB 1000 mg/200 mL premix  Status:  Discontinued     1,000 mg 200 mL/hr over 60 Minutes Intravenous  Once 01/05/20 2148 01/05/20 2151   01/05/20 2200  vancomycin (VANCOREADY) IVPB 2000 mg/400 mL  Status:  Discontinued     2,000 mg 200 mL/hr over 120 Minutes Intravenous  Once 01/05/20 2151 01/05/20 2223       Objective: Vitals: Today's Vitals   01/29/20 1959 01/29/20 2110 01/30/20 0025 01/30/20 0452  BP: (!) 156/70   (!) 138/58  Pulse: 79   79  Resp: 18   16  Temp: 98.4 F (36.9 C)   98.6 F (37 C)  TempSrc: Oral   Oral  SpO2: 100%   100%  Weight:      Height:      PainSc:  0-No pain 0-No pain     Intake/Output Summary (Last 24 hours) at 01/30/2020 1336 Last data filed at 01/29/2020 1814 Gross per 24 hour  Intake --  Output 300 ml  Net -300 ml   Filed Weights   01/24/20 1530 01/27/20 0500 01/27/20 0655  Weight: 91.4 kg 91.1 kg 88.5 kg   Weight change:    Intake/Output from previous day: 03/25 0701 - 03/26 0700 In: -  Out: 300 [Urine:300] Intake/Output this shift: No intake/output data recorded.  Examination:  General  exam: Weak frail, alert awake.   HEENT:Oral mucosa moist, Ear/Nose  WNL grossly,dentition normal.  Trach site decannulated with wound healing.   Respiratory system: bilaterally clear,no use of accessory muscle, non tender. Cardiovascular system: S1 & S2 +, regular, No JVD. Gastrointestinal system: Abdomen soft, NT,ND, BS+. PEG  + Nervous System:Alert, awake, moving extremities and grossly nonfocal Extremities: No edema, distal peripheral pulses palpable.  Skin: No rashes,no icterus. MSK: Normal muscle bulk,tone, power PICC present in right upper extremity HD catheter on rt chest  Data Reviewed: I have personally reviewed following labs and imaging studies CBC: Recent Labs  Lab 01/24/20 0441 01/25/20 0354 01/26/20 0436 01/27/20 0422 01/27/20 0719 01/28/20 0425 01/29/20 0340  WBC 4.2  --  4.5  --  4.7  --  6.2  NEUTROABS  --   --  3.1  --   --   --   --   HGB 7.4*   < > 7.7* 7.4* 7.7* 8.3* 8.2*  HCT 25.0*   < > 25.0* 24.1* 25.4* 26.9* 27.2*  MCV 98.0  --  95.4  --  97.3  --  95.4  PLT 71*  --  90*  --  106*  --  130*   < > = values in this interval not displayed.   Basic Metabolic Panel: Recent Labs  Lab 01/26/20 0436 01/27/20 0422 01/28/20 0425 01/29/20 0340 01/30/20 0431  NA 134* 133* 134* 133* 135  K 4.1 4.3 3.7 3.7 3.9  CL 94* 95* 96* 95* 95*  CO2 _0 GLUCOSE 127* 116* 154* 113* 113*  BUN 67* 97* 54* 82* 104*  CREATININE 2.53* 2.87* 1.86* 2.27* 2.69*  CALCIUM 8.7* 8.5* 8.5* 8.5* 8.6*  MG 2.6* 2.8* 2.4 2.6* 2.5*  PHOS 5.4* 6.0* 3.4 5.0* 6.0*   GFR: Estimated Creatinine Clearance: 20.7 mL/min (A) (by C-G formula based on SCr of 2.69 mg/dL (H)). Liver Function Tests: Recent Labs  Lab 01/26/20 0436 01/27/20 0422 01/28/20 0425 01/29/20 0340 01/30/20 0431  ALBUMIN 2.4* 2.5* 2.6* 2.5* 2.5*   No results for input(s): LIPASE, AMYLASE in the last 168 hours. No results for input(s): AMMONIA in the last 168 hours. Coagulation Profile: No results for input(s): INR, PROTIME in the last 168 hours. Cardiac Enzymes: No  results for input(s): CKTOTAL, CKMB, CKMBINDEX, TROPONINI in the last 168 hours. BNP (last 3 results) No results for input(s): PROBNP in the last 8760 hours. HbA1C: No results for input(s): HGBA1C in the last 72 hours. CBG: Recent Labs  Lab 01/29/20 2001 01/29/20 2337 01/30/20 0453 01/30/20 0748 01/30/20 1213  GLUCAP 123* 132* 114* 126* 123*   Lipid Profile: No results for input(s): CHOL, HDL, LDLCALC, TRIG, CHOLHDL, LDLDIRECT in the last 72 hours. Thyroid Function Tests: No results for input(s): TSH, T4TOTAL, FREET4, T3FREE, THYROIDAB in the last 72 hours. Anemia Panel: No results for input(s): VITAMINB12, FOLATE, FERRITIN, TIBC, IRON, RETICCTPCT in the last 72 hours. Sepsis Labs: No results for input(s): PROCALCITON, LATICACIDVEN in the last 168 hours.  No results found for this or any previous visit (from the past 240 hour(s)).    Radiology Studies: No results found.   LOS: 25 days   Time spent: More than 50% of that time was spent in counseling and/or coordination of care.  Antonieta Pert, MD [facility]  01/30/2020, 1:36 PM

## 2020-01-30 NOTE — Progress Notes (Signed)
Renal Navigator following along and will refer patient for OP HD if she is agreeable and able to tolerate HD seated in recliner. Renal Navigator has been in communication with Nephrology, CSW and PMT.   Alphonzo Cruise, [place] Renal Navigator [phone]

## 2020-01-30 NOTE — Care Management Important Message (Signed)
Important Message  Patient Details  Name: Bailey Cox MRN: [id number] Date of Birth: 17-Jan-1946   Medicare Important Message Given:  Yes     Memory Argue 01/30/2020, 9:23 AM

## 2020-01-30 NOTE — Progress Notes (Signed)
When transporter came up to take pt to HD, she stated she did not want to go.  I called the dtr, Katharine Look and she spoke to her mom and tried to talk her mom into going to HD today.  Pt clearly stated that "she felt bad" and did not want to go today.  Dr. Maren Beach made aware.  I spoke with the dtr and she stated that it was difficult to have her mom here in [place] and them being up in [place].

## 2020-01-31 DIAGNOSIS — N179 Acute kidney failure, unspecified: Secondary | ICD-10-CM | POA: Diagnosis not present

## 2020-01-31 DIAGNOSIS — N189 Chronic kidney disease, unspecified: Secondary | ICD-10-CM | POA: Diagnosis not present

## 2020-01-31 LAB — RENAL FUNCTION PANEL
Albumin: 2.5 g/dL — ABNORMAL LOW (ref 3.5–5.0)
Anion gap: 10 (ref 5–15)
BUN: 118 mg/dL — ABNORMAL HIGH (ref 8–23)
CO2: 29 mmol/L (ref 22–32)
Calcium: 8.5 mg/dL — ABNORMAL LOW (ref 8.9–10.3)
Chloride: 96 mmol/L — ABNORMAL LOW (ref 98–111)
Creatinine, Ser: 3.03 mg/dL — ABNORMAL HIGH (ref 0.44–1.00)
GFR calc Af Amer: 17 mL/min — ABNORMAL LOW (ref >60)
GFR calc non Af Amer: 15 mL/min — ABNORMAL LOW (ref >60)
Glucose, Bld: 130 mg/dL — ABNORMAL HIGH (ref 70–99)
Phosphorus: 6.3 mg/dL — ABNORMAL HIGH (ref 2.5–4.6)
Potassium: 4 mmol/L (ref 3.5–5.1)
Sodium: 135 mmol/L (ref 135–145)

## 2020-01-31 LAB — GLUCOSE, CAPILLARY
Glucose-Capillary: 114 mg/dL — ABNORMAL HIGH (ref 70–99)
Glucose-Capillary: 120 mg/dL — ABNORMAL HIGH (ref 70–99)
Glucose-Capillary: 125 mg/dL — ABNORMAL HIGH (ref 70–99)
Glucose-Capillary: 125 mg/dL — ABNORMAL HIGH (ref 70–99)
Glucose-Capillary: 125 mg/dL — ABNORMAL HIGH (ref 70–99)
Glucose-Capillary: 126 mg/dL — ABNORMAL HIGH (ref 70–99)
Glucose-Capillary: 145 mg/dL — ABNORMAL HIGH (ref 70–99)

## 2020-01-31 LAB — MAGNESIUM: Magnesium: 2.8 mg/dL — ABNORMAL HIGH (ref 1.7–2.4)

## 2020-01-31 NOTE — Progress Notes (Signed)
PROGRESS NOTE    Bailey Cox  EGB:[id number] DOB: 01/11/1946 DOA: 01/05/2020 PCP: Patient, No Pcp Per   Brief Narrative: Per chart review/previous attending:74 year old obese Caucasian female who was admitted on 01/05/2020 from [facility] where she had just arrived after a long admission to [facility] where she was hospitalized from 10/2019 up until 01/05/2020 where she suffered a PEA arrest during endoscopy and ended up requiring a tracheostomy. She presented to [facility] with widespread peripheral edema and acidosis.   Originally she was hospitalized in September 2020 with acute cholecystitis and cholecystectomy. 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 While hospitalized she developed fluid overload and ultimately transferred to [facility] for dialysis. Her clinical course after that point became somewhat unclear but family described that she was on a ventilator for several procedures and was eventually left intubated. 1 of those procedures and endoscopy which she unfortunately suffered a cardiac arrest. Again details of the cardiac arrest or not clear but she remained on the ventilator and ultimately underwent a tracheostomy. At that time her hospital course was also complicated by MRSA pneumonia. She is ultimately able to come off of hemodialysis and treated with intermittent diuresis. During her hospital stay she bounced in and out of the ICU for a few times with volume overload and at the end of her admission at [facility] she still struggled with kidney disease and was being treated for urinary tract infection with vancomycin and cefepime. Plans were stopped this on 3 2. She was discharged on the [facility] system on [facility] in [place] upon arrival to [facility] they deemed her too sick for admission transferred her directly to [facility] emergency department with complaints of volume  overload and acidosis. Since then she has been in ICU and has been off of the ventilator and now on trach collar. She underwent CRRT from 3 9 until 312 and transferred to the floor on 3/14 after she was tolerating hemodialysis. Currently nephrology has been consulted and they feel that she has an AKI on CKD most likely cause of her AKI being from ATN from sepsis and family still wants aggressive measures so she was on CRRT from 3 7 until 311 for clearance and also for anasarca and volume management. Her CRRT was stopped on 3 11 and Dr. Moshe Cipro suspected that she will likely require renal replacement therapy in the setting of intermittent hemodialysis. They feel that if she has a trach and is dialysis dependent the only option would be send her to LTAC. The plan is for intermittent hemodialysis schedule on TTS here via [facility]. Placement is likely going to be an issue because of this.  Subjective:  Seen this morning. Alert awake interactive and refused dialysis again Had extensive discussion with HER. Reports she does not want to die-but he states she does not have dialysis again today and wants to think about it. Potassium and bicarb remains a stable.  BUN/Creat uptrending.  Peg+ and TF is running w/ flush TRACH site with dressing intact and is healing   Assessment & Plan:  MRSA pneumonia: Patient has completed antibiotics.  Overall she is afebrile.  AKI on CKD stage IV: Initially with AKI/ATN from sepsis.  Family desire renal Rissman therapy and patient on intermittent HD initially currently per nephrology on HD TTS- s/p HD on 01/22/2020, 01/24/2020, 01/27/2020. Per nephrology patient looks like she is dialysis dependent now.  Patient declined  hemodialysis 3/25, 26 and again this morning despite extensive counseling.  Family has been informed.  We will consult psychiatry as well for competency evaluation.  If patient declines dialysis, she may benefit with hospice palliative care evaluation  and I have brought this up to the patient.  Nephrology team has also reached out to patient/family.    Chronic respiratory failure, with tracheostomy- dencannulated 3/19/ history of PEA arrest: Had tracheostomy following PEA arrest previously in 2020- was on vent, off ventilator since 2/8.  Seen by pulmonary, speech therapy RT-tolerated capping 3/18 and 19 and status post decannulation. cont SLP   T2DM, HbA1c stable 5.7 on sliding scale insulin.  Glucose is stable as below.  Monitor. Recent Labs  Lab 01/30/20 1632 01/30/20 2005 01/31/20 0032 01/31/20 0437 01/31/20 0811  GLUCAP 116* 131* 125* 126* 125*   Hypertension: Blood pressure fairly stable.   Anemia likely multifactorial, anemia of chronic kidney disease.  Continue Aranesp every Tuesday per nephrology. Recent Labs  Lab 01/26/20 0436 01/27/20 0422 01/27/20 0719 01/28/20 0425 01/29/20 0340  HGB 7.7* 7.4* 7.7* 8.3* 8.2*  HCT 25.0* 24.1* 25.4* 26.9* 27.2*   Hyponatremia: Resolved.  Anxiety disorder: Continue Paxil and BuSpar trazodone. Consult psych.  GERD-on PPI  GOC:She is full code.She had palliative care discussion while she was in [place] per daughter. I Spoke with daughter-We discussed about palliative care follow along for [facility] discussion.Daughter reports she has not been up for few months and has been in and out of hospital multiple times.  She understands patient prognosis guarded.  We will continue to provide supportive care.  At this time patient is refusing dialysis difficult situation psychiatry consulted also palliative care has been consulted.    Nutrition: Diet Order            Diet NPO time specified Except for: Ice Chips, Other (See Comments)  Diet effective now              Nutrition Problem: Inadequate oral intake Etiology: inability to eat Signs/Symptoms: NPO status Interventions: Tube feeding Body mass index is 33.49 kg/m.   DVT prophylaxis:Heparin Code Status: Full code Family  Communication: plan of care discussed with patient.  I had updated her daughter Katharine Look over the phone previously and multiple staffs had also updated her as pt is refusing HD  Disposition Plan: Patient came from: [facility]                                                                                                                         Anticipated d/c place:SNF Barriers to d/c OR conditions which need to be met to effect a safe d/c: Patient was in the ICU for 14 days status post tracheostomy required intermittent dialysis.  Patient has been refused for [facility] as well as select specialty LTAC.  Likely to skilled nursing facility now patient has been decannulated-refusing dialysis, psych and palliative care consulted.  Difficult dispo  Consultants:  Nephrology: Dr. Augustin Coupe 01/06/2020  Wound care  Entered interventional radiology  Procedures:see note PICC pre admit > Trach pre admit > 3/5 IJ vas cath->  Left upper extremity Doppler negative for DVT 01/19/2020 Echo 10/24/2019>LVEF 55%, Grade 1 DD. RV systolic function normal.  Echo 3/2: LVEF 65-70%, grade II dysfunction. RVSP.  Microbiology:see note Blood 3/1 >> ng Urine 3/1 >> ng resp 3/4 >>pan-sensitivepseudomonas   Medications: Scheduled Meds: . B-complex with vitamin C  1 tablet Per Tube Daily  . busPIRone  5 mg Oral BID  . chlorhexidine gluconate (MEDLINE KIT)  15 mL Mouth Rinse BID  . Chlorhexidine Gluconate Cloth  6 each Topical Daily  . Chlorhexidine Gluconate Cloth  6 each Topical Q0600  . darbepoetin (ARANESP) injection - DIALYSIS  100 mcg Intravenous Q Tue-HD  . feeding supplement (NEPRO CARB STEADY)  1,000 mL Oral Q24H  . feeding supplement (PRO-STAT SUGAR FREE 64)  30 mL Per Tube TID  . heparin  5,000 Units Subcutaneous Q8H  . insulin aspart  0-15 Units Subcutaneous Q4H  . mouth rinse  15 mL Mouth Rinse 10 times per day  . pantoprazole sodium  40 mg Per Tube Q24H  . PARoxetine  40 mg Per Tube Daily  .  sodium chloride flush  10-40 mL Intracatheter Q12H  . traZODone  25 mg Per Tube QHS   Continuous Infusions: . sodium chloride      Antimicrobials: Anti-infectives (From admission, onward)   Start     Dose/Rate Route Frequency Ordered Stop   01/10/20 0930  vancomycin (VANCOCIN) IVPB 1000 mg/200 mL premix  Status:  Discontinued     1,000 mg 200 mL/hr over 60 Minutes Intravenous  Once 01/10/20 0915 01/13/20 0623   01/06/20 2200  ceFEPIme (MAXIPIME) 2 g in sodium chloride 0.9 % 100 mL IVPB     2 g 200 mL/hr over 30 Minutes Intravenous Every 24 hours 01/05/20 2251 01/11/20 2234   01/05/20 2200  ceFEPIme (MAXIPIME) 2 g in sodium chloride 0.9 % 100 mL IVPB     2 g 200 mL/hr over 30 Minutes Intravenous  Once 01/05/20 2148 01/06/20 0015   01/05/20 2200  metroNIDAZOLE (FLAGYL) IVPB 500 mg     500 mg 100 mL/hr over 60 Minutes Intravenous  Once 01/05/20 2148 01/06/20 0015   01/05/20 2200  vancomycin (VANCOCIN) IVPB 1000 mg/200 mL premix  Status:  Discontinued     1,000 mg 200 mL/hr over 60 Minutes Intravenous  Once 01/05/20 2148 01/05/20 2151   01/05/20 2200  vancomycin (VANCOREADY) IVPB 2000 mg/400 mL  Status:  Discontinued     2,000 mg 200 mL/hr over 120 Minutes Intravenous  Once 01/05/20 2151 01/05/20 2223       Objective: Vitals: Today's Vitals   01/30/20 2226 01/30/20 2236 01/31/20 0501 01/31/20 0759  BP:  (!) 149/52 (!) 143/50   Pulse:  82 75   Resp:  20 20   Temp:  98.6 F (37 C) 98.3 F (36.8 C)   TempSrc:  Oral Oral   SpO2:  98% 98%   Weight:      Height:      PainSc: Asleep   0-No pain    Intake/Output Summary (Last 24 hours) at 01/31/2020 1202 Last data filed at 01/31/2020 0738 Gross per 24 hour  Intake 506 ml  Output 100 ml  Net 406 ml   Filed Weights   01/24/20 1530 01/27/20 0500 01/27/20 0655  Weight: 91.4 kg 91.1 kg 88.5 kg   Weight change:    Intake/Output  from previous day: 03/26 0701 - 03/27 0700 In: 200 [NG/GT:200] Out: 100  [Urine:100] Intake/Output this shift: Total I/O In: 306 [NG/GT:306] Out: -   Examination:  General exam: Alert awake weak, oriented to place people. HEENT:Oral mucosa moist, Ear/Nose WNL grossly,dentition normal.  Trach site decannulated with wound healing.   Respiratory system: bilaterally clear,no use of accessory muscle, non tender. Cardiovascular system: S1 & S2 +, regular, No JVD. Gastrointestinal system: Abdomen soft, NT,ND, BS+. PEG  + Nervous System:Alert, awake, moving extremities and grossly nonfocal Extremities: No edema, distal peripheral pulses palpable.  Skin: No rashes,no icterus. MSK: Normal muscle bulk,tone, power HD catheter on rt chest  Data Reviewed: I have personally reviewed following labs and imaging studies CBC: Recent Labs  Lab 01/26/20 0436 01/27/20 0422 01/27/20 0719 01/28/20 0425 01/29/20 0340  WBC 4.5  --  4.7  --  6.2  NEUTROABS 3.1  --   --   --   --   HGB 7.7* 7.4* 7.7* 8.3* 8.2*  HCT 25.0* 24.1* 25.4* 26.9* 27.2*  MCV 95.4  --  97.3  --  95.4  PLT 90*  --  106*  --  761*   Basic Metabolic Panel: Recent Labs  Lab 01/27/20 0422 01/28/20 0425 01/29/20 0340 01/30/20 0431 01/31/20 0432  NA 133* 134* 133* 135 135  K 4.3 3.7 3.7 3.9 4.0  CL 95* 96* 95* 95* 96*  CO2 '28 29 27 28 29  '$ GLUCOSE 116* 154* 113* 113* 130*  BUN 97* 54* 82* 104* 118*  CREATININE 2.87* 1.86* 2.27* 2.69* 3.03*  CALCIUM 8.5* 8.5* 8.5* 8.6* 8.5*  MG 2.8* 2.4 2.6* 2.5* 2.8*  PHOS 6.0* 3.4 5.0* 6.0* 6.3*   GFR: Estimated Creatinine Clearance: 18.3 mL/min (A) (by C-G formula based on SCr of 3.03 mg/dL (H)). Liver Function Tests: Recent Labs  Lab 01/27/20 0422 01/28/20 0425 01/29/20 0340 01/30/20 0431 01/31/20 0432  ALBUMIN 2.5* 2.6* 2.5* 2.5* 2.5*   No results for input(s): LIPASE, AMYLASE in the last 168 hours. No results for input(s): AMMONIA in the last 168 hours. Coagulation Profile: No results for input(s): INR, PROTIME in the last 168 hours. Cardiac  Enzymes: No results for input(s): CKTOTAL, CKMB, CKMBINDEX, TROPONINI in the last 168 hours. BNP (last 3 results) No results for input(s): PROBNP in the last 8760 hours. HbA1C: No results for input(s): HGBA1C in the last 72 hours. CBG: Recent Labs  Lab 01/30/20 1632 01/30/20 2005 01/31/20 0032 01/31/20 0437 01/31/20 0811  GLUCAP 116* 131* 125* 126* 125*   Lipid Profile: No results for input(s): CHOL, HDL, LDLCALC, TRIG, CHOLHDL, LDLDIRECT in the last 72 hours. Thyroid Function Tests: No results for input(s): TSH, T4TOTAL, FREET4, T3FREE, THYROIDAB in the last 72 hours. Anemia Panel: No results for input(s): VITAMINB12, FOLATE, FERRITIN, TIBC, IRON, RETICCTPCT in the last 72 hours. Sepsis Labs: No results for input(s): PROCALCITON, LATICACIDVEN in the last 168 hours.  No results found for this or any previous visit (from the past 240 hour(s)).    Radiology Studies: No results found.   LOS: 26 days   Time spent: More than 50% of that time was spent in counseling and/or coordination of care.  Antonieta Pert, MD [facility]  01/31/2020, 12:02 PM

## 2020-01-31 NOTE — Progress Notes (Signed)
Pt refused dialysis again this morning. Explained to patient the importance of hemodialysis.B. Lennox Grumbles, NP on call also spoke to the patient over the phone. Pt still refuses at this time. NP reached out to the pt's Daughter on the phone. Will continue to monitor pt.

## 2020-01-31 NOTE — Progress Notes (Signed)
Patient ID: Jenesa Foresta, female   DOB: [birth year], 74 y.o.   MRN: [id number]  [facility] Progress Note   Assessment/ Plan:   1. Acute kidney Injury on chronic kidney disease stage IV: Suspected acute kidney injury is from sepsis/ATN.  Previously on CRRT beginning 3/7 and later transitioned to intermittent hemodialysis after hemodynamic stability established.  She remains oliguric with worsening labs noted but without any critical electrolyte abnormality.  She has refused dialysis yet again today. 2.  Sepsis: Suspected to be of urinary source but appears to have now resolved status post antibiotic treatment. 3.  History of PEA arrest status post tracheostomy; decannulated on 3/19 and appears to be having decent oxygenation since then. 4.  Anemia: Secondary to chronic illness exacerbated by recent hospitalization-adequate iron stores and now on ESA. 5.  Disposition: Difficult situation, she continues to refuse dialysis and insists that "she does not want to die".  Will likely need assistance from case management/palliative care service and possibly psychiatry consult to establish competency and management.  Subjective:   Refused dialysis yesterday and yet again today.  I have spoken to her daughter Madlyn Frankel ([phone]) and updated her of this.   Objective:   BP (!) 143/50 (BP Location: Left Leg)   Pulse 75   Temp 98.3 F (36.8 C) (Oral)   Resp 20   Ht '5\' 4"'$  (1.626 m)   Wt 88.5 kg   SpO2 98%   BMI 33.49 kg/m   Intake/Output Summary (Last 24 hours) at 01/31/2020 1111 Last data filed at 01/31/2020 0738 Gross per 24 hour  Intake 506 ml  Output 100 ml  Net 406 ml   Weight change:   Physical Exam: Gen: Appears comfortable resting in bed CVS: Pulse regular rhythm, normal rate, S1 and S2 normal Resp: Clear to auscultation bilaterally, right IJ TDC. Abd: Soft, obese, nontender Ext: Trace lower extremity edema  Imaging: No results found.  Labs: BMET Recent  Labs  Lab 01/25/20 1515 01/26/20 0436 01/27/20 0422 01/28/20 0425 01/29/20 0340 01/30/20 0431 01/31/20 0432  NA 134* 134* 133* 134* 133* 135 135  K 4.2 4.1 4.3 3.7 3.7 3.9 4.0  CL 94* 94* 95* 96* 95* 95* 96*  CO2 '30 30 28 29 27 28 29  '$ GLUCOSE 135* 127* 116* 154* 113* 113* 130*  BUN 53* 67* 97* 54* 82* 104* 118*  CREATININE 1.98* 2.53* 2.87* 1.86* 2.27* 2.69* 3.03*  CALCIUM 8.6* 8.7* 8.5* 8.5* 8.5* 8.6* 8.5*  PHOS 4.7* 5.4* 6.0* 3.4 5.0* 6.0* 6.3*   CBC Recent Labs  Lab 01/26/20 0436 01/26/20 0436 01/27/20 0422 01/27/20 0719 01/28/20 0425 01/29/20 0340  WBC 4.5  --   --  4.7  --  6.2  NEUTROABS 3.1  --   --   --   --   --   HGB 7.7*   < > 7.4* 7.7* 8.3* 8.2*  HCT 25.0*   < > 24.1* 25.4* 26.9* 27.2*  MCV 95.4  --   --  97.3  --  95.4  PLT 90*  --   --  106*  --  130*   < > = values in this interval not displayed.    Medications:    . B-complex with vitamin C  1 tablet Per Tube Daily  . busPIRone  5 mg Oral BID  . chlorhexidine gluconate (MEDLINE KIT)  15 mL Mouth Rinse BID  . Chlorhexidine Gluconate Cloth  6 each Topical Daily  . Chlorhexidine Gluconate Cloth  6 each  Topical Q0600  . darbepoetin (ARANESP) injection - DIALYSIS  100 mcg Intravenous Q Tue-HD  . feeding supplement (NEPRO CARB STEADY)  1,000 mL Oral Q24H  . feeding supplement (PRO-STAT SUGAR FREE 64)  30 mL Per Tube TID  . heparin  5,000 Units Subcutaneous Q8H  . insulin aspart  0-15 Units Subcutaneous Q4H  . mouth rinse  15 mL Mouth Rinse 10 times per day  . pantoprazole sodium  40 mg Per Tube Q24H  . PARoxetine  40 mg Per Tube Daily  . sodium chloride flush  10-40 mL Intracatheter Q12H  . traZODone  25 mg Per Tube QHS   Elmarie Shiley, MD 01/31/2020, 11:11 AM

## 2020-02-01 DIAGNOSIS — N179 Acute kidney failure, unspecified: Secondary | ICD-10-CM | POA: Diagnosis not present

## 2020-02-01 LAB — RENAL FUNCTION PANEL
Albumin: 2.5 g/dL — ABNORMAL LOW (ref 3.5–5.0)
Anion gap: 12 (ref 5–15)
BUN: 145 mg/dL — ABNORMAL HIGH (ref 8–23)
CO2: 28 mmol/L (ref 22–32)
Calcium: 8.6 mg/dL — ABNORMAL LOW (ref 8.9–10.3)
Chloride: 94 mmol/L — ABNORMAL LOW (ref 98–111)
Creatinine, Ser: 3.18 mg/dL — ABNORMAL HIGH (ref 0.44–1.00)
GFR calc Af Amer: 16 mL/min — ABNORMAL LOW (ref >60)
GFR calc non Af Amer: 14 mL/min — ABNORMAL LOW (ref >60)
Glucose, Bld: 131 mg/dL — ABNORMAL HIGH (ref 70–99)
Phosphorus: 6.9 mg/dL — ABNORMAL HIGH (ref 2.5–4.6)
Potassium: 4.1 mmol/L (ref 3.5–5.1)
Sodium: 134 mmol/L — ABNORMAL LOW (ref 135–145)

## 2020-02-01 LAB — GLUCOSE, CAPILLARY
Glucose-Capillary: 120 mg/dL — ABNORMAL HIGH (ref 70–99)
Glucose-Capillary: 121 mg/dL — ABNORMAL HIGH (ref 70–99)
Glucose-Capillary: 127 mg/dL — ABNORMAL HIGH (ref 70–99)
Glucose-Capillary: 128 mg/dL — ABNORMAL HIGH (ref 70–99)
Glucose-Capillary: 135 mg/dL — ABNORMAL HIGH (ref 70–99)
Glucose-Capillary: 149 mg/dL — ABNORMAL HIGH (ref 70–99)

## 2020-02-01 LAB — MAGNESIUM: Magnesium: 2.9 mg/dL — ABNORMAL HIGH (ref 1.7–2.4)

## 2020-02-01 NOTE — Progress Notes (Signed)
Patient ID: Bailey Cox, female   DOB: 1946-04-20, 74 y.o.   MRN: [id number] [facility] Progress Note   Assessment/ Plan:   1. Acute kidney Injury on chronic kidney disease stage IV: Suspected acute kidney injury is from sepsis/ATN.  Previously on CRRT beginning 3/7 and later transitioned to intermittent hemodialysis after hemodynamic stability established.  She is nonoliguric and with worsening azotemia on labs seen along with slow rise of creatinine.  She has refused hemodialysis for the last 3 days her daughter is coming in today to help facilitate discussion/goals of care.  Awaiting psychiatry evaluation for competency evaluation. 2.  Sepsis: Suspected to be of urinary source but appears to have now resolved status post antibiotic treatment. 3.  History of PEA arrest status post tracheostomy; decannulated on 3/19 and appears to be having decent oxygenation since then. 4.  Anemia: Secondary to chronic illness exacerbated by recent hospitalization-adequate iron stores and now on ESA. 5.  Disposition: Difficult situation, she continues to refuse dialysis and insists that "she does not want to die".  Awaiting psychiatry consult to establish competency and further management.  Subjective:   She refused dialysis yesterday in spite of extensive discussion.  She reports "pain all over" today.   Objective:   BP 123/84 (BP Location: Left Arm)   Pulse 98   Temp 97.8 F (36.6 C) (Oral)   Resp 18   Ht 5' 4" (1.626 m)   Wt 86.2 kg   SpO2 99%   BMI 32.62 kg/m   Intake/Output Summary (Last 24 hours) at 02/01/2020 1017 Last data filed at 02/01/2020 0900 Gross per 24 hour  Intake 906 ml  Output 550 ml  Net 356 ml   Weight change:   Physical Exam: Gen: Appears comfortable resting in bed CVS: Pulse regular rhythm, normal rate, S1 and S2 normal.  Tracheostomy site intact dressing. Resp: Clear to auscultation bilaterally, right IJ TDC. Abd: Soft, obese, nontender Ext: Trace -  1+ lower extremity edema  Imaging: No results found.  Labs: BMET Recent Labs  Lab 01/26/20 0436 01/27/20 0422 01/28/20 0425 01/29/20 0340 01/30/20 0431 01/31/20 0432 02/01/20 0440  NA 134* 133* 134* 133* 135 135 134*  K 4.1 4.3 3.7 3.7 3.9 4.0 4.1  CL 94* 95* 96* 95* 95* 96* 94*  CO2 _0 GLUCOSE 127* 116* 154* 113* 113* 130* 131*  BUN 67* 97* 54* 82* 104* 118* 145*  CREATININE 2.53* 2.87* 1.86* 2.27* 2.69* 3.03* 3.18*  CALCIUM 8.7* 8.5* 8.5* 8.5* 8.6* 8.5* 8.6*  PHOS 5.4* 6.0* 3.4 5.0* 6.0* 6.3* 6.9*   CBC Recent Labs  Lab 01/26/20 0436 01/26/20 0436 01/27/20 0422 01/27/20 0719 01/28/20 0425 01/29/20 0340  WBC 4.5  --   --  4.7  --  6.2  NEUTROABS 3.1  --   --   --   --   --   HGB 7.7*   < > 7.4* 7.7* 8.3* 8.2*  HCT 25.0*   < > 24.1* 25.4* 26.9* 27.2*  MCV 95.4  --   --  97.3  --  95.4  PLT 90*  --   --  106*  --  130*   < > = values in this interval not displayed.    Medications:    . B-complex with vitamin C  1 tablet Per Tube Daily  . busPIRone  5 mg Oral BID  . chlorhexidine gluconate (MEDLINE KIT)  15 mL Mouth Rinse BID  . Chlorhexidine  Gluconate Cloth  6 each Topical Daily  . Chlorhexidine Gluconate Cloth  6 each Topical Q0600  . darbepoetin (ARANESP) injection - DIALYSIS  100 mcg Intravenous Q Tue-HD  . feeding supplement (NEPRO CARB STEADY)  1,000 mL Oral Q24H  . feeding supplement (PRO-STAT SUGAR FREE 64)  30 mL Per Tube TID  . heparin  5,000 Units Subcutaneous Q8H  . insulin aspart  0-15 Units Subcutaneous Q4H  . mouth rinse  15 mL Mouth Rinse 10 times per day  . pantoprazole sodium  40 mg Per Tube Q24H  . PARoxetine  40 mg Per Tube Daily  . sodium chloride flush  10-40 mL Intracatheter Q12H  . traZODone  25 mg Per Tube QHS   Elmarie Shiley, MD 02/01/2020, 10:17 AM

## 2020-02-01 NOTE — Progress Notes (Signed)
Spoke with patient's daughter, Madlyn Frankel and informed her that psychiatry was unable to complete the assessment due to patient not being cooperative.  Explained to daughter that psychiatrist recommended that family speak with her and encourage her to participate.  Katharine Look stated she is unable to get to the hospital and that patient hung up on her yesterday.  Katharine Look mentioned possibly doing a 3 way call with the patient, psychiatrist and herself if possible, in hopes to get patient to cooperate.  Eliezer [place]

## 2020-02-01 NOTE — Consult Note (Signed)
Patient seen via tele health in the presence of staff nurse Mendel Ryder, RN) but she is uncooperative with capacity assessment or psychiatric evaluation. She refused to answer questions and kept saying :''I don't feel good.''  Recommendations: -Consider contacting patient's family to determine if they have medical power of attorney and encourage patient to get treatment. -Re-consult psychiatric service when patient is able to cooperate with psychiatric assessment.  Corena Pilgrim, MD Attending psychiatrist.

## 2020-02-01 NOTE — Progress Notes (Signed)
PROGRESS NOTE    Bailey Cox  MPN:[id number] DOB: 1946-05-17 DOA: 01/05/2020 PCP: Patient, No Pcp Per   Brief Narrative: Per chart review/previous attending:74 year old obese Caucasian female who was admitted on 01/05/2020 from [facility] where she had just arrived after a long admission to [facility] where she was hospitalized from 10/2019 up until 01/05/2020 where she suffered a PEA arrest during endoscopy and ended up requiring a tracheostomy. She presented to [facility] with widespread peripheral edema and acidosis.   Originally she was hospitalized in September 2020 with acute cholecystitis and cholecystectomy. 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 While hospitalized she developed fluid overload and ultimately transferred to [facility] for dialysis. Her clinical course after that point became somewhat unclear but family described that she was on a ventilator for several procedures and was eventually left intubated. 1 of those procedures and endoscopy which she unfortunately suffered a cardiac arrest. Again details of the cardiac arrest or not clear but she remained on the ventilator and ultimately underwent a tracheostomy. At that time her hospital course was also complicated by MRSA pneumonia. She is ultimately able to come off of hemodialysis and treated with intermittent diuresis. During her hospital stay she bounced in and out of the ICU for a few times with volume overload and at the end of her admission at [facility] she still struggled with kidney disease and was being treated for urinary tract infection with vancomycin and cefepime. Plans were stopped this on 3 2. She was discharged on the [facility] system on [facility] in [place] upon arrival to [facility] they deemed her too sick for admission transferred her directly to [facility] emergency department with complaints of volume  overload and acidosis. Since then she has been in ICU and has been off of the ventilator and now on trach collar. She underwent CRRT from 3 9 until 312 and transferred to the floor on 3/14 after she was tolerating hemodialysis. Currently nephrology has been consulted and they feel that she has an AKI on CKD most likely cause of her AKI being from ATN from sepsis and family still wants aggressive measures so she was on CRRT from 3 7 until 311 for clearance and also for anasarca and volume management. Her CRRT was stopped on 3 11 and Dr. Moshe Cipro suspected that she will likely require renal replacement therapy in the setting of intermittent hemodialysis. They feel that if she has a trach and is dialysis dependent the only option would be send her to LTAC. The plan is for intermittent hemodialysis schedule on TTS here via [facility]. Placement is likely going to be an issue because of this.  Subjective:  Seen and examined Alert awake answers questions knows that she is in the hospital able to tell me her name date of birth. " I am not feeling well". Refuses to go to dialysis. Tube feed running. TRACH site with dressing intact Labs with potassium 4.1 bicarb 28 BUN up at 145 and creatinine 3.1 UOP charted 550 ml  Assessment & Plan:  MRSA pneumonia: Patient has completed antibiotics.  Overall she is afebrile.  AKI on CKD stage IV: Initially with AKI/ATN from sepsis.  Family desire renal Riss[place] therapy and patient on intermittent HD initially currently per nephrology on HD TTS- s/p HD on 01/22/2020, 01/24/2020, 01/27/2020. Per nephrology patient looks like she is dialysis dependent now.  Patient declined hemodialysis 3/25, 26, 27-despite  extensive counseling.Family has been informed and aware. Pychiatry consulted for competency evaluation.If patient declines dialysis, she may benefit with hospice palliative care evaluation and I have brought this up to the patient and palliative care is also  consulted.Nephrology team has reached out to the patient's family as well.  Await for further plan regarding dialysis from Nephro-pending competency eval.   Chronic respiratory failure, with tracheostomy- dencannulated 3/19/ history of PEA arrest: Had tracheostomy following PEA arrest previously in 2020- was on vent, off ventilator since 2/8.  Seen by pulmonary, speech therapy RT-tolerated capping 3/18 and 19 and status post decannulation. Cont SLP   T2DM, HbA1c stable 5.7 on sliding scale insulin.Blood sugar stable.Monitor. Recent Labs  Lab 01/31/20 1959 01/31/20 2333 02/01/20 0344 02/01/20 0811 02/01/20 1136  GLUCAP 125* 120* 127* 120* 149*   Hypertension: BP controlled  Anemia likely multifactorial, anemia of chronic kidney disease.  Continue Aranesp every Tuesday per nephrology. Recent Labs  Lab 01/26/20 0436 01/27/20 0422 01/27/20 0719 01/28/20 0425 01/29/20 0340  HGB 7.7* 7.4* 7.7* 8.3* 8.2*  HCT 25.0* 24.1* 25.4* 26.9* 27.2*   Hyponatremia: Resolved.  Anxiety disorder: Continue Paxil and BuSpar trazodone. Psych consulted.  GERD-on PPI  GOC:She is full code.She had palliative care discussion while she was in [place] per daughter. I Spoke with daughter-We discussed about palliative care follow along for [place] discussion.Daughter reports she has not been up for few months and has been in and out of hospital multiple times. She understands patient prognosis guarded.  We will continue to provide supportive care.  At this time patient is refusing dialysis difficult situation,psychiatry is consulted. Palliative care has been consulted as well.    Nutrition: Diet Order            Diet NPO time specified Except for: Ice Chips, Other (See Comments)  Diet effective now              Nutrition Problem: Inadequate oral intake Etiology: inability to eat Signs/Symptoms: NPO status Interventions: Tube feeding Body mass index is 32.62 kg/m.   DVT prophylaxis:Heparin Code  Status: Full code Family Communication: plan of care discussed with patient.  I had updated her daughter Katharine Look over the phone previously and multiple staffs nephrology had also updated her as pt is refusing HD  Disposition Plan: Patient came from: [facility]                                                                                                                         Anticipated d/c place:SNF Barriers to d/c OR conditions which need to be met to effect a safe d/c:Patient was in the ICU for 14 days status post tracheostomy required intermittent dialysis.  Patient has been refused for [facility] as well as select specialty LTAC. She is now refusing dialysis despite multiple counseling, she is aware that refusing dialysis may lead to death, palliative care psych has been consulted. Dispo pending  Consultants:  Nephrology: Dr. Augustin Coupe 01/06/2020  Wound care  Entered interventional radiology  Procedures:see note PICC pre admit > Trach pre admit > 3/5 IJ vas cath->  Left upper extremity Doppler negative for DVT 01/19/2020 Echo 10/24/2019>LVEF 55%, Grade 1 DD. RV systolic function normal.  Echo 3/2: LVEF 65-70%, grade II dysfunction. RVSP.  Microbiology:see note Blood 3/1 >> ng Urine 3/1 >> ng resp 3/4 >>pan-sensitivepseudomonas   Medications: Scheduled Meds:  B-complex with vitamin C  1 tablet Per Tube Daily   busPIRone  5 mg Oral BID   chlorhexidine gluconate (MEDLINE KIT)  15 mL Mouth Rinse BID   Chlorhexidine Gluconate Cloth  6 each Topical Daily   Chlorhexidine Gluconate Cloth  6 each Topical Q0600   darbepoetin (ARANESP) injection - DIALYSIS  100 mcg Intravenous Q Tue-HD   feeding supplement (NEPRO CARB STEADY)  1,000 mL Oral Q24H   feeding supplement (PRO-STAT SUGAR FREE 64)  30 mL Per Tube TID   heparin  5,000 Units Subcutaneous Q8H   insulin aspart  0-15 Units Subcutaneous Q4H   mouth rinse  15 mL Mouth Rinse 10 times per day   pantoprazole sodium   40 mg Per Tube Q24H   PARoxetine  40 mg Per Tube Daily   sodium chloride flush  10-40 mL Intracatheter Q12H   traZODone  25 mg Per Tube QHS   Continuous Infusions:  sodium chloride      Antimicrobials: Anti-infectives (From admission, onward)   Start     Dose/Rate Route Frequency Ordered Stop   01/10/20 0930  vancomycin (VANCOCIN) IVPB 1000 mg/200 mL premix  Status:  Discontinued     1,000 mg 200 mL/hr over 60 Minutes Intravenous  Once 01/10/20 0915 01/13/20 0623   01/06/20 2200  ceFEPIme (MAXIPIME) 2 g in sodium chloride 0.9 % 100 mL IVPB     2 g 200 mL/hr over 30 Minutes Intravenous Every 24 hours 01/05/20 2251 01/11/20 2234   01/05/20 2200  ceFEPIme (MAXIPIME) 2 g in sodium chloride 0.9 % 100 mL IVPB     2 g 200 mL/hr over 30 Minutes Intravenous  Once 01/05/20 2148 01/06/20 0015   01/05/20 2200  metroNIDAZOLE (FLAGYL) IVPB 500 mg     500 mg 100 mL/hr over 60 Minutes Intravenous  Once 01/05/20 2148 01/06/20 0015   01/05/20 2200  vancomycin (VANCOCIN) IVPB 1000 mg/200 mL premix  Status:  Discontinued     1,000 mg 200 mL/hr over 60 Minutes Intravenous  Once 01/05/20 2148 01/05/20 2151   01/05/20 2200  vancomycin (VANCOREADY) IVPB 2000 mg/400 mL  Status:  Discontinued     2,000 mg 200 mL/hr over 120 Minutes Intravenous  Once 01/05/20 2151 01/05/20 2223       Objective: Vitals: Today's Vitals   01/31/20 2058 01/31/20 2125 02/01/20 0535 02/01/20 0635  BP:   123/84   Pulse:   98   Resp:   18   Temp:   97.8 F (36.6 C)   TempSrc:   Oral   SpO2:   99%   Weight:    86.2 kg  Height:      PainSc: 9  Asleep      Intake/Output Summary (Last 24 hours) at 02/01/2020 1412 Last data filed at 02/01/2020 0900 Gross per 24 hour  Intake 906 ml  Output 150 ml  Net 756 ml   Filed Weights   01/27/20 0500 01/27/20 0655 02/01/20 0635  Weight: 91.1 kg 88.5 kg 86.2 kg   Weight change:    Intake/Output from previous day: 03/27  0701 - 03/28 0700 In: 1212 [NG/GT:1212] Out: 550  [Urine:550] Intake/Output this shift: No intake/output data recorded.  Examination:  General exam: Alert awake oriented to place self , told me it is march HEENT:Oral mucosa moist, Ear/Nose WNL grossly,dentition normal.  Trach site decannulated with wound healing.   Respiratory system: bilaterally clear,no use of accessory muscle, non tender. Cardiovascular system: S1 & S2 +, regular, No JVD. Gastrointestinal system: Abdomen soft, NT,ND, BS+. PEG  + Nervous System:Alert, awake, moving extremities Extremities: No edema, distal peripheral pulses palpable.  Skin: No rashes,no icterus. MSK: Normal muscle bulk,tone, power HD catheter on rt chest  Data Reviewed: I have personally reviewed following labs and imaging studies CBC: Recent Labs  Lab 01/26/20 0436 01/27/20 0422 01/27/20 0719 01/28/20 0425 01/29/20 0340  WBC 4.5  --  4.7  --  6.2  NEUTROABS 3.1  --   --   --   --   HGB 7.7* 7.4* 7.7* 8.3* 8.2*  HCT 25.0* 24.1* 25.4* 26.9* 27.2*  MCV 95.4  --  97.3  --  95.4  PLT 90*  --  106*  --  169*   Basic Metabolic Panel: Recent Labs  Lab 01/28/20 0425 01/29/20 0340 01/30/20 0431 01/31/20 0432 02/01/20 0440  NA 134* 133* 135 135 134*  K 3.7 3.7 3.9 4.0 4.1  CL 96* 95* 95* 96* 94*  CO2 _0 GLUCOSE 154* 113* 113* 130* 131*  BUN 54* 82* 104* 118* 145*  CREATININE 1.86* 2.27* 2.69* 3.03* 3.18*  CALCIUM 8.5* 8.5* 8.6* 8.5* 8.6*  MG 2.4 2.6* 2.5* 2.8* 2.9*  PHOS 3.4 5.0* 6.0* 6.3* 6.9*   GFR: Estimated Creatinine Clearance: 17.2 mL/min (A) (by C-G formula based on SCr of 3.18 mg/dL (H)). Liver Function Tests: Recent Labs  Lab 01/28/20 0425 01/29/20 0340 01/30/20 0431 01/31/20 0432 02/01/20 0440  ALBUMIN 2.6* 2.5* 2.5* 2.5* 2.5*   No results for input(s): LIPASE, AMYLASE in the last 168 hours. No results for input(s): AMMONIA in the last 168 hours. Coagulation Profile: No results for input(s): INR, PROTIME in the last 168 hours. Cardiac Enzymes: No  results for input(s): CKTOTAL, CKMB, CKMBINDEX, TROPONINI in the last 168 hours. BNP (last 3 results) No results for input(s): PROBNP in the last 8760 hours. HbA1C: No results for input(s): HGBA1C in the last 72 hours. CBG: Recent Labs  Lab 01/31/20 1959 01/31/20 2333 02/01/20 0344 02/01/20 0811 02/01/20 1136  GLUCAP 125* 120* 127* 120* 149*   Lipid Profile: No results for input(s): CHOL, HDL, LDLCALC, TRIG, CHOLHDL, LDLDIRECT in the last 72 hours. Thyroid Function Tests: No results for input(s): TSH, T4TOTAL, FREET4, T3FREE, THYROIDAB in the last 72 hours. Anemia Panel: No results for input(s): VITAMINB12, FOLATE, FERRITIN, TIBC, IRON, RETICCTPCT in the last 72 hours. Sepsis Labs: No results for input(s): PROCALCITON, LATICACIDVEN in the last 168 hours.  No results found for this or any previous visit (from the past 240 hour(s)).    Radiology Studies: No results found.   LOS: 27 days   Time spent: More than 50% of that time was spent in counseling and/or coordination of care.  Antonieta Pert, MD [facility]  02/01/2020, 2:12 PM

## 2020-02-02 ENCOUNTER — Inpatient Hospital Stay (HOSPITAL_COMMUNITY): Payer: Medicare Other

## 2020-02-02 DIAGNOSIS — Z515 Encounter for palliative care: Secondary | ICD-10-CM

## 2020-02-02 DIAGNOSIS — R1010 Upper abdominal pain, unspecified: Secondary | ICD-10-CM

## 2020-02-02 DIAGNOSIS — N184 Chronic kidney disease, stage 4 (severe): Secondary | ICD-10-CM | POA: Diagnosis not present

## 2020-02-02 DIAGNOSIS — R109 Unspecified abdominal pain: Secondary | ICD-10-CM

## 2020-02-02 DIAGNOSIS — Z7189 Other specified counseling: Secondary | ICD-10-CM

## 2020-02-02 DIAGNOSIS — N179 Acute kidney failure, unspecified: Secondary | ICD-10-CM | POA: Diagnosis not present

## 2020-02-02 DIAGNOSIS — J9601 Acute respiratory failure with hypoxia: Secondary | ICD-10-CM | POA: Diagnosis not present

## 2020-02-02 LAB — RENAL FUNCTION PANEL
Albumin: 2.5 g/dL — ABNORMAL LOW (ref 3.5–5.0)
Anion gap: 12 (ref 5–15)
BUN: 163 mg/dL — ABNORMAL HIGH (ref 8–23)
CO2: 29 mmol/L (ref 22–32)
Calcium: 8.5 mg/dL — ABNORMAL LOW (ref 8.9–10.3)
Chloride: 95 mmol/L — ABNORMAL LOW (ref 98–111)
Creatinine, Ser: 3.31 mg/dL — ABNORMAL HIGH (ref 0.44–1.00)
GFR calc Af Amer: 15 mL/min — ABNORMAL LOW (ref >60)
GFR calc non Af Amer: 13 mL/min — ABNORMAL LOW (ref >60)
Glucose, Bld: 98 mg/dL (ref 70–99)
Phosphorus: 7.2 mg/dL — ABNORMAL HIGH (ref 2.5–4.6)
Potassium: 4.6 mmol/L (ref 3.5–5.1)
Sodium: 136 mmol/L (ref 135–145)

## 2020-02-02 LAB — CBC
HCT: 26.1 % — ABNORMAL LOW (ref 36.0–46.0)
Hemoglobin: 7.9 g/dL — ABNORMAL LOW (ref 12.0–15.0)
MCH: 29.3 pg (ref 26.0–34.0)
MCHC: 30.3 g/dL (ref 30.0–36.0)
MCV: 96.7 fL (ref 80.0–100.0)
Platelets: 141 10*3/uL — ABNORMAL LOW (ref 150–400)
RBC: 2.7 MIL/uL — ABNORMAL LOW (ref 3.87–5.11)
RDW: 17.5 % — ABNORMAL HIGH (ref 11.5–15.5)
WBC: 4.9 10*3/uL (ref 4.0–10.5)
nRBC: 0 % (ref 0.0–0.2)

## 2020-02-02 LAB — GLUCOSE, CAPILLARY
Glucose-Capillary: 101 mg/dL — ABNORMAL HIGH (ref 70–99)
Glucose-Capillary: 108 mg/dL — ABNORMAL HIGH (ref 70–99)
Glucose-Capillary: 127 mg/dL — ABNORMAL HIGH (ref 70–99)
Glucose-Capillary: 134 mg/dL — ABNORMAL HIGH (ref 70–99)
Glucose-Capillary: 154 mg/dL — ABNORMAL HIGH (ref 70–99)
Glucose-Capillary: 155 mg/dL — ABNORMAL HIGH (ref 70–99)

## 2020-02-02 LAB — MAGNESIUM: Magnesium: 3 mg/dL — ABNORMAL HIGH (ref 1.7–2.4)

## 2020-02-02 MED ORDER — BISACODYL 10 MG RE SUPP
10.0000 mg | Freq: Every day | RECTAL | Status: DC
  Administered 2020-02-03 – 2020-03-07 (×11): 10 mg via RECTAL
  Filled 2020-02-02 (×17): qty 1

## 2020-02-02 MED ORDER — GUAIFENESIN 100 MG/5ML PO SOLN
10.0000 mL | Freq: Two times a day (BID) | ORAL | Status: DC
  Administered 2020-02-02 – 2020-02-05 (×6): 200 mg via ORAL
  Filled 2020-02-02 (×8): qty 10

## 2020-02-02 MED ORDER — HEPARIN SODIUM (PORCINE) 1000 UNIT/ML IJ SOLN
INTRAMUSCULAR | Status: AC
  Filled 2020-02-02: qty 4

## 2020-02-02 NOTE — Progress Notes (Addendum)
[facility] ROUNDING NOTE   Subjective:   This is a 74 year old female admitted 01/05/2020 from [facility] after having arrived from a long admission at [facility].  12/20-01/05/2020.  Where she had a PEA arrest following an endoscopy and ended up with a tracheostomy.  Her initial hospitalization in September 2021 was for acute cholecystectomy and cholecystitis.  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  Her hospital course has been complicated by MRSA pneumonia.  She was transferred from [facility] for volume overload and acidosis and underwent CRRT 01/13/2020 until 01/16/2020 where she has been tolerating hemodialysis.  She continues on a Tuesday Thursday Saturday schedule.  Plan is for placement back at [facility]  Blood pressure 143/71 pulse 78 temperature 97.5 O2 sats 97% 3 L.  Urine output 150 cc recorded 01/31/2025 weight 88.8 kg on admission 86.9 kg 02/02/2020   Sodium 136 potassium 4.6 chloride 95 CO2 29 BUN 163 creatinine 3.3 glucose 98 calcium 8.5 phosphorus 7.2 magnesium 3.0 albumin 2.5 hemoglobin 7.9 platelets 141  BuSpar 5 mg twice daily, insulin sliding scale, Protonix 40 mg every 24 hours, Paxil 40 mg daily, trazodone 25 mg nightly.     Objective:  Vital signs in last 24 hours:  Temp:  [97.5 F (36.4 C)-98.1 F (36.7 C)] 97.5 F (36.4 C) (03/29 0417) Pulse Rate:  [78-79] 78 (03/29 0417) Resp:  [18] 18 (03/28 1955) BP: (143-160)/(54-71) 143/71 (03/29 0417) SpO2:  [97 %-99 %] 97 % (03/29 0417) Weight:  [86.9 kg] 86.9 kg (03/29 0500)  Weight change: 0.7 kg Filed Weights   01/27/20 0655 02/01/20 0635 02/02/20 0500  Weight: 88.5 kg 86.2 kg 86.9 kg    Intake/Output: I/O last 3 completed shifts: In: 1866 [NG/GT:1866] Out: 400 [Urine:400]   Intake/Output this shift:  No intake/output data recorded.  Gen: Appears comfortable resting in bed CVS: Pulse regular rhythm, normal rate, S1 and S2 normal.  Tracheostomy site intact dressing. Resp: Clear to auscultation  bilaterally, right IJ TDC. Abd: Soft, obese, nontender Ext: Trace - 1+ lower extremity edema   Basic Metabolic Panel: Recent Labs  Lab 01/29/20 0340 01/29/20 0340 01/30/20 0431 01/30/20 0431 01/31/20 0432 02/01/20 0440 02/02/20 0315  NA 133*  --  135  --  135 134* 136  K 3.7  --  3.9  --  4.0 4.1 4.6  CL 95*  --  95*  --  96* 94* 95*  CO2 27  --  28  --  '29 28 29  '$ GLUCOSE 113*  --  113*  --  130* 131* 98  BUN 82*  --  104*  --  118* 145* 163*  CREATININE 2.27*  --  2.69*  --  3.03* 3.18* 3.31*  CALCIUM 8.5*   < > 8.6*   < > 8.5* 8.6* 8.5*  MG 2.6*  --  2.5*  --  2.8* 2.9* 3.0*  PHOS 5.0*  --  6.0*  --  6.3* 6.9* 7.2*   < > = values in this interval not displayed.    Liver Function Tests: Recent Labs  Lab 01/29/20 0340 01/30/20 0431 01/31/20 0432 02/01/20 0440 02/02/20 0315  ALBUMIN 2.5* 2.5* 2.5* 2.5* 2.5*   No results for input(s): LIPASE, AMYLASE in the last 168 hours. No results for input(s): AMMONIA in the last 168 hours.  CBC: Recent Labs  Lab 01/27/20 0422 01/27/20 0719 01/28/20 0425 01/29/20 0340 02/02/20 0315  WBC  --  4.7  --  6.2 4.9  HGB 7.4* 7.7* 8.3*  8.2* 7.9*  HCT 24.1* 25.4* 26.9* 27.2* 26.1*  MCV  --  97.3  --  95.4 96.7  PLT  --  106*  --  130* 141*    Cardiac Enzymes: No results for input(s): CKTOTAL, CKMB, CKMBINDEX, TROPONINI in the last 168 hours.  BNP: Invalid input(s): POCBNP  CBG: Recent Labs  Lab 02/01/20 1754 02/01/20 1950 02/01/20 2356 02/02/20 0354 02/02/20 0740  GLUCAP 135* 128* 121* 101* 127*    Microbiology: Results for orders placed or performed during the hospital encounter of 01/05/20  Blood culture (routine x 2)     Status: None   Collection Time: 01/05/20  8:04 PM   Specimen: BLOOD  Result Value Ref Range Status   Specimen Description BLOOD PICC LINE  Final   Special Requests   Final    BOTTLES DRAWN AEROBIC AND ANAEROBIC Blood Culture adequate volume   Culture   Final    NO GROWTH 5 DAYS Performed  at [facility], [place] [address] [place], [place] [address]    Report Status 01/10/2020 FINAL  Final  Blood culture (routine x 2)     Status: None   Collection Time: 01/05/20  8:04 PM   Specimen: BLOOD RIGHT HAND  Result Value Ref Range Status   Specimen Description BLOOD RIGHT HAND  Final   Special Requests   Final    BOTTLES DRAWN AEROBIC AND ANAEROBIC Blood Culture adequate volume   Culture   Final    NO GROWTH 5 DAYS Performed at [facility], [place] [address], [place], [place] [address]    Report Status 01/10/2020 FINAL  Final  Urine culture     Status: None   Collection Time: 01/05/20  8:04 PM   Specimen: Urine, Random  Result Value Ref Range Status   Specimen Description URINE, RANDOM  Final   Special Requests NONE  Final   Culture   Final    NO GROWTH Performed at [facility], [place] [address], [place], [place] [address]    Report Status 01/07/2020 FINAL  Final  Respiratory Panel by RT PCR (Flu A&B, Covid) - Nasopharyngeal Swab     Status: None   Collection Time: 01/05/20  8:04 PM   Specimen: Nasopharyngeal Swab  Result Value Ref Range Status   SARS Coronavirus 2 by RT PCR NEGATIVE NEGATIVE Final    Comment: (NOTE) SARS-CoV-2 target nucleic acids are NOT DETECTED. The SARS-CoV-2 RNA is generally detectable in upper respiratoy specimens during the acute phase of infection. The lowest concentration of SARS-CoV-2 viral copies this assay can detect is 131 copies/mL. A negative result does not preclude SARS-Cov-2 infection and should not be used as the sole basis for treatment or other patient management decisions. A negative result may occur with  improper specimen collection/handling, submission of specimen other than nasopharyngeal swab, presence of viral mutation(s) within the areas targeted by this assay, and inadequate number of viral copies (<131 copies/mL). A negative result must be combined with clinical observations, patient history, and  epidemiological information. The expected result is Negative. Fact Sheet for Patients:  PinkCheek.be Fact Sheet for Healthcare Providers:  GravelBags.it This test is not yet ap proved or cleared by the [place] FDA and  has been authorized for detection and/or diagnosis of SARS-CoV-2 by FDA under an Emergency Use Authorization (EUA). This EUA will remain  in effect (meaning this test can be used) for the duration of the COVID-19 declaration under Section 564(b)(1) of the Act,  21 U.S.C. section 360bbb-3(b)(1), unless the authorization is terminated or revoked sooner.    Influenza A by PCR NEGATIVE NEGATIVE Final   Influenza B by PCR NEGATIVE NEGATIVE Final    Comment: (NOTE) The Xpert Xpress SARS-CoV-2/FLU/RSV assay is intended as an aid in  the diagnosis of influenza from Nasopharyngeal swab specimens and  should not be used as a sole basis for treatment. Nasal washings and  aspirates are unacceptable for Xpert Xpress SARS-CoV-2/FLU/RSV  testing. Fact Sheet for Patients: PinkCheek.be Fact Sheet for Healthcare Providers: GravelBags.it This test is not yet approved or cleared by the [place] FDA and  has been authorized for detection and/or diagnosis of SARS-CoV-2 by  FDA under an Emergency Use Authorization (EUA). This EUA will remain  in effect (meaning this test can be used) for the duration of the  Covid-19 declaration under Section 564(b)(1) of the Act, 21  U.S.C. section 360bbb-3(b)(1), unless the authorization is  terminated or revoked. Performed at [facility], [place] [address], [place], [place] [address]   MRSA PCR Screening     Status: Abnormal   Collection Time: 01/06/20  2:20 AM   Specimen: Nasopharyngeal  Result Value Ref Range Status   MRSA by PCR POSITIVE (A) NEGATIVE Final    Comment:        The GeneXpert MRSA Assay (FDA approved for  NASAL specimens only), is one component of a comprehensive MRSA colonization surveillance program. It is not intended to diagnose MRSA infection nor to guide or monitor treatment for MRSA infections. RESULT CALLED TO, READ BACK BY AND VERIFIED WITH: STAPLETON,R RN [phone] MITCHELL,L   Culture, respiratory (non-expectorated)     Status: None   Collection Time: 01/08/20  9:36 AM   Specimen: Tracheal Aspirate; Respiratory  Result Value Ref Range Status   Specimen Description TRACHEAL ASPIRATE  Final   Special Requests NONE  Final   Gram Stain   Final    RARE WBC PRESENT, PREDOMINANTLY PMN RARE GRAM NEGATIVE RODS RARE GRAM POSITIVE COCCI IN PAIRS Performed at [facility], [place] [address], [place], [place] [address]    Culture RARE PSEUDOMONAS AERUGINOSA  Final   Report Status 01/10/2020 FINAL  Final   Organism ID, Bacteria PSEUDOMONAS AERUGINOSA  Final      Susceptibility   Pseudomonas aeruginosa - MIC*    CEFTAZIDIME 2 SENSITIVE Sensitive     CIPROFLOXACIN <=0.25 SENSITIVE Sensitive     GENTAMICIN <=1 SENSITIVE Sensitive     IMIPENEM 2 SENSITIVE Sensitive     PIP/TAZO <=4 SENSITIVE Sensitive     CEFEPIME 2 SENSITIVE Sensitive     * RARE PSEUDOMONAS AERUGINOSA    Coagulation Studies: No results for input(s): LABPROT, INR in the last 72 hours.  Urinalysis: No results for input(s): COLORURINE, LABSPEC, PHURINE, GLUCOSEU, HGBUR, BILIRUBINUR, KETONESUR, PROTEINUR, UROBILINOGEN, NITRITE, LEUKOCYTESUR in the last 72 hours.  Invalid input(s): APPERANCEUR    Imaging: No results found.   Medications:   . sodium chloride     . B-complex with vitamin C  1 tablet Per Tube Daily  . busPIRone  5 mg Oral BID  . chlorhexidine gluconate (MEDLINE KIT)  15 mL Mouth Rinse BID  . Chlorhexidine Gluconate Cloth  6 each Topical Daily  . Chlorhexidine Gluconate Cloth  6 each Topical Q0600  . darbepoetin (ARANESP) injection - DIALYSIS  100 mcg Intravenous Q Tue-HD  . feeding  supplement (NEPRO CARB STEADY)  1,000 mL Oral Q24H  . feeding supplement (PRO-STAT SUGAR FREE  64)  30 mL Per Tube TID  . heparin  5,000 Units Subcutaneous Q8H  . insulin aspart  0-15 Units Subcutaneous Q4H  . mouth rinse  15 mL Mouth Rinse 10 times per day  . pantoprazole sodium  40 mg Per Tube Q24H  . PARoxetine  40 mg Per Tube Daily  . sodium chloride flush  10-40 mL Intracatheter Q12H  . traZODone  25 mg Per Tube QHS   fentaNYL (SUBLIMAZE) injection, metoprolol tartrate, sodium chloride, sodium chloride flush  Assessment/ Plan:  1. Acute kidney Injury on chronic kidney disease stage IV: Suspected acute kidney injury is from sepsis/ATN.  Previously on CRRT beginning 3/7 and later transitioned to intermittent hemodialysis after hemodynamic stability established.  She is nonoliguric and with worsening azotemia on labs seen along with slow rise of creatinine.  She has refused hemodialysis for the last 3 days her daughter is coming in today to help facilitate discussion/goals of care.    Appreciate assistance from Dr. Darleene Cleaver psychiatry.  Patient is uncooperative with capacity assessment or psychiatric evaluation.  She is agreeing to dialysis 02/02/2020.  Proceed with treatment. 2.  Sepsis: Suspected to be of urinary source but appears to have now resolved status post antibiotic treatment. 3.  History of PEA arrest status post tracheostomy; decannulated on 3/19 and appears to be having decent oxygenation since then. 4.  Anemia: Secondary to chronic illness exacerbated by recent hospitalization-adequate iron stores and now on ESA. 5.  Disposition: Difficult situation,  refuses dialysis and insists that "she does not want to die".   LOS: [place] '@TODAY''@10'$ :19 AM

## 2020-02-02 NOTE — TOC Progression Note (Signed)
Transition of Care [facility]) - Progression Note    Patient Details  Name: Bailey Cox MRN: [id number] Date of Birth: 1946/07/04  Transition of Care [facility]) CM/SW [place], [place] Phone Number: 02/02/2020, 10:27 AM  Clinical Narrative:    CSW following for stability. Noted pt refusal of dialysis, if continues may consider GOC. Pt will need to be able to sit in recliner for dialysis for discharge placement.    Expected Discharge Plan: Skilled Nursing Facility Barriers to Discharge: Continued Medical Work up, Other (comment)(ability to tolerate dialysis in recliner)  Expected Discharge Plan and Services Expected Discharge Plan: [place] In-house Referral: Clinical Social Work Discharge Planning Services: CM Consult Post Acute Care Choice: [place] Living arrangements for the past 2 months: [place]                   Readmission Risk Interventions Readmission Risk Prevention Plan 01/28/2020  Transportation Screening Complete  PCP or Specialist Appt within 3-5 Days Not Complete  Not Complete comments plan for SNF  HRI or [place] Not Complete  HRI or Home Care Consult comments plan for SNF  Social Work Consult for [place] Planning/Counseling Complete  Palliative Care Screening Complete  Medication Review Press photographer) Referral to Pharmacy  PCP or Specialist appointment within 3-5 days of discharge Not Complete  PCP/Specialist Appt Not Complete comments plan for SNF  HRI or Home Care Consult Complete  SW Recovery Care/Counseling Consult Complete  Palliative Care Screening Not Complete  Comments appropriate at this time  [place] Complete

## 2020-02-02 NOTE — Consult Note (Signed)
Consultation Note Date: 02/02/2020   Patient Name: Bailey Cox  DOB: 03-Sep-1946  MRN: [id number]  Age / Sex: 74 y.o., female  PCP: Patient, No Pcp Per Referring Physician: Antonieta Pert, MD  Reason for Consultation: Establishing goals of care and Psychosocial/spiritual support  HPI/Patient Profile: 74 y.o. female  with past medical history of cholecystectomy with CBD stenting in September followed by PEA arrest during endoscopy that lead to hospitalization from December 2020 until January 05, 2020 at [facility]). 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 She was discharged to [facility] on 3/1 who immediately sent her to [facility].  On admission 01/05/2020 she was found to have acidosis with acute on chronic Cox failure.  Unfortunately she has not had renal recovery.  Clinical Assessment and Goals of Care:  I have reviewed medical records including EPIC notes, labs and imaging, received report from the care team, examined the patient and spoke on the phone with her daughter Bailey Cox) to discuss diagnosis prognosis, [place], EOL wishes, disposition and options.  I introduced Palliative Medicine as specialized medical care for people living with serious illness. It focuses on providing relief from the symptoms and stress of a serious illness.   The patient has a depressed fatigued affect and did not feel like talking.  She was having abdominal pain.  She had not decided if she wanted to go to hemodialysis or note.   She told me she had 4 daughters who are all from [place] and that Bailey Cox is the one that helps her make medical decisions.  I asked her about code status and she requested resuscitation.  I asked her if she wanted to be on life support and she replied "No, but I want you to help me."  I reassured her that we "will  always help you."  I talked with Bailey Cox on the phone.  She is her documented HCPOA.  She is faxing over the paper work to go on the hard chart.  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  She is accustomed to having access to EMR for her mother and would like to have it thru [facility].    Bailey Cox stated that the entire time her mother was at [facility] they told her that hemodialysis was not needed- but Bailey Cox was concerned that her mother was swelling up.  Bailey Cox has explained to her mother that she has to take hemodialysis regularly but after not being required to take it at [facility].  She is depressed but is not refusing HD because she is ready for EOL (per Bailey Cox).  Bailey Cox does not believe that she must have hemodialysis on a regular basis.    Bailey Cox encouraged me to tell her mother to sit up and take HD - that way she can be sent back to [facility] to be near her daughters.  Bailey Cox felt this would be motivating to Bailey Cox.  We talked about the code status conversation.  Bailey Cox reported that  her mother means "If there is no hope then she does not want resuscitation".  We did agree that Bailey Cox would not want long term life support despite already having a trach and PEG.  Palliative Medicine Team was introduced.  Questions and concerns were addressed.  The family was encouraged to call with questions or concerns.    Primary Decision Maker:  Patient.    SUMMARY OF RECOMMENDATIONS     Daughter Bailey Cox is HCPOA.  Patient is willing to take HD but is suffering with abdominal pain.  She was told at [facility] that she did not need on-going HD and therefore does not understand why she has to take it consistently.  Code Status/Advance Care Planning:  Full code but no long term life support. - would recommend further discussion on this topic after a few days.   Symptom Management:   Discussed with attending who ordered abd xray  Ordered ducolax suppository  Per  daughter may need flexi-seal rectal tube.  Palliative Prophylaxis:   Frequent Pain Assessment and Turn Reposition  Psycho-social/Spiritual:   Desire for further Chaplaincy support:  Not discussed but would likely be very helpful.  Prognosis: unable to determine.  Given PEA arrest, Ogilvie's, ESRD it would not be surprising for her to pass away with in 1 year even given full scope treatment.  Patient is at high risk for acute decline and rehospitalization.    Discharge Planning: LTAC vs SNF if she can sit up to dialyze      Primary Diagnoses: Present on Admission: . Shock ([place])   I have reviewed the medical record, interviewed the patient and family, and examined the patient. The following aspects are pertinent.  Past Medical History:  Diagnosis Date  . Chronic respiratory failure ([place])   . CKD (chronic Cox disease)   . DM (diabetes mellitus) ([place])   . GERD (gastroesophageal reflux disease)   . HTN (hypertension)   . Ogilvie syndrome   . Tracheostomy status ([place])    2020  . Vascular dementia [facility])    Social History   Socioeconomic History  . Marital status: Single    Spouse name: Not on file  . Number of children: Not on file  . Years of education: Not on file  . Highest education level: Not on file  Occupational History  . Not on file  Tobacco Use  . Smoking status: Not on file  Substance and Sexual Activity  . Alcohol use: Not on file  . Drug use: Not on file  . Sexual activity: Not on file  Other Topics Concern  . Not on file  Social History Narrative  . Not on file   Social Determinants of Health   Financial Resource Strain:   . Difficulty of Paying Living Expenses:   Food Insecurity:   . Worried About Charity fundraiser in the Last Year:   . Arboriculturist in the Last Year:   Transportation Needs:   . Film/video editor (Medical):   Marland Kitchen Lack of Transportation (Non-Medical):   Physical Activity:   . Days of Exercise per Week:   .  Minutes of Exercise per Session:   Stress:   . Feeling of Stress :   Social Connections:   . Frequency of Communication with Friends and Family:   . Frequency of Social Gatherings with Friends and Family:   . Attends Religious Services:   . Active Member of Clubs or Organizations:   . Attends Archivist  Meetings:   Marland Kitchen Marital Status:    History reviewed. No pertinent family history. Scheduled Meds: . B-complex with vitamin C  1 tablet Per Tube Daily  . bisacodyl  10 mg Rectal Daily  . busPIRone  5 mg Oral BID  . chlorhexidine gluconate (MEDLINE KIT)  15 mL Mouth Rinse BID  . Chlorhexidine Gluconate Cloth  6 each Topical Daily  . Chlorhexidine Gluconate Cloth  6 each Topical Q0600  . darbepoetin (ARANESP) injection - DIALYSIS  100 mcg Intravenous Q Tue-HD  . feeding supplement (NEPRO CARB STEADY)  1,000 mL Oral Q24H  . feeding supplement (PRO-STAT SUGAR FREE 64)  30 mL Per Tube TID  . guaiFENesin  10 mL Oral BID  . heparin  5,000 Units Subcutaneous Q8H  . insulin aspart  0-15 Units Subcutaneous Q4H  . mouth rinse  15 mL Mouth Rinse 10 times per day  . pantoprazole sodium  40 mg Per Tube Q24H  . PARoxetine  40 mg Per Tube Daily  . sodium chloride flush  10-40 mL Intracatheter Q12H  . traZODone  25 mg Per Tube QHS   Continuous Infusions: . sodium chloride     PRN Meds:.fentaNYL (SUBLIMAZE) injection, metoprolol tartrate, sodium chloride, sodium chloride flush Allergies  Allergen Reactions  . Drug Class [Clindamycin/Lincomycin] Hives  . Drug Ingredient [Cephalexin] Hives    Pt has tolerated Ceftriaxone, Cefepime, and Meropenem   . Nsaids Hives    Unsure of which agents  . Penicillins Hives    Pt has tolerated Ceftriaxone, Cefepime, and Meropenem   . Sulfa Antibiotics Hives  . Dilaudid [Hydromorphone Hcl] Other (See Comments)    Shortness of breath/confusion    Review of Systems Patient somnolent with flat affect - but complains of abdominal pain.  Physical Exam   Well developed obese female, awake, alert, interactive with flat affect. Trach collar without respiratory distress. Abdomen was slightly distended and diffusely tender to palpation with no bowel sounds.  PEG in place no erythema or drainage noted.  Vital Signs: BP (!) 143/71 (BP Location: Right Leg)   Pulse 78   Temp (!) 97.5 F (36.4 C) (Oral)   Resp 18   Ht '5\' 4"'$  (1.626 m)   Wt 86.9 kg   SpO2 97%   BMI 32.88 kg/m  Pain Scale: 0-10 POSS *See Group Information*: 1-Acceptable,Awake and alert Pain Score: 0-No pain   SpO2: SpO2: 97 % O2 Device:SpO2: 97 % O2 Flow Rate: .O2 Flow Rate (L/min): 3 L/min  IO: Intake/output summary:   Intake/Output Summary (Last 24 hours) at 02/02/2020 1315 Last data filed at 02/02/2020 8110 Gross per 24 hour  Intake 960 ml  Output 250 ml  Net 710 ml    LBM: Last BM Date: 02/01/20 Baseline Weight: Weight: 113 kg Most recent weight: Weight: 86.9 kg     Palliative Assessment/Data: 20%     Time In: 11:00 Time Out: 12:00 Time Total: 60 min. Visit consisted of counseling and education dealing with the complex and emotionally intense issues surrounding the need for palliative care and symptom management in the setting of serious and potentially life-threatening illness. Greater than 50%  of this time was spent counseling and coordinating care related to the above assessment and plan.  Signed by: Florentina Jenny, PA-C Palliative Medicine  Please contact Palliative Medicine Team phone at [phone] for questions and concerns.  For individual provider: See Shea Evans

## 2020-02-02 NOTE — Consult Note (Signed)
Patient was to be evaluated by psychiatry for competency, which is a legal matter and cannot be done by providers--only a court of law.  Capacity to make decision to have dialysis was unable to be performed because the patient was in dialysis.   Bailey Cox, PMHNP

## 2020-02-02 NOTE — Progress Notes (Signed)
PROGRESS NOTE    Bailey Cox  UXN:[id number] DOB: Mar 20, 1946 DOA: 01/05/2020 PCP: Patient, No Pcp Per   Brief Narrative: Per chart review/previous attending:74 year old obese Caucasian female who was admitted on 01/05/2020 from [facility] where she had just arrived after a long admission to [facility] where she was hospitalized from 10/2019 up until 01/05/2020 where she suffered a PEA arrest during endoscopy and ended up requiring a tracheostomy. She presented to [facility] with widespread peripheral edema and acidosis.   Originally she was hospitalized in September 2020 with acute cholecystitis and cholecystectomy. 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 While hospitalized she developed fluid overload and ultimately transferred to [facility] for dialysis. Her clinical course after that point became somewhat unclear but family described that she was on a ventilator for several procedures and was eventually left intubated. 1 of those procedures and endoscopy which she unfortunately suffered a cardiac arrest. Again details of the cardiac arrest or not clear but she remained on the ventilator and ultimately underwent a tracheostomy. At that time her hospital course was also complicated by MRSA pneumonia. She is ultimately able to come off of hemodialysis and treated with intermittent diuresis. During her hospital stay she bounced in and out of the ICU for a few times with volume overload and at the end of her admission at [facility] she still struggled with kidney disease and was being treated for urinary tract infection with vancomycin and cefepime. Plans were stopped this on 3 2. She was discharged on the [facility] system on [facility] in [place] upon arrival to [facility] they deemed her too sick for admission transferred her directly to [facility] emergency department with complaints of volume  overload and acidosis. Since then she has been in ICU and has been off of the ventilator and now on trach collar. She underwent CRRT from 3 9 until 312 and transferred to the floor on 3/14 after she was tolerating hemodialysis. Currently nephrology has been consulted and they feel that she has an AKI on CKD most likely cause of her AKI being from ATN from sepsis and family still wants aggressive measures so she was on CRRT from 3 7 until 311 for clearance and also for anasarca and volume management. Her CRRT was stopped on 3 11 and Dr. Moshe Cipro suspected that she will likely require renal replacement therapy in the setting of intermittent hemodialysis. They feel that if she has a trach and is dialysis dependent the only option would be send her to LTAC. The plan is for intermittent hemodialysis schedule on TTS here via [facility]. Placement is likely going to be an issue because of this.  Subjective:  Seen and examined this morning appears somewhat pleasant today. Answer questions answered appropriate and interactive. She voiced that she does not want artificial life support  she agrees for dialysis today-notified nephrologist.. Seen by speech therapy. Denies a specific complaint complains of not feeling well. Tube feed running. Last BM 01/21/27 x1. trach site with dressing intact uop 250 ml.  Assessment & Plan:  AKI on CKD stage IV: Initially with AKI/ATN from sepsis.  Family desire renal [place] therapy and patient on intermittent HD initially currently per nephrology on HD TTS- s/p HD on 01/22/2020, 01/24/2020, 01/27/2020 and declined hemodialysis 3/25, 26, 27, 28.  Again encouraged and had extensive discussion this morning, she is agreeable for dialysis today and I have immediately notified the  nephrologist. Family has been informed and aware. Pychiatry consulted for competency evaluation but was uncooperative but it appears she is agreeing now and psych reconsulted.  Palliative also consulted.     MRSA pneumonia: Patient has completed antibiotics.  Afebrile and wbc at 4.9  Chronic respiratory failure, with tracheostomy- dencannulated 3/19/ history of PEA arrest: Had tracheostomy following PEA arrest previously in 2020- was on vent, off ventilator since 2/8.  Seen by pulmonary, speech therapy RT-tolerated capping 3/18 and 19 and status post decannulation.  Continues supportive measures.     T2DM, HbA1c stable 5.7 on sliding scale insulin.Blood sugar stable.Monitor. Recent Labs  Lab 02/01/20 1754 02/01/20 1950 02/01/20 2356 02/02/20 0354 02/02/20 0740  GLUCAP 135* 128* 121* 101* 127*   Hypertension: BP controlled.  Anemia likely multifactorial, anemia of chronic kidney disease.  Continue Aranesp every Tuesday per nephrology. Recent Labs  Lab 01/27/20 0422 01/27/20 0719 01/28/20 0425 01/29/20 0340 02/02/20 0315  HGB 7.4* 7.7* 8.3* 8.2* 7.9*  HCT 24.1* 25.4* 26.9* 27.2* 26.1*   Hyponatremia: Resolved.  Anxiety disorder: Continue Paxil and BuSpar trazodone. Psych consulted.  GERD-on PPI  GOC:She is full code.She had palliative care discussion while she was in [place] per daughter. I Spoke with daughter-We discussed about palliative care follow along for [place] discussion.Daughter reports she has not been up for few months and has been in and out of hospital multiple times. She understands patient prognosis guarded.  We will continue to provide supportive care.  Palliative care also consulted.  Nutrition: Speech following, keep n.p.o., continue tube feeds check x-ray as patient complained of abdominal pain to the palliative care. Diet Order            Diet NPO time specified Except for: Ice Chips, Other (See Comments)  Diet effective now              Nutrition Problem: Inadequate oral intake Etiology: inability to eat Signs/Symptoms: NPO status Interventions: Tube feeding Body mass index is 32.88 kg/m.   DVT prophylaxis:Heparin Code Status: Full  code Family Communication: plan of care discussed with patient.  I had updated her daughter Katharine Look over the phone previously and multiple staffs nephrologist had also updated her as she refused HD. Palliative to see and discuss.  Disposition Plan: Patient came from: [facility] Anticipated d/c place:SNF Barriers to d/c OR conditions which need to be met to effect a safe d/c:Patient was in the ICU for 14 days status post tracheostomy required intermittent dialysis.  Patient has been refused for [facility] as well as select specialty LTAC. She is now refusing dialysis despite multiple counseling- agreeing for HD today. Psych/palliative also consulted.  Consultants:  Nephrology: Dr. Augustin Coupe 01/06/2020  Wound care  Entered interventional radiology  Psychiatry  Palliative care  Procedures:see note PICC pre admit > Trach pre admit > 3/5 IJ vas cath->  Left upper extremity Doppler negative for DVT 01/19/2020 Echo 10/24/2019>LVEF 55%, Grade 1 DD. RV systolic function normal.  Echo 3/2: LVEF 65-70%, grade II dysfunction. RVSP.  Microbiology:see note Blood 3/1 >> ng Urine 3/1 >> ng resp 3/4 >>pan-sensitivepseudomonas   Medications: Scheduled Meds:  B-complex with vitamin C  1 tablet Per Tube Daily   busPIRone  5 mg Oral BID   chlorhexidine gluconate (MEDLINE KIT)  15 mL Mouth Rinse BID   Chlorhexidine Gluconate Cloth  6 each Topical Daily   Chlorhexidine Gluconate Cloth  6 each Topical Q0600   darbepoetin (ARANESP) injection - DIALYSIS  100 mcg Intravenous Q  Tue-HD   feeding supplement (NEPRO CARB STEADY)  1,000 mL Oral Q24H   feeding supplement (PRO-STAT SUGAR FREE 64)  30 mL Per Tube TID   heparin  5,000 Units Subcutaneous Q8H   insulin aspart  0-15 Units Subcutaneous Q4H   mouth rinse  15 mL Mouth Rinse 10 times per day   pantoprazole sodium  40 mg Per Tube Q24H   PARoxetine  40 mg Per Tube Daily   sodium chloride flush  10-40 mL Intracatheter Q12H    traZODone  25 mg Per Tube QHS   Continuous Infusions:  sodium chloride      Antimicrobials: Anti-infectives (From admission, onward)   Start     Dose/Rate Route Frequency Ordered Stop   01/10/20 0930  vancomycin (VANCOCIN) IVPB 1000 mg/200 mL premix  Status:  Discontinued     1,000 mg 200 mL/hr over 60 Minutes Intravenous  Once 01/10/20 0915 01/13/20 0623   01/06/20 2200  ceFEPIme (MAXIPIME) 2 g in sodium chloride 0.9 % 100 mL IVPB     2 g 200 mL/hr over 30 Minutes Intravenous Every 24 hours 01/05/20 2251 01/11/20 2234   01/05/20 2200  ceFEPIme (MAXIPIME) 2 g in sodium chloride 0.9 % 100 mL IVPB     2 g 200 mL/hr over 30 Minutes Intravenous  Once 01/05/20 2148 01/06/20 0015   01/05/20 2200  metroNIDAZOLE (FLAGYL) IVPB 500 mg     500 mg 100 mL/hr over 60 Minutes Intravenous  Once 01/05/20 2148 01/06/20 0015   01/05/20 2200  vancomycin (VANCOCIN) IVPB 1000 mg/200 mL premix  Status:  Discontinued     1,000 mg 200 mL/hr over 60 Minutes Intravenous  Once 01/05/20 2148 01/05/20 2151   01/05/20 2200  vancomycin (VANCOREADY) IVPB 2000 mg/400 mL  Status:  Discontinued     2,000 mg 200 mL/hr over 120 Minutes Intravenous  Once 01/05/20 2151 01/05/20 2223       Objective: Vitals: Today's Vitals   02/01/20 2209 02/02/20 0417 02/02/20 0500 02/02/20 0758  BP:  (!) 143/71    Pulse:  78    Resp:      Temp:  (!) 97.5 F (36.4 C)    TempSrc:  Oral    SpO2:  97%    Weight:   86.9 kg   Height:      PainSc: Asleep   0-No pain    Intake/Output Summary (Last 24 hours) at 02/02/2020 1113 Last data filed at 02/02/2020 0643 Gross per 24 hour  Intake 960 ml  Output 250 ml  Net 710 ml   Filed Weights   01/27/20 0655 02/01/20 0635 02/02/20 0500  Weight: 88.5 kg 86.2 kg 86.9 kg   Weight change: 0.7 kg   Intake/Output from previous day: 03/28 0701 - 03/29 0700 In: 960 [NG/GT:960] Out: 250 [Urine:250] Intake/Output this shift: No intake/output data recorded.  Examination:  General  exam: Alert awake interactive, appropriate.   HEENT:Oral mucosa moist, Ear/Nose WNL grossly,dentition normal.  Trach site decannulated with wound healing.   Respiratory system: bilaterally clear,no use of accessory muscle, non tender. Cardiovascular system: S1 & S2 +, regular, No JVD. Gastrointestinal system: Abdomen soft, mildly tender not distended, bowel sounds sluggish,  Nervous System:Alert, awake, oriented to self, place, people, situation. Extremities: No edema, distal peripheral pulses palpable.  Skin: No rashes,no icterus. MSK: Normal muscle bulk,tone, power HD catheter on rt chest  Data Reviewed: I have personally reviewed following labs and imaging studies CBC: Recent Labs  Lab 01/27/20 0422 01/27/20 0719  01/28/20 0425 01/29/20 0340 02/02/20 0315  WBC  --  4.7  --  6.2 4.9  HGB 7.4* 7.7* 8.3* 8.2* 7.9*  HCT 24.1* 25.4* 26.9* 27.2* 26.1*  MCV  --  97.3  --  95.4 96.7  PLT  --  106*  --  130* 096*   Basic Metabolic Panel: Recent Labs  Lab 01/29/20 0340 01/30/20 0431 01/31/20 0432 02/01/20 0440 02/02/20 0315  NA 133* 135 135 134* 136  K 3.7 3.9 4.0 4.1 4.6  CL 95* 95* 96* 94* 95*  CO2 _0 GLUCOSE 113* 113* 130* 131* 98  BUN 82* 104* 118* 145* 163*  CREATININE 2.27* 2.69* 3.03* 3.18* 3.31*  CALCIUM 8.5* 8.6* 8.5* 8.6* 8.5*  MG 2.6* 2.5* 2.8* 2.9* 3.0*  PHOS 5.0* 6.0* 6.3* 6.9* 7.2*   GFR: Estimated Creatinine Clearance: 16.6 mL/min (A) (by C-G formula based on SCr of 3.31 mg/dL (H)). Liver Function Tests: Recent Labs  Lab 01/29/20 0340 01/30/20 0431 01/31/20 0432 02/01/20 0440 02/02/20 0315  ALBUMIN 2.5* 2.5* 2.5* 2.5* 2.5*   No results for input(s): LIPASE, AMYLASE in the last 168 hours. No results for input(s): AMMONIA in the last 168 hours. Coagulation Profile: No results for input(s): INR, PROTIME in the last 168 hours. Cardiac Enzymes: No results for input(s): CKTOTAL, CKMB, CKMBINDEX, TROPONINI in the last 168 hours. BNP (last 3  results) No results for input(s): PROBNP in the last 8760 hours. HbA1C: No results for input(s): HGBA1C in the last 72 hours. CBG: Recent Labs  Lab 02/01/20 1754 02/01/20 1950 02/01/20 2356 02/02/20 0354 02/02/20 0740  GLUCAP 135* 128* 121* 101* 127*   Lipid Profile: No results for input(s): CHOL, HDL, LDLCALC, TRIG, CHOLHDL, LDLDIRECT in the last 72 hours. Thyroid Function Tests: No results for input(s): TSH, T4TOTAL, FREET4, T3FREE, THYROIDAB in the last 72 hours. Anemia Panel: No results for input(s): VITAMINB12, FOLATE, FERRITIN, TIBC, IRON, RETICCTPCT in the last 72 hours. Sepsis Labs: No results for input(s): PROCALCITON, LATICACIDVEN in the last 168 hours.  No results found for this or any previous visit (from the past 240 hour(s)).    Radiology Studies: No results found.   LOS: 28 days   Time spent: More than 50% of that time was spent in counseling and/or coordination of care.  Antonieta Pert, MD [facility]  02/02/2020, 11:13 AM

## 2020-02-02 NOTE — Progress Notes (Signed)
  Speech Language Pathology Treatment: Dysphagia  Patient Details Name: Bailey Cox MRN: [id number] DOB: 1946/03/10 Today's Date: 02/02/2020 Time: 0920-0930 SLP Time Calculation (min) (ACUTE ONLY): 10 min  Assessment / Plan / Recommendation Clinical Impression  Pt seen with MD at bedside. She was alert and interactive, but somewhat contradictory in her conversation regarding her wishes. Assisted by providing simple binary choices and simple Y/N questions. Pt agreeable to icee today, accepted 4 bites of italian ice by spoon without difficulty subjectively, then stated that was enough. Again encouraged staff to offer liquids and posted a sign with instructions. Response more favorable today. Will continue efforts.   HPI HPI: 74 y.o. female who had a prolonged 70 day hospitalization at [facility] with a history of HTN COPD DM hypothyroidism Ogilvie's syndrome. PEA arrest during EGD in early December with duodenal ulcer noted on EGD. Intermittent hyperkalemia treatet medically w/ Kayexalate and now recently w/ worsening renal function over the past few days prior to admission to [facility].  during admission at [facility] pt weaned off the vent and eventually decannulated. According to her daughter, she has not had anything to eat or drink since November due to primary problem Olglivie's Syndrome, but had no difficulty with swallowing prior.      SLP Plan  Continue with current plan of care       Recommendations  Diet recommendations: Thin liquid Liquids provided via: Teaspoon;Cup;Straw Medication Administration: Via alternative means Supervision: Staff to assist with self feeding;Full supervision/cueing for compensatory strategies;Trained caregiver to feed patient Compensations: Slow rate;Small sips/bites                Follow up Recommendations: Skilled Nursing facility SLP Visit Diagnosis: Dysphagia, unspecified (R13.10) Plan: Continue with current plan of care       GO                [name], MA [place] Pager [phone] Office [phone]  Lynann Beaver 02/02/2020, 2:19 PM

## 2020-02-02 NOTE — Progress Notes (Signed)
Renal Navigator witnessed patient on treatment in inpatient HD unit continually saying, "help me," in a flat, monotone voice without urgency. When RNs would attend to her, she wanted to get off treatment, or questioned her O2 sats (one RN put pulse-ox on her to appease), or asked to have BP cuff taken off. If she wasn't asking for help, she was asking if she could "go" or "get off."  Navigator notes that patient was dialyzed in a bed today. Navigator will re-visit possibility of getting patient to have HD treatment in a recliner at next treatment, but did not want to push her too hard today since she has been refusing treatment all together recently.  Renal Navigator following along.  Alphonzo Cruise, [place] Renal Navigator [phone]

## 2020-02-03 DIAGNOSIS — N179 Acute kidney failure, unspecified: Secondary | ICD-10-CM | POA: Diagnosis not present

## 2020-02-03 LAB — RENAL FUNCTION PANEL
Albumin: 2.6 g/dL — ABNORMAL LOW (ref 3.5–5.0)
Anion gap: 13 (ref 5–15)
BUN: 69 mg/dL — ABNORMAL HIGH (ref 8–23)
CO2: 29 mmol/L (ref 22–32)
Calcium: 8.4 mg/dL — ABNORMAL LOW (ref 8.9–10.3)
Chloride: 97 mmol/L — ABNORMAL LOW (ref 98–111)
Creatinine, Ser: 1.63 mg/dL — ABNORMAL HIGH (ref 0.44–1.00)
GFR calc Af Amer: 36 mL/min — ABNORMAL LOW (ref >60)
GFR calc non Af Amer: 31 mL/min — ABNORMAL LOW (ref >60)
Glucose, Bld: 142 mg/dL — ABNORMAL HIGH (ref 70–99)
Phosphorus: 3.9 mg/dL (ref 2.5–4.6)
Potassium: 4.1 mmol/L (ref 3.5–5.1)
Sodium: 139 mmol/L (ref 135–145)

## 2020-02-03 LAB — CBC
HCT: 28.5 % — ABNORMAL LOW (ref 36.0–46.0)
Hemoglobin: 8.4 g/dL — ABNORMAL LOW (ref 12.0–15.0)
MCH: 28.8 pg (ref 26.0–34.0)
MCHC: 29.5 g/dL — ABNORMAL LOW (ref 30.0–36.0)
MCV: 97.6 fL (ref 80.0–100.0)
Platelets: 148 10*3/uL — ABNORMAL LOW (ref 150–400)
RBC: 2.92 MIL/uL — ABNORMAL LOW (ref 3.87–5.11)
RDW: 17.8 % — ABNORMAL HIGH (ref 11.5–15.5)
WBC: 5.2 10*3/uL (ref 4.0–10.5)
nRBC: 0 % (ref 0.0–0.2)

## 2020-02-03 LAB — GLUCOSE, CAPILLARY
Glucose-Capillary: 137 mg/dL — ABNORMAL HIGH (ref 70–99)
Glucose-Capillary: 120 mg/dL — ABNORMAL HIGH (ref 70–99)
Glucose-Capillary: 131 mg/dL — ABNORMAL HIGH (ref 70–99)
Glucose-Capillary: 123 mg/dL — ABNORMAL HIGH (ref 70–99)
Glucose-Capillary: 155 mg/dL — ABNORMAL HIGH (ref 70–99)

## 2020-02-03 LAB — MAGNESIUM: Magnesium: 2.4 mg/dL (ref 1.7–2.4)

## 2020-02-03 MED ORDER — ONDANSETRON HCL 4 MG/2ML IJ SOLN
4.0000 mg | Freq: Three times a day (TID) | INTRAMUSCULAR | Status: DC | PRN
  Administered 2020-02-03 – 2020-02-22 (×15): 4 mg via INTRAVENOUS
  Filled 2020-02-03 (×16): qty 2

## 2020-02-03 MED ORDER — DARBEPOETIN ALFA 100 MCG/0.5ML IJ SOSY: 100.0000 ug | PREFILLED_SYRINGE | INTRAMUSCULAR | Status: DC

## 2020-02-03 MED ORDER — CHLORHEXIDINE GLUCONATE CLOTH 2 % EX PADS
6.0000 | MEDICATED_PAD | Freq: Every day | CUTANEOUS | Status: DC
  Administered 2020-02-03 – 2020-02-06 (×3): 6 via TOPICAL

## 2020-02-03 NOTE — Consult Note (Signed)
[facility]iat[facility] Consult   Reason for Consult:  "capacity" Referring Physician:  Dr. Maren Beach Patient Identification: Bailey Cox MRN:  [id number] Principal Diagnosis: <principal problem not specified> Diagnosis:  Active Problems:   Shock ([place])   Renal failure   Tracheostomy status ([place])   Chronic hypoxemic respiratory failure ([place])   Hypotension   Sepsis ([place])   Acute hypoxemic respiratory failure ([place])   Acute kidney injury ([place])   Anemia due to stage 4 chronic kidney disease ([place])   Abdominal pain   Palliative care encounter   Total Time spent with patient: 20 minutes  Subjective:   Bailey Cox is a 74 y.o. female patient.  Patient assessed by nurse practitioner.  Patient resting upon my approach, patient awakened upon hearing her name. Patient oriented to self only at this time.  Patient able to recall her own name and date of birth.  Patient appears confused regarding situation.  Patient reports she is currently at a "church in [place]." Patient continually calls out for "daughter, [name]." Patient unable to participate in interview at times, patient does not answer all evaluation questions when prompted.  Patient gives verbal consent to reach out to daughter, [name].  HPI: Patient admitted from [facility] with peripheral edema and acidosis.  Past Psychiatric History: NA  Risk to Self:  No Risk to Others:  No Prior Inpatient Therapy:  Unknown Prior Outpatient Therapy:  Unknown  Past Medical History:  Past Medical History:  Diagnosis Date  . Chronic respiratory failure ([place])   . CKD (chronic kidney disease)   . DM (diabetes mellitus) ([place])   . GERD (gastroesophageal reflux disease)   . HTN (hypertension)   . Ogilvie syndrome   . Tracheostomy status ([place])    2020  . Vascular dementia [facility])     Past Surgical History:  Procedure Laterality Date  . IR FLUORO GUIDE CV LINE RIGHT  01/10/2020  . IR US GUIDE VASC ACCESS RIGHT  01/10/2020    Family History: History reviewed. No pertinent family history. Family Psychiatric  History: Unknown Social History:  Social History   Substance and Sexual Activity  Alcohol Use None     Social History   Substance and Sexual Activity  Drug Use Not on file    Social History   Socioeconomic History  . Marital status: Single    Spouse name: Not on file  . Number of children: Not on file  . Years of education: Not on file  . Highest education level: Not on file  Occupational History  . Not on file  Tobacco Use  . Smoking status: Not on file  Substance and Sexual Activity  . Alcohol use: Not on file  . Drug use: Not on file  . Sexual activity: Not on file  Other Topics Concern  . Not on file  Social History Narrative  . Not on file   Social Determinants of Health   Financial Resource Strain:   . Difficulty of Paying Living Expenses:   Food Insecurity:   . Worried About Charity fundraiser in the Last Year:   . Arboriculturist in the Last Year:   Transportation Needs:   . Film/video editor (Medical):   Marland Kitchen Lack of Transportation (Non-Medical):   Physical Activity:   . Days of Exercise per Week:   . Minutes of Exercise per Session:   Stress:   . Feeling of Stress :   Social Connections:   . Frequency of Communication with Friends  and Family:   . Frequency of Social Gatherings with Friends and Family:   . Attends Religious Services:   . Active Member of Clubs or Organizations:   . Attends Archivist Meetings:   Marland Kitchen Marital Status:    Additional Social History:    Allergies:   Allergies  Allergen Reactions  . Drug Class [Clindamycin/Lincomycin] Hives  . Drug Ingredient [Cephalexin] Hives    Pt has tolerated Ceftriaxone, Cefepime, and Meropenem   . Nsaids Hives    Unsure of which agents  . Penicillins Hives    Pt has tolerated Ceftriaxone, Cefepime, and Meropenem   . Sulfa Antibiotics Hives  . Dilaudid [Hydromorphone Hcl] Other (See Comments)     Shortness of breath/confusion    Labs:  Results for orders placed or performed during the hospital encounter of 01/05/20 (from the past 48 hour(s))  Glucose, capillary     Status: Abnormal   Collection Time: 02/01/20 11:36 AM  Result Value Ref Range   Glucose-Capillary 149 (H) 70 - 99 mg/dL    Comment: Glucose reference range applies only to samples taken after fasting for at least 8 hours.  Glucose, capillary     Status: Abnormal   Collection Time: 02/01/20  5:54 PM  Result Value Ref Range   Glucose-Capillary 135 (H) 70 - 99 mg/dL    Comment: Glucose reference range applies only to samples taken after fasting for at least 8 hours.  Glucose, capillary     Status: Abnormal   Collection Time: 02/01/20  7:50 PM  Result Value Ref Range   Glucose-Capillary 128 (H) 70 - 99 mg/dL    Comment: Glucose reference range applies only to samples taken after fasting for at least 8 hours.  Glucose, capillary     Status: Abnormal   Collection Time: 02/01/20 11:56 PM  Result Value Ref Range   Glucose-Capillary 121 (H) 70 - 99 mg/dL    Comment: Glucose reference range applies only to samples taken after fasting for at least 8 hours.  Renal function panel (daily at 0500)     Status: Abnormal   Collection Time: 02/02/20  3:15 AM  Result Value Ref Range   Sodium 136 135 - 145 mmol/L   Potassium 4.6 3.5 - 5.1 mmol/L   Chloride 95 (L) 98 - 111 mmol/L   CO2 29 22 - 32 mmol/L   Glucose, Bld 98 70 - 99 mg/dL    Comment: Glucose reference range applies only to samples taken after fasting for at least 8 hours.   BUN 163 (H) 8 - 23 mg/dL   Creatinine, Ser 3.31 (H) 0.44 - 1.00 mg/dL   Calcium 8.5 (L) 8.9 - 10.3 mg/dL   Phosphorus 7.2 (H) 2.5 - 4.6 mg/dL   Albumin 2.5 (L) 3.5 - 5.0 g/dL   GFR calc non Af Amer 13 (L) >60 mL/min   GFR calc Af Amer 15 (L) >60 mL/min   Anion gap 12 5 - 15    Comment: Performed at [facility] [address] [place], [place] [address]  Magnesium     Status:  Abnormal   Collection Time: 02/02/20  3:15 AM  Result Value Ref Range   Magnesium 3.0 (H) 1.7 - 2.4 mg/dL    Comment: Performed at [facility] [address] [place], [place] [address]  CBC     Status: Abnormal   Collection Time: 02/02/20  3:15 AM  Result Value Ref Range   WBC 4.9 4.0 -  10.5 K/uL   RBC 2.70 (L) 3.87 - 5.11 MIL/uL   Hemoglobin 7.9 (L) 12.0 - 15.0 g/dL   HCT 26.1 (L) 36.0 - 46.0 %   MCV 96.7 80.0 - 100.0 fL   MCH 29.3 26.0 - 34.0 pg   MCHC 30.3 30.0 - 36.0 g/dL   RDW 17.5 (H) 11.5 - 15.5 %   Platelets 141 (L) 150 - 400 K/uL   nRBC 0.0 0.0 - 0.2 %    Comment: Performed at [facility] [address], [place], [place] [address]  Glucose, capillary     Status: Abnormal   Collection Time: 02/02/20  3:54 AM  Result Value Ref Range   Glucose-Capillary 101 (H) 70 - 99 mg/dL    Comment: Glucose reference range applies only to samples taken after fasting for at least 8 hours.  Glucose, capillary     Status: Abnormal   Collection Time: 02/02/20  7:40 AM  Result Value Ref Range   Glucose-Capillary 127 (H) 70 - 99 mg/dL    Comment: Glucose reference range applies only to samples taken after fasting for at least 8 hours.  Glucose, capillary     Status: Abnormal   Collection Time: 02/02/20 11:24 AM  Result Value Ref Range   Glucose-Capillary 154 (H) 70 - 99 mg/dL    Comment: Glucose reference range applies only to samples taken after fasting for at least 8 hours.  Glucose, capillary     Status: Abnormal   Collection Time: 02/02/20  6:16 PM  Result Value Ref Range   Glucose-Capillary 108 (H) 70 - 99 mg/dL    Comment: Glucose reference range applies only to samples taken after fasting for at least 8 hours.  Glucose, capillary     Status: Abnormal   Collection Time: 02/02/20  8:29 PM  Result Value Ref Range   Glucose-Capillary 134 (H) 70 - 99 mg/dL    Comment: Glucose reference range applies only to samples taken after fasting for at least 8 hours.  Glucose,  capillary     Status: Abnormal   Collection Time: 02/02/20 11:43 PM  Result Value Ref Range   Glucose-Capillary 155 (H) 70 - 99 mg/dL    Comment: Glucose reference range applies only to samples taken after fasting for at least 8 hours.  Magnesium     Status: None   Collection Time: 02/03/20  3:55 AM  Result Value Ref Range   Magnesium 2.4 1.7 - 2.4 mg/dL    Comment: Performed at [facility], [place] [address] [place], [place] [address]  CBC     Status: Abnormal   Collection Time: 02/03/20  3:55 AM  Result Value Ref Range   WBC 5.2 4.0 - 10.5 K/uL   RBC 2.92 (L) 3.87 - 5.11 MIL/uL   Hemoglobin 8.4 (L) 12.0 - 15.0 g/dL   HCT 28.5 (L) 36.0 - 46.0 %   MCV 97.6 80.0 - 100.0 fL   MCH 28.8 26.0 - 34.0 pg   MCHC 29.5 (L) 30.0 - 36.0 g/dL   RDW 17.8 (H) 11.5 - 15.5 %   Platelets 148 (L) 150 - 400 K/uL   nRBC 0.0 0.0 - 0.2 %    Comment: Performed at [facility], [place] [address], [place], [place] [address]  Renal function panel     Status: Abnormal   Collection Time: 02/03/20  3:55 AM  Result Value Ref Range   Sodium 139 135 - 145 mmol/L   Potassium 4.1  3.5 - 5.1 mmol/L   Chloride 97 (L) 98 - 111 mmol/L   CO2 29 22 - 32 mmol/L   Glucose, Bld 142 (H) 70 - 99 mg/dL    Comment: Glucose reference range applies only to samples taken after fasting for at least 8 hours.   BUN 69 (H) 8 - 23 mg/dL   Creatinine, Ser 1.63 (H) 0.44 - 1.00 mg/dL    Comment: DELTA CHECK NOTED   Calcium 8.4 (L) 8.9 - 10.3 mg/dL   Phosphorus 3.9 2.5 - 4.6 mg/dL   Albumin 2.6 (L) 3.5 - 5.0 g/dL   GFR calc non Af Amer 31 (L) >60 mL/min   GFR calc Af Amer 36 (L) >60 mL/min   Anion gap 13 5 - 15    Comment: Performed at [facility] [address] [place], [place] [address]  Glucose, capillary     Status: Abnormal   Collection Time: 02/03/20  4:36 AM  Result Value Ref Range   Glucose-Capillary 137 (H) 70 - 99 mg/dL    Comment: Glucose reference range applies only to samples taken after fasting for  at least 8 hours.  Glucose, capillary     Status: Abnormal   Collection Time: 02/03/20  7:56 AM  Result Value Ref Range   Glucose-Capillary 120 (H) 70 - 99 mg/dL    Comment: Glucose reference range applies only to samples taken after fasting for at least 8 hours.    Current Facility-Administered Medications  Medication Dose Route Frequency Provider Last Rate Last Admin  . B-complex with vitamin C tablet 1 tablet  1 tablet Per Tube Daily Raiford Noble [place], DO   1 tablet at 02/03/20 7846  . bisacodyl (DULCOLAX) suppository 10 mg  10 mg Rectal Daily Dellinger, Marianne L, PA-C   10 mg at 02/03/20 0835  . busPIRone (BUSPAR) tablet 5 mg  5 mg Oral BID Eugenie Filler, MD   5 mg at 02/03/20 9629  . chlorhexidine gluconate (MEDLINE KIT) (PERIDEX) 0.12 % solution 15 mL  15 mL Mouth Rinse BID Corey Harold, NP   15 mL at 02/03/20 0836  . Chlorhexidine Gluconate Cloth 2 % PADS 6 each  6 each Topical Daily Corey Harold, NP   6 each at 02/03/20 [phone]  . Chlorhexidine Gluconate Cloth 2 % PADS 6 each  6 each Topical Q0600 Corliss Parish, MD   6 each at 02/03/20 0510  . Chlorhexidine Gluconate Cloth 2 % PADS 6 each  6 each Topical Q0600 Edrick Oh, MD      . Darbepoetin Alfa (ARANESP) injection 100 mcg  100 mcg Intravenous Q Tue-HD Eugenie Filler, MD   100 mcg at 01/27/20 1014  . feeding supplement (NEPRO CARB STEADY) liquid 1,000 mL  1,000 mL Oral Q24H Eugenie Filler, MD 40 mL/hr at 02/03/20 0256 1,000 mL at 02/03/20 0256  . feeding supplement (PRO-STAT SUGAR FREE 64) liquid 30 mL  30 mL Per Tube TID Raiford Noble [place], DO   30 mL at 02/03/20 0834  . fentaNYL (SUBLIMAZE) injection 25-100 mcg  25-100 mcg Intravenous Q2H PRN Rigoberto Noel, MD   100 mcg at 02/01/20 2139  . guaiFENesin (ROBITUSSIN) 100 MG/5ML solution 200 mg  10 mL Oral BID Dellinger, Marianne L, PA-C   200 mg at 02/03/20 0835  . heparin injection 5,000 Units  5,000 Units Subcutaneous Q8H Margaretha Seeds, MD   5,000  Units at 02/03/20 0510  . insulin aspart (novoLOG) injection 0-15  Units  0-15 Units Subcutaneous Q4H Corey Harold, NP   2 Units at 02/03/20 4818  . MEDLINE mouth rinse  15 mL Mouth Rinse 10 times per day Corey Harold, NP   15 mL at 02/03/20 0841  . metoprolol tartrate (LOPRESSOR) injection 2.5-5 mg  2.5-5 mg Intravenous Q3H PRN Rigoberto Noel, MD   5 mg at 01/17/20 1603  . ondansetron (ZOFRAN) injection 4 mg  4 mg Intravenous Q8H PRN Lang Snow, FNP   4 mg at 02/03/20 0150  . pantoprazole sodium (PROTONIX) 40 mg/20 mL oral suspension 40 mg  40 mg Per Tube Q24H Chesley Mires, MD   40 mg at 02/02/20 1028  . PARoxetine (PAXIL) tablet 40 mg  40 mg Per Tube Daily Dellinger, Marianne L, PA-C   40 mg at 02/03/20 0835  . sodium chloride 0.9 % primer fluid for CRRT   CRRT PRN Audria Nine, DO      . sodium chloride flush (NS) 0.9 % injection 10-40 mL  10-40 mL Intracatheter Q12H Bailey Cocking, MD   10 mL at 02/02/20 1026  . sodium chloride flush (NS) 0.9 % injection 10-40 mL  10-40 mL Intracatheter PRN Bailey Cocking, MD   10 mL at 02/03/20 0836  . traZODone (DESYREL) tablet 25 mg  25 mg Per Tube QHS Margaretha Seeds, MD   25 mg at 02/02/20 2152    Musculoskeletal: Strength & Muscle Tone: Unable to assess Gait & Station: Unable to assess Patient leans: N/A  Psychiatric Specialty Exam: Physical Exam  Nursing note and vitals reviewed. Constitutional: She is oriented to person, place, and time. She appears well-developed.  HENT:  Head: Normocephalic.  Cardiovascular: Normal rate.  Respiratory: Effort normal.  Musculoskeletal:     Cervical back: Normal range of motion.  Neurological: She is alert and oriented to person, place, and time.  Psychiatric: She has a normal mood and affect. Her behavior is normal. Thought content normal. Her speech is delayed. Cognition and memory are impaired.    Review of Systems  Constitutional: Negative.   HENT: Negative.   Eyes: Negative.    Respiratory: Negative.   Cardiovascular: Negative.   Gastrointestinal: Negative.   Genitourinary: Negative.   Musculoskeletal: Negative.   Skin: Negative.   Neurological: Negative.     Blood pressure (!) 176/71, pulse 85, temperature 98.2 F (36.8 C), temperature source Oral, resp. rate 15, height _0  (1.626 m), weight 85.3 kg, SpO2 97 %.Body mass index is 32.28 kg/m.  General Appearance: Casual and Fairly Groomed  Eye Contact:  Minimal  Speech:  Slow  Volume:  Decreased  Mood:  Euthymic  Affect:  Congruent  Thought Process:  Coherent and Descriptions of Associations: Intact  Orientation:  Other:  Oriented to self only at this time.  Thought Content:  NA  Suicidal Thoughts:  Unable to assess  Homicidal Thoughts:  Unable to assess  Memory:  Immediate;   Poor  Judgement:  Impaired  Insight:  Lacking  Psychomotor Activity:  Normal  Concentration:  Concentration: Poor and Attention Span: Poor  Recall:  Poor  Fund of Knowledge:  Unable to assess  Language:  Good  Akathisia:  No  Handed:  Right  AIMS (if indicated):     Assets:  Financial Resources/Insurance Housing Social Support  ADL's: Unable to assess  Cognition:  Impaired,  Moderate  Sleep:        Treatment Plan Summary: Case discussed with Dr. Dwyane Dee  Psychiatry consulted regarding patient's  capacity. Currently patient oriented to self only.  Patient does not appear to have understanding of reasons for inpatient admission at this time.  Patient does not currently appear to understand her current condition or illness.  Patient does not currently understand the risks of refusing treatment. Currently patient does not appear to have capacity to participate in treatment or disposition planning.  Recommend contact next of kin or appropriate hospital administrator to help with decision-making process. Please consult psychiatry again if needed.  Disposition: No evidence of imminent risk to self or others at present.    Patient does not meet criteria for psychiatric inpatient admission.  Emmaline Kluver, FNP 02/03/2020 9:49 AM

## 2020-02-03 NOTE — Progress Notes (Signed)
Subjective:  Patient did receive dialysis yesterday, but was psychologically distressed by it  Psychiatry notes reviewed, patient felt to lack capacity for medical decision-making  Family wishes to continue providing dialysis.  Patient with very limited interaction on my exam today.   Objective: NAD, chronically ill-appearing, patient does not interact very much Regular, normal S1 and S2 Coarse breath sounds bilaterally Right IJ TDC Trace peripheral edema No obvious rashes.  Blood pressure (!) 176/71, pulse 85, temperature 98.2 F (36.8 C), temperature source Oral, resp. rate 15, height 5\' 4"  (1.626 m), weight 85.3 kg, SpO2 97 %.   Intake/Output Summary (Last 24 hours) at 02/03/2020 0953 Last data filed at 02/03/2020 0900 Gross per 24 hour  Intake 888.67 ml  Output 3200 ml  Net -2311.33 ml   BMP Latest Ref Rng & Units 02/03/2020 02/02/2020 02/01/2020  Glucose 70 - 99 mg/dL 142(H) 98 131(H)  BUN 8 - 23 mg/dL 69(H) 163(H) 145(H)  Creatinine 0.44 - 1.00 mg/dL 1.63(H) 3.31(H) 3.18(H)  Sodium 135 - 145 mmol/L 139 136 134(L)  Potassium 3.5 - 5.1 mmol/L 4.1 4.6 4.1  Chloride 98 - 111 mmol/L 97(L) 95(L) 94(L)  CO2 22 - 32 mmol/L 29 29 28   Calcium 8.9 - 10.3 mg/dL 8.4(L) 8.5(L) 8.6(L)   CBC Latest Ref Rng & Units 02/03/2020 02/02/2020 01/29/2020  WBC 4.0 - 10.5 K/uL 5.2 4.9 6.2  Hemoglobin 12.0 - 15.0 g/dL 8.4(L) 7.9(L) 8.2(L)  Hematocrit 36.0 - 46.0 % 28.5(L) 26.1(L) 27.2(L)  Platelets 150 - 400 K/uL 148(L) 141(L) 130(L)   Assessment & Plan: #Acute kidney Injury on chronic kidney disease stage IV: Suspected acute kidney injury is from sepsis/ATN. Previously on CRRT beginning 3/7 and later transitioned to intermittent hemodialysis after hemodynamic stability established. Oliguria and significant worsening of azotemia when dialysis is not provided demonstrate very low GFR and confirmed that she currently requires dialysis.  Palliative and psychiatry notes reviewed, difficult  situation regarding goals of care.  She does not have capacity based upon psychiatric evaluation on 3/30.  Continue to provide opportunity for this.  #Sepsis:  Suspected to be of urinary source but appears to have now resolved status post antibiotic treatment.  #History of PEA arrest status post tracheostomy  decannulated on 3/19 and without difficulty with oxygenation since.  #Anemia:  Secondary to chronic illness exacerbated by recent hospitalization-adequate iron stores and now on ESA. 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

## 2020-02-03 NOTE — Plan of Care (Signed)
  Problem: Education: Goal: Knowledge of General Education information will improve Description: Including pain rating scale, medication(s)/side effects and non-pharmacologic comfort measures Outcome: Progressing   Problem: Health Behavior/Discharge Planning: Goal: Ability to manage health-related needs will improve Outcome: Progressing   Problem: Clinical Measurements: Goal: Ability to maintain clinical measurements within normal limits will improve Outcome: Progressing Goal: Will remain free from infection Outcome: Progressing Goal: Diagnostic test results will improve Outcome: Progressing Goal: Respiratory complications will improve Outcome: Progressing Goal: Cardiovascular complication will be avoided Outcome: Progressing   Problem: Activity: Goal: Risk for activity intolerance will decrease Outcome: Progressing   Problem: Nutrition: Goal: Adequate nutrition will be maintained Outcome: Progressing   Problem: Coping: Goal: Level of anxiety will decrease Outcome: Progressing   Problem: Elimination: Goal: Will not experience complications related to bowel motility Outcome: Progressing Goal: Will not experience complications related to urinary retention Outcome: Progressing   Problem: Pain Managment: Goal: General experience of comfort will improve Outcome: Progressing   Problem: Safety: Goal: Ability to remain free from injury will improve Outcome: Progressing   Problem: Skin Integrity: Goal: Risk for impaired skin integrity will decrease Outcome: Progressing   Problem: Fluid Volume: Goal: Hemodynamic stability will improve Outcome: Progressing   Problem: Clinical Measurements: Goal: Diagnostic test results will improve Outcome: Progressing Goal: Signs and symptoms of infection will decrease Outcome: Progressing   Problem: Respiratory: Goal: Ability to maintain adequate ventilation will improve Outcome: Progressing   Problem: Education: Goal:  Knowledge of General Education information will improve Description: Including pain rating scale, medication(s)/side effects and non-pharmacologic comfort measures Outcome: Progressing   Problem: Health Behavior/Discharge Planning: Goal: Ability to manage health-related needs will improve Outcome: Progressing   Problem: Clinical Measurements: Goal: Ability to maintain clinical measurements within normal limits will improve Outcome: Progressing Goal: Will remain free from infection Outcome: Progressing Goal: Diagnostic test results will improve Outcome: Progressing Goal: Respiratory complications will improve Outcome: Progressing Goal: Cardiovascular complication will be avoided Outcome: Progressing   Problem: Activity: Goal: Risk for activity intolerance will decrease Outcome: Progressing   Problem: Nutrition: Goal: Adequate nutrition will be maintained Outcome: Progressing   Problem: Coping: Goal: Level of anxiety will decrease Outcome: Progressing   Problem: Elimination: Goal: Will not experience complications related to bowel motility Outcome: Progressing Goal: Will not experience complications related to urinary retention Outcome: Progressing   Problem: Pain Managment: Goal: General experience of comfort will improve Outcome: Progressing   Problem: Safety: Goal: Ability to remain free from injury will improve Outcome: Progressing   Problem: Skin Integrity: Goal: Risk for impaired skin integrity will decrease Outcome: Progressing

## 2020-02-03 NOTE — Progress Notes (Signed)
PROGRESS NOTE    Bailey Cox  JHE:[id number] DOB: March 03, 1946 DOA: 01/05/2020 PCP: Patient, No Pcp Per   Brief Narrative: Per chart review/previous attending:74 year old obese Caucasian female who was admitted on 01/05/2020 from [facility] where she had just arrived after a long admission to [facility] where she was hospitalized from 10/2019 up until 01/05/2020 where she suffered a PEA arrest during endoscopy and ended up requiring a tracheostomy. She presented to [facility] with widespread peripheral edema and acidosis.   Originally she was hospitalized in September 2020 with acute cholecystitis and cholecystectomy. 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 While hospitalized she developed fluid overload and ultimately transferred to [facility] for dialysis. Her clinical course after that point became somewhat unclear but family described that she was on a ventilator for several procedures and was eventually left intubated. 1 of those procedures and endoscopy which she unfortunately suffered a cardiac arrest. Again details of the cardiac arrest or not clear but she remained on the ventilator and ultimately underwent a tracheostomy. At that time her hospital course was also complicated by MRSA pneumonia. She is ultimately able to come off of hemodialysis and treated with intermittent diuresis. During her hospital stay she bounced in and out of the ICU for a few times with volume overload and at the end of her admission at [facility] she still struggled with kidney disease and was being treated for urinary tract infection with vancomycin and cefepime. Plans were stopped this on 3 2. She was discharged on the [facility] system on [facility] in [place] upon arrival to [facility] they deemed her too sick for admission transferred her directly to [facility] emergency department with complaints of volume  overload and acidosis. Since then she has been in ICU and has been off of the ventilator and now on trach collar. She underwent CRRT from 3 9 until 312 and transferred to the floor on 3/14 after she was tolerating hemodialysis. Currently nephrology has been consulted and they feel that she has an AKI on CKD most likely cause of her AKI being from ATN from sepsis and family still wants aggressive measures so she was on CRRT from 3 7 until 311 for clearance and also for anasarca and volume management. Her CRRT was stopped on 3 11 and Dr. Moshe Cipro suspected that she will likely require renal replacement therapy in the setting of intermittent hemodialysis. They feel that if she has a trach and is dialysis dependent the only option would be send her to LTAC. The plan is for intermittent hemodialysis schedule on TTS here via [facility]. Placement is likely going to be an issue because of this. Patient has been refusing dialysis and agreed for HD finally on 3/29.  Subjective:  Seen this morning, laying on the bed " I am not feeling well" No specific complaints. Overnight afebrile T-max 98.2 blood pressure 140s-170s Had dialysis yesterday but was persistently saying "help me" and "get me off" during dialysis on the bed yesterday. BUN/creatinine better 69/1.6 post HD.  Assessment & Plan:  AKI on CKD stage IV: Initially with AKI/ATN from sepsis.  Family desire renal replacement therapy and patient on intermittent HD -now HD TTS- s/p HD on 01/22/2020, 01/24/2020, 01/27/2020 but refused HD 3/25, 26, 27, 28 and agreed for HD 3/29- per nephro psych consulted to assess medical decision capacity. Palliative care also consulted.  Cont HD plan as per Npehrology. Recent  Labs  Lab 01/30/20 0431 01/31/20 0432 02/01/20 0440 02/02/20 0315 02/03/20 0355  BUN 104* 118* 145* 163* 69*  CREATININE 2.69* 3.03* 3.18* 3.31* 1.63*   MRSA pneumonia: Patient has completed antibiotics.Afebrile and wbc at 4.9  Chronic  respiratory failure, with tracheostomy- dencannulated 3/19/ history of PEA arrest: Had tracheostomy following PEA arrest previously in 2020- was on vent, off ventilator since 2/8.  Seen by pulmonary, speech therapy RT-tolerated capping 3/18 and 19 and status post decannulation.  Continue supportive measures.     T2DM, HbA1c stable 5.7  Sugar controlled on sliding scale insulin. Recent Labs  Lab 02/02/20 2029 02/02/20 2343 02/03/20 0436 02/03/20 0756 02/03/20 1153  GLUCAP 134* 155* 137* 120* 131*   Hypertension: BP controlled.  Anemia likely multifactorial, anemia of chronic kidney disease.  Continue Aranesp every Tuesday per nephrology. Recent Labs  Lab 01/28/20 0425 01/29/20 0340 02/02/20 0315 02/03/20 0355  HGB 8.3* 8.2* 7.9* 8.4*  HCT 26.9* 27.2* 26.1* 28.5*   Hyponatremia: Resolved.  Anxiety disorder: Continue Paxil,BuSpar  And trazodone.  GERD-on PPI  GOC:She is full code.She had palliative care discussion while she was in [place] per daughter. I Spoke with daughter-We discussed about palliative care follow along for [place] discussion.Daughter reports she has not been up for few months and has been in and out of hospital multiple times.She understands patient prognosis guarded.  We will continue to provide supportive care. Psych eval pending and palliative care also following.  Overall prognosis remains to be seen, but high risk for acute decompensation and readmission.  Nutrition: Speech following, keep n.p.o.x-ray and NAD 3/29, tolerating tube feed.  Dulcolax suppository as needed. Diet Order            Diet NPO time specified Except for: Ice Chips, Other (See Comments)  Diet effective now              Nutrition Problem: Inadequate oral intake Etiology: inability to eat Signs/Symptoms: NPO status Interventions: Tube feeding Body mass index is 32.28 kg/m.   DVT prophylaxis:Heparin Code Status: Full code Family Communication: plan of care discussed with  patient.  I had updated her daughter Katharine Look over the phone previously and multiple staffs nephrologist had also updated. Palliative had extensive discussion 3/29 w Katharine Look.  Disposition Plan: Patient came from: [facility] Anticipated d/c place:SNF Barriers to d/c OR conditions which need to be met to effect a safe d/c:Patient was in the ICU for 14 days status post tracheostomy required intermittent dialysis.  Patient has been refused for [facility] as well as select specialty LTAC and remains hospitalized for HD- once able to do HD on chair can start looking into SNF placement.  Consultants:  Nephrology: Dr. Augustin Coupe 01/06/2020  Wound care  Entered interventional radiology  Psychiatry  Palliative care  Procedures:see note PICC pre admit > Trach pre admit > 3/5 IJ vas cath->  Left upper extremity Doppler negative for DVT 01/19/2020 Echo 10/24/2019>LVEF 55%, Grade 1 DD. RV systolic function normal.  Echo 3/2: LVEF 65-70%, grade II dysfunction. RVSP.  Microbiology:see note Blood 3/1 >> ng Urine 3/1 >> ng resp 3/4 >>pan-sensitivepseudomonas   Medications: Scheduled Meds: . B-complex with vitamin C  1 tablet Per Tube Daily  . bisacodyl  10 mg Rectal Daily  . busPIRone  5 mg Oral BID  . chlorhexidine gluconate (MEDLINE KIT)  15 mL Mouth Rinse BID  . Chlorhexidine Gluconate Cloth  6 each Topical Daily  . Chlorhexidine Gluconate Cloth  6 each Topical Q0600  .  Chlorhexidine Gluconate Cloth  6 each Topical Q0600  . [START ON 02/04/2020] darbepoetin (ARANESP) injection - DIALYSIS  100 mcg Intravenous Q Wed-HD  . feeding supplement (NEPRO CARB STEADY)  1,000 mL Oral Q24H  . feeding supplement (PRO-STAT SUGAR FREE 64)  30 mL Per Tube TID  . guaiFENesin  10 mL Oral BID  . heparin  5,000 Units Subcutaneous Q8H  . insulin aspart  0-15 Units Subcutaneous Q4H  . mouth rinse  15 mL Mouth Rinse 10 times per day  . pantoprazole sodium  40 mg Per Tube Q24H  . PARoxetine  40 mg Per Tube  Daily  . sodium chloride flush  10-40 mL Intracatheter Q12H  . traZODone  25 mg Per Tube QHS   Continuous Infusions: . sodium chloride      Antimicrobials: Anti-infectives (From admission, onward)   Start     Dose/Rate Route Frequency Ordered Stop   01/10/20 0930  vancomycin (VANCOCIN) IVPB 1000 mg/200 mL premix  Status:  Discontinued     1,000 mg 200 mL/hr over 60 Minutes Intravenous  Once 01/10/20 0915 01/13/20 0623   01/06/20 2200  ceFEPIme (MAXIPIME) 2 g in sodium chloride 0.9 % 100 mL IVPB     2 g 200 mL/hr over 30 Minutes Intravenous Every 24 hours 01/05/20 2251 01/11/20 2234   01/05/20 2200  ceFEPIme (MAXIPIME) 2 g in sodium chloride 0.9 % 100 mL IVPB     2 g 200 mL/hr over 30 Minutes Intravenous  Once 01/05/20 2148 01/06/20 0015   01/05/20 2200  metroNIDAZOLE (FLAGYL) IVPB 500 mg     500 mg 100 mL/hr over 60 Minutes Intravenous  Once 01/05/20 2148 01/06/20 0015   01/05/20 2200  vancomycin (VANCOCIN) IVPB 1000 mg/200 mL premix  Status:  Discontinued     1,000 mg 200 mL/hr over 60 Minutes Intravenous  Once 01/05/20 2148 01/05/20 2151   01/05/20 2200  vancomycin (VANCOREADY) IVPB 2000 mg/400 mL  Status:  Discontinued     2,000 mg 200 mL/hr over 120 Minutes Intravenous  Once 01/05/20 2151 01/05/20 2223       Objective: Vitals: Today's Vitals   02/02/20 2034 02/02/20 2338 02/03/20 0444 02/03/20 0500  BP: (!) 175/73 (!) 158/70 (!) 176/71   Pulse: 84 86 85   Resp: '17 20 15   '$ Temp: 97.6 F (36.4 C) 98 F (36.7 C) 98.2 F (36.8 C)   TempSrc: Oral Oral Oral   SpO2: 90% 94% 97%   Weight:    85.3 kg  Height:      PainSc:        Intake/Output Summary (Last 24 hours) at 02/03/2020 1236 Last data filed at 02/03/2020 0900 Gross per 24 hour  Intake 888.67 ml  Output 3200 ml  Net -2311.33 ml   Filed Weights   02/02/20 1355 02/02/20 1733 02/03/20 0500  Weight: 88.7 kg 85.4 kg 85.3 kg   Weight change: 1.8 kg   Intake/Output from previous day: 03/29 0701 - 03/30  0700 In: 888.7 [P.O.:60; NG/GT:828.7] Out: 3200 [Urine:200] Intake/Output this shift: No intake/output data recorded.  Examination:  General exam: Alert awake oriented to self and reports she is on hospital HEENT:Oral mucosa moist, Ear/Nose WNL grossly,dentition normal.  Trach site decannulated with wound healing.   Respiratory system: bilaterally clear,no use of accessory muscle, non tender. Cardiovascular system: S1 & S2 +, regular, No JVD. Gastrointestinal system: Abdomen soft, mildly tender not distended, bowel sounds sluggish,  Nervous System:Alert, awake, oriented to self, place, people, situation.  PEG+ Extremities: No edema, distal peripheral pulses palpable.  Skin: No rashes,no icterus. MSK: Normal muscle bulk,tone, power HD catheter on rt chest  Data Reviewed: I have personally reviewed following labs and imaging studies CBC: Recent Labs  Lab 01/28/20 0425 01/29/20 0340 02/02/20 0315 02/03/20 0355  WBC  --  6.2 4.9 5.2  HGB 8.3* 8.2* 7.9* 8.4*  HCT 26.9* 27.2* 26.1* 28.5*  MCV  --  95.4 96.7 97.6  PLT  --  130* 141* 101*   Basic Metabolic Panel: Recent Labs  Lab 01/30/20 0431 01/31/20 0432 02/01/20 0440 02/02/20 0315 02/03/20 0355  NA 135 135 134* 136 139  K 3.9 4.0 4.1 4.6 4.1  CL 95* 96* 94* 95* 97*  CO2 '28 29 28 29 29  '$ GLUCOSE 113* 130* 131* 98 142*  BUN 104* 118* 145* 163* 69*  CREATININE 2.69* 3.03* 3.18* 3.31* 1.63*  CALCIUM 8.6* 8.5* 8.6* 8.5* 8.4*  MG 2.5* 2.8* 2.9* 3.0* 2.4  PHOS 6.0* 6.3* 6.9* 7.2* 3.9   GFR: Estimated Creatinine Clearance: 33.4 mL/min (A) (by C-G formula based on SCr of 1.63 mg/dL (H)). Liver Function Tests: Recent Labs  Lab 01/30/20 0431 01/31/20 0432 02/01/20 0440 02/02/20 0315 02/03/20 0355  ALBUMIN 2.5* 2.5* 2.5* 2.5* 2.6*   No results for input(s): LIPASE, AMYLASE in the last 168 hours. No results for input(s): AMMONIA in the last 168 hours. Coagulation Profile: No results for input(s): INR, PROTIME in the  last 168 hours. Cardiac Enzymes: No results for input(s): CKTOTAL, CKMB, CKMBINDEX, TROPONINI in the last 168 hours. BNP (last 3 results) No results for input(s): PROBNP in the last 8760 hours. HbA1C: No results for input(s): HGBA1C in the last 72 hours. CBG: Recent Labs  Lab 02/02/20 2029 02/02/20 2343 02/03/20 0436 02/03/20 0756 02/03/20 1153  GLUCAP 134* 155* 137* 120* 131*   Lipid Profile: No results for input(s): CHOL, HDL, LDLCALC, TRIG, CHOLHDL, LDLDIRECT in the last 72 hours. Thyroid Function Tests: No results for input(s): TSH, T4TOTAL, FREET4, T3FREE, THYROIDAB in the last 72 hours. Anemia Panel: No results for input(s): VITAMINB12, FOLATE, FERRITIN, TIBC, IRON, RETICCTPCT in the last 72 hours. Sepsis Labs: No results for input(s): PROCALCITON, LATICACIDVEN in the last 168 hours.  No results found for this or any previous visit (from the past 240 hour(s)).    Radiology Studies: DG Abd Portable 1V  Result Date: 02/02/2020 CLINICAL DATA:  Abdominal pain EXAM: PORTABLE ABDOMEN - 1 VIEW COMPARISON:  None. FINDINGS: Gastrostomy catheter tip is in the stomach region. There is moderate stool in the colon. There is no bowel dilatation or air-fluid level to suggest bowel obstruction. No free air. There are apparent phleboliths in the pelvis. IMPRESSION: Gastrostomy catheter in region of stomach. No evident bowel obstruction or free air. Electronically Signed   By: Lowella Grip III M.D.   On: 02/02/2020 11:48     LOS: 29 days   Time spent: More than 50% of that time was spent in counseling and/or coordination of care.  Antonieta Pert, MD [facility]  02/03/2020, 12:36 PM

## 2020-02-03 NOTE — Social Work (Addendum)
CSW acknowledging consult from psychiatry- "admitted from any facility." Pt recs currently for SNF- at this time ongoing [place] w/ pt family. As documented multiple times pt must be able to tolerate sitting in recliner for ongoing outpatient dialysis if that is what pt and pt family desires. Renal navigator Jaclyn Shaggy also aware and following.   Per psychiatry- "Patient does not currently understand the risks of refusing treatment. Currently patient does not appear to have capacity to participate in treatment or disposition planning.  Recommend contact next of kin or appropriate hospital administrator to help with decision-making process."  PMT and TOC team have been in conversation w/ pt next of kin including her daughter Katharine Look and granddaughter Autumn. Per PMT note, Katharine Look is going to provide team w/ paperwork indicating HCPOA/POA (unclear at this time).  Westley Hummer, MSW, [place] [employer]

## 2020-02-04 DIAGNOSIS — J9601 Acute respiratory failure with hypoxia: Secondary | ICD-10-CM | POA: Diagnosis not present

## 2020-02-04 DIAGNOSIS — R109 Unspecified abdominal pain: Secondary | ICD-10-CM | POA: Diagnosis not present

## 2020-02-04 DIAGNOSIS — N184 Chronic kidney disease, stage 4 (severe): Secondary | ICD-10-CM | POA: Diagnosis not present

## 2020-02-04 DIAGNOSIS — N179 Acute kidney failure, unspecified: Secondary | ICD-10-CM | POA: Diagnosis not present

## 2020-02-04 LAB — GLUCOSE, CAPILLARY
Glucose-Capillary: 101 mg/dL — ABNORMAL HIGH (ref 70–99)
Glucose-Capillary: 137 mg/dL — ABNORMAL HIGH (ref 70–99)
Glucose-Capillary: 139 mg/dL — ABNORMAL HIGH (ref 70–99)
Glucose-Capillary: 149 mg/dL — ABNORMAL HIGH (ref 70–99)
Glucose-Capillary: 150 mg/dL — ABNORMAL HIGH (ref 70–99)
Glucose-Capillary: 151 mg/dL — ABNORMAL HIGH (ref 70–99)
Glucose-Capillary: 161 mg/dL — ABNORMAL HIGH (ref 70–99)

## 2020-02-04 LAB — RENAL FUNCTION PANEL
Albumin: 2.6 g/dL — ABNORMAL LOW (ref 3.5–5.0)
Anion gap: 10 (ref 5–15)
BUN: 92 mg/dL — ABNORMAL HIGH (ref 8–23)
CO2: 30 mmol/L (ref 22–32)
Calcium: 8.7 mg/dL — ABNORMAL LOW (ref 8.9–10.3)
Chloride: 96 mmol/L — ABNORMAL LOW (ref 98–111)
Creatinine, Ser: 1.95 mg/dL — ABNORMAL HIGH (ref 0.44–1.00)
GFR calc Af Amer: 29 mL/min — ABNORMAL LOW (ref >60)
GFR calc non Af Amer: 25 mL/min — ABNORMAL LOW (ref >60)
Glucose, Bld: 146 mg/dL — ABNORMAL HIGH (ref 70–99)
Phosphorus: 5 mg/dL — ABNORMAL HIGH (ref 2.5–4.6)
Potassium: 4.3 mmol/L (ref 3.5–5.1)
Sodium: 136 mmol/L (ref 135–145)

## 2020-02-04 LAB — CBC
HCT: 28.9 % — ABNORMAL LOW (ref 36.0–46.0)
Hemoglobin: 8.6 g/dL — ABNORMAL LOW (ref 12.0–15.0)
MCH: 28.9 pg (ref 26.0–34.0)
MCHC: 29.8 g/dL — ABNORMAL LOW (ref 30.0–36.0)
MCV: 97 fL (ref 80.0–100.0)
Platelets: 141 10*3/uL — ABNORMAL LOW (ref 150–400)
RBC: 2.98 MIL/uL — ABNORMAL LOW (ref 3.87–5.11)
RDW: 17.4 % — ABNORMAL HIGH (ref 11.5–15.5)
WBC: 5.5 10*3/uL (ref 4.0–10.5)
nRBC: 0 % (ref 0.0–0.2)

## 2020-02-04 LAB — MAGNESIUM: Magnesium: 2.5 mg/dL — ABNORMAL HIGH (ref 1.7–2.4)

## 2020-02-04 MED ORDER — DARBEPOETIN ALFA 100 MCG/0.5ML IJ SOSY
100.0000 ug | PREFILLED_SYRINGE | INTRAMUSCULAR | Status: DC
  Administered 2020-02-04: 100 ug via SUBCUTANEOUS
  Filled 2020-02-04 (×2): qty 0.5

## 2020-02-04 MED ORDER — GENERIC EXTERNAL MEDICATION
Status: DC
Start: ? — End: 2020-02-04

## 2020-02-04 NOTE — Progress Notes (Addendum)
Subjective: Patient a little more alert on exam today, but limited interaction overall. States she feels poorly due to abdominal pain and nausea. Last dialysis on 3/29. Evaluation by psychiatry deemed patient to lack capacity for medical decision-making and Family wishes to continue providing dialysis. She denies headache, chest pain, palpitations or vomiting. Upon my interview, she is alert, I asked her if she was willing to go to HD today , she said no.  I told her that if she cont to refuse, she will get sick and die.  She said she understood but stays with her decision, says she is ready to die   Objective: Physical Exam  Constitutional: She is well-developed, well-nourished, and in no distress.  Delayed responses on exam  Cardiovascular: Normal rate and regular rhythm.  Pulmonary/Chest: Effort normal and breath sounds normal.  Abdominal: Soft. Bowel sounds are normal.  Epigastric discomfort   Musculoskeletal:        General: No edema.  Neurological: She is alert.  Skin: Skin is warm and dry.   Blood pressure (!) 155/65, pulse 82, temperature 99 F (37.2 C), temperature source Oral, resp. rate 16, height 5\' 4"  (1.626 m), weight 85.3 kg, SpO2 98 %.  Intake/Output Summary (Last 24 hours) at 02/04/2020 1133 Last data filed at 02/04/2020 0900 Gross per 24 hour  Intake 1040 ml  Output 500 ml  Net 540 ml   BMP Latest Ref Rng & Units 02/04/2020 02/03/2020 02/02/2020  Glucose 70 - 99 mg/dL 146(H) 142(H) 98  BUN 8 - 23 mg/dL 92(H) 69(H) 163(H)  Creatinine 0.44 - 1.00 mg/dL 1.95(H) 1.63(H) 3.31(H)  Sodium 135 - 145 mmol/L 136 139 136  Potassium 3.5 - 5.1 mmol/L 4.3 4.1 4.6  Chloride 98 - 111 mmol/L 96(L) 97(L) 95(L)  CO2 22 - 32 mmol/L 30 29 29   Calcium 8.9 - 10.3 mg/dL 8.7(L) 8.4(L) 8.5(L)   CBC Latest Ref Rng & Units 02/04/2020 02/03/2020 02/02/2020  WBC 4.0 - 10.5 K/uL 5.5 5.2 4.9  Hemoglobin 12.0 - 15.0 g/dL 8.6(L) 8.4(L) 7.9(L)  Hematocrit 36.0 - 46.0 % 28.9(L) 28.5(L) 26.1(L)   Platelets 150 - 400 K/uL 141(L) 148(L) 141(L)   Assessment & Plan: #Acute kidney Injury on chronic kidney disease stage IV: Suspected acute kidney injury is from sepsis/ATN. Previously on CRRT beginning 3/7 and later transitioned to intermittent hemodialysis after hemodynamic stability established. Oliguria and significant worsening of azotemia when dialysis is not provided demonstrate very low GFR and confirmed that she currently requires dialysis.  Palliative and psychiatry notes reviewed, difficult situation regarding goals of care.  She does not have capacity based upon psychiatric evaluation on 3/30.   -creatinine 1.95 (3/31) -BUN 92 -HD due today- patient refused.  Orders are written for HD- she is refusing.  This is not a stable situation where she would be able to be discharged from hospital   #Sepsis:  Suspected to be of urinary source but appears to have now resolved status post antibiotic treatment.  #History of PEA arrest status post tracheostomy  decannulated on 3/19 and without difficulty with oxygenation since>>3L [place]  #Anemia:  Secondary to chronic illness exacerbated by recent hospitalization-adequate iron stores and now on ESA. -Hgb 8.6 (3/31) -monitor daily CBC -maintain Hgb >7.0 -continue aranesp 100 mcg weekly on Tuesday with HD.  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 Very difficult social situation.  Is  dialysis dependent but refusing dialysis.  She lacks capacity and family wants to continue dialysis but patient is alert enough that we cannot drag her kicking and screaming to the HD unit.  Still quite anemic  Corliss Parish, MD 02/04/2020

## 2020-02-04 NOTE — Progress Notes (Signed)
Patient refusing dialysis.

## 2020-02-04 NOTE — Progress Notes (Signed)
PROGRESS NOTE    Bailey Cox  NTI:[id number] DOB: 23-Oct-1946 DOA: 01/05/2020 PCP: Patient, No Pcp Per   Brief Narrative:  HPI on 01/05/2020 by Mr. Georgann Housekeeper, NP (ICU) 74 year old female with PMH as below, which is significant for long admission in the [facility] system from 10/2019 all the way through 01/05/2020.  She was originally hospitalized in September 2020 with acute cholecystitis and underwent a cholecystectomy.  She was then sent to SNF for short duration but had return to [facility] to have a bile duct stent placed ultimately returning to SNF.  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  While hospitalized she developed fluid overload and was ultimately transferred to [facility] for dialysis.  Her course after that point become somewhat unclear, but as described by her family she was on and off the ventilator for several procedures and was eventually left intubated.  One of these procedures was an endoscopy during which she unfortunately suffered a cardiac arrest.  Details of the arrest are unclear.  She remained on the ventilator and ultimately underwent tracheostomy.  Her hospital course was also complicated by MRSA pneumonia.  She was ultimately able to come off of hemodialysis and was treated intermittently with diuresis.  It sounds like she bounced in and out of the ICU a few times with volume overload.  On the tail end of her admission she still struggled with kidney disease and was being treated for a urinary tract infection with cefepime and vancomycin.  Plans were to stop on 3/2.  She was discharged from the [facility] system on 3/1 to [facility] in [place].  Upon arrival to [facility] they deemed her too sick for admission and transferred her to [facility] emergency department with complaints of volume overload and acidosis.   Interim history Initially admitted by ICU this patient was ventilator dependent, into trach, which  is now been removed.  Patient started CRRT on 01/13/2020 until 01/16/2020 and transferred to the floor on 01/18/2020 as she was tolerating hemodialysis.  Nephrology has been seeing her and feels that she has AKI on CKD most likely being from ATN/sepsis.  Patient family wants aggressive measures.  CRRT was discontinued on 01/15/2020.  Patient now has [facility] and is scheduled for intermittent dialysis.  She refused to go to dialysis today. 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  However patient refused dialysis on 02/04/2020 -Psychiatry consulted for capacity, patient does not have capacity to make her own decisions -Palliative care also consulted  MRSA pneumonia -Completed antibiotics  Chronic respiratory failure -Patient did have tracheostomy however was decannulated on 01/23/2020 -Continue supportive measures -Currently on 3L  Diabetes mellitus, type II -Hemoglobin A1c 5.7 -Continue insulin sliding scale CBG monitoring  Essential hypertension -Continue PRN metoprolol  Anemia of chronic kidney disease -Continue Aranesp -Monitor CBC  Hyponatremia -Resolved  Anxiety/depression -Continue Paxil, BuSpar, trazodone  GERD -Continue PPI  Goals of care -Palliative care consulted -Of note palliative care discussion was had with patient was at [facility].  Previous hospitalist discussed with her daughter, and understands prognosis is guarded. -Psychiatry consulted for capacity, patient does not have capacity to make her own decisions  Nutritional support -She does not want to eat -currently has peg tube and on Tube feeds  DVT Prophylaxis  Heparin  Code Status: Full  Family Communication: None at bedside  Disposition Plan: Admitted from [place]. Now with renal failure needing HD, but continues to refuse. Dispo  TBD. LTAC has declined patient. Disposition pending patient's ability to dialyze in a chair.  Consultants Nephrology PCCM Psychiatry Palliative care Interventional radiology  Procedures  Echcoardiogram 01/06/2020 shows an EF of 65 to 48%, grade 2 diastolic dysfunction.  RVSP.  Antibiotics   Anti-infectives (From admission, onward)   Start     Dose/Rate Route Frequency Ordered Stop   01/10/20 0930  vancomycin (VANCOCIN) IVPB 1000 mg/200 mL premix  Status:  Discontinued     1,000 mg 200 mL/hr over 60 Minutes Intravenous  Once 01/10/20 0915 01/13/20 0623   01/06/20 2200  ceFEPIme (MAXIPIME) 2 g in sodium chloride 0.9 % 100 mL IVPB     2 g 200 mL/hr over 30 Minutes Intravenous Every 24 hours 01/05/20 2251 01/11/20 2234   01/05/20 2200  ceFEPIme (MAXIPIME) 2 g in sodium chloride 0.9 % 100 mL IVPB     2 g 200 mL/hr over 30 Minutes Intravenous  Once 01/05/20 2148 01/06/20 0015   01/05/20 2200  metroNIDAZOLE (FLAGYL) IVPB 500 mg     500 mg 100 mL/hr over 60 Minutes Intravenous  Once 01/05/20 2148 01/06/20 0015   01/05/20 2200  vancomycin (VANCOCIN) IVPB 1000 mg/200 mL premix  Status:  Discontinued     1,000 mg 200 mL/hr over 60 Minutes Intravenous  Once 01/05/20 2148 01/05/20 2151   01/05/20 2200  vancomycin (VANCOREADY) IVPB 2000 mg/400 mL  Status:  Discontinued     2,000 mg 200 mL/hr over 120 Minutes Intravenous  Once 01/05/20 2151 01/05/20 2223      Subjective:   Bailey Cox seen and examined today.  Patient does not wish to interact with me this morning. Objective:   Vitals:   02/03/20 2100 02/04/20 0432 02/04/20 0500 02/04/20 0537  BP: (!) 158/63 (!) 173/60  (!) 155/65  Pulse: 86 83  82  Resp:  16    Temp:  99 F (37.2 C)    TempSrc:  Oral    SpO2:  98%    Weight:   85.3 kg   Height:        Intake/Output Summary (Last 24 hours) at 02/04/2020 1325 Last data filed at 02/04/2020 0900 Gross per 24 hour  Intake 1040 ml  Output 500 ml  Net 540 ml   Filed Weights     02/02/20 1733 02/03/20 0500 02/04/20 0500  Weight: 85.4 kg 85.3 kg 85.3 kg    Exam  General: Well developed, chronically ill-appearing, NAD  HEENT: NCAT, mucous membranes moist.   Neck: Dressing in place at trach site  Cardiovascular: S1 S2 auscultated, RRR, no murmur  Respiratory: Clear to auscultation bilaterally   Abdomen: Soft, nontender, nondistended, + bowel sounds, +peg  Extremities: warm dry without cyanosis clubbing or edema  Neuro: Awake, cannot fully assess  Psych: Not interacting with me today.   Data Reviewed: I have personally reviewed following labs and imaging studies  CBC: Recent Labs  Lab 01/29/20 0340 02/02/20 0315 02/03/20 0355 02/04/20 0308  WBC 6.2 4.9 5.2 5.5  HGB 8.2* 7.9* 8.4* 8.6*  HCT 27.2* 26.1* 28.5* 28.9*  MCV 95.4 96.7 97.6 97.0  PLT 130* 141* 148* 270*   Basic Metabolic Panel: Recent Labs  Lab 01/31/20 0432 02/01/20 0440 02/02/20 0315 02/03/20 0355 02/04/20 0308  NA 135 134* 136 139 136  K 4.0 4.1 4.6 4.1 4.3  CL 96* 94* 95* 97* 96*  CO2 '29 28 29 29 30  '$ GLUCOSE 130* 131* 98 142* 146*  BUN 118* 145* 163* 69* 92*  CREATININE 3.03* 3.18* 3.31* 1.63* 1.95*  CALCIUM 8.5* 8.6* 8.5* 8.4* 8.7*  MG 2.8* 2.9* 3.0* 2.4 2.5*  PHOS 6.3* 6.9* 7.2* 3.9 5.0*   GFR: Estimated Creatinine Clearance: 27.9 mL/min (A) (by C-G formula based on SCr of 1.95 mg/dL (H)). Liver Function Tests: Recent Labs  Lab 01/31/20 0432 02/01/20 0440 02/02/20 0315 02/03/20 0355 02/04/20 0308  ALBUMIN 2.5* 2.5* 2.5* 2.6* 2.6*   No results for input(s): LIPASE, AMYLASE in the last 168 hours. No results for input(s): AMMONIA in the last 168 hours. Coagulation Profile: No results for input(s): INR, PROTIME in the last 168 hours. Cardiac Enzymes: No results for input(s): CKTOTAL, CKMB, CKMBINDEX, TROPONINI in the last 168 hours. BNP (last 3 results) No results for input(s): PROBNP in the last 8760 hours. HbA1C: No results for input(s): HGBA1C in  the last 72 hours. CBG: Recent Labs  Lab 02/03/20 2013 02/04/20 0013 02/04/20 0421 02/04/20 0731 02/04/20 1208  GLUCAP 155* 139* 137* 101* 151*   Lipid Profile: No results for input(s): CHOL, HDL, LDLCALC, TRIG, CHOLHDL, LDLDIRECT in the last 72 hours. Thyroid Function Tests: No results for input(s): TSH, T4TOTAL, FREET4, T3FREE, THYROIDAB in the last 72 hours. Anemia Panel: No results for input(s): VITAMINB12, FOLATE, FERRITIN, TIBC, IRON, RETICCTPCT in the last 72 hours. Urine analysis:    Component Value Date/Time   COLORURINE AMBER (A) 01/13/2020 1302   APPEARANCEUR TURBID (A) 01/13/2020 1302   LABSPEC 1.018 01/13/2020 1302   PHURINE 5.0 01/13/2020 1302   GLUCOSEU NEGATIVE 01/13/2020 1302   HGBUR SMALL (A) 01/13/2020 1302   BILIRUBINUR NEGATIVE 01/13/2020 1302   KETONESUR NEGATIVE 01/13/2020 1302   PROTEINUR >=300 (A) 01/13/2020 1302   NITRITE NEGATIVE 01/13/2020 1302   LEUKOCYTESUR LARGE (A) 01/13/2020 1302   Sepsis Labs: '@LABRCNTIP'$ (procalcitonin:4,lacticidven:4)  )No results found for this or any previous visit (from the past 240 hour(s)).    Radiology Studies: No results found.   Scheduled Meds: . B-complex with vitamin C  1 tablet Per Tube Daily  . bisacodyl  10 mg Rectal Daily  . busPIRone  5 mg Oral BID  . chlorhexidine gluconate (MEDLINE KIT)  15 mL Mouth Rinse BID  . Chlorhexidine Gluconate Cloth  6 each Topical Daily  . Chlorhexidine Gluconate Cloth  6 each Topical Q0600  . Chlorhexidine Gluconate Cloth  6 each Topical Q0600  . darbepoetin (ARANESP) injection - DIALYSIS  100 mcg Intravenous Q Wed-HD  . feeding supplement (NEPRO CARB STEADY)  1,000 mL Oral Q24H  . feeding supplement (PRO-STAT SUGAR FREE 64)  30 mL Per Tube TID  . guaiFENesin  10 mL Oral BID  . heparin  5,000 Units Subcutaneous Q8H  . insulin aspart  0-15 Units Subcutaneous Q4H  . mouth rinse  15 mL Mouth Rinse 10 times per day  . pantoprazole sodium  40 mg Per Tube Q24H  .  PARoxetine  40 mg Per Tube Daily  . sodium chloride flush  10-40 mL Intracatheter Q12H  . traZODone  25 mg Per Tube QHS   Continuous Infusions: . sodium chloride       LOS: 30 days   Time Spent in minutes   45 minutes  Ziare Orrick D.O. on 02/04/2020 at 1:25 PM  Between 7am to 7pm - Please see pager noted on [email or web address]  After 7pm go to [email or web address]  And look for the night coverage person covering for me after hours  [facility] Office  [phone]

## 2020-02-04 NOTE — Progress Notes (Signed)
Nutrition Follow-up  DOCUMENTATION CODES:   Morbid obesity  INTERVENTION:   ContinueNepro@ 39m/hr via PEG  325mProstat TID.   Tube feeding regimen provides2028kcal (100% of needs),123grams of protein, and 69833mf H2O.  NUTRITION DIAGNOSIS:   Inadequate oral intake related to inability to eat as evidenced by NPO status.  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 RD did not disturb.   Per SLP notes, pt more interactive in last assessment on 02/02/20. Plan remains to offer liquids. She remains dependent on TF: Nepro @ 40 ml/hr with 30 ml Prostat TID; regimen provides 2028 kcals, 123 grams protein, and 698 ml weater daily, meeting 100% of estimated kcal and protein needs.   Pt has been refusing HD. Per psychiatry notes, pt does not have decision making capacity. Family wishes to continue HD at this time.   Labs reviewed: K WDL. Phos: 5, Mg: 2.5,CBGS: 101-151 (inpatient orders for glycemic control are 0-15 units insulin aspart every 4 hours).   Diet Order:   Diet Order            Diet NPO time specified Except  for: Ice Chips, Other (See Comments)  Diet effective now              EDUCATION NEEDS:   No education needs have been identified at this time  Skin:  Skin Assessment: Reviewed RN Assessment(L groin: open wound/MASD)  Last BM:  02/01/20  Height:   Ht Readings from Last 1 Encounters:  01/05/20 _0  (1.626 m)    Weight:   Wt Readings from Last 1 Encounters:  02/04/20 85.3 kg    Ideal Body Weight:  54.5 kg  BMI:  Body mass index is 32.28 kg/m.  Estimated Nutritional Needs:   Kcal:  1700-1900  Protein:  110-136 grams  Fluid:  >1.5 L/day    JenLoistine ChanceD, LDN, CDCCadottgistered Dietitian II Certified Diabetes Care and Education Specialist Please refer to [facility] and/or RD on-call/weekend/after hours pager

## 2020-02-04 NOTE — Care Management Important Message (Signed)
Important Message  Patient Details  Name: Bailey Cox MRN: [id number] Date of Birth: February 29, 1946   Medicare Important Message Given:  Yes     Memory Argue 02/04/2020, 2:17 PM

## 2020-02-05 DIAGNOSIS — J9601 Acute respiratory failure with hypoxia: Secondary | ICD-10-CM | POA: Diagnosis not present

## 2020-02-05 DIAGNOSIS — N184 Chronic kidney disease, stage 4 (severe): Secondary | ICD-10-CM | POA: Diagnosis not present

## 2020-02-05 DIAGNOSIS — I959 Hypotension, unspecified: Secondary | ICD-10-CM

## 2020-02-05 DIAGNOSIS — N179 Acute kidney failure, unspecified: Secondary | ICD-10-CM | POA: Diagnosis not present

## 2020-02-05 DIAGNOSIS — R109 Unspecified abdominal pain: Secondary | ICD-10-CM | POA: Diagnosis not present

## 2020-02-05 LAB — MAGNESIUM: Magnesium: 2.7 mg/dL — ABNORMAL HIGH (ref 1.7–2.4)

## 2020-02-05 LAB — CBC
HCT: 28 % — ABNORMAL LOW (ref 36.0–46.0)
Hemoglobin: 8.3 g/dL — ABNORMAL LOW (ref 12.0–15.0)
MCH: 28.4 pg (ref 26.0–34.0)
MCHC: 29.6 g/dL — ABNORMAL LOW (ref 30.0–36.0)
MCV: 95.9 fL (ref 80.0–100.0)
Platelets: 143 10*3/uL — ABNORMAL LOW (ref 150–400)
RBC: 2.92 MIL/uL — ABNORMAL LOW (ref 3.87–5.11)
RDW: 16.7 % — ABNORMAL HIGH (ref 11.5–15.5)
WBC: 6.3 10*3/uL (ref 4.0–10.5)
nRBC: 0 % (ref 0.0–0.2)

## 2020-02-05 LAB — RENAL FUNCTION PANEL
Albumin: 2.5 g/dL — ABNORMAL LOW (ref 3.5–5.0)
Anion gap: 14 (ref 5–15)
BUN: 119 mg/dL — ABNORMAL HIGH (ref 8–23)
CO2: 28 mmol/L (ref 22–32)
Calcium: 8.7 mg/dL — ABNORMAL LOW (ref 8.9–10.3)
Chloride: 94 mmol/L — ABNORMAL LOW (ref 98–111)
Creatinine, Ser: 2.17 mg/dL — ABNORMAL HIGH (ref 0.44–1.00)
GFR calc Af Amer: 26 mL/min — ABNORMAL LOW (ref >60)
GFR calc non Af Amer: 22 mL/min — ABNORMAL LOW (ref >60)
Glucose, Bld: 160 mg/dL — ABNORMAL HIGH (ref 70–99)
Phosphorus: 5.7 mg/dL — ABNORMAL HIGH (ref 2.5–4.6)
Potassium: 4.4 mmol/L (ref 3.5–5.1)
Sodium: 136 mmol/L (ref 135–145)

## 2020-02-05 LAB — GLUCOSE, CAPILLARY
Glucose-Capillary: 126 mg/dL — ABNORMAL HIGH (ref 70–99)
Glucose-Capillary: 127 mg/dL — ABNORMAL HIGH (ref 70–99)
Glucose-Capillary: 136 mg/dL — ABNORMAL HIGH (ref 70–99)
Glucose-Capillary: 139 mg/dL — ABNORMAL HIGH (ref 70–99)
Glucose-Capillary: 150 mg/dL — ABNORMAL HIGH (ref 70–99)
Glucose-Capillary: 153 mg/dL — ABNORMAL HIGH (ref 70–99)

## 2020-02-05 MED ORDER — GUAIFENESIN 100 MG/5ML PO SOLN
10.0000 mL | Freq: Two times a day (BID) | ORAL | Status: DC
  Administered 2020-02-05 – 2020-02-16 (×22): 200 mg
  Filled 2020-02-05 (×23): qty 10

## 2020-02-05 MED ORDER — BUSPIRONE HCL 5 MG PO TABS
5.0000 mg | ORAL_TABLET | Freq: Two times a day (BID) | ORAL | Status: DC
  Administered 2020-02-05 – 2020-03-21 (×89): 5 mg
  Filled 2020-02-05 (×90): qty 1

## 2020-02-05 NOTE — Progress Notes (Signed)
Encouraged PO intake throughout the day. Patient took minimal sips of water, refused other options.

## 2020-02-05 NOTE — Progress Notes (Signed)
Subjective: Patient resting in bed, but alert on exam today, limited interaction overall due to patient desire to rest. She is without overt complaints. Last dialysis on 3/29. Evaluation by psychiatry deemed patient to lack capacity for medical decision-making and Family wishes to continue providing dialysis. She denies headache, abdominal pain, nausea, chest pain, palpitations or vomiting. She is unwilling to undergo dialysis treatment today.   Upon my interview, she is awake and alert, I asked her if she was willing to go to HD today , she said no.  She endorses nausea and when I explain dialysis could help alleviate that symptoms she continues to decline dialysis. Asks for [employer]. Family coming tomorrow.   Objective: Physical Exam  Constitutional: She is well-developed, well-nourished, and in no distress.  Delayed responses on exam  Cardiovascular: Normal rate and regular rhythm.  Pulmonary/Chest: Effort normal and breath sounds normal.  Abdominal: Soft. Bowel sounds are normal.  Musculoskeletal:        General: No edema.  Neurological: She is alert.  Skin: Skin is warm and dry.  awake and alert, no edema, normal WOB, clear to bases  Blood pressure (!) 155/65, pulse 82, temperature 99 F (37.2 C), temperature source Oral, resp. rate 16, height 5\' 4"  (1.626 m), weight 85.3 kg, SpO2 98 %.  Intake/Output Summary (Last 24 hours) at 02/05/2020 1004 Last data filed at 02/05/2020 0129 Gross per 24 hour  Intake 180 ml  Output 751 ml  Net -571 ml   BMP Latest Ref Rng & Units 02/05/2020 02/04/2020 02/03/2020  Glucose 70 - 99 mg/dL 160(H) 146(H) 142(H)  BUN 8 - 23 mg/dL 119(H) 92(H) 69(H)  Creatinine 0.44 - 1.00 mg/dL 2.17(H) 1.95(H) 1.63(H)  Sodium 135 - 145 mmol/L 136 136 139  Potassium 3.5 - 5.1 mmol/L 4.4 4.3 4.1  Chloride 98 - 111 mmol/L 94(L) 96(L) 97(L)  CO2 22 - 32 mmol/L 28 30 29   Calcium 8.9 - 10.3 mg/dL 8.7(L) 8.7(L) 8.4(L)   CBC Latest Ref Rng & Units 02/05/2020 02/04/2020 02/03/2020   WBC 4.0 - 10.5 K/uL 6.3 5.5 5.2  Hemoglobin 12.0 - 15.0 g/dL 8.3(L) 8.6(L) 8.4(L)  Hematocrit 36.0 - 46.0 % 28.0(L) 28.9(L) 28.5(L)  Platelets 150 - 400 K/uL 143(L) 141(L) 148(L)   Assessment & Plan: #Acute kidney Injury on chronic kidney disease stage IV: Suspected acute kidney injury is from sepsis/ATN. Previously on CRRT beginning 3/7 and later transitioned to intermittent hemodialysis after hemodynamic stability established. Oliguria and significant worsening of azotemia when dialysis is not provided demonstrate very low GFR and confirmed that she currently requires dialysis.  Palliative and psychiatry notes reviewed, difficult situation regarding goals of care.  She does not have capacity based upon psychiatric evaluation on 3/30.   -creatinine 2.17<<1.95 (3/31) -BUN 119<< 92(3/31) -HD due 3/31- patient refused.  Orders are written for HD- she is refusing today.  This is not a stable situation where she would be able to be discharged from hospital. While she is without capacity, patient alert enough that forced dialysis is not an option at this time.  #Sepsis:  Suspected to be of urinary source but appears to have now resolved status post antibiotic treatment.  #History of PEA arrest status post tracheostomy  decannulated on 3/19 and without difficulty with oxygenation since>>3L [place]  #Anemia:  Secondary to chronic illness exacerbated by recent hospitalization-adequate iron stores and now on ESA. -Hgb 8.3<<8.6 (3/31) -monitor daily CBC -maintain Hgb >7.0 -continue aranesp 100 mcg weekly on Tuesday with HD.  Iron  stores were adequate on 3/9  CKDBMD:  -Corrected Calcium 9.8  -Phosphorus 5.0 No binder   Patient seen and examined, agree with above note with above modifications. Very difficult social situation.  Is dialysis dependent but refusing dialysis still.  I asked her to discuss with her family tomorrow.  There is a family meeting planned as well.     Jannifer Hick  MD [facility] Pager [phone]

## 2020-02-05 NOTE — TOC Progression Note (Addendum)
Transition of Care [facility]) - Progression Note    Patient Details  Name: Sherran Margolis MRN: [id number] Date of Birth: 1946/06/03  Transition of Care [facility]) CM/SW [place], [place] Phone Number: 02/05/2020, 11:12 AM  Clinical Narrative:    12:10pm- CSW called Katharine Look back at [phone], she is aware of pt refusal for dialysis yesterday and today and that our providers would like to discuss next steps for pt and pt family. Katharine Look is aware that in order for pt to be medically stable for d/c that pt would need to be accepting of dialysis and tolerate sitting in a chair. Pt daughter open to speaking w/ providers tomorrow when she comes. Nephrology aware, PMT will be notified. CSW will also notify hospitalist.   11:48am- CSW reached out to [place] at [phone]. Introduced self, role, reason for call. CSW shared that we were hoping their might be a time that she could come down and meet with care team and pt to discuss further goals of care. CSW floated possibly next Tuesday. Pt daughter has taken tomorrow off work and was planning on coming down to [place]. She lives about 3 hours away and states it would be around 11 or so. Otherwise she is asking about video conferencing etc. CSW explained I would reach out to providers and see what they would be able to do. Pt daughter also states she has faxed paperwork to [facility] fax, CSW will check machine and chart.   11:12am- CSW spoke with Terri Piedra, LCSW, renal navigator. Aware Ethics Committee has been contacted and has recommended a family meeting early next week tod discuss next steps and goals of care. Pt will need to be able to sit at dialysis for outpatient treatment/SNF placement. However given pt refusal for dialysis treatment and discussion w/ nephrology team as documented on 4/1, it is important that further goals of care are discussed, in person if possible, w/ medical care team and pt family.   PMT also notified by renal navigator, CSW  remains available.    Expected Discharge Plan: Skilled Nursing Facility Barriers to Discharge: Continued Medical Work up, Other (comment)(ability to tolerate dialysis in recliner)  Expected Discharge Plan and Services Expected Discharge Plan: [place] In-house Referral: Clinical Social Work Discharge Planning Services: CM Consult Post Acute Care Choice: [place] Living arrangements for the past 2 months: [place]                   Readmission Risk Interventions Readmission Risk Prevention Plan 01/28/2020  Transportation Screening Complete  PCP or Specialist Appt within 3-5 Days Not Complete  Not Complete comments plan for SNF  HRI or [place] Not Complete  HRI or Home Care Consult comments plan for SNF  Social Work Consult for [place] Planning/Counseling Complete  Palliative Care Screening Complete  Medication Review Press photographer) Referral to Pharmacy  PCP or Specialist appointment within 3-5 days of discharge Not Complete  PCP/Specialist Appt Not Complete comments plan for SNF  HRI or Home Care Consult Complete  SW Recovery Care/Counseling Consult Complete  Palliative Care Screening Not Complete  Comments appropriate at this time  [place] Complete

## 2020-02-05 NOTE — Progress Notes (Signed)
PROGRESS NOTE    Bailey Cox  DSK:[id number] DOB: 07/19/1946 DOA: 01/05/2020 PCP: Patient, No Pcp Per   Brief Narrative:  HPI on 01/05/2020 by Mr. Georgann Housekeeper, NP (ICU) 74 year old female with PMH as below, which is significant for long admission in the [facility] system from 10/2019 all the way through 01/05/2020.  She was originally hospitalized in September 2020 with acute cholecystitis and underwent a cholecystectomy.  She was then sent to SNF for short duration but had return to [facility] to have a bile duct stent placed ultimately returning to SNF.  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  While hospitalized she developed fluid overload and was ultimately transferred to [facility] for dialysis.  Her course after that point become somewhat unclear, but as described by her family she was on and off the ventilator for several procedures and was eventually left intubated.  One of these procedures was an endoscopy during which she unfortunately suffered a cardiac arrest.  Details of the arrest are unclear.  She remained on the ventilator and ultimately underwent tracheostomy.  Her hospital course was also complicated by MRSA pneumonia.  She was ultimately able to come off of hemodialysis and was treated intermittently with diuresis.  It sounds like she bounced in and out of the ICU a few times with volume overload.  On the tail end of her admission she still struggled with kidney disease and was being treated for a urinary tract infection with cefepime and vancomycin.  Plans were to stop on 3/2.  She was discharged from the [facility] system on 3/1 to [facility] in [place].  Upon arrival to [facility] they deemed her too sick for admission and transferred her to [facility] emergency department with complaints of volume overload and acidosis.   Interim history Initially admitted by ICU this patient was ventilator dependent, into trach, which  is now been removed.  Patient started CRRT on 01/13/2020 until 01/16/2020 and transferred to the floor on 01/18/2020 as she was tolerating hemodialysis.  Nephrology has been seeing her and feels that she has AKI on CKD most likely being from ATN/sepsis.  Patient family wants aggressive measures.  CRRT was discontinued on 01/15/2020.  Patient now has [facility] and is scheduled for intermittent dialysis.  She refused to go to dialysis today. 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  However patient refused dialysis on 02/04/2020 -Psychiatry consulted for capacity, patient does not have capacity to make her own decisions -Palliative care also consulted  MRSA pneumonia -Completed antibiotics  Chronic respiratory failure -Patient did have tracheostomy however was decannulated on 01/23/2020 -Continue supportive measures -Currently on 3-4L  Diabetes mellitus, type II -Hemoglobin A1c 5.7 -Continue insulin sliding scale CBG monitoring  Essential hypertension -Continue PRN metoprolol  Anemia of chronic kidney disease -Continue Aranesp -Monitor CBC  Hyponatremia -Resolved  Anxiety/depression -Continue Paxil, BuSpar, trazodone  GERD -Continue PPI  Goals of care -Palliative care consulted -Of note palliative care discussion was had with patient was at [facility].  Previous hospitalist discussed with her daughter, and understands prognosis is guarded. -Psychiatry consulted for capacity, patient does not have capacity to make her own decisions -Ethics consulted  Nutritional support -She does not want to eat -currently has peg tube and on Tube feeds  DVT Prophylaxis  Heparin  Code Status: Full  Family Communication: None at bedside  Disposition Plan: Admitted from [place]. Now with renal failure needing HD, but  continues to refuse. Dispo TBD. LTAC has declined patient. Disposition pending patient's ability to dialyze in a chair. Pending Ethics consultation.  Consultants Nephrology PCCM Psychiatry Palliative care Interventional radiology Ethics  Procedures  Echcoardiogram 01/06/2020 shows an EF of 65 to 29%, grade 2 diastolic dysfunction.  RVSP.  Antibiotics   Anti-infectives (From admission, onward)   Start     Dose/Rate Route Frequency Ordered Stop   01/10/20 0930  vancomycin (VANCOCIN) IVPB 1000 mg/200 mL premix  Status:  Discontinued     1,000 mg 200 mL/hr over 60 Minutes Intravenous  Once 01/10/20 0915 01/13/20 0623   01/06/20 2200  ceFEPIme (MAXIPIME) 2 g in sodium chloride 0.9 % 100 mL IVPB     2 g 200 mL/hr over 30 Minutes Intravenous Every 24 hours 01/05/20 2251 01/11/20 2234   01/05/20 2200  ceFEPIme (MAXIPIME) 2 g in sodium chloride 0.9 % 100 mL IVPB     2 g 200 mL/hr over 30 Minutes Intravenous  Once 01/05/20 2148 01/06/20 0015   01/05/20 2200  metroNIDAZOLE (FLAGYL) IVPB 500 mg     500 mg 100 mL/hr over 60 Minutes Intravenous  Once 01/05/20 2148 01/06/20 0015   01/05/20 2200  vancomycin (VANCOCIN) IVPB 1000 mg/200 mL premix  Status:  Discontinued     1,000 mg 200 mL/hr over 60 Minutes Intravenous  Once 01/05/20 2148 01/05/20 2151   01/05/20 2200  vancomycin (VANCOREADY) IVPB 2000 mg/400 mL  Status:  Discontinued     2,000 mg 200 mL/hr over 120 Minutes Intravenous  Once 01/05/20 2151 01/05/20 2223      Subjective:   Bailey Cox seen and examined today.  Denies chest pain, shortness of breath, abdominal pain, nausea or vomiting, dizziness or headache.  Has no complaints this morning.  Objective:   Vitals:   02/04/20 0537 02/04/20 1359 02/05/20 0129 02/05/20 0622  BP: (!) 155/65 (!) 179/58 (!) 178/74 (!) 155/84  Pulse: 82 84 85 84  Resp:  '18 18 17  '$ Temp:  98.6 F (37 C) 98.4 F (36.9 C) (!) 97.5 F (36.4 C)  TempSrc:  Oral Oral Oral  SpO2:  96% 100% 94%    Weight:      Height:        Intake/Output Summary (Last 24 hours) at 02/05/2020 1226 Last data filed at 02/05/2020 0129 Gross per 24 hour  Intake 180 ml  Output 751 ml  Net -571 ml   Filed Weights   02/02/20 1733 02/03/20 0500 02/04/20 0500  Weight: 85.4 kg 85.3 kg 85.3 kg   Exam  General: Well developed, chronically ill-appearing, NAD  HEENT: NCAT, mucous membranes moist.   Cardiovascular: S1 S2 auscultated, RRR, no murmur  Respiratory: Clear to auscultation bilaterally with equal chest rise  Abdomen: Soft, nontender, nondistended, + bowel sounds, PEG  Extremities: warm dry without cyanosis clubbing or edema  Neuro: AAOx1 (self only), no new deficits  Data Reviewed: I have personally reviewed following labs and imaging studies  CBC: Recent Labs  Lab 02/02/20 0315 02/03/20 0355 02/04/20 0308 02/05/20 0439  WBC 4.9 5.2 5.5 6.3  HGB 7.9* 8.4* 8.6* 8.3*  HCT 26.1* 28.5* 28.9* 28.0*  MCV 96.7 97.6 97.0 95.9  PLT 141* 148* 141* 476*   Basic Metabolic Panel: Recent Labs  Lab 02/01/20 0440 02/02/20 0315 02/03/20 0355 02/04/20 0308 02/05/20 0439  NA 134*  136 139 136 136  K 4.1 4.6 4.1 4.3 4.4  CL 94* 95* 97* 96* 94*  CO2 '28 29 29 30 28  '$ GLUCOSE 131* 98 142* 146* 160*  BUN 145* 163* 69* 92* 119*  CREATININE 3.18* 3.31* 1.63* 1.95* 2.17*  CALCIUM 8.6* 8.5* 8.4* 8.7* 8.7*  MG 2.9* 3.0* 2.4 2.5* 2.7*  PHOS 6.9* 7.2* 3.9 5.0* 5.7*   GFR: Estimated Creatinine Clearance: 25.1 mL/min (A) (by C-G formula based on SCr of 2.17 mg/dL (H)). Liver Function Tests: Recent Labs  Lab 02/01/20 0440 02/02/20 0315 02/03/20 0355 02/04/20 0308 02/05/20 0439  ALBUMIN 2.5* 2.5* 2.6* 2.6* 2.5*   No results for input(s): LIPASE, AMYLASE in the last 168 hours. No results for input(s): AMMONIA in the last 168 hours. Coagulation Profile: No results for input(s): INR, PROTIME in the last 168 hours. Cardiac Enzymes: No results for input(s): CKTOTAL, CKMB, CKMBINDEX, TROPONINI  in the last 168 hours. BNP (last 3 results) No results for input(s): PROBNP in the last 8760 hours. HbA1C: No results for input(s): HGBA1C in the last 72 hours. CBG: Recent Labs  Lab 02/04/20 1945 02/04/20 2356 02/05/20 0401 02/05/20 0748 02/05/20 1139  GLUCAP 161* 150* 150* 153* 126*   Lipid Profile: No results for input(s): CHOL, HDL, LDLCALC, TRIG, CHOLHDL, LDLDIRECT in the last 72 hours. Thyroid Function Tests: No results for input(s): TSH, T4TOTAL, FREET4, T3FREE, THYROIDAB in the last 72 hours. Anemia Panel: No results for input(s): VITAMINB12, FOLATE, FERRITIN, TIBC, IRON, RETICCTPCT in the last 72 hours. Urine analysis:    Component Value Date/Time   COLORURINE AMBER (A) 01/13/2020 1302   APPEARANCEUR TURBID (A) 01/13/2020 1302   LABSPEC 1.018 01/13/2020 1302   PHURINE 5.0 01/13/2020 1302   GLUCOSEU NEGATIVE 01/13/2020 1302   HGBUR SMALL (A) 01/13/2020 1302   BILIRUBINUR NEGATIVE 01/13/2020 1302   KETONESUR NEGATIVE 01/13/2020 1302   PROTEINUR >=300 (A) 01/13/2020 1302   NITRITE NEGATIVE 01/13/2020 1302   LEUKOCYTESUR LARGE (A) 01/13/2020 1302   Sepsis Labs: '@LABRCNTIP'$ (procalcitonin:4,lacticidven:4)  )No results found for this or any previous visit (from the past 240 hour(s)).    Radiology Studies: No results found.   Scheduled Meds: . B-complex with vitamin C  1 tablet Per Tube Daily  . bisacodyl  10 mg Rectal Daily  . busPIRone  5 mg Oral BID  . chlorhexidine gluconate (MEDLINE KIT)  15 mL Mouth Rinse BID  . Chlorhexidine Gluconate Cloth  6 each Topical Q0600  . darbepoetin (ARANESP) injection - DIALYSIS  100 mcg Subcutaneous Q Wed-HD  . feeding supplement (NEPRO CARB STEADY)  1,000 mL Oral Q24H  . feeding supplement (PRO-STAT SUGAR FREE 64)  30 mL Per Tube TID  . guaiFENesin  10 mL Oral BID  . heparin  5,000 Units Subcutaneous Q8H  . insulin aspart  0-15 Units Subcutaneous Q4H  . pantoprazole sodium  40 mg Per Tube Q24H  . PARoxetine  40 mg Per  Tube Daily  . sodium chloride flush  10-40 mL Intracatheter Q12H  . traZODone  25 mg Per Tube QHS   Continuous Infusions: . sodium chloride       LOS: 31 days   Time Spent in minutes   30 minutes  Oluwafemi Villella D.O. on 02/05/2020 at 12:26 PM  Between 7am to 7pm - Please see pager noted on [email or web address]  After 7pm go to [email or web address]  And look for the night coverage person covering for me after hours  [facility] Office  [phone]

## 2020-02-05 NOTE — Progress Notes (Addendum)
Renal Navigator discussed case with Dr. Goldsborough/Nephrologist after she had evaluated patient yesterday (see progress note from 3/31) and agree that Ethics Committee may need to be involved with this difficult case. Patient has been found to lack capacity, family wants full scope of care, patient refused HD yesterday-stating that she is ready to die.  Renal Navigator spoke with Dr. Domenic Schwab of the Ethics Committee who agrees to meet with team and family to facilitate a discussion regarding plan of care for patient.  Navigator spoke with CSW/I. Chasse and PMT to discuss plans for meeting. Navigator notified Attending/Dr. Ree Kida and will update Nephrologist. Renal Navigator to continue following closely.  Alphonzo Cruise, [place]  Renal Navigator [phone]

## 2020-02-06 DIAGNOSIS — J9601 Acute respiratory failure with hypoxia: Secondary | ICD-10-CM | POA: Diagnosis not present

## 2020-02-06 DIAGNOSIS — N179 Acute kidney failure, unspecified: Secondary | ICD-10-CM | POA: Diagnosis not present

## 2020-02-06 DIAGNOSIS — R109 Unspecified abdominal pain: Secondary | ICD-10-CM | POA: Diagnosis not present

## 2020-02-06 DIAGNOSIS — F339 Major depressive disorder, recurrent, unspecified: Secondary | ICD-10-CM

## 2020-02-06 DIAGNOSIS — Z7189 Other specified counseling: Secondary | ICD-10-CM

## 2020-02-06 DIAGNOSIS — Z515 Encounter for palliative care: Secondary | ICD-10-CM | POA: Diagnosis not present

## 2020-02-06 DIAGNOSIS — N184 Chronic kidney disease, stage 4 (severe): Secondary | ICD-10-CM | POA: Diagnosis not present

## 2020-02-06 DIAGNOSIS — J9611 Chronic respiratory failure with hypoxia: Secondary | ICD-10-CM | POA: Diagnosis not present

## 2020-02-06 LAB — RENAL FUNCTION PANEL
Albumin: 2.6 g/dL — ABNORMAL LOW (ref 3.5–5.0)
Anion gap: 12 (ref 5–15)
BUN: 144 mg/dL — ABNORMAL HIGH (ref 8–23)
CO2: 29 mmol/L (ref 22–32)
Calcium: 8.9 mg/dL (ref 8.9–10.3)
Chloride: 94 mmol/L — ABNORMAL LOW (ref 98–111)
Creatinine, Ser: 2.44 mg/dL — ABNORMAL HIGH (ref 0.44–1.00)
GFR calc Af Amer: 22 mL/min — ABNORMAL LOW (ref >60)
GFR calc non Af Amer: 19 mL/min — ABNORMAL LOW (ref >60)
Glucose, Bld: 138 mg/dL — ABNORMAL HIGH (ref 70–99)
Phosphorus: 6.1 mg/dL — ABNORMAL HIGH (ref 2.5–4.6)
Potassium: 4.6 mmol/L (ref 3.5–5.1)
Sodium: 135 mmol/L (ref 135–145)

## 2020-02-06 LAB — CBC
HCT: 27.3 % — ABNORMAL LOW (ref 36.0–46.0)
HCT: 28.2 % — ABNORMAL LOW (ref 36.0–46.0)
Hemoglobin: 8.3 g/dL — ABNORMAL LOW (ref 12.0–15.0)
Hemoglobin: 8.4 g/dL — ABNORMAL LOW (ref 12.0–15.0)
MCH: 28.6 pg (ref 26.0–34.0)
MCH: 28 pg (ref 26.0–34.0)
MCHC: 30.4 g/dL (ref 30.0–36.0)
MCHC: 29.8 g/dL — ABNORMAL LOW (ref 30.0–36.0)
MCV: 94.1 fL (ref 80.0–100.0)
MCV: 94 fL (ref 80.0–100.0)
Platelets: 139 10*3/uL — ABNORMAL LOW (ref 150–400)
Platelets: 128 10*3/uL — ABNORMAL LOW (ref 150–400)
RBC: 2.9 MIL/uL — ABNORMAL LOW (ref 3.87–5.11)
RBC: 3 MIL/uL — ABNORMAL LOW (ref 3.87–5.11)
RDW: 16.2 % — ABNORMAL HIGH (ref 11.5–15.5)
RDW: 16.1 % — ABNORMAL HIGH (ref 11.5–15.5)
WBC: 5.7 10*3/uL (ref 4.0–10.5)
WBC: 5.2 10*3/uL (ref 4.0–10.5)
nRBC: 0 % (ref 0.0–0.2)
nRBC: 0 % (ref 0.0–0.2)

## 2020-02-06 LAB — GLUCOSE, CAPILLARY
Glucose-Capillary: 114 mg/dL — ABNORMAL HIGH (ref 70–99)
Glucose-Capillary: 124 mg/dL — ABNORMAL HIGH (ref 70–99)
Glucose-Capillary: 136 mg/dL — ABNORMAL HIGH (ref 70–99)
Glucose-Capillary: 142 mg/dL — ABNORMAL HIGH (ref 70–99)
Glucose-Capillary: 156 mg/dL — ABNORMAL HIGH (ref 70–99)

## 2020-02-06 LAB — MAGNESIUM: Magnesium: 2.6 mg/dL — ABNORMAL HIGH (ref 1.7–2.4)

## 2020-02-06 MED ORDER — ALTEPLASE 2 MG IJ SOLR: 2.0000 mg | Freq: Once | INTRAMUSCULAR | Status: DC | PRN

## 2020-02-06 MED ORDER — SODIUM CHLORIDE 0.9 % IV SOLN: 100.0000 mL | INTRAVENOUS | Status: DC | PRN

## 2020-02-06 MED ORDER — LIDOCAINE-PRILOCAINE 2.5-2.5 % EX CREA
1.0000 "application " | TOPICAL_CREAM | CUTANEOUS | Status: DC | PRN
  Filled 2020-02-06: qty 5

## 2020-02-06 MED ORDER — HEPARIN SODIUM (PORCINE) 1000 UNIT/ML IJ SOLN
INTRAMUSCULAR | Status: AC
  Filled 2020-02-06: qty 4

## 2020-02-06 MED ORDER — CHLORHEXIDINE GLUCONATE CLOTH 2 % EX PADS
6.0000 | MEDICATED_PAD | Freq: Every day | CUTANEOUS | Status: DC
  Administered 2020-02-07: 04:00:00 6 via TOPICAL

## 2020-02-06 MED ORDER — HEPARIN SODIUM (PORCINE) 1000 UNIT/ML DIALYSIS
1000.0000 [IU] | INTRAMUSCULAR | Status: DC | PRN
  Administered 2020-02-06: 3800 [IU] via INTRAVENOUS_CENTRAL
  Filled 2020-02-06 (×3): qty 1

## 2020-02-06 MED ORDER — LIDOCAINE HCL (PF) 1 % IJ SOLN: 5.0000 mL | INTRAMUSCULAR | Status: DC | PRN

## 2020-02-06 MED ORDER — PENTAFLUOROPROP-TETRAFLUOROETH EX AERO: 1.0000 "application " | INHALATION_SPRAY | CUTANEOUS | Status: DC | PRN

## 2020-02-06 NOTE — TOC Progression Note (Signed)
Transition of Care [facility]) - Progression Note    Patient Details  Name: Bailey Cox MRN: [id number] Date of Birth: 12/13/1945  Transition of Care [facility]) CM/SW [place], [place] Phone Number: 02/06/2020, 11:50 AM  Clinical Narrative:    CSW touched base w/ Ihor Dow w/ PMT. TOC team available for support, pt family (daughter Katharine Look) was planning on coming today to further discuss [place]. She lives about 3 hours away and was tentatively going to be here around 11ish today.    Expected Discharge Plan: Skilled Nursing Facility Barriers to Discharge: Continued Medical Work up, Other (comment)(ability to tolerate dialysis in recliner)  Expected Discharge Plan and Services Expected Discharge Plan: [place] In-house Referral: Clinical Social Work Discharge Planning Services: CM Consult Post Acute Care Choice: [place] Living arrangements for the past 2 months: [place]  Readmission Risk Interventions Readmission Risk Prevention Plan 01/28/2020  Transportation Screening Complete  PCP or Specialist Appt within 3-5 Days Not Complete  Not Complete comments plan for SNF  HRI or [place] Not Complete  HRI or Home Care Consult comments plan for SNF  Social Work Consult for [place] Planning/Counseling Complete  Palliative Care Screening Complete  Medication Review Press photographer) Referral to Pharmacy  PCP or Specialist appointment within 3-5 days of discharge Not Complete  PCP/Specialist Appt Not Complete comments plan for SNF  HRI or Home Care Consult Complete  SW Recovery Care/Counseling Consult Complete  Palliative Care Screening Not Complete  Comments appropriate at this time  [place] Complete

## 2020-02-06 NOTE — Progress Notes (Signed)
Patient's daughter Katharine Look at the bedside and states patient is willing to do dialysis if the daughter stays in town tonight. Patient verbalizes yes. Dr. Justin Mend made aware.

## 2020-02-06 NOTE — Progress Notes (Signed)
Daily Progress Note   Patient Name: Bailey Cox       Date: 02/06/2020 DOB: 1946/06/21  Age: 74 y.o. MRN#: [id number] Attending Physician: Cristal Ford, DO Primary Care Physician: Patient, No Pcp Per Admit Date: 01/05/2020  Reason for Consultation/Follow-up: Establishing goals of care  Subjective: Patient wakes to voice. Flat affect. Drowsy. Minimally engaged in discussion. Frequent prompting from this NP and daughter at bedside with delayed responses. She is oriented to person and 'hospital.' Reoriented to time/situation. Denies current pain or discomfort.   GOC:  F/u GOC with daughter, Katharine Look at bedside. Patient/family live in [place]. Katharine Look works M-F and only available to visit the hospital on weekends.   Discussed events leading up to admission and course of hospitalization including diagnoses, interventions, plan of care. Katharine Look shares that her mother is not ready to die and says she will do dialysis today as long as Sandra stays the night. Patient does confirm she will do dialysis today and understands that without dialysis, she will die. Explored why patient was refusing dialysis. She says pain and nausea. Discussed pre-medicating before dialysis to set her up for a better session. Explored why patient has been refusing PT/OT, oral care, and even assessments by attending. Patient does not give a reason, just shrugs her shoulders.   Reiterated multiple times while at bedside to patient and daughter that if Kasee wishes to live, she needs to be compliant with dialysis and participate in therapy. Reiterated that she needs to tolerate sitting up in the recliner for at least 4 hours in order to be considered for dialysis clinic and be able to discharge from the hospital. Explored  how we can help motivate Daylyn to do these things, especially when her family is not present. Discussed intermittent iPAD video chats with family. Also reviewed SLP evaluation and encouraged small bites and sips that have been approved by SLP.   Also emphasized that if the patient does not wish for aggressive medical management, we would focus on her comfort and symptom management, understanding she would near the end of her life. Katharine Look is very frank with her mother, explaining that she respects either decision. Also explaining that if her mother does not want dialysis, we can get her into a hospice facility in [place] close to family. Patient says "no" to hospice. She will  have dialysis today. She does not wish to work with PT today but tells [place] she will this weekend.   Discussed code status with patient and daughter at bedside, trying to explain these serious decisions as simple as possible. Patient states "I want to be helped." She does continue to wish for FULL code in the attempt of cardiac arrest and even agrees with another trach if it came to this. Encouraged ongoing discussions with her daughter regarding her wishes and what quality of life looks moving forward, especially pending her ability to progress following acute illness and if she tolerates dialysis.   Hard Choices booklet and MOST form given to [place]. PMT contact information given. Reassured of ongoing support from palliative.    Length of Stay: 32  Current Medications: Scheduled Meds:  . B-complex with vitamin C  1 tablet Per Tube Daily  . bisacodyl  10 mg Rectal Daily  . busPIRone  5 mg Per Tube BID  . chlorhexidine gluconate (MEDLINE KIT)  15 mL Mouth Rinse BID  . Chlorhexidine Gluconate Cloth  6 each Topical Q0600  . darbepoetin (ARANESP) injection - DIALYSIS  100 mcg Subcutaneous Q Wed-HD  . feeding supplement (NEPRO CARB STEADY)  1,000 mL Oral Q24H  . feeding supplement (PRO-STAT SUGAR FREE 64)  30 mL Per Tube  TID  . guaiFENesin  10 mL Per Tube BID  . heparin  5,000 Units Subcutaneous Q8H  . insulin aspart  0-15 Units Subcutaneous Q4H  . pantoprazole sodium  40 mg Per Tube Q24H  . PARoxetine  40 mg Per Tube Daily  . sodium chloride flush  10-40 mL Intracatheter Q12H  . traZODone  25 mg Per Tube QHS    Continuous Infusions: . sodium chloride      PRN Meds: fentaNYL (SUBLIMAZE) injection, metoprolol tartrate, ondansetron (ZOFRAN) IV, sodium chloride, sodium chloride flush  Physical Exam Vitals and nursing note reviewed.  Constitutional:      Appearance: She is ill-appearing.  HENT:     Head: Normocephalic and atraumatic.  Pulmonary:     Effort: No tachypnea, accessory muscle usage or respiratory distress.  Skin:    General: Skin is warm and dry.     Coloration: Skin is pale.  Neurological:     Mental Status: She is easily aroused.     Comments: Oriented to person and 'hospital.' Reoriented to time/situation. Delayed responses            Vital Signs: BP (!) 175/86 (BP Location: Left Leg)   Pulse 82   Temp 97.9 F (36.6 C) (Oral)   Resp 18   Ht '5\' 4"'$  (1.626 m)   Wt 85.3 kg   SpO2 99%   BMI 32.28 kg/m  SpO2: SpO2: 99 % O2 Device: O2 Device: Room Air O2 Flow Rate: O2 Flow Rate (L/min): 4 L/min  Intake/output summary:   Intake/Output Summary (Last 24 hours) at 02/06/2020 1020 Last data filed at 02/06/2020 6578 Gross per 24 hour  Intake 2080 ml  Output 1200 ml  Net 880 ml   LBM: Last BM Date: 02/05/20 Baseline Weight: Weight: 113 kg Most recent weight: Weight: 85.3 kg       Palliative Assessment/Data: PPS 40%    Flowsheet Rows     Most Recent Value  Intake Tab  Referral Department  Hospitalist  Unit at Time of Referral  Med/Surg Unit  Palliative Care Primary Diagnosis  Nephrology  Date Notified  01/29/20  Palliative Care Type  New Palliative care  Reason  for referral  Clarify Goals of Care  Date of Admission  01/05/20  Date first seen by Palliative Care   02/02/20  # of days Palliative referral response time  4 Day(s)  # of days IP prior to Palliative referral  24  Clinical Assessment  Psychosocial & Spiritual Assessment  Palliative Care Outcomes      Patient Active Problem List   Diagnosis Date Noted  . Abdominal pain   . Palliative care encounter   . Acute hypoxemic respiratory failure ([place])   . Acute kidney injury ([place])   . Anemia due to stage 4 chronic kidney disease ([place])   . Hypotension   . Sepsis ([place])   . Renal failure 01/06/2020  . Tracheostomy status ([place]) 01/06/2020  . Chronic hypoxemic respiratory failure ([place]) 01/06/2020  . Shock ([place]) 01/05/2020    Palliative Care Assessment & Plan   Patient Profile: 74 y.o. female  with past medical history of cholecystectomy with CBD stenting in September followed by PEA arrest during endoscopy that lead to hospitalization from December 2020 until January 05, 2020 at [facility]). 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 She was discharged to [facility] on 3/1 who immediately sent her to [facility].  On admission 01/05/2020 she was found to have acidosis with acute on chronic kidney failure.  Unfortunately she has not had renal recovery.  Assessment: AKI on CKD stage IV MRSA pneumonia Chronic respiratory, now decannulated DM type II Essential hypertension Anemia of CKD Anxiety Depression  Recommendations/Plan:  Continue full code/full scope treatment following f/u [place] with daughter and patient. Patient is not ready to die and not interested in hospice services. Ongoing discussions pending her participation with therapy and compliance with hemodialysis. Will consider ethics discussion with daughter next week if patient continues to refuse care when daughter leaves town.   PT/OT/SLP  Recommend attempting iPAD video chats with family when they are unavailable to visit [facility] during the week.    Manage pain/nausea during dialysis. PRN's available.   PMT will follow inpatient.   Goals of Care and Additional Recommendations:  Limitations on Scope of Treatment: Full Scope Treatment  Code Status: FULL   Code Status Orders  (From admission, onward)         Start     Ordered   01/05/20 2350  Full code  Continuous     01/05/20 2352        Code Status History    This patient has a current code status but no historical code status.   Advance Care Planning Activity       Prognosis:  Poor prognosis  Discharge Planning:   To Be Determined  Care plan was discussed with Dr. Ree Kida, RN, patient, daughter  Thank you for allowing the Palliative Medicine Team to assist in the care of this patient.   Time In: 1215 Time Out: 1330 Total Time 75 Prolonged Time Billed yes      Greater than 50%  of this time was spent counseling and coordinating care related to the above assessment and plan.  Ihor Dow, DNP, FNP-C Palliative Medicine Team  Phone: [phone] Fax: [phone]  Please contact Palliative Medicine Team phone at [phone] for questions and concerns.

## 2020-02-06 NOTE — Progress Notes (Signed)
PROGRESS NOTE    Bailey Cox  POI:[id number] DOB: 1946-03-16 DOA: 01/05/2020 PCP: Patient, No Pcp Per   Brief Narrative:  HPI on 01/05/2020 by Mr. Georgann Housekeeper, NP (ICU) 74 year old female with PMH as below, which is significant for long admission in the [facility] system from 10/2019 all the way through 01/05/2020.  She was originally hospitalized in September 2020 with acute cholecystitis and underwent a cholecystectomy.  She was then sent to SNF for short duration but had return to [facility] to have a bile duct stent placed ultimately returning to SNF.  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  While hospitalized she developed fluid overload and was ultimately transferred to [facility] for dialysis.  Her course after that point become somewhat unclear, but as described by her family she was on and off the ventilator for several procedures and was eventually left intubated.  One of these procedures was an endoscopy during which she unfortunately suffered a cardiac arrest.  Details of the arrest are unclear.  She remained on the ventilator and ultimately underwent tracheostomy.  Her hospital course was also complicated by MRSA pneumonia.  She was ultimately able to come off of hemodialysis and was treated intermittently with diuresis.  It sounds like she bounced in and out of the ICU a few times with volume overload.  On the tail end of her admission she still struggled with kidney disease and was being treated for a urinary tract infection with cefepime and vancomycin.  Plans were to stop on 3/2.  She was discharged from the [facility] system on 3/1 to [facility] in [place].  Upon arrival to [facility] they deemed her too sick for admission and transferred her to [facility] emergency department with complaints of volume overload and acidosis.   Interim history Initially admitted by ICU this patient was ventilator dependent, into trach, which  is now been removed.  Patient started CRRT on 01/13/2020 until 01/16/2020 and transferred to the floor on 01/18/2020 as she was tolerating hemodialysis.  Nephrology has been seeing her and feels that she has AKI on CKD most likely being from ATN/sepsis.  Patient family wants aggressive measures.  CRRT was discontinued on 01/15/2020.  Patient now has [facility] and is scheduled for intermittent dialysis.  She refused to go to dialysis today. 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  However patient continues to refuse dialysis -Psychiatry consulted for capacity, patient does not have capacity to make her own decisions -Palliative care also consulted and pending family meeting  MRSA pneumonia -Completed antibiotics  Chronic respiratory failure -Patient did have tracheostomy however was decannulated on 01/23/2020 -Continue supportive measures -Currently on 3-4L  Diabetes mellitus, type II -Hemoglobin A1c 5.7 -Continue insulin sliding scale CBG monitoring  Essential hypertension -Continue PRN metoprolol  Anemia of chronic kidney disease -Continue Aranesp -Monitor CBC  Hyponatremia -Resolved  Anxiety/depression -Continue Paxil, BuSpar, trazodone  GERD -Continue PPI  Goals of care -Palliative care consulted -Of note palliative care discussion was had with patient was at [facility].  Previous hospitalist discussed with her daughter, and understands prognosis is guarded. -Psychiatry consulted for capacity, patient does not have capacity to make her own decisions -Ethics consulted  Nutritional support -She does not want to eat -currently has peg tube and on Tube feeds  DVT Prophylaxis  Heparin  Code Status: Full  Family Communication: None at bedside  Disposition Plan: Admitted from [place]. Now with renal  failure needing HD, but continues to refuse. Dispo TBD. LTAC has declined patient. Disposition pending patient's ability to dialyze in a chair. Pending Palliative care and Ethics consultation.  Consultants Nephrology PCCM Psychiatry Palliative care Interventional radiology Ethics  Procedures  Echcoardiogram 01/06/2020 shows an EF of 65 to 91%, grade 2 diastolic dysfunction.  RVSP.  Antibiotics   Anti-infectives (From admission, onward)   Start     Dose/Rate Route Frequency Ordered Stop   01/10/20 0930  vancomycin (VANCOCIN) IVPB 1000 mg/200 mL premix  Status:  Discontinued     1,000 mg 200 mL/hr over 60 Minutes Intravenous  Once 01/10/20 0915 01/13/20 0623   01/06/20 2200  ceFEPIme (MAXIPIME) 2 g in sodium chloride 0.9 % 100 mL IVPB     2 g 200 mL/hr over 30 Minutes Intravenous Every 24 hours 01/05/20 2251 01/11/20 2234   01/05/20 2200  ceFEPIme (MAXIPIME) 2 g in sodium chloride 0.9 % 100 mL IVPB     2 g 200 mL/hr over 30 Minutes Intravenous  Once 01/05/20 2148 01/06/20 0015   01/05/20 2200  metroNIDAZOLE (FLAGYL) IVPB 500 mg     500 mg 100 mL/hr over 60 Minutes Intravenous  Once 01/05/20 2148 01/06/20 0015   01/05/20 2200  vancomycin (VANCOCIN) IVPB 1000 mg/200 mL premix  Status:  Discontinued     1,000 mg 200 mL/hr over 60 Minutes Intravenous  Once 01/05/20 2148 01/05/20 2151   01/05/20 2200  vancomycin (VANCOREADY) IVPB 2000 mg/400 mL  Status:  Discontinued     2,000 mg 200 mL/hr over 120 Minutes Intravenous  Once 01/05/20 2151 01/05/20 2223      Subjective:   Bailey Cox seen and examined today.  Not very interactive today. Denies chest pain or shortness of breath, abdominal pain. Wants to be left alone.    Objective:   Vitals:   02/05/20 0622 02/05/20 1300 02/05/20 1954 02/06/20 0353  BP: (!) 155/84 (!) 156/66 138/88 (!) 175/86  Pulse: 84 80 80 82  Resp: '17 17 18 18  '$ Temp: (!) 97.5 F (36.4 C) 97.7 F (36.5 C) 98.2 F (36.8 C) 97.9 F (36.6 C)  TempSrc:  Oral Oral Oral Oral  SpO2: 94% 99% 96% 99%  Weight:      Height:        Intake/Output Summary (Last 24 hours) at 02/06/2020 1158 Last data filed at 02/06/2020 6384 Gross per 24 hour  Intake 2080 ml  Output 1200 ml  Net 880 ml   Filed Weights   02/02/20 1733 02/03/20 0500 02/04/20 0500  Weight: 85.4 kg 85.3 kg 85.3 kg   Exam  General: Well developed, chronically ill-appearing, NAD  HEENT: NCAT, mucous membranes moist.   Cardiovascular: S1 S2 auscultated, RRR, no murmur  Respiratory: Clear to auscultation bilaterally  Abdomen: Soft, nontender, nondistended, + bowel sounds, PEG  Extremities: warm dry without cyanosis clubbing or edema  Neuro: AAOx1 (self only), no new deficits  Data Reviewed: I have personally reviewed following labs and imaging studies  CBC: Recent Labs  Lab 02/02/20 0315 02/03/20 0355 02/04/20 0308 02/05/20 0439 02/06/20 0431  WBC 4.9 5.2 5.5 6.3 5.7  HGB 7.9* 8.4* 8.6* 8.3* 8.3*  HCT 26.1* 28.5* 28.9* 28.0* 27.3*  MCV 96.7 97.6 97.0 95.9 94.1  PLT 141* 148* 141* 143* 665*   Basic Metabolic Panel: Recent Labs  Lab 02/02/20 0315 02/03/20  5625 02/04/20 0308 02/05/20 0439 02/06/20 0431  NA 136 139 136 136 135  K 4.6 4.1 4.3 4.4 4.6  CL 95* 97* 96* 94* 94*  CO2 '29 29 30 28 29  '$ GLUCOSE 98 142* 146* 160* 138*  BUN 163* 69* 92* 119* 144*  CREATININE 3.31* 1.63* 1.95* 2.17* 2.44*  CALCIUM 8.5* 8.4* 8.7* 8.7* 8.9  MG 3.0* 2.4 2.5* 2.7* 2.6*  PHOS 7.2* 3.9 5.0* 5.7* 6.1*   GFR: Estimated Creatinine Clearance: 22.3 mL/min (A) (by C-G formula based on SCr of 2.44 mg/dL (H)). Liver Function Tests: Recent Labs  Lab 02/02/20 0315 02/03/20 0355 02/04/20 0308 02/05/20 0439 02/06/20 0431  ALBUMIN 2.5* 2.6* 2.6* 2.5* 2.6*   No results for input(s): LIPASE, AMYLASE in the last 168 hours. No results for input(s): AMMONIA in the last 168 hours. Coagulation Profile: No results for input(s): INR, PROTIME in the last 168 hours. Cardiac Enzymes:  No results for input(s): CKTOTAL, CKMB, CKMBINDEX, TROPONINI in the last 168 hours. BNP (last 3 results) No results for input(s): PROBNP in the last 8760 hours. HbA1C: No results for input(s): HGBA1C in the last 72 hours. CBG: Recent Labs  Lab 02/05/20 1951 02/05/20 2357 02/06/20 0349 02/06/20 0728 02/06/20 1150  GLUCAP 127* 139* 124* 136* 142*   Lipid Profile: No results for input(s): CHOL, HDL, LDLCALC, TRIG, CHOLHDL, LDLDIRECT in the last 72 hours. Thyroid Function Tests: No results for input(s): TSH, T4TOTAL, FREET4, T3FREE, THYROIDAB in the last 72 hours. Anemia Panel: No results for input(s): VITAMINB12, FOLATE, FERRITIN, TIBC, IRON, RETICCTPCT in the last 72 hours. Urine analysis:    Component Value Date/Time   COLORURINE AMBER (A) 01/13/2020 1302   APPEARANCEUR TURBID (A) 01/13/2020 1302   LABSPEC 1.018 01/13/2020 1302   PHURINE 5.0 01/13/2020 1302   GLUCOSEU NEGATIVE 01/13/2020 1302   HGBUR SMALL (A) 01/13/2020 1302   BILIRUBINUR NEGATIVE 01/13/2020 1302   KETONESUR NEGATIVE 01/13/2020 1302   PROTEINUR >=300 (A) 01/13/2020 1302   NITRITE NEGATIVE 01/13/2020 1302   LEUKOCYTESUR LARGE (A) 01/13/2020 1302   Sepsis Labs: '@LABRCNTIP'$ (procalcitonin:4,lacticidven:4)  )No results found for this or any previous visit (from the past 240 hour(s)).    Radiology Studies: No results found.   Scheduled Meds: . B-complex with vitamin C  1 tablet Per Tube Daily  . bisacodyl  10 mg Rectal Daily  . busPIRone  5 mg Per Tube BID  . chlorhexidine gluconate (MEDLINE KIT)  15 mL Mouth Rinse BID  . Chlorhexidine Gluconate Cloth  6 each Topical Q0600  . darbepoetin (ARANESP) injection - DIALYSIS  100 mcg Subcutaneous Q Wed-HD  . feeding supplement (NEPRO CARB STEADY)  1,000 mL Oral Q24H  . feeding supplement (PRO-STAT SUGAR FREE 64)  30 mL Per Tube TID  . guaiFENesin  10 mL Per Tube BID  . heparin  5,000 Units Subcutaneous Q8H  . insulin aspart  0-15 Units Subcutaneous Q4H  .  pantoprazole sodium  40 mg Per Tube Q24H  . PARoxetine  40 mg Per Tube Daily  . sodium chloride flush  10-40 mL Intracatheter Q12H  . traZODone  25 mg Per Tube QHS   Continuous Infusions: . sodium chloride       LOS: 32 days   Time Spent in minutes   30 minutes  Jaston Havens D.O. on 02/06/2020 at 11:58 AM  Between 7am to 7pm - Please see pager noted on [email or web address]  After 7pm go to [email or web address]  And look for the night coverage person covering for  me after hours  [facility] Office  [phone]

## 2020-02-06 NOTE — Progress Notes (Signed)
[facility] ROUNDING NOTE   Subjective:   This is a 74 year old female admitted 01/05/2020 from [facility] after having arrived from a long admission at [facility].  12/20-01/05/2020.  Where she had a PEA arrest following an endoscopy and ended up with a tracheostomy.  Her initial hospitalization in September 2021 was for acute cholecystectomy and cholecystitis.  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  Her hospital course has been complicated by MRSA pneumonia.  She was transferred from [facility] for volume overload and acidosis and underwent CRRT 01/13/2020 until 01/16/2020 where she has been tolerating hemodialysis.  She continues on a Tuesday Thursday Saturday schedule.  Plan is for placement back at [facility].  She has been refusing dialysis it appears her last dialysis treatment was 02/02/2020.  However this morning she is agreeable.  02/06/2020.  Blood pressure 175/86 pulse 82 temperature 97.9 O2 sats 9 9% room air  Sodium 135 potassium 4.6 chloride 94 CO2 29 BUN 144 creatinine 2.44 glucose 138 calcium 8.9 phosphorus 6.1 magnesium 2.6 albumin 2.6 WBC 5.7 hemoglobin 8.3 platelets 139  BuSpar 5 mg twice a day, darbepoetin 100 mcg every Wednesday, insulin sliding scale, Protonix 40 mg every 24 hours, Paxil 40 mg daily, trazodone daily.  Objective:  Vital signs in last 24 hours:  Temp:  [97.7 F (36.5 C)-98.2 F (36.8 C)] 97.9 F (36.6 C) (04/02 0353) Pulse Rate:  [80-82] 82 (04/02 0353) Resp:  [17-18] 18 (04/02 0353) BP: (138-175)/(66-88) 175/86 (04/02 0353) SpO2:  [96 %-99 %] 99 % (04/02 0353)  Weight change:  Filed Weights   02/02/20 1733 02/03/20 0500 02/04/20 0500  Weight: 85.4 kg 85.3 kg 85.3 kg    Intake/Output: I/O last 3 completed shifts: In: 2080 [P.O.:120; NG/GT:1960] Out: 4174 [Urine:1601]   Intake/Output this shift:  No intake/output data recorded.  Gen: Appears comfortable resting in bed CVS: Pulse regular rhythm, normal rate, S1 and S2 normal.  Tracheostomy site intact dressing. Resp: Clear to auscultation bilaterally, right IJ TDC. Abd: Soft, obese, nontender Ext: Trace-1+lower extremity edema   Basic Metabolic Panel: Recent Labs  Lab 02/02/20 0315 02/02/20 0315 02/03/20 0355 02/03/20 0355 02/04/20 0308 02/05/20 0439 02/06/20 0431  NA 136  --  139  --  136 136 135  K 4.6  --  4.1  --  4.3 4.4 4.6  CL 95*  --  97*  --  96* 94* 94*  CO2 29  --  29  --  '30 28 29  '$ GLUCOSE 98  --  142*  --  146* 160* 138*  BUN 163*  --  69*  --  92* 119* 144*  CREATININE 3.31*  --  1.63*  --  1.95* 2.17* 2.44*  CALCIUM 8.5*   < > 8.4*   < > 8.7* 8.7* 8.9  MG 3.0*  --  2.4  --  2.5* 2.7* 2.6*  PHOS 7.2*  --  3.9  --  5.0* 5.7* 6.1*   < > = values in this interval not displayed.    Liver Function Tests: Recent Labs  Lab 02/02/20 0315 02/03/20 0355 02/04/20 0308 02/05/20 0439 02/06/20 0431  ALBUMIN 2.5* 2.6* 2.6* 2.5* 2.6*   No results for input(s): LIPASE, AMYLASE in the last 168 hours. No results for input(s): AMMONIA in the last 168 hours.  CBC: Recent Labs  Lab 02/02/20 0315 02/03/20 0355 02/04/20 0308 02/05/20 0439 02/06/20 0431  WBC 4.9 5.2 5.5 6.3 5.7  HGB 7.9* 8.4* 8.6* 8.3* 8.3*  HCT 26.1*  28.5* 28.9* 28.0* 27.3*  MCV 96.7 97.6 97.0 95.9 94.1  PLT 141* 148* 141* 143* 139*    Cardiac Enzymes: No results for input(s): CKTOTAL, CKMB, CKMBINDEX, TROPONINI in the last 168 hours.  BNP: Invalid input(s): POCBNP  CBG: Recent Labs  Lab 02/05/20 1951 02/05/20 2357 02/06/20 0349 02/06/20 0728 02/06/20 1150  GLUCAP 127* 139* 124* 136* 142*    Microbiology: Results for orders placed or performed during the hospital encounter of 01/05/20  Blood culture (routine x 2)     Status: None   Collection Time: 01/05/20  8:04 PM   Specimen: BLOOD  Result Value Ref Range Status   Specimen Description BLOOD PICC LINE  Final   Special Requests   Final    BOTTLES DRAWN AEROBIC AND ANAEROBIC Blood Culture adequate  volume   Culture   Final    NO GROWTH 5 DAYS Performed at [facility], [place] [address], [place], [place] [address]    Report Status 01/10/2020 FINAL  Final  Blood culture (routine x 2)     Status: None   Collection Time: 01/05/20  8:04 PM   Specimen: BLOOD RIGHT HAND  Result Value Ref Range Status   Specimen Description BLOOD RIGHT HAND  Final   Special Requests   Final    BOTTLES DRAWN AEROBIC AND ANAEROBIC Blood Culture adequate volume   Culture   Final    NO GROWTH 5 DAYS Performed at [facility], [place] [address], [place], [place] [address]    Report Status 01/10/2020 FINAL  Final  Urine culture     Status: None   Collection Time: 01/05/20  8:04 PM   Specimen: Urine, Random  Result Value Ref Range Status   Specimen Description URINE, RANDOM  Final   Special Requests NONE  Final   Culture   Final    NO GROWTH Performed at [facility], [place] [address], [place], [place] [address]    Report Status 01/07/2020 FINAL  Final  Respiratory Panel by RT PCR (Flu A&B, Covid) - Nasopharyngeal Swab     Status: None   Collection Time: 01/05/20  8:04 PM   Specimen: Nasopharyngeal Swab  Result Value Ref Range Status   SARS Coronavirus 2 by RT PCR NEGATIVE NEGATIVE Final    Comment: (NOTE) SARS-CoV-2 target nucleic acids are NOT DETECTED. The SARS-CoV-2 RNA is generally detectable in upper respiratoy specimens during the acute phase of infection. The lowest concentration of SARS-CoV-2 viral copies this assay can detect is 131 copies/mL. A negative result does not preclude SARS-Cov-2 infection and should not be used as the sole basis for treatment or other patient management decisions. A negative result may occur with  improper specimen collection/handling, submission of specimen other than nasopharyngeal swab, presence of viral mutation(s) within the areas targeted by this assay, and inadequate number of viral copies (<131 copies/mL). A negative result must be combined  with clinical observations, patient history, and epidemiological information. The expected result is Negative. Fact Sheet for Patients:  PinkCheek.be Fact Sheet for Healthcare Providers:  GravelBags.it This test is not yet ap proved or cleared by the [place] FDA and  has been authorized for detection and/or diagnosis of SARS-CoV-2 by FDA under an Emergency Use Authorization (EUA). This EUA will remain  in effect (meaning this test can be used) for the duration of the COVID-19 declaration under Section 564(b)(1) of the Act, 21 U.S.C. section 360bbb-3(b)(1), unless the authorization is terminated or revoked sooner.  Influenza A by PCR NEGATIVE NEGATIVE Final   Influenza B by PCR NEGATIVE NEGATIVE Final    Comment: (NOTE) The Xpert Xpress SARS-CoV-2/FLU/RSV assay is intended as an aid in  the diagnosis of influenza from Nasopharyngeal swab specimens and  should not be used as a sole basis for treatment. Nasal washings and  aspirates are unacceptable for Xpert Xpress SARS-CoV-2/FLU/RSV  testing. Fact Sheet for Patients: PinkCheek.be Fact Sheet for Healthcare Providers: GravelBags.it This test is not yet approved or cleared by the [place] FDA and  has been authorized for detection and/or diagnosis of SARS-CoV-2 by  FDA under an Emergency Use Authorization (EUA). This EUA will remain  in effect (meaning this test can be used) for the duration of the  Covid-19 declaration under Section 564(b)(1) of the Act, 21  U.S.C. section 360bbb-3(b)(1), unless the authorization is  terminated or revoked. Performed at [facility], [place] [address] [place], [place] [address]   MRSA PCR Screening     Status: Abnormal   Collection Time: 01/06/20  2:20 AM   Specimen: Nasopharyngeal  Result Value Ref Range Status   MRSA by PCR POSITIVE (A) NEGATIVE Final    Comment:         The GeneXpert MRSA Assay (FDA approved for NASAL specimens only), is one component of a comprehensive MRSA colonization surveillance program. It is not intended to diagnose MRSA infection nor to guide or monitor treatment for MRSA infections. RESULT CALLED TO, READ BACK BY AND VERIFIED WITH: STAPLETON,R RN [phone] MITCHELL,L   Culture, respiratory (non-expectorated)     Status: None   Collection Time: 01/08/20  9:36 AM   Specimen: Tracheal Aspirate; Respiratory  Result Value Ref Range Status   Specimen Description TRACHEAL ASPIRATE  Final   Special Requests NONE  Final   Gram Stain   Final    RARE WBC PRESENT, PREDOMINANTLY PMN RARE GRAM NEGATIVE RODS RARE GRAM POSITIVE COCCI IN PAIRS Performed at [facility], [place] [address] [place], [place] [address]    Culture RARE PSEUDOMONAS AERUGINOSA  Final   Report Status 01/10/2020 FINAL  Final   Organism ID, Bacteria PSEUDOMONAS AERUGINOSA  Final      Susceptibility   Pseudomonas aeruginosa - MIC*    CEFTAZIDIME 2 SENSITIVE Sensitive     CIPROFLOXACIN <=0.25 SENSITIVE Sensitive     GENTAMICIN <=1 SENSITIVE Sensitive     IMIPENEM 2 SENSITIVE Sensitive     PIP/TAZO <=4 SENSITIVE Sensitive     CEFEPIME 2 SENSITIVE Sensitive     * RARE PSEUDOMONAS AERUGINOSA    Coagulation Studies: No results for input(s): LABPROT, INR in the last 72 hours.  Urinalysis: No results for input(s): COLORURINE, LABSPEC, PHURINE, GLUCOSEU, HGBUR, BILIRUBINUR, KETONESUR, PROTEINUR, UROBILINOGEN, NITRITE, LEUKOCYTESUR in the last 72 hours.  Invalid input(s): APPERANCEUR    Imaging: No results found.   Medications:   . sodium chloride     . B-complex with vitamin C  1 tablet Per Tube Daily  . bisacodyl  10 mg Rectal Daily  . busPIRone  5 mg Per Tube BID  . chlorhexidine gluconate (MEDLINE KIT)  15 mL Mouth Rinse BID  . Chlorhexidine Gluconate Cloth  6 each Topical Q0600  . darbepoetin (ARANESP) injection - DIALYSIS  100 mcg  Subcutaneous Q Wed-HD  . feeding supplement (NEPRO CARB STEADY)  1,000 mL Oral Q24H  . feeding supplement (PRO-STAT SUGAR FREE 64)  30 mL Per Tube TID  . guaiFENesin  10 mL Per Tube BID  .  heparin  5,000 Units Subcutaneous Q8H  . insulin aspart  0-15 Units Subcutaneous Q4H  . pantoprazole sodium  40 mg Per Tube Q24H  . PARoxetine  40 mg Per Tube Daily  . sodium chloride flush  10-40 mL Intracatheter Q12H  . traZODone  25 mg Per Tube QHS   fentaNYL (SUBLIMAZE) injection, metoprolol tartrate, ondansetron (ZOFRAN) IV, sodium chloride, sodium chloride flush  Assessment/ Plan:  1. Acute kidney Injury on chronic kidney disease stage [id number] acute kidney injury is from sepsis/ATN. Previously on CRRT beginning 3/7 and later transitioned to intermittent hemodialysis after hemodynamic stability established. She is nonoliguric and with worsening azotemia on labs seen along with slow rise of creatinine. She has refused hemodialysis for the last 3 days her daughter is coming in today to help facilitate discussion/goals of care.   Appreciate assistance from Dr. Darleene Cleaver psychiatry.  Patient is uncooperative with capacity assessment or psychiatric evaluation.  She apparently is agreeable to dialysis 02/06/2020.  We will schedule her dialysis treatment.  I will run her for just 2-1/2 hours with very low blood flows due to the high level of BUN and to mitigate against disequilibrium 2. Sepsis: Suspected to be of urinary source but appears to have now resolved status post antibiotic treatment. 3. History of PEA arrest status post tracheostomy; decannulated on 3/19 and appears to be having decent oxygenation since then. 4. Anemia: Secondary to chronic illness exacerbated by recent hospitalization-adequate iron stores and now on ESA. 5. Disposition: Difficult situation,  refuses dialysis regularly and insists that "she does not want to die".     LOS: [place] '@TODAY''@12'$ :40 PM

## 2020-02-07 DIAGNOSIS — N184 Chronic kidney disease, stage 4 (severe): Secondary | ICD-10-CM | POA: Diagnosis not present

## 2020-02-07 DIAGNOSIS — R531 Weakness: Secondary | ICD-10-CM

## 2020-02-07 DIAGNOSIS — Z515 Encounter for palliative care: Secondary | ICD-10-CM

## 2020-02-07 DIAGNOSIS — N179 Acute kidney failure, unspecified: Secondary | ICD-10-CM | POA: Diagnosis not present

## 2020-02-07 DIAGNOSIS — J9611 Chronic respiratory failure with hypoxia: Secondary | ICD-10-CM | POA: Diagnosis not present

## 2020-02-07 DIAGNOSIS — J9601 Acute respiratory failure with hypoxia: Secondary | ICD-10-CM | POA: Diagnosis not present

## 2020-02-07 DIAGNOSIS — R109 Unspecified abdominal pain: Secondary | ICD-10-CM | POA: Diagnosis not present

## 2020-02-07 DIAGNOSIS — Z7189 Other specified counseling: Secondary | ICD-10-CM | POA: Diagnosis not present

## 2020-02-07 LAB — RENAL FUNCTION PANEL
Albumin: 2.6 g/dL — ABNORMAL LOW (ref 3.5–5.0)
Anion gap: 10 (ref 5–15)
BUN: 97 mg/dL — ABNORMAL HIGH (ref 8–23)
CO2: 30 mmol/L (ref 22–32)
Calcium: 8.8 mg/dL — ABNORMAL LOW (ref 8.9–10.3)
Chloride: 97 mmol/L — ABNORMAL LOW (ref 98–111)
Creatinine, Ser: 1.91 mg/dL — ABNORMAL HIGH (ref 0.44–1.00)
GFR calc Af Amer: 30 mL/min — ABNORMAL LOW (ref >60)
GFR calc non Af Amer: 26 mL/min — ABNORMAL LOW (ref >60)
Glucose, Bld: 143 mg/dL — ABNORMAL HIGH (ref 70–99)
Phosphorus: 4.4 mg/dL (ref 2.5–4.6)
Potassium: 3.9 mmol/L (ref 3.5–5.1)
Sodium: 137 mmol/L (ref 135–145)

## 2020-02-07 LAB — GLUCOSE, CAPILLARY
Glucose-Capillary: 129 mg/dL — ABNORMAL HIGH (ref 70–99)
Glucose-Capillary: 147 mg/dL — ABNORMAL HIGH (ref 70–99)
Glucose-Capillary: 147 mg/dL — ABNORMAL HIGH (ref 70–99)
Glucose-Capillary: 151 mg/dL — ABNORMAL HIGH (ref 70–99)
Glucose-Capillary: 167 mg/dL — ABNORMAL HIGH (ref 70–99)
Glucose-Capillary: 182 mg/dL — ABNORMAL HIGH (ref 70–99)

## 2020-02-07 LAB — MAGNESIUM: Magnesium: 2.5 mg/dL — ABNORMAL HIGH (ref 1.7–2.4)

## 2020-02-07 LAB — HEMOGLOBIN AND HEMATOCRIT, BLOOD
HCT: 28 % — ABNORMAL LOW (ref 36.0–46.0)
Hemoglobin: 8.4 g/dL — ABNORMAL LOW (ref 12.0–15.0)

## 2020-02-07 MED ORDER — HYDROCODONE-ACETAMINOPHEN 5-325 MG PO TABS
1.0000 | ORAL_TABLET | Freq: Three times a day (TID) | ORAL | Status: DC | PRN
  Administered 2020-02-07 – 2020-02-21 (×11): 1
  Filled 2020-02-07 (×12): qty 1

## 2020-02-07 MED ORDER — CHLORHEXIDINE GLUCONATE CLOTH 2 % EX PADS
6.0000 | MEDICATED_PAD | Freq: Every day | CUTANEOUS | Status: DC
  Administered 2020-02-08 – 2020-02-12 (×5): 6 via TOPICAL

## 2020-02-07 MED ORDER — ONDANSETRON HCL 4 MG/2ML IJ SOLN
4.0000 mg | Freq: Once | INTRAMUSCULAR | Status: AC
  Administered 2020-02-07: 4 mg via INTRAVENOUS
  Filled 2020-02-07: qty 2

## 2020-02-07 MED ORDER — HYDROCODONE-ACETAMINOPHEN 5-325 MG PO TABS: 1.0000 | ORAL_TABLET | Freq: Three times a day (TID) | ORAL | Status: DC | PRN

## 2020-02-07 NOTE — Progress Notes (Signed)
[facility] ROUNDING NOTE   Subjective:   This is a 74 year old female admitted 01/05/2020 from [facility] after having arrived from a long admission at [facility]  12/20-01/05/2020.  Where she had a PEA arrest following an endoscopy and ended up with a tracheostomy.  Her initial hospitalization in September 2021 was for acute cholecystectomy and cholecystitis.  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  Her hospital course has been complicated by MRSA pneumonia.  She was transferred from [facility] for volume overload and acidosis and underwent CRRT 01/13/2020 until 01/16/2020 where she has been tolerating hemodialysis.  She continues on a Tuesday Thursday Saturday schedule.  Plan is for placement back at [facility].  She has been refusing dialysis it appears her last dialysis treatment was 02/02/2020.    She underwent successful dialysis 02/06/2020 with removal of 1.7 L  Blood pressure 163/69 pulse 86 temperature 98.3 O2 sats 100% 3 L  Sodium 137 potassium 3.9 chloride 97 CO2 30 BUN 97 creatinine 1.9 glucose 143 calcium 8.8 phosphorus 4.4 magnesium 2.5 hemoglobin 8.4 platelets 128  BuSpar 5 mg twice a day, darbepoetin 100 mcg every Wednesday, insulin sliding scale, Protonix 40 mg every 24 hours, Paxil 40 mg daily, trazodone daily.  Objective:  Vital signs in last 24 hours:  Temp:  [98.1 F (36.7 C)-98.5 F (36.9 C)] 98.3 F (36.8 C) (04/03 0413) Pulse Rate:  [82-88] 86 (04/03 0413) Resp:  [16-18] 18 (04/03 0413) BP: (118-177)/(65-80) 163/67 (04/03 0413) SpO2:  [96 %-100 %] 100 % (04/03 0413)  Weight change:  Filed Weights   02/03/20 0500 02/04/20 0500  Weight: 85.3 kg 85.3 kg    Intake/Output: I/O last 3 completed shifts: In: 3040 [P.O.:120; NG/GT:2920] Out: 2760 [Urine:1075; QIHKV:4259]   Intake/Output this shift:  No intake/output data recorded.  Gen: Appears comfortable resting in bed CVS: Pulse regular rhythm, normal rate, S1 and S2 normal. Tracheostomy site  intact dressing. Resp: Clear to auscultation bilaterally, right IJ TDC. Abd: Soft, obese, nontender Ext: Trace-1+lower extremity edema   Basic Metabolic Panel: Recent Labs  Lab 02/03/20 0355 02/03/20 0355 02/04/20 0308 02/04/20 0308 02/05/20 0439 02/06/20 0431 02/07/20 0329  NA 139  --  136  --  136 135 137  K 4.1  --  4.3  --  4.4 4.6 3.9  CL 97*  --  96*  --  94* 94* 97*  CO2 29  --  30  --  '28 29 30  '$ GLUCOSE 142*  --  146*  --  160* 138* 143*  BUN 69*  --  92*  --  119* 144* 97*  CREATININE 1.63*  --  1.95*  --  2.17* 2.44* 1.91*  CALCIUM 8.4*   < > 8.7*   < > 8.7* 8.9 8.8*  MG 2.4  --  2.5*  --  2.7* 2.6* 2.5*  PHOS 3.9  --  5.0*  --  5.7* 6.1* 4.4   < > = values in this interval not displayed.    Liver Function Tests: Recent Labs  Lab 02/03/20 0355 02/04/20 0308 02/05/20 0439 02/06/20 0431 02/07/20 0329  ALBUMIN 2.6* 2.6* 2.5* 2.6* 2.6*   No results for input(s): LIPASE, AMYLASE in the last 168 hours. No results for input(s): AMMONIA in the last 168 hours.  CBC: Recent Labs  Lab 02/03/20 0355 02/03/20 0355 02/04/20 0308 02/05/20 0439 02/06/20 0431 02/06/20 2004 02/07/20 0329  WBC 5.2  --  5.5 6.3 5.7 5.2  --   HGB 8.4*   < >  8.6* 8.3* 8.3* 8.4* 8.4*  HCT 28.5*   < > 28.9* 28.0* 27.3* 28.2* 28.0*  MCV 97.6  --  97.0 95.9 94.1 94.0  --   PLT 148*  --  141* 143* 139* 128*  --    < > = values in this interval not displayed.    Cardiac Enzymes: No results for input(s): CKTOTAL, CKMB, CKMBINDEX, TROPONINI in the last 168 hours.  BNP: Invalid input(s): POCBNP  CBG: Recent Labs  Lab 02/06/20 1150 02/06/20 2011 02/06/20 2356 02/07/20 0407 02/07/20 0750  GLUCAP 142* 156* 114* 151* 147*    Microbiology: Results for orders placed or performed during the hospital encounter of 01/05/20  Blood culture (routine x 2)     Status: None   Collection Time: 01/05/20  8:04 PM   Specimen: BLOOD  Result Value Ref Range Status   Specimen Description  BLOOD PICC LINE  Final   Special Requests   Final    BOTTLES DRAWN AEROBIC AND ANAEROBIC Blood Culture adequate volume   Culture   Final    NO GROWTH 5 DAYS Performed at [facility], [place] [address], [place], [place] [address]    Report Status 01/10/2020 FINAL  Final  Blood culture (routine x 2)     Status: None   Collection Time: 01/05/20  8:04 PM   Specimen: BLOOD RIGHT HAND  Result Value Ref Range Status   Specimen Description BLOOD RIGHT HAND  Final   Special Requests   Final    BOTTLES DRAWN AEROBIC AND ANAEROBIC Blood Culture adequate volume   Culture   Final    NO GROWTH 5 DAYS Performed at [facility], [place] [address] [place], [place] [address]    Report Status 01/10/2020 FINAL  Final  Urine culture     Status: None   Collection Time: 01/05/20  8:04 PM   Specimen: Urine, Random  Result Value Ref Range Status   Specimen Description URINE, RANDOM  Final   Special Requests NONE  Final   Culture   Final    NO GROWTH Performed at [facility], [place] [address] [place], [place] [address]    Report Status 01/07/2020 FINAL  Final  Respiratory Panel by RT PCR (Flu A&B, Covid) - Nasopharyngeal Swab     Status: None   Collection Time: 01/05/20  8:04 PM   Specimen: Nasopharyngeal Swab  Result Value Ref Range Status   SARS Coronavirus 2 by RT PCR NEGATIVE NEGATIVE Final    Comment: (NOTE) SARS-CoV-2 target nucleic acids are NOT DETECTED. The SARS-CoV-2 RNA is generally detectable in upper respiratoy specimens during the acute phase of infection. The lowest concentration of SARS-CoV-2 viral copies this assay can detect is 131 copies/mL. A negative result does not preclude SARS-Cov-2 infection and should not be used as the sole basis for treatment or other patient management decisions. A negative result may occur with  improper specimen collection/handling, submission of specimen other than nasopharyngeal swab, presence of viral mutation(s) within the areas  targeted by this assay, and inadequate number of viral copies (<131 copies/mL). A negative result must be combined with clinical observations, patient history, and epidemiological information. The expected result is Negative. Fact Sheet for Patients:  PinkCheek.be Fact Sheet for Healthcare Providers:  GravelBags.it This test is not yet ap proved or cleared by the [place] FDA and  has been authorized for detection and/or diagnosis of SARS-CoV-2 by FDA under an Emergency Use Authorization (EUA). This EUA will  remain  in effect (meaning this test can be used) for the duration of the COVID-19 declaration under Section 564(b)(1) of the Act, 21 U.S.C. section 360bbb-3(b)(1), unless the authorization is terminated or revoked sooner.    Influenza A by PCR NEGATIVE NEGATIVE Final   Influenza B by PCR NEGATIVE NEGATIVE Final    Comment: (NOTE) The Xpert Xpress SARS-CoV-2/FLU/RSV assay is intended as an aid in  the diagnosis of influenza from Nasopharyngeal swab specimens and  should not be used as a sole basis for treatment. Nasal washings and  aspirates are unacceptable for Xpert Xpress SARS-CoV-2/FLU/RSV  testing. Fact Sheet for Patients: PinkCheek.be Fact Sheet for Healthcare Providers: GravelBags.it This test is not yet approved or cleared by the [place] FDA and  has been authorized for detection and/or diagnosis of SARS-CoV-2 by  FDA under an Emergency Use Authorization (EUA). This EUA will remain  in effect (meaning this test can be used) for the duration of the  Covid-19 declaration under Section 564(b)(1) of the Act, 21  U.S.C. section 360bbb-3(b)(1), unless the authorization is  terminated or revoked. Performed at [facility], [place] [address], [place], [place] [address]   MRSA PCR Screening     Status: Abnormal   Collection Time: 01/06/20  2:20 AM    Specimen: Nasopharyngeal  Result Value Ref Range Status   MRSA by PCR POSITIVE (A) NEGATIVE Final    Comment:        The GeneXpert MRSA Assay (FDA approved for NASAL specimens only), is one component of a comprehensive MRSA colonization surveillance program. It is not intended to diagnose MRSA infection nor to guide or monitor treatment for MRSA infections. RESULT CALLED TO, READ BACK BY AND VERIFIED WITH: STAPLETON,R RN [phone] MITCHELL,L   Culture, respiratory (non-expectorated)     Status: None   Collection Time: 01/08/20  9:36 AM   Specimen: Tracheal Aspirate; Respiratory  Result Value Ref Range Status   Specimen Description TRACHEAL ASPIRATE  Final   Special Requests NONE  Final   Gram Stain   Final    RARE WBC PRESENT, PREDOMINANTLY PMN RARE GRAM NEGATIVE RODS RARE GRAM POSITIVE COCCI IN PAIRS Performed at [facility], [place] [address] [place], [place] [address]    Culture RARE PSEUDOMONAS AERUGINOSA  Final   Report Status 01/10/2020 FINAL  Final   Organism ID, Bacteria PSEUDOMONAS AERUGINOSA  Final      Susceptibility   Pseudomonas aeruginosa - MIC*    CEFTAZIDIME 2 SENSITIVE Sensitive     CIPROFLOXACIN <=0.25 SENSITIVE Sensitive     GENTAMICIN <=1 SENSITIVE Sensitive     IMIPENEM 2 SENSITIVE Sensitive     PIP/TAZO <=4 SENSITIVE Sensitive     CEFEPIME 2 SENSITIVE Sensitive     * RARE PSEUDOMONAS AERUGINOSA    Coagulation Studies: No results for input(s): LABPROT, INR in the last 72 hours.  Urinalysis: No results for input(s): COLORURINE, LABSPEC, PHURINE, GLUCOSEU, HGBUR, BILIRUBINUR, KETONESUR, PROTEINUR, UROBILINOGEN, NITRITE, LEUKOCYTESUR in the last 72 hours.  Invalid input(s): APPERANCEUR    Imaging: No results found.   Medications:   . sodium chloride    . sodium chloride    . sodium chloride     . B-complex with vitamin C  1 tablet Per Tube Daily  . bisacodyl  10 mg Rectal Daily  . busPIRone  5 mg Per Tube BID  . chlorhexidine  gluconate (MEDLINE KIT)  15 mL Mouth Rinse BID  . Chlorhexidine Gluconate Cloth  6 each  Topical Q0600  . Chlorhexidine Gluconate Cloth  6 each Topical Q0600  . darbepoetin (ARANESP) injection - DIALYSIS  100 mcg Subcutaneous Q Wed-HD  . feeding supplement (NEPRO CARB STEADY)  1,000 mL Oral Q24H  . feeding supplement (PRO-STAT SUGAR FREE 64)  30 mL Per Tube TID  . guaiFENesin  10 mL Per Tube BID  . heparin  5,000 Units Subcutaneous Q8H  . insulin aspart  0-15 Units Subcutaneous Q4H  . pantoprazole sodium  40 mg Per Tube Q24H  . PARoxetine  40 mg Per Tube Daily  . sodium chloride flush  10-40 mL Intracatheter Q12H  . traZODone  25 mg Per Tube QHS   sodium chloride, sodium chloride, alteplase, fentaNYL (SUBLIMAZE) injection, heparin, lidocaine (PF), lidocaine-prilocaine, metoprolol tartrate, ondansetron (ZOFRAN) IV, pentafluoroprop-tetrafluoroeth, sodium chloride, sodium chloride flush  Assessment/ Plan:  1. 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 Previously on CRRT beginning 3/7 and later transitioned to intermittent hemodialysis after hemodynamic stability established. She is nonoliguric and with worsening azotemia on labs seen along with slow rise of creatinine. She has refused hemodialysis for the last 3 days her daughter is coming in today to help facilitate discussion/goals of care.   Appreciate assistance from Dr. Darleene Cleaver psychiatry.  Patient is uncooperative with capacity assessment or psychiatric evaluation.  She agreed to dialysis 02/06/2020.  We will schedule further treatment 02/07/2020. 2. Sepsis: Suspected to be of urinary source but appears to have now resolved status post antibiotic treatment. 3. History of PEA arrest status post tracheostomy; decannulated on 3/19 and appears to be having decent oxygenation since then. 4. Anemia: Secondary to chronic illness exacerbated by recent hospitalization-adequate iron  stores and now on ESA. 5. Disposition: Difficult situation,  refuses dialysis regularly and insists that "she does not want to die".     LOS: [place] '@TODAY''@9'$ :43 AM

## 2020-02-07 NOTE — Progress Notes (Signed)
PROGRESS NOTE    Terilynn Buresh  GYB:[id number] DOB: 1945-12-07 DOA: 01/05/2020 PCP: Patient, No Pcp Per   Brief Narrative:  HPI on 01/05/2020 by Mr. Georgann Housekeeper, NP (ICU) 74 year old female with PMH as below, which is significant for long admission in the [facility] system from 10/2019 all the way through 01/05/2020.  She was originally hospitalized in September 2020 with acute cholecystitis and underwent a cholecystectomy.  She was then sent to SNF for short duration but had return to [facility] to have a bile duct stent placed ultimately returning to SNF.  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  While hospitalized she developed fluid overload and was ultimately transferred to [facility] for dialysis.  Her course after that point become somewhat unclear, but as described by her family she was on and off the ventilator for several procedures and was eventually left intubated.  One of these procedures was an endoscopy during which she unfortunately suffered a cardiac arrest.  Details of the arrest are unclear.  She remained on the ventilator and ultimately underwent tracheostomy.  Her hospital course was also complicated by MRSA pneumonia.  She was ultimately able to come off of hemodialysis and was treated intermittently with diuresis.  It sounds like she bounced in and out of the ICU a few times with volume overload.  On the tail end of her admission she still struggled with kidney disease and was being treated for a urinary tract infection with cefepime and vancomycin.  Plans were to stop on 3/2.  She was discharged from the [facility] system on 3/1 to [facility] in [place].  Upon arrival to [facility] they deemed her too sick for admission and transferred her to [facility] emergency department with complaints of volume overload and acidosis.   Interim history Initially admitted by ICU this patient was ventilator dependent, into trach, which  is now been removed.  Patient started CRRT on 01/13/2020 until 01/16/2020 and transferred to the floor on 01/18/2020 as she was tolerating hemodialysis.  Nephrology has been seeing her and feels that she has AKI on CKD most likely being from ATN/sepsis.  Patient family wants aggressive measures.  CRRT was discontinued on 01/15/2020.  Patient now has [facility] and is scheduled for intermittent dialysis.  She refused to go to dialysis today. 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   -Psychiatry consulted for capacity, patient does not have capacity to make her own decisions -Palliative care also consulted and pending family meeting-continues to be full code -Patient did dialyze on 02/06/2020  MRSA pneumonia -Completed antibiotics  Chronic respiratory failure -Patient did have tracheostomy however was decannulated on 01/23/2020 -Continue supportive measures -Currently on 3-4L maintaining oxygen saturations in the high 90s to 100%  Diabetes mellitus, type II -Hemoglobin A1c 5.7 -Continue insulin sliding scale CBG monitoring  Essential hypertension -Continue PRN metoprolol  Anemia of chronic kidney disease -Continue Aranesp -Monitor CBC  Hyponatremia -Resolved  Anxiety/depression -Continue Paxil, BuSpar, trazodone  GERD -Continue PPI  Goals of care -Palliative care consulted, patient remains full code -Of note palliative care discussion was had with patient was at [facility].  Previous hospitalist discussed with her daughter, and understands prognosis is guarded. -Psychiatry consulted for capacity, patient does not have capacity to make her own decisions -Ethics  consulted  Nutritional support -She does not want to eat -currently has peg tube and on Tube feeds  DVT Prophylaxis  Heparin  Code Status:  Full  Family Communication: None at bedside  Disposition Plan: Admitted from [facility]. Now with renal failure needing HD, but continues to refuse. Dispo TBD. LTAC has declined patient. Disposition pending patient's ability to dialyze in a chair. Pending Palliative care and Ethics consultation.  Consultants Nephrology PCCM Psychiatry Palliative care Interventional radiology Ethics  Procedures  Echcoardiogram 01/06/2020 shows an EF of 65 to 03%, grade 2 diastolic dysfunction.  RVSP.  Antibiotics   Anti-infectives (From admission, onward)   Start     Dose/Rate Route Frequency Ordered Stop   01/10/20 0930  vancomycin (VANCOCIN) IVPB 1000 mg/200 mL premix  Status:  Discontinued     1,000 mg 200 mL/hr over 60 Minutes Intravenous  Once 01/10/20 0915 01/13/20 0623   01/06/20 2200  ceFEPIme (MAXIPIME) 2 g in sodium chloride 0.9 % 100 mL IVPB     2 g 200 mL/hr over 30 Minutes Intravenous Every 24 hours 01/05/20 2251 01/11/20 2234   01/05/20 2200  ceFEPIme (MAXIPIME) 2 g in sodium chloride 0.9 % 100 mL IVPB     2 g 200 mL/hr over 30 Minutes Intravenous  Once 01/05/20 2148 01/06/20 0015   01/05/20 2200  metroNIDAZOLE (FLAGYL) IVPB 500 mg     500 mg 100 mL/hr over 60 Minutes Intravenous  Once 01/05/20 2148 01/06/20 0015   01/05/20 2200  vancomycin (VANCOCIN) IVPB 1000 mg/200 mL premix  Status:  Discontinued     1,000 mg 200 mL/hr over 60 Minutes Intravenous  Once 01/05/20 2148 01/05/20 2151   01/05/20 2200  vancomycin (VANCOREADY) IVPB 2000 mg/400 mL  Status:  Discontinued     2,000 mg 200 mL/hr over 120 Minutes Intravenous  Once 01/05/20 2151 01/05/20 2223      Subjective:   [name] seen and examined today.  States that everything is bothering her today.  Cannot be more specific.  Wishes to be left alone.    Objective:   Vitals:   02/06/20 1835 02/06/20 1845 02/06/20 2020 02/07/20 0413  BP: 118/65 (!) 146/68 (!) 177/73 (!) 163/67  Pulse: 82 83 84 86  Resp:  '16 16 18  '$ Temp:   98.2 F (36.8 C) 98.5 F (36.9 C) 98.3 F (36.8 C)  TempSrc:  Oral Oral Oral  SpO2:  96% 99% 100%  Weight:      Height:        Intake/Output Summary (Last 24 hours) at 02/07/2020 1135 Last data filed at 02/07/2020 0600 Gross per 24 hour  Intake 1080 ml  Output 1860 ml  Net -780 ml   Filed Weights   02/03/20 0500 02/04/20 0500  Weight: 85.3 kg 85.3 kg   Exam  General: Well developed, chronically ill-appearing, NAD  HEENT: NCAT, mucous membranes moist.   Neck: Supple, no JVD, no masses  Cardiovascular: S1 S2 auscultated, RRR, no murmur  Respiratory: Clear to auscultation bilaterally   Abdomen: Soft, nontender, nondistended, + bowel sounds, PEG  Extremities: warm dry without cyanosis clubbing.  LE edema  Neuro: AAOx 1 (self only), nonfocal  Psych: Flat  Data Reviewed: I have personally reviewed following labs and imaging studies  CBC: Recent Labs  Lab 02/03/20 0355 02/03/20 0355 02/04/20 0308 02/05/20 0439 02/06/20 0431 02/06/20 2004 02/07/20 0329  WBC 5.2  --  5.5 6.3 5.7 5.2  --   HGB 8.4*   < > 8.6*  8.3* 8.3* 8.4* 8.4*  HCT 28.5*   < > 28.9* 28.0* 27.3* 28.2* 28.0*  MCV 97.6  --  97.0 95.9 94.1 94.0  --   PLT 148*  --  141* 143* 139* 128*  --    < > = values in this interval not displayed.   Basic Metabolic Panel: Recent Labs  Lab 02/03/20 0355 02/04/20 0308 02/05/20 0439 02/06/20 0431 02/07/20 0329  NA 139 136 136 135 137  K 4.1 4.3 4.4 4.6 3.9  CL 97* 96* 94* 94* 97*  CO2 '29 30 28 29 30  '$ GLUCOSE 142* 146* 160* 138* 143*  BUN 69* 92* 119* 144* 97*  CREATININE 1.63* 1.95* 2.17* 2.44* 1.91*  CALCIUM 8.4* 8.7* 8.7* 8.9 8.8*  MG 2.4 2.5* 2.7* 2.6* 2.5*  PHOS 3.9 5.0* 5.7* 6.1* 4.4   GFR: Estimated Creatinine Clearance: 28.5 mL/min (A) (by C-G formula based on SCr of 1.91 mg/dL (H)). Liver Function Tests: Recent Labs  Lab 02/03/20 0355 02/04/20 0308 02/05/20 0439 02/06/20 0431 02/07/20 0329  ALBUMIN 2.6* 2.6* 2.5* 2.6* 2.6*   No  results for input(s): LIPASE, AMYLASE in the last 168 hours. No results for input(s): AMMONIA in the last 168 hours. Coagulation Profile: No results for input(s): INR, PROTIME in the last 168 hours. Cardiac Enzymes: No results for input(s): CKTOTAL, CKMB, CKMBINDEX, TROPONINI in the last 168 hours. BNP (last 3 results) No results for input(s): PROBNP in the last 8760 hours. HbA1C: No results for input(s): HGBA1C in the last 72 hours. CBG: Recent Labs  Lab 02/06/20 1150 02/06/20 2011 02/06/20 2356 02/07/20 0407 02/07/20 0750  GLUCAP 142* 156* 114* 151* 147*   Lipid Profile: No results for input(s): CHOL, HDL, LDLCALC, TRIG, CHOLHDL, LDLDIRECT in the last 72 hours. Thyroid Function Tests: No results for input(s): TSH, T4TOTAL, FREET4, T3FREE, THYROIDAB in the last 72 hours. Anemia Panel: No results for input(s): VITAMINB12, FOLATE, FERRITIN, TIBC, IRON, RETICCTPCT in the last 72 hours. Urine analysis:    Component Value Date/Time   COLORURINE AMBER (A) 01/13/2020 1302   APPEARANCEUR TURBID (A) 01/13/2020 1302   LABSPEC 1.018 01/13/2020 1302   PHURINE 5.0 01/13/2020 1302   GLUCOSEU NEGATIVE 01/13/2020 1302   HGBUR SMALL (A) 01/13/2020 1302   BILIRUBINUR NEGATIVE 01/13/2020 1302   KETONESUR NEGATIVE 01/13/2020 1302   PROTEINUR >=300 (A) 01/13/2020 1302   NITRITE NEGATIVE 01/13/2020 1302   LEUKOCYTESUR LARGE (A) 01/13/2020 1302   Sepsis Labs: '@LABRCNTIP'$ (procalcitonin:4,lacticidven:4)  )No results found for this or any previous visit (from the past 240 hour(s)).    Radiology Studies: No results found.   Scheduled Meds: . B-complex with vitamin C  1 tablet Per Tube Daily  . bisacodyl  10 mg Rectal Daily  . busPIRone  5 mg Per Tube BID  . chlorhexidine gluconate (MEDLINE KIT)  15 mL Mouth Rinse BID  . Chlorhexidine Gluconate Cloth  6 each Topical Q0600  . darbepoetin (ARANESP) injection - DIALYSIS  100 mcg Subcutaneous Q Wed-HD  . feeding supplement (NEPRO CARB  STEADY)  1,000 mL Oral Q24H  . feeding supplement (PRO-STAT SUGAR FREE 64)  30 mL Per Tube TID  . guaiFENesin  10 mL Per Tube BID  . heparin  5,000 Units Subcutaneous Q8H  . insulin aspart  0-15 Units Subcutaneous Q4H  . pantoprazole sodium  40 mg Per Tube Q24H  . PARoxetine  40 mg Per Tube Daily  . sodium chloride flush  10-40 mL Intracatheter Q12H  . traZODone  25 mg Per  Tube QHS   Continuous Infusions: . sodium chloride    . sodium chloride    . sodium chloride       LOS: 33 days   Time Spent in minutes   30 minutes  Maliq Pilley D.O. on 02/07/2020 at 11:35 AM  Between 7am to 7pm - Please see pager noted on [email or web address]  After 7pm go to [email or web address]  And look for the night coverage person covering for me after hours  [facility] Office  [phone]

## 2020-02-07 NOTE — Progress Notes (Signed)
Daily Progress Note   Patient Name: Bailey Cox       Date: 02/07/2020 DOB: [birth year]  Age: 74 y.o. MRN#: [id number] Attending Physician: Cristal Ford, DO Primary Care Physician: Patient, No Pcp Per Admit Date: 01/05/2020  Reason for Consultation/Follow-up: Establishing goals of care  Subjective: Patient awake, alert, oriented. Sitting up in recliner this afternoon. Flat affect. C/o of nausea. RN to give zofran. Feels better after her hair was washed and cut by daughter at bedside.  GOC:  F/u GOC with patient and daughter Katharine Look). Patient states dialysis went "ok" yesterday. She has been video chatting family with family this morning and appears in better spirits.   Discussed plan of care including possible HD again today (orders placed by Dr. Justin Mend). RN to keep her updated on the plan.   Per daughter, copy of patient's living will/POA documentation was faxed to [facility] but this document is unfortunately not found in paper chart. Another copy requested. Daughter will fax to [facility] Monday or Tuesday when she is back in [place].  Daughter completed MOST form (as we discussed yesterday). Patient wishes for FULL code/FULL scope treatment. She is not ready to die and not interested in hospice services. Plans for continued dialysis. Electronic [employer] MOST form completed. Ultimately, patient/daughter hopeful to get her closer to [facility] when stable for discharge. Patient will need SNF that will transport her to dialysis.  Discussed symptom management. Per chart review, dilaudid allergy (dyspnea and confusion). Daughter shares that patient has taken hydrocodone in the past and did not have adverse effects. Will add low dose hydrocodone for pain control, especially during dialysis. Daughter  agreeable.   Encouraged ongoing PT/OT efforts and video chats with family.   Answered questions and concerns. Daughter has PMT contact information. Ongoing support.    Length of Stay: 33  Current Medications: Scheduled Meds:  . B-complex with vitamin C  1 tablet Per Tube Daily  . bisacodyl  10 mg Rectal Daily  . busPIRone  5 mg Per Tube BID  . chlorhexidine gluconate (MEDLINE KIT)  15 mL Mouth Rinse BID  . Chlorhexidine Gluconate Cloth  6 each Topical Q0600  . darbepoetin (ARANESP) injection - DIALYSIS  100 mcg Subcutaneous Q Wed-HD  . feeding supplement (NEPRO CARB STEADY)  1,000 mL Oral Q24H  . feeding supplement (PRO-STAT SUGAR FREE  64)  30 mL Per Tube TID  . guaiFENesin  10 mL Per Tube BID  . heparin  5,000 Units Subcutaneous Q8H  . insulin aspart  0-15 Units Subcutaneous Q4H  . ondansetron (ZOFRAN) IV  4 mg Intravenous Once  . pantoprazole sodium  40 mg Per Tube Q24H  . PARoxetine  40 mg Per Tube Daily  . sodium chloride flush  10-40 mL Intracatheter Q12H  . traZODone  25 mg Per Tube QHS    Continuous Infusions: . sodium chloride    . sodium chloride    . sodium chloride      PRN Meds: sodium chloride, sodium chloride, alteplase, fentaNYL (SUBLIMAZE) injection, heparin, HYDROcodone-acetaminophen, lidocaine (PF), lidocaine-prilocaine, metoprolol tartrate, ondansetron (ZOFRAN) IV, pentafluoroprop-tetrafluoroeth, sodium chloride, sodium chloride flush  Physical Exam Vitals and nursing note reviewed.  Constitutional:      Appearance: She is ill-appearing.  HENT:     Head: Normocephalic and atraumatic.  Pulmonary:     Effort: No tachypnea, accessory muscle usage or respiratory distress.  Skin:    General: Skin is warm and dry.     Coloration: Skin is pale.  Neurological:     Mental Status: She is easily aroused.     Comments: Alert. Oriented to person/place/situation  Psychiatric:        Mood and Affect: Affect is flat.            Vital Signs: BP (!) 163/67 (BP  Location: Left Arm)   Pulse 86   Temp 98.3 F (36.8 C) (Oral)   Resp 18   Ht '5\' 4"'$  (1.626 m)   Wt 85.3 kg   SpO2 100%   BMI 32.28 kg/m  SpO2: SpO2: 100 % O2 Device: O2 Device: Nasal Cannula O2 Flow Rate: O2 Flow Rate (L/min): 3 L/min  Intake/output summary:   Intake/Output Summary (Last 24 hours) at 02/07/2020 1236 Last data filed at 02/07/2020 0600 Gross per 24 hour  Intake 1080 ml  Output 1860 ml  Net -780 ml   LBM: Last BM Date: 02/05/20 Baseline Weight: Weight: 113 kg Most recent weight: Weight: (unable to obtain correct weitht. )       Palliative Assessment/Data: PPS 40%    Flowsheet Rows     Most Recent Value  Intake Tab  Referral Department  Hospitalist  Unit at Time of Referral  Med/Surg Unit  Palliative Care Primary Diagnosis  Nephrology  Date Notified  01/29/20  Palliative Care Type  New Palliative care  Reason for referral  Clarify Goals of Care  Date of Admission  01/05/20  Date first seen by Palliative Care  02/02/20  # of days Palliative referral response time  4 Day(s)  # of days IP prior to Palliative referral  24  Clinical Assessment  Psychosocial & Spiritual Assessment  Palliative Care Outcomes      Patient Active Problem List   Diagnosis Date Noted  . Depression, recurrent ([place])   . Abdominal pain   . Goals of care, counseling/discussion   . Acute hypoxemic respiratory failure ([place])   . Acute kidney injury ([place])   . Anemia due to stage 4 chronic kidney disease ([place])   . Hypotension   . Sepsis ([place])   . Renal failure 01/06/2020  . Tracheostomy status ([place]) 01/06/2020  . Chronic hypoxemic respiratory failure ([place]) 01/06/2020  . Shock ([place]) 01/05/2020    Palliative Care Assessment & Plan   Patient Profile: 74 y.o. female  with past medical history of cholecystectomy with CBD stenting  in September followed by PEA arrest during endoscopy that lead to hospitalization from December 2020 until January 05, 2020 at [facility] 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 She was discharged to [facility] on 3/1 who immediately sent her to [facility].  On admission 01/05/2020 she was found to have acidosis with acute on chronic kidney failure.  Unfortunately she has not had renal recovery.  Assessment: AKI on CKD stage IV MRSA pneumonia Chronic respiratory, now decannulated DM type II Essential hypertension Anemia of CKD Anxiety Depression  Recommendations/Plan:  Continue full code/full scope treatment. Patient is not ready to die and not interested in hospice services.   Ongoing discussions pending her participation with therapy and compliance with hemodialysis. Will consider ethics discussion with daughter next week if patient continues to refuse care when daughter leaves town.   PT/OT/SLP  Recommend attempting iPAD video chats with family when they are unavailable to visit [facility] during the week.   Manage pain/nausea during dialysis. PRN's available.   MOST form completed with patient's current wishes for FULL code/FULL scope treatment.   Did not receive copy of patient's advance directive. Requested again. Daughter will fax to [facility] on Monday.   Goals of Care and Additional Recommendations:  Limitations on Scope of Treatment: Full Scope Treatment  Code Status: FULL   Code Status Orders  (From admission, onward)         Start     Ordered   01/05/20 2350  Full code  Continuous     01/05/20 2352        Code Status History    This patient has a current code status but no historical code status.   Advance Care Planning Activity       Prognosis:  Poor prognosis  Discharge Planning:   To Be Determined  Care plan was discussed with RN, patient, daughter  Thank you for allowing the Palliative Medicine Team to assist in the care of this patient.   Time In: 1200 Time Out: 1235 Total Time 35 Prolonged Time Billed no       Greater than 50%  of this time was spent counseling and coordinating care related to the above assessment and plan.  Ihor Dow, DNP, FNP-C Palliative Medicine Team  Phone: [phone] Fax: [phone]  Please contact Palliative Medicine Team phone at [phone] for questions and concerns.

## 2020-02-07 NOTE — Evaluation (Signed)
Physical Therapy Evaluation Patient Details Name: Bailey Cox MRN: [id number] DOB: 1946/10/16 Today's Date: 02/07/2020   History of Present Illness  Pt is a 74 y/o female with past medical history of cholecystectomy with CBD stenting in September followed by PEA arrest during endoscopy that lead to hospitalization from December 2020 until January 05, 2020 at [facility]). 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 She was discharged to [facility] on 3/1 who immediately sent her to [facility].  On admission 01/05/2020 she was found to have acidosis with acute on chronic kidney failure.     Clinical Impression  Pt presented seated OOB in recliner chair with maximove lift pad placed underneath of her. Pt's daughter was present throughout session as well. Pt was previously evaluated on 01/16/20 by PT; however, she refused several times and PT signed off. New order received and evaluated this date. Pt remains less than enthusiastic about participating in therapy. She was only agreeable to lower level therex with assistance while seated in chair (see below). PT reiterated importance of participation in therapy if pt's ultimate end goal is to return home. Currently recommending pt d/c to SNF for further intensive therapy services to maximize her independence with functional mobility. Pt would continue to benefit from skilled physical therapy services at this time while admitted and after d/c to address the below listed limitations in order to improve overall safety and independence with functional mobility.      Follow Up Recommendations SNF;Supervision/Assistance - 24 hour    Equipment Recommendations  Hospital bed;Other (comment)(hoyer lift)    Recommendations for Other Services       Precautions / Restrictions Precautions Precautions: Fall Restrictions Weight Bearing Restrictions: No      Mobility  Bed  Mobility               General bed mobility comments: pt seated in recliner chair with maximove lift pad underneath of her. From previous evaluation this admission, pt required total A for bed mobility   Transfers                 General transfer comment: maximove was utilized for OOB transfer by nursing staff prior to arrival; pt unwilling to attempt any further mobility at this time  Ambulation/Gait                Stairs            Wheelchair Mobility    Modified Rankin (Stroke Patients Only)       Balance                                             Pertinent Vitals/Pain Pain Assessment: Faces Faces Pain Scale: Hurts a little bit Pain Location: "all over" Pain Descriptors / Indicators: Guarding;Grimacing Pain Intervention(s): Monitored during session;Repositioned    Home Living Family/patient expects to be discharged to:: Skilled nursing facility                      Prior Function Level of Independence: Needs assistance   Gait / Transfers Assistance Needed: pt stated she used a RW and was assisted by family           Hand Dominance        Extremity/Trunk Assessment   Upper Extremity Assessment Upper Extremity Assessment:  Generalized weakness    Lower Extremity Assessment Lower Extremity Assessment: Generalized weakness    Cervical / Trunk Assessment Cervical / Trunk Assessment: Kyphotic  Communication   Communication: Expressive difficulties  Cognition Arousal/Alertness: Awake/alert Behavior During Therapy: Flat affect;Anxious Overall Cognitive Status: Impaired/Different from baseline Area of Impairment: Memory;Following commands;Safety/judgement;Awareness;Problem solving                     Memory: Decreased short-term memory;Decreased recall of precautions Following Commands: Follows one step commands inconsistently Safety/Judgement: Decreased awareness of deficits;Decreased awareness  of safety Awareness: Intellectual Problem Solving: Slow processing;Decreased initiation;Difficulty sequencing;Requires verbal cues;Requires tactile cues        General Comments      Exercises General Exercises - Lower Extremity Ankle Circles/Pumps: AROM;Both;20 reps;Seated Quad Sets: AROM;Strengthening;Both;10 reps;Seated Gluteal Sets: AROM;Strengthening;Both;10 reps;Seated Heel Slides: AAROM;Both;10 reps;Seated Hip ABduction/ADduction: AAROM;Both;10 reps;Seated Other Exercises Other Exercises: AROM elbow flexion and extension bilaterally x5 (hand to mouth)   Assessment/Plan    PT Assessment Patient needs continued PT services  PT Problem List Decreased strength;Decreased range of motion;Decreased activity tolerance;Decreased balance;Decreased mobility;Decreased coordination;Decreased cognition;Decreased knowledge of use of DME;Decreased safety awareness;Decreased knowledge of precautions;Pain       PT Treatment Interventions DME instruction;Functional mobility training;Therapeutic activities;Balance training;Therapeutic exercise;Neuromuscular re-education;Cognitive remediation;Patient/family education;Wheelchair mobility training    PT Goals (Current goals can be found in the Care Plan section)  Acute Rehab PT Goals Patient Stated Goal: to go home PT Goal Formulation: With patient/family Time For Goal Achievement: 02/21/20 Potential to Achieve Goals: Fair    Frequency Min 2X/week   Barriers to discharge        Co-evaluation               AM-PAC PT "6 Clicks" Mobility  Outcome Measure Help needed turning from your back to your side while in a flat bed without using bedrails?: Total Help needed moving from lying on your back to sitting on the side of a flat bed without using bedrails?: Total Help needed moving to and from a bed to a chair (including a wheelchair)?: Total Help needed standing up from a chair using your arms (e.g., wheelchair or bedside chair)?:  Total Help needed to walk in hospital room?: Total Help needed climbing 3-5 steps with a railing? : Total 6 Click Score: 6    End of Session   Activity Tolerance: Patient limited by fatigue;Patient limited by pain;Other (comment)(self-limiting) Patient left: in chair;with call bell/phone within reach;with family/visitor present Nurse Communication: Mobility status;Need for lift equipment PT Visit Diagnosis: Other abnormalities of gait and mobility (R26.89);Muscle weakness (generalized) (M62.81)    Time: 1275-1700 PT Time Calculation (min) (ACUTE ONLY): 18 min   Charges:   PT Evaluation $PT Eval Moderate Complexity: 1 Mod          Eduard Clos, PT, DPT  Acute Rehabilitation Services Pager [phone] Office [place] 02/07/2020, 3:48 PM

## 2020-02-08 DIAGNOSIS — N179 Acute kidney failure, unspecified: Secondary | ICD-10-CM | POA: Diagnosis not present

## 2020-02-08 DIAGNOSIS — N184 Chronic kidney disease, stage 4 (severe): Secondary | ICD-10-CM | POA: Diagnosis not present

## 2020-02-08 DIAGNOSIS — J9601 Acute respiratory failure with hypoxia: Secondary | ICD-10-CM | POA: Diagnosis not present

## 2020-02-08 DIAGNOSIS — R109 Unspecified abdominal pain: Secondary | ICD-10-CM | POA: Diagnosis not present

## 2020-02-08 LAB — RENAL FUNCTION PANEL
Albumin: 2.6 g/dL — ABNORMAL LOW (ref 3.5–5.0)
Anion gap: 12 (ref 5–15)
BUN: 122 mg/dL — ABNORMAL HIGH (ref 8–23)
CO2: 28 mmol/L (ref 22–32)
Calcium: 8.7 mg/dL — ABNORMAL LOW (ref 8.9–10.3)
Chloride: 97 mmol/L — ABNORMAL LOW (ref 98–111)
Creatinine, Ser: 2.05 mg/dL — ABNORMAL HIGH (ref 0.44–1.00)
GFR calc Af Amer: 28 mL/min — ABNORMAL LOW (ref >60)
GFR calc non Af Amer: 24 mL/min — ABNORMAL LOW (ref >60)
Glucose, Bld: 136 mg/dL — ABNORMAL HIGH (ref 70–99)
Phosphorus: 4.9 mg/dL — ABNORMAL HIGH (ref 2.5–4.6)
Potassium: 4.3 mmol/L (ref 3.5–5.1)
Sodium: 137 mmol/L (ref 135–145)

## 2020-02-08 LAB — MAGNESIUM: Magnesium: 2.6 mg/dL — ABNORMAL HIGH (ref 1.7–2.4)

## 2020-02-08 LAB — GLUCOSE, CAPILLARY
Glucose-Capillary: 133 mg/dL — ABNORMAL HIGH (ref 70–99)
Glucose-Capillary: 123 mg/dL — ABNORMAL HIGH (ref 70–99)
Glucose-Capillary: 144 mg/dL — ABNORMAL HIGH (ref 70–99)
Glucose-Capillary: 113 mg/dL — ABNORMAL HIGH (ref 70–99)
Glucose-Capillary: 179 mg/dL — ABNORMAL HIGH (ref 70–99)

## 2020-02-08 NOTE — Progress Notes (Signed)
Received report from primary nurse Ludwig Clarks RN for hemodialysis, nurse states pt. Is refusing to come to HD states pt. Is saying "no no no". Dr. Jonnie Finner called and made aware. HD on hold for now. Dr. Jonnie Finner will notify Dr. Justin Mend

## 2020-02-08 NOTE — Progress Notes (Signed)
PROGRESS NOTE    Stephanny Tsutsui  OMB:[id number] DOB: 1946-08-17 DOA: 01/05/2020 PCP: Patient, No Pcp Per   Brief Narrative:  HPI on 01/05/2020 by Mr. Georgann Housekeeper, NP (ICU) 74 year old female with PMH as below, which is significant for long admission in the [facility] system from 10/2019 all the way through 01/05/2020.  She was originally hospitalized in September 2020 with acute cholecystitis and underwent a cholecystectomy.  She was then sent to SNF for short duration but had return to [facility] to have a bile duct stent placed ultimately returning to SNF.  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  While hospitalized she developed fluid overload and was ultimately transferred to [facility] for dialysis.  Her course after that point become somewhat unclear, but as described by her family she was on and off the ventilator for several procedures and was eventually left intubated.  One of these procedures was an endoscopy during which she unfortunately suffered a cardiac arrest.  Details of the arrest are unclear.  She remained on the ventilator and ultimately underwent tracheostomy.  Her hospital course was also complicated by MRSA pneumonia.  She was ultimately able to come off of hemodialysis and was treated intermittently with diuresis.  It sounds like she bounced in and out of the ICU a few times with volume overload.  On the tail end of her admission she still struggled with kidney disease and was being treated for a urinary tract infection with cefepime and vancomycin.  Plans were to stop on 3/2.  She was discharged from the [facility] system on 3/1 to [facility] in [place].  Upon arrival to [facility] they deemed her too sick for admission and transferred her to [facility] emergency department with complaints of volume overload and acidosis.   Interim history Initially admitted by ICU this patient was ventilator dependent, into trach, which  is now been removed.  Patient started CRRT on 01/13/2020 until 01/16/2020 and transferred to the floor on 01/18/2020 as she was tolerating hemodialysis.  Nephrology has been seeing her and feels that she has AKI on CKD most likely being from ATN/sepsis.  Patient family wants aggressive measures.  CRRT was discontinued on 01/15/2020.  Patient now has [facility] and is scheduled for intermittent dialysis.  She refused to go to dialysis today. 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   -Psychiatry consulted for capacity, patient does not have capacity to make her own decisions -Palliative care also consulted and pending family meeting-continues to be full code -Patient did dialyze on 02/06/2020 however refusing today  MRSA pneumonia -Completed antibiotics  Chronic respiratory failure -Patient did have tracheostomy however was decannulated on 01/23/2020 -Continue supportive measures -Currently on 3-4L maintaining oxygen saturations in the high 90s to 100%  Diabetes mellitus, type II -Hemoglobin A1c 5.7 -Continue insulin sliding scale CBG monitoring  Essential hypertension -Continue PRN metoprolol  Anemia of chronic kidney disease -Continue Aranesp -Monitor CBC  Hyponatremia -Resolved  Anxiety/depression -Continue Paxil, BuSpar, trazodone  GERD -Continue PPI  Goals of care -Palliative care consulted, patient remains full code -Of note palliative care discussion was had with patient was at [facility].  Previous hospitalist discussed with her daughter, and understands prognosis is guarded. -Psychiatry consulted for capacity, patient does not have capacity to make her  own decisions -Ethics consulted  Nutritional support -She does not want to eat -currently has peg tube and on Tube feeds  DVT Prophylaxis   Heparin  Code Status: Full  Family Communication: None at bedside  Disposition Plan: Admitted from [place]. Now with renal failure needing HD, but continues to refuse. Dispo TBD. LTAC has declined patient. Disposition pending patient's ability to dialyze in a chair. Pending Palliative care and Ethics consultation.  Consultants Nephrology PCCM Psychiatry Palliative care Interventional radiology Ethics  Procedures  Echcoardiogram 01/06/2020 shows an EF of 65 to 90%, grade 2 diastolic dysfunction.  RVSP.  Antibiotics   Anti-infectives (From admission, onward)   Start     Dose/Rate Route Frequency Ordered Stop   01/10/20 0930  vancomycin (VANCOCIN) IVPB 1000 mg/200 mL premix  Status:  Discontinued     1,000 mg 200 mL/hr over 60 Minutes Intravenous  Once 01/10/20 0915 01/13/20 0623   01/06/20 2200  ceFEPIme (MAXIPIME) 2 g in sodium chloride 0.9 % 100 mL IVPB     2 g 200 mL/hr over 30 Minutes Intravenous Every 24 hours 01/05/20 2251 01/11/20 2234   01/05/20 2200  ceFEPIme (MAXIPIME) 2 g in sodium chloride 0.9 % 100 mL IVPB     2 g 200 mL/hr over 30 Minutes Intravenous  Once 01/05/20 2148 01/06/20 0015   01/05/20 2200  metroNIDAZOLE (FLAGYL) IVPB 500 mg     500 mg 100 mL/hr over 60 Minutes Intravenous  Once 01/05/20 2148 01/06/20 0015   01/05/20 2200  vancomycin (VANCOCIN) IVPB 1000 mg/200 mL premix  Status:  Discontinued     1,000 mg 200 mL/hr over 60 Minutes Intravenous  Once 01/05/20 2148 01/05/20 2151   01/05/20 2200  vancomycin (VANCOREADY) IVPB 2000 mg/400 mL  Status:  Discontinued     2,000 mg 200 mL/hr over 120 Minutes Intravenous  Once 01/05/20 2151 01/05/20 2223      Subjective:   [name] seen and examined today.  Has no complaints today, denies chest pain, shortness of breath. Does not feel like talking today.    Objective:   Vitals:   02/07/20 1328 02/07/20 2028 02/08/20 0441 02/08/20 0453  BP: (!) 186/91 (!) 166/76  (!) 152/77  Pulse: 88 92  88  Resp: '18  15  15  '$ Temp: 98.3 F (36.8 C) 99.1 F (37.3 C)  98.5 F (36.9 C)  TempSrc:  Oral  Oral  SpO2: 99% 94%  100%  Weight:   82.6 kg   Height:        Intake/Output Summary (Last 24 hours) at 02/08/2020 1040 Last data filed at 02/08/2020 2409 Gross per 24 hour  Intake 1281.33 ml  Output 250 ml  Net 1031.33 ml   Filed Weights   02/04/20 0500 02/08/20 0441  Weight: 85.3 kg 82.6 kg   Exam  General: Well developed, chronically ill-appearing, NAD  HEENT: NCAT, mucous membranes moist.   Cardiovascular: S1 S2 auscultated, RRR, no murmur  Respiratory: Clear to auscultation bilaterally  Abdomen: Soft, nontender, nondistended, + bowel sounds, PEG  Extremities: warm dry without cyanosis clubbing. LE edema   Neuro: AAOx1, nonfocal   Psych: Flat   Data Reviewed: I have personally reviewed following labs and imaging studies  CBC: Recent Labs  Lab 02/03/20 0355 02/03/20 0355 02/04/20 0308 02/05/20 0439 02/06/20 0431 02/06/20 2004 02/07/20 0329  WBC 5.2  --  5.5 6.3 5.7 5.2  --   HGB 8.4*   < > 8.6* 8.3* 8.3* 8.4* 8.4*  HCT 28.5*   < >  28.9* 28.0* 27.3* 28.2* 28.0*  MCV 97.6  --  97.0 95.9 94.1 94.0  --   PLT 148*  --  141* 143* 139* 128*  --    < > = values in this interval not displayed.   Basic Metabolic Panel: Recent Labs  Lab 02/04/20 0308 02/05/20 0439 02/06/20 0431 02/07/20 0329 02/08/20 0339  NA 136 136 135 137 137  K 4.3 4.4 4.6 3.9 4.3  CL 96* 94* 94* 97* 97*  CO2 '30 28 29 30 28  '$ GLUCOSE 146* 160* 138* 143* 136*  BUN 92* 119* 144* 97* 122*  CREATININE 1.95* 2.17* 2.44* 1.91* 2.05*  CALCIUM 8.7* 8.7* 8.9 8.8* 8.7*  MG 2.5* 2.7* 2.6* 2.5* 2.6*  PHOS 5.0* 5.7* 6.1* 4.4 4.9*   GFR: Estimated Creatinine Clearance: 26.2 mL/min (A) (by C-G formula based on SCr of 2.05 mg/dL (H)). Liver Function Tests: Recent Labs  Lab 02/04/20 0308 02/05/20 0439 02/06/20 0431 02/07/20 0329 02/08/20 0339  ALBUMIN 2.6* 2.5* 2.6* 2.6* 2.6*   No results for input(s):  LIPASE, AMYLASE in the last 168 hours. No results for input(s): AMMONIA in the last 168 hours. Coagulation Profile: No results for input(s): INR, PROTIME in the last 168 hours. Cardiac Enzymes: No results for input(s): CKTOTAL, CKMB, CKMBINDEX, TROPONINI in the last 168 hours. BNP (last 3 results) No results for input(s): PROBNP in the last 8760 hours. HbA1C: No results for input(s): HGBA1C in the last 72 hours. CBG: Recent Labs  Lab 02/07/20 1649 02/07/20 2022 02/07/20 2343 02/08/20 0416 02/08/20 0806  GLUCAP 182* 147* 129* 133* 123*   Lipid Profile: No results for input(s): CHOL, HDL, LDLCALC, TRIG, CHOLHDL, LDLDIRECT in the last 72 hours. Thyroid Function Tests: No results for input(s): TSH, T4TOTAL, FREET4, T3FREE, THYROIDAB in the last 72 hours. Anemia Panel: No results for input(s): VITAMINB12, FOLATE, FERRITIN, TIBC, IRON, RETICCTPCT in the last 72 hours. Urine analysis:    Component Value Date/Time   COLORURINE AMBER (A) 01/13/2020 1302   APPEARANCEUR TURBID (A) 01/13/2020 1302   LABSPEC 1.018 01/13/2020 1302   PHURINE 5.0 01/13/2020 1302   GLUCOSEU NEGATIVE 01/13/2020 1302   HGBUR SMALL (A) 01/13/2020 1302   BILIRUBINUR NEGATIVE 01/13/2020 1302   KETONESUR NEGATIVE 01/13/2020 1302   PROTEINUR >=300 (A) 01/13/2020 1302   NITRITE NEGATIVE 01/13/2020 1302   LEUKOCYTESUR LARGE (A) 01/13/2020 1302   Sepsis Labs: '@LABRCNTIP'$ (procalcitonin:4,lacticidven:4)  )No results found for this or any previous visit (from the past 240 hour(s)).    Radiology Studies: No results found.   Scheduled Meds: . B-complex with vitamin C  1 tablet Per Tube Daily  . bisacodyl  10 mg Rectal Daily  . busPIRone  5 mg Per Tube BID  . chlorhexidine gluconate (MEDLINE KIT)  15 mL Mouth Rinse BID  . Chlorhexidine Gluconate Cloth  6 each Topical Q0600  . darbepoetin (ARANESP) injection - DIALYSIS  100 mcg Subcutaneous Q Wed-HD  . feeding supplement (NEPRO CARB STEADY)  1,000 mL Oral Q24H   . feeding supplement (PRO-STAT SUGAR FREE 64)  30 mL Per Tube TID  . guaiFENesin  10 mL Per Tube BID  . heparin  5,000 Units Subcutaneous Q8H  . insulin aspart  0-15 Units Subcutaneous Q4H  . pantoprazole sodium  40 mg Per Tube Q24H  . PARoxetine  40 mg Per Tube Daily  . sodium chloride flush  10-40 mL Intracatheter Q12H  . traZODone  25 mg Per Tube QHS   Continuous Infusions: . sodium chloride    .  sodium chloride    . sodium chloride       LOS: 34 days   Time Spent in minutes   30 minutes  Farid Grigorian D.O. on 02/08/2020 at 10:40 AM  Between 7am to 7pm - Please see pager noted on [email or web address]  After 7pm go to [email or web address]  And look for the night coverage person covering for me after hours  [facility] Office  [phone]

## 2020-02-08 NOTE — Progress Notes (Signed)
[facility] ROUNDING NOTE   Subjective:   This is a 74 year old female admitted 01/05/2020 from [facility] after having arrived from a long admission at [facility].  12/20-01/05/2020.  Where she had a PEA arrest following an endoscopy and ended up with a tracheostomy.  Her initial hospitalization in September 2021 was for acute cholecystectomy and cholecystitis.  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  Her hospital course has been complicated by MRSA pneumonia.  She was transferred from [facility] for volume overload and acidosis and underwent CRRT 01/13/2020 until 01/16/2020 where she has been tolerating hemodialysis.  She continues on a Tuesday Thursday Saturday schedule.  Plan is for placement back at [facility].  She has been refusing dialysis it appears her last dialysis treatment was 02/02/2020.    She underwent successful dialysis 02/06/2020 with removal of 1.7 L.  She continues to refuse dialysis.  Blood pressure 152/77 pulse 88 temperature 98.5 O2 sats   100% 2 L  Sodium 137 potassium 4.3 chloride 97 CO2 28 BUN 122 creatinine 2.05 glucose 136 calcium 8.7 phosphorus 4.9 albumin 2.6 magnesium 2.6 hemoglobin 8.4  BuSpar 5 mg twice a day, darbepoetin 100 mcg every Wednesday, insulin sliding scale, Protonix 40 mg every 24 hours, Paxil 40 mg daily, trazodone daily.  Objective:  Vital signs in last 24 hours:  Temp:  [98.3 F (36.8 C)-99.1 F (37.3 C)] 98.5 F (36.9 C) (04/04 0453) Pulse Rate:  [88-92] 88 (04/04 0453) Resp:  [15-18] 15 (04/04 0453) BP: (152-186)/(76-91) 152/77 (04/04 0453) SpO2:  [94 %-100 %] 100 % (04/04 0453) Weight:  [82.6 kg] 82.6 kg (04/04 0441)  Weight change:  Filed Weights   02/04/20 0500 02/08/20 0441  Weight: 85.3 kg 82.6 kg    Intake/Output: I/O last 3 completed shifts: In: 1881.3 [Other:60; NG/GT:1821.3] Out: 425 [Urine:425]   Intake/Output this shift:  No intake/output data recorded.  Gen: Appears comfortable resting in bed CVS: Pulse  regular rhythm, normal rate, S1 and S2 normal. Tracheostomy site intact dressing. Resp: Clear to auscultation bilaterally, right IJ TDC. Abd: Soft, obese, nontender Ext: Trace-1+lower extremity edema   Basic Metabolic Panel: Recent Labs  Lab 02/04/20 0308 02/04/20 0308 02/05/20 0439 02/05/20 0439 02/06/20 0431 02/07/20 0329 02/08/20 0339  NA 136  --  136  --  135 137 137  K 4.3  --  4.4  --  4.6 3.9 4.3  CL 96*  --  94*  --  94* 97* 97*  CO2 30  --  28  --  _0 GLUCOSE 146*  --  160*  --  138* 143* 136*  BUN 92*  --  119*  --  144* 97* 122*  CREATININE 1.95*  --  2.17*  --  2.44* 1.91* 2.05*  CALCIUM 8.7*   < > 8.7*   < > 8.9 8.8* 8.7*  MG 2.5*  --  2.7*  --  2.6* 2.5* 2.6*  PHOS 5.0*  --  5.7*  --  6.1* 4.4 4.9*   < > = values in this interval not displayed.    Liver Function Tests: Recent Labs  Lab 02/04/20 0308 02/05/20 0439 02/06/20 0431 02/07/20 0329 02/08/20 0339  ALBUMIN 2.6* 2.5* 2.6* 2.6* 2.6*   No results for input(s): LIPASE, AMYLASE in the last 168 hours. No results for input(s): AMMONIA in the last 168 hours.  CBC: Recent Labs  Lab 02/03/20 0355 02/03/20 0355 02/04/20 0308 02/05/20 0439 02/06/20 0431 02/06/20 2004 02/07/20 0329  WBC  5.2  --  5.5 6.3 5.7 5.2  --   HGB 8.4*   < > 8.6* 8.3* 8.3* 8.4* 8.4*  HCT 28.5*   < > 28.9* 28.0* 27.3* 28.2* 28.0*  MCV 97.6  --  97.0 95.9 94.1 94.0  --   PLT 148*  --  141* 143* 139* 128*  --    < > = values in this interval not displayed.    Cardiac Enzymes: No results for input(s): CKTOTAL, CKMB, CKMBINDEX, TROPONINI in the last 168 hours.  BNP: Invalid input(s): POCBNP  CBG: Recent Labs  Lab 02/07/20 1649 02/07/20 2022 02/07/20 2343 02/08/20 0416 02/08/20 0806  GLUCAP 182* 147* 129* 133* 67*    Microbiology: Results for orders placed or performed during the hospital encounter of 01/05/20  Blood culture (routine x 2)     Status: None   Collection Time: 01/05/20  8:04 PM    Specimen: BLOOD  Result Value Ref Range Status   Specimen Description BLOOD PICC LINE  Final   Special Requests   Final    BOTTLES DRAWN AEROBIC AND ANAEROBIC Blood Culture adequate volume   Culture   Final    NO GROWTH 5 DAYS Performed at [facility], [place] [address] [place], [place] [address]    Report Status 01/10/2020 FINAL  Final  Blood culture (routine x 2)     Status: None   Collection Time: 01/05/20  8:04 PM   Specimen: BLOOD RIGHT HAND  Result Value Ref Range Status   Specimen Description BLOOD RIGHT HAND  Final   Special Requests   Final    BOTTLES DRAWN AEROBIC AND ANAEROBIC Blood Culture adequate volume   Culture   Final    NO GROWTH 5 DAYS Performed at [facility], [place] [address], [place], [place] [address]    Report Status 01/10/2020 FINAL  Final  Urine culture     Status: None   Collection Time: 01/05/20  8:04 PM   Specimen: Urine, Random  Result Value Ref Range Status   Specimen Description URINE, RANDOM  Final   Special Requests NONE  Final   Culture   Final    NO GROWTH Performed at [facility], [place] [address], [place], [place] [address]    Report Status 01/07/2020 FINAL  Final  Respiratory Panel by RT PCR (Flu A&B, Covid) - Nasopharyngeal Swab     Status: None   Collection Time: 01/05/20  8:04 PM   Specimen: Nasopharyngeal Swab  Result Value Ref Range Status   SARS Coronavirus 2 by RT PCR NEGATIVE NEGATIVE Final    Comment: (NOTE) SARS-CoV-2 target nucleic acids are NOT DETECTED. The SARS-CoV-2 RNA is generally detectable in upper respiratoy specimens during the acute phase of infection. The lowest concentration of SARS-CoV-2 viral copies this assay can detect is 131 copies/mL. A negative result does not preclude SARS-Cov-2 infection and should not be used as the sole basis for treatment or other patient management decisions. A negative result may occur with  improper specimen collection/handling, submission of specimen other than  nasopharyngeal swab, presence of viral mutation(s) within the areas targeted by this assay, and inadequate number of viral copies (<131 copies/mL). A negative result must be combined with clinical observations, patient history, and epidemiological information. The expected result is Negative. Fact Sheet for Patients:  PinkCheek.be Fact Sheet for Healthcare Providers:  GravelBags.it This test is not yet ap proved or cleared by the [place] FDA and  has been  authorized for detection and/or diagnosis of SARS-CoV-2 by FDA under an Emergency Use Authorization (EUA). This EUA will remain  in effect (meaning this test can be used) for the duration of the COVID-19 declaration under Section 564(b)(1) of the Act, 21 U.S.C. section 360bbb-3(b)(1), unless the authorization is terminated or revoked sooner.    Influenza A by PCR NEGATIVE NEGATIVE Final   Influenza B by PCR NEGATIVE NEGATIVE Final    Comment: (NOTE) The Xpert Xpress SARS-CoV-2/FLU/RSV assay is intended as an aid in  the diagnosis of influenza from Nasopharyngeal swab specimens and  should not be used as a sole basis for treatment. Nasal washings and  aspirates are unacceptable for Xpert Xpress SARS-CoV-2/FLU/RSV  testing. Fact Sheet for Patients: PinkCheek.be Fact Sheet for Healthcare Providers: GravelBags.it This test is not yet approved or cleared by the [place] FDA and  has been authorized for detection and/or diagnosis of SARS-CoV-2 by  FDA under an Emergency Use Authorization (EUA). This EUA will remain  in effect (meaning this test can be used) for the duration of the  Covid-19 declaration under Section 564(b)(1) of the Act, 21  U.S.C. section 360bbb-3(b)(1), unless the authorization is  terminated or revoked. Performed at [facility], [place] [address], [place], [place] [address]   MRSA PCR  Screening     Status: Abnormal   Collection Time: 01/06/20  2:20 AM   Specimen: Nasopharyngeal  Result Value Ref Range Status   MRSA by PCR POSITIVE (A) NEGATIVE Final    Comment:        The GeneXpert MRSA Assay (FDA approved for NASAL specimens only), is one component of a comprehensive MRSA colonization surveillance program. It is not intended to diagnose MRSA infection nor to guide or monitor treatment for MRSA infections. RESULT CALLED TO, READ BACK BY AND VERIFIED WITH: STAPLETON,R RN [phone] MITCHELL,L   Culture, respiratory (non-expectorated)     Status: None   Collection Time: 01/08/20  9:36 AM   Specimen: Tracheal Aspirate; Respiratory  Result Value Ref Range Status   Specimen Description TRACHEAL ASPIRATE  Final   Special Requests NONE  Final   Gram Stain   Final    RARE WBC PRESENT, PREDOMINANTLY PMN RARE GRAM NEGATIVE RODS RARE GRAM POSITIVE COCCI IN PAIRS Performed at [facility], [place] [address], [place], [place] [address]    Culture RARE PSEUDOMONAS AERUGINOSA  Final   Report Status 01/10/2020 FINAL  Final   Organism ID, Bacteria PSEUDOMONAS AERUGINOSA  Final      Susceptibility   Pseudomonas aeruginosa - MIC*    CEFTAZIDIME 2 SENSITIVE Sensitive     CIPROFLOXACIN <=0.25 SENSITIVE Sensitive     GENTAMICIN <=1 SENSITIVE Sensitive     IMIPENEM 2 SENSITIVE Sensitive     PIP/TAZO <=4 SENSITIVE Sensitive     CEFEPIME 2 SENSITIVE Sensitive     * RARE PSEUDOMONAS AERUGINOSA    Coagulation Studies: No results for input(s): LABPROT, INR in the last 72 hours.  Urinalysis: No results for input(s): COLORURINE, LABSPEC, PHURINE, GLUCOSEU, HGBUR, BILIRUBINUR, KETONESUR, PROTEINUR, UROBILINOGEN, NITRITE, LEUKOCYTESUR in the last 72 hours.  Invalid input(s): APPERANCEUR    Imaging: No results found.   Medications:   . sodium chloride    . sodium chloride    . sodium chloride     . B-complex with vitamin C  1 tablet Per Tube Daily  . bisacodyl  10 mg  Rectal Daily  . busPIRone  5 mg Per Tube BID  .  chlorhexidine gluconate (MEDLINE KIT)  15 mL Mouth Rinse BID  . Chlorhexidine Gluconate Cloth  6 each Topical Q0600  . darbepoetin (ARANESP) injection - DIALYSIS  100 mcg Subcutaneous Q Wed-HD  . feeding supplement (NEPRO CARB STEADY)  1,000 mL Oral Q24H  . feeding supplement (PRO-STAT SUGAR FREE 64)  30 mL Per Tube TID  . guaiFENesin  10 mL Per Tube BID  . heparin  5,000 Units Subcutaneous Q8H  . insulin aspart  0-15 Units Subcutaneous Q4H  . pantoprazole sodium  40 mg Per Tube Q24H  . PARoxetine  40 mg Per Tube Daily  . sodium chloride flush  10-40 mL Intracatheter Q12H  . traZODone  25 mg Per Tube QHS   sodium chloride, sodium chloride, alteplase, fentaNYL (SUBLIMAZE) injection, heparin, HYDROcodone-acetaminophen, lidocaine (PF), lidocaine-prilocaine, metoprolol tartrate, ondansetron (ZOFRAN) IV, pentafluoroprop-tetrafluoroeth, sodium chloride, sodium chloride flush  Assessment/ Plan:  1. Acute kidney Injury on chronic kidney disease stage VZ:SMOLMBEML acute kidney injury is from sepsis/ATN. Previously on CRRT beginning 3/7 and later transitioned to intermittent hemodialysis after hemodynamic stability established. She is nonoliguric and with worsening azotemia on labs seen along with slow rise of creatinine. She has refused hemodialysis for the last 3 days her daughter is coming in today to help facilitate discussion/goals of care.   Appreciate assistance from Dr. Darleene Cleaver psychiatry.  Patient is uncooperative with capacity assessment or psychiatric evaluation.  She has continued to refuse dialysis.  Appreciate assistance from palliative medicine and ethics to be consulted 02/09/2020 2. Sepsis: Suspected to be of urinary source but appears to have now resolved status post antibiotic treatment. 3. History of PEA arrest status post tracheostomy; decannulated on 3/19 and appears to be having decent oxygenation since then. 4. Anemia:  Secondary to chronic illness exacerbated by recent hospitalization-adequate iron stores and now on ESA. 5. Disposition: Difficult situation,  refuses dialysis regularly and insists that "she does not want to die".     LOS: [place] _0 _1 :35 AM

## 2020-02-09 DIAGNOSIS — J9601 Acute respiratory failure with hypoxia: Secondary | ICD-10-CM | POA: Diagnosis not present

## 2020-02-09 DIAGNOSIS — N184 Chronic kidney disease, stage 4 (severe): Secondary | ICD-10-CM | POA: Diagnosis not present

## 2020-02-09 DIAGNOSIS — R109 Unspecified abdominal pain: Secondary | ICD-10-CM | POA: Diagnosis not present

## 2020-02-09 DIAGNOSIS — N179 Acute kidney failure, unspecified: Secondary | ICD-10-CM | POA: Diagnosis not present

## 2020-02-09 LAB — CBC
HCT: 28.7 % — ABNORMAL LOW (ref 36.0–46.0)
Hemoglobin: 8.4 g/dL — ABNORMAL LOW (ref 12.0–15.0)
MCH: 27.8 pg (ref 26.0–34.0)
MCHC: 29.3 g/dL — ABNORMAL LOW (ref 30.0–36.0)
MCV: 95 fL (ref 80.0–100.0)
Platelets: 126 10*3/uL — ABNORMAL LOW (ref 150–400)
RBC: 3.02 MIL/uL — ABNORMAL LOW (ref 3.87–5.11)
RDW: 16 % — ABNORMAL HIGH (ref 11.5–15.5)
WBC: 4.9 10*3/uL (ref 4.0–10.5)
nRBC: 0 % (ref 0.0–0.2)

## 2020-02-09 LAB — GLUCOSE, CAPILLARY
Glucose-Capillary: 121 mg/dL — ABNORMAL HIGH (ref 70–99)
Glucose-Capillary: 133 mg/dL — ABNORMAL HIGH (ref 70–99)
Glucose-Capillary: 136 mg/dL — ABNORMAL HIGH (ref 70–99)
Glucose-Capillary: 139 mg/dL — ABNORMAL HIGH (ref 70–99)
Glucose-Capillary: 141 mg/dL — ABNORMAL HIGH (ref 70–99)
Glucose-Capillary: 144 mg/dL — ABNORMAL HIGH (ref 70–99)
Glucose-Capillary: 166 mg/dL — ABNORMAL HIGH (ref 70–99)

## 2020-02-09 LAB — RENAL FUNCTION PANEL
Albumin: 2.7 g/dL — ABNORMAL LOW (ref 3.5–5.0)
Anion gap: 10 (ref 5–15)
BUN: 143 mg/dL — ABNORMAL HIGH (ref 8–23)
CO2: 30 mmol/L (ref 22–32)
Calcium: 8.9 mg/dL (ref 8.9–10.3)
Chloride: 98 mmol/L (ref 98–111)
Creatinine, Ser: 2.37 mg/dL — ABNORMAL HIGH (ref 0.44–1.00)
GFR calc Af Amer: 23 mL/min — ABNORMAL LOW (ref >60)
GFR calc non Af Amer: 20 mL/min — ABNORMAL LOW (ref >60)
Glucose, Bld: 148 mg/dL — ABNORMAL HIGH (ref 70–99)
Phosphorus: 5.7 mg/dL — ABNORMAL HIGH (ref 2.5–4.6)
Potassium: 4.5 mmol/L (ref 3.5–5.1)
Sodium: 138 mmol/L (ref 135–145)

## 2020-02-09 LAB — MAGNESIUM: Magnesium: 2.8 mg/dL — ABNORMAL HIGH (ref 1.7–2.4)

## 2020-02-09 MED ORDER — CALCIUM ACETATE (PHOS BINDER) 667 MG PO CAPS
667.0000 mg | ORAL_CAPSULE | Freq: Three times a day (TID) | ORAL | Status: DC
  Administered 2020-02-10 – 2020-03-21 (×90): 667 mg via ORAL
  Filled 2020-02-09 (×89): qty 1

## 2020-02-09 MED ORDER — AMLODIPINE BESYLATE 5 MG PO TABS
5.0000 mg | ORAL_TABLET | Freq: Every day | ORAL | Status: DC
  Administered 2020-02-09: 14:00:00 5 mg via ORAL
  Filled 2020-02-09: qty 1

## 2020-02-09 NOTE — Progress Notes (Signed)
[facility] NEPHROLOGY PROGRESS NOTE  Assessment/ Plan: Pt is a 74 y.o. yo female with history of prolonged hospitalization at [facility] for PEA arrest, MRSA pneumonia, tracheostomy, AKI on HD transferred to [facility] where she was found to have fluid overload and metabolic acidosis.  Moved to [facility] for further evaluation.  #AKI on CKD stage IV suspected due to sepsis/ATN.  She was previously on CRRT and then later transitioned to intermittent HD when hemodynamically stable.  She has been refusing HD frequently.  Per psychiatry patient does not have capacity to make her own decision.  Now followed by palliative care team and plan for multidisciplinary meeting noted for tomorrow. Patient is nonoliguric.  Noted BUN is elevated.  #Sepsis MRSA pneumonia: Completed antibiotics.  #History of PEA arrest status post tracheostomy: Now on vent dependent  #Anemia of chronic illness: Iron acceptable.  Continue ESA.  #Hypertension: BP elevated.  On metoprolol.  Add amlodipine.    #CKD-MBD: Check PTH level.  Start PhosLo.  Disposition: Difficult situation because patient is refusing dialysis.  Hopefully the multidisciplinary meeting will be able to address this.  Subjective: Seen and examined at bedside.  Denies nausea vomiting chest pain shortness of breath.  Refusing dialysis. Objective Vital signs in last 24 hours: Vitals:   02/08/20 1501 02/08/20 2200 02/09/20 0500 02/09/20 0554  BP: (!) 169/72   (!) 177/72  Pulse: 89 76  83  Resp: _0 Temp: (!) 97.5 F (36.4 C)   97.9 F (36.6 C)  TempSrc: Oral   Oral  SpO2: 100% 98%  100%  Weight:   85.8 kg   Height:       Weight change: 3.2 kg  Intake/Output Summary (Last 24 hours) at 02/09/2020 1337 Last data filed at 02/09/2020 0947 Gross per 24 hour  Intake 1128.67 ml  Output 1250 ml  Net -121.33 ml       Labs: Basic Metabolic Panel: Recent Labs  Lab 02/07/20 0329 02/08/20 0339 02/09/20 0412  NA 137 137 138  K 3.9 4.3  4.5  CL 97* 97* 98  CO2 _1 GLUCOSE 143* 136* 148*  BUN 97* 122* 143*  CREATININE 1.91* 2.05* 2.37*  CALCIUM 8.8* 8.7* 8.9  PHOS 4.4 4.9* 5.7*   Liver Function Tests: Recent Labs  Lab 02/07/20 0329 02/08/20 0339 02/09/20 0412  ALBUMIN 2.6* 2.6* 2.7*   No results for input(s): LIPASE, AMYLASE in the last 168 hours. No results for input(s): AMMONIA in the last 168 hours. CBC: Recent Labs  Lab 02/04/20 0308 02/04/20 0308 02/05/20 0439 02/05/20 0439 02/06/20 0431 02/06/20 0431 02/06/20 2004 02/07/20 0329 02/09/20 0412  WBC 5.5   < > 6.3   < > 5.7  --  5.2  --  4.9  HGB 8.6*   < > 8.3*   < > 8.3*   < > 8.4* 8.4* 8.4*  HCT 28.9*   < > 28.0*   < > 27.3*   < > 28.2* 28.0* 28.7*  MCV 97.0  --  95.9  --  94.1  --  94.0  --  95.0  PLT 141*   < > 143*   < > 139*  --  128*  --  126*   < > = values in this interval not displayed.   Cardiac Enzymes: No results for input(s): CKTOTAL, CKMB, CKMBINDEX, TROPONINI in the last 168 hours. CBG: Recent Labs  Lab 02/08/20 1943 02/09/20 0016 02/09/20 0356 02/09/20 0750 02/09/20 1140  GLUCAP 179* 166* 136* 121* 141*    Iron Studies: No results for input(s): IRON, TIBC, TRANSFERRIN, FERRITIN in the last 72 hours. Studies/Results: No results found.  Medications: Infusions: . sodium chloride    . sodium chloride    . sodium chloride      Scheduled Medications: . B-complex with vitamin C  1 tablet Per Tube Daily  . bisacodyl  10 mg Rectal Daily  . busPIRone  5 mg Per Tube BID  . chlorhexidine gluconate (MEDLINE KIT)  15 mL Mouth Rinse BID  . Chlorhexidine Gluconate Cloth  6 each Topical Q0600  . darbepoetin (ARANESP) injection - DIALYSIS  100 mcg Subcutaneous Q Wed-HD  . feeding supplement (NEPRO CARB STEADY)  1,000 mL Oral Q24H  . feeding supplement (PRO-STAT SUGAR FREE 64)  30 mL Per Tube TID  . guaiFENesin  10 mL Per Tube BID  . heparin  5,000 Units Subcutaneous Q8H  . insulin aspart  0-15 Units Subcutaneous Q4H  .  pantoprazole sodium  40 mg Per Tube Q24H  . PARoxetine  40 mg Per Tube Daily  . sodium chloride flush  10-40 mL Intracatheter Q12H  . traZODone  25 mg Per Tube QHS    have reviewed scheduled and prn medications.  Physical Exam: General:NAD, comfortable, alert awake and oriented to the hospital and her name. Heart:RRR, s1s2 nl Lungs:clear b/l, no crackle Abdomen:soft, Non-tender, non-distended Extremities: Trace LE edema Dialysis Access: Right IJ TDC.  Bailey Cox Bailey Cox 02/09/2020,1:37 PM  LOS: 35 days  Pager: [phone]

## 2020-02-09 NOTE — Progress Notes (Signed)
Patient refused VS x 3 despite explaining the importance of vital signs check.  Only pulse rate and oxygen saturation were recorded from continuous pulse oximetry monitor.

## 2020-02-09 NOTE — TOC Progression Note (Signed)
Transition of Care [facility]) - Progression Note    Patient Details  Name: Bailey Cox MRN: [id number] Date of Birth: Jul 31, 1946  Transition of Care [facility]) CM/SW [place], [place] Phone Number: 02/09/2020, 10:35 AM  Clinical Narrative:    CSW aware that pt refused dialysis again yesterday. CSW called and spoke with pt daughter Katharine Look at [phone]. Discussed that pt again had refused treatment and we were hoping that family could meet with the team as a whole to discuss how to best move forward. Pt daughter states that pt had indicated she wasn't doing therapy or dialysis on Sunday because "it was [place]."   CSW explained that the thing that concerns the team moving forward is that pt will need to be able to tolerate and commit to a consistent treatment schedule. CSW explained that we cannot force pt to go to dialysis and that we are concerned that should pt stabilize and go to a SNF that she would also not be sent to treatment by SNF staff and the outpatient staff at a center cannot dialyze her if she refuses.   Katharine Look and pt granddaughter Autumn are able to meet tomorrow at 31 for conference call. CSW has contacted Dr. Andria Frames with Ethics Committee, Ihor Dow w/ PMT, Terri Piedra w/ nephrology (who will coordinate w/ nephrologist), and Dr. Ree Kida who is attending MD of this meeting. Will request room to meet in from [facility] leadership. CSW will f/u with Katharine Look about how we plan to either video or call conference in tomorrow before meeting.    Expected Discharge Plan: Skilled Nursing Facility Barriers to Discharge: Continued Medical Work up, Other (comment)(ability to tolerate dialysis in recliner)  Expected Discharge Plan and Services Expected Discharge Plan: [place] In-house Referral: Clinical Social Work Discharge Planning Services: CM Consult Post Acute Care Choice: [place] Living arrangements for the past 2 months: [place]    Readmission Risk Interventions Readmission Risk Prevention Plan 01/28/2020  Transportation Screening Complete  PCP or Specialist Appt within 3-5 Days Not Complete  Not Complete comments plan for SNF  HRI or [place] Not Complete  HRI or Home Care Consult comments plan for SNF  Social Work Consult for [place] Planning/Counseling Complete  Palliative Care Screening Complete  Medication Review Press photographer) Referral to Pharmacy  PCP or Specialist appointment within 3-5 days of discharge Not Complete  PCP/Specialist Appt Not Complete comments plan for SNF  HRI or Home Care Consult Complete  SW Recovery Care/Counseling Consult Complete  Palliative Care Screening Not Complete  Comments appropriate at this time  [place] Complete

## 2020-02-09 NOTE — Progress Notes (Signed)
PROGRESS NOTE    Bailey Cox  ZDG:[id number] DOB: Jul 12, 1946 DOA: 01/05/2020 PCP: Patient, No Pcp Per   Brief Narrative:  HPI on 01/05/2020 by Mr. Georgann Housekeeper, NP (ICU) 74 year old female with PMH as below, which is significant for long admission in the [facility] system from 10/2019 all the way through 01/05/2020.  She was originally hospitalized in September 2020 with acute cholecystitis and underwent a cholecystectomy.  She was then sent to SNF for short duration but had return to [facility] to have a bile duct stent placed ultimately returning to SNF.  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  While hospitalized she developed fluid overload and was ultimately transferred to [facility] for dialysis.  Her course after that point become somewhat unclear, but as described by her family she was on and off the ventilator for several procedures and was eventually left intubated.  One of these procedures was an endoscopy during which she unfortunately suffered a cardiac arrest.  Details of the arrest are unclear.  She remained on the ventilator and ultimately underwent tracheostomy.  Her hospital course was also complicated by MRSA pneumonia.  She was ultimately able to come off of hemodialysis and was treated intermittently with diuresis.  It sounds like she bounced in and out of the ICU a few times with volume overload.  On the tail end of her admission she still struggled with kidney disease and was being treated for a urinary tract infection with cefepime and vancomycin.  Plans were to stop on 3/2.  She was discharged from the [facility] system on 3/1 to [facility] in [place].  Upon arrival to [facility] they deemed her too sick for admission and transferred her to [facility] emergency department with complaints of volume overload and acidosis.   Interim history Initially admitted by ICU this patient was ventilator dependent, into trach, which  is now been removed.  Patient started CRRT on 01/13/2020 until 01/16/2020 and transferred to the floor on 01/18/2020 as she was tolerating hemodialysis.  Nephrology has been seeing her and feels that she has AKI on CKD most likely being from ATN/sepsis.  Patient family wants aggressive measures.  CRRT was discontinued on 01/15/2020.  Patient now has [facility] and is scheduled for intermittent dialysis.  Continues to refuse to go to dialysis.  Ethics committee meeting planned for 02/10/2020. 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   -Psychiatry consulted for capacity, patient does not have capacity to make her own decisions -Palliative care also consulted and pending family meeting-continues to be full code -Continues to refuse dialysis -Ethics consulted, planning for meeting on 02/10/2020  MRSA pneumonia -Completed antibiotics  Chronic respiratory failure -Patient did have tracheostomy however was decannulated on 01/23/2020 -Continue supportive measures -Weaned down to 2 L, currently maintaining oxygen saturations in the high 90s to 100%  Diabetes mellitus, type II -Hemoglobin A1c 5.7 -Continue insulin sliding scale CBG monitoring  Essential hypertension -Continue PRN metoprolol  Anemia of chronic kidney disease -Continue Aranesp -Monitor CBC  Hyponatremia -Resolved  Anxiety/depression -Continue Paxil, BuSpar, trazodone  GERD -Continue PPI  Goals of care -Palliative care consulted, patient remains full code -Of note palliative care discussion was had with patient was at [facility]  Previous hospitalist discussed with her daughter, and understands prognosis is  guarded. -Psychiatry consulted for capacity, patient does not have capacity to make her own decisions -Ethics consulted-planning on meeting on  02/10/2020  Nutritional support -She does not want to eat -currently has peg tube and on Tube feeds  DVT Prophylaxis  Heparin  Code Status: Full  Family Communication: None at bedside  Disposition Plan: Admitted from [place]. Now with renal failure needing HD, but continues to refuse. Dispo TBD. LTAC has declined patient. Disposition pending patient's ability to dialyze in a chair. Pending Palliative care and Ethics consultation.  Consultants Nephrology PCCM Psychiatry Palliative care Interventional radiology Ethics  Procedures  Echcoardiogram 01/06/2020 shows an EF of 65 to 82%, grade 2 diastolic dysfunction.  RVSP.  Antibiotics   Anti-infectives (From admission, onward)   Start     Dose/Rate Route Frequency Ordered Stop   01/10/20 0930  vancomycin (VANCOCIN) IVPB 1000 mg/200 mL premix  Status:  Discontinued     1,000 mg 200 mL/hr over 60 Minutes Intravenous  Once 01/10/20 0915 01/13/20 0623   01/06/20 2200  ceFEPIme (MAXIPIME) 2 g in sodium chloride 0.9 % 100 mL IVPB     2 g 200 mL/hr over 30 Minutes Intravenous Every 24 hours 01/05/20 2251 01/11/20 2234   01/05/20 2200  ceFEPIme (MAXIPIME) 2 g in sodium chloride 0.9 % 100 mL IVPB     2 g 200 mL/hr over 30 Minutes Intravenous  Once 01/05/20 2148 01/06/20 0015   01/05/20 2200  metroNIDAZOLE (FLAGYL) IVPB 500 mg     500 mg 100 mL/hr over 60 Minutes Intravenous  Once 01/05/20 2148 01/06/20 0015   01/05/20 2200  vancomycin (VANCOCIN) IVPB 1000 mg/200 mL premix  Status:  Discontinued     1,000 mg 200 mL/hr over 60 Minutes Intravenous  Once 01/05/20 2148 01/05/20 2151   01/05/20 2200  vancomycin (VANCOREADY) IVPB 2000 mg/400 mL  Status:  Discontinued     2,000 mg 200 mL/hr over 120 Minutes Intravenous  Once 01/05/20 2151 01/05/20 2223      Subjective:   Bailey Cox seen and examined today.  No complaints this morning.  Does not feel like speaking with me today.    Objective:   Vitals:   02/08/20 1501 02/08/20 2200  02/09/20 0500 02/09/20 0554  BP: (!) 169/72   (!) 177/72  Pulse: 89 76  83  Resp: '17 18  17  '$ Temp: (!) 97.5 F (36.4 C)   97.9 F (36.6 C)  TempSrc: Oral   Oral  SpO2: 100% 98%  100%  Weight:   85.8 kg   Height:        Intake/Output Summary (Last 24 hours) at 02/09/2020 1055 Last data filed at 02/09/2020 0947 Gross per 24 hour  Intake 1128.67 ml  Output 1850 ml  Net -721.33 ml   Filed Weights   02/08/20 0441 02/09/20 0500  Weight: 82.6 kg 85.8 kg   Exam  General: Well developed, chronically ill-appearing, NAD  HEENT: NCAT, mucous membranes moist.   Cardiovascular: S1 S2 auscultated, RRR  Respiratory: Clear to auscultation bilaterally  Abdomen: Soft, nontender, nondistended, + bowel sounds, + peg  Extremities: warm dry without cyanosis clubbing. LE edema B/L   Data Reviewed: I have personally reviewed following labs and imaging studies  CBC: Recent Labs  Lab 02/04/20 0308 02/04/20 0308 02/05/20 0439 02/06/20 0431 02/06/20 2004 02/07/20 0329 02/09/20 0412  WBC 5.5  --  6.3 5.7 5.2  --  4.9  HGB 8.6*   < > 8.3* 8.3* 8.4* 8.4* 8.4*  HCT 28.9*   < > 28.0* 27.3* 28.2* 28.0* 28.7*  MCV 97.0  --  95.9 94.1 94.0  --  95.0  PLT 141*  --  143* 139* 128*  --  126*   < > = values in this interval not displayed.   Basic Metabolic Panel: Recent Labs  Lab 02/05/20 0439 02/06/20 0431 02/07/20 0329 02/08/20 0339 02/09/20 0412  NA 136 135 137 137 138  K 4.4 4.6 3.9 4.3 4.5  CL 94* 94* 97* 97* 98  CO2 '28 29 30 28 30  '$ GLUCOSE 160* 138* 143* 136* 148*  BUN 119* 144* 97* 122* 143*  CREATININE 2.17* 2.44* 1.91* 2.05* 2.37*  CALCIUM 8.7* 8.9 8.8* 8.7* 8.9  MG 2.7* 2.6* 2.5* 2.6* 2.8*  PHOS 5.7* 6.1* 4.4 4.9* 5.7*   GFR: Estimated Creatinine Clearance: 23.1 mL/min (A) (by C-G formula based on SCr of 2.37 mg/dL (H)). Liver Function Tests: Recent Labs  Lab 02/05/20 0439 02/06/20 0431 02/07/20 0329 02/08/20 0339 02/09/20 0412  ALBUMIN 2.5* 2.6* 2.6* 2.6* 2.7*    No results for input(s): LIPASE, AMYLASE in the last 168 hours. No results for input(s): AMMONIA in the last 168 hours. Coagulation Profile: No results for input(s): INR, PROTIME in the last 168 hours. Cardiac Enzymes: No results for input(s): CKTOTAL, CKMB, CKMBINDEX, TROPONINI in the last 168 hours. BNP (last 3 results) No results for input(s): PROBNP in the last 8760 hours. HbA1C: No results for input(s): HGBA1C in the last 72 hours. CBG: Recent Labs  Lab 02/08/20 1636 02/08/20 1943 02/09/20 0016 02/09/20 0356 02/09/20 0750  GLUCAP 113* 179* 166* 136* 121*   Lipid Profile: No results for input(s): CHOL, HDL, LDLCALC, TRIG, CHOLHDL, LDLDIRECT in the last 72 hours. Thyroid Function Tests: No results for input(s): TSH, T4TOTAL, FREET4, T3FREE, THYROIDAB in the last 72 hours. Anemia Panel: No results for input(s): VITAMINB12, FOLATE, FERRITIN, TIBC, IRON, RETICCTPCT in the last 72 hours. Urine analysis:    Component Value Date/Time   COLORURINE AMBER (A) 01/13/2020 1302   APPEARANCEUR TURBID (A) 01/13/2020 1302   LABSPEC 1.018 01/13/2020 1302   PHURINE 5.0 01/13/2020 1302   GLUCOSEU NEGATIVE 01/13/2020 1302   HGBUR SMALL (A) 01/13/2020 1302   BILIRUBINUR NEGATIVE 01/13/2020 1302   KETONESUR NEGATIVE 01/13/2020 1302   PROTEINUR >=300 (A) 01/13/2020 1302   NITRITE NEGATIVE 01/13/2020 1302   LEUKOCYTESUR LARGE (A) 01/13/2020 1302   Sepsis Labs: '@LABRCNTIP'$ (procalcitonin:4,lacticidven:4)  )No results found for this or any previous visit (from the past 240 hour(s)).    Radiology Studies: No results found.   Scheduled Meds: . B-complex with vitamin C  1 tablet Per Tube Daily  . bisacodyl  10 mg Rectal Daily  . busPIRone  5 mg Per Tube BID  . chlorhexidine gluconate (MEDLINE KIT)  15 mL Mouth Rinse BID  . Chlorhexidine Gluconate Cloth  6 each Topical Q0600  . darbepoetin (ARANESP) injection - DIALYSIS  100 mcg Subcutaneous Q Wed-HD  . feeding supplement (NEPRO  CARB STEADY)  1,000 mL Oral Q24H  . feeding supplement (PRO-STAT SUGAR FREE 64)  30 mL Per Tube TID  . guaiFENesin  10 mL Per Tube BID  . heparin  5,000 Units Subcutaneous Q8H  . insulin aspart  0-15 Units Subcutaneous Q4H  . pantoprazole sodium  40 mg Per Tube Q24H  . PARoxetine  40 mg Per Tube Daily  . sodium chloride flush  10-40 mL Intracatheter Q12H  . traZODone  25 mg Per Tube QHS   Continuous  Infusions: . sodium chloride    . sodium chloride    . sodium chloride       LOS: 35 days   Time Spent in minutes   30 minutes  Izack Hoogland D.O. on 02/09/2020 at 10:55 AM  Between 7am to 7pm - Please see pager noted on [email or web address]  After 7pm go to [email or web address]  And look for the night coverage person covering for me after hours  [facility] Office  [phone]

## 2020-02-10 DIAGNOSIS — Z7189 Other specified counseling: Secondary | ICD-10-CM | POA: Diagnosis not present

## 2020-02-10 DIAGNOSIS — R109 Unspecified abdominal pain: Secondary | ICD-10-CM | POA: Diagnosis not present

## 2020-02-10 DIAGNOSIS — F339 Major depressive disorder, recurrent, unspecified: Secondary | ICD-10-CM | POA: Diagnosis not present

## 2020-02-10 DIAGNOSIS — Z515 Encounter for palliative care: Secondary | ICD-10-CM | POA: Diagnosis not present

## 2020-02-10 DIAGNOSIS — N184 Chronic kidney disease, stage 4 (severe): Secondary | ICD-10-CM | POA: Diagnosis not present

## 2020-02-10 DIAGNOSIS — J9601 Acute respiratory failure with hypoxia: Secondary | ICD-10-CM | POA: Diagnosis not present

## 2020-02-10 DIAGNOSIS — N179 Acute kidney failure, unspecified: Secondary | ICD-10-CM | POA: Diagnosis not present

## 2020-02-10 LAB — RENAL FUNCTION PANEL
Albumin: 2.6 g/dL — ABNORMAL LOW (ref 3.5–5.0)
Anion gap: 11 (ref 5–15)
BUN: 165 mg/dL — ABNORMAL HIGH (ref 8–23)
CO2: 29 mmol/L (ref 22–32)
Calcium: 8.8 mg/dL — ABNORMAL LOW (ref 8.9–10.3)
Chloride: 94 mmol/L — ABNORMAL LOW (ref 98–111)
Creatinine, Ser: 2.4 mg/dL — ABNORMAL HIGH (ref 0.44–1.00)
GFR calc Af Amer: 23 mL/min — ABNORMAL LOW (ref >60)
GFR calc non Af Amer: 20 mL/min — ABNORMAL LOW (ref >60)
Glucose, Bld: 133 mg/dL — ABNORMAL HIGH (ref 70–99)
Phosphorus: 5.6 mg/dL — ABNORMAL HIGH (ref 2.5–4.6)
Potassium: 4.8 mmol/L (ref 3.5–5.1)
Sodium: 134 mmol/L — ABNORMAL LOW (ref 135–145)

## 2020-02-10 LAB — GLUCOSE, CAPILLARY
Glucose-Capillary: 131 mg/dL — ABNORMAL HIGH (ref 70–99)
Glucose-Capillary: 133 mg/dL — ABNORMAL HIGH (ref 70–99)
Glucose-Capillary: 133 mg/dL — ABNORMAL HIGH (ref 70–99)
Glucose-Capillary: 134 mg/dL — ABNORMAL HIGH (ref 70–99)
Glucose-Capillary: 134 mg/dL — ABNORMAL HIGH (ref 70–99)
Glucose-Capillary: 158 mg/dL — ABNORMAL HIGH (ref 70–99)

## 2020-02-10 LAB — MAGNESIUM: Magnesium: 2.7 mg/dL — ABNORMAL HIGH (ref 1.7–2.4)

## 2020-02-10 MED ORDER — CHLORHEXIDINE GLUCONATE CLOTH 2 % EX PADS
6.0000 | MEDICATED_PAD | Freq: Every day | CUTANEOUS | Status: DC
  Administered 2020-02-13 – 2020-03-05 (×18): 6 via TOPICAL

## 2020-02-10 MED ORDER — DIPHENHYDRAMINE HCL 25 MG PO CAPS
25.0000 mg | ORAL_CAPSULE | Freq: Four times a day (QID) | ORAL | Status: DC | PRN
  Administered 2020-02-10 – 2020-02-13 (×3): 25 mg via ORAL
  Filled 2020-02-10 (×3): qty 1

## 2020-02-10 MED ORDER — AMLODIPINE BESYLATE 10 MG PO TABS
10.0000 mg | ORAL_TABLET | Freq: Every day | ORAL | Status: DC
  Administered 2020-02-10 – 2020-02-16 (×7): 10 mg via ORAL
  Filled 2020-02-10 (×7): qty 1

## 2020-02-10 NOTE — TOC Progression Note (Signed)
Transition of Care [facility]) - Progression Note    Patient Details  Name: Bailey Cox MRN: [id number] Date of Birth: 01-10-1946  Transition of Care [facility]) CM/SW [place], [place] Phone Number: 02/10/2020, 10:22 AM  Clinical Narrative:    CSW spoke w/ pt daughter Bailey Cox, provided her with the information to call into the conference call. She and pt granddughter Bailey Cox will join at 74 am. Provided Bailey Cox with information about who will be on the call, and that per nephrology's notes that pt refused dialysis again this morning.   Ethics meeting scheduled for 11:00am, will meet in 6N conference room.   Expected Discharge Plan: Skilled Nursing Facility Barriers to Discharge: Continued Medical Work up, Other (comment)(ability to tolerate dialysis in recliner)  Expected Discharge Plan and Services Expected Discharge Plan: [place] In-house Referral: Clinical Social Work Discharge Planning Services: CM Consult Post Acute Care Choice: [place] Living arrangements for the past 2 months: (Has been hospitalized for over 4 months)   Readmission Risk Interventions Readmission Risk Prevention Plan 01/28/2020  Transportation Screening Complete  PCP or Specialist Appt within 3-5 Days Not Complete  Not Complete comments plan for SNF  HRI or [place] Not Complete  HRI or Home Care Consult comments plan for SNF  Social Work Consult for [place] Planning/Counseling Complete  Palliative Care Screening Complete  Medication Review Press photographer) Referral to Pharmacy  PCP or Specialist appointment within 3-5 days of discharge Not Complete  PCP/Specialist Appt Not Complete comments plan for SNF  HRI or Home Care Consult Complete  SW Recovery Care/Counseling Consult Complete  Palliative Care Screening Not Complete  Comments appropriate at this time  [place] Complete
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
## 2020-02-10 NOTE — Progress Notes (Addendum)
PROGRESS NOTE    Bailey Cox  WER:[id number] DOB: [birth year] DOA: 01/05/2020 PCP: Patient, No Pcp Per   Brief Narrative:  HPI on 01/05/2020 by Mr. Georgann Housekeeper, NP (ICU) 74 year old female with PMH as below, which is significant for long admission in the [facility] system from 10/2019 all the way through 01/05/2020.  She was originally hospitalized in September 2020 with acute cholecystitis and underwent a cholecystectomy.  She was then sent to SNF for short duration but had return to [facility] to have a bile duct stent placed ultimately returning to SNF.  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  While hospitalized she developed fluid overload and was ultimately transferred to [facility] for dialysis.  Her course after that point become somewhat unclear, but as described by her family she was on and off the ventilator for several procedures and was eventually left intubated.  One of these procedures was an endoscopy during which she unfortunately suffered a cardiac arrest.  Details of the arrest are unclear.  She remained on the ventilator and ultimately underwent tracheostomy.  Her hospital course was also complicated by MRSA pneumonia.  She was ultimately able to come off of hemodialysis and was treated intermittently with diuresis.  It sounds like she bounced in and out of the ICU a few times with volume overload.  On the tail end of her admission she still struggled with kidney disease and was being treated for a urinary tract infection with cefepime and vancomycin.  Plans were to stop on 3/2.  She was discharged from the [facility] system on 3/1 to [facility] in [place].  Upon arrival to [facility] they deemed her too sick for admission and transferred her to [facility] emergency department with complaints of volume overload and acidosis.   Interim history Initially admitted by ICU this patient was ventilator dependent, into trach, which  is now been removed.  Patient started CRRT on 01/13/2020 until 01/16/2020 and transferred to the floor on 01/18/2020 as she was tolerating hemodialysis.  Nephrology has been seeing her and feels that she has AKI on CKD most likely being from ATN/sepsis.  Patient family wants aggressive measures.  CRRT was discontinued on 01/15/2020.  Patient now has [facility] and is scheduled for intermittent dialysis.  Continues to refuse to go to dialysis.  Ethics committee meeting planned for 02/10/2020. 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   -Psychiatry consulted for capacity, patient does not have capacity to make her own decisions -Palliative care also consulted and pending family meeting-continues to be full code -Continues to refuse dialysis -Ethics consulted, planning for meeting on 02/10/2020  MRSA pneumonia -Completed antibiotics  Chronic respiratory failure -Patient did have tracheostomy however was decannulated on 01/23/2020 -Continue supportive measures -Weaned down to 2 L, currently maintaining oxygen saturations in the high 90s to 100%  Diabetes mellitus, type II -Hemoglobin A1c 5.7 -Continue insulin sliding scale CBG monitoring  Essential hypertension -Continue PRN metoprolol  Anemia of chronic kidney disease -Continue Aranesp -Monitor CBC  Hyponatremia -Resolved  Anxiety/depression -Continue Paxil, BuSpar, trazodone  GERD -Continue PPI  Goals of care -Palliative care consulted, patient remains full code -Of note palliative care discussion was had with patient was at [facility].  Previous hospitalist discussed with her daughter, and understands prognosis is  guarded. -Psychiatry consulted for capacity, patient does not have capacity to make her own decisions -Ethics consulted-planning on meeting on  02/10/2020  Nutritional support -She does not want to eat -currently has peg tube and on Tube feeds  DVT Prophylaxis  Heparin  Code Status: Full  Family Communication: None at bedside  Disposition Plan: Admitted from [place]. Now with renal failure needing HD, but continues to refuse. Dispo TBD. LTAC has declined patient. Disposition pending patient's ability to dialyze in a chair. Pending Palliative care and Ethics meeting today.  Addendum: After Ethics meeting I reached out to [facility] for possible transfer as this closer to the patient's family and it was requested. [facility] declined transfer.   Consultants Nephrology PCCM Psychiatry Palliative care Interventional radiology Ethics  Procedures  Echcoardiogram 01/06/2020 shows an EF of 65 to 45%, grade 2 diastolic dysfunction.  RVSP.  Antibiotics   Anti-infectives (From admission, onward)   Start     Dose/Rate Route Frequency Ordered Stop   01/10/20 0930  vancomycin (VANCOCIN) IVPB 1000 mg/200 mL premix  Status:  Discontinued     1,000 mg 200 mL/hr over 60 Minutes Intravenous  Once 01/10/20 0915 01/13/20 0623   01/06/20 2200  ceFEPIme (MAXIPIME) 2 g in sodium chloride 0.9 % 100 mL IVPB     2 g 200 mL/hr over 30 Minutes Intravenous Every 24 hours 01/05/20 2251 01/11/20 2234   01/05/20 2200  ceFEPIme (MAXIPIME) 2 g in sodium chloride 0.9 % 100 mL IVPB     2 g 200 mL/hr over 30 Minutes Intravenous  Once 01/05/20 2148 01/06/20 0015   01/05/20 2200  metroNIDAZOLE (FLAGYL) IVPB 500 mg     500 mg 100 mL/hr over 60 Minutes Intravenous  Once 01/05/20 2148 01/06/20 0015   01/05/20 2200  vancomycin (VANCOCIN) IVPB 1000 mg/200 mL premix  Status:  Discontinued     1,000 mg 200 mL/hr over 60 Minutes Intravenous  Once 01/05/20 2148 01/05/20 2151   01/05/20 2200  vancomycin (VANCOREADY) IVPB 2000 mg/400 mL  Status:  Discontinued     2,000 mg 200 mL/hr over 120 Minutes Intravenous  Once 01/05/20 2151 01/05/20 2223       Subjective:   Bailey Cox seen and examined today.  No complaints this morning.  Denies current chest pain or shortness of breath, abdominal pain, nausea or vomiting, diarrhea or constipation, dizziness or headache. Objective:   Vitals:   02/09/20 0554 02/09/20 1351 02/09/20 2022 02/10/20 0438  BP: (!) 177/72 (!) 181/72 (!) 176/70 (!) 162/65  Pulse: 83 82 77 76  Resp: '17 17 19 18  '$ Temp: 97.9 F (36.6 C) 98 F (36.7 C) 97.6 F (36.4 C) 98 F (36.7 C)  TempSrc: Oral Oral Oral Oral  SpO2: 100% 96% 96% 95%  Weight:      Height:        Intake/Output Summary (Last 24 hours) at 02/10/2020 1032 Last data filed at 02/09/2020 1556 Gross per 24 hour  Intake 357.33 ml  Output 150 ml  Net 207.33 ml   Filed Weights   02/08/20 0441 02/09/20 0500  Weight: 82.6 kg 85.8 kg   Exam  General: Well developed, chronically ill-appearing, NAD  HEENT: NCAT, mucous membranes moist.   Cardiovascular: S1 S2 auscultated, RRR  Respiratory: Clear to auscultation bilaterally   Abdomen: Soft, nontender, nondistended, + bowel sounds, +peg  Extremities: warm dry without cyanosis clubbing. LE edema /L  Neuro: AAOx3, nonfocal  Psych: Appropriate mood and affect  Data Reviewed: I  have personally reviewed following labs and imaging studies  CBC: Recent Labs  Lab 02/04/20 0308 02/04/20 0308 02/05/20 0439 02/06/20 0431 02/06/20 2004 02/07/20 0329 02/09/20 0412  WBC 5.5  --  6.3 5.7 5.2  --  4.9  HGB 8.6*   < > 8.3* 8.3* 8.4* 8.4* 8.4*  HCT 28.9*   < > 28.0* 27.3* 28.2* 28.0* 28.7*  MCV 97.0  --  95.9 94.1 94.0  --  95.0  PLT 141*  --  143* 139* 128*  --  126*   < > = values in this interval not displayed.   Basic Metabolic Panel: Recent Labs  Lab 02/06/20 0431 02/07/20 0329 02/08/20 0339 02/09/20 0412 02/10/20 0416  NA 135 137 137 138 134*  K 4.6 3.9 4.3 4.5 4.8  CL 94* 97* 97* 98 94*  CO2 '29 30 28 30 29  '$ GLUCOSE 138* 143* 136* 148* 133*  BUN 144* 97* 122* 143* 165*   CREATININE 2.44* 1.91* 2.05* 2.37* 2.40*  CALCIUM 8.9 8.8* 8.7* 8.9 8.8*  MG 2.6* 2.5* 2.6* 2.8* 2.7*  PHOS 6.1* 4.4 4.9* 5.7* 5.6*   GFR: Estimated Creatinine Clearance: 22.8 mL/min (A) (by C-G formula based on SCr of 2.4 mg/dL (H)). Liver Function Tests: Recent Labs  Lab 02/06/20 0431 02/07/20 0329 02/08/20 0339 02/09/20 0412 02/10/20 0416  ALBUMIN 2.6* 2.6* 2.6* 2.7* 2.6*   No results for input(s): LIPASE, AMYLASE in the last 168 hours. No results for input(s): AMMONIA in the last 168 hours. Coagulation Profile: No results for input(s): INR, PROTIME in the last 168 hours. Cardiac Enzymes: No results for input(s): CKTOTAL, CKMB, CKMBINDEX, TROPONINI in the last 168 hours. BNP (last 3 results) No results for input(s): PROBNP in the last 8760 hours. HbA1C: No results for input(s): HGBA1C in the last 72 hours. CBG: Recent Labs  Lab 02/09/20 1636 02/09/20 2007 02/10/20 0029 02/10/20 0431 02/10/20 0728  GLUCAP 133* 139* 158* 134* 131*   Lipid Profile: No results for input(s): CHOL, HDL, LDLCALC, TRIG, CHOLHDL, LDLDIRECT in the last 72 hours. Thyroid Function Tests: No results for input(s): TSH, T4TOTAL, FREET4, T3FREE, THYROIDAB in the last 72 hours. Anemia Panel: No results for input(s): VITAMINB12, FOLATE, FERRITIN, TIBC, IRON, RETICCTPCT in the last 72 hours. Urine analysis:    Component Value Date/Time   COLORURINE AMBER (A) 01/13/2020 1302   APPEARANCEUR TURBID (A) 01/13/2020 1302   LABSPEC 1.018 01/13/2020 1302   PHURINE 5.0 01/13/2020 1302   GLUCOSEU NEGATIVE 01/13/2020 1302   HGBUR SMALL (A) 01/13/2020 1302   BILIRUBINUR NEGATIVE 01/13/2020 1302   KETONESUR NEGATIVE 01/13/2020 1302   PROTEINUR >=300 (A) 01/13/2020 1302   NITRITE NEGATIVE 01/13/2020 1302   LEUKOCYTESUR LARGE (A) 01/13/2020 1302   Sepsis Labs: '@LABRCNTIP'$ (procalcitonin:4,lacticidven:4)  )No results found for this or any previous visit (from the past 240 hour(s)).    Radiology  Studies: No results found.   Scheduled Meds: . amLODipine  10 mg Oral Daily  . B-complex with vitamin C  1 tablet Per Tube Daily  . bisacodyl  10 mg Rectal Daily  . busPIRone  5 mg Per Tube BID  . calcium acetate  667 mg Oral TID WC  . chlorhexidine gluconate (MEDLINE KIT)  15 mL Mouth Rinse BID  . Chlorhexidine Gluconate Cloth  6 each Topical Q0600  . darbepoetin (ARANESP) injection - DIALYSIS  100 mcg Subcutaneous Q Wed-HD  . feeding supplement (NEPRO CARB STEADY)  1,000 mL Oral Q24H  . feeding supplement (PRO-STAT SUGAR FREE 64)  30 mL Per Tube TID  . guaiFENesin  10 mL Per Tube BID  . heparin  5,000 Units Subcutaneous Q8H  . insulin aspart  0-15 Units Subcutaneous Q4H  . pantoprazole sodium  40 mg Per Tube Q24H  . PARoxetine  40 mg Per Tube Daily  . sodium chloride flush  10-40 mL Intracatheter Q12H  . traZODone  25 mg Per Tube QHS   Continuous Infusions: . sodium chloride    . sodium chloride    . sodium chloride       LOS: 36 days   Time Spent in minutes   30 minutes  Corley Kohls D.O. on 02/10/2020 at 10:32 AM  Between 7am to 7pm - Please see pager noted on [email or web address]  After 7pm go to [email or web address]  And look for the night coverage person covering for me after hours  [facility] Office  [phone]

## 2020-02-10 NOTE — TOC Progression Note (Addendum)
Transition of Care [facility]) - Progression Note    Patient Details  Name: Bailey Cox MRN: [id number] Date of Birth: Oct 30, 1946  Transition of Care [facility]) CM/SW [place], [place] Phone Number: 02/10/2020, 12:07 PM  Clinical Narrative:    4:36pm- CSW spoke w/ Dr. Ree Kida, request for transfer to [facility] was denied.   12:07pm- CSW was joined for family meeting by [place] member Dr. Andria Frames, Gregary Signs and Leroy, NPs with PMT, and Jaclyn Shaggy, [place] renal navigator. Pt granddaughter Autumn and daughter Katharine Look [name]) present on conference call as well. We discussed a review of pt course between multiple SNFs and rehabs.   Pt has had a complicated course, they understand that currently the barrier to transitioning to SNF or home w/ dialysis is her agreeing to consistently go to treatment and being able to tolerate treatment. They feel as though that if pt was closer to them and they were able to visit and encourage pt that she would complete treatment. The closest hospital to them is [facility] that does dialysis. Dr. Andria Frames gave brief overview that transfer is not certain and that there are barriers to tx. CSW will discuss this with medical director in LOS meeting tomorrow.   CSW aware that at this time pt family is continuing to desire pt to receive dialysis with the goal of pt improving and returning to [place] area. They request that nursing team assist with calling them if pt refuses dialysis/allowing them to speak w/ pt in the morning. CSW will communicate this to nursing leadership, the team in the meeting did let family know again that we cannot force treatment.   TOC team remains available as disposition planning continues.    Expected Discharge Plan: Skilled Nursing Facility Barriers to Discharge: Continued Medical Work up, Other (comment)(ability to tolerate dialysis in recliner)  Expected Discharge Plan and Services Expected Discharge Plan: [place] In-house Referral: Clinical Social Work Discharge Planning Services: CM Consult Post Acute Care Choice: [place] Living arrangements for the past 2 months: (Has been hospitalized for over 4 months)                   Readmission Risk Interventions Readmission Risk Prevention Plan 01/28/2020  Transportation Screening Complete  PCP or Specialist Appt within 3-5 Days Not Complete  Not Complete comments plan for SNF  HRI or [place] Not Complete  HRI or Home Care Consult comments plan for SNF  Social Work Consult for [place] Planning/Counseling Complete  Palliative Care Screening Complete  Medication Review Press photographer) Referral to Pharmacy  PCP or Specialist appointment within 3-5 days of discharge Not Complete  PCP/Specialist Appt Not Complete comments plan for SNF  HRI or Home Care Consult Complete  SW Recovery Care/Counseling Consult Complete  Palliative Care Screening Not Complete  Comments appropriate at this time  [place] Complete

## 2020-02-10 NOTE — Progress Notes (Signed)
Patient ate 3 bites of icy with phoslo medication this morning. Refused to finished icy. States she does not want to eat anything now.

## 2020-02-10 NOTE — Progress Notes (Signed)
Nutrition Follow-up  DOCUMENTATION CODES:   Morbid obesity  INTERVENTION:   ContinueNepro@ 46m/hr via PEG  325mProstat TID.   Tube feeding regimen provides2028kcal (100% of needs),123grams of protein, and 69873mf H2O.  NUTRITION DIAGNOSIS:   Inadequate oral intake related to inability to eat as evidenced by NPO status.  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 CRRTstarted 3/11 CRRT stopped 3/17- trach changed (#6 cuffless to #4 cuffless) 3/18- trach capped 3/19- decannulated  Reviewed I/O's: +327 ml x 24 hours and -4.3 L since 3.23.21  UOP: 150 ml x 24 hours  Per SLP notes, pt cleared for sips of liquids, however, noted meal completion 0%. She remains dependent on TF: Nepro @ 40 ml/hr with 30 ml Prostat TID; regimen provides 2028 kcals, 123 grams protein, and 698 ml water daily, meeting 100% of estimated kcal and protein needs.   Pt continues to refuse HD. Per psychiatry notes, pt does not have decision making capacity. Family wishes to continue HD at this time. Plan multi-disciplinary care meeting with family and ethics committee today.   Labs reviewed: Na: 134, Phos: 5.6, Mg: 2.7, CBGS: 131-158 (inpatient orders for glycemic control are 0-15 units insulin aspart every 4 hours).   Diet Order:   Diet Order            Diet NPO time specified Except  for: Ice Chips, Other (See Comments)  Diet effective now              EDUCATION NEEDS:   No education needs have been identified at this time  Skin:  Skin Assessment: Reviewed RN Assessment(L groin: open wound/MASD)  Last BM:  02/09/20  Height:   Ht Readings from Last 1 Encounters:  01/05/20 '5\' 4"'$  (1.626 m)    Weight:   Wt Readings from Last 1 Encounters:  02/09/20 85.8 kg    Ideal Body Weight:  54.5 kg  BMI:  Body mass index is 32.47 kg/m.  Estimated Nutritional Needs:   Kcal:  1700-1900  Protein:  110-136 grams  Fluid:  >1.5 L/day    JenLoistine ChanceD, LDN, CDCAlpinegistered Dietitian II Certified Diabetes Care and Education Specialist Please refer to [facility] and/or RD on-call/weekend/after hours pager

## 2020-02-10 NOTE — Progress Notes (Addendum)
Daily Progress Note   Patient Name: Bailey Cox       Date: 02/10/2020 DOB: Feb 22, 1946  Age: 74 y.o. MRN#: [id number] Attending Physician: Cristal Ford, DO Primary Care Physician: Patient, No Pcp Per Admit Date: 01/05/2020  Reason for Consultation/Follow-up: Establishing goals of care  Subjective: Patient awake, alert, oriented to person/place. Flat affect. When asked if she will go to dialysis today she states "I don't know." Emphasized need to stay compliant with dialysis or she will die (per daughter, need to be blunt with patient) to which patient tells me she is not ready to die. When I mentioned hospice, she shouts "no!"    Only took few bites of [place] ice for RN this morning.   GOC:   Participated in ethics consultation with Dr. Andria Frames, SW [place], [place] Patient's daughter and granddaughter Katharine Look and Autumn) were on conference call. They are documented HCPOA's. Still waiting on daughter to fax POA paperwork to 6N. Per daughter, this was faxed last week but not received. I requested again over the weekend.    Past medical history, medical decline since September, and course of hospital diagnoses, interventions, and plan of care discussed in detail by Dr. Andria Frames. Please see detailed note.  The patient has continued to speak to family (and this NP last week) that she is not ready to die and does not want hospice services although continues to refuse dialysis and various medical interventions when family is not at bedside. Family lives in [place] and only able to visit on weekends. Katharine Look and Autumn strongly believe she would do better if able to be surrounded by family. (This NP did visit with patient and daughter on Friday and Saturday, the days Elyanah agreed to  dialysis, sat in recliner, and worked with physical therapy).   Family is eager to see if transfer to [facility] is an option, the closest hospital that offers dialysis near them. SW will evaluate.  Supportive family. Daughter mentions there are other family members who do not work and could assist with transportation to and from a dialysis clinic if/when we can get her closer to home.   Greatly appreciate this collaborative meeting with Dr. Andria Frames. Ongoing discussions. PMT will follow.   Length of Stay: 36  Current Medications: Scheduled Meds:  . amLODipine  10 mg Oral Daily  .  B-complex with vitamin C  1 tablet Per Tube Daily  . bisacodyl  10 mg Rectal Daily  . busPIRone  5 mg Per Tube BID  . calcium acetate  667 mg Oral TID WC  . chlorhexidine gluconate (MEDLINE KIT)  15 mL Mouth Rinse BID  . Chlorhexidine Gluconate Cloth  6 each Topical Q0600  . darbepoetin (ARANESP) injection - DIALYSIS  100 mcg Subcutaneous Q Wed-HD  . feeding supplement (NEPRO CARB STEADY)  1,000 mL Oral Q24H  . feeding supplement (PRO-STAT SUGAR FREE 64)  30 mL Per Tube TID  . guaiFENesin  10 mL Per Tube BID  . heparin  5,000 Units Subcutaneous Q8H  . insulin aspart  0-15 Units Subcutaneous Q4H  . pantoprazole sodium  40 mg Per Tube Q24H  . PARoxetine  40 mg Per Tube Daily  . sodium chloride flush  10-40 mL Intracatheter Q12H  . traZODone  25 mg Per Tube QHS    Continuous Infusions: . sodium chloride    . sodium chloride    . sodium chloride      PRN Meds: sodium chloride, sodium chloride, alteplase, diphenhydrAMINE, fentaNYL (SUBLIMAZE) injection, heparin, HYDROcodone-acetaminophen, lidocaine (PF), lidocaine-prilocaine, metoprolol tartrate, ondansetron (ZOFRAN) IV, pentafluoroprop-tetrafluoroeth, sodium chloride, sodium chloride flush  Physical Exam Vitals and nursing note reviewed.  Constitutional:      Appearance: She is ill-appearing.  HENT:     Head: Normocephalic and atraumatic.   Pulmonary:     Effort: No tachypnea, accessory muscle usage or respiratory distress.  Skin:    General: Skin is warm and dry.     Coloration: Skin is pale.  Neurological:     Mental Status: She is easily aroused.     Comments: Alert. Oriented to person/place.   Psychiatric:        Mood and Affect: Affect is flat.            Vital Signs: BP (!) 162/65 (BP Location: Left Leg)   Pulse 76   Temp 98 F (36.7 C) (Oral)   Resp 18   Ht 5' 4" (1.626 m)   Wt 85.8 kg   SpO2 95%   BMI 32.47 kg/m  SpO2: SpO2: 95 % O2 Device: O2 Device: Room Air O2 Flow Rate: O2 Flow Rate (L/min): 2 L/min  Intake/output summary:   Intake/Output Summary (Last 24 hours) at 02/10/2020 1202 Last data filed at 02/09/2020 1750 Gross per 24 hour  Intake 757.33 ml  Output 150 ml  Net 607.33 ml   LBM: Last BM Date: 02/09/20 Baseline Weight: Weight: 113 kg Most recent weight: Weight: 85.8 kg       Palliative Assessment/Data: PPS 40%    Flowsheet Rows     Most Recent Value  Intake Tab  Referral Department  Hospitalist  Unit at Time of Referral  Med/Surg Unit  Palliative Care Primary Diagnosis  Nephrology  Date Notified  01/29/20  Palliative Care Type  New Palliative care  Reason for referral  Clarify Goals of Care  Date of Admission  01/05/20  Date first seen by Palliative Care  02/02/20  # of days Palliative referral response time  4 Day(s)  # of days IP prior to Palliative referral  24  Clinical Assessment  Psychosocial & Spiritual Assessment  Palliative Care Outcomes      Patient Active Problem List   Diagnosis Date Noted  . Palliative care by specialist   . Weakness   . Depression, recurrent ([place])   . Abdominal pain   .  Goals of care, counseling/discussion   . Acute hypoxemic respiratory failure ([place])   . Acute kidney injury ([place])   . Anemia due to stage 4 chronic kidney disease ([place])   . Hypotension   . Sepsis ([place])   . Renal failure 01/06/2020  . Tracheostomy status ([place])  01/06/2020  . Chronic hypoxemic respiratory failure ([place]) 01/06/2020  . Shock ([place]) 01/05/2020    Palliative Care Assessment & Plan   Patient Profile: 74 y.o. female  with past medical history of cholecystectomy with CBD stenting in September followed by PEA arrest during endoscopy that lead to hospitalization from December 2020 until January 05, 2020 at [facility]). 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 She was discharged to [facility] on 3/1 who immediately sent her to [facility].  On admission 01/05/2020 she was found to have acidosis with acute on chronic kidney failure.  Unfortunately she has not had renal recovery.  Assessment: AKI on CKD stage IV MRSA pneumonia Chronic respiratory, now decannulated DM type II Essential hypertension Anemia of CKD Anxiety Depression  Recommendations/Plan:  Continue full code/full scope treatment following ethics consultation. Please review Dr. Lowella Bandy note 02/10/20.  SW following. Family hopeful to get her closer to home [facility]) and [facility] is the closest hospital that will perform dialysis.   Continue PT/OT/SLP  Recommend attempting iPAD video chats with family when they are unavailable to visit [facility] during the week.   Manage pain/nausea during dialysis. PRN's available.   MOST form completed with patient/family current wishes for FULL code/FULL scope treatment.   Did not receive copy of patient's advance directive. Requested again. Daughter will fax to 6N.  Ongoing palliative discussions.  Goals of Care and Additional Recommendations:  Limitations on Scope of Treatment: Full Scope Treatment  Code Status: FULL   Code Status Orders  (From admission, onward)         Start     Ordered   01/05/20 2350  Full code  Continuous     01/05/20 2352        Code Status History    This patient has a current code status but no  historical code status.   Advance Care Planning Activity       Prognosis:  Poor prognosis  Discharge Planning:   To Be Determined  Care plan was discussed with RN, patient, participated in ethics consultation with Dr. Andria Frames, Oval Linsey, [place] SW, and daughter/ granddaughter   Thank you for allowing the Palliative Medicine Team to assist in the care of this patient.   Time In: 1100 Time Out: 1200 Total Time 60 Prolonged Time Billed no      Greater than 50%  of this time was spent counseling and coordinating care related to the above assessment and plan.  Ihor Dow, DNP, FNP-C Palliative Medicine Team  Phone: [phone] Fax: [phone]  Please contact Palliative Medicine Team phone at [phone] for questions and concerns.

## 2020-02-10 NOTE — Progress Notes (Signed)
[facility] NEPHROLOGY PROGRESS NOTE  Assessment/ Plan: Pt is a 74 y.o. yo female with history of prolonged hospitalization at [facility] for PEA arrest, MRSA pneumonia, tracheostomy, AKI on HD transferred to [facility] where she was found to have fluid overload and metabolic acidosis.  Moved to [facility] for further evaluation.  #AKI on CKD stage IV suspected due to sepsis/ATN.  She was previously on CRRT and then later transitioned to intermittent HD when hemodynamically stable.  She has been refusing HD frequently.  Per psychiatry patient does not have capacity to make her own decision.  Now followed by palliative care team and plan for multidisciplinary meeting noted for today. Noted BUN is elevated and creatinine is mildly trending up.  Potassium level acceptable.  Plan to do dialysis when patient agrees or decide after today's multidisciplinary meeting.  Last HD was on 4/2 for 2 and half hour.  #Sepsis MRSA pneumonia: Completed antibiotics.  #History of PEA arrest with chronic respiratory failure: The tracheostomy was decannulated.  Now on oxygen.  #Anemia of chronic illness: Iron acceptable.  Continue ESA.  #Hypertension: BP elevated.  On metoprolol.  Increase amlodipine to 10 mg.  Partly related with volume as well.      #CKD-MBD: Check PTH level.  Started PhosLo.  Disposition: Difficult situation because patient is refusing dialysis.  Hopefully the multidisciplinary meeting will be able to address this.  Subjective: Seen and examined at bedside.  Lying on bed comfortable.  He still refusing dialysis.  Denies nausea vomiting chest pain shortness of breath. Objective Vital signs in last 24 hours: Vitals:   02/09/20 0554 02/09/20 1351 02/09/20 2022 02/10/20 0438  BP: (!) 177/72 (!) 181/72 (!) 176/70 (!) 162/65  Pulse: 83 82 77 76  Resp: '17 17 19 18  '$ Temp: 97.9 F (36.6 C) 98 F (36.7 C) 97.6 F (36.4 C) 98 F (36.7 C)  TempSrc: Oral Oral Oral Oral  SpO2: 100% 96% 96% 95%   Weight:      Height:       Weight change:   Intake/Output Summary (Last 24 hours) at 02/10/2020 0908 Last data filed at 02/09/2020 1556 Gross per 24 hour  Intake 477.33 ml  Output 150 ml  Net 327.33 ml       Labs: Basic Metabolic Panel: Recent Labs  Lab 02/08/20 0339 02/09/20 0412 02/10/20 0416  NA 137 138 134*  K 4.3 4.5 4.8  CL 97* 98 94*  CO2 '28 30 29  '$ GLUCOSE 136* 148* 133*  BUN 122* 143* 165*  CREATININE 2.05* 2.37* 2.40*  CALCIUM 8.7* 8.9 8.8*  PHOS 4.9* 5.7* 5.6*   Liver Function Tests: Recent Labs  Lab 02/08/20 0339 02/09/20 0412 02/10/20 0416  ALBUMIN 2.6* 2.7* 2.6*   No results for input(s): LIPASE, AMYLASE in the last 168 hours. No results for input(s): AMMONIA in the last 168 hours. CBC: Recent Labs  Lab 02/04/20 0308 02/04/20 0308 02/05/20 0439 02/05/20 0439 02/06/20 0431 02/06/20 0431 02/06/20 2004 02/07/20 0329 02/09/20 0412  WBC 5.5   < > 6.3   < > 5.7  --  5.2  --  4.9  HGB 8.6*   < > 8.3*   < > 8.3*   < > 8.4* 8.4* 8.4*  HCT 28.9*   < > 28.0*   < > 27.3*   < > 28.2* 28.0* 28.7*  MCV 97.0  --  95.9  --  94.1  --  94.0  --  95.0  PLT 141*   < >  143*   < > 139*  --  128*  --  126*   < > = values in this interval not displayed.   Cardiac Enzymes: No results for input(s): CKTOTAL, CKMB, CKMBINDEX, TROPONINI in the last 168 hours. CBG: Recent Labs  Lab 02/09/20 1636 02/09/20 2007 02/10/20 0029 02/10/20 0431 02/10/20 0728  GLUCAP 133* 139* 158* 134* 131*    Iron Studies: No results for input(s): IRON, TIBC, TRANSFERRIN, FERRITIN in the last 72 hours. Studies/Results: No results found.  Medications: Infusions: . sodium chloride    . sodium chloride    . sodium chloride      Scheduled Medications: . amLODipine  5 mg Oral Daily  . B-complex with vitamin C  1 tablet Per Tube Daily  . bisacodyl  10 mg Rectal Daily  . busPIRone  5 mg Per Tube BID  . calcium acetate  667 mg Oral TID WC  . chlorhexidine gluconate (MEDLINE  KIT)  15 mL Mouth Rinse BID  . Chlorhexidine Gluconate Cloth  6 each Topical Q0600  . darbepoetin (ARANESP) injection - DIALYSIS  100 mcg Subcutaneous Q Wed-HD  . feeding supplement (NEPRO CARB STEADY)  1,000 mL Oral Q24H  . feeding supplement (PRO-STAT SUGAR FREE 64)  30 mL Per Tube TID  . guaiFENesin  10 mL Per Tube BID  . heparin  5,000 Units Subcutaneous Q8H  . insulin aspart  0-15 Units Subcutaneous Q4H  . pantoprazole sodium  40 mg Per Tube Q24H  . PARoxetine  40 mg Per Tube Daily  . sodium chloride flush  10-40 mL Intracatheter Q12H  . traZODone  25 mg Per Tube QHS    have reviewed scheduled and prn medications.  Physical Exam: General: Not in distress, lying in bed comfortable. Heart:RRR, s1s2 nl, no rubs Lungs:clear b/l, no crackle Abdomen:soft, Non-tender, non-distended Extremities: Trace LE edema Dialysis Access: Right IJ TDC.  [place] Cork [name] 02/10/2020,9:08 AM  LOS: 36 days  Pager: [phone]

## 2020-02-10 NOTE — Progress Notes (Signed)
Attempted to feed patient jello/icy. Patient refused but drank 400 ml of water for the day.

## 2020-02-10 NOTE — Progress Notes (Signed)
PT Cancellation Note  Patient Details Name: Bailey Cox MRN: [id number] DOB: [birth year]   Cancelled Treatment:    Reason Eval/Treat Not Completed: Patient declined, no reason specified. Upon arrival, pt shaking her head no before therapist even introduced herself. Pt refusing therapy this date. Pt reporting her insides hurt and she cant do anything. Pt adamantly refusing despite education on importance of therapy and encouragement. PT will follow up at later date/time.  Zachary George PT, DPT 3:53 PM,02/10/20    Shuan Statzer Drucilla Chalet 02/10/2020, 3:52 PM

## 2020-02-11 DIAGNOSIS — N179 Acute kidney failure, unspecified: Secondary | ICD-10-CM | POA: Diagnosis not present

## 2020-02-11 LAB — MAGNESIUM: Magnesium: 2.7 mg/dL — ABNORMAL HIGH (ref 1.7–2.4)

## 2020-02-11 LAB — CBC
HCT: 27 % — ABNORMAL LOW (ref 36.0–46.0)
Hemoglobin: 8.1 g/dL — ABNORMAL LOW (ref 12.0–15.0)
MCH: 28.4 pg (ref 26.0–34.0)
MCHC: 30 g/dL (ref 30.0–36.0)
MCV: 94.7 fL (ref 80.0–100.0)
Platelets: 127 10*3/uL — ABNORMAL LOW (ref 150–400)
RBC: 2.85 MIL/uL — ABNORMAL LOW (ref 3.87–5.11)
RDW: 15.5 % (ref 11.5–15.5)
WBC: 5.3 10*3/uL (ref 4.0–10.5)
nRBC: 0 % (ref 0.0–0.2)

## 2020-02-11 LAB — RENAL FUNCTION PANEL
Albumin: 2.6 g/dL — ABNORMAL LOW (ref 3.5–5.0)
Anion gap: 10 (ref 5–15)
BUN: 175 mg/dL — ABNORMAL HIGH (ref 8–23)
CO2: 29 mmol/L (ref 22–32)
Calcium: 8.9 mg/dL (ref 8.9–10.3)
Chloride: 93 mmol/L — ABNORMAL LOW (ref 98–111)
Creatinine, Ser: 2.45 mg/dL — ABNORMAL HIGH (ref 0.44–1.00)
GFR calc Af Amer: 22 mL/min — ABNORMAL LOW (ref >60)
GFR calc non Af Amer: 19 mL/min — ABNORMAL LOW (ref >60)
Glucose, Bld: 129 mg/dL — ABNORMAL HIGH (ref 70–99)
Phosphorus: 6.2 mg/dL — ABNORMAL HIGH (ref 2.5–4.6)
Potassium: 4.6 mmol/L (ref 3.5–5.1)
Sodium: 132 mmol/L — ABNORMAL LOW (ref 135–145)

## 2020-02-11 LAB — GLUCOSE, CAPILLARY
Glucose-Capillary: 114 mg/dL — ABNORMAL HIGH (ref 70–99)
Glucose-Capillary: 115 mg/dL — ABNORMAL HIGH (ref 70–99)
Glucose-Capillary: 124 mg/dL — ABNORMAL HIGH (ref 70–99)
Glucose-Capillary: 128 mg/dL — ABNORMAL HIGH (ref 70–99)
Glucose-Capillary: 142 mg/dL — ABNORMAL HIGH (ref 70–99)
Glucose-Capillary: 143 mg/dL — ABNORMAL HIGH (ref 70–99)
Glucose-Capillary: 156 mg/dL — ABNORMAL HIGH (ref 70–99)

## 2020-02-11 LAB — PARATHYROID HORMONE, INTACT (NO CA): PTH: 59 pg/mL (ref 15–65)

## 2020-02-11 MED ORDER — CAMPHOR-MENTHOL 0.5-0.5 % EX LOTN
TOPICAL_LOTION | Freq: Four times a day (QID) | CUTANEOUS | Status: DC
  Administered 2020-02-11 – 2020-03-10 (×7): 1 via TOPICAL
  Filled 2020-02-11 (×2): qty 222

## 2020-02-11 NOTE — Progress Notes (Signed)
Patient refused dialysis treatment today.  Orders received to attempt dialysis again tomorrow per Dr. Carolin Sicks.

## 2020-02-11 NOTE — Progress Notes (Signed)
Renal Navigator sees that patient has continued to refuse interventions, such as HD and working with PT. Navigator has been speaking with CSW/I. Chasse and PMT/M. Mason to brainstorm about this difficult case.  Family had asked during Ethics meeting yesterday to be called before patient goes to HD so that they can encourage her-Navigator explained that it is not known when a patient will be called for treatment on any given day. Family had asked that they be called if patient refused HD treatment, but this too is difficult as many people are involved in her care. For example, today Navigator requested from HD unit to be called if patient refuses HD so that family could be notified, however, patient refused when Dr. Carolin Sicks did his morning Rounds, and Navigator was not aware of this until much later in the day. PMT, CSW and Navigator agree that since we cannot move patient closer to family, we must request that family be more physically present with patient if at all possible. CSW spoke with Unit Director regarding visitation restrictions/exceptions.  Renal Navigator called patient's daughter/Sandy to inform that patient has been declined for transfer to [facility] and explained why. She was understanding though sounded disappointed. Navigator explained that though the hospital staff want to help her and her niece Autumn be more involved with her mother by telephone, it has proven to be very difficult. [name] agreed that Autumn attempted to call this morning to encourage her grandmother to go to HD and patient hung up on her. Navigator explained to [place] that it appears that HD is the only thing holding patient in the hospital at this point because Navigator is tasked with getting her accepted to an OP HD clinic and will not be able to do so until patient shows willingness to participate in treatment and to tolerate in a recliner at least once. Navigator suggests 5-6 consecutive treatments and then feels  Navigator can be successful in making a referral. [name] was understanding. Navigator added that patient is not working with PT and [name] stated that she was not aware of this.  Renal Navigator informed [name] that Unit Director has approved for 1 additional visitor and asked that she, Autumn and one other support person talk about how they can make a plan to be here with patient as much as possible over the next two weeks to encourage patient to attend HD treatments to show consistency in order for Navigator to be able to make a referral to OP HD. Navigator feels this will be the only way for family to see that they will be successful in getting patient to go to treatment or see if she continues to refuse even if they are here, physically, with her over a continual period of time. At first [place] said she could be here 1 day a week, potentially. Again, Navigator stated understanding that they work and are a great distance, also acknowledging that this request is not to make anyone feel guilty for not being here, but sees this as the only way to see if we can get patient home to them. [name] reports that she and Autumn will have a discussion with the rest of the family this weekend and that her thoughts are to try to come two days per week if she will be able to stay overnight in between and will ask if Autumn can stay over on the weekends, if she too could stay overnight one night in between the two day visit. Navigator is unsure about  overnight visits, but informed [name] that this request will be passed along to leadership via unit CSW and that Navigator will advocate for this. [name] was extremely appreciative of the conversation and states that she will call Navigator back tomorrow after speaking with her family.  Renal Navigator will continue to follow.  Alphonzo Cruise, [place] Renal Navigator [phone]

## 2020-02-11 NOTE — Progress Notes (Signed)
  Speech Language Pathology Treatment: Dysphagia  Patient Details Name: Bailey Cox MRN: [id number] DOB: Oct 28, 1946 Today's Date: 02/11/2020 Time: 8295-6213 SLP Time Calculation (min) (ACUTE ONLY): 15 min  Assessment / Plan / Recommendation Clinical Impression  Pt more accepting of POs this session with Mod encouragement and cues. Pt accepted ~1/2 package of [place] ice and a few sips of water, then stated she was finished and did not want any more. This is an improvement considering in previous sessions, she would only accept a few bites and refuse. She was offered puree to attempt to upgrade consistencies, but declined despite encouragement. No s/sx of aspiration were noted while offering POs, however once reclined back in bed following the session she was observed with x3 coughing. Staff should continue to offer the pt POs throughout the day (sign above bed with instructions), ensuring she is sitting fully upright during and after offering POs (>30 mins). SLP will continue to follow.    HPI HPI: 74 y.o. female who had a prolonged 76 day hospitalization at [facility] with a history of HTN COPD DM hypothyroidism Ogilvie's syndrome. PEA arrest during EGD in early December with duodenal ulcer noted on EGD. Intermittent hyperkalemia treatet medically w/ Kayexalate and now recently w/ worsening renal function over the past few days prior to admission to [facility].  during admission at [facility] pt weaned off the vent and eventually decannulated. According to her daughter, she has not had anything to eat or drink since November due to primary problem Olglivie's Syndrome, but had no difficulty with swallowing prior.      SLP Plan  Continue with current plan of care       Recommendations  Diet recommendations: Thin liquid;Other(comment)(bites of jello/[place] ice via spoon) Liquids provided via: Teaspoon;Cup;Straw Medication Administration: Via alternative means Supervision: Staff to assist with self  feeding;Full supervision/cueing for compensatory strategies;Trained caregiver to feed patient Compensations: Slow rate;Small sips/bites Postural Changes and/or Swallow Maneuvers: Seated upright 90 degrees                Plan: Continue with current plan of care       [place], Student SLP Office: [phone]  02/11/2020, 10:45 AM

## 2020-02-11 NOTE — Progress Notes (Signed)
PT Cancellation Note  Patient Details Name: Adaysha Dubinsky MRN: [id number] DOB: 1946/07/18   Cancelled Treatment:    Reason Eval/Treat Not Completed: Patient declined, no reason specified patient received in bed, opens eyes briefly and states "no!" then closed them again. Shakes head or flat out states "no!" to all PT attempts to mobilize today, eventually yelling "get out and leave me alone!", but happily states "OK" when PT said goodbye. Will potentially plan to sign off if she refuses therapy tomorrow as it will be 3rd refusal in a row.    Windell Norfolk, DPT, PN1   Supplemental Physical Therapist [facility]    Pager [phone] Acute Rehab Office [phone]

## 2020-02-11 NOTE — Progress Notes (Signed)
[facility] NEPHROLOGY PROGRESS NOTE  Assessment/ Plan: Pt is a 74 y.o. yo female with history of prolonged hospitalization at [facility] for PEA arrest, MRSA pneumonia, tracheostomy, AKI on HD transferred to [facility] where she was found to have fluid overload and metabolic acidosis.  Moved to [facility] for further evaluation.  #AKI on CKD stage IV suspected due to sepsis/ATN.  She was previously on CRRT and then later transitioned to intermittent HD when hemodynamically stable.  She has been refusing HD frequently.  Per psychiatry patient does not have capacity to make her own decision.   Patient was seen by palliative care consult and had a psych consult on 4/6. She is alert awake and oriented to her birthday and hospital.  She refused dialysis again today.  Social worker following. Noted BUN is elevated. Potassium level acceptable.  Plan to do dialysis when patient agrees.  Last HD was on 4/2 for 2 and half hour.  #Sepsis MRSA pneumonia: Completed antibiotics.  #History of PEA arrest with chronic respiratory failure: The tracheostomy was decannulated.  Now on oxygen.  #Anemia of chronic illness: Iron acceptable.  Continue ESA.  #Hypertension: BP elevated.  On metoprolol.  Increase amlodipine to 10 mg.  Partly related with volume as well.      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  Started PhosLo.  # Disposition: Difficult situation because patient is refusing dialysis.    Subjective: Seen and examined at bedside.  Patient is lying on bed comfortable, reports doing well.  Denies nausea vomiting chest pain shortness of breath.  Urine output 300 cc. Objective Vital signs in last 24 hours: Vitals:   02/10/20 2044 02/11/20 0326 02/11/20 0500 02/11/20 1313  BP: (!) 148/77 (!) 169/62  (!) 167/72  Pulse: 77 72  76  Resp: _0 Temp: (!) 97.5 F (36.4 C) 97.6 F (36.4 C)  98.4 F (36.9 C)  TempSrc: Oral Oral  Oral  SpO2: 98% 100%  99%  Weight:   86.9 kg   Height:       Weight change:    Intake/Output Summary (Last 24 hours) at 02/11/2020 1350 Last data filed at 02/11/2020 1200 Gross per 24 hour  Intake 664 ml  Output 300 ml  Net 364 ml       Labs: Basic Metabolic Panel: Recent Labs  Lab 02/09/20 0412 02/10/20 0416 02/11/20 0453  NA 138 134* 132*  K 4.5 4.8 4.6  CL 98 94* 93*  CO2 _1 GLUCOSE 148* 133* 129*  BUN 143* 165* 175*  CREATININE 2.37* 2.40* 2.45*  CALCIUM 8.9 8.8* 8.9  PHOS 5.7* 5.6* 6.2*   Liver Function Tests: Recent Labs  Lab 02/09/20 0412 02/10/20 0416 02/11/20 0453  ALBUMIN 2.7* 2.6* 2.6*   No results for input(s): LIPASE, AMYLASE in the last 168 hours. No results for input(s): AMMONIA in the last 168 hours. CBC: Recent Labs  Lab 02/05/20 0439 02/05/20 0439 02/06/20 0431 02/06/20 0431 02/06/20 2004 02/06/20 2004 02/07/20 0329 02/09/20 0412 02/11/20 0454  WBC 6.3   < > 5.7   < > 5.2  --   --  4.9 5.3  HGB 8.3*   < > 8.3*   < > 8.4*   < > 8.4* 8.4* 8.1*  HCT 28.0*   < > 27.3*   < > 28.2*   < > 28.0* 28.7* 27.0*  MCV 95.9  --  94.1  --  94.0  --   --  95.0 94.7  PLT 143*   < > 139*   < > 128*  --   --  126* 127*   < > = values in this interval not displayed.   Cardiac Enzymes: No results for input(s): CKTOTAL, CKMB, CKMBINDEX, TROPONINI in the last 168 hours. CBG: Recent Labs  Lab 02/10/20 2046 02/11/20 0003 02/11/20 0327 02/11/20 0750 02/11/20 1153  GLUCAP 133* 142* 124* 115* 156*    Iron Studies: No results for input(s): IRON, TIBC, TRANSFERRIN, FERRITIN in the last 72 hours. Studies/Results: No results found.  Medications: Infusions: . sodium chloride    . sodium chloride    . sodium chloride      Scheduled Medications: . amLODipine  10 mg Oral Daily  . B-complex with vitamin C  1 tablet Per Tube Daily  . bisacodyl  10 mg Rectal Daily  . busPIRone  5 mg Per Tube BID  . calcium acetate  667 mg Oral TID WC  . camphor-menthol   Topical QID  . chlorhexidine gluconate (MEDLINE KIT)  15 mL Mouth  Rinse BID  . Chlorhexidine Gluconate Cloth  6 each Topical Q0600  . Chlorhexidine Gluconate Cloth  6 each Topical Q0600  . darbepoetin (ARANESP) injection - DIALYSIS  100 mcg Subcutaneous Q Wed-HD  . feeding supplement (NEPRO CARB STEADY)  1,000 mL Oral Q24H  . feeding supplement (PRO-STAT SUGAR FREE 64)  30 mL Per Tube TID  . guaiFENesin  10 mL Per Tube BID  . heparin  5,000 Units Subcutaneous Q8H  . insulin aspart  0-15 Units Subcutaneous Q4H  . pantoprazole sodium  40 mg Per Tube Q24H  . PARoxetine  40 mg Per Tube Daily  . sodium chloride flush  10-40 mL Intracatheter Q12H  . traZODone  25 mg Per Tube QHS    have reviewed scheduled and prn medications.  Physical Exam: General: Not in distress, comfortable Heart:RRR, s1s2 nl, no rubs Lungs:clear b/l, no crackle Abdomen:soft, Non-tender, non-distended Extremities: Trace LE edema Dialysis Access: Right IJ TDC.  Iker Nuttall Prasad Temika Sutphin 02/11/2020,1:50 PM  LOS: 37 days  Pager: [phone]

## 2020-02-11 NOTE — Progress Notes (Signed)
Renal Navigator attended Ethics meeting with family members/daughter [name] and granddaughter Autumn (via conference call), as well as CSW/I. Chasse, PMT NPs Hedwig Morton and Gregary Signs. Meeting was facilitated by Dr. Andria Frames of the Ethics Committee. well. We began with a review of pt's course between multiple SNFs and rehabs over the past 7 months. Renal Navigator met with Dr. Naval architect, who was unable to attend in person, but was available by phone at any point needed at the meeting. Navigator reported off, per his report, that patient will remain dialysis dependent moving forward and that she refused dialysis again this morning when he spoke with her. Support offered to family by team and all are in agreement that this is a difficult situation, and that we are at a place of great dilemma where patient states that she does not want to die, but that her actions (refusing life sustaining treatment-dialysis) do not match her words. Patient's family was left to ponder at what point would they feel that her actions are a better expression of her true wishes (see detailed note by Dr. Andria Frames). Navigator pointed out how much progress patient has made that has been stated in the meeting (decannulated, tolerating HD), but also stated concern that, as Renal Navigator is tasked with finding an outpatient center to accept patient, the difficulty in this will be ongoing compliance 3 times per week, since she is showing [place] non-compliance already in the hospital. Navigator informed family that she will need to be taken by family 3 times per week, and that if the disposition plan is SNF, a SNF cannot force her to go to treatment. Navigator was told by daughter that there is "lots of family who are available" make sure patient gets to HD 3 times per week. Patient's daughter is not concerned that this will be an issue once she gets home. Navigator notes that patient still needs to have HD in a recliner before discharge readiness.   Family continues to want full scope and full code and thinks that patient will go to HD if encouraged. Navigator informed them that hospital staff has not been successful in getting patient to go and that it appears only family is successful in doing so. They state they need assistance in getting in touch with patient in the hospital and would like to be notified when she refuses HD. CSW and Navigator will work on this aspect of the plan. Patient's family would like her moved closer to them so that they could visit more often. They state that the closest hospital with HD is [facility] in [place]. A transfer to [place] will be evaluated. If patient is not transferred/discharged from [facility], Navigator cannot foresee being able to get patient accepted to an OP HD clinic with notes that say she is continuing to refuse HD. She will need a period of time where she has been compliant with HD treatment with accompanying documentation, including her ability to tolerate HD treatment here in a recliner prior to discharge. Renal Navigator will continue to follow closely.  Alphonzo Cruise, [place] Renal Navigator [phone]

## 2020-02-11 NOTE — Progress Notes (Signed)
PROGRESS NOTE    Bailey Cox  JXB:[id number] DOB: February 07, 1946 DOA: 01/05/2020 PCP: Patient, No Pcp Per   Brief Narrative:  HPI on 01/05/2020 by Mr. Georgann Housekeeper, NP (ICU) 74 year old female with PMH as below, which is significant for long admission in the [facility] system from 10/2019 all the way through 01/05/2020.  She was originally hospitalized in September 2020 with acute cholecystitis and underwent a cholecystectomy.  She was then sent to SNF for short duration but had return to [facility] to have a bile duct stent placed ultimately returning to SNF.  She then returned to 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  While hospitalized she developed fluid overload and was ultimately transferred to [facility] for dialysis.  Her course after that point become somewhat unclear, but as described by her family she was on and off the ventilator for several procedures and was eventually left intubated.  One of these procedures was an endoscopy during which she unfortunately suffered a cardiac arrest.  Details of the arrest are unclear.  She remained on the ventilator and ultimately underwent tracheostomy.  Her hospital course was also complicated by MRSA pneumonia.  She was ultimately able to come off of hemodialysis and was treated intermittently with diuresis.  It sounds like she bounced in and out of the ICU a few times with volume overload.  On the tail end of her admission she still struggled with kidney disease and was being treated for a urinary tract infection with cefepime and vancomycin.  Plans were to stop on 3/2.  She was discharged from the [facility] system on 3/1 to [facility] in [place].  Upon arrival to [facility] they deemed her too sick for admission and transferred her to [facility] emergency department with complaints of volume overload and acidosis.   Interim history Initially admitted by ICU this patient was ventilator dependent, into trach, which  is now been removed.  Patient started CRRT on 01/13/2020 until 01/16/2020 and transferred to the floor on 01/18/2020 as she was tolerating hemodialysis.  Nephrology has been seeing her and feels that she has AKI on CKD most likely being from ATN/sepsis.  Patient family wants aggressive measures.  CRRT was discontinued on 01/15/2020.  Patient now has [facility] and is scheduled for intermittent dialysis.  Continues to refuse to go to dialysis.  Ethics committee meeting 02/10/2020. 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   -Psychiatry consulted for capacity, patient does not have capacity to make her own decisions -Palliative care also consulted and pending family meeting-continues to be full code -Continues to refuse dialysis -Ethics consulted -Family plans to stay around to facilitate hemodialysis.  MRSA pneumonia -Completed antibiotics  Chronic respiratory failure -Patient did have tracheostomy however was decannulated on 01/23/2020 -Continue supportive measures -Weaned down to 2 L, currently maintaining oxygen saturations in the high 90s to 100%  Diabetes mellitus, type II -Hemoglobin A1c 5.7 -Continue insulin sliding scale CBG monitoring  Essential hypertension -Continue PRN metoprolol  Anemia of chronic kidney disease -Continue Aranesp -Monitor CBC  Hyponatremia -Resolved  Anxiety/depression -Continue Paxil, BuSpar, trazodone  GERD -Continue PPI  Goals of care -Palliative care consulted, patient remains full code -Of note palliative care discussion was had with patient was at [facility].  Previous hospitalist discussed with her daughter, and understands prognosis  is guarded. -Psychiatry consulted for capacity, patient does not have capacity to make her own decisions -Ethics consulted  Nutritional  support -She does not want to eat -currently has peg tube and on Tube feeds  DVT Prophylaxis  Heparin  Code Status: Full  Family Communication: None at bedside  Disposition Plan: Admitted from [place]. Now with renal failure needing HD, but continues to refuse. Dispo TBD. LTAC has declined patient. Disposition pending patient's ability to dialyze in a chair.  [facility] has declined transfer.   Consultants Nephrology PCCM Psychiatry Palliative care Interventional radiology Ethics  Procedures  Echcoardiogram 01/06/2020 shows an EF of 65 to 28%, grade 2 diastolic dysfunction.  RVSP.  Antibiotics   Anti-infectives (From admission, onward)   Start     Dose/Rate Route Frequency Ordered Stop   01/10/20 0930  vancomycin (VANCOCIN) IVPB 1000 mg/200 mL premix  Status:  Discontinued     1,000 mg 200 mL/hr over 60 Minutes Intravenous  Once 01/10/20 0915 01/13/20 0623   01/06/20 2200  ceFEPIme (MAXIPIME) 2 g in sodium chloride 0.9 % 100 mL IVPB     2 g 200 mL/hr over 30 Minutes Intravenous Every 24 hours 01/05/20 2251 01/11/20 2234   01/05/20 2200  ceFEPIme (MAXIPIME) 2 g in sodium chloride 0.9 % 100 mL IVPB     2 g 200 mL/hr over 30 Minutes Intravenous  Once 01/05/20 2148 01/06/20 0015   01/05/20 2200  metroNIDAZOLE (FLAGYL) IVPB 500 mg     500 mg 100 mL/hr over 60 Minutes Intravenous  Once 01/05/20 2148 01/06/20 0015   01/05/20 2200  vancomycin (VANCOCIN) IVPB 1000 mg/200 mL premix  Status:  Discontinued     1,000 mg 200 mL/hr over 60 Minutes Intravenous  Once 01/05/20 2148 01/05/20 2151   01/05/20 2200  vancomycin (VANCOREADY) IVPB 2000 mg/400 mL  Status:  Discontinued     2,000 mg 200 mL/hr over 120 Minutes Intravenous  Once 01/05/20 2151 01/05/20 2223      Subjective:   Bailey Cox seen and examined today.  Reports itching as well as nausea.  No no other acute events.  No fever no chills.  Objective:   Vitals:   02/10/20 2044 02/11/20 0326 02/11/20 0500 02/11/20  1313  BP: (!) 148/77 (!) 169/62  (!) 167/72  Pulse: 77 72  76  Resp: '18 18  18  '$ Temp: (!) 97.5 F (36.4 C) 97.6 F (36.4 C)  98.4 F (36.9 C)  TempSrc: Oral Oral  Oral  SpO2: 98% 100%  99%  Weight:   86.9 kg   Height:        Intake/Output Summary (Last 24 hours) at 02/11/2020 1909 Last data filed at 02/11/2020 1515 Gross per 24 hour  Intake 444 ml  Output 800 ml  Net -356 ml   Filed Weights   02/08/20 0441 02/09/20 0500 02/11/20 0500  Weight: 82.6 kg 85.8 kg 86.9 kg   Exam  General: Well developed, chronically ill-appearing, NAD  HEENT: NCAT, mucous membranes moist.   Cardiovascular: S1 S2 auscultated, RRR  Respiratory: Clear to auscultation bilaterally   Abdomen: Soft, nontender, nondistended, + bowel sounds, +peg  Extremities: warm dry without cyanosis clubbing. LE edema /L  Neuro: AAOx3, nonfocal  Psych: Appropriate mood and affect negative asterixis  Data Reviewed: I have personally reviewed following labs and imaging studies  CBC: Recent Labs  Lab 02/05/20 0439 02/05/20 0439 02/06/20 0431 02/06/20 2004 02/07/20 0329 02/09/20 0412 02/11/20 0454  WBC 6.3  --  5.7  5.2  --  4.9 5.3  HGB 8.3*   < > 8.3* 8.4* 8.4* 8.4* 8.1*  HCT 28.0*   < > 27.3* 28.2* 28.0* 28.7* 27.0*  MCV 95.9  --  94.1 94.0  --  95.0 94.7  PLT 143*  --  139* 128*  --  126* 127*   < > = values in this interval not displayed.   Basic Metabolic Panel: Recent Labs  Lab 02/07/20 0329 02/08/20 0339 02/09/20 0412 02/10/20 0416 02/11/20 0453 02/11/20 0454  NA 137 137 138 134* 132*  --   K 3.9 4.3 4.5 4.8 4.6  --   CL 97* 97* 98 94* 93*  --   CO2 '30 28 30 29 29  '$ --   GLUCOSE 143* 136* 148* 133* 129*  --   BUN 97* 122* 143* 165* 175*  --   CREATININE 1.91* 2.05* 2.37* 2.40* 2.45*  --   CALCIUM 8.8* 8.7* 8.9 8.8* 8.9  --   MG 2.5* 2.6* 2.8* 2.7*  --  2.7*  PHOS 4.4 4.9* 5.7* 5.6* 6.2*  --    GFR: Estimated Creatinine Clearance: 22.5 mL/min (A) (by C-G formula based on SCr of 2.45  mg/dL (H)). Liver Function Tests: Recent Labs  Lab 02/07/20 0329 02/08/20 0339 02/09/20 0412 02/10/20 0416 02/11/20 0453  ALBUMIN 2.6* 2.6* 2.7* 2.6* 2.6*   No results for input(s): LIPASE, AMYLASE in the last 168 hours. No results for input(s): AMMONIA in the last 168 hours. Coagulation Profile: No results for input(s): INR, PROTIME in the last 168 hours. Cardiac Enzymes: No results for input(s): CKTOTAL, CKMB, CKMBINDEX, TROPONINI in the last 168 hours. BNP (last 3 results) No results for input(s): PROBNP in the last 8760 hours. HbA1C: No results for input(s): HGBA1C in the last 72 hours. CBG: Recent Labs  Lab 02/11/20 0003 02/11/20 0327 02/11/20 0750 02/11/20 1153 02/11/20 1557  GLUCAP 142* 124* 115* 156* 128*   Lipid Profile: No results for input(s): CHOL, HDL, LDLCALC, TRIG, CHOLHDL, LDLDIRECT in the last 72 hours. Thyroid Function Tests: No results for input(s): TSH, T4TOTAL, FREET4, T3FREE, THYROIDAB in the last 72 hours. Anemia Panel: No results for input(s): VITAMINB12, FOLATE, FERRITIN, TIBC, IRON, RETICCTPCT in the last 72 hours. Urine analysis:    Component Value Date/Time   COLORURINE AMBER (A) 01/13/2020 1302   APPEARANCEUR TURBID (A) 01/13/2020 1302   LABSPEC 1.018 01/13/2020 1302   PHURINE 5.0 01/13/2020 1302   GLUCOSEU NEGATIVE 01/13/2020 1302   HGBUR SMALL (A) 01/13/2020 1302   BILIRUBINUR NEGATIVE 01/13/2020 1302   KETONESUR NEGATIVE 01/13/2020 1302   PROTEINUR >=300 (A) 01/13/2020 1302   NITRITE NEGATIVE 01/13/2020 1302   LEUKOCYTESUR LARGE (A) 01/13/2020 1302   Sepsis Labs: '@LABRCNTIP'$ (procalcitonin:4,lacticidven:4)  )No results found for this or any previous visit (from the past 240 hour(s)).    Radiology Studies: No results found.   Scheduled Meds: . amLODipine  10 mg Oral Daily  . B-complex with vitamin C  1 tablet Per Tube Daily  . bisacodyl  10 mg Rectal Daily  . busPIRone  5 mg Per Tube BID  . calcium acetate  667 mg Oral  TID WC  . camphor-menthol   Topical QID  . chlorhexidine gluconate (MEDLINE KIT)  15 mL Mouth Rinse BID  . Chlorhexidine Gluconate Cloth  6 each Topical Q0600  . Chlorhexidine Gluconate Cloth  6 each Topical Q0600  . darbepoetin (ARANESP) injection - DIALYSIS  100 mcg Subcutaneous Q Wed-HD  . feeding supplement (NEPRO CARB STEADY)  1,000 mL Oral Q24H  . feeding supplement (PRO-STAT SUGAR FREE 64)  30 mL Per Tube TID  . guaiFENesin  10 mL Per Tube BID  . heparin  5,000 Units Subcutaneous Q8H  . insulin aspart  0-15 Units Subcutaneous Q4H  . pantoprazole sodium  40 mg Per Tube Q24H  . PARoxetine  40 mg Per Tube Daily  . sodium chloride flush  10-40 mL Intracatheter Q12H  . traZODone  25 mg Per Tube QHS   Continuous Infusions: . sodium chloride    . sodium chloride    . sodium chloride       LOS: 37 days   Time Spent in minutes   30 minutes  Author:  Berle Mull, MD [facility] 02/11/2020  7:11 PM   To reach On-call, see care teams to locate the attending and reach out to them via [email or web address]. If 7PM-7AM, please contact night-coverage If you still have difficulty reaching the attending provider, please page the [facility] (Director on Call) for [facility] on amion for assistance.

## 2020-02-11 NOTE — TOC Progression Note (Addendum)
Transition of Care [facility]) - Progression Note    Patient Details  Name: Bailey Cox MRN: [id number] Date of Birth: 1945-11-15  Transition of Care [facility]) CM/SW [place], [place] Phone Number: 02/11/2020, 10:33 AM  Clinical Narrative:  5:10pm- CSW spoke with Jaclyn Shaggy, she has spoken with family and they will try and work out plan to be here with pt. They are aware of denial of [facility] to accept transfer and that pt refused treatment and PT again today. CSW has requested from RN/RN leadership to see if pt visitors would be permitted to visit overnight. TOC team will f/u again on this request tomorrow.    3:33pm- Per Jaclyn Shaggy, CSW, renal navigator aware pt declined again today. Pt will likely be placed on schedule for tomorrow due to refusal and high volume of pts. CSW spoke with Jaclyn Shaggy who will reach out to daughter. Pt refused PT as well today. Pt has been approved to have 3rd visitor if able to be at bedside to encourage treatment. If pt continues to refuse will need ongoing [place].   2:54pm- Per documentation pt refused dialysis again today. This was not communicated from nephrology to this CSW or bedside RN Sharee Pimple. CSW has reached out to Renal Navigator Colleen to see if pt can be placed back on list.   10:33am- CSW has spoken with Jaclyn Shaggy, LCSW and Renal Navigator. Bedside RN Sharee Pimple will f/u with family if pt refuses dialysis again today. RN leadership has also approved a 3rd visitor if family member can come throughout the week to stay with patient and provide encouragement for treament.    Expected Discharge Plan: Skilled Nursing Facility Barriers to Discharge: Continued Medical Work up, Other (comment)(ability to tolerate dialysis in recliner)  Expected Discharge Plan and Services Expected Discharge Plan: [place] In-house Referral: Clinical Social Work Discharge Planning Services: CM Consult Post Acute Care Choice: [place] [facility]  arrangements for the past 2 months: (Has been hospitalized for over 4 months)    Readmission Risk Interventions Readmission Risk Prevention Plan 01/28/2020  Transportation Screening Complete  PCP or Specialist Appt within 3-5 Days Not Complete  Not Complete comments plan for SNF  HRI or [place] Not Complete  HRI or Home Care Consult comments plan for SNF  Social Work Consult for [place] Planning/Counseling Complete  Palliative Care Screening Complete  Medication Review Press photographer) Referral to Pharmacy  PCP or Specialist appointment within 3-5 days of discharge Not Complete  PCP/Specialist Appt Not Complete comments plan for SNF  HRI or Home Care Consult Complete  SW Recovery Care/Counseling Consult Complete  Palliative Care Screening Not Complete  Comments appropriate at this time  [place] Complete

## 2020-02-12 DIAGNOSIS — N179 Acute kidney failure, unspecified: Secondary | ICD-10-CM | POA: Diagnosis not present

## 2020-02-12 LAB — HEPATITIS B SURFACE ANTIGEN: Hepatitis B Surface Ag: NONREACTIVE

## 2020-02-12 LAB — RENAL FUNCTION PANEL
Albumin: 2.7 g/dL — ABNORMAL LOW (ref 3.5–5.0)
Anion gap: 12 (ref 5–15)
BUN: 184 mg/dL — ABNORMAL HIGH (ref 8–23)
CO2: 28 mmol/L (ref 22–32)
Calcium: 8.7 mg/dL — ABNORMAL LOW (ref 8.9–10.3)
Chloride: 93 mmol/L — ABNORMAL LOW (ref 98–111)
Creatinine, Ser: 2.62 mg/dL — ABNORMAL HIGH (ref 0.44–1.00)
GFR calc Af Amer: 20 mL/min — ABNORMAL LOW (ref >60)
GFR calc non Af Amer: 18 mL/min — ABNORMAL LOW (ref >60)
Glucose, Bld: 163 mg/dL — ABNORMAL HIGH (ref 70–99)
Phosphorus: 6.3 mg/dL — ABNORMAL HIGH (ref 2.5–4.6)
Potassium: 5.1 mmol/L (ref 3.5–5.1)
Sodium: 133 mmol/L — ABNORMAL LOW (ref 135–145)

## 2020-02-12 LAB — CBC
HCT: 26.2 % — ABNORMAL LOW (ref 36.0–46.0)
Hemoglobin: 7.9 g/dL — ABNORMAL LOW (ref 12.0–15.0)
MCH: 28 pg (ref 26.0–34.0)
MCHC: 30.2 g/dL (ref 30.0–36.0)
MCV: 92.9 fL (ref 80.0–100.0)
Platelets: 122 10*3/uL — ABNORMAL LOW (ref 150–400)
RBC: 2.82 MIL/uL — ABNORMAL LOW (ref 3.87–5.11)
RDW: 15.6 % — ABNORMAL HIGH (ref 11.5–15.5)
WBC: 5.2 10*3/uL (ref 4.0–10.5)
nRBC: 0 % (ref 0.0–0.2)

## 2020-02-12 LAB — GLUCOSE, CAPILLARY
Glucose-Capillary: 115 mg/dL — ABNORMAL HIGH (ref 70–99)
Glucose-Capillary: 117 mg/dL — ABNORMAL HIGH (ref 70–99)
Glucose-Capillary: 140 mg/dL — ABNORMAL HIGH (ref 70–99)
Glucose-Capillary: 167 mg/dL — ABNORMAL HIGH (ref 70–99)
Glucose-Capillary: 170 mg/dL — ABNORMAL HIGH (ref 70–99)

## 2020-02-12 LAB — MAGNESIUM: Magnesium: 2.8 mg/dL — ABNORMAL HIGH (ref 1.7–2.4)

## 2020-02-12 MED ORDER — HALOPERIDOL LACTATE 5 MG/ML IJ SOLN
1.0000 mg | Freq: Once | INTRAMUSCULAR | Status: AC
  Administered 2020-02-12: 2 mg via INTRAVENOUS
  Filled 2020-02-12: qty 0.4

## 2020-02-12 MED ORDER — HALOPERIDOL LACTATE 5 MG/ML IJ SOLN
2.0000 mg | Freq: Four times a day (QID) | INTRAMUSCULAR | Status: DC | PRN
  Administered 2020-02-17 – 2020-02-25 (×5): 2 mg via INTRAVENOUS
  Filled 2020-02-12 (×5): qty 1
  Filled 2020-02-12: qty 0.4
  Filled 2020-02-12: qty 1

## 2020-02-12 MED ORDER — HEPARIN SODIUM (PORCINE) 1000 UNIT/ML IJ SOLN
INTRAMUSCULAR | Status: AC
  Administered 2020-02-12: 3800 [IU] via INTRAVENOUS_CENTRAL
  Filled 2020-02-12: qty 4

## 2020-02-12 MED ORDER — LORAZEPAM 2 MG/ML IJ SOLN
INTRAMUSCULAR | Status: AC
  Filled 2020-02-12: qty 1

## 2020-02-12 MED ORDER — LORAZEPAM 2 MG/ML IJ SOLN
0.5000 mg | INTRAMUSCULAR | Status: DC | PRN
  Administered 2020-02-12: 0.5 mg via INTRAVENOUS

## 2020-02-12 MED ORDER — DARBEPOETIN ALFA 100 MCG/0.5ML IJ SOSY
100.0000 ug | PREFILLED_SYRINGE | INTRAMUSCULAR | Status: DC
  Administered 2020-02-12 – 2020-02-19 (×2): 100 ug via SUBCUTANEOUS
  Filled 2020-02-12: qty 0.5

## 2020-02-12 MED ORDER — DARBEPOETIN ALFA 100 MCG/0.5ML IJ SOSY
PREFILLED_SYRINGE | INTRAMUSCULAR | Status: AC
  Filled 2020-02-12: qty 0.5

## 2020-02-12 MED ORDER — QUETIAPINE FUMARATE 50 MG PO TABS
25.0000 mg | ORAL_TABLET | Freq: Two times a day (BID) | ORAL | Status: DC
  Administered 2020-02-12 – 2020-02-13 (×3): 25 mg via ORAL
  Filled 2020-02-12 (×4): qty 1

## 2020-02-12 NOTE — Consult Note (Signed)
  Attempted telepsychiatry assessment.  Patient currently appears sedated, patient oriented to self only at this time.  Patient unable to participate in assessment.  Psychiatry consult for "please provide consult for managment of agitation and psychosis." Patient does not appear agitated during my brief conversation.  I did meet with patient face-to-face on 02/03/2020.  Patient does appear more somnolent today than on the day of my prior assessment. If patient's agitation continues to be of concern recommend carefully consider increase in Seroquel nightly dose to 50 mg by mouth. Antipsychotics are associated with an increased risk of death in elderly patients with dementia.

## 2020-02-12 NOTE — Progress Notes (Signed)
PT Cancellation Note  Patient Details Name: Bailey Cox MRN: [id number] DOB: 1945-12-31   Cancelled Treatment:    Reason Eval/Treat Not Completed: Patient's level of consciousness. PT attempts to see pt for treatment however pt able to be aroused with verbal/tactile cues however only for a brief period before immediately returning to sleep. RN reports pt likely still being affected by medications from HDU session. PT will attempt to follow up as time allows.   Zenaida Niece 02/12/2020, 4:52 PM

## 2020-02-12 NOTE — Progress Notes (Signed)
PROGRESS NOTE    Bailey Cox  MLY:[id number] DOB: Nov 06, 1946 DOA: 01/05/2020 PCP: Patient, No Pcp Per   Brief Narrative:  HPI on 01/05/2020 by Mr. Georgann Housekeeper, NP (ICU) 74 year old female with PMH as below, which is significant for long admission in the [facility] system from 10/2019 all the way through 01/05/2020.  She was originally hospitalized in September 2020 with acute cholecystitis and underwent a cholecystectomy.  She was then sent to SNF for short duration but had return to [facility] to have a bile duct stent placed ultimately returning to SNF.  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  While hospitalized she developed fluid overload and was ultimately transferred to [facility] for dialysis.  Her course after that point become somewhat unclear, but as described by her family she was on and off the ventilator for several procedures and was eventually left intubated.  One of these procedures was an endoscopy during which she unfortunately suffered a cardiac arrest.  Details of the arrest are unclear.  She remained on the ventilator and ultimately underwent tracheostomy.  Her hospital course was also complicated by MRSA pneumonia.  She was ultimately able to come off of hemodialysis and was treated intermittently with diuresis.  It sounds like she bounced in and out of the ICU a few times with volume overload.  On the tail end of her admission she still struggled with kidney disease and was being treated for a urinary tract infection with cefepime and vancomycin.  Plans were to stop on 3/2.  She was discharged from the [facility] system on 3/1 to [facility] in [place].  Upon arrival to [facility] they deemed her too sick for admission and transferred her to [facility] emergency department with complaints of volume overload and acidosis.   Interim history Initially admitted by ICU this patient was ventilator dependent, into trach, which  is now been removed.  Patient started CRRT on 01/13/2020 until 01/16/2020 and transferred to the floor on 01/18/2020 as she was tolerating hemodialysis.  Nephrology has been seeing her and feels that she has AKI on CKD most likely being from ATN/sepsis.  Patient family wants aggressive measures.  CRRT was discontinued on 01/15/2020.  Patient now has [facility] and is scheduled for intermittent dialysis.  Continues to refuse to go to dialysis.  Ethics committee meeting 02/10/2020. 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   -Psychiatry consulted for capacity, patient does not have capacity to make her own decisions -Palliative care also consulted and pending family meeting-continues to be full code -Continues to refuse dialysis -Ethics consulted -Family plans to stay around to facilitate hemodialysis.  MRSA pneumonia -Completed antibiotics  Chronic respiratory failure -Patient did have tracheostomy however was decannulated on 01/23/2020 -Continue supportive measures -Weaned down to 2 L, currently maintaining oxygen saturations in the high 90s to 100%  Diabetes mellitus, type II -Hemoglobin A1c 5.7 -Continue insulin sliding scale CBG monitoring  Essential hypertension -Continue PRN metoprolol  Anemia of chronic kidney disease -Continue Aranesp -Monitor CBC  Hyponatremia -Resolved  Anxiety/depression Agitation -Continue Paxil, BuSpar, trazodone Significant agitation noted at hemodialysis on 02/12/2020. As needed Haldol and scheduled Seroquel.  Monitor.  GERD -Continue PPI  Goals of care -Palliative care consulted, patient remains full code -Of note palliative care discussion was had with  patient was at [facility].  Previous hospitalist discussed with her daughter, and understands prognosis is  guarded. -Psychiatry consulted for capacity, patient does not have capacity to make her own decisions -Ethics consulted  Nutritional support -She does not want to eat -currently has peg tube and on Tube feeds  DVT Prophylaxis  Heparin  Code Status: Full  Family Communication: None at bedside  Disposition Plan: Admitted from [facility]. Now with renal failure needing HD, but continues to refuse. Dispo TBD. LTAC has declined patient. Disposition pending patient's ability to dialyze in a chair.  [facility] has declined transfer.   Consultants Nephrology PCCM Psychiatry Palliative care Interventional radiology Ethics  Procedures  Echcoardiogram 01/06/2020 shows an EF of 65 to 16%, grade 2 diastolic dysfunction.  RVSP.  Antibiotics   Anti-infectives (From admission, onward)   Start     Dose/Rate Route Frequency Ordered Stop   01/10/20 0930  vancomycin (VANCOCIN) IVPB 1000 mg/200 mL premix  Status:  Discontinued     1,000 mg 200 mL/hr over 60 Minutes Intravenous  Once 01/10/20 0915 01/13/20 0623   01/06/20 2200  ceFEPIme (MAXIPIME) 2 g in sodium chloride 0.9 % 100 mL IVPB     2 g 200 mL/hr over 30 Minutes Intravenous Every 24 hours 01/05/20 2251 01/11/20 2234   01/05/20 2200  ceFEPIme (MAXIPIME) 2 g in sodium chloride 0.9 % 100 mL IVPB     2 g 200 mL/hr over 30 Minutes Intravenous  Once 01/05/20 2148 01/06/20 0015   01/05/20 2200  metroNIDAZOLE (FLAGYL) IVPB 500 mg     500 mg 100 mL/hr over 60 Minutes Intravenous  Once 01/05/20 2148 01/06/20 0015   01/05/20 2200  vancomycin (VANCOCIN) IVPB 1000 mg/200 mL premix  Status:  Discontinued     1,000 mg 200 mL/hr over 60 Minutes Intravenous  Once 01/05/20 2148 01/05/20 2151   01/05/20 2200  vancomycin (VANCOREADY) IVPB 2000 mg/400 mL  Status:  Discontinued     2,000 mg 200 mL/hr over 120 Minutes Intravenous  Once 01/05/20 2151 01/05/20 2223      Subjective:   Bailey Cox seen and examined today.  No acute complaint.   Significantly agitated.  No nausea no vomiting.  No fever no chills.  Objective:   Vitals:   02/12/20 1030 02/12/20 1100 02/12/20 1101 02/12/20 1240  BP: (!) 153/83 (!) 171/70 (!) 159/80 (!) 157/81  Pulse: 81 85 85 91  Resp:   18 20  Temp:   98.8 F (37.1 C) 98.2 F (36.8 C)  TempSrc:   Oral Oral  SpO2:   98% 96%  Weight:   83.4 kg   Height:        Intake/Output Summary (Last 24 hours) at 02/12/2020 1852 Last data filed at 02/12/2020 1500 Gross per 24 hour  Intake 1336 ml  Output 3000 ml  Net -1664 ml   Filed Weights   02/12/20 0334 02/12/20 0721 02/12/20 1101  Weight: 87.8 kg 86.2 kg 83.4 kg   Exam  General: Well developed, chronically ill-appearing, NAD  HEENT: NCAT, mucous membranes moist.   Cardiovascular: S1 S2 auscultated, RRR  Respiratory: Clear to auscultation bilaterally   Abdomen: Soft, nontender, nondistended, + bowel sounds, +peg  Extremities: warm dry without cyanosis clubbing. LE edema /L  Neuro: AAOx3, nonfocal  Psych: Appropriate mood and affect negative asterixis  Data Reviewed: I have personally reviewed following labs and imaging studies  CBC: Recent Labs  Lab 02/06/20 0431 02/06/20 0431 02/06/20 2004 02/07/20 0329 02/09/20  2947 02/11/20 0454 02/12/20 1605  WBC 5.7  --  5.2  --  4.9 5.3 5.2  HGB 8.3*   < > 8.4* 8.4* 8.4* 8.1* 7.9*  HCT 27.3*   < > 28.2* 28.0* 28.7* 27.0* 26.2*  MCV 94.1  --  94.0  --  95.0 94.7 92.9  PLT 139*  --  128*  --  126* 127* 122*   < > = values in this interval not displayed.   Basic Metabolic Panel: Recent Labs  Lab 02/08/20 0339 02/09/20 0412 02/10/20 0416 02/11/20 0453 02/11/20 0454 02/12/20 0359  NA 137 138 134* 132*  --  133*  K 4.3 4.5 4.8 4.6  --  5.1  CL 97* 98 94* 93*  --  93*  CO2 _0 --  28  GLUCOSE 136* 148* 133* 129*  --  163*  BUN 122* 143* 165* 175*  --  184*  CREATININE 2.05* 2.37* 2.40* 2.45*  --  2.62*  CALCIUM 8.7* 8.9 8.8* 8.9  --  8.7*  MG 2.6* 2.8* 2.7*  --  2.7*  2.8*  PHOS 4.9* 5.7* 5.6* 6.2*  --  6.3*   GFR: Estimated Creatinine Clearance: 20.6 mL/min (A) (by C-G formula based on SCr of 2.62 mg/dL (H)). Liver Function Tests: Recent Labs  Lab 02/08/20 0339 02/09/20 0412 02/10/20 0416 02/11/20 0453 02/12/20 0359  ALBUMIN 2.6* 2.7* 2.6* 2.6* 2.7*   No results for input(s): LIPASE, AMYLASE in the last 168 hours. No results for input(s): AMMONIA in the last 168 hours. Coagulation Profile: No results for input(s): INR, PROTIME in the last 168 hours. Cardiac Enzymes: No results for input(s): CKTOTAL, CKMB, CKMBINDEX, TROPONINI in the last 168 hours. BNP (last 3 results) No results for input(s): PROBNP in the last 8760 hours. HbA1C: No results for input(s): HGBA1C in the last 72 hours. CBG: Recent Labs  Lab 02/11/20 1946 02/11/20 2335 02/12/20 0329 02/12/20 1236 02/12/20 1753  GLUCAP 114* 143* 167* 170* 140*   Lipid Profile: No results for input(s): CHOL, HDL, LDLCALC, TRIG, CHOLHDL, LDLDIRECT in the last 72 hours. Thyroid Function Tests: No results for input(s): TSH, T4TOTAL, FREET4, T3FREE, THYROIDAB in the last 72 hours. Anemia Panel: No results for input(s): VITAMINB12, FOLATE, FERRITIN, TIBC, IRON, RETICCTPCT in the last 72 hours. Urine analysis:    Component Value Date/Time   COLORURINE AMBER (A) 01/13/2020 1302   APPEARANCEUR TURBID (A) 01/13/2020 1302   LABSPEC 1.018 01/13/2020 1302   PHURINE 5.0 01/13/2020 1302   GLUCOSEU NEGATIVE 01/13/2020 1302   HGBUR SMALL (A) 01/13/2020 1302   BILIRUBINUR NEGATIVE 01/13/2020 1302   KETONESUR NEGATIVE 01/13/2020 1302   PROTEINUR >=300 (A) 01/13/2020 1302   NITRITE NEGATIVE 01/13/2020 1302   LEUKOCYTESUR LARGE (A) 01/13/2020 1302   Sepsis Labs: _1 (procalcitonin:4,lacticidven:4)  )No results found for this or any previous visit (from the past 240 hour(s)).    Radiology Studies: No results found.   Scheduled Meds: . amLODipine  10 mg Oral Daily  . B-complex with  vitamin C  1 tablet Per Tube Daily  . bisacodyl  10 mg Rectal Daily  . busPIRone  5 mg Per Tube BID  . calcium acetate  667 mg Oral TID WC  . camphor-menthol   Topical QID  . chlorhexidine gluconate (MEDLINE KIT)  15 mL Mouth Rinse BID  . Chlorhexidine Gluconate Cloth  6 each Topical Q0600  . Chlorhexidine Gluconate Cloth  6 each Topical Q0600  . darbepoetin (ARANESP) injection - DIALYSIS  100 mcg Subcutaneous Q Thu-HD  . feeding supplement (NEPRO CARB STEADY)  1,000 mL Oral Q24H  . feeding supplement (PRO-STAT SUGAR FREE 64)  30 mL Per Tube TID  . guaiFENesin  10 mL Per Tube BID  . heparin  5,000 Units Subcutaneous Q8H  . insulin aspart  0-15 Units Subcutaneous Q4H  . pantoprazole sodium  40 mg Per Tube Q24H  . PARoxetine  40 mg Per Tube Daily  . QUEtiapine  25 mg Oral BID  . sodium chloride flush  10-40 mL Intracatheter Q12H  . traZODone  25 mg Per Tube QHS   Continuous Infusions: . sodium chloride       LOS: 38 days   Time Spent in minutes   30 minutes  Author:  Berle Mull, MD [facility] 02/12/2020  6:52 PM   To reach On-call, see care teams to locate the attending and reach out to them via [email or web address]. If 7PM-7AM, please contact night-coverage If you still have difficulty reaching the attending provider, please page the [facility] (Director on Call) for [facility] on amion for assistance.

## 2020-02-12 NOTE — Progress Notes (Signed)
Upon walking in to the HD unit this morning, at approximately 8:25am, patient was yelling loudly for "help" and to "take me off" and "I'm scared!" Renal Navigator asked to hold her hand, which she welcomed. She asked Navigator to "go with me" and "don't leave me." Navigator called patient's daughter Bailey Cox so that she could hear patient and talk with her. Patient calmed, briefly and then told Navigator that [place] wanted to speak to me again. Bailey Cox asked if we could give her mother some medication to calm her down. I informed her that something has been ordered. Bailey Cox asked to be kept informed of how her mother is doing. Patient continually asked Renal Navigator for help. Renal Navigator explained that we are helping her by providing dialysis and that this is keeping her alive. She continued to state that she wants to stay alive and understands that in that case, she needs dialysis. Navigator explained to her that [place] and [place] want her alive too and want her to come home to be with them to live (as was discussed in Ethics meeting on 4/6). Patient would respond with "ok," and nod her head in agreement. She asked over and over when her medication would come. She was in agreement of taking something to make her feel calm. Even after being given the prescribed Haldol, patient's agitation did not decrease, though Navigator notes that she was initially able to de-escalate with with constant verbal redirection. This went on for over an hour and eventually, her agitation escalated again when I had to step away from her bedside. She cried out not to leave her, asked for help again, asked to be taken off, etc. I again called her daughter, Bailey Cox and explained how difficult it is to watch this unfold. Bailey Cox states that she had not been told any of this until today. She asked if there was a different medication that could be given. Navigator had already informed Dr. Carolin Sicks of the situation and informed Bailey Cox of this. Dr.  Carolin Sicks suggested that we could try Ativan and Bailey Cox wanted this. Navigator stated that we cannot sedate patient for OP HD treatment and that she will need someone to be with her, if there is any chance of her succeeding. Bailey Cox states she was told that no one would be allowed on to the HD unit to be with patient. Navigator can get an exception made in cases like this. Navigator plans to follow up with Bailey Cox once patient is no longer in the unit, as she was again calling out for Navigator to return to her bedside.  Patient appears miserable. She is clearly in distress. She is picking at her leg, but will reach out and hold my hand. I called PMT RN Larina Bras to discuss this ongoing difficult case and requested that she meet patient at bedside to observe and provide support. Navigator appreciates visit by PMT RN.  Navigator provided thorough update regarding concerns to Dr. Carolin Sicks, CSW/I. [place], and will follow up with [facility]. Navigator to continue to follow.  Bailey Cox, [place] Renal Navigator [phone]

## 2020-02-12 NOTE — Progress Notes (Signed)
Nutrition Follow-up  DOCUMENTATION CODES:   Morbid obesity  INTERVENTION:   ContinueNepro@ 40m/hr via PEG  332mProstat TID.   Tube feeding regimen provides2028kcal (100% of needs),123grams of protein, and 69843mf H2O.  NUTRITION DIAGNOSIS:   Inadequate oral intake related to inability to eat as evidenced by NPO status.  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 She remains dependent on TF: Nepro @ 40 ml/hr with 30 ml Prostat TID; regimen provides 2028 kcals, 123 grams protein, and 698 ml water daily, meeting 100% of estimated kcal and protein needs.  Pt has a history of refusing HD, however, agreed to go today. Leadership has agreed to allow additional visitation from family in order to help support pt.   Labs reviewed: Na: 133, Phos: 6.3, Mg: 2.8, CBGS: 114-167 (inpatient orders for glycemic control are 0-15 units inuslin aspart every 4 hours).   Diet Order:   Diet Order            Diet NPO time specified Except for: Ice Chips, Other (See Comments)  Diet effective  now              EDUCATION NEEDS:   No education needs have been identified at this time  Skin:  Skin Assessment: Reviewed RN Assessment(L groin: open wound/MASD)  Last BM:  02/12/20  Height:   Ht Readings from Last 1 Encounters:  01/05/20 '5\' 4"'$  (1.626 m)    Weight:   Wt Readings from Last 1 Encounters:  02/12/20 86.2 kg    Ideal Body Weight:  54.5 kg  BMI:  Body mass index is 32.62 kg/m.  Estimated Nutritional Needs:   Kcal:  1700-1900  Protein:  110-136 grams  Fluid:  >1.5 L/day    JenLoistine ChanceD, LDN, CDCPondergistered Dietitian II Certified Diabetes Care and Education Specialist Please refer to [employer] RD and/or RD on-call/weekend/after hours pager

## 2020-02-12 NOTE — Procedures (Signed)
Patient was seen on dialysis and the procedure was supervised.  BFR 400  Via TDC BP is  154/72.  Patient agreed for dialysis today.  Increase HD time to 3.5 hours.  Social worker discussing with the family member regarding discharge planning.  Continue TTS schedule if patient agrees for dialysis.   Patient appears to be tolerating treatment well  Bailey Cox 02/12/2020

## 2020-02-12 NOTE — TOC Progression Note (Signed)
Transition of Care [facility]) - Progression Note    Patient Details  Name: Bailey Cox MRN: [id number] Date of Birth: 06/14/1946  Transition of Care [facility]) CM/SW [place], [place] Phone Number: 02/12/2020, 10:20 AM  Clinical Narrative:    CSW noted that pt off floor in dialysis unit this morning. CSW called and spoke with [facility], renal navigator. CSW able to hear pt calling for help in the background. Per Colleens report she called and spoke with pt daughter [name] for her to speak with pt.  TOC team continues to follow, appreciate assistance of renal navigator in order to obtain full picture of treatment. Pt cannot be set up at outpatient dialysis center until she is able to tolerate and agree to consistent treatment in a recliner.   This has been discussed both at ethics meeting on 4/6 and during LOS meeting with [place]. RN leadership on the floor has agreed to permit a third visitor and due to pt confusion would be able to stay overnight as needed.      Expected Discharge Plan: Skilled Nursing Facility Barriers to Discharge: Continued Medical Work up, Other (comment)(ability to tolerate dialysis in recliner)  Expected Discharge Plan and Services Expected Discharge Plan: [place] In-house Referral: Clinical Social Work Discharge Planning Services: CM Consult Post Acute Care Choice: [place] Living arrangements for the past 2 months: (Has been hospitalized for over 4 months)  Readmission Risk Interventions Readmission Risk Prevention Plan 01/28/2020  Transportation Screening Complete  PCP or Specialist Appt within 3-5 Days Not Complete  Not Complete comments plan for SNF  HRI or [place] Not Complete  HRI or Home Care Consult comments plan for SNF  Social Work Consult for [place] Planning/Counseling Complete  Palliative Care Screening Complete  Medication Review Press photographer) Referral to Pharmacy  PCP or  Specialist appointment within 3-5 days of discharge Not Complete  PCP/Specialist Appt Not Complete comments plan for SNF  HRI or Home Care Consult Complete  SW Recovery Care/Counseling Consult Complete  Palliative Care Screening Not Complete  Comments appropriate at this time  [place] Complete

## 2020-02-12 NOTE — Progress Notes (Signed)
Psyche team attempted to speak to the patient via IPad, but patient was still sleepy and barely opened her eyes. She just came back from dialysis and so psych team will try again next time.

## 2020-02-12 NOTE — Progress Notes (Signed)
Palliative Medicine RN Note: Called to pt's bedside in HD by Terri Piedra, renal SW. I could hear [place] in the background yelling/crying out "Please stop!" and "Help!" while she is on HD. Jaclyn Shaggy has been at bedside in HD & reports she has already spoken with family and Dr Carolin Sicks.  I arrived in HD and spoke with her RN Dauda. He gave Haldol already and is about to give Ativan. Jaclyn Shaggy is at bedside holding San[place]'s hand. Marithza states "It hurts" and gestures to her groin but does not allow me to look at the area. She is constantly picking at her sheets and doesn't want them on her legs. I attempted to straighten her gown, but she waved my hand away. Colleen stepped away to make a phone call, and Jannell became very distressed, despite Jaclyn Shaggy still being within 10 feet; she was only comforted when [place] was standing at the bed.   Dniyah repeatedly attempted to remove her nasal cannula and required frequent (q 5 minute) reminders to leave it on. She was also stating "I'm scared," but could not tell us why or of what, and she was asking "[name], stop them!"   After about 30 minutes, Katina began to relax but continued to pick. She allowed me to straighten her gown, which had urine on it. She refused to let us change her bed (I called 6N RN and communicated this), but she did allow me to change her gown; she did not want Dauda to do it. Likely her Purewick is dislodged, which is causing her groin/crotch pain.  I fully expect that Hanah will need a family member to be present at every HD session to ensure compliance and minimize existential/psychological suffering, both here and at outpatient sessions. This was discussed with Jaclyn Shaggy as well, and we will defer those conversations to the renal team.   Discussed visit with PMT NP Jinny Blossom, who has seen [name] previously. She is deferring treatment of agitation and anxiety to psychiatry, who has been consulted by Dr Posey Pronto.   Marjie Skiff Tayvien Kane, RN, BSN,  [facility] Palliative Medicine Team 02/12/2020 11:24 AM Office [phone]

## 2020-02-12 NOTE — Progress Notes (Signed)
Haldol 2 mg given to patient, the remaining 3 mg wasted with the charge nurse in the steri cycle.

## 2020-02-12 NOTE — Progress Notes (Signed)
An IV team consult was placed for the patient's labs to be drawn. Upon assessment of the PICC line, I noticed the PICC line was pulled out about 8cms. I called a co worker to look at the PICC line for me and she agreed the line was pulled out to far and may need a chest X ray to confirm placement. We notified the MD Berle Mull, he agreed to place an order for PICC line removal and asked [place] would we place another PIV.

## 2020-02-13 DIAGNOSIS — N179 Acute kidney failure, unspecified: Secondary | ICD-10-CM | POA: Diagnosis not present

## 2020-02-13 LAB — CBC
HCT: 28.7 % — ABNORMAL LOW (ref 36.0–46.0)
Hemoglobin: 8.4 g/dL — ABNORMAL LOW (ref 12.0–15.0)
MCH: 27.4 pg (ref 26.0–34.0)
MCHC: 29.3 g/dL — ABNORMAL LOW (ref 30.0–36.0)
MCV: 93.5 fL (ref 80.0–100.0)
Platelets: 131 10*3/uL — ABNORMAL LOW (ref 150–400)
RBC: 3.07 MIL/uL — ABNORMAL LOW (ref 3.87–5.11)
RDW: 15.9 % — ABNORMAL HIGH (ref 11.5–15.5)
WBC: 4.2 10*3/uL (ref 4.0–10.5)
nRBC: 0 % (ref 0.0–0.2)

## 2020-02-13 LAB — RENAL FUNCTION PANEL
Albumin: 2.7 g/dL — ABNORMAL LOW (ref 3.5–5.0)
Anion gap: 12 (ref 5–15)
BUN: 76 mg/dL — ABNORMAL HIGH (ref 8–23)
CO2: 30 mmol/L (ref 22–32)
Calcium: 8.8 mg/dL — ABNORMAL LOW (ref 8.9–10.3)
Chloride: 95 mmol/L — ABNORMAL LOW (ref 98–111)
Creatinine, Ser: 1.49 mg/dL — ABNORMAL HIGH (ref 0.44–1.00)
GFR calc Af Amer: 41 mL/min — ABNORMAL LOW (ref >60)
GFR calc non Af Amer: 35 mL/min — ABNORMAL LOW (ref >60)
Glucose, Bld: 119 mg/dL — ABNORMAL HIGH (ref 70–99)
Phosphorus: 4.1 mg/dL (ref 2.5–4.6)
Potassium: 4.1 mmol/L (ref 3.5–5.1)
Sodium: 137 mmol/L (ref 135–145)

## 2020-02-13 LAB — GLUCOSE, CAPILLARY
Glucose-Capillary: 138 mg/dL — ABNORMAL HIGH (ref 70–99)
Glucose-Capillary: 101 mg/dL — ABNORMAL HIGH (ref 70–99)
Glucose-Capillary: 128 mg/dL — ABNORMAL HIGH (ref 70–99)
Glucose-Capillary: 133 mg/dL — ABNORMAL HIGH (ref 70–99)
Glucose-Capillary: 102 mg/dL — ABNORMAL HIGH (ref 70–99)

## 2020-02-13 LAB — MAGNESIUM: Magnesium: 2.3 mg/dL (ref 1.7–2.4)

## 2020-02-13 MED ORDER — QUETIAPINE FUMARATE 50 MG PO TABS: 25.0000 mg | ORAL_TABLET | Freq: Every day | ORAL | Status: DC

## 2020-02-13 MED ORDER — QUETIAPINE FUMARATE 50 MG PO TABS
25.0000 mg | ORAL_TABLET | Freq: Every day | ORAL | Status: DC
  Administered 2020-02-13: 22:00:00 25 mg via ORAL
  Filled 2020-02-13: qty 1

## 2020-02-13 MED ORDER — CHLORHEXIDINE GLUCONATE CLOTH 2 % EX PADS: 6.0000 | MEDICATED_PAD | Freq: Every day | CUTANEOUS | Status: DC
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
## 2020-02-13 NOTE — TOC Progression Note (Signed)
Transition of Care [facility]) - Progression Note    Patient Details  Name: Bailey Cox MRN: [id number] Date of Birth: August 22, 1946  Transition of Care [facility]) CM/SW [place], [place] Phone Number: 02/13/2020, 10:06 AM  Clinical Narrative:    CSW spoke with Jaclyn Shaggy, LCSW, renal navigator. Pt daughter has given the name of the three visitors that she would like for the visitation to include: -[place]  These three visitors have permission to stay with pt overnight as needed due to pt confusion per RN leadership on 6N.   CSW called and discussed this with [place]. Encouraged Sandy to call and let the staff know when they are coming to hopefully prevent any issues at check in. Sandy aware. [name] herself is planning on coming Tuesday/Wednesday.   Expected Discharge Plan: Skilled Nursing Facility Barriers to Discharge: Continued Medical Work up, Other (comment)(ability to tolerate dialysis in recliner)  Expected Discharge Plan and Services Expected Discharge Plan: [place] In-house Referral: Clinical Social Work Discharge Planning Services: CM Consult Post Acute Care Choice: [place] Living arrangements for the past 2 months: (Has been hospitalized for over 4 months)  Readmission Risk Interventions Readmission Risk Prevention Plan 01/28/2020  Transportation Screening Complete  PCP or Specialist Appt within 3-5 Days Not Complete  Not Complete comments plan for SNF  HRI or [place] Not Complete  HRI or Home Care Consult comments plan for SNF  Social Work Consult for [place] Planning/Counseling Complete  Palliative Care Screening Complete  Medication Review Press photographer) Referral to Pharmacy  PCP or Specialist appointment within 3-5 days of discharge Not Complete  PCP/Specialist Appt Not Complete comments plan for SNF  HRI or Home Care Consult Complete  SW Recovery Care/Counseling Consult  Complete  Palliative Care Screening Not Complete  Comments appropriate at this time  [place] Complete

## 2020-02-13 NOTE — Progress Notes (Signed)
PT Cancellation Note/ Discharge  Patient Details Name: Bailey Cox MRN: [id number] DOB: [birth year]   Cancelled Treatment:    Reason Eval/Treat Not Completed: Patient declined, no reason specified(Pt stated "I'm not doing anything". I informed her of cancellation/discharge policy and she was completely agreeable with no desire to perform therapy. Will sign off.)   Rayvn Rickerson B Ephriam Turman 02/13/2020, 9:25 AM  Bayard Males, PT Acute Rehabilitation Services Pager: [phone] Office: [phone]

## 2020-02-13 NOTE — Progress Notes (Signed)
PROGRESS NOTE    Bailey Cox  MLY:[id number] DOB: Nov 06, 1946 DOA: 01/05/2020 PCP: Patient, No Pcp Per   Brief Narrative:  HPI on 01/05/2020 by Mr. Georgann Housekeeper, NP (ICU) 74 year old female with PMH as below, which is significant for long admission in the [facility] system from 10/2019 all the way through 01/05/2020.  She was originally hospitalized in September 2020 with acute cholecystitis and underwent a cholecystectomy.  She was then sent to SNF for short duration but had return to [facility] to have a bile duct stent placed ultimately returning to SNF.  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  While hospitalized she developed fluid overload and was ultimately transferred to [facility] for dialysis.  Her course after that point become somewhat unclear, but as described by her family she was on and off the ventilator for several procedures and was eventually left intubated.  One of these procedures was an endoscopy during which she unfortunately suffered a cardiac arrest.  Details of the arrest are unclear.  She remained on the ventilator and ultimately underwent tracheostomy.  Her hospital course was also complicated by MRSA pneumonia.  She was ultimately able to come off of hemodialysis and was treated intermittently with diuresis.  It sounds like she bounced in and out of the ICU a few times with volume overload.  On the tail end of her admission she still struggled with kidney disease and was being treated for a urinary tract infection with cefepime and vancomycin.  Plans were to stop on 3/2.  She was discharged from the [facility] system on 3/1 to [facility] in [place].  Upon arrival to [facility] they deemed her too sick for admission and transferred her to [facility] emergency department with complaints of volume overload and acidosis.   Interim history Initially admitted by ICU this patient was ventilator dependent, into trach, which  is now been removed.  Patient started CRRT on 01/13/2020 until 01/16/2020 and transferred to the floor on 01/18/2020 as she was tolerating hemodialysis.  Nephrology has been seeing her and feels that she has AKI on CKD most likely being from ATN/sepsis.  Patient family wants aggressive measures.  CRRT was discontinued on 01/15/2020.  Patient now has [facility] and is scheduled for intermittent dialysis.  Continues to refuse to go to dialysis.  Ethics committee meeting 02/10/2020. 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   -Psychiatry consulted for capacity, patient does not have capacity to make her own decisions -Palliative care also consulted and pending family meeting-continues to be full code -Continues to refuse dialysis -Ethics consulted -Family plans to stay around to facilitate hemodialysis.  MRSA pneumonia -Completed antibiotics  Chronic respiratory failure -Patient did have tracheostomy however was decannulated on 01/23/2020 -Continue supportive measures -Weaned down to 2 L, currently maintaining oxygen saturations in the high 90s to 100%  Diabetes mellitus, type II -Hemoglobin A1c 5.7 -Continue insulin sliding scale CBG monitoring  Essential hypertension -Continue PRN metoprolol  Anemia of chronic kidney disease -Continue Aranesp -Monitor CBC  Hyponatremia -Resolved  Anxiety/depression Agitation -Continue Paxil, BuSpar, trazodone Significant agitation noted at hemodialysis on 02/12/2020. As needed Haldol and scheduled Seroquel.  Monitor.  GERD -Continue PPI  Goals of care -Palliative care consulted, patient remains full code -Of note palliative care discussion was had with  patient was at [facility].  Previous hospitalist discussed with her daughter, and understands prognosis is guarded.  -Psychiatry consulted for capacity, patient does not have capacity to make her own decisions -Ethics consulted  Nutritional support -She does not want to eat -currently has peg tube and on Tube feeds  DVT Prophylaxis  Heparin  Code Status: Full  Family Communication: None at bedside  Disposition Plan: Admitted from [place]. Now with renal failure needing HD, but continues to refuse. Dispo TBD. LTAC has declined patient. Disposition pending patient's ability to dialyze in a chair.  [facility] has declined transfer.   Consultants Nephrology PCCM Psychiatry Palliative care Interventional radiology Ethics  Procedures  Echcoardiogram 01/06/2020 shows an EF of 65 to 43%, grade 2 diastolic dysfunction.  RVSP.  Antibiotics   Anti-infectives (From admission, onward)   Start     Dose/Rate Route Frequency Ordered Stop   01/10/20 0930  vancomycin (VANCOCIN) IVPB 1000 mg/200 mL premix  Status:  Discontinued     1,000 mg 200 mL/hr over 60 Minutes Intravenous  Once 01/10/20 0915 01/13/20 0623   01/06/20 2200  ceFEPIme (MAXIPIME) 2 g in sodium chloride 0.9 % 100 mL IVPB     2 g 200 mL/hr over 30 Minutes Intravenous Every 24 hours 01/05/20 2251 01/11/20 2234   01/05/20 2200  ceFEPIme (MAXIPIME) 2 g in sodium chloride 0.9 % 100 mL IVPB     2 g 200 mL/hr over 30 Minutes Intravenous  Once 01/05/20 2148 01/06/20 0015   01/05/20 2200  metroNIDAZOLE (FLAGYL) IVPB 500 mg     500 mg 100 mL/hr over 60 Minutes Intravenous  Once 01/05/20 2148 01/06/20 0015   01/05/20 2200  vancomycin (VANCOCIN) IVPB 1000 mg/200 mL premix  Status:  Discontinued     1,000 mg 200 mL/hr over 60 Minutes Intravenous  Once 01/05/20 2148 01/05/20 2151   01/05/20 2200  vancomycin (VANCOREADY) IVPB 2000 mg/400 mL  Status:  Discontinued     2,000 mg 200 mL/hr over 120 Minutes Intravenous  Once 01/05/20 2151 01/05/20 2223     Subjective:   Bailey Cox seen and examined today.  Agitation is now under control.  No  nausea no vomiting.  No acute events overnight.  Objective:   Vitals:   02/12/20 1240 02/12/20 1956 02/13/20 0350 02/13/20 1434  BP: (!) 157/81 140/63 (!) 154/62 (!) 136/56  Pulse: 91 76 78 76  Resp: _0 Temp: 98.2 F (36.8 C) 99.1 F (37.3 C) 98.4 F (36.9 C) 98.7 F (37.1 C)  TempSrc: Oral Axillary Oral Oral  SpO2: 96% 98% 100% 98%  Weight:      Height:        Intake/Output Summary (Last 24 hours) at 02/13/2020 1756 Last data filed at 02/13/2020 0357 Gross per 24 hour  Intake -  Output 150 ml  Net -150 ml   Filed Weights   02/12/20 0334 02/12/20 0721 02/12/20 1101  Weight: 87.8 kg 86.2 kg 83.4 kg   Exam  General: Well developed, chronically ill-appearing, NAD  HEENT: NCAT, mucous membranes moist.   Cardiovascular: S1 S2 auscultated, RRR  Respiratory: Clear to auscultation bilaterally   Abdomen: Soft, nontender, nondistended, + bowel sounds, +peg  Extremities: warm dry without cyanosis clubbing. LE edema /L  Neuro: AAOx3, nonfocal  Psych: Appropriate mood and affect negative asterixis  Data Reviewed: I have personally reviewed following labs and imaging studies  CBC: Recent Labs  Lab 02/06/20 2004 02/06/20 2004 02/07/20 1540 02/09/20 0867  02/11/20 0454 02/12/20 1605 02/13/20 0627  WBC 5.2  --   --  4.9 5.3 5.2 4.2  HGB 8.4*   < > 8.4* 8.4* 8.1* 7.9* 8.4*  HCT 28.2*   < > 28.0* 28.7* 27.0* 26.2* 28.7*  MCV 94.0  --   --  95.0 94.7 92.9 93.5  PLT 128*  --   --  126* 127* 122* 131*   < > = values in this interval not displayed.   Basic Metabolic Panel: Recent Labs  Lab 02/09/20 0412 02/10/20 0416 02/11/20 0453 02/11/20 0454 02/12/20 0359 02/13/20 0627  NA 138 134* 132*  --  133* 137  K 4.5 4.8 4.6  --  5.1 4.1  CL 98 94* 93*  --  93* 95*  CO2 _0 --  28 30  GLUCOSE 148* 133* 129*  --  163* 119*  BUN 143* 165* 175*  --  184* 76*  CREATININE 2.37* 2.40* 2.45*  --  2.62* 1.49*  CALCIUM 8.9 8.8* 8.9  --  8.7* 8.8*  MG 2.8* 2.7*   --  2.7* 2.8* 2.3  PHOS 5.7* 5.6* 6.2*  --  6.3* 4.1   GFR: Estimated Creatinine Clearance: 36.2 mL/min (A) (by C-G formula based on SCr of 1.49 mg/dL (H)). Liver Function Tests: Recent Labs  Lab 02/09/20 0412 02/10/20 0416 02/11/20 0453 02/12/20 0359 02/13/20 0627  ALBUMIN 2.7* 2.6* 2.6* 2.7* 2.7*   No results for input(s): LIPASE, AMYLASE in the last 168 hours. No results for input(s): AMMONIA in the last 168 hours. Coagulation Profile: No results for input(s): INR, PROTIME in the last 168 hours. Cardiac Enzymes: No results for input(s): CKTOTAL, CKMB, CKMBINDEX, TROPONINI in the last 168 hours. BNP (last 3 results) No results for input(s): PROBNP in the last 8760 hours. HbA1C: No results for input(s): HGBA1C in the last 72 hours. CBG: Recent Labs  Lab 02/12/20 2340 02/13/20 0347 02/13/20 0734 02/13/20 1137 02/13/20 1559  GLUCAP 117* 138* 101* 128* 133*   Lipid Profile: No results for input(s): CHOL, HDL, LDLCALC, TRIG, CHOLHDL, LDLDIRECT in the last 72 hours. Thyroid Function Tests: No results for input(s): TSH, T4TOTAL, FREET4, T3FREE, THYROIDAB in the last 72 hours. Anemia Panel: No results for input(s): VITAMINB12, FOLATE, FERRITIN, TIBC, IRON, RETICCTPCT in the last 72 hours. Urine analysis:    Component Value Date/Time   COLORURINE AMBER (A) 01/13/2020 1302   APPEARANCEUR TURBID (A) 01/13/2020 1302   LABSPEC 1.018 01/13/2020 1302   PHURINE 5.0 01/13/2020 1302   GLUCOSEU NEGATIVE 01/13/2020 1302   HGBUR SMALL (A) 01/13/2020 1302   BILIRUBINUR NEGATIVE 01/13/2020 1302   KETONESUR NEGATIVE 01/13/2020 1302   PROTEINUR >=300 (A) 01/13/2020 1302   NITRITE NEGATIVE 01/13/2020 1302   LEUKOCYTESUR LARGE (A) 01/13/2020 1302   Sepsis Labs: _1 (procalcitonin:4,lacticidven:4)  )No results found for this or any previous visit (from the past 240 hour(s)).    Radiology Studies: No results found.   Scheduled Meds: . amLODipine  10 mg Oral Daily  .  B-complex with vitamin C  1 tablet Per Tube Daily  . bisacodyl  10 mg Rectal Daily  . busPIRone  5 mg Per Tube BID  . calcium acetate  667 mg Oral TID WC  . camphor-menthol   Topical QID  . chlorhexidine gluconate (MEDLINE KIT)  15 mL Mouth Rinse BID  . Chlorhexidine Gluconate Cloth  6 each Topical Q0600  . darbepoetin (ARANESP) injection - DIALYSIS  100 mcg Subcutaneous Q Thu-HD  . feeding supplement (  NEPRO CARB STEADY)  1,000 mL Oral Q24H  . feeding supplement (PRO-STAT SUGAR FREE 64)  30 mL Per Tube TID  . guaiFENesin  10 mL Per Tube BID  . heparin  5,000 Units Subcutaneous Q8H  . insulin aspart  0-15 Units Subcutaneous Q4H  . pantoprazole sodium  40 mg Per Tube Q24H  . PARoxetine  40 mg Per Tube Daily  . QUEtiapine  25 mg Oral BID  . sodium chloride flush  10-40 mL Intracatheter Q12H  . traZODone  25 mg Per Tube QHS   Continuous Infusions: . sodium chloride       LOS: 39 days   Time Spent in minutes   30 minutes  Author:  Berle Mull, MD [facility] 02/13/2020  5:56 PM   To reach On-call, see care teams to locate the attending and reach out to them via [email or web address]. If 7PM-7AM, please contact night-coverage If you still have difficulty reaching the attending provider, please page the [facility] (Director on Call) for [facility] on amion for assistance.

## 2020-02-13 NOTE — Progress Notes (Signed)
[facility] NEPHROLOGY PROGRESS NOTE  Assessment/ Plan: Pt is a 74 y.o. yo female with history of prolonged hospitalization at [facility] for PEA arrest, MRSA pneumonia, tracheostomy, AKI on HD transferred to [facility] where she was found to have fluid overload and metabolic acidosis.  Moved to [facility] for further evaluation.  #AKI on CKD stage IV suspected due to sepsis/ATN.  She was previously on CRRT and then later transitioned to intermittent HD when hemodynamically stable.   She was refusing dialysis therefore seen by psychiatry, palliative care and ethics consult.  Needs to have a family member with her during treatment.  Last dialysis yesterday with episodes of agitation.  Had treatment for 3 and half hour with around 2 L UF.  We will continue TTS schedule.  Discussed with renal navigator and primary team.  #Agitation, encephalopathy: Psychiatry was consulted to manage her psychosis/agitation.  She was calm and comfortable today.  #Sepsis MRSA pneumonia: Completed antibiotics.  #History of PEA arrest with chronic respiratory failure: The tracheostomy was decannulated.  Now on oxygen.  #Anemia of chronic illness: Iron acceptable.  Continue ESA.  #Hypertension: Continue amlodipine and metoprolol.  UF during HD.   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  Phosphorus at goal, continue PhosLo.  # Disposition: Difficult situation because patient is refusing dialysis.    Subjective: Seen and examined at bedside.  He had dialysis yesterday however required sedatives during treatment.  Today she is calm comfortable.  No nausea vomiting chest pain or shortness of breath. Objective Vital signs in last 24 hours: Vitals:   02/12/20 1101 02/12/20 1240 02/12/20 1956 02/13/20 0350  BP: (!) 159/80 (!) 157/81 140/63 (!) 154/62  Pulse: 85 91 76 78  Resp: '18 20 18 16  '$ Temp: 98.8 F (37.1 C) 98.2 F (36.8 C) 99.1 F (37.3 C) 98.4 F (36.9 C)  TempSrc: Oral Oral Axillary Oral  SpO2: 98% 96% 98% 100%   Weight: 83.4 kg     Height:       Weight change: -1.6 kg  Intake/Output Summary (Last 24 hours) at 02/13/2020 1125 Last data filed at 02/13/2020 0357 Gross per 24 hour  Intake 386 ml  Output 150 ml  Net 236 ml       Labs: Basic Metabolic Panel: Recent Labs  Lab 02/11/20 0453 02/12/20 0359 02/13/20 0627  NA 132* 133* 137  K 4.6 5.1 4.1  CL 93* 93* 95*  CO2 '29 28 30  '$ GLUCOSE 129* 163* 119*  BUN 175* 184* 76*  CREATININE 2.45* 2.62* 1.49*  CALCIUM 8.9 8.7* 8.8*  PHOS 6.2* 6.3* 4.1   Liver Function Tests: Recent Labs  Lab 02/11/20 0453 02/12/20 0359 02/13/20 0627  ALBUMIN 2.6* 2.7* 2.7*   No results for input(s): LIPASE, AMYLASE in the last 168 hours. No results for input(s): AMMONIA in the last 168 hours. CBC: Recent Labs  Lab 02/06/20 2004 02/07/20 0329 02/09/20 0412 02/09/20 0412 02/11/20 0454 02/12/20 1605 02/13/20 0627  WBC 5.2  --  4.9   < > 5.3 5.2 4.2  HGB 8.4*   < > 8.4*   < > 8.1* 7.9* 8.4*  HCT 28.2*   < > 28.7*   < > 27.0* 26.2* 28.7*  MCV 94.0  --  95.0  --  94.7 92.9 93.5  PLT 128*  --  126*   < > 127* 122* 131*   < > = values in this interval not displayed.   Cardiac Enzymes: No results for input(s): CKTOTAL, CKMB, CKMBINDEX, TROPONINI in  the last 168 hours. CBG: Recent Labs  Lab 02/12/20 1753 02/12/20 1952 02/12/20 2340 02/13/20 0347 02/13/20 0734  GLUCAP 140* 115* 117* 138* 101*    Iron Studies: No results for input(s): IRON, TIBC, TRANSFERRIN, FERRITIN in the last 72 hours. Studies/Results: No results found.  Medications: Infusions: . sodium chloride      Scheduled Medications: . amLODipine  10 mg Oral Daily  . B-complex with vitamin C  1 tablet Per Tube Daily  . bisacodyl  10 mg Rectal Daily  . busPIRone  5 mg Per Tube BID  . calcium acetate  667 mg Oral TID WC  . camphor-menthol   Topical QID  . chlorhexidine gluconate (MEDLINE KIT)  15 mL Mouth Rinse BID  . Chlorhexidine Gluconate Cloth  6 each Topical Q0600  .  Chlorhexidine Gluconate Cloth  6 each Topical Q0600  . darbepoetin (ARANESP) injection - DIALYSIS  100 mcg Subcutaneous Q Thu-HD  . feeding supplement (NEPRO CARB STEADY)  1,000 mL Oral Q24H  . feeding supplement (PRO-STAT SUGAR FREE 64)  30 mL Per Tube TID  . guaiFENesin  10 mL Per Tube BID  . heparin  5,000 Units Subcutaneous Q8H  . insulin aspart  0-15 Units Subcutaneous Q4H  . pantoprazole sodium  40 mg Per Tube Q24H  . PARoxetine  40 mg Per Tube Daily  . QUEtiapine  25 mg Oral BID  . sodium chloride flush  10-40 mL Intracatheter Q12H  . traZODone  25 mg Per Tube QHS    have reviewed scheduled and prn medications.  Physical Exam: General: NAD, comfortable, oriented. Heart:RRR, s1s2 nl, no rubs Lungs:clear b/l, no crackle Abdomen:soft, Non-tender, non-distended Extremities: Trace LE edema Dialysis Access: Right IJ TDC.  Dron Prasad Bhandari 02/13/2020,11:25 AM  LOS: 39 days  Pager: [phone]

## 2020-02-14 DIAGNOSIS — N179 Acute kidney failure, unspecified: Secondary | ICD-10-CM | POA: Diagnosis not present

## 2020-02-14 LAB — RENAL FUNCTION PANEL
Albumin: 2.7 g/dL — ABNORMAL LOW (ref 3.5–5.0)
Anion gap: 11 (ref 5–15)
BUN: 107 mg/dL — ABNORMAL HIGH (ref 8–23)
CO2: 29 mmol/L (ref 22–32)
Calcium: 8.9 mg/dL (ref 8.9–10.3)
Chloride: 94 mmol/L — ABNORMAL LOW (ref 98–111)
Creatinine, Ser: 1.9 mg/dL — ABNORMAL HIGH (ref 0.44–1.00)
GFR calc Af Amer: 30 mL/min — ABNORMAL LOW (ref >60)
GFR calc non Af Amer: 26 mL/min — ABNORMAL LOW (ref >60)
Glucose, Bld: 158 mg/dL — ABNORMAL HIGH (ref 70–99)
Phosphorus: 4.6 mg/dL (ref 2.5–4.6)
Potassium: 4.2 mmol/L (ref 3.5–5.1)
Sodium: 134 mmol/L — ABNORMAL LOW (ref 135–145)

## 2020-02-14 LAB — GLUCOSE, CAPILLARY
Glucose-Capillary: 112 mg/dL — ABNORMAL HIGH (ref 70–99)
Glucose-Capillary: 127 mg/dL — ABNORMAL HIGH (ref 70–99)
Glucose-Capillary: 139 mg/dL — ABNORMAL HIGH (ref 70–99)
Glucose-Capillary: 140 mg/dL — ABNORMAL HIGH (ref 70–99)
Glucose-Capillary: 142 mg/dL — ABNORMAL HIGH (ref 70–99)
Glucose-Capillary: 175 mg/dL — ABNORMAL HIGH (ref 70–99)

## 2020-02-14 LAB — CBC
HCT: 27.2 % — ABNORMAL LOW (ref 36.0–46.0)
Hemoglobin: 8.1 g/dL — ABNORMAL LOW (ref 12.0–15.0)
MCH: 28.4 pg (ref 26.0–34.0)
MCHC: 29.8 g/dL — ABNORMAL LOW (ref 30.0–36.0)
MCV: 95.4 fL (ref 80.0–100.0)
Platelets: 147 10*3/uL — ABNORMAL LOW (ref 150–400)
RBC: 2.85 MIL/uL — ABNORMAL LOW (ref 3.87–5.11)
RDW: 15.8 % — ABNORMAL HIGH (ref 11.5–15.5)
WBC: 5.7 10*3/uL (ref 4.0–10.5)
nRBC: 0 % (ref 0.0–0.2)

## 2020-02-14 LAB — MAGNESIUM: Magnesium: 2.6 mg/dL — ABNORMAL HIGH (ref 1.7–2.4)

## 2020-02-14 MED ORDER — HYDROCODONE-ACETAMINOPHEN 5-325 MG PO TABS
1.0000 | ORAL_TABLET | Freq: Once | ORAL | Status: AC
  Administered 2020-02-14: 12:00:00 1 via ORAL
  Filled 2020-02-14: qty 1

## 2020-02-14 MED ORDER — HEPARIN SODIUM (PORCINE) 1000 UNIT/ML IJ SOLN
INTRAMUSCULAR | Status: AC
  Administered 2020-02-14: 16:00:00 3000 [IU]
  Filled 2020-02-14: qty 3

## 2020-02-14 MED ORDER — DIPHENHYDRAMINE HCL 50 MG/ML IJ SOLN: 25.0000 mg | Freq: Once | INTRAMUSCULAR | Status: AC

## 2020-02-14 MED ORDER — DIPHENHYDRAMINE HCL 50 MG/ML IJ SOLN
INTRAMUSCULAR | Status: AC
  Administered 2020-02-14: 13:00:00 25 mg via INTRAVENOUS
  Filled 2020-02-14: qty 1

## 2020-02-14 MED ORDER — QUETIAPINE FUMARATE 25 MG PO TABS
25.0000 mg | ORAL_TABLET | Freq: Two times a day (BID) | ORAL | Status: DC
  Administered 2020-02-14 – 2020-02-16 (×4): 25 mg via ORAL
  Filled 2020-02-14 (×5): qty 1

## 2020-02-14 MED ORDER — LORAZEPAM 2 MG/ML IJ SOLN
0.5000 mg | Freq: Once | INTRAMUSCULAR | Status: AC | PRN
  Administered 2020-02-14: 11:00:00 0.5 mg via INTRAVENOUS
  Filled 2020-02-14: qty 1

## 2020-02-14 NOTE — Progress Notes (Signed)
Palliative follow up.  Visited the patient and her daughter [name]) in hemodialysis.  Patient's color is improved from 1 week ago and her eyes are much brighter.    I spoke with her for a minute and she told me she was afraid, but she could not tell me what she was afraid of.  I asked if she was having any pain.  She replied that she was having pain in her stomach.  [name] attributes this to her Ogilvie's syndrome.  She does not seem to understand that HD can cause discomfort.  Patient asks repeatedly when am I coming off this thing - [employer] 28 min.  Patient asks again [facility] replies 26 minutes.  Patient continues to ask.  [name] states she will be here for Tuesday's dialysis but the family has no one that can be here for Thursday's dialysis.  I will plan to return to talk with [name] further when her mother is comfortable in her hospital room.  At the moment she is too nervous for [place] to leave.   Florentina Jenny, PA-C Palliative Medicine Office:  [phone]  No charge note.

## 2020-02-14 NOTE — Progress Notes (Addendum)
[facility] NEPHROLOGY PROGRESS NOTE  Assessment/ Plan: Pt is a 74 y.o. yo female with history of prolonged hospitalization at [facility] for PEA arrest, MRSA pneumonia, tracheostomy, AKI on HD transferred to [facility] where she was found to have fluid overload and metabolic acidosis.  Moved to [facility] for further evaluation.  #AKI on CKD stage IV suspected due to sepsis/ATN.  She was previously on CRRT and then later transitioned to intermittent HD when hemodynamically stable.   She was refusing dialysis therefore seen by psychiatry, palliative care and ethics consult.  Needs to have a family member with her during treatment.  Last dialysis on 4/8 with episodes of agitation. We will continue TTS schedule.  -Plan for HD today however patient is refusing to go for dialysis today.  Hopefully she will have treatment in the presence of her family member. I will discontinue daily lab check.  #Agitation, encephalopathy: Psychiatry was consulted to manage her psychosis/agitation.  She was calm and comfortable today.  #Sepsis MRSA pneumonia: Completed antibiotics.  #History of PEA arrest with chronic respiratory failure: The tracheostomy was decannulated.  Now on oxygen.  #Anemia of chronic illness: Iron acceptable.  Continue ESA.  #Hypertension: Continue amlodipine and metoprolol.  UF during HD.   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  Phosphorus at goal, continue PhosLo.  # Disposition: Difficult situation because patient is refusing dialysis.    Discussed with the primary team.  Subjective: Seen and examined at bedside.  No new event.  Patient was alert awake.  She is oriented as well.  Patient refuses to go for dialysis today.  No chest pain or shortness of breath. Objective Vital signs in last 24 hours: Vitals:   02/13/20 0350 02/13/20 1434 02/13/20 2056 02/14/20 0416  BP: (!) 154/62 (!) 136/56 (!) 143/62 (!) 148/63  Pulse: 78 76 79 84  Resp: _0 Temp: 98.4 F (36.9 C) 98.7 F (37.1  C) 98.2 F (36.8 C) 98.1 F (36.7 C)  TempSrc: Oral Oral Oral Oral  SpO2: 100% 98% 97% 95%  Weight:      Height:       Weight change:   Intake/Output Summary (Last 24 hours) at 02/14/2020 1133 Last data filed at 02/14/2020 0437 Gross per 24 hour  Intake -  Output 400 ml  Net -400 ml       Labs: Basic Metabolic Panel: Recent Labs  Lab 02/12/20 0359 02/13/20 0627 02/14/20 0852  NA 133* 137 134*  K 5.1 4.1 4.2  CL 93* 95* 94*  CO2 _1 GLUCOSE 163* 119* 158*  BUN 184* 76* 107*  CREATININE 2.62* 1.49* 1.90*  CALCIUM 8.7* 8.8* 8.9  PHOS 6.3* 4.1 4.6   Liver Function Tests: Recent Labs  Lab 02/12/20 0359 02/13/20 0627 02/14/20 0852  ALBUMIN 2.7* 2.7* 2.7*   No results for input(s): LIPASE, AMYLASE in the last 168 hours. No results for input(s): AMMONIA in the last 168 hours. CBC: Recent Labs  Lab 02/09/20 0412 02/09/20 0412 02/11/20 0454 02/12/20 1605 02/13/20 0627  WBC 4.9   < > 5.3 5.2 4.2  HGB 8.4*   < > 8.1* 7.9* 8.4*  HCT 28.7*   < > 27.0* 26.2* 28.7*  MCV 95.0  --  94.7 92.9 93.5  PLT 126*   < > 127* 122* 131*   < > = values in this interval not displayed.   Cardiac Enzymes: No results for input(s): CKTOTAL, CKMB, CKMBINDEX, TROPONINI in the last 168 hours. CBG:  Recent Labs  Lab 02/13/20 1559 02/13/20 1952 02/14/20 0008 02/14/20 0421 02/14/20 0751  GLUCAP 133* 102* 140* 127* 142*    Iron Studies: No results for input(s): IRON, TIBC, TRANSFERRIN, FERRITIN in the last 72 hours. Studies/Results: No results found.  Medications: Infusions: . sodium chloride      Scheduled Medications: . amLODipine  10 mg Oral Daily  . B-complex with vitamin C  1 tablet Per Tube Daily  . bisacodyl  10 mg Rectal Daily  . busPIRone  5 mg Per Tube BID  . calcium acetate  667 mg Oral TID WC  . camphor-menthol   Topical QID  . chlorhexidine gluconate (MEDLINE KIT)  15 mL Mouth Rinse BID  . Chlorhexidine Gluconate Cloth  6 each Topical Q0600  .  darbepoetin (ARANESP) injection - DIALYSIS  100 mcg Subcutaneous Q Thu-HD  . feeding supplement (NEPRO CARB STEADY)  1,000 mL Oral Q24H  . feeding supplement (PRO-STAT SUGAR FREE 64)  30 mL Per Tube TID  . guaiFENesin  10 mL Per Tube BID  . heparin  5,000 Units Subcutaneous Q8H  . insulin aspart  0-15 Units Subcutaneous Q4H  . pantoprazole sodium  40 mg Per Tube Q24H  . PARoxetine  40 mg Per Tube Daily  . QUEtiapine  25 mg Oral BID  . sodium chloride flush  10-40 mL Intracatheter Q12H  . traZODone  25 mg Per Tube QHS    have reviewed scheduled and prn medications.  Physical Exam: General: NAD, comfortable, oriented.  No change from yesterday. Heart:RRR, s1s2 nl, no rubs Lungs:clear b/l, no crackle Abdomen:soft, Non-tender, non-distended Extremities: Trace LE edema Dialysis Access: Right IJ TDC.  Dron Prasad Bhandari 02/14/2020,11:33 AM  LOS: 40 days  Pager: [phone]

## 2020-02-14 NOTE — Progress Notes (Signed)
PROGRESS NOTE    Bailey Cox  ZJQ:[id number] DOB: 09/02/1946 DOA: 01/05/2020 PCP: Patient, No Pcp Per   Brief Narrative:  HPI on 01/05/2020 by Mr. Georgann Housekeeper, NP (ICU) 74 year old female with PMH as below, which is significant for long admission in the [facility] system from 10/2019 all the way through 01/05/2020.  She was originally hospitalized in September 2020 with acute cholecystitis and underwent a cholecystectomy.  She was then sent to SNF for short duration but had return to [facility] to have a bile duct stent placed ultimately returning to SNF.  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  While hospitalized she developed fluid overload and was ultimately transferred to [facility] for dialysis.  Her course after that point become somewhat unclear, but as described by her family she was on and off the ventilator for several procedures and was eventually left intubated.  One of these procedures was an endoscopy during which she unfortunately suffered a cardiac arrest.  Details of the arrest are unclear.  She remained on the ventilator and ultimately underwent tracheostomy.  Her hospital course was also complicated by MRSA pneumonia.  She was ultimately able to come off of hemodialysis and was treated intermittently with diuresis.  It sounds like she bounced in and out of the ICU a few times with volume overload.  On the tail end of her admission she still struggled with kidney disease and was being treated for a urinary tract infection with cefepime and vancomycin.  Plans were to stop on 3/2.  She was discharged from the [facility] system on 3/1 to [facility] in [place].  Upon arrival to [facility] they deemed her too sick for admission and transferred her to [facility] emergency department with complaints of volume overload and acidosis.   Interim history Initially admitted by ICU this patient was ventilator dependent, into trach, which  is now been removed.  Patient started CRRT on 01/13/2020 until 01/16/2020 and transferred to the floor on 01/18/2020 as she was tolerating hemodialysis.  Nephrology has been seeing her and feels that she has AKI on CKD most likely being from ATN/sepsis.  Patient family wants aggressive measures.  CRRT was discontinued on 01/15/2020.  Patient now has [facility] and is scheduled for intermittent dialysis.  Continues to refuse to go to dialysis.  Ethics committee meeting 02/10/2020. Assessment & Plan   Acute kidney injury on chronic kidney disease, stage IV -Nephrology consulted -Initially thought to be AKI/ATN from sepsis -Family desires for continued dialysis -Now on intermittent HD.   -Psychiatry consulted for capacity, patient does not have capacity to make her own decisions -Palliative care also consulted and continues to be full code -Continues to refuse dialysis -Ethics consulted -Family plans to stay around to facilitate hemodialysis.  MRSA pneumonia -Completed antibiotics  Chronic respiratory failure -Patient did have tracheostomy however was decannulated on 01/23/2020 -Continue supportive measures -Weaned down to 2 L, currently maintaining oxygen saturations in the high 90s to 100%  Diabetes mellitus, type II -Hemoglobin A1c 5.7 -Continue insulin sliding scale CBG monitoring  Essential hypertension -Continue PRN metoprolol  Anemia of chronic kidney disease -Continue Aranesp -Monitor CBC  Hyponatremia -Resolved  Anxiety/depression Agitation -Continue Paxil, BuSpar, trazodone Significant agitation noted at hemodialysis on 02/12/2020. As needed Haldol and scheduled Seroquel.  Monitor.  GERD -Continue PPI  Goals of care -Palliative care consulted, patient remains full code -Of note palliative care discussion was had with patient was at [facility]  [place].  Previous hospitalist discussed with her daughter, and understands prognosis is guarded. -Psychiatry consulted for capacity, patient  does not have capacity to make her own decisions -Ethics consulted -Bailey Cox who is the POA at bedside tells me that she wants to continue hemodialysis and patient did not have any problems with hemodialysis when she was at [place] is to continue hemodialysis in presence of a family member with hope to transition to SNF near their home and continued to remain present for hemodialysis in future. I have informed them that currently in order to facilitate safe hemodialysis patient is requiring IV benzodiazepine, IV pain medication as well as psychotropic medications.  This can be akin to chemical restraint and outpatient hemodialysis centers will not be able to continue that regimen.  Nutritional support -She does not want to eat -currently has peg tube and on Tube feeds  DVT Prophylaxis  Heparin  Code Status: Full  Family Communication: None at bedside  Disposition Plan: Admitted from [place]. Now with renal failure needing HD, but continues to refuse. Dispo TBD. LTAC has declined patient. Disposition pending patient's ability to dialyze in a chair.  [facility] has declined transfer.   Consultants Nephrology PCCM Psychiatry Palliative care Interventional radiology Ethics  Procedures  Echcoardiogram 01/06/2020 shows an EF of 65 to 54%, grade 2 diastolic dysfunction.  RVSP.  Antibiotics   Anti-infectives (From admission, onward)   Start     Dose/Rate Route Frequency Ordered Stop   01/10/20 0930  vancomycin (VANCOCIN) IVPB 1000 mg/200 mL premix  Status:  Discontinued     1,000 mg 200 mL/hr over 60 Minutes Intravenous  Once 01/10/20 0915 01/13/20 0623   01/06/20 2200  ceFEPIme (MAXIPIME) 2 g in sodium chloride 0.9 % 100 mL IVPB     2 g 200 mL/hr over 30 Minutes Intravenous Every 24 hours 01/05/20 2251 01/11/20 2234   01/05/20 2200  ceFEPIme (MAXIPIME) 2 g in sodium chloride 0.9 % 100 mL IVPB     2 g 200 mL/hr over 30 Minutes Intravenous  Once 01/05/20 2148 01/06/20 0015    01/05/20 2200  metroNIDAZOLE (FLAGYL) IVPB 500 mg     500 mg 100 mL/hr over 60 Minutes Intravenous  Once 01/05/20 2148 01/06/20 0015   01/05/20 2200  vancomycin (VANCOCIN) IVPB 1000 mg/200 mL premix  Status:  Discontinued     1,000 mg 200 mL/hr over 60 Minutes Intravenous  Once 01/05/20 2148 01/05/20 2151   01/05/20 2200  vancomycin (VANCOREADY) IVPB 2000 mg/400 mL  Status:  Discontinued     2,000 mg 200 mL/hr over 120 Minutes Intravenous  Once 01/05/20 2151 01/05/20 2223     Subjective:   Bailey Cox seen and examined today.  Patient was in her room with her eyes closed.  Multiple times patient is shouting help me help me please help me.  On further questioning patient tells me that she has pain in her shoulders.  After getting reassurance that she will get some pain medication for the pain within few minutes she starts shopping again for help me help me.  Shouting improves with daughter Bailey Cox at bedside. No p.o. intake.  Objective:   Vitals:   02/14/20 1500 02/14/20 1530 02/14/20 1532 02/14/20 1650  BP: 131/63 123/62 (!) 128/58 (!) 150/66  Pulse: 71 70 70 72  Resp:   20 18  Temp:   98.6 F (37 C) 98.5 F (36.9 C)  TempSrc:   Oral Oral  SpO2:   99% 100%  Weight:   82.4 kg   Height:        Intake/Output Summary (Last 24 hours) at 02/14/2020 1907 Last data filed at 02/14/2020 1532 Gross per 24 hour  Intake -  Output 3000 ml  Net -3000 ml   Filed Weights   02/12/20 1101 02/14/20 1155 02/14/20 1532  Weight: 83.4 kg 84.7 kg 82.4 kg   General: alert and oriented to place and person. Appear in moderate distress, affect emotionally labile Eyes: PERRL, Conjunctiva normal ENT: Oral Mucosa Clear, moist  Neck: difficult to assess  JVD, no Abnormal Mass Or lumps Cardiovascular: S1 and S2 Present, no Murmur,  Respiratory: good respiratory effort, Bilateral Air entry equal and Decreased, no signs of accessory muscle use, Clear to Auscultation, no Crackles, no wheezes Abdomen:  Bowel Sound present, Soft and mildly distended, no tenderness, no hernia Skin: no rashes  Extremities: no Pedal edema, no calf tenderness Neurologic: without any new focal findings  Gait not checked due to patient safety concerns   Data Reviewed: I have personally reviewed following labs and imaging studies  CBC: Recent Labs  Lab 02/09/20 0412 02/11/20 0454 02/12/20 1605 02/13/20 0627 02/14/20 1209  WBC 4.9 5.3 5.2 4.2 5.7  HGB 8.4* 8.1* 7.9* 8.4* 8.1*  HCT 28.7* 27.0* 26.2* 28.7* 27.2*  MCV 95.0 94.7 92.9 93.5 95.4  PLT 126* 127* 122* 131* 327*   Basic Metabolic Panel: Recent Labs  Lab 02/10/20 0416 02/11/20 0453 02/11/20 0454 02/12/20 0359 02/13/20 0627 02/14/20 0852  NA 134* 132*  --  133* 137 134*  K 4.8 4.6  --  5.1 4.1 4.2  CL 94* 93*  --  93* 95* 94*  CO2 29 29  --  '28 30 29  '$ GLUCOSE 133* 129*  --  163* 119* 158*  BUN 165* 175*  --  184* 76* 107*  CREATININE 2.40* 2.45*  --  2.62* 1.49* 1.90*  CALCIUM 8.8* 8.9  --  8.7* 8.8* 8.9  MG 2.7*  --  2.7* 2.8* 2.3 2.6*  PHOS 5.6* 6.2*  --  6.3* 4.1 4.6   GFR: Estimated Creatinine Clearance: 28.2 mL/min (A) (by C-G formula based on SCr of 1.9 mg/dL (H)). Liver Function Tests: Recent Labs  Lab 02/10/20 0416 02/11/20 0453 02/12/20 0359 02/13/20 0627 02/14/20 0852  ALBUMIN 2.6* 2.6* 2.7* 2.7* 2.7*   No results for input(s): LIPASE, AMYLASE in the last 168 hours. No results for input(s): AMMONIA in the last 168 hours. Coagulation Profile: No results for input(s): INR, PROTIME in the last 168 hours. Cardiac Enzymes: No results for input(s): CKTOTAL, CKMB, CKMBINDEX, TROPONINI in the last 168 hours. BNP (last 3 results) No results for input(s): PROBNP in the last 8760 hours. HbA1C: No results for input(s): HGBA1C in the last 72 hours. CBG: Recent Labs  Lab 02/14/20 0008 02/14/20 0421 02/14/20 0751 02/14/20 1135 02/14/20 1647  GLUCAP 140* 127* 142* 139* 112*   Lipid Profile: No results for input(s):  CHOL, HDL, LDLCALC, TRIG, CHOLHDL, LDLDIRECT in the last 72 hours. Thyroid Function Tests: No results for input(s): TSH, T4TOTAL, FREET4, T3FREE, THYROIDAB in the last 72 hours. Anemia Panel: No results for input(s): VITAMINB12, FOLATE, FERRITIN, TIBC, IRON, RETICCTPCT in the last 72 hours. Urine analysis:    Component Value Date/Time   COLORURINE AMBER (A) 01/13/2020 1302   APPEARANCEUR TURBID (A) 01/13/2020 1302   LABSPEC 1.018 01/13/2020 1302   PHURINE 5.0 01/13/2020 1302   GLUCOSEU NEGATIVE 01/13/2020 1302   HGBUR SMALL (A) 01/13/2020 1302  BILIRUBINUR NEGATIVE 01/13/2020 1302   KETONESUR NEGATIVE 01/13/2020 1302   PROTEINUR >=300 (A) 01/13/2020 1302   NITRITE NEGATIVE 01/13/2020 1302   LEUKOCYTESUR LARGE (A) 01/13/2020 1302   Sepsis Labs: '@LABRCNTIP'$ (procalcitonin:4,lacticidven:4)  )No results found for this or any previous visit (from the past 240 hour(s)).    Radiology Studies: No results found.   Scheduled Meds: . amLODipine  10 mg Oral Daily  . B-complex with vitamin C  1 tablet Per Tube Daily  . bisacodyl  10 mg Rectal Daily  . busPIRone  5 mg Per Tube BID  . calcium acetate  667 mg Oral TID WC  . camphor-menthol   Topical QID  . chlorhexidine gluconate (MEDLINE KIT)  15 mL Mouth Rinse BID  . Chlorhexidine Gluconate Cloth  6 each Topical Q0600  . darbepoetin (ARANESP) injection - DIALYSIS  100 mcg Subcutaneous Q Thu-HD  . feeding supplement (NEPRO CARB STEADY)  1,000 mL Oral Q24H  . feeding supplement (PRO-STAT SUGAR FREE 64)  30 mL Per Tube TID  . guaiFENesin  10 mL Per Tube BID  . heparin  5,000 Units Subcutaneous Q8H  . insulin aspart  0-15 Units Subcutaneous Q4H  . pantoprazole sodium  40 mg Per Tube Q24H  . PARoxetine  40 mg Per Tube Daily  . QUEtiapine  25 mg Oral BID  . sodium chloride flush  10-40 mL Intracatheter Q12H  . traZODone  25 mg Per Tube QHS   Continuous Infusions: . sodium chloride       LOS: 40 days   Time Spent in minutes   30  minutes  Author:  Berle Mull, MD [facility] 02/14/2020  7:07 PM   To reach On-call, see care teams to locate the attending and reach out to them via [email or web address]. If 7PM-7AM, please contact night-coverage If you still have difficulty reaching the attending provider, please page the [facility] (Director on Call) for [facility] on amion for assistance.

## 2020-02-15 DIAGNOSIS — N179 Acute kidney failure, unspecified: Secondary | ICD-10-CM | POA: Diagnosis not present

## 2020-02-15 LAB — GLUCOSE, CAPILLARY
Glucose-Capillary: 107 mg/dL — ABNORMAL HIGH (ref 70–99)
Glucose-Capillary: 129 mg/dL — ABNORMAL HIGH (ref 70–99)
Glucose-Capillary: 136 mg/dL — ABNORMAL HIGH (ref 70–99)
Glucose-Capillary: 139 mg/dL — ABNORMAL HIGH (ref 70–99)
Glucose-Capillary: 143 mg/dL — ABNORMAL HIGH (ref 70–99)
Glucose-Capillary: 155 mg/dL — ABNORMAL HIGH (ref 70–99)
Glucose-Capillary: 94 mg/dL (ref 70–99)

## 2020-02-15 NOTE — Progress Notes (Signed)
[facility] NEPHROLOG[facility] PROGRESS NOTE  Assessment/ Plan: Pt is a 74 y.o. yo female with history of prolonged hospitalization at [facility] for PEA arrest, MRSA pneumonia, tracheostomy, AKI on HD transferred to [facility] where she was found to have fluid overload and metabolic acidosis.  Moved to [facility] for further evaluation.  #AKI on CKD stage IV suspected due to sepsis/ATN.  She was previously on CRRT and then later transitioned to intermittent HD when hemodynamically stable.   She was refusing dialysis therefore seen by psychiatry, palliative care and ethics consult.  Needs to have a family member with her during treatment.  Last dialysis on 4/10 with episodes of agitation, has been requiring chemical restraint.  We will continue TTS schedule. Next HD on 4/13.  Discussed with the patient's daughter at bedside today.  Awaiting SNF near home.  #Agitation, encephalopathy: Psychiatry was consulted to manage her psychosis/agitation.  She was calm and comfortable today.  #Sepsis MRSA pneumonia: Completed antibiotics.  #History of PEA arrest with chronic respiratory failure: The tracheostomy was decannulated.  Now on oxygen.  #Anemia of chronic illness: Iron acceptable.  Continue ESA.  #Hypertension: Continue amlodipine and metoprolol.  UF during HD.   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  Phosphorus at goal, continue PhosLo.  # Disposition: Difficult situation because patient is refusing dialysis.    Discussed with the primary team.  Subjective: Seen and examined at bedside.  HD yesterday required IV Benadryl and was in the presence of family member.  UF 3 L.  She looks alert awake and oriented x3.  Very pleasant.  Her daughter at bedside. Objective Vital signs in last 24 hours: Vitals:   02/14/20 1650 02/14/20 2009 02/15/20 0340 02/15/20 0458  BP: (!) 150/66 (!) 143/57 (!) 142/59   Pulse: 72 76 76   Resp: '18 17 17   '$ Temp: 98.5 F (36.9 C) 98.4 F (36.9 C) 98 F (36.7 C)   TempSrc: Oral  Oral Oral   SpO2: 100% 100% 99%   Weight:    82.8 kg  Height:       Weight change:   Intake/Output Summary (Last 24 hours) at 02/15/2020 1106 Last data filed at 02/15/2020 0900 Gross per 24 hour  Intake 120 ml  Output 3600 ml  Net -3480 ml       Labs: Basic Metabolic Panel: Recent Labs  Lab 02/12/20 0359 02/13/20 0627 02/14/20 0852  NA 133* 137 134*  K 5.1 4.1 4.2  CL 93* 95* 94*  CO2 '28 30 29  '$ GLUCOSE 163* 119* 158*  BUN 184* 76* 107*  CREATININE 2.62* 1.49* 1.90*  CALCIUM 8.7* 8.8* 8.9  PHOS 6.3* 4.1 4.6   Liver Function Tests: Recent Labs  Lab 02/12/20 0359 02/13/20 0627 02/14/20 0852  ALBUMIN 2.7* 2.7* 2.7*   No results for input(s): LIPASE, AMYLASE in the last 168 hours. No results for input(s): AMMONIA in the last 168 hours. CBC: Recent Labs  Lab 02/09/20 0412 02/09/20 0412 02/11/20 0454 02/11/20 0454 02/12/20 1605 02/13/20 0627 02/14/20 1209  WBC 4.9   < > 5.3   < > 5.2 4.2 5.7  HGB 8.4*   < > 8.1*   < > 7.9* 8.4* 8.1*  HCT 28.7*   < > 27.0*   < > 26.2* 28.7* 27.2*  MCV 95.0  --  94.7  --  92.9 93.5 95.4  PLT 126*   < > 127*   < > 122* 131* 147*   < > = values in this interval  not displayed.   Cardiac Enzymes: No results for input(s): CKTOTAL, CKMB, CKMBINDEX, TROPONINI in the last 168 hours. CBG: Recent Labs  Lab 02/14/20 1647 02/14/20 2007 02/15/20 0022 02/15/20 0337 02/15/20 0800  GLUCAP 112* 175* 136* 94 143*    Iron Studies: No results for input(s): IRON, TIBC, TRANSFERRIN, FERRITIN in the last 72 hours. Studies/Results: No results found.  Medications: Infusions: . sodium chloride      Scheduled Medications: . amLODipine  10 mg Oral Daily  . B-complex with vitamin C  1 tablet Per Tube Daily  . bisacodyl  10 mg Rectal Daily  . busPIRone  5 mg Per Tube BID  . calcium acetate  667 mg Oral TID WC  . camphor-menthol   Topical QID  . chlorhexidine gluconate (MEDLINE KIT)  15 mL Mouth Rinse BID  . Chlorhexidine Gluconate  Cloth  6 each Topical Q0600  . darbepoetin (ARANESP) injection - DIALYSIS  100 mcg Subcutaneous Q Thu-HD  . feeding supplement (NEPRO CARB STEADY)  1,000 mL Oral Q24H  . feeding supplement (PRO-STAT SUGAR FREE 64)  30 mL Per Tube TID  . guaiFENesin  10 mL Per Tube BID  . heparin  5,000 Units Subcutaneous Q8H  . insulin aspart  0-15 Units Subcutaneous Q4H  . pantoprazole sodium  40 mg Per Tube Q24H  . PARoxetine  40 mg Per Tube Daily  . QUEtiapine  25 mg Oral BID  . sodium chloride flush  10-40 mL Intracatheter Q12H  . traZODone  25 mg Per Tube QHS    have reviewed scheduled and prn medications.  Physical Exam: General: Lying on bed comfortable, not in distress. Heart:RRR, s1s2 nl, no rubs Lungs:clear b/l, no crackle Abdomen:soft, Non-tender, non-distended Extremities: Trace LE edema Dialysis Access: Right IJ TDC.  Bailey Cox Bailey Cox 02/15/2020,11:06 AM  LOS: 41 days  Pager: [phone]

## 2020-02-15 NOTE — Progress Notes (Signed)
PROGRESS NOTE    Bailey Cox  DZH:[id number] DOB: 1946-05-01 DOA: 01/05/2020 PCP: Patient, No Pcp Per   Brief Narrative:  HPI on 01/05/2020 by Mr. Georgann Housekeeper, NP (ICU) 74 year old female with PMH as below, which is significant for long admission in the [facility] system from 10/2019 all the way through 01/05/2020.  She was originally hospitalized in September 2020 with acute cholecystitis and underwent a cholecystectomy.  She was then sent to SNF for short duration but had return to [facility] to have a bile duct stent placed ultimately returning to SNF.  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  While hospitalized she developed fluid overload and was ultimately transferred to [facility] for dialysis.  Her course after that point become somewhat unclear, but as described by her family she was on and off the ventilator for several procedures and was eventually left intubated.  One of these procedures was an endoscopy during which she unfortunately suffered a cardiac arrest.  Details of the arrest are unclear.  She remained on the ventilator and ultimately underwent tracheostomy.  Her hospital course was also complicated by MRSA pneumonia.  She was ultimately able to come off of hemodialysis and was treated intermittently with diuresis.  It sounds like she bounced in and out of the ICU a few times with volume overload.  On the tail end of her admission she still struggled with kidney disease and was being treated for a urinary tract infection with cefepime and vancomycin.  Plans were to stop on 3/2.  She was discharged from the [facility] system on 3/1 to [facility] in [place].  Upon arrival to [facility] they deemed her too sick for admission and transferred her to [facility] emergency department with complaints of volume overload and acidosis.   Interim history Initially admitted by ICU this patient was ventilator dependent, into trach, which  is now been removed.  Patient started CRRT on 01/13/2020 until 01/16/2020 and transferred to the floor on 01/18/2020 as she was tolerating hemodialysis.  Nephrology has been seeing her and feels that she has AKI on CKD most likely being from ATN/sepsis.  Patient family wants aggressive measures.  CRRT was discontinued on 01/15/2020.  Patient now has [facility] and is scheduled for intermittent dialysis.  Continues to refuse to go to dialysis.  Ethics committee meeting 72/04/2020. Assessment & Plan   Acute kidney injury on chronic kidney disease, stage IV -Nephrology consulted -Initially thought to be AKI/ATN from sepsis -Family desires for continued dialysis -Now on intermittent HD.   -Psychiatry consulted for capacity, patient does not have capacity to make her own decisions -Palliative care also consulted and continues to be full code -Continues to refuse dialysis -Ethics consulted -Family plans to stay around to facilitate hemodialysis.  MRSA pneumonia -Completed antibiotics  Chronic respiratory failure -Patient did have tracheostomy however was decannulated on 01/23/2020 -Continue supportive measures -Weaned down to 2 L, currently maintaining oxygen saturations in the high 90s to 100%  Diabetes mellitus, type II -Hemoglobin A1c 5.7 -Continue insulin sliding scale CBG monitoring  Essential hypertension -Continue PRN metoprolol  Anemia of chronic kidney disease -Continue Aranesp -Monitor CBC  Hyponatremia -Resolved  Anxiety/depression Agitation -Continue Paxil, BuSpar, trazodone Significant agitation noted at hemodialysis on 02/12/2020. As needed Haldol and scheduled Seroquel.  Monitor.  GERD -Continue PPI  Goals of care -Palliative care consulted, patient remains full code -Of note palliative care discussion was had with patient was at [facility]  [place].  Previous hospitalist discussed with her daughter, and understands prognosis is guarded. -Psychiatry consulted for capacity, patient  does not have capacity to make her own decisions -Ethics consulted -[name] who is the POA at bedside tells me that she wants to continue hemodialysis and patient did not have any problems with hemodialysis when she was at [facility] is to continue hemodialysis in presence of a family member with hope to transition to SNF near their home and continued to remain present for hemodialysis in future. I have informed them that currently in order to facilitate safe hemodialysis patient is requiring IV benzodiazepine, IV pain medication as well as psychotropic medications.  This can be akin to chemical restraint and outpatient hemodialysis centers will not be able to continue that regimen.  Nutritional support -She does not want to eat -currently has peg tube and on Tube feeds  DVT Prophylaxis  Heparin  Code Status: Full  Family Communication: Daughter at bedside. All questions answered.  Disposition Plan: Admitted from [facility]. Now with renal failure needing HD, but continues to refuse. Dispo TBD. LTAC has declined patient. Disposition pending patient's ability to dialyze in a chair.  [place] has declined transfer.   Consultants Nephrology PCCM Psychiatry Palliative care Interventional radiology Ethics  Procedures  Echcoardiogram 01/06/2020 shows an EF of 65 to 81%, grade 2 diastolic dysfunction.  RVSP.  Antibiotics   Anti-infectives (From admission, onward)   Start     Dose/Rate Route Frequency Ordered Stop   01/10/20 0930  vancomycin (VANCOCIN) IVPB 1000 mg/200 mL premix  Status:  Discontinued     1,000 mg 200 mL/hr over 60 Minutes Intravenous  Once 01/10/20 0915 01/13/20 0623   01/06/20 2200  ceFEPIme (MAXIPIME) 2 g in sodium chloride 0.9 % 100 mL IVPB     2 g 200 mL/hr over 30 Minutes Intravenous Every 24 hours 01/05/20 2251 01/11/20 2234   01/05/20 2200  ceFEPIme (MAXIPIME) 2 g in sodium chloride 0.9 % 100 mL IVPB     2 g 200 mL/hr over 30 Minutes Intravenous  Once  01/05/20 2148 01/06/20 0015   01/05/20 2200  metroNIDAZOLE (FLAGYL) IVPB 500 mg     500 mg 100 mL/hr over 60 Minutes Intravenous  Once 01/05/20 2148 01/06/20 0015   01/05/20 2200  vancomycin (VANCOCIN) IVPB 1000 mg/200 mL premix  Status:  Discontinued     1,000 mg 200 mL/hr over 60 Minutes Intravenous  Once 01/05/20 2148 01/05/20 2151   01/05/20 2200  vancomycin (VANCOREADY) IVPB 2000 mg/400 mL  Status:  Discontinued     2,000 mg 200 mL/hr over 120 Minutes Intravenous  Once 01/05/20 2151 01/05/20 2223     Subjective:   Bailey Cox seen and examined today. No acute complaints. No acute events. Patient calm and comfortable while the family is at bedside.  Objective:   Vitals:   02/14/20 2009 02/15/20 0340 02/15/20 0458 02/15/20 1228  BP: (!) 143/57 (!) 142/59  (!) 131/57  Pulse: 76 76  81  Resp: '17 17  19  '$ Temp: 98.4 F (36.9 C) 98 F (36.7 C)  98.4 F (36.9 C)  TempSrc: Oral Oral  Oral  SpO2: 100% 99%  99%  Weight:   82.8 kg   Height:        Intake/Output Summary (Last 24 hours) at 02/15/2020 1806 Last data filed at 02/15/2020 1300 Gross per 24 hour  Intake 240 ml  Output 600 ml  Net -360 ml   [employer]  02/14/20 1155 02/14/20 1532 02/15/20 0458  Weight: 84.7 kg 82.4 kg 82.8 kg   General: alert and oriented to place and person. Appear in moderate distress, affect emotionally labile Eyes: PERRL, Conjunctiva normal ENT: Oral Mucosa Clear, moist  Neck: difficult to assess  JVD, no Abnormal Mass Or lumps Cardiovascular: S1 and S2 Present, no Murmur,  Respiratory: good respiratory effort, Bilateral Air entry equal and Decreased, no signs of accessory muscle use, Clear to Auscultation, no Crackles, no wheezes Abdomen: Bowel Sound present, Soft and mildly distended, no tenderness, no hernia Skin: no rashes  Extremities: no Pedal edema, no calf tenderness Neurologic: without any new focal findings  Gait not checked due to patient safety concerns   Data Reviewed:  I have personally reviewed following labs and imaging studies  CBC: Recent Labs  Lab 02/09/20 0412 02/11/20 0454 02/12/20 1605 02/13/20 0627 02/14/20 1209  WBC 4.9 5.3 5.2 4.2 5.7  HGB 8.4* 8.1* 7.9* 8.4* 8.1*  HCT 28.7* 27.0* 26.2* 28.7* 27.2*  MCV 95.0 94.7 92.9 93.5 95.4  PLT 126* 127* 122* 131* 569*   Basic Metabolic Panel: Recent Labs  Lab 02/10/20 0416 02/11/20 0453 02/11/20 0454 02/12/20 0359 02/13/20 0627 02/14/20 0852  NA 134* 132*  --  133* 137 134*  K 4.8 4.6  --  5.1 4.1 4.2  CL 94* 93*  --  93* 95* 94*  CO2 29 29  --  '28 30 29  '$ GLUCOSE 133* 129*  --  163* 119* 158*  BUN 165* 175*  --  184* 76* 107*  CREATININE 2.40* 2.45*  --  2.62* 1.49* 1.90*  CALCIUM 8.8* 8.9  --  8.7* 8.8* 8.9  MG 2.7*  --  2.7* 2.8* 2.3 2.6*  PHOS 5.6* 6.2*  --  6.3* 4.1 4.6   GFR: Estimated Creatinine Clearance: 28.3 mL/min (A) (by C-G formula based on SCr of 1.9 mg/dL (H)). Liver Function Tests: Recent Labs  Lab 02/10/20 0416 02/11/20 0453 02/12/20 0359 02/13/20 0627 02/14/20 0852  ALBUMIN 2.6* 2.6* 2.7* 2.7* 2.7*   No results for input(s): LIPASE, AMYLASE in the last 168 hours. No results for input(s): AMMONIA in the last 168 hours. Coagulation Profile: No results for input(s): INR, PROTIME in the last 168 hours. Cardiac Enzymes: No results for input(s): CKTOTAL, CKMB, CKMBINDEX, TROPONINI in the last 168 hours. BNP (last 3 results) No results for input(s): PROBNP in the last 8760 hours. HbA1C: No results for input(s): HGBA1C in the last 72 hours. CBG: Recent Labs  Lab 02/15/20 0022 02/15/20 0337 02/15/20 0800 02/15/20 1225 02/15/20 1654  GLUCAP 136* 94 143* 155* 107*   Lipid Profile: No results for input(s): CHOL, HDL, LDLCALC, TRIG, CHOLHDL, LDLDIRECT in the last 72 hours. Thyroid Function Tests: No results for input(s): TSH, T4TOTAL, FREET4, T3FREE, THYROIDAB in the last 72 hours. Anemia Panel: No results for input(s): VITAMINB12, FOLATE, FERRITIN, TIBC,  IRON, RETICCTPCT in the last 72 hours. Urine analysis:    Component Value Date/Time   COLORURINE AMBER (A) 01/13/2020 1302   APPEARANCEUR TURBID (A) 01/13/2020 1302   LABSPEC 1.018 01/13/2020 1302   PHURINE 5.0 01/13/2020 1302   GLUCOSEU NEGATIVE 01/13/2020 1302   HGBUR SMALL (A) 01/13/2020 1302   BILIRUBINUR NEGATIVE 01/13/2020 1302   KETONESUR NEGATIVE 01/13/2020 1302   PROTEINUR >=300 (A) 01/13/2020 1302   NITRITE NEGATIVE 01/13/2020 1302   LEUKOCYTESUR LARGE (A) 01/13/2020 1302   Radiology Studies: No results found.  Scheduled Meds: . amLODipine  10 mg Oral Daily  .  B-complex with vitamin C  1 tablet Per Tube Daily  . bisacodyl  10 mg Rectal Daily  . busPIRone  5 mg Per Tube BID  . calcium acetate  667 mg Oral TID WC  . camphor-menthol   Topical QID  . chlorhexidine gluconate (MEDLINE KIT)  15 mL Mouth Rinse BID  . Chlorhexidine Gluconate Cloth  6 each Topical Q0600  . darbepoetin (ARANESP) injection - DIALYSIS  100 mcg Subcutaneous Q Thu-HD  . feeding supplement (NEPRO CARB STEADY)  1,000 mL Oral Q24H  . feeding supplement (PRO-STAT SUGAR FREE 64)  30 mL Per Tube TID  . guaiFENesin  10 mL Per Tube BID  . heparin  5,000 Units Subcutaneous Q8H  . insulin aspart  0-15 Units Subcutaneous Q4H  . pantoprazole sodium  40 mg Per Tube Q24H  . PARoxetine  40 mg Per Tube Daily  . QUEtiapine  25 mg Oral BID  . sodium chloride flush  10-40 mL Intracatheter Q12H  . traZODone  25 mg Per Tube QHS   Continuous Infusions: . sodium chloride       LOS: 41 days   Time Spent in minutes   30 minutes  Author:  Berle Mull, MD [facility] 02/15/2020  6:06 PM   To reach On-call, see care teams to locate the attending and reach out to them via [email or web address]. If 7PM-7AM, please contact night-coverage If you still have difficulty reaching the attending provider, please page the [facility] (Director on Call) for [facility] on amion for assistance.

## 2020-02-16 ENCOUNTER — Inpatient Hospital Stay (HOSPITAL_COMMUNITY): Payer: Medicare Other

## 2020-02-16 DIAGNOSIS — R609 Edema, unspecified: Secondary | ICD-10-CM

## 2020-02-16 LAB — CBC
HCT: 34.1 % — ABNORMAL LOW (ref 36.0–46.0)
Hemoglobin: 9.8 g/dL — ABNORMAL LOW (ref 12.0–15.0)
MCH: 27.4 pg (ref 26.0–34.0)
MCHC: 28.7 g/dL — ABNORMAL LOW (ref 30.0–36.0)
MCV: 95.3 fL (ref 80.0–100.0)
Platelets: 161 10*3/uL (ref 150–400)
RBC: 3.58 MIL/uL — ABNORMAL LOW (ref 3.87–5.11)
RDW: 16.3 % — ABNORMAL HIGH (ref 11.5–15.5)
WBC: 12.9 10*3/uL — ABNORMAL HIGH (ref 4.0–10.5)
nRBC: 0 % (ref 0.0–0.2)

## 2020-02-16 LAB — BASIC METABOLIC PANEL
Anion gap: 14 (ref 5–15)
BUN: 85 mg/dL — ABNORMAL HIGH (ref 8–23)
CO2: 26 mmol/L (ref 22–32)
Calcium: 9.3 mg/dL (ref 8.9–10.3)
Chloride: 95 mmol/L — ABNORMAL LOW (ref 98–111)
Creatinine, Ser: 1.87 mg/dL — ABNORMAL HIGH (ref 0.44–1.00)
GFR calc Af Amer: 31 mL/min — ABNORMAL LOW (ref >60)
GFR calc non Af Amer: 27 mL/min — ABNORMAL LOW (ref >60)
Glucose, Bld: 188 mg/dL — ABNORMAL HIGH (ref 70–99)
Potassium: 4.2 mmol/L (ref 3.5–5.1)
Sodium: 135 mmol/L (ref 135–145)

## 2020-02-16 LAB — PROCALCITONIN: Procalcitonin: 0.13 ng/mL

## 2020-02-16 LAB — GLUCOSE, CAPILLARY
Glucose-Capillary: 122 mg/dL — ABNORMAL HIGH (ref 70–99)
Glucose-Capillary: 118 mg/dL — ABNORMAL HIGH (ref 70–99)
Glucose-Capillary: 179 mg/dL — ABNORMAL HIGH (ref 70–99)
Glucose-Capillary: 183 mg/dL — ABNORMAL HIGH (ref 70–99)
Glucose-Capillary: 106 mg/dL — ABNORMAL HIGH (ref 70–99)

## 2020-02-16 MED ORDER — PHENOL 1.4 % MT LIQD: 1.0000 | OROMUCOSAL | Status: DC | PRN

## 2020-02-16 MED ORDER — PROMETHAZINE HCL 25 MG/ML IJ SOLN
12.5000 mg | Freq: Four times a day (QID) | INTRAMUSCULAR | Status: DC | PRN
  Administered 2020-02-16 – 2020-02-19 (×2): 12.5 mg via INTRAVENOUS
  Filled 2020-02-16 (×2): qty 1

## 2020-02-16 MED ORDER — ACETAMINOPHEN 160 MG/5ML PO SOLN
650.0000 mg | Freq: Four times a day (QID) | ORAL | Status: DC | PRN
  Administered 2020-02-16 – 2020-02-18 (×2): 650 mg via ORAL
  Filled 2020-02-16 (×2): qty 20.3

## 2020-02-16 MED ORDER — SODIUM CHLORIDE 0.9 % IV SOLN
2.0000 g | INTRAVENOUS | Status: AC
  Administered 2020-02-16 – 2020-02-20 (×5): 2 g via INTRAVENOUS
  Filled 2020-02-16 (×5): qty 2

## 2020-02-16 MED ORDER — QUETIAPINE FUMARATE 25 MG PO TABS
25.0000 mg | ORAL_TABLET | Freq: Two times a day (BID) | ORAL | Status: DC
  Administered 2020-02-16 – 2020-02-22 (×12): 25 mg
  Filled 2020-02-16 (×11): qty 1

## 2020-02-16 MED ORDER — SODIUM CHLORIDE 0.9 % IV SOLN
500.0000 mg | INTRAVENOUS | Status: AC
  Administered 2020-02-16 – 2020-02-20 (×5): 500 mg via INTRAVENOUS
  Filled 2020-02-16 (×5): qty 500

## 2020-02-16 MED ORDER — PANTOPRAZOLE SODIUM 40 MG IV SOLR
40.0000 mg | Freq: Two times a day (BID) | INTRAVENOUS | Status: DC
  Administered 2020-02-16 (×2): 40 mg via INTRAVENOUS
  Filled 2020-02-16 (×2): qty 40

## 2020-02-16 MED ORDER — METOCLOPRAMIDE HCL 5 MG/ML IJ SOLN
10.0000 mg | Freq: Once | INTRAMUSCULAR | Status: AC
  Administered 2020-02-16: 13:00:00 10 mg via INTRAVENOUS
  Filled 2020-02-16: qty 2

## 2020-02-16 MED ORDER — SODIUM CHLORIDE 0.9 % IV BOLUS
250.0000 mL | Freq: Once | INTRAVENOUS | Status: AC
  Administered 2020-02-16: 23:00:00 250 mL via INTRAVENOUS

## 2020-02-16 MED ORDER — GUAIFENESIN-CODEINE 100-10 MG/5ML PO SOLN
5.0000 mL | Freq: Four times a day (QID) | ORAL | Status: DC | PRN
  Administered 2020-02-18 – 2020-02-22 (×3): 5 mL via ORAL
  Filled 2020-02-16 (×3): qty 5

## 2020-02-16 NOTE — Progress Notes (Signed)
Plan to transfer to [facility].

## 2020-02-16 NOTE — Progress Notes (Signed)
Cxr results are in, Dr. Posey Pronto notified, concerning for PNA.  Pt assisted up in bed, O2 up to 4L, continuous pulse ox on and sats are 88-91 on 4L.

## 2020-02-16 NOTE — Progress Notes (Signed)
Pt congested , especially left upper lobe, coughing to the point of emesis.  Dr. Posey Pronto notified and CXR ordered.

## 2020-02-16 NOTE — Progress Notes (Signed)
Report given to Philippa Sicks, RN for [facility] where pt is to be transferred.  [facility] has been with pt and Dr. Posey Pronto has been here to see the pt as well.  Dr. Posey Pronto has spoken with family and informed them that pt is going to transfer to [facility].

## 2020-02-16 NOTE — Progress Notes (Signed)
Pt taking off her oxygen.  She states she does not want it anymore, states she wants to go home.  Sats 87 on 5L at present.

## 2020-02-16 NOTE — Progress Notes (Signed)
Rounded back on pt at 15:45. Transitioned pt to a salter [place] at 10L, oxygen saturation now 94%. NTS performed with successful removal of pink tinged, thin secretions. Pt breathing still sounds congested. Congestion sounds to be upper airway. Pt still unable to cough up secretions. Cough and deep breathing encouraged. Pt not in distress, appears more fatigued than she was at my assessment this morning. Pt continues to call out for staff, able to communicate in full sentences. Transfer order written for [facility]. Awaiting bed assignment.   RN to notify RR RN for further needs.

## 2020-02-16 NOTE — Progress Notes (Signed)
Patient ID: Bailey Cox, female   DOB: [birth year], 74 y.o.   MRN: [id number] [facility] Progress Note   Assessment/ Plan:   1. Acute kidney Injury on chronic kidney disease stage IV: Suspected acute kidney injury is from sepsis/ATN without any evidence of renal recovery to date.  Previously on CRRT beginning 3/7 and later transitioned to intermittent hemodialysis after hemodynamic stability established.  She unfortunately has had a difficult course since then due to refusal of dialysis and her last hemodialysis was on Saturday 4/10 with chemical restraints; the plan is to undertake hemodialysis again today or tomorrow to help with volume unloading and try and improve her respiratory status.  Chest x-ray will be beneficial in additional evaluation and will be ordered.  Awaiting placement to skilled nursing facility. 2.  Sepsis/MRSA pneumonia: Status post completion of antibiotics; will check chest x-ray to try and tease out whether this is volume overload versus resolving pneumonia causing her cough. 3.  History of PEA arrest status post tracheostomy; decannulated on 3/19 and fortunately maintained decent oxygenation even after suffering MRSA pneumonia. 4.  Anemia: Secondary to chronic illness exacerbated by recent hospitalization-adequate iron stores and now on ESA. 5.  Disposition: Difficult situation, appreciate input from palliative care service and will continue to provide dialysis/care.  Subjective:   She complains of her arm hurting and has been having a cough "that won't go away".   Objective:   BP (!) 138/58 (BP Location: Left Arm)   Pulse 66   Temp 98 F (36.7 C) (Oral)   Resp 16   Ht '5\' 4"'$  (1.626 m)   Wt 83.4 kg   SpO2 100%   BMI 31.56 kg/m   Intake/Output Summary (Last 24 hours) at 02/16/2020 1040 Last data filed at 02/16/2020 0900 Gross per 24 hour  Intake 120 ml  Output 200 ml  Net -80 ml   Weight change: -1.3 kg  Physical Exam: Gen: Appears  comfortable resting in bed CVS: Pulse regular rhythm, normal rate, S1 and S2 normal.  Tracheostomy site intact dressing. Resp: Coarse/transmitted breath sounds bilaterally, right IJ TDC. Abd: Soft, obese, nontender Ext: Trace - 1+ lower extremity edema  Imaging: No results found.  Labs: BMET Recent Labs  Lab 02/10/20 0416 02/11/20 0453 02/12/20 0359 02/13/20 0627 02/14/20 0852  NA 134* 132* 133* 137 134*  K 4.8 4.6 5.1 4.1 4.2  CL 94* 93* 93* 95* 94*  CO2 '29 29 28 30 29  '$ GLUCOSE 133* 129* 163* 119* 158*  BUN 165* 175* 184* 76* 107*  CREATININE 2.40* 2.45* 2.62* 1.49* 1.90*  CALCIUM 8.8* 8.9 8.7* 8.8* 8.9  PHOS 5.6* 6.2* 6.3* 4.1 4.6   CBC Recent Labs  Lab 02/11/20 0454 02/12/20 1605 02/13/20 0627 02/14/20 1209  WBC 5.3 5.2 4.2 5.7  HGB 8.1* 7.9* 8.4* 8.1*  HCT 27.0* 26.2* 28.7* 27.2*  MCV 94.7 92.9 93.5 95.4  PLT 127* 122* 131* 147*    Medications:    . amLODipine  10 mg Oral Daily  . B-complex with vitamin C  1 tablet Per Tube Daily  . bisacodyl  10 mg Rectal Daily  . busPIRone  5 mg Per Tube BID  . calcium acetate  667 mg Oral TID WC  . camphor-menthol   Topical QID  . chlorhexidine gluconate (MEDLINE KIT)  15 mL Mouth Rinse BID  . Chlorhexidine Gluconate Cloth  6 each Topical Q0600  . darbepoetin (ARANESP) injection - DIALYSIS  100 mcg Subcutaneous Q Thu-HD  . feeding supplement (  NEPRO CARB STEADY)  1,000 mL Oral Q24H  . feeding supplement (PRO-STAT SUGAR FREE 64)  30 mL Per Tube TID  . guaiFENesin  10 mL Per Tube BID  . heparin  5,000 Units Subcutaneous Q8H  . insulin aspart  0-15 Units Subcutaneous Q4H  . pantoprazole sodium  40 mg Per Tube Q24H  . PARoxetine  40 mg Per Tube Daily  . QUEtiapine  25 mg Oral BID  . sodium chloride flush  10-40 mL Intracatheter Q12H  . traZODone  25 mg Per Tube QHS   Elmarie Shiley, MD 02/16/2020, 10:40 AM

## 2020-02-16 NOTE — Progress Notes (Signed)
Dr. Posey Pronto at bedside, pt changed to [facility].  Pt vomited blood, suction at bedside.  Holding TF.  Meds, tests ordered.  Phenergan 12.5 mg IV given for nausea.  Sent message to pharmacy to send antibiotics asap.  Temp 100.2, Dr. Posey Pronto notified.

## 2020-02-16 NOTE — Progress Notes (Addendum)
   02/16/20 2200  Vitals  BP (!) 84/46  MAP (mmHg) (!) 60  Pulse Rate 73  ECG Heart Rate 73  Resp 14  Oxygen Therapy  SpO2 100 %  MEWS Score  MEWS Temp 0  MEWS Systolic 1  MEWS Pulse 0  MEWS RR 0  MEWS LOC 0  MEWS Score 1  MEWS Score Color Green  Patient resting quietly in the bed,no sign of distress noted, O2 sat 99% on HFNC10L Opyd MD notified 250cc normal saline bolus ordered.

## 2020-02-16 NOTE — Progress Notes (Signed)
Converted to red MEWS and transferred to [facility] via bed by [facility] and Almyra Free, Therapist, sports.

## 2020-02-16 NOTE — Progress Notes (Signed)
Cardiac monitoring  Initiated.

## 2020-02-16 NOTE — Significant Event (Signed)
[facility] Event Note  Overview: Time Called: 5993 Arrival Time: 5701 Event Type: Respiratory Pt having persistent cough this morning inducing stomach discomfort and vomiting.  Initial Focused Assessment: Pt lying in bed, alert. Pt is able to follow commands, pt is moving all extremities. Lung sounds are diminished, rhochus. Pt's cough is congested and she is unable to cough up secretions. Pt has episode of emesis while I was in the room. Emesis is clear, yellow liquid with chunks. Abdomen is round, soft, bowel sounds faint. Pt states she is cold. Pt persistently calling for staff to be in the room with her, forgetful. Attempted to provide oral suctioning and patient refused. Oral suction set up at head of bed.   VS: T 100.14F, BP 123/55, HR 110, SpO2 90% on 4LNC.  CBG: 179  Interventions: No intervention from RR RN  Orders received from provider:  -CXR completed prior to my arrival -New orders for antibiotic coverage, Robitussin & Zofran given. -Phenergan and Reglan for persistent nausea/vomiting, hold tube feeds  Plan of Care (if not transferred): -Recommend Chloraseptic spray for throat irritation -Recommend frequent oral suctioning -Pt unable to cough up secretions, may benefit from NTS -Continuous pulse oximetry monitoring -Encourage deep breath and coughing  -Monitor pt for changes in mentation -Treat nausea & vomiting -Titrate supplemental oxygen as needed  Call rapid response for additional needs  Event Summary: Outcome: Stayed in room and stabalized Event End Time: 7793 RR RN called away to another emergency.   Casimer Bilis

## 2020-02-16 NOTE — Progress Notes (Signed)
   02/16/20 1224  Vitals  Temp 100.2 F (37.9 C)  Temp Source Oral  BP (!) 123/55  BP Location Left Arm  BP Method Automatic  Patient Position (if appropriate) Lying  Pulse Rate (!) 110  Pulse Rate Source Dinamap  Resp (!) 24  Level of Consciousness  Level of Consciousness Alert  Oxygen Therapy  SpO2 90 %  O2 Device Nasal Cannula  O2 Flow Rate (L/min) 4 L/min  Glasgow Coma Scale  Eye Opening 4  MEWS Score  MEWS Temp 0  MEWS Systolic 0  MEWS Pulse 1  MEWS RR 1  MEWS LOC 0  MEWS Score 2  MEWS Score Color Yellow  Provider Notification  Provider Name/Title Dr. Posey Pronto  Date Provider Notified 02/16/20  Time Provider Notified 5825  Notification Type Face-to-face  Notification Reason Change in status  Response See new orders  Date of Provider Response 02/16/20  Time of Provider Response 1898  [facility] Notification  Name of Rapid Response RN Notified [facility]  Date [facility] Notified 02/16/20  Time [facility] Notified 4210  Note  Observations see progress notes

## 2020-02-16 NOTE — Progress Notes (Addendum)
PROGRESS NOTE    Bailey Cox  YQM:[id number] DOB: [birth year] DOA: 01/05/2020 PCP: Patient, No Pcp Per   Brief Narrative:  HPI on 01/05/2020 by Mr. Georgann Housekeeper, NP (ICU) 74 year old female with PMH as below, which is significant for long admission in the [facility] system from 10/2019 all the way through 01/05/2020.  She was originally hospitalized in September 2020 with acute cholecystitis and underwent a cholecystectomy.  She was then sent to SNF for short duration but had return to [facility] to have a bile duct stent placed ultimately returning to SNF.  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  While hospitalized she developed fluid overload and was ultimately transferred to [facility] for dialysis.  Her course after that point become somewhat unclear, but as described by her family she was on and off the ventilator for several procedures and was eventually left intubated.  One of these procedures was an endoscopy during which she unfortunately suffered a cardiac arrest.  Details of the arrest are unclear.  She remained on the ventilator and ultimately underwent tracheostomy.  Her hospital course was also complicated by MRSA pneumonia.  She was ultimately able to come off of hemodialysis and was treated intermittently with diuresis.  It sounds like she bounced in and out of the ICU a few times with volume overload.  On the tail end of her admission she still struggled with kidney disease and was being treated for a urinary tract infection with cefepime and vancomycin.  Plans were to stop on 3/2.  She was discharged from the [facility] system on 3/1 to [facility] in [place].  Upon arrival to [facility] they deemed her too sick for admission and transferred her to [facility] emergency department with complaints of volume overload and acidosis.   Interim history Initially admitted by ICU this patient was ventilator dependent, into trach, which  is now been removed.  Patient started CRRT on 01/13/2020 until 01/16/2020 and transferred to the floor on 01/18/2020 as she was tolerating hemodialysis.  Nephrology has been seeing her and feels that she has AKI on CKD most likely being from ATN/sepsis.  Patient family wants aggressive measures.  CRRT was discontinued on 01/15/2020.  Patient now has [facility] and is scheduled for intermittent dialysis.  Continues to refuse to go to dialysis.  Ethics committee meeting 02/10/2020. 4/12 transfer to progressive care unit given her hypoxia from pneumonia. Assessment & Plan   Acute kidney injury on chronic kidney disease, stage IV -Nephrology consulted -Initially thought to be AKI/ATN from sepsis -Family desires for continued dialysis -Now on intermittent HD.   -Psychiatry consulted for capacity, patient does not have capacity to make her own decisions -Palliative care also consulted and continues to be full code -Continues to refuse dialysis -Ethics consulted -Family plans to stay around to facilitate hemodialysis.  Aspiration pneumonia 02/16/2020. Mild blood tinged vomit Posttussive vomiting Patient started having a tickle in the throat.  Started coughing significantly that led to vomiting episodes. Start becoming hypoxic. Chest x-ray shows evidence of bilateral pneumonia. Started on IV ceftriaxone and azithromycin. We will follow up on the cultures. Patient will be n.p.o. and tube feedings will be on hold. IV Protonix twice daily. Monitor closely in the stepdown unit. Chest PT with vest.  MRSA pneumonia -Completed antibiotics  Acute on chronic hypoxic respiratory failure. -Patient did have tracheostomy however was decannulated on 01/23/2020 -Continue supportive measures -Weaned down to 2 L, Now due to  pneumonia again patient hypoxic.  Monitor.  Diabetes mellitus, type II uncontrolled with hyper and hypoglycemia. -Hemoglobin A1c 5.7 -Continue insulin sliding scale CBG monitoring  Essential  hypertension -Continue PRN metoprolol  Anemia of chronic kidney disease -Continue Aranesp -Monitor CBC  Hyponatremia -Resolved  Anxiety/depression Agitation -Continue Paxil, BuSpar, trazodone Significant agitation noted at hemodialysis  As needed Haldol and scheduled Seroquel.  Monitor.  GERD -Continue PPI  Abdominal pain. 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 X-ray abdomen does not show any evidence of significant distention.  It does show mild to moderate fecal material. We will monitor and continue stool softener.  Goals of care -Palliative care consulted, patient remains full code -Of note palliative care discussion was had with patient was at [facility].  Previous hospitalist discussed with her daughter, and understands prognosis is guarded. -Psychiatry consulted for capacity, patient does not have capacity to make her own decisions -Ethics consulted -[name] who is the POA tells me that she wants to continue hemodialysis and patient did not have any problems with hemodialysis when she was at [place] is to continue hemodialysis in presence of a family member with hope to transition to SNF near their home and continued to remain present for hemodialysis in future. I have informed them that currently in order to facilitate safe hemodialysis patient is requiring IV benzodiazepine, IV pain medication as well as psychotropic medications.  This can be akin to chemical restraint and outpatient hemodialysis centers will not be able to continue that regimen.  Nutritional support -She does not want to eat -currently has peg tube and on Tube feeds  DVT Prophylaxis SCD.  Holding heparin.  Code Status: Full  Family Communication: No family at bedside.  Informed daughter regarding patient's current progress.  Disposition Plan: Admitted from [place]. Now with renal failure needing HD, but continues to refuse. Dispo TBD. LTAC has declined patient. Disposition pending  patient's ability to dialyze in a chair.  [place] has declined transfer.   Consultants Nephrology PCCM Psychiatry Palliative care Interventional radiology Ethics  Procedures  Echcoardiogram 01/06/2020 shows an EF of 65 to 76%, grade 2 diastolic dysfunction.  RVSP.  Antibiotics   Anti-infectives (From admission, onward)   Start     Dose/Rate Route Frequency Ordered Stop   02/16/20 1230  cefTRIAXone (ROCEPHIN) 2 g in sodium chloride 0.9 % 100 mL IVPB     2 g 200 mL/hr over 30 Minutes Intravenous Every 24 hours 02/16/20 1127 02/21/20 1229   02/16/20 1230  azithromycin (ZITHROMAX) 500 mg in sodium chloride 0.9 % 250 mL IVPB     500 mg 250 mL/hr over 60 Minutes Intravenous Every 24 hours 02/16/20 1127 02/21/20 1229   01/10/20 0930  vancomycin (VANCOCIN) IVPB 1000 mg/200 mL premix  Status:  Discontinued     1,000 mg 200 mL/hr over 60 Minutes Intravenous  Once 01/10/20 0915 01/13/20 0623   01/06/20 2200  ceFEPIme (MAXIPIME) 2 g in sodium chloride 0.9 % 100 mL IVPB     2 g 200 mL/hr over 30 Minutes Intravenous Every 24 hours 01/05/20 2251 01/11/20 2234   01/05/20 2200  ceFEPIme (MAXIPIME) 2 g in sodium chloride 0.9 % 100 mL IVPB     2 g 200 mL/hr over 30 Minutes Intravenous  Once 01/05/20 2148 01/06/20 0015   01/05/20 2200  metroNIDAZOLE (FLAGYL) IVPB 500 mg     500 mg 100 mL/hr over 60 Minutes Intravenous  Once 01/05/20 2148 01/06/20 0015   01/05/20 2200  vancomycin (VANCOCIN)  IVPB 1000 mg/200 mL premix  Status:  Discontinued     1,000 mg 200 mL/hr over 60 Minutes Intravenous  Once 01/05/20 2148 01/05/20 2151   01/05/20 2200  vancomycin (VANCOREADY) IVPB 2000 mg/400 mL  Status:  Discontinued     2,000 mg 200 mL/hr over 120 Minutes Intravenous  Once 01/05/20 2151 01/05/20 2223     Subjective:   Bailey Cox seen and examined today.  Started having cough as well as shortness of breath.  Also started having posttussive vomiting.  No nausea no vomiting.  Reports abdominal  pain.  Objective:   Vitals:   02/16/20 1555 02/16/20 1600 02/16/20 1608 02/16/20 1701  BP:   (!) 113/54 (!) 98/53  Pulse:   (!) 101 95  Resp:   (!) 28 (!) 27  Temp:   (!) 102.8 F (39.3 C) (!) 100.4 F (38 C)  TempSrc:   Oral Oral  SpO2: 93% 94% 95%   Weight:      Height:        Intake/Output Summary (Last 24 hours) at 02/16/2020 1934 Last data filed at 02/16/2020 0900 Gross per 24 hour  Intake 0 ml  Output 0 ml  Net 0 ml   Filed Weights   02/14/20 1532 02/15/20 0458 02/16/20 0359  Weight: 82.4 kg 82.8 kg 83.4 kg   General: lethargic and oriented to place and person. Appear in moderate distress, affect anxious Eyes: PERRL, Conjunctiva normal ENT: Oral Mucosa Clear, moist  Neck: no JVD, no Abnormal Mass Or lumps Cardiovascular: S1 and S2 Present, no Murmur,  Respiratory: increased respiratory effort, Bilateral Air entry equal and Decreased, no signs of accessory muscle use, bilateral Crackles, no wheezes Abdomen: Bowel Sound present, Soft and no tenderness, no hernia Skin: no rashes  Extremities: no Pedal edema, no calf tenderness Neurologic: without any new focal findings Gait not checked due to patient safety concerns   Data Reviewed: I have personally reviewed following labs and imaging studies  CBC: Recent Labs  Lab 02/11/20 0454 02/12/20 1605 02/13/20 0627 02/14/20 1209 02/16/20 1216  WBC 5.3 5.2 4.2 5.7 12.9*  HGB 8.1* 7.9* 8.4* 8.1* 9.8*  HCT 27.0* 26.2* 28.7* 27.2* 34.1*  MCV 94.7 92.9 93.5 95.4 95.3  PLT 127* 122* 131* 147* 920   Basic Metabolic Panel: Recent Labs  Lab 02/10/20 0416 02/10/20 0416 02/11/20 0453 02/11/20 0454 02/12/20 0359 02/13/20 0627 02/14/20 0852 02/16/20 1216  NA 134*   < > 132*  --  133* 137 134* 135  K 4.8   < > 4.6  --  5.1 4.1 4.2 4.2  CL 94*   < > 93*  --  93* 95* 94* 95*  CO2 29   < > 29  --  '28 30 29 26  '$ GLUCOSE 133*   < > 129*  --  163* 119* 158* 188*  BUN 165*   < > 175*  --  184* 76* 107* 85*  CREATININE  2.40*   < > 2.45*  --  2.62* 1.49* 1.90* 1.87*  CALCIUM 8.8*   < > 8.9  --  8.7* 8.8* 8.9 9.3  MG 2.7*  --   --  2.7* 2.8* 2.3 2.6*  --   PHOS 5.6*  --  6.2*  --  6.3* 4.1 4.6  --    < > = values in this interval not displayed.   GFR: Estimated Creatinine Clearance: 28.8 mL/min (A) (by C-G formula based on SCr of 1.87 mg/dL (H)). Liver Function Tests:  Recent Labs  Lab 02/10/20 0416 02/11/20 0453 02/12/20 0359 02/13/20 0627 02/14/20 0852  ALBUMIN 2.6* 2.6* 2.7* 2.7* 2.7*   No results for input(s): LIPASE, AMYLASE in the last 168 hours. No results for input(s): AMMONIA in the last 168 hours. Coagulation Profile: No results for input(s): INR, PROTIME in the last 168 hours. Cardiac Enzymes: No results for input(s): CKTOTAL, CKMB, CKMBINDEX, TROPONINI in the last 168 hours. BNP (last 3 results) No results for input(s): PROBNP in the last 8760 hours. HbA1C: No results for input(s): HGBA1C in the last 72 hours. CBG: Recent Labs  Lab 02/15/20 2356 02/16/20 0357 02/16/20 0827 02/16/20 1228 02/16/20 1603  GLUCAP 139* 122* 118* 179* 183*   Lipid Profile: No results for input(s): CHOL, HDL, LDLCALC, TRIG, CHOLHDL, LDLDIRECT in the last 72 hours. Thyroid Function Tests: No results for input(s): TSH, T4TOTAL, FREET4, T3FREE, THYROIDAB in the last 72 hours. Anemia Panel: No results for input(s): VITAMINB12, FOLATE, FERRITIN, TIBC, IRON, RETICCTPCT in the last 72 hours. Urine analysis:    Component Value Date/Time   COLORURINE AMBER (A) 01/13/2020 1302   APPEARANCEUR TURBID (A) 01/13/2020 1302   LABSPEC 1.018 01/13/2020 1302   PHURINE 5.0 01/13/2020 1302   GLUCOSEU NEGATIVE 01/13/2020 1302   HGBUR SMALL (A) 01/13/2020 1302   BILIRUBINUR NEGATIVE 01/13/2020 1302   KETONESUR NEGATIVE 01/13/2020 1302   PROTEINUR >=300 (A) 01/13/2020 1302   NITRITE NEGATIVE 01/13/2020 1302   LEUKOCYTESUR LARGE (A) 01/13/2020 1302   Radiology Studies: DG Chest Port 1 View  Result Date:  02/16/2020 CLINICAL DATA:  Cough, congestion EXAM: PORTABLE CHEST 1 VIEW COMPARISON:  01/21/2020 FINDINGS: Interval removal of tracheostomy. Right dialysis catheter remains in place, unchanged. Cardiomegaly. Bilateral lower lobe airspace opacities are noted. No effusions. No acute bony abnormality. IMPRESSION: Bilateral lower lobe airspace opacities concerning for pneumonia. Electronically Signed   By: Rolm Baptise M.D.   On: 02/16/2020 11:19   DG Abd Portable 1V  Result Date: 02/16/2020 CLINICAL DATA:  Abdominal pain EXAM: PORTABLE ABDOMEN - 1 VIEW COMPARISON:  02/02/2020 FINDINGS: Scattered large and small bowel gas is noted. Gastrostomy catheter is seen within the stomach. Mild to moderate retained fecal material is seen without obstruction. No free air is noted. IMPRESSION: Retained fecal material without obstruction. No other focal abnormality is noted. Electronically Signed   By: Inez Catalina M.D.   On: 02/16/2020 18:50    Scheduled Meds: . amLODipine  10 mg Oral Daily  . bisacodyl  10 mg Rectal Daily  . busPIRone  5 mg Per Tube BID  . calcium acetate  667 mg Oral TID WC  . camphor-menthol   Topical QID  . chlorhexidine gluconate (MEDLINE KIT)  15 mL Mouth Rinse BID  . Chlorhexidine Gluconate Cloth  6 each Topical Q0600  . darbepoetin (ARANESP) injection - DIALYSIS  100 mcg Subcutaneous Q Thu-HD  . insulin aspart  0-15 Units Subcutaneous Q4H  . pantoprazole (PROTONIX) IV  40 mg Intravenous Q12H  . PARoxetine  40 mg Per Tube Daily  . QUEtiapine  25 mg Oral BID  . sodium chloride flush  10-40 mL Intracatheter Q12H  . traZODone  25 mg Per Tube QHS   Continuous Infusions: . azithromycin 500 mg (02/16/20 1410)  . cefTRIAXone (ROCEPHIN)  IV 2 g (02/16/20 1306)  . sodium chloride       LOS: 42 days   Time Spent in minutes   30 minutes  Author:  Berle Mull, MD [facility] 02/16/2020  7:34 PM  To reach On-call, see care teams to locate the attending and reach out to them  via [email or web address]. If 7PM-7AM, please contact night-coverage If you still have difficulty reaching the attending provider, please page the [facility] (Director on Call) for [facility] on amion for assistance.

## 2020-02-17 DIAGNOSIS — L8990 Pressure ulcer of unspecified site, unspecified stage: Secondary | ICD-10-CM | POA: Insufficient documentation

## 2020-02-17 DIAGNOSIS — N179 Acute kidney failure, unspecified: Secondary | ICD-10-CM | POA: Diagnosis not present

## 2020-02-17 LAB — GLUCOSE, CAPILLARY
Glucose-Capillary: 105 mg/dL — ABNORMAL HIGH (ref 70–99)
Glucose-Capillary: 112 mg/dL — ABNORMAL HIGH (ref 70–99)
Glucose-Capillary: 71 mg/dL (ref 70–99)
Glucose-Capillary: 81 mg/dL (ref 70–99)
Glucose-Capillary: 99 mg/dL (ref 70–99)

## 2020-02-17 LAB — CBC
HCT: 28.5 % — ABNORMAL LOW (ref 36.0–46.0)
Hemoglobin: 8.3 g/dL — ABNORMAL LOW (ref 12.0–15.0)
MCH: 27.8 pg (ref 26.0–34.0)
MCHC: 29.1 g/dL — ABNORMAL LOW (ref 30.0–36.0)
MCV: 95.3 fL (ref 80.0–100.0)
Platelets: 140 10*3/uL — ABNORMAL LOW (ref 150–400)
RBC: 2.99 MIL/uL — ABNORMAL LOW (ref 3.87–5.11)
RDW: 16.2 % — ABNORMAL HIGH (ref 11.5–15.5)
WBC: 11.1 10*3/uL — ABNORMAL HIGH (ref 4.0–10.5)
nRBC: 0 % (ref 0.0–0.2)

## 2020-02-17 LAB — BASIC METABOLIC PANEL
Anion gap: 11 (ref 5–15)
BUN: 105 mg/dL — ABNORMAL HIGH (ref 8–23)
CO2: 28 mmol/L (ref 22–32)
Calcium: 8.7 mg/dL — ABNORMAL LOW (ref 8.9–10.3)
Chloride: 97 mmol/L — ABNORMAL LOW (ref 98–111)
Creatinine, Ser: 2.31 mg/dL — ABNORMAL HIGH (ref 0.44–1.00)
GFR calc Af Amer: 24 mL/min — ABNORMAL LOW (ref >60)
GFR calc non Af Amer: 21 mL/min — ABNORMAL LOW (ref >60)
Glucose, Bld: 120 mg/dL — ABNORMAL HIGH (ref 70–99)
Potassium: 3.6 mmol/L (ref 3.5–5.1)
Sodium: 136 mmol/L (ref 135–145)

## 2020-02-17 MED ORDER — PANTOPRAZOLE SODIUM 40 MG PO PACK
40.0000 mg | PACK | Freq: Two times a day (BID) | ORAL | Status: DC
  Administered 2020-02-17 – 2020-03-21 (×65): 40 mg
  Filled 2020-02-17 (×65): qty 20

## 2020-02-17 MED ORDER — HEPARIN SODIUM (PORCINE) 1000 UNIT/ML IJ SOLN
INTRAMUSCULAR | Status: AC
  Administered 2020-02-17: 3800 [IU]
  Filled 2020-02-17: qty 4

## 2020-02-17 MED ORDER — ALBUMIN HUMAN 25 % IV SOLN: 25.0000 g | Freq: Once | INTRAVENOUS | Status: AC

## 2020-02-17 MED ORDER — MIDODRINE HCL 5 MG PO TABS
10.0000 mg | ORAL_TABLET | Freq: Once | ORAL | Status: AC
  Administered 2020-02-17: 17:00:00 10 mg
  Filled 2020-02-17: qty 2

## 2020-02-17 MED ORDER — HEPARIN SODIUM (PORCINE) 1000 UNIT/ML DIALYSIS: 40.0000 [IU]/kg | INTRAMUSCULAR | Status: DC | PRN

## 2020-02-17 MED ORDER — ALBUMIN HUMAN 25 % IV SOLN
INTRAVENOUS | Status: AC
  Administered 2020-02-17: 25 g via INTRAVENOUS
  Filled 2020-02-17: qty 100

## 2020-02-17 MED ORDER — NEPRO/CARBSTEADY PO LIQD
1000.0000 mL | ORAL | Status: DC
  Administered 2020-02-17: 17:00:00 1000 mL via ORAL
  Filled 2020-02-17 (×2): qty 1000

## 2020-02-17 NOTE — Progress Notes (Signed)
  Speech Language Pathology Treatment: Dysphagia  Patient Details Name: Bailey Cox MRN: [id number] DOB: February 01, 1946 Today's Date: 02/17/2020 Time: 3810-1751 SLP Time Calculation (min) (ACUTE ONLY): 12 min  Assessment / Plan / Recommendation Clinical Impression  Pt much more receptive to eating/drinking this afternoon (Had HD this am).  She requested coffee with "lots of cream" and jello.  Consumed several sips of coffee, which she appeared to enjoy, and had four bites of jello.  There was good oral attention and + ability to feed herself.  No overt s/s of aspiration were present.  Recommend beginning an oral diet to allow her some options based on preferences; remain upright 30 min after eating/drinking. D/w Dr. Posey Pronto.  Orders written for full liquids; d/w RN.  SLP will follow.   HPI HPI: 74 y.o. female who had a prolonged 66 day hospitalization at [facility] with a history of HTN COPD DM hypothyroidism Ogilvie's syndrome. PEA arrest during EGD in early December with duodenal ulcer noted on EGD. Intermittent hyperkalemia treatet medically w/ Kayexalate and now recently w/ worsening renal function over the past few days prior to admission to [facility].  during admission at [facility] pt weaned off the vent and eventually decannulated. According to her daughter, she has not had anything to eat or drink since November due to primary problem Olglivie's Syndrome, but had no difficulty with swallowing prior.      SLP Plan  Continue with current plan of care       Recommendations  Diet recommendations: Other(comment)(full liquids) Liquids provided via: Teaspoon;Cup;Straw Supervision: Patient able to self feed;Staff to assist with self feeding Postural Changes and/or Swallow Maneuvers: Seated upright 90 degrees                Oral Care Recommendations: Oral care BID SLP Visit Diagnosis: Dysphagia, unspecified (R13.10) Plan: Continue with current plan of care       GO               Bailey Cox L. Bailey Cox, [place] CCC/SLP Acute Rehabilitation Services Office number [phone] Pager [phone]   Bailey Cox Bailey Cox 02/17/2020, 3:45 PM

## 2020-02-17 NOTE — Progress Notes (Signed)
02/16/2020 1645 Received pt to room 4E-21 from 6N.  Pt tx'd to 4E due to decreasing respiratory status/? Pneumonia.  Tele monitor applied and CCMD notified.  CHG bath given.  Pt's O2 sats on 10L HF are 98-100%.  Oriented to room, call light and bed.  Call bell in reach. [place]

## 2020-02-17 NOTE — Progress Notes (Addendum)
PROGRESS NOTE    Bailey Cox  TZG:[id number] DOB: 11/21/1945 DOA: 01/05/2020 PCP: Patient, No Pcp Per   Brief Narrative:  HPI on 01/05/2020 by Mr. Georgann Housekeeper, NP (ICU) 74 year old female with PMH as below, which is significant for long admission in the [facility] system from 10/2019 all the way through 01/05/2020.  She was originally hospitalized in September 2020 with acute cholecystitis and underwent a cholecystectomy.  She was then sent to SNF for short duration but had return to [facility] to have a bile duct stent placed ultimately returning to SNF.  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  While hospitalized she developed fluid overload and was ultimately transferred to [facility] for dialysis.  Her course after that point become somewhat unclear, but as described by her family she was on and off the ventilator for several procedures and was eventually left intubated.  One of these procedures was an endoscopy during which she unfortunately suffered a cardiac arrest.  Details of the arrest are unclear.  She remained on the ventilator and ultimately underwent tracheostomy.  Her hospital course was also complicated by MRSA pneumonia.  She was ultimately able to come off of hemodialysis and was treated intermittently with diuresis.  It sounds like she bounced in and out of the ICU a few times with volume overload.  On the tail end of her admission she still struggled with kidney disease and was being treated for a urinary tract infection with cefepime and vancomycin.  Plans were to stop on 3/2.  She was discharged from the [facility] system on 3/1 to [facility] in [place].  Upon arrival to [facility] they deemed her too sick for admission and transferred her to [facility] emergency department with complaints of volume overload and acidosis.   Interim history Initially admitted by ICU this patient was ventilator dependent, into trach, which  is now been removed.  Patient started CRRT on 01/13/2020 until 01/16/2020 and transferred to the floor on 01/18/2020 as she was tolerating hemodialysis.  Nephrology has been seeing her and feels that she has AKI on CKD most likely being from ATN/sepsis.  Patient family wants aggressive measures.  CRRT was discontinued on 01/15/2020.  Patient now has [facility] and is scheduled for intermittent dialysis.  Continues to refuse to go to dialysis.  Ethics committee meeting 02/10/2020. 4/12 transfer to progressive care unit given her hypoxia from pneumonia. Assessment & Plan   Acute kidney injury on chronic kidney disease, stage IV -Nephrology consulted -Initially thought to be AKI/ATN from sepsis -Family desires for continued dialysis -Now on intermittent HD.   -Psychiatry consulted for capacity, patient does not have capacity to make her own decisions -Palliative care also consulted and continues to be full code -Continues to refuse dialysis -Ethics consulted -Family plans to stay around to facilitate hemodialysis.  Aspiration pneumonia 02/16/2020. Mild blood tinged vomit Posttussive vomiting Patient started having a tickle in the throat.  Started coughing significantly that led to vomiting episodes. Start becoming hypoxic. Chest x-ray shows evidence of bilateral pneumonia. Started on IV ceftriaxone and azithromycin. We will follow up on the cultures. no further vomiting. Not sure whether this is aspiration pneumonitis versus aspiration pneumonia. We will continue with the antibiotics. Advance tube feedings to trickle. Also advanced full liquids.  Monitor. Repeat chest x-ray tomorrow on 02/18/2020. Patient was started on IV Protonix with the concern for blood-tinged vomit.  H&H stable.  No further bleeding.  Change  IV Protonix to Protonix twice daily by 2.  MRSA pneumonia -Completed antibiotics  Acute on chronic hypoxic respiratory failure. -Patient did have tracheostomy however was decannulated on  01/23/2020 -Continue supportive measures -Weaned down to 2 L, Now due to pneumonia again patient hypoxic.  Monitor.  Diabetes mellitus, type II uncontrolled with hyper and hypoglycemia. -Hemoglobin A1c 5.7 -Continue insulin sliding scale CBG monitoring  Essential hypertension -Continue PRN metoprolol  Anemia of chronic kidney disease -Continue Aranesp -Monitor CBC  Hyponatremia -Resolved  Anxiety/depression Agitation -Continue Paxil, BuSpar, trazodone Significant agitation noted at hemodialysis  As needed Haldol and scheduled Seroquel.  Monitor.  GERD -Continue PPI  Abdominal pain. 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 X-ray abdomen does not show any evidence of significant distention.  It does show mild to moderate fecal material. We will monitor and continue stool softener.  Goals of care -Palliative care consulted, patient remains full code -Of note palliative care discussion was had with patient was at [facility].  Previous hospitalist discussed with her daughter, and understands prognosis is guarded. -Psychiatry consulted for capacity, patient does not have capacity to make her own decisions -Ethics consulted -[name] who is the POA tells me that she wants to continue hemodialysis and patient did not have any problems with hemodialysis when she was at [place] is to continue hemodialysis in presence of a family member with hope to transition to SNF near their home and continued to remain present for hemodialysis in future. I have informed them that currently in order to facilitate safe hemodialysis patient is requiring IV benzodiazepine, IV pain medication as well as psychotropic medications.  This can be akin to chemical restraint and outpatient hemodialysis centers will not be able to continue that regimen.  Nutritional support -She does not want to eat -currently has peg tube and on Tube feeds  DVT Prophylaxis SCD.  Holding heparin.  Code Status:  Full  Family Communication: No family at bedside.  Informed daughter regarding patient's current progress.  Disposition Plan: Admitted from [facility]. Now with renal failure needing HD, but continues to refuse. Dispo TBD. LTAC has declined patient. Disposition pending patient's ability to dialyze in a chair.  [facility] has declined transfer.   Consultants Nephrology PCCM Psychiatry Palliative care Interventional radiology Ethics  Procedures  Echcoardiogram 01/06/2020 shows an EF of 65 to 27%, grade 2 diastolic dysfunction.  RVSP.  Antibiotics   Anti-infectives (From admission, onward)   Start     Dose/Rate Route Frequency Ordered Stop   02/16/20 1230  cefTRIAXone (ROCEPHIN) 2 g in sodium chloride 0.9 % 100 mL IVPB     2 g 200 mL/hr over 30 Minutes Intravenous Every 24 hours 02/16/20 1127 02/21/20 0959   02/16/20 1230  azithromycin (ZITHROMAX) 500 mg in sodium chloride 0.9 % 250 mL IVPB     500 mg 250 mL/hr over 60 Minutes Intravenous Every 24 hours 02/16/20 1127 02/21/20 0959   01/10/20 0930  vancomycin (VANCOCIN) IVPB 1000 mg/200 mL premix  Status:  Discontinued     1,000 mg 200 mL/hr over 60 Minutes Intravenous  Once 01/10/20 0915 01/13/20 0623   01/06/20 2200  ceFEPIme (MAXIPIME) 2 g in sodium chloride 0.9 % 100 mL IVPB     2 g 200 mL/hr over 30 Minutes Intravenous Every 24 hours 01/05/20 2251 01/11/20 2234   01/05/20 2200  ceFEPIme (MAXIPIME) 2 g in sodium chloride 0.9 % 100 mL IVPB     2 g 200 mL/hr over 30 Minutes Intravenous  Once 01/05/20 2148 01/06/20 0015   01/05/20 2200  metroNIDAZOLE (FLAGYL) IVPB 500 mg     500 mg 100 mL/hr over 60 Minutes Intravenous  Once 01/05/20 2148 01/06/20 0015   01/05/20 2200  vancomycin (VANCOCIN) IVPB 1000 mg/200 mL premix  Status:  Discontinued     1,000 mg 200 mL/hr over 60 Minutes Intravenous  Once 01/05/20 2148 01/05/20 2151   01/05/20 2200  vancomycin (VANCOREADY) IVPB 2000 mg/400 mL  Status:  Discontinued     2,000 mg 200 mL/hr  over 120 Minutes Intravenous  Once 01/05/20 2151 01/05/20 2223     Subjective:   Bailey Cox seen and examined today.  No acute complaint no nausea no vomiting.  No acute events overnight.  Reports fatigue and tiredness.  Still confused.  Objective:   Vitals:   02/17/20 1100 02/17/20 1120 02/17/20 1143 02/17/20 1500  BP: (!) 95/45 (!) 90/46 (!) 103/47 (!) 106/41  Pulse: 88 89 88 87  Resp:  16 (!) 24 18  Temp:  97.9 F (36.6 C) 98 F (36.7 C)   TempSrc:  Oral Oral   SpO2:  97% 98% 100%  Weight:  81 kg    Height:        Intake/Output Summary (Last 24 hours) at 02/17/2020 1911 Last data filed at 02/17/2020 1120 Gross per 24 hour  Intake 600 ml  Output 1342 ml  Net -742 ml   Filed Weights   02/17/20 0500 02/17/20 0745 02/17/20 1120  Weight: 86.4 kg 86.4 kg 81 kg   General: Fatigue but alert and oriented to place and person. Appear in mild distress, affect anxious Eyes: PERRL, Conjunctiva normal ENT: Oral Mucosa Clear, moist  Neck: no JVD, no Abnormal Mass Or lumps Cardiovascular: S1 and S2 Present, no Murmur,  Respiratory: increased respiratory effort, Bilateral Air entry equal and Decreased, no signs of accessory muscle use, bilateral Crackles, no wheezes Abdomen: Bowel Sound present, Soft and no tenderness, no hernia Skin: no rashes  Extremities: no Pedal edema, no calf tenderness Neurologic: without any new focal findings Gait not checked due to patient safety concerns   Data Reviewed: I have personally reviewed following labs and imaging studies  CBC: Recent Labs  Lab 02/12/20 1605 02/13/20 0627 02/14/20 1209 02/16/20 1216 02/17/20 0316  WBC 5.2 4.2 5.7 12.9* 11.1*  HGB 7.9* 8.4* 8.1* 9.8* 8.3*  HCT 26.2* 28.7* 27.2* 34.1* 28.5*  MCV 92.9 93.5 95.4 95.3 95.3  PLT 122* 131* 147* 161 016*   Basic Metabolic Panel: Recent Labs  Lab 02/11/20 0453 02/11/20 0453 02/11/20 0454 02/12/20 0359 02/13/20 0627 02/14/20 0852 02/16/20 1216 02/17/20 0316  NA  132*   < >  --  133* 137 134* 135 136  K 4.6   < >  --  5.1 4.1 4.2 4.2 3.6  CL 93*   < >  --  93* 95* 94* 95* 97*  CO2 29   < >  --  '28 30 29 26 28  '$ GLUCOSE 129*   < >  --  163* 119* 158* 188* 120*  BUN 175*   < >  --  184* 76* 107* 85* 105*  CREATININE 2.45*   < >  --  2.62* 1.49* 1.90* 1.87* 2.31*  CALCIUM 8.9   < >  --  8.7* 8.8* 8.9 9.3 8.7*  MG  --   --  2.7* 2.8* 2.3 2.6*  --   --   PHOS 6.2*  --   --  6.3*  4.1 4.6  --   --    < > = values in this interval not displayed.   GFR: Estimated Creatinine Clearance: 23 mL/min (A) (by C-G formula based on SCr of 2.31 mg/dL (H)). Liver Function Tests: Recent Labs  Lab 02/11/20 0453 02/12/20 0359 02/13/20 0627 02/14/20 0852  ALBUMIN 2.6* 2.7* 2.7* 2.7*   No results for input(s): LIPASE, AMYLASE in the last 168 hours. No results for input(s): AMMONIA in the last 168 hours. Coagulation Profile: No results for input(s): INR, PROTIME in the last 168 hours. Cardiac Enzymes: No results for input(s): CKTOTAL, CKMB, CKMBINDEX, TROPONINI in the last 168 hours. BNP (last 3 results) No results for input(s): PROBNP in the last 8760 hours. HbA1C: No results for input(s): HGBA1C in the last 72 hours. CBG: Recent Labs  Lab 02/16/20 2028 02/17/20 0036 02/17/20 0414 02/17/20 1154 02/17/20 1629  GLUCAP 106* 112* 105* 81 71   Lipid Profile: No results for input(s): CHOL, HDL, LDLCALC, TRIG, CHOLHDL, LDLDIRECT in the last 72 hours. Thyroid Function Tests: No results for input(s): TSH, T4TOTAL, FREET4, T3FREE, THYROIDAB in the last 72 hours. Anemia Panel: No results for input(s): VITAMINB12, FOLATE, FERRITIN, TIBC, IRON, RETICCTPCT in the last 72 hours. Urine analysis:    Component Value Date/Time   COLORURINE AMBER (A) 01/13/2020 1302   APPEARANCEUR TURBID (A) 01/13/2020 1302   LABSPEC 1.018 01/13/2020 1302   PHURINE 5.0 01/13/2020 1302   GLUCOSEU NEGATIVE 01/13/2020 1302   HGBUR SMALL (A) 01/13/2020 1302   BILIRUBINUR NEGATIVE  01/13/2020 1302   KETONESUR NEGATIVE 01/13/2020 1302   PROTEINUR >=300 (A) 01/13/2020 1302   NITRITE NEGATIVE 01/13/2020 1302   LEUKOCYTESUR LARGE (A) 01/13/2020 1302   Radiology Studies: DG Chest Port 1 View  Result Date: 02/16/2020 CLINICAL DATA:  Cough, congestion EXAM: PORTABLE CHEST 1 VIEW COMPARISON:  01/21/2020 FINDINGS: Interval removal of tracheostomy. Right dialysis catheter remains in place, unchanged. Cardiomegaly. Bilateral lower lobe airspace opacities are noted. No effusions. No acute bony abnormality. IMPRESSION: Bilateral lower lobe airspace opacities concerning for pneumonia. Electronically Signed   By: Rolm Baptise M.D.   On: 02/16/2020 11:19   DG Abd Portable 1V  Result Date: 02/16/2020 CLINICAL DATA:  Abdominal pain EXAM: PORTABLE ABDOMEN - 1 VIEW COMPARISON:  02/02/2020 FINDINGS: Scattered large and small bowel gas is noted. Gastrostomy catheter is seen within the stomach. Mild to moderate retained fecal material is seen without obstruction. No free air is noted. IMPRESSION: Retained fecal material without obstruction. No other focal abnormality is noted. Electronically Signed   By: Inez Catalina M.D.   On: 02/16/2020 18:50    Scheduled Meds: . bisacodyl  10 mg Rectal Daily  . busPIRone  5 mg Per Tube BID  . calcium acetate  667 mg Oral TID WC  . camphor-menthol   Topical QID  . chlorhexidine gluconate (MEDLINE KIT)  15 mL Mouth Rinse BID  . Chlorhexidine Gluconate Cloth  6 each Topical Q0600  . darbepoetin (ARANESP) injection - DIALYSIS  100 mcg Subcutaneous Q Thu-HD  . feeding supplement (NEPRO CARB STEADY)  1,000 mL Oral Q24H  . insulin aspart  0-15 Units Subcutaneous Q4H  . pantoprazole sodium  40 mg Per Tube BID  . PARoxetine  40 mg Per Tube Daily  . QUEtiapine  25 mg Per Tube BID  . sodium chloride flush  10-40 mL Intracatheter Q12H  . traZODone  25 mg Per Tube QHS   Continuous Infusions: . azithromycin 500 mg (02/17/20 1151)  . cefTRIAXone (  ROCEPHIN)  IV  2 g (02/17/20 1309)  . sodium chloride       LOS: 43 days   Time Spent in minutes   30 minutes  Author:  Berle Mull, MD [facility] 02/17/2020  7:11 PM   To reach On-call, see care teams to locate the attending and reach out to them via [email or web address]. If 7PM-7AM, please contact night-coverage If you still have difficulty reaching the attending provider, please page the [facility] (Director on Call) for [facility] on amion for assistance.

## 2020-02-17 NOTE — Progress Notes (Signed)
   02/17/20 1400  Clinical Encounter Type  Visited With Patient  Visit Type Initial  Referral From Chaplain  Consult/Referral To Chaplain   Chaplain noticed Claribel was new to 4E and had mentioned being "afraid" a few different times in recent days. Chaplain made a visit to Elrama who said, "I am fine now. What are you doing?" Chaplain explained the visit to be for support and to see if Yanitza was in need of any spiritual support. Nakeda said, "I go to my own church, and I take my own medicine. I don't need anymore." Then Zhara said good bye to Fillmore and requested that Chaplain find her RN to turn off the beeping IV. Chaplains remain available for support as needs arise.   Chaplain Resident, Evelene Croon, M Div [phone] on-call pager

## 2020-02-17 NOTE — Procedures (Signed)
Patient seen on Hemodialysis. Just given 25gm albumin and 10mg  midodrine for hypotension.  BP (!) 82/35   Pulse 87   Temp 97.7 F (36.5 C) (Oral)   Resp 17   Ht 5\' 4"  (1.626 m)   Wt 86.4 kg   SpO2 96%   BMI 32.70 kg/m   QB 400, UF goal 1L Tolerating treatment with complaints of anxiety at this time.   Elmarie Shiley MD [facility]. Office # [phone] Pager # [phone] 10:16 AM

## 2020-02-18 ENCOUNTER — Inpatient Hospital Stay (HOSPITAL_COMMUNITY): Payer: Medicare Other

## 2020-02-18 DIAGNOSIS — N179 Acute kidney failure, unspecified: Secondary | ICD-10-CM | POA: Diagnosis not present

## 2020-02-18 DIAGNOSIS — R5381 Other malaise: Secondary | ICD-10-CM

## 2020-02-18 DIAGNOSIS — E876 Hypokalemia: Secondary | ICD-10-CM

## 2020-02-18 DIAGNOSIS — N184 Chronic kidney disease, stage 4 (severe): Secondary | ICD-10-CM | POA: Diagnosis not present

## 2020-02-18 DIAGNOSIS — J9611 Chronic respiratory failure with hypoxia: Secondary | ICD-10-CM | POA: Diagnosis not present

## 2020-02-18 DIAGNOSIS — J9601 Acute respiratory failure with hypoxia: Secondary | ICD-10-CM | POA: Diagnosis not present

## 2020-02-18 DIAGNOSIS — J690 Pneumonitis due to inhalation of food and vomit: Secondary | ICD-10-CM

## 2020-02-18 LAB — CBC
HCT: 29 % — ABNORMAL LOW (ref 36.0–46.0)
Hemoglobin: 8.3 g/dL — ABNORMAL LOW (ref 12.0–15.0)
MCH: 27.6 pg (ref 26.0–34.0)
MCHC: 28.6 g/dL — ABNORMAL LOW (ref 30.0–36.0)
MCV: 96.3 fL (ref 80.0–100.0)
Platelets: 141 10*3/uL — ABNORMAL LOW (ref 150–400)
RBC: 3.01 MIL/uL — ABNORMAL LOW (ref 3.87–5.11)
RDW: 16.6 % — ABNORMAL HIGH (ref 11.5–15.5)
WBC: 17.2 10*3/uL — ABNORMAL HIGH (ref 4.0–10.5)
nRBC: 0 % (ref 0.0–0.2)

## 2020-02-18 LAB — GLUCOSE, CAPILLARY
Glucose-Capillary: 101 mg/dL — ABNORMAL HIGH (ref 70–99)
Glucose-Capillary: 108 mg/dL — ABNORMAL HIGH (ref 70–99)
Glucose-Capillary: 131 mg/dL — ABNORMAL HIGH (ref 70–99)
Glucose-Capillary: 134 mg/dL — ABNORMAL HIGH (ref 70–99)
Glucose-Capillary: 148 mg/dL — ABNORMAL HIGH (ref 70–99)
Glucose-Capillary: 173 mg/dL — ABNORMAL HIGH (ref 70–99)

## 2020-02-18 LAB — BASIC METABOLIC PANEL
Anion gap: 11 (ref 5–15)
BUN: 41 mg/dL — ABNORMAL HIGH (ref 8–23)
CO2: 30 mmol/L (ref 22–32)
Calcium: 8.3 mg/dL — ABNORMAL LOW (ref 8.9–10.3)
Chloride: 95 mmol/L — ABNORMAL LOW (ref 98–111)
Creatinine, Ser: 1.58 mg/dL — ABNORMAL HIGH (ref 0.44–1.00)
GFR calc Af Amer: 38 mL/min — ABNORMAL LOW (ref >60)
GFR calc non Af Amer: 33 mL/min — ABNORMAL LOW (ref >60)
Glucose, Bld: 126 mg/dL — ABNORMAL HIGH (ref 70–99)
Potassium: 3 mmol/L — ABNORMAL LOW (ref 3.5–5.1)
Sodium: 136 mmol/L (ref 135–145)

## 2020-02-18 MED ORDER — NEPRO/CARBSTEADY PO LIQD
1000.0000 mL | ORAL | Status: DC
  Administered 2020-02-19: 1000 mL via ORAL

## 2020-02-18 MED ORDER — PRO-STAT SUGAR FREE PO LIQD
30.0000 mL | Freq: Three times a day (TID) | ORAL | Status: DC
  Administered 2020-02-18 – 2020-03-19 (×88): 30 mL
  Filled 2020-02-18 (×85): qty 30

## 2020-02-18 MED ORDER — POTASSIUM CHLORIDE 20 MEQ/15ML (10%) PO SOLN
40.0000 meq | ORAL | Status: AC
  Administered 2020-02-18: 09:00:00 40 meq
  Filled 2020-02-18: qty 30

## 2020-02-18 MED ORDER — NEPRO/CARBSTEADY PO LIQD: 237.0000 mL | ORAL | Status: DC | PRN

## 2020-02-18 NOTE — Progress Notes (Signed)
Patient ID: Derica Leiber, female   DOB: Dec 11, 1945, 74 y.o.   MRN: [id number] [facility] Progress Note   Assessment/ Plan:   1. Acute kidney Injury on chronic kidney disease stage IV: Suspected acute kidney injury is from sepsis/ATN without any evidence of renal recovery to date.  Previously on CRRT beginning 3/7 and later transitioned to intermittent hemodialysis after hemodynamic stability established.  She has had some difficulty with willing to undertake dialysis as well as anxiety during the dialysis treatment itself for which she is premedicated.  She will need to demonstrate ability to sit in a recliner prior to her successful placement to outpatient dialysis. 2.  Sepsis/MRSA pneumonia: With increasing work of breathing/hypoxia on Monday which was attributed to presence of new infiltrate/pneumonia for which she is now on ceftriaxone and azithromycin.  She is afebrile. 3.  History of PEA arrest status post tracheostomy; decannulated on 3/19 and currently on treatment with pneumonia/oxygen supplementation. 4.  Anemia: Secondary to chronic illness exacerbated by recent hospitalization-adequate iron stores and now on ESA. 5.  Disposition: Difficult situation, appreciate input from palliative care service and will continue to provide dialysis/care.  Subjective:   Reports that she is having a good morning so far-daughter at her side confirms this.   Objective:   BP (!) 99/49 (BP Location: Left Arm)   Pulse 81   Temp 98.3 F (36.8 C) (Axillary)   Resp 17   Ht '5\' 4"'$  (1.626 m)   Wt 82.2 kg   LMP  (LMP Unknown)   SpO2 100%   BMI 31.11 kg/m   Intake/Output Summary (Last 24 hours) at 02/18/2020 0943 Last data filed at 02/18/2020 0400 Gross per 24 hour  Intake 435 ml  Output 1342 ml  Net -907 ml   Weight change: 0 kg  Physical Exam: Gen: Comfortably resting in bed, daughter at bedside CVS: Pulse regular rhythm, normal rate, S1 and S2 normal.  Tracheostomy site intact  dressing. Resp: Diminished breath sounds over bases without distinct rales or rhonchi, right IJ TDC. Abd: Soft, obese, nontender Ext: Trace - 1+ lower extremity edema  Imaging: DG CHEST PORT 1 VIEW  Result Date: 02/18/2020 CLINICAL DATA:  Shortness of breath EXAM: PORTABLE CHEST 1 VIEW COMPARISON:  February 16, 2020 FINDINGS: There is a fairly sizable right pleural effusion with consolidation in portions of the right middle and lower lobes. There is consolidation in the medial left base. There is scarring in each apical region. Heart size is normal. Pulmonary vascularity is within normal limits. No adenopathy appreciable. Central catheter tip is at the cavoatrial junction. No evident pneumothorax. Bones osteoporotic. IMPRESSION: Fairly sizable right pleural effusion with consolidation involving much of the right middle and lower lobes. Consolidation medial left base. Suspect a degree of multifocal pneumonia. Stable scarring toward the apices on each side. Stable cardiac silhouette. Stable central catheter position. No pneumothorax. Electronically Signed   By: Lowella Grip III M.D.   On: 02/18/2020 09:37   DG Chest Port 1 View  Result Date: 02/16/2020 CLINICAL DATA:  Cough, congestion EXAM: PORTABLE CHEST 1 VIEW COMPARISON:  01/21/2020 FINDINGS: Interval removal of tracheostomy. Right dialysis catheter remains in place, unchanged. Cardiomegaly. Bilateral lower lobe airspace opacities are noted. No effusions. No acute bony abnormality. IMPRESSION: Bilateral lower lobe airspace opacities concerning for pneumonia. Electronically Signed   By: Rolm Baptise M.D.   On: 02/16/2020 11:19   DG Abd Portable 1V  Result Date: 02/16/2020 CLINICAL DATA:  Abdominal pain EXAM: PORTABLE ABDOMEN -  1 VIEW COMPARISON:  02/02/2020 FINDINGS: Scattered large and small bowel gas is noted. Gastrostomy catheter is seen within the stomach. Mild to moderate retained fecal material is seen without obstruction. No free air is  noted. IMPRESSION: Retained fecal material without obstruction. No other focal abnormality is noted. Electronically Signed   By: Inez Catalina M.D.   On: 02/16/2020 18:50    Labs: BMET Recent Labs  Lab 02/12/20 0359 02/13/20 0627 02/14/20 0852 02/16/20 1216 02/17/20 0316 02/18/20 0227  NA 133* 137 134* 135 136 136  K 5.1 4.1 4.2 4.2 3.6 3.0*  CL 93* 95* 94* 95* 97* 95*  CO2 '28 30 29 26 28 30  '$ GLUCOSE 163* 119* 158* 188* 120* 126*  BUN 184* 76* 107* 85* 105* 41*  CREATININE 2.62* 1.49* 1.90* 1.87* 2.31* 1.58*  CALCIUM 8.7* 8.8* 8.9 9.3 8.7* 8.3*  PHOS 6.3* 4.1 4.6  --   --   --    CBC Recent Labs  Lab 02/14/20 1209 02/16/20 1216 02/17/20 0316 02/18/20 0227  WBC 5.7 12.9* 11.1* 17.2*  HGB 8.1* 9.8* 8.3* 8.3*  HCT 27.2* 34.1* 28.5* 29.0*  MCV 95.4 95.3 95.3 96.3  PLT 147* 161 140* 141*    Medications:    . bisacodyl  10 mg Rectal Daily  . busPIRone  5 mg Per Tube BID  . calcium acetate  667 mg Oral TID WC  . camphor-menthol   Topical QID  . chlorhexidine gluconate (MEDLINE KIT)  15 mL Mouth Rinse BID  . Chlorhexidine Gluconate Cloth  6 each Topical Q0600  . darbepoetin (ARANESP) injection - DIALYSIS  100 mcg Subcutaneous Q Thu-HD  . feeding supplement (NEPRO CARB STEADY)  1,000 mL Oral Q24H  . insulin aspart  0-15 Units Subcutaneous Q4H  . pantoprazole sodium  40 mg Per Tube BID  . PARoxetine  40 mg Per Tube Daily  . QUEtiapine  25 mg Per Tube BID  . sodium chloride flush  10-40 mL Intracatheter Q12H  . traZODone  25 mg Per Tube QHS   Elmarie Shiley, MD 02/18/2020, 9:43 AM

## 2020-02-18 NOTE — Progress Notes (Signed)
Nutrition Follow-up  DOCUMENTATION CODES:   Morbid obesity  INTERVENTION:   Currently on renal formula, may consider d/c phosphorus binder.   Continue tube feeding:  Nepro@ 44m/hr via PEG 344mProstat TID  Tube feeding regimen provides2028kcal (100% of needs),123grams of protein, and 69829mf H2O. Consider decreasing tube feeding rate as PO intake increases.   NUTRITION DIAGNOSIS:   Inadequate oral intake related to inability to eat as evidenced by NPO status.  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 Having difficulty with anxiety during treatments. Plan outpatient HD upon discharge. Tolerating Nepro at 20 ml/hr? No complaints of nausea, vomiting, or abdominal distention. Increase back to goal rate.   Diet advanced to full liquids yesterday. Tolerated grits and soup this am. RD provided pt with PO Nepro. Pt seemed to enjoy it. Will consider decreasing tube feeding as PO intake progresses and remains consistently >/= 75%.   Admission weight: 113 kg  Current weight: 82.2 kg   I/O: -5,874 ml since 3/31  Last HD yesterday: 1342 ml net UF  Medications: phoslo, aranesp, SS novolog Labs: K 3.0 (L) CBG 71-173  Diet Order:   Diet  Order            Diet full liquid Room service appropriate? No; Fluid consistency: Thin  Diet effective now              EDUCATION NEEDS:   No education needs have been identified at this time  Skin:  Skin Assessment: Reviewed RN Assessment(L groin: open wound/MASD)  Non pressure- L arm Stage I- coccyx  Last BM:  4/14  Height:   Ht Readings from Last 1 Encounters:  01/05/20 _0  (1.626 m)    Weight:   Wt Readings from Last 1 Encounters:  02/18/20 82.2 kg    Ideal Body Weight:  54.5 kg  BMI:  Body mass index is 31.11 kg/m.  Estimated Nutritional Needs:   Kcal:  1900-2100 kcal  Protein:  110-125 grams  Fluid:  >/= 1.5 L/day   CarMariana Single, LDN Clinical Nutrition Pager [place] in [place]

## 2020-02-18 NOTE — Progress Notes (Signed)
PROGRESS NOTE  Bailey Cox KDX:[id number] DOB: 01/16/1946   PCP: Patient, No Pcp Per  Patient is from: Home  DOA: 01/05/2020 LOS: 26  Brief Narrative / Interim history: 74 year old female with history of cholecystitis status post cholecystectomy in 9/20 at [facility].  She was discharged to SNF but rehospitalized to [facility] and had bile duct stent.  Discharged back to SNF but readmitted with Ogilive syndrome.  Hospital course complicated by fluid overload and transferred to [facility] for dialysis.  At [facility], developed respiratory failure requiring vent support.  She underwent trach.she was eventually discharged to [facility] on 3/1 after prolonged hospitalization at [facility] but deemed too sick, and sent to [facility] due to fluid overload and acidosis.  Patient was initially admitted to ICU.  Required CRRT 3/9-3/12.  Transfer to floor on 3/14.  She pulled her trach.  Now on oxygen by nasal cannula.  She is on IHD via [place].  Refused dialysis at times but deemed to lack capacity.  Hx committee meeting held on 4/6.   Subjective: Seen and examined earlier this morning.  No major events overnight of this morning.  Complaints pain all over.  Daughter thinks this is a chronic pain.  She is currently on 6 L by [place] saturating at 100.  Denies shortness of breath, chest pain, nausea and vomiting.  Daughter thinks she might be constipated.  Objective: Vitals:   02/18/20 0808 02/18/20 0928 02/18/20 1118 02/18/20 1135  BP: (!) 108/53 (!) 99/49 (!) 90/47 (!) 93/46  Pulse: 88 81 81   Resp: _0 Temp: 98.5 F (36.9 C) 98.3 F (36.8 C) 98.8 F (37.1 C)   TempSrc: Axillary Axillary Oral   SpO2: 92% 100% 99%   Weight:      Height:        Intake/Output Summary (Last 24 hours) at 02/18/2020 1546 Last data filed at 02/18/2020 1109 Gross per 24 hour  Intake 439 ml  Output 98 ml  Net 341 ml   Filed Weights   02/17/20 0745 02/17/20 1120 02/18/20 0000  Weight: 86.4 kg 81 kg 82.2 kg     Examination:  GENERAL: No apparent distress. Nontoxic.  HEENT: MMM.  Vision and hearing grossly intact.  NECK: Supple.  No apparent JVD.  RESP: 100% on 6 L by [place].  No IWOB.  Rhonchi bilaterally. CVS:  RRR. Heart sounds normal.  ABD/GI/GU: BS present. Soft.  Diffuse tenderness. MSK/EXT:  Moves extremities.  Trace edema mainly in upper extremities. SKIN: no apparent skin lesion or wound NEURO: Awake, alert and oriented fairly.  No apparent focal neuro deficit other than generalized weakness. PSYCH: Calm. Normal affect.  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  Aspiration pneumonia 02/16/2020 -CXR on 4/14 with RML, RLL and Left mid lung opacities concerning for multifocal pneumonia -Continue CTX and azithromycin 4/12>> -Aspiration precautions. -Trend leukocytosis -Intermittent CXR.  Acute on chronic hypoxic respiratory failure-likely due to aspiration pneumonia and fluid overload. -Self decannulated on 3/19 -Manage pneumonia as above -Wean oxygen as able  MRSA/Pseudomonas aeruginosa pneumonia-previously completed antibiotic course for this.  DM-2 with hyperglycemia: A1c 5.7%. Recent Labs  02/18/20 0451 02/18/20 0813 02/18/20 1115  GLUCAP 134* 101* 173*  -Continue current regimen  Essential hypertension: Normotensive -Continue PRN metoprolol  Anemia of chronic kidney disease -ESA and IV iron per nephrology  Hypokalemia: K 3.0. -K-Dur 40 mEq x 1  Hyponatremia:  Resolved.  Anxiety/depression Agitation -Continue Paxil, BuSpar, trazodone -Frequent orientation and delirium precautions -As needed Haldol and scheduled Seroquel.   GERD -Continue PPI  Constipation -Bowel regimen  Mild thrombocytopenia: Stable. -Continue monitoring  Generalized weakness/debility -PT/OT.  Goals of care/Ethical issues-complex medical issue with guarded prognosis.  Daughter wishes her to be full code with full scope of care including dialysis.  Patient refused HD previously but deemed to lack capacity by psych.  Requiring chemical restraints and benzodiazepines to comply with dialysis which would be difficult with outpatient dialysis. -Palliative care following -Ethics involved previously.  Nutritional support -Appreciate dietitian guidance -Continue tube feed via PEG. Nutrition Problem: Inadequate oral intake Etiology: inability to eat  Signs/Symptoms: NPO status  Interventions: Tube feeding   Pressure Injury 02/16/20 Coccyx Stage 1 -  Intact skin with non-blanchable redness of a localized area usually over a bony prominence. (Active)  02/16/20 1700  Location: Coccyx  Location Orientation:   Staging: Stage 1 -  Intact skin with non-blanchable redness of a localized area usually over a bony prominence.  Wound Description (Comments):   Present on Admission:      DVT prophylaxis: SCD in the setting of hemoptysis Code Status: Full code Family Communication: Updated patient's daughter at bedside.  Discharge barrier: Aspiration pneumonia requiring IV antibiotics, AKI/fluid overload and acute respiratory failure Patient is from: [facility] Final disposition: Likely back to [facility] once stabilized and cleared by consultants  Consultants:  PCCM (off) Nephrology   Sch Meds:  Scheduled Meds: . bisacodyl  10 mg Rectal Daily  . busPIRone  5 mg Per Tube BID  . calcium acetate  667 mg Oral TID WC  . camphor-menthol   Topical QID  .  chlorhexidine gluconate (MEDLINE KIT)  15 mL Mouth Rinse BID  . Chlorhexidine Gluconate Cloth  6 each Topical Q0600  . darbepoetin (ARANESP) injection - DIALYSIS  100 mcg Subcutaneous Q Thu-HD  . [START ON 02/19/2020] feeding supplement (NEPRO CARB STEADY)  1,000 mL Oral Q24H  . feeding supplement (PRO-STAT SUGAR FREE 64)  30 mL Per Tube TID  . insulin aspart  0-15 Units Subcutaneous Q4H  . pantoprazole sodium  40 mg Per Tube BID  . PARoxetine  40 mg Per Tube Daily  . QUEtiapine  25 mg Per Tube BID  . sodium chloride flush  10-40 mL Intracatheter Q12H  . traZODone  25 mg Per Tube QHS   Continuous Infusions: . azithromycin 500 mg (02/18/20 0923)  . cefTRIAXone (ROCEPHIN)  IV 2 g (02/18/20 0845)   PRN Meds:.acetaminophen (TYLENOL) oral liquid 160 mg/5 mL, feeding supplement (NEPRO CARB STEADY), fentaNYL (SUBLIMAZE) injection, guaiFENesin-codeine, haloperidol lactate, HYDROcodone-acetaminophen, metoprolol tartrate, ondansetron (ZOFRAN) IV, phenol, promethazine, sodium chloride flush  Antimicrobials: Anti-infectives (From admission, onward)   Start     Dose/Rate Route Frequency Ordered Stop   02/16/20 1230  cefTRIAXone (ROCEPHIN) 2 g in sodium chloride 0.9 % 100 mL IVPB     2 g 200 mL/hr over 30 Minutes Intravenous Every 24 hours 02/16/20 1127 02/21/20 0959   02/16/20 1230  azithromycin (ZITHROMAX) 500 mg in sodium chloride 0.9 % 250 mL IVPB     500 mg 250 mL/hr over 60 Minutes Intravenous Every 24 hours 02/16/20 1127 02/21/20  6734   01/10/20 0930  vancomycin (VANCOCIN) IVPB 1000 mg/200 mL premix  Status:  Discontinued     1,000 mg 200 mL/hr over 60 Minutes Intravenous  Once 01/10/20 0915 01/13/20 0623   01/06/20 2200  ceFEPIme (MAXIPIME) 2 g in sodium chloride 0.9 % 100 mL IVPB     2 g 200 mL/hr over 30 Minutes Intravenous Every 24 hours 01/05/20 2251 01/11/20 2234   01/05/20 2200  ceFEPIme (MAXIPIME) 2 g in sodium chloride 0.9 % 100 mL IVPB     2 g 200 mL/hr over 30 Minutes  Intravenous  Once 01/05/20 2148 01/06/20 0015   01/05/20 2200  metroNIDAZOLE (FLAGYL) IVPB 500 mg     500 mg 100 mL/hr over 60 Minutes Intravenous  Once 01/05/20 2148 01/06/20 0015   01/05/20 2200  vancomycin (VANCOCIN) IVPB 1000 mg/200 mL premix  Status:  Discontinued     1,000 mg 200 mL/hr over 60 Minutes Intravenous  Once 01/05/20 2148 01/05/20 2151   01/05/20 2200  vancomycin (VANCOREADY) IVPB 2000 mg/400 mL  Status:  Discontinued     2,000 mg 200 mL/hr over 120 Minutes Intravenous  Once 01/05/20 2151 01/05/20 2223       I have personally reviewed the following labs and images: CBC: Recent Labs  Lab 02/13/20 0627 02/14/20 1209 02/16/20 1216 02/17/20 0316 02/18/20 0227  WBC 4.2 5.7 12.9* 11.1* 17.2*  HGB 8.4* 8.1* 9.8* 8.3* 8.3*  HCT 28.7* 27.2* 34.1* 28.5* 29.0*  MCV 93.5 95.4 95.3 95.3 96.3  PLT 131* 147* 161 140* 141*   BMP &GFR Recent Labs  Lab 02/12/20 0359 02/12/20 0359 02/13/20 0627 02/14/20 0852 02/16/20 1216 02/17/20 0316 02/18/20 0227  NA 133*   < > 137 134* 135 136 136  K 5.1   < > 4.1 4.2 4.2 3.6 3.0*  CL 93*   < > 95* 94* 95* 97* 95*  CO2 28   < > _0 GLUCOSE 163*   < > 119* 158* 188* 120* 126*  BUN 184*   < > 76* 107* 85* 105* 41*  CREATININE 2.62*   < > 1.49* 1.90* 1.87* 2.31* 1.58*  CALCIUM 8.7*   < > 8.8* 8.9 9.3 8.7* 8.3*  MG 2.8*  --  2.3 2.6*  --   --   --   PHOS 6.3*  --  4.1 4.6  --   --   --    < > = values in this interval not displayed.   Estimated Creatinine Clearance: 33.9 mL/min (A) (by C-G formula based on SCr of 1.58 mg/dL (H)). Liver & Pancreas: Recent Labs  Lab 02/12/20 0359 02/13/20 0627 02/14/20 0852  ALBUMIN 2.7* 2.7* 2.7*   No results for input(s): LIPASE, AMYLASE in the last 168 hours. No results for input(s): AMMONIA in the last 168 hours. Diabetic: No results for input(s): HGBA1C in the last 72 hours. Recent Labs  Lab 02/17/20 2026 02/17/20 2358 02/18/20 0451 02/18/20 0813 02/18/20 1115   GLUCAP 99 108* 134* 101* 173*   Cardiac Enzymes: No results for input(s): CKTOTAL, CKMB, CKMBINDEX, TROPONINI in the last 168 hours. No results for input(s): PROBNP in the last 8760 hours. Coagulation Profile: No results for input(s): INR, PROTIME in the last 168 hours. Thyroid Function Tests: No results for input(s): TSH, T4TOTAL, FREET4, T3FREE, THYROIDAB in the last 72 hours. Lipid Profile: No results for input(s): CHOL, HDL, LDLCALC, TRIG, CHOLHDL, LDLDIRECT in the last 72 hours. Anemia Panel: No results  for input(s): VITAMINB12, FOLATE, FERRITIN, TIBC, IRON, RETICCTPCT in the last 72 hours. Urine analysis:    Component Value Date/Time   COLORURINE AMBER (A) 01/13/2020 1302   APPEARANCEUR TURBID (A) 01/13/2020 1302   LABSPEC 1.018 01/13/2020 1302   PHURINE 5.0 01/13/2020 1302   GLUCOSEU NEGATIVE 01/13/2020 1302   HGBUR SMALL (A) 01/13/2020 1302   BILIRUBINUR NEGATIVE 01/13/2020 1302   KETONESUR NEGATIVE 01/13/2020 1302   PROTEINUR >=300 (A) 01/13/2020 1302   NITRITE NEGATIVE 01/13/2020 1302   LEUKOCYTESUR LARGE (A) 01/13/2020 1302   Sepsis Labs: Invalid input(s): PROCALCITONIN, [place]  Microbiology: Recent Results (from the past 240 hour(s))  Culture, blood (routine x 2) Call MD if unable to obtain prior to antibiotics being given     Status: None (Preliminary result)   Collection Time: 02/16/20 12:07 PM   Specimen: BLOOD LEFT HAND  Result Value Ref Range Status   Specimen Description BLOOD LEFT HAND  Final   Special Requests   Final    BOTTLES DRAWN AEROBIC AND ANAEROBIC Blood Culture results may not be optimal due to an inadequate volume of blood received in culture bottles Performed at [facility], [address] [place], [place] [address]    Culture NO GROWTH 2 DAYS  Final   Report Status PENDING  Incomplete  Culture, blood (routine x 2) Call MD if unable to obtain prior to antibiotics being given     Status: None (Preliminary result)   Collection  Time: 02/16/20 12:16 PM   Specimen: BLOOD LEFT HAND  Result Value Ref Range Status   Specimen Description BLOOD LEFT HAND  Final   Special Requests   Final    BOTTLES DRAWN AEROBIC AND ANAEROBIC Blood Culture adequate volume Performed at [facility], [place] [address] [place], [place] [address]    Culture NO GROWTH 2 DAYS  Final   Report Status PENDING  Incomplete    Radiology Studies: DG CHEST PORT 1 VIEW  Result Date: 02/18/2020 CLINICAL DATA:  Shortness of breath EXAM: PORTABLE CHEST 1 VIEW COMPARISON:  February 16, 2020 FINDINGS: There is a fairly sizable right pleural effusion with consolidation in portions of the right middle and lower lobes. There is consolidation in the medial left base. There is scarring in each apical region. Heart size is normal. Pulmonary vascularity is within normal limits. No adenopathy appreciable. Central catheter tip is at the cavoatrial junction. No evident pneumothorax. Bones osteoporotic. IMPRESSION: Fairly sizable right pleural effusion with consolidation involving much of the right middle and lower lobes. Consolidation medial left base. Suspect a degree of multifocal pneumonia. Stable scarring toward the apices on each side. Stable cardiac silhouette. Stable central catheter position. No pneumothorax. Electronically Signed   By: Lowella Grip III M.D.   On: 02/18/2020 09:37      Aurelio Mccamy T. [place]  If 7PM-7AM, please contact night-coverage [email or web address] Password [facility] 02/18/2020, 3:46 PM

## 2020-02-19 DIAGNOSIS — J9601 Acute respiratory failure with hypoxia: Secondary | ICD-10-CM | POA: Diagnosis not present

## 2020-02-19 DIAGNOSIS — N184 Chronic kidney disease, stage 4 (severe): Secondary | ICD-10-CM | POA: Diagnosis not present

## 2020-02-19 DIAGNOSIS — F339 Major depressive disorder, recurrent, unspecified: Secondary | ICD-10-CM | POA: Diagnosis not present

## 2020-02-19 DIAGNOSIS — J9611 Chronic respiratory failure with hypoxia: Secondary | ICD-10-CM | POA: Diagnosis not present

## 2020-02-19 DIAGNOSIS — N179 Acute kidney failure, unspecified: Secondary | ICD-10-CM | POA: Diagnosis not present

## 2020-02-19 DIAGNOSIS — Z515 Encounter for palliative care: Secondary | ICD-10-CM | POA: Diagnosis not present

## 2020-02-19 LAB — GLUCOSE, CAPILLARY
Glucose-Capillary: 113 mg/dL — ABNORMAL HIGH (ref 70–99)
Glucose-Capillary: 122 mg/dL — ABNORMAL HIGH (ref 70–99)
Glucose-Capillary: 131 mg/dL — ABNORMAL HIGH (ref 70–99)
Glucose-Capillary: 142 mg/dL — ABNORMAL HIGH (ref 70–99)
Glucose-Capillary: 143 mg/dL — ABNORMAL HIGH (ref 70–99)
Glucose-Capillary: 176 mg/dL — ABNORMAL HIGH (ref 70–99)

## 2020-02-19 LAB — HEPATIC FUNCTION PANEL
ALT: 17 U/L (ref 0–44)
AST: 20 U/L (ref 15–41)
Albumin: 2.4 g/dL — ABNORMAL LOW (ref 3.5–5.0)
Alkaline Phosphatase: 112 U/L (ref 38–126)
Bilirubin, Direct: <0.1 mg/dL (ref 0.0–0.2)
Total Bilirubin: 0.5 mg/dL (ref 0.3–1.2)
Total Protein: 6.1 g/dL — ABNORMAL LOW (ref 6.5–8.1)

## 2020-02-19 LAB — RENAL FUNCTION PANEL
Albumin: 2.5 g/dL — ABNORMAL LOW (ref 3.5–5.0)
Anion gap: 11 (ref 5–15)
BUN: 58 mg/dL — ABNORMAL HIGH (ref 8–23)
CO2: 27 mmol/L (ref 22–32)
Calcium: 8.6 mg/dL — ABNORMAL LOW (ref 8.9–10.3)
Chloride: 95 mmol/L — ABNORMAL LOW (ref 98–111)
Creatinine, Ser: 2.06 mg/dL — ABNORMAL HIGH (ref 0.44–1.00)
GFR calc Af Amer: 27 mL/min — ABNORMAL LOW (ref >60)
GFR calc non Af Amer: 24 mL/min — ABNORMAL LOW (ref >60)
Glucose, Bld: 125 mg/dL — ABNORMAL HIGH (ref 70–99)
Phosphorus: 2.3 mg/dL — ABNORMAL LOW (ref 2.5–4.6)
Potassium: 4 mmol/L (ref 3.5–5.1)
Sodium: 133 mmol/L — ABNORMAL LOW (ref 135–145)

## 2020-02-19 LAB — MAGNESIUM: Magnesium: 2.2 mg/dL (ref 1.7–2.4)

## 2020-02-19 LAB — CBC
HCT: 26.9 % — ABNORMAL LOW (ref 36.0–46.0)
Hemoglobin: 7.8 g/dL — ABNORMAL LOW (ref 12.0–15.0)
MCH: 27.6 pg (ref 26.0–34.0)
MCHC: 29 g/dL — ABNORMAL LOW (ref 30.0–36.0)
MCV: 95.1 fL (ref 80.0–100.0)
Platelets: 130 10*3/uL — ABNORMAL LOW (ref 150–400)
RBC: 2.83 MIL/uL — ABNORMAL LOW (ref 3.87–5.11)
RDW: 16.6 % — ABNORMAL HIGH (ref 11.5–15.5)
WBC: 13.8 10*3/uL — ABNORMAL HIGH (ref 4.0–10.5)
nRBC: 0 % (ref 0.0–0.2)

## 2020-02-19 MED ORDER — HEPARIN SODIUM (PORCINE) 1000 UNIT/ML DIALYSIS
40.0000 [IU]/kg | INTRAMUSCULAR | Status: DC | PRN
  Administered 2020-02-19: 3300 [IU] via INTRAVENOUS_CENTRAL

## 2020-02-19 MED ORDER — DARBEPOETIN ALFA 100 MCG/0.5ML IJ SOSY
PREFILLED_SYRINGE | INTRAMUSCULAR | Status: AC
  Filled 2020-02-19: qty 0.5

## 2020-02-19 NOTE — Progress Notes (Signed)
Renal Navigator observed patient while in HD unit during her treatment this afternoon. She was calm for the most part, with periods of soft moaning and crying and then calling out for help. RN stated that she had been given medication prior to getting to HD today. Patient appeared much calmer than Navigator observed last week, but she still reported to Navigator that she was not doing well. She appeared overly sad and depressed on treatment today, though appeared to be tolerating better than last treatment observed on 02/12/20. Situation discussed with CSW/I. Chasse. Renal Navigator continues to feel strongly that patient's family needs to be present with patient during HD treatment as patient remains miserable during treatment and behaviors are hard to manage at times. Patient refused HD in recliner and she remains inappropriate for referral for OP HD clinic referral.  Alphonzo Cruise, [place] Renal Navigator [phone]

## 2020-02-19 NOTE — Progress Notes (Signed)
PT Cancellation Note  Patient Details Name: Bailey Cox MRN: [id number] DOB: Jun 04, 1946   Cancelled Treatment:    Reason Eval/Treat Not Completed: Patient declined, no reason specified. PT reordered to assist with pt getting to recliner for HD. Palliative Care NP spoke with pt who agreed to get up to the chair if she could have medicine. Pt received medicine but adamantly refusing to get OOB to chair. Pt denied that she told NP she would get OOB. Pt has consistently refused PT and any mobility during her stay. Will once again sign off. Pt will need mechanical lift to chair regardless which can be performed by nursing.    Shary Decamp [facility] 02/19/2020, 11:52 AM [place] Pager [phone] Office [phone]

## 2020-02-19 NOTE — Progress Notes (Signed)
Late entry 1145, Primary RN, Lorenza Chick stated patient is refusing HD in recliner today.

## 2020-02-19 NOTE — Progress Notes (Signed)
Daily Progress Note   Patient Name: Bailey Cox       Date: 02/19/2020 DOB: 11-24-1945  Age: 74 y.o. MRN#: [id number] Attending Physician: Mercy Riding, MD Primary Care Physician: Patient, No Pcp Per Admit Date: 01/05/2020  Reason for Consultation/Follow-up: Establishing goals of care  Subjective: Patient wakes to voice. C/o of generalized pain and nausea this afternoon. Explained that I would have the nurse give her hydrocodone and zofran prior to getting into the recliner for dialysis. Patient initially tells me she does not want to get into recliner today and then tells me she will 'try' after she gets her medication. Explained importance of compliance with care plan to help facilitate discharge closer to her family sooner than later.   GOC:  F/u with daughter, [name] via telephone to discuss plan of care. Provided update and ongoing concerns with compliance with physical therapy and need for her mother to sit in recliner during dialysis (has refused today after I left the room earlier). [name] reports that her mother was told she would be required to sit in recliner on Saturday during dialysis and this may be why she is declining today. [name] shares that the patient's daughter [name]) plans to be at [facility] on Saturday to sit with [name] during dialysis. [name] is trying to make arrangements to sit with her mother on Tuesday during dialysis.   Discussed that her mother is still very weak and poorly participating with therapy. [name] shares that until her mother is stronger, the family would ideally still like to pursue SNF placement closer to [facility] and they can make arrangements to sit with her during dialysis.   Answered questions. [name] has PMT contact information and appreciative of  update.    Length of Stay: 45  Current Medications: Scheduled Meds:  . bisacodyl  10 mg Rectal Daily  . busPIRone  5 mg Per Tube BID  . calcium acetate  667 mg Oral TID WC  . camphor-menthol   Topical QID  . chlorhexidine gluconate (MEDLINE KIT)  15 mL Mouth Rinse BID  . Chlorhexidine Gluconate Cloth  6 each Topical Q0600  . darbepoetin (ARANESP) injection - DIALYSIS  100 mcg Subcutaneous Q Thu-HD  . feeding supplement (NEPRO CARB STEADY)  1,000 mL Oral Q24H  . feeding supplement (PRO-STAT SUGAR FREE 64)  30 mL  Per Tube TID  . insulin aspart  0-15 Units Subcutaneous Q4H  . pantoprazole sodium  40 mg Per Tube BID  . PARoxetine  40 mg Per Tube Daily  . QUEtiapine  25 mg Per Tube BID  . sodium chloride flush  10-40 mL Intracatheter Q12H  . traZODone  25 mg Per Tube QHS    Continuous Infusions: . azithromycin 500 mg (02/19/20 1131)  . cefTRIAXone (ROCEPHIN)  IV 2 g (02/19/20 1002)    PRN Meds: acetaminophen (TYLENOL) oral liquid 160 mg/5 mL, feeding supplement (NEPRO CARB STEADY), fentaNYL (SUBLIMAZE) injection, guaiFENesin-codeine, haloperidol lactate, HYDROcodone-acetaminophen, metoprolol tartrate, ondansetron (ZOFRAN) IV, phenol, promethazine, sodium chloride flush  Physical Exam Vitals and nursing note reviewed.  Constitutional:      Appearance: She is ill-appearing.  HENT:     Head: Normocephalic and atraumatic.  Pulmonary:     Effort: No tachypnea, accessory muscle usage or respiratory distress.  Skin:    General: Skin is warm and dry.     Coloration: Skin is pale.  Neurological:     Mental Status: She is easily aroused.     Comments: Alert. Oriented to person/place.   Psychiatric:        Mood and Affect: Affect is flat.            Vital Signs: BP (!) 120/52   Pulse 84   Temp 98.4 F (36.9 C) (Oral)   Resp 16   Ht '5\' 4"'$  (1.626 m)   Wt 84.7 kg   LMP  (LMP Unknown)   SpO2 100%   BMI 32.05 kg/m  SpO2: SpO2: 100 % O2 Device: O2 Device: High Flow Nasal  Cannula O2 Flow Rate: O2 Flow Rate (L/min): 3 L/min  Intake/output summary:   Intake/Output Summary (Last 24 hours) at 02/19/2020 1206 Last data filed at 02/18/2020 1800 Gross per 24 hour  Intake 200 ml  Output 1 ml  Net 199 ml   LBM: Last BM Date: 02/18/20 Baseline Weight: Weight: 113 kg Most recent weight: Weight: 84.7 kg       Palliative Assessment/Data: PPS 40%    Flowsheet Rows     Most Recent Value  Intake Tab  Referral Department  Hospitalist  Unit at Time of Referral  Med/Surg Unit  Palliative Care Primary Diagnosis  Nephrology  Date Notified  01/29/20  Palliative Care Type  New Palliative care  Reason for referral  Clarify Goals of Care  Date of Admission  01/05/20  Date first seen by Palliative Care  02/02/20  # of days Palliative referral response time  4 Day(s)  # of days IP prior to Palliative referral  24  Clinical Assessment  Psychosocial & Spiritual Assessment  Palliative Care Outcomes      Patient Active Problem List   Diagnosis Date Noted  . Pressure injury of skin 02/17/2020  . Palliative care by specialist   . Weakness   . Depression, recurrent ([place])   . Abdominal pain   . Goals of care, counseling/discussion   . Acute hypoxemic respiratory failure ([place])   . Acute kidney injury ([place])   . Anemia due to stage 4 chronic kidney disease ([place])   . Hypotension   . Sepsis ([place])   . Renal failure 01/06/2020  . Tracheostomy status ([place]) 01/06/2020  . Chronic hypoxemic respiratory failure ([place]) 01/06/2020  . Shock ([place]) 01/05/2020    Palliative Care Assessment & Plan   Patient Profile: 74 y.o. female  with past medical history of cholecystectomy with CBD  stenting in September followed by PEA arrest during endoscopy that lead to hospitalization from December 2020 until January 05, 2020 at [facility] 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 She was discharged to  [facility] on 3/1 who immediately sent her to [facility].  On admission 01/05/2020 she was found to have acidosis with acute on chronic kidney failure.  Unfortunately she has not had renal recovery.  Assessment: AKI on CKD stage IV MRSA pneumonia Chronic respiratory, now decannulated DM type II Essential hypertension Anemia of CKD Anxiety Depression  Recommendations/Plan:  Continue full code/full scope treatment following ethics consultation. Please review Dr. Lowella Bandy note 02/10/20.  Continue PT/OT/SLP efforts  Recommend attempting iPAD video chats with family when they are unavailable to visit [facility] during the week.   Manage pain/nausea/anxiety during dialysis. PRN's available.   MOST form completed with patient/family current wishes for FULL code/FULL scope treatment.   Continue hemodialysis. Daughter [name]) visiting Saturday to sit with her during HD and daughter [name]) making arrangements to sit with her Tuesday during dialysis.    Goals of Care and Additional Recommendations:  Limitations on Scope of Treatment: Full Scope Treatment  Code Status: FULL   Code Status Orders  (From admission, onward)         Start     Ordered   01/05/20 2350  Full code  Continuous     01/05/20 2352        Code Status History    This patient has a current code status but no historical code status.   Advance Care Planning Activity       Prognosis:  Unable to determine  Discharge Planning:   To Be Determined  Care plan was discussed with RN, daughter, patient, PT  Thank you for allowing the Palliative Medicine Team to assist in the care of this patient.   Time In: 1150- Time Out: 1225 Total Time 35 Prolonged Time Billed no      Greater than 50% of this time was spent counseling and coordinating care related to the above assessment and plan.   Ihor Dow, DNP, FNP-C Palliative Medicine Team  Phone: [phone] Fax: [phone]  Please contact Palliative Medicine  Team phone at [phone] for questions and concerns.

## 2020-02-19 NOTE — TOC Progression Note (Signed)
Transition of Care [facility]) - Progression Note    Patient Details  Name: Bailey Cox MRN: [id number] Date of Birth: [birth year]  Transition of Care [facility]) CM/SW [place], [place] Phone Number: 02/19/2020, 3:39 PM  Clinical Narrative:    Pt has transferred from 6N to 4E, CSW remains as support for pt disposition due to precense at ethics meeting and awareness of barriers to discharge. Pt still needs to be agreeable and able to tolerate regular dialysis in recliner to discharge to SNF. CSW has spoken about this also with renal navigator [place].    Expected Discharge Plan: Skilled Nursing Facility Barriers to Discharge: Continued Medical Work up, Other (comment)(ability to tolerate dialysis in recliner)  Expected Discharge Plan and Services Expected Discharge Plan: [place] In-house Referral: Clinical Social Work Discharge Planning Services: CM Consult Post Acute Care Choice: [place] Living arrangements for the past 2 months: (Has been hospitalized for over 4 months)   Readmission Risk Interventions Readmission Risk Prevention Plan 01/28/2020  Transportation Screening Complete  PCP or Specialist Appt within 3-5 Days Not Complete  Not Complete comments plan for SNF  HRI or [place] Not Complete  HRI or Home Care Consult comments plan for SNF  Social Work Consult for [place] Planning/Counseling Complete  Palliative Care Screening Complete  Medication Review Press photographer) Referral to Pharmacy  PCP or Specialist appointment within 3-5 days of discharge Not Complete  PCP/Specialist Appt Not Complete comments plan for SNF  HRI or Home Care Consult Complete  SW Recovery Care/Counseling Consult Complete  Palliative Care Screening Not Complete  Comments appropriate at this time  [place] Complete

## 2020-02-19 NOTE — Progress Notes (Signed)
Patient ID: Bailey Cox, female   DOB: 1946/01/03, 74 y.o.   MRN: [id number] [facility] Progress Note   Assessment/ Plan:   1. Acute kidney Injury on chronic kidney disease stage IV: Suspected acute kidney injury is from sepsis/ATN without any evidence of renal recovery to date.  Previously on CRRT beginning 3/7 and later transitioned to intermittent hemodialysis.  She has had difficulty with willing to undertake dialysis as well as anxiety during the dialysis treatment itself for which she is premedicated.  She will need to demonstrate ability to sit in a recliner prior to her successful placement to outpatient dialysis.  Her daughter informs me that she and her siblings are going to be sitting at her side at the outpatient dialysis unit (in [place], [place]). 2.  Sepsis/MRSA pneumonia: With increasing work of breathing/hypoxia on Monday which was attributed to presence of new infiltrate/pneumonia for which she is now on ceftriaxone and azithromycin.  She is afebrile. 3.  History of PEA arrest status post tracheostomy; decannulated on 3/19 and currently on treatment with pneumonia/oxygen supplementation. 4.  Anemia: Secondary to chronic illness exacerbated by recent hospitalization-adequate iron stores and now on ESA. 5.  Disposition: Difficult situation, appreciate input from palliative care service and will continue to provide dialysis/care.  Subjective:   Complains of some shortness of breath this morning "after they changed the nasal cannula".   Objective:   BP 126/68 (BP Location: Left Wrist)   Pulse 85   Temp 98.4 F (36.9 C) (Oral)   Resp 19   Ht '5\' 4"'$  (1.626 m)   Wt 84.7 kg   LMP  (LMP Unknown)   SpO2 100%   BMI 32.05 kg/m   Intake/Output Summary (Last 24 hours) at 02/19/2020 1013 Last data filed at 02/18/2020 1800 Gross per 24 hour  Intake 479 ml  Output 99 ml  Net 380 ml   Weight change: -1.7 kg  Physical Exam: Gen: Appears somewhat restless laying  in bed, easy to redirect. CVS: Pulse regular rhythm, normal rate, S1 and S2 normal.  Tracheostomy site intact dressing. Resp: Diminished breath sounds over bases without distinct rales or rhonchi, right IJ TDC. Abd: Soft, obese, nontender Ext: Trace - 1+ lower extremity edema  Imaging: DG CHEST PORT 1 VIEW  Result Date: 02/18/2020 CLINICAL DATA:  Shortness of breath EXAM: PORTABLE CHEST 1 VIEW COMPARISON:  February 16, 2020 FINDINGS: There is a fairly sizable right pleural effusion with consolidation in portions of the right middle and lower lobes. There is consolidation in the medial left base. There is scarring in each apical region. Heart size is normal. Pulmonary vascularity is within normal limits. No adenopathy appreciable. Central catheter tip is at the cavoatrial junction. No evident pneumothorax. Bones osteoporotic. IMPRESSION: Fairly sizable right pleural effusion with consolidation involving much of the right middle and lower lobes. Consolidation medial left base. Suspect a degree of multifocal pneumonia. Stable scarring toward the apices on each side. Stable cardiac silhouette. Stable central catheter position. No pneumothorax. Electronically Signed   By: Lowella Grip III M.D.   On: 02/18/2020 09:37    Labs: BMET Recent Labs  Lab 02/13/20 0627 02/14/20 0852 02/16/20 1216 02/17/20 0316 02/18/20 0227 02/19/20 0413  NA 137 134* 135 136 136 133*  K 4.1 4.2 4.2 3.6 3.0* 4.0  CL 95* 94* 95* 97* 95* 95*  CO2 '30 29 26 28 30 27  '$ GLUCOSE 119* 158* 188* 120* 126* 125*  BUN 76* 107* 85* 105* 41* 58*  CREATININE  1.49* 1.90* 1.87* 2.31* 1.58* 2.06*  CALCIUM 8.8* 8.9 9.3 8.7* 8.3* 8.6*  PHOS 4.1 4.6  --   --   --  2.3*   CBC Recent Labs  Lab 02/16/20 1216 02/17/20 0316 02/18/20 0227 02/19/20 0413  WBC 12.9* 11.1* 17.2* 13.8*  HGB 9.8* 8.3* 8.3* 7.8*  HCT 34.1* 28.5* 29.0* 26.9*  MCV 95.3 95.3 96.3 95.1  PLT 161 140* 141* 130*    Medications:    . bisacodyl  10 mg Rectal  Daily  . busPIRone  5 mg Per Tube BID  . calcium acetate  667 mg Oral TID WC  . camphor-menthol   Topical QID  . chlorhexidine gluconate (MEDLINE KIT)  15 mL Mouth Rinse BID  . Chlorhexidine Gluconate Cloth  6 each Topical Q0600  . darbepoetin (ARANESP) injection - DIALYSIS  100 mcg Subcutaneous Q Thu-HD  . feeding supplement (NEPRO CARB STEADY)  1,000 mL Oral Q24H  . feeding supplement (PRO-STAT SUGAR FREE 64)  30 mL Per Tube TID  . insulin aspart  0-15 Units Subcutaneous Q4H  . pantoprazole sodium  40 mg Per Tube BID  . PARoxetine  40 mg Per Tube Daily  . QUEtiapine  25 mg Per Tube BID  . sodium chloride flush  10-40 mL Intracatheter Q12H  . traZODone  25 mg Per Tube QHS   Elmarie Shiley, MD 02/19/2020, 10:13 AM

## 2020-02-19 NOTE — Plan of Care (Signed)
Continue to monitor

## 2020-02-19 NOTE — Progress Notes (Signed)
SLP Cancellation Note  Patient Details Name: Bailey Cox MRN: [id number] DOB: 03-01-1946   Cancelled treatment:       Reason Eval/Treat Not Completed: Patient at procedure or test/unavailable(Pt having dialysis at this time. SLP will follow up. )  Tammi Boulier I. Hardin Negus, [place], [place] Office number [phone] Pager [place] 02/19/2020, 2:44 PM

## 2020-02-19 NOTE — Progress Notes (Signed)
PROGRESS NOTE  Bailey Cox PVF:[id number] DOB: 11-10-1945   PCP: Patient, No Pcp Per  Patient is from: Home  DOA: 01/05/2020 LOS: 34  Brief Narrative / Interim history: 74 year old female with history of cholecystitis status post cholecystectomy in 9/20 at [facility].  She was discharged to SNF but rehospitalized to [facility] and had bile duct stent.  Discharged back to SNF but readmitted with Ogilive syndrome.  Hospital course complicated by fluid overload and transferred to [facility] for dialysis.  At [facility], developed respiratory failure requiring vent support.  She underwent trach.she was eventually discharged to [facility] on 3/1 after prolonged hospitalization at [facility] but deemed too sick, and sent to [facility] due to fluid overload and acidosis.  Patient was initially admitted to ICU.  Required CRRT 3/9-3/12.  Transfer to floor on 3/14.  She pulled her trach.  Now on oxygen by nasal cannula.  She is on IHD via [place].  Has refused dialysis at times but deemed to lack capacity.  Family wants full scope of care including full code and dialysis.  Ethics committee meeting held on 4/6.   Subjective: Seen and examined earlier this morning.  No major events overnight or this morning.  Likes to be left alone so that she could sleep.  She mentioned "stomach pain" but also states she is alright.  Does not appear to be in distress.  Objective: Vitals:   02/19/20 0900 02/19/20 1000 02/19/20 1100 02/19/20 1200  BP: (!) 132/49 (!) 131/96  (!) 120/52  Pulse: 86 86 87 84  Resp: _0 Temp:      TempSrc:      SpO2: 100% 100% 100% 100%  Weight:      Height:        Intake/Output Summary (Last 24 hours) at 02/19/2020 1206 Last data filed at 02/18/2020 1800 Gross per 24 hour  Intake 200 ml  Output 1 ml  Net 199 ml   Filed Weights   02/17/20 1120 02/18/20 0000 02/19/20 0444  Weight: 81 kg 82.2 kg 84.7 kg    Examination:  GENERAL: No apparent distress.  Nontoxic. HEENT: MMM.   Vision and hearing grossly intact.  NECK: Supple.  No apparent JVD.  RESP: 100% on 3 L.  No IWOB.  Fair aeration bilaterally. CVS:  RRR. Heart sounds normal.  ABD/GI/GU: Bowel sounds present. Soft.  Diffuse tenderness.  PEG tube. MSK/EXT:  Moves extremities. No apparent deformity.  Trace edema mainly in upper extremities. SKIN: no apparent skin lesion or wound NEURO: A Sleepy but wakes to voice easily.  No apparent focal neuro deficit. PSYCH: Calm.  Flat affect.  Procedures:  3/19-self decannulated  Microbiology summarized: 3/1-COVID-19 and influenza PCR negative 3/1-urine culture negative 3/1-blood cultures negative 3/2-MRSA PCR positive 3/4-respiratory culture with pansensitive Pseudomonas aeruginosa 4/12-blood cultures negative  Assessment & Plan: Acute kidney injury on chronic kidney disease, stage IV-likely due to ATN from sepsis -Nephrology managing-on IHD -Per nephrology, she needs to tolerate HD in recliner chair for outpatient HD.  Aspiration pneumonia 02/16/2020-respiratory failure improving. -CXR on 4/14 with RML, RLL and Left mid lung opacities concerning for multifocal pneumonia -Continue CTX and azithromycin 4/12>> -Aspiration precautions. -Trend leukocytosis -Intermittent CXR.  Acute on chronic hypoxic respiratory failure-likely due to aspiration pneumonia and fluid overload.  Down to 3 L by [place]. -Self decannulated on 3/19 -Manage pneumonia as above -Wean oxygen as able  MRSA/Pseudomonas aeruginosa pneumonia-previously completed antibiotic course for this.  DM-2 with hyperglycemia: A1c 5.7%. Recent Labs  02/19/20 0028 02/19/20 0441 02/19/20 0758  GLUCAP 122* 113* 131*  -Continue current regimen  Essential hypertension: Normotensive -Continue PRN metoprolol  Anemia of chronic kidney disease -ESA and IV iron per nephrology  Hypokalemia: Resolved. -Continue monitoring  Hyponatremia: Relatively stable.  Anxiety/depression Agitation  -Continue Paxil, BuSpar, trazodone -Frequent orientation and delirium precautions -As needed Haldol and scheduled Seroquel.   GERD -Continue PPI  Constipation -Bowel regimen  Mild thrombocytopenia: Stable. -Continue monitoring  Generalized weakness/debility -PT/OT-not able to participate in therapy.  Requiring mechanical lift to chair.  Goals of care/Ethical issues-complex medical issue with guarded prognosis.  Daughter wishes her to be full code with full scope of care including dialysis.  Patient refused HD previously but deemed to lack capacity by psych.  Requiring chemical restraints and benzodiazepines to comply with dialysis which would be difficult with outpatient dialysis. -Palliative care following -Ethics involved previously.  Nutritional support -Appreciate dietitian guidance -Continue tube feed via PEG. Nutrition Problem: Inadequate oral intake Etiology: inability to eat  Signs/Symptoms: NPO status  Interventions: Tube feeding   Pressure Injury 02/16/20 Coccyx Stage 1 -  Intact skin with non-blanchable redness of a localized area usually over a bony prominence. (Active)  02/16/20 1700  Location: Coccyx  Location Orientation:   Staging: Stage 1 -  Intact skin with non-blanchable redness of a localized area usually over a bony prominence.  Wound Description (Comments):   Present on Admission:      DVT prophylaxis: Start subcu heparin Code Status: Full code Family Communication: None at bedside.  Updated patient's daughter at bedside on 4/14.  Discharge barrier: Aspiration pneumonia requiring IV antibiotics, AKI/fluid overload and acute respiratory failure Patient is from: [facility] Final disposition: Likely back to [facility] once stabilized and cleared by consultants.  Needs to tolerate HD in recliner chair for outpatient HD.  Consultants:  PCCM (off) Nephrology   Sch Meds:  Scheduled Meds: . bisacodyl  10 mg Rectal Daily  .  busPIRone  5 mg Per Tube BID  . calcium acetate  667 mg Oral TID WC  . camphor-menthol   Topical QID  . chlorhexidine gluconate (MEDLINE KIT)  15 mL Mouth Rinse BID  . Chlorhexidine Gluconate Cloth  6 each Topical Q0600  . darbepoetin (ARANESP) injection - DIALYSIS  100 mcg Subcutaneous Q Thu-HD  . feeding supplement (NEPRO CARB STEADY)  1,000 mL Oral Q24H  . feeding supplement (PRO-STAT SUGAR FREE 64)  30 mL Per Tube TID  . insulin aspart  0-15 Units Subcutaneous Q4H  . pantoprazole sodium  40 mg Per Tube BID  . PARoxetine  40 mg Per Tube Daily  . QUEtiapine  25 mg Per Tube BID  . sodium chloride flush  10-40 mL Intracatheter Q12H  . traZODone  25 mg Per Tube QHS   Continuous Infusions: . azithromycin 500 mg (02/19/20 1131)  . cefTRIAXone (ROCEPHIN)  IV 2 g (02/19/20 1002)   PRN Meds:.acetaminophen (TYLENOL) oral liquid 160 mg/5 mL, feeding supplement (NEPRO CARB STEADY), fentaNYL (SUBLIMAZE) injection, guaiFENesin-codeine, haloperidol lactate, HYDROcodone-acetaminophen, metoprolol tartrate, ondansetron (ZOFRAN) IV, phenol, promethazine, sodium chloride flush  Antimicrobials: Anti-infectives (From admission, onward)   Start     Dose/Rate Route Frequency Ordered Stop   02/16/20 1230  cefTRIAXone (ROCEPHIN) 2 g in sodium chloride 0.9 % 100 mL IVPB     2 g 200 mL/hr over 30 Minutes Intravenous Every 24 hours 02/16/20 1127 02/21/20 0959   02/16/20 1230  azithromycin (ZITHROMAX) 500 mg in sodium chloride 0.9 % 250 mL  IVPB     500 mg 250 mL/hr over 60 Minutes Intravenous Every 24 hours 02/16/20 1127 02/21/20 0959   01/10/20 0930  vancomycin (VANCOCIN) IVPB 1000 mg/200 mL premix  Status:  Discontinued     1,000 mg 200 mL/hr over 60 Minutes Intravenous  Once 01/10/20 0915 01/13/20 0623   01/06/20 2200  ceFEPIme (MAXIPIME) 2 g in sodium chloride 0.9 % 100 mL IVPB     2 g 200 mL/hr over 30 Minutes Intravenous Every 24 hours 01/05/20 2251 01/11/20 2234   01/05/20 2200  ceFEPIme (MAXIPIME) 2  g in sodium chloride 0.9 % 100 mL IVPB     2 g 200 mL/hr over 30 Minutes Intravenous  Once 01/05/20 2148 01/06/20 0015   01/05/20 2200  metroNIDAZOLE (FLAGYL) IVPB 500 mg     500 mg 100 mL/hr over 60 Minutes Intravenous  Once 01/05/20 2148 01/06/20 0015   01/05/20 2200  vancomycin (VANCOCIN) IVPB 1000 mg/200 mL premix  Status:  Discontinued     1,000 mg 200 mL/hr over 60 Minutes Intravenous  Once 01/05/20 2148 01/05/20 2151   01/05/20 2200  vancomycin (VANCOREADY) IVPB 2000 mg/400 mL  Status:  Discontinued     2,000 mg 200 mL/hr over 120 Minutes Intravenous  Once 01/05/20 2151 01/05/20 2223       I have personally reviewed the following labs and images: CBC: Recent Labs  Lab 02/14/20 1209 02/16/20 1216 02/17/20 0316 02/18/20 0227 02/19/20 0413  WBC 5.7 12.9* 11.1* 17.2* 13.8*  HGB 8.1* 9.8* 8.3* 8.3* 7.8*  HCT 27.2* 34.1* 28.5* 29.0* 26.9*  MCV 95.4 95.3 95.3 96.3 95.1  PLT 147* 161 140* 141* 130*   BMP &GFR Recent Labs  Lab 02/13/20 0627 02/13/20 0627 02/14/20 0852 02/16/20 1216 02/17/20 0316 02/18/20 0227 02/19/20 0413  NA 137   < > 134* 135 136 136 133*  K 4.1   < > 4.2 4.2 3.6 3.0* 4.0  CL 95*   < > 94* 95* 97* 95* 95*  CO2 30   < > _0 GLUCOSE 119*   < > 158* 188* 120* 126* 125*  BUN 76*   < > 107* 85* 105* 41* 58*  CREATININE 1.49*   < > 1.90* 1.87* 2.31* 1.58* 2.06*  CALCIUM 8.8*   < > 8.9 9.3 8.7* 8.3* 8.6*  MG 2.3  --  2.6*  --   --   --  2.2  PHOS 4.1  --  4.6  --   --   --  2.3*   < > = values in this interval not displayed.   Estimated Creatinine Clearance: 26.4 mL/min (A) (by C-G formula based on SCr of 2.06 mg/dL (H)). Liver & Pancreas: Recent Labs  Lab 02/13/20 0627 02/14/20 0852 02/19/20 0413  AST  --   --  20  ALT  --   --  17  ALKPHOS  --   --  112  BILITOT  --   --  0.5  PROT  --   --  6.1*  ALBUMIN 2.7* 2.7* 2.4*  2.5*   No results for input(s): LIPASE, AMYLASE in the last 168 hours. No results for input(s): AMMONIA  in the last 168 hours. Diabetic: No results for input(s): HGBA1C in the last 72 hours. Recent Labs  Lab 02/18/20 1702 02/18/20 2015 02/19/20 0028 02/19/20 0441 02/19/20 0758  GLUCAP 131* 148* 122* 113* 131*   Cardiac Enzymes: No results for input(s): CKTOTAL, CKMB, CKMBINDEX, TROPONINI  in the last 168 hours. No results for input(s): PROBNP in the last 8760 hours. Coagulation Profile: No results for input(s): INR, PROTIME in the last 168 hours. Thyroid Function Tests: No results for input(s): TSH, T4TOTAL, FREET4, T3FREE, THYROIDAB in the last 72 hours. Lipid Profile: No results for input(s): CHOL, HDL, LDLCALC, TRIG, CHOLHDL, LDLDIRECT in the last 72 hours. Anemia Panel: No results for input(s): VITAMINB12, FOLATE, FERRITIN, TIBC, IRON, RETICCTPCT in the last 72 hours. Urine analysis:    Component Value Date/Time   COLORURINE AMBER (A) 01/13/2020 1302   APPEARANCEUR TURBID (A) 01/13/2020 1302   LABSPEC 1.018 01/13/2020 1302   PHURINE 5.0 01/13/2020 1302   GLUCOSEU NEGATIVE 01/13/2020 1302   HGBUR SMALL (A) 01/13/2020 1302   BILIRUBINUR NEGATIVE 01/13/2020 1302   KETONESUR NEGATIVE 01/13/2020 1302   PROTEINUR >=300 (A) 01/13/2020 1302   NITRITE NEGATIVE 01/13/2020 1302   LEUKOCYTESUR LARGE (A) 01/13/2020 1302   Sepsis Labs: Invalid input(s): PROCALCITONIN, [place]  Microbiology: Recent Results (from the past 240 hour(s))  Culture, blood (routine x 2) Call MD if unable to obtain prior to antibiotics being given     Status: None (Preliminary result)   Collection Time: 02/16/20 12:07 PM   Specimen: BLOOD LEFT HAND  Result Value Ref Range Status   Specimen Description BLOOD LEFT HAND  Final   Special Requests   Final    BOTTLES DRAWN AEROBIC AND ANAEROBIC Blood Culture results may not be optimal due to an inadequate volume of blood received in culture bottles Performed at [facility], [address], [place], [place] [address]    Culture NO GROWTH 2 DAYS   Final   Report Status PENDING  Incomplete  Culture, blood (routine x 2) Call MD if unable to obtain prior to antibiotics being given     Status: None (Preliminary result)   Collection Time: 02/16/20 12:16 PM   Specimen: BLOOD LEFT HAND  Result Value Ref Range Status   Specimen Description BLOOD LEFT HAND  Final   Special Requests   Final    BOTTLES DRAWN AEROBIC AND ANAEROBIC Blood Culture adequate volume Performed at [facility], [place] [address] [place], [place] [address]    Culture NO GROWTH 2 DAYS  Final   Report Status PENDING  Incomplete    Radiology Studies: No results found.    Laprecious Austill T. [place]  If 7PM-7AM, please contact night-coverage [email or web address] Password [facility] 02/19/2020, 12:06 PM

## 2020-02-19 NOTE — Progress Notes (Signed)
Patient off floor to dialysis

## 2020-02-20 DIAGNOSIS — N179 Acute kidney failure, unspecified: Secondary | ICD-10-CM | POA: Diagnosis not present

## 2020-02-20 DIAGNOSIS — R1312 Dysphagia, oropharyngeal phase: Secondary | ICD-10-CM

## 2020-02-20 DIAGNOSIS — J9601 Acute respiratory failure with hypoxia: Secondary | ICD-10-CM | POA: Diagnosis not present

## 2020-02-20 DIAGNOSIS — N184 Chronic kidney disease, stage 4 (severe): Secondary | ICD-10-CM | POA: Diagnosis not present

## 2020-02-20 DIAGNOSIS — J9611 Chronic respiratory failure with hypoxia: Secondary | ICD-10-CM | POA: Diagnosis not present

## 2020-02-20 DIAGNOSIS — G894 Chronic pain syndrome: Secondary | ICD-10-CM

## 2020-02-20 LAB — GLUCOSE, CAPILLARY
Glucose-Capillary: 141 mg/dL — ABNORMAL HIGH (ref 70–99)
Glucose-Capillary: 176 mg/dL — ABNORMAL HIGH (ref 70–99)
Glucose-Capillary: 194 mg/dL — ABNORMAL HIGH (ref 70–99)
Glucose-Capillary: 194 mg/dL — ABNORMAL HIGH (ref 70–99)
Glucose-Capillary: 205 mg/dL — ABNORMAL HIGH (ref 70–99)
Glucose-Capillary: 214 mg/dL — ABNORMAL HIGH (ref 70–99)

## 2020-02-20 LAB — RENAL FUNCTION PANEL
Albumin: 2.4 g/dL — ABNORMAL LOW (ref 3.5–5.0)
Anion gap: 16 — ABNORMAL HIGH (ref 5–15)
BUN: 36 mg/dL — ABNORMAL HIGH (ref 8–23)
CO2: 24 mmol/L (ref 22–32)
Calcium: 8.7 mg/dL — ABNORMAL LOW (ref 8.9–10.3)
Chloride: 95 mmol/L — ABNORMAL LOW (ref 98–111)
Creatinine, Ser: 1.48 mg/dL — ABNORMAL HIGH (ref 0.44–1.00)
GFR calc Af Amer: 41 mL/min — ABNORMAL LOW (ref >60)
GFR calc non Af Amer: 35 mL/min — ABNORMAL LOW (ref >60)
Glucose, Bld: 214 mg/dL — ABNORMAL HIGH (ref 70–99)
Phosphorus: 2.1 mg/dL — ABNORMAL LOW (ref 2.5–4.6)
Potassium: 3.6 mmol/L (ref 3.5–5.1)
Sodium: 135 mmol/L (ref 135–145)

## 2020-02-20 LAB — CBC
HCT: 25 % — ABNORMAL LOW (ref 36.0–46.0)
Hemoglobin: 7.4 g/dL — ABNORMAL LOW (ref 12.0–15.0)
MCH: 28 pg (ref 26.0–34.0)
MCHC: 29.6 g/dL — ABNORMAL LOW (ref 30.0–36.0)
MCV: 94.7 fL (ref 80.0–100.0)
Platelets: UNDETERMINED 10*3/uL (ref 150–400)
RBC: 2.64 MIL/uL — ABNORMAL LOW (ref 3.87–5.11)
RDW: 17 % — ABNORMAL HIGH (ref 11.5–15.5)
WBC: 8.6 10*3/uL (ref 4.0–10.5)
nRBC: 0.5 % — ABNORMAL HIGH (ref 0.0–0.2)

## 2020-02-20 LAB — MAGNESIUM: Magnesium: 1.9 mg/dL (ref 1.7–2.4)

## 2020-02-20 MED ORDER — NEPRO/CARBSTEADY PO LIQD
1000.0000 mL | ORAL | Status: DC
  Administered 2020-02-20 – 2020-03-19 (×23): 1000 mL
  Filled 2020-02-20 (×33): qty 1000

## 2020-02-20 MED ORDER — DARBEPOETIN ALFA 200 MCG/0.4ML IJ SOSY: 200.0000 ug | PREFILLED_SYRINGE | INTRAMUSCULAR | Status: DC

## 2020-02-20 MED ORDER — ACETAMINOPHEN 160 MG/5ML PO SOLN
1000.0000 mg | Freq: Three times a day (TID) | ORAL | Status: DC
  Administered 2020-02-20 – 2020-03-21 (×86): 1000 mg
  Filled 2020-02-20 (×84): qty 40.6

## 2020-02-20 NOTE — Progress Notes (Signed)
Palliative Medicine RN Note: Rec'd a call from Joaquim Nam [phone]). She asked if her sister had permission to stay overnight with Mrs Faires.  I called the unit and spoke with Elmon Else. We discussed Jadyn and her anxiety, as well as the policy of allowing a 24 hour support person for people who are unable to make their own decisions. He stated that it would be helpful for Liley to have that support and that it would be ok for her to stay.  I called Sandy back to let her know. She asked about switching out family members staying overnight to ensure presence for HD/travelling from out of town. I let her know that was not something I could approve and she needs to discuss that with the unit when they arrive to visit. Marieelena is scheduled for HD tomorrow.  Marjie Skiff Yvette Roark, RN, BSN, [facility] Palliative Medicine Team 02/20/2020 3:20 PM Office [phone]

## 2020-02-20 NOTE — Progress Notes (Signed)
RT NOTE: RT came to patient's room to perform scheduled CPT. This RT introduced herself and explained that it was time for her CPT. Patient told RT she did not want to do CPT and asked RT to leave her room. RT asked patient if she would work on her flutter valve and patient also refused, asking RT again to leave. Vitals are stable. RT will continue to monitor.

## 2020-02-20 NOTE — Progress Notes (Signed)
PROGRESS NOTE  Bailey Cox ZOX:[id number] DOB: [birth year]   PCP: Patient, No Pcp Per  Patient is from: Home  DOA: 01/05/2020 LOS: 102  Brief Narrative / Interim history: 74 year old female with history of cholecystitis status post cholecystectomy in 9/20 at [facility].  She was discharged to SNF but rehospitalized to [facility] and had bile duct stent.  Discharged back to SNF but readmitted with Ogilive syndrome.  Hospital course complicated by fluid overload and transferred to [facility] for dialysis.  At [facility], developed respiratory failure requiring vent support.  She underwent trach.she was eventually discharged to [facility] on 3/1 after prolonged hospitalization at [facility] but deemed too sick, and sent to [facility] due to fluid overload and acidosis.  Patient was initially admitted to ICU.  Required CRRT 3/9-3/12.  Transfer to floor on 3/14.  She pulled her trach.  Now on oxygen by nasal cannula.  She is on IHD via [place].  Has refused dialysis at times but deemed to lack capacity.  Family wants full scope of care including full code and dialysis.  Ethics committee meeting held on 4/6.  Remains inpatient until she is able to tolerate HD on recliner chair and has outpatient HD spot .  Subjective: Seen and examined earlier this morning.  No major events overnight of this morning.  Reports pain all over.  Asking for pain medications.  Objective: Vitals:   02/20/20 0402 02/20/20 0751 02/20/20 0900 02/20/20 1200  BP: 140/60 (!) 132/55  (!) 132/59  Pulse: 95 96  88  Resp: 20 (!) '27 19 20  '$ Temp: 99.4 F (37.4 C) 98.9 F (37.2 C)  99 F (37.2 C)  TempSrc: Oral Oral  Oral  SpO2: 96% 92%  96%  Weight:      Height:        Intake/Output Summary (Last 24 hours) at 02/20/2020 1332 Last data filed at 02/20/2020 0653 Gross per 24 hour  Intake 980 ml  Output 1000 ml  Net -20 ml   Filed Weights   02/19/20 0444 02/19/20 1415 02/19/20 1750  Weight: 84.7 kg 85.1 kg 84.2 kg     Examination:  GENERAL: No acute distress.  Appears well.  HEENT: MMM.  Vision and hearing grossly intact.  NECK: Supple.  No apparent JVD.  RESP: 96% on 3 L.  No IWOB.  Fair aeration bilaterally. CVS:  RRR. Heart sounds normal.  ABD/GI/GU: Bowel sounds present. Soft. Non tender.  PEG tube. MSK/EXT:  Moves extremities. No apparent deformity.  Trace edema. SKIN: no apparent skin lesion or wound NEURO: Awake, alert and oriented fairly.  No apparent focal neuro deficit. PSYCH: Calm.  Flat affect.  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   Aspiration pneumonia 02/16/2020-respiratory failure improving. -CXR on 4/14 with RML, RLL and Left mid lung opacities concerning for multifocal pneumonia -Continue CTX and azithromycin 4/12>> -Aspiration precautions.  Acute on chronic hypoxic respiratory failure-likely due to aspiration pneumonia and fluid overload.  Down to 3 L by [place]. -Self decannulated on 3/19 -Manage pneumonia as above -Wean oxygen as able  MRSA/Pseudomonas aeruginosa pneumonia-previously completed antibiotic course for this.  DM-2 with hyperglycemia:  A1c 5.7%. Recent Labs    02/20/20 0405 02/20/20 0845 02/20/20 1215  GLUCAP 194* 214* 176*  -Continue current regimen  Essential hypertension: Normotensive -Continue PRN metoprolol  Anemia of chronic kidney disease -ESA and IV iron per nephrology  Hypokalemia: Resolved. -Continue monitoring  Hyponatremia: Relatively  stable.  Anxiety/depression/agitation -Continue Paxil, BuSpar, trazodone -Frequent orientation and delirium precautions -As needed Haldol and scheduled Seroquel.   GERD/constipation -Continue PPI and bowel regimen  Mild thrombocytopenia: Stable. -Continue monitoring  Generalized weakness/debility -PT/OT-not able to participate in therapy.  Requiring mechanical lift to chair.  Chronic pain: -Scheduled Tylenol 1000 mg 3 times daily -Continue as needed fentanyl  Goals of care/Ethical issues-complex medical issue with guarded prognosis.  Daughter wishes her to be full code with full scope of care including dialysis.  Patient refused HD previously but deemed to lack capacity by psych.  Has been refusing to commit to dialysis on recliner chair. -Palliative care following -Ethics involved previously.  Nutritional support/dysphagia -Appreciate input by SLP and dietitian -Continue TF and dysphagia 2 diet Nutrition Problem: Inadequate oral intake Etiology: inability to eat  Signs/Symptoms: NPO status  Interventions: Tube feeding   Pressure Injury 02/16/20 Coccyx Stage 1 -  Intact skin with non-blanchable redness of a localized area usually over a bony prominence. (Active)  02/16/20 1700  Location: Coccyx  Location Orientation:   Staging: Stage 1 -  Intact skin with non-blanchable redness of a localized area usually over a bony prominence.  Wound Description (Comments):   Present on Admission:      DVT prophylaxis: Start subcu heparin Code Status: Full code Family Communication: None at bedside.  Updated patient's daughter at bedside on 4/14.  Discharge barrier: Inability/refusal to dialyze on recliner chair and outpatient dialysis spot Patient is from: [facility] Final disposition: Likely SNF when she tolerates HD in recliner chair for outpatient HD.  Consultants:  PCCM (off) Nephrology PMT   Sch Meds:  Scheduled Meds: . acetaminophen (TYLENOL) oral liquid  160 mg/5 mL  1,000 mg Per Tube Q8H  . bisacodyl  10 mg Rectal Daily  . busPIRone  5 mg Per Tube BID  . calcium acetate  667 mg Oral TID WC  . camphor-menthol   Topical QID  . chlorhexidine gluconate (MEDLINE KIT)  15 mL Mouth Rinse BID  . Chlorhexidine Gluconate Cloth  6 each Topical Q0600  . [START ON 02/26/2020] darbepoetin (ARANESP) injection - DIALYSIS  200 mcg Subcutaneous Q Thu-HD  . feeding supplement (NEPRO CARB STEADY)  1,000 mL Per Tube Q24H  . feeding supplement (PRO-STAT SUGAR FREE 64)  30 mL Per Tube TID  . insulin aspart  0-15 Units Subcutaneous Q4H  . pantoprazole sodium  40 mg Per Tube BID  . PARoxetine  40 mg Per Tube Daily  . QUEtiapine  25 mg Per Tube BID  . sodium chloride flush  10-40 mL Intracatheter Q12H  . traZODone  25 mg Per Tube QHS   Continuous Infusions:  PRN Meds:.feeding supplement (NEPRO CARB STEADY), fentaNYL (SUBLIMAZE) injection, guaiFENesin-codeine, haloperidol lactate, HYDROcodone-acetaminophen, metoprolol tartrate, ondansetron (ZOFRAN) IV, phenol, promethazine, sodium chloride flush  Antimicrobials: Anti-infectives (From admission, onward)   Start     Dose/Rate Route Frequency Ordered Stop   02/16/20 1230  cefTRIAXone (ROCEPHIN) 2 g in sodium chloride 0.9 % 100 mL IVPB     2 g 200 mL/hr over 30 Minutes Intravenous Every 24 hours 02/16/20 1127 02/20/20 0941   02/16/20 1230  azithromycin (ZITHROMAX) 500 mg in sodium chloride 0.9 %  250 mL IVPB     500 mg 250 mL/hr over 60 Minutes Intravenous Every 24 hours 02/16/20 1127 02/20/20 1140   01/10/20 0930  vancomycin (VANCOCIN) IVPB 1000 mg/200 mL premix  Status:  Discontinued     1,000 mg 200 mL/hr over 60 Minutes Intravenous  Once 01/10/20 0915 01/13/20 0623   01/06/20 2200  ceFEPIme (MAXIPIME) 2 g in sodium chloride 0.9 % 100 mL IVPB     2 g 200 mL/hr over 30 Minutes Intravenous Every 24 hours 01/05/20 2251 01/11/20 2234   01/05/20 2200  ceFEPIme (MAXIPIME) 2 g in sodium chloride 0.9 % 100 mL IVPB      2 g 200 mL/hr over 30 Minutes Intravenous  Once 01/05/20 2148 01/06/20 0015   01/05/20 2200  metroNIDAZOLE (FLAGYL) IVPB 500 mg     500 mg 100 mL/hr over 60 Minutes Intravenous  Once 01/05/20 2148 01/06/20 0015   01/05/20 2200  vancomycin (VANCOCIN) IVPB 1000 mg/200 mL premix  Status:  Discontinued     1,000 mg 200 mL/hr over 60 Minutes Intravenous  Once 01/05/20 2148 01/05/20 2151   01/05/20 2200  vancomycin (VANCOREADY) IVPB 2000 mg/400 mL  Status:  Discontinued     2,000 mg 200 mL/hr over 120 Minutes Intravenous  Once 01/05/20 2151 01/05/20 2223       I have personally reviewed the following labs and images: CBC: Recent Labs  Lab 02/16/20 1216 02/17/20 0316 02/18/20 0227 02/19/20 0413 02/20/20 0318  WBC 12.9* 11.1* 17.2* 13.8* 8.6  HGB 9.8* 8.3* 8.3* 7.8* 7.4*  HCT 34.1* 28.5* 29.0* 26.9* 25.0*  MCV 95.3 95.3 96.3 95.1 94.7  PLT 161 140* 141* 130* PLATELET CLUMPS NOTED ON SMEAR, UNABLE TO ESTIMATE   BMP &GFR Recent Labs  Lab 02/14/20 0852 02/14/20 0852 02/16/20 1216 02/17/20 0316 02/18/20 0227 02/19/20 0413 02/20/20 0318  NA 134*   < > 135 136 136 133* 135  K 4.2   < > 4.2 3.6 3.0* 4.0 3.6  CL 94*   < > 95* 97* 95* 95* 95*  CO2 29   < > '26 28 30 27 24  '$ GLUCOSE 158*   < > 188* 120* 126* 125* 214*  BUN 107*   < > 85* 105* 41* 58* 36*  CREATININE 1.90*   < > 1.87* 2.31* 1.58* 2.06* 1.48*  CALCIUM 8.9   < > 9.3 8.7* 8.3* 8.6* 8.7*  MG 2.6*  --   --   --   --  2.2 1.9  PHOS 4.6  --   --   --   --  2.3* 2.1*   < > = values in this interval not displayed.   Estimated Creatinine Clearance: 36.6 mL/min (A) (by C-G formula based on SCr of 1.48 mg/dL (H)). Liver & Pancreas: Recent Labs  Lab 02/14/20 0852 02/19/20 0413 02/20/20 0318  AST  --  20  --   ALT  --  17  --   ALKPHOS  --  112  --   BILITOT  --  0.5  --   PROT  --  6.1*  --   ALBUMIN 2.7* 2.4*  2.5* 2.4*   No results for input(s): LIPASE, AMYLASE in the last 168 hours. No results for input(s):  AMMONIA in the last 168 hours. Diabetic: No results for input(s): HGBA1C in the last 72 hours. Recent Labs  Lab 02/19/20 2019 02/20/20 0017 02/20/20 0405 02/20/20 0845 02/20/20 1215  GLUCAP 176* 194* 194* 214* 176*   Cardiac  Enzymes: No results for input(s): CKTOTAL, CKMB, CKMBINDEX, TROPONINI in the last 168 hours. No results for input(s): PROBNP in the last 8760 hours. Coagulation Profile: No results for input(s): INR, PROTIME in the last 168 hours. Thyroid Function Tests: No results for input(s): TSH, T4TOTAL, FREET4, T3FREE, THYROIDAB in the last 72 hours. Lipid Profile: No results for input(s): CHOL, HDL, LDLCALC, TRIG, CHOLHDL, LDLDIRECT in the last 72 hours. Anemia Panel: No results for input(s): VITAMINB12, FOLATE, FERRITIN, TIBC, IRON, RETICCTPCT in the last 72 hours. Urine analysis:    Component Value Date/Time   COLORURINE AMBER (A) 01/13/2020 1302   APPEARANCEUR TURBID (A) 01/13/2020 1302   LABSPEC 1.018 01/13/2020 1302   PHURINE 5.0 01/13/2020 1302   GLUCOSEU NEGATIVE 01/13/2020 1302   HGBUR SMALL (A) 01/13/2020 1302   BILIRUBINUR NEGATIVE 01/13/2020 1302   KETONESUR NEGATIVE 01/13/2020 1302   PROTEINUR >=300 (A) 01/13/2020 1302   NITRITE NEGATIVE 01/13/2020 1302   LEUKOCYTESUR LARGE (A) 01/13/2020 1302   Sepsis Labs: Invalid input(s): PROCALCITONIN, [place]  Microbiology: Recent Results (from the past 240 hour(s))  Culture, blood (routine x 2) Call MD if unable to obtain prior to antibiotics being given     Status: None (Preliminary result)   Collection Time: 02/16/20 12:07 PM   Specimen: BLOOD LEFT HAND  Result Value Ref Range Status   Specimen Description BLOOD LEFT HAND  Final   Special Requests   Final    BOTTLES DRAWN AEROBIC AND ANAEROBIC Blood Culture results may not be optimal due to an inadequate volume of blood received in culture bottles   Culture   Final    NO GROWTH 3 DAYS Performed at [facility], [place] [address]  [place], [place] [address]    Report Status PENDING  Incomplete  Culture, blood (routine x 2) Call MD if unable to obtain prior to antibiotics being given     Status: None (Preliminary result)   Collection Time: 02/16/20 12:16 PM   Specimen: BLOOD LEFT HAND  Result Value Ref Range Status   Specimen Description BLOOD LEFT HAND  Final   Special Requests   Final    BOTTLES DRAWN AEROBIC AND ANAEROBIC Blood Culture adequate volume   Culture   Final    NO GROWTH 3 DAYS Performed at [facility], [place] [address] [place], [place] [address]    Report Status PENDING  Incomplete    Radiology Studies: No results found.    Bailey Cox T. Benjamin  If 7PM-7AM, please contact night-coverage [email or web address] Password [email or web address] 02/20/2020, 1:32 PM

## 2020-02-20 NOTE — Progress Notes (Signed)
Admit: 01/05/2020 LOS: 26  71F prolonged AKI now ESRD  Subjective:  . HD yesterday, not in recliner . TRH and Palliative notes reviewed .  unwilling to commit to HD in recliner tomorrow  04/15 0701 - 04/16 0700 In: 1217 [P.O.:737; NG/GT:480] Out: 1000   Filed Weights   02/19/20 0444 02/19/20 1415 02/19/20 1750  Weight: 84.7 kg 85.1 kg 84.2 kg    Scheduled Meds: . bisacodyl  10 mg Rectal Daily  . busPIRone  5 mg Per Tube BID  . calcium acetate  667 mg Oral TID WC  . camphor-menthol   Topical QID  . chlorhexidine gluconate (MEDLINE KIT)  15 mL Mouth Rinse BID  . Chlorhexidine Gluconate Cloth  6 each Topical Q0600  . darbepoetin (ARANESP) injection - DIALYSIS  100 mcg Subcutaneous Q Thu-HD  . feeding supplement (NEPRO CARB STEADY)  1,000 mL Oral Q24H  . feeding supplement (PRO-STAT SUGAR FREE 64)  30 mL Per Tube TID  . insulin aspart  0-15 Units Subcutaneous Q4H  . pantoprazole sodium  40 mg Per Tube BID  . PARoxetine  40 mg Per Tube Daily  . QUEtiapine  25 mg Per Tube BID  . sodium chloride flush  10-40 mL Intracatheter Q12H  . traZODone  25 mg Per Tube QHS   Continuous Infusions: . azithromycin 500 mg (02/20/20 1040)   PRN Meds:.acetaminophen (TYLENOL) oral liquid 160 mg/5 mL, feeding supplement (NEPRO CARB STEADY), fentaNYL (SUBLIMAZE) injection, guaiFENesin-codeine, haloperidol lactate, HYDROcodone-acetaminophen, metoprolol tartrate, ondansetron (ZOFRAN) IV, phenol, promethazine, sodium chloride flush  Current Labs: reviewed    Physical Exam:  Blood pressure (!) 132/55, pulse 96, temperature 98.9 F (37.2 C), temperature source Oral, resp. rate 19, height '5\' 4"'$  (1.626 m), weight 84.2 kg, SpO2 92 %. NAD Regular, nl s1s2 CTAB No sig LEE Trach site bandaged R IJ TDC  A 1. Dialysis dep't AKI now ESRD on THS schedule  2. Debility; full scope of care currenty 3. Difficulting with tolerating HD 4. Anemia of CKD; ESA qThur 5. Hx/o PEA arrest 6. Hx/o trach,  decannulated  P . Cont HD on  THS schedule:  3.5h/ 400/800, 2K 1-2L UF, TDC, no heparin . Hb downtrendig, inc ESA dosing; rpt Fe levels . Daily weights, Daily Renal Panel, Strict I/Os, Avoid nephrotoxins (NSAIDs, judicious IV Contrast)  . Medication Issues; o Preferred narcotic agents for pain control are hydromorphone, fentanyl, and methadone. Morphine should not be used.  o Baclofen should be avoided o Avoid oral sodium phosphate and magnesium citrate based laxatives / bowel preps    Pearson Grippe MD 02/20/2020, 11:06 AM  Recent Labs  Lab 02/14/20 2119 02/16/20 1216 02/18/20 0227 02/19/20 0413 02/20/20 0318  NA 134*   < > 136 133* 135  K 4.2   < > 3.0* 4.0 3.6  CL 94*   < > 95* 95* 95*  CO2 29   < > '30 27 24  '$ GLUCOSE 158*   < > 126* 125* 214*  BUN 107*   < > 41* 58* 36*  CREATININE 1.90*   < > 1.58* 2.06* 1.48*  CALCIUM 8.9   < > 8.3* 8.6* 8.7*  PHOS 4.6  --   --  2.3* 2.1*   < > = values in this interval not displayed.   Recent Labs  Lab 02/18/20 0227 02/19/20 0413 02/20/20 0318  WBC 17.2* 13.8* 8.6  HGB 8.3* 7.8* 7.4*  HCT 29.0* 26.9* 25.0*  MCV 96.3 95.1 94.7  PLT 141* 130* PLATELET CLUMPS  NOTED ON SMEAR, UNABLE TO ESTIMATE

## 2020-02-20 NOTE — Progress Notes (Signed)
Encouraged patient to try Chest vest but unwilling to do so, she attempted flutter but was unable to do as well. BBS heard throughout, Clear with good Saturations and Heart Rate.

## 2020-02-20 NOTE — Progress Notes (Signed)
  Speech Language Pathology Treatment: Dysphagia  Patient Details Name: Bailey Cox MRN: [id number] DOB: 27-Jun-1946 Today's Date: 02/20/2020 Time: 1583-0940 SLP Time Calculation (min) (ACUTE ONLY): 16 min  Assessment / Plan / Recommendation Clinical Impression  Pt has been taking in minimal POs per chart review and D/W RN; however, she is not demonstrating s/s of aspiration.  Today, she consumed peaches and water.  Declined further intake after four bites.  There was prolonged oral preparation of solids due to absence of teeth, but pt was able to masticate soft solids functionally despite extra time needed.  Large, successive boluses of thin liquids do not elicit coughing nor other s/s of aspiration.  Primary obstacle to PO intake is motivation/appetite.  There are no further acute care SLP needs.  Recommend advancing diet at least to dysphagia 2 (chopped) per Bailey Cox's preference; continue thin liquids; meds can be crushed and given in puree.  Diet can be advanced according to patient's wishes - she will not need another SLP consult to progress diet.   Our service will sign off.    HPI HPI: 74 y.o. female who had a prolonged 4 day hospitalization at [facility] with a history of HTN COPD DM hypothyroidism Ogilvie's syndrome. PEA arrest during EGD in early December with duodenal ulcer noted on EGD. Intermittent hyperkalemia treatet medically w/ Kayexalate and now recently w/ worsening renal function over the past few days prior to admission to [facility].  during admission at [facility] pt weaned off the vent and eventually decannulated. According to her daughter, she has not had anything to eat or drink since November due to primary problem Olglivie's Syndrome, but had no difficulty with swallowing prior.      SLP Plan  All goals met       Recommendations  Diet recommendations: Dysphagia 2 (fine chop);Thin liquid Liquids provided via: Cup;Straw Medication Administration: Crushed with  puree Supervision: Staff to assist with self feeding Postural Changes and/or Swallow Maneuvers: Seated upright 90 degrees                Oral Care Recommendations: Oral care BID SLP Visit Diagnosis: Dysphagia, unspecified (R13.10) Plan: All goals met      GO                Bailey Cox Bailey Cox 02/20/2020, 1:04 PM  Bailey Cox Bailey Cox, [place] Office number [phone] Pager [phone]

## 2020-02-21 DIAGNOSIS — J9601 Acute respiratory failure with hypoxia: Secondary | ICD-10-CM | POA: Diagnosis not present

## 2020-02-21 DIAGNOSIS — N184 Chronic kidney disease, stage 4 (severe): Secondary | ICD-10-CM | POA: Diagnosis not present

## 2020-02-21 DIAGNOSIS — N179 Acute kidney failure, unspecified: Secondary | ICD-10-CM | POA: Diagnosis not present

## 2020-02-21 DIAGNOSIS — J9611 Chronic respiratory failure with hypoxia: Secondary | ICD-10-CM | POA: Diagnosis not present

## 2020-02-21 LAB — CBC
HCT: 26.4 % — ABNORMAL LOW (ref 36.0–46.0)
Hemoglobin: 7.4 g/dL — ABNORMAL LOW (ref 12.0–15.0)
MCH: 27.1 pg (ref 26.0–34.0)
MCHC: 28 g/dL — ABNORMAL LOW (ref 30.0–36.0)
MCV: 96.7 fL (ref 80.0–100.0)
Platelets: 105 10*3/uL — ABNORMAL LOW (ref 150–400)
RBC: 2.73 MIL/uL — ABNORMAL LOW (ref 3.87–5.11)
RDW: 17.3 % — ABNORMAL HIGH (ref 11.5–15.5)
WBC: 7.4 10*3/uL (ref 4.0–10.5)
nRBC: 0.5 % — ABNORMAL HIGH (ref 0.0–0.2)

## 2020-02-21 LAB — CULTURE, BLOOD (ROUTINE X 2)
Culture: NO GROWTH
Culture: NO GROWTH
Special Requests: ADEQUATE

## 2020-02-21 LAB — MAGNESIUM: Magnesium: 2.2 mg/dL (ref 1.7–2.4)

## 2020-02-21 LAB — GLUCOSE, CAPILLARY
Glucose-Capillary: 139 mg/dL — ABNORMAL HIGH (ref 70–99)
Glucose-Capillary: 153 mg/dL — ABNORMAL HIGH (ref 70–99)
Glucose-Capillary: 163 mg/dL — ABNORMAL HIGH (ref 70–99)
Glucose-Capillary: 176 mg/dL — ABNORMAL HIGH (ref 70–99)
Glucose-Capillary: 202 mg/dL — ABNORMAL HIGH (ref 70–99)
Glucose-Capillary: 203 mg/dL — ABNORMAL HIGH (ref 70–99)
Glucose-Capillary: 221 mg/dL — ABNORMAL HIGH (ref 70–99)
Glucose-Capillary: 226 mg/dL — ABNORMAL HIGH (ref 70–99)

## 2020-02-21 LAB — RENAL FUNCTION PANEL
Albumin: 2.1 g/dL — ABNORMAL LOW (ref 3.5–5.0)
Anion gap: 12 (ref 5–15)
BUN: 71 mg/dL — ABNORMAL HIGH (ref 8–23)
CO2: 28 mmol/L (ref 22–32)
Calcium: 9 mg/dL (ref 8.9–10.3)
Chloride: 93 mmol/L — ABNORMAL LOW (ref 98–111)
Creatinine, Ser: 2.19 mg/dL — ABNORMAL HIGH (ref 0.44–1.00)
GFR calc Af Amer: 25 mL/min — ABNORMAL LOW (ref >60)
GFR calc non Af Amer: 22 mL/min — ABNORMAL LOW (ref >60)
Glucose, Bld: 228 mg/dL — ABNORMAL HIGH (ref 70–99)
Phosphorus: 4.1 mg/dL (ref 2.5–4.6)
Potassium: 4.3 mmol/L (ref 3.5–5.1)
Sodium: 133 mmol/L — ABNORMAL LOW (ref 135–145)

## 2020-02-21 MED ORDER — INSULIN GLARGINE 100 UNIT/ML ~~LOC~~ SOLN
5.0000 [IU] | Freq: Two times a day (BID) | SUBCUTANEOUS | Status: DC
  Administered 2020-02-21 (×2): 5 [IU] via SUBCUTANEOUS
  Filled 2020-02-21 (×4): qty 0.05

## 2020-02-21 MED ORDER — GUAIFENESIN 100 MG/5ML PO SOLN
10.0000 mL | ORAL | Status: DC | PRN
  Administered 2020-02-21: 200 mg via ORAL
  Filled 2020-02-21 (×2): qty 5

## 2020-02-21 NOTE — Progress Notes (Signed)
Dialysis RN called and get report on PT. Pt refuses to go to dialysis today, encouraged patient to comply and go for the treatment but she refuses.  Dialysis nurse aware of patient's refusal.

## 2020-02-21 NOTE — Progress Notes (Signed)
[place] Informed Primary RN, Shanon Brow that HD will be 02/22/2020 in the morning, per Dr Joelyn Oms

## 2020-02-21 NOTE — Progress Notes (Addendum)
Daily Progress Note   Patient Name: Bailey Cox       Date: 02/21/2020 DOB: 09-30-1946  Age: 74 y.o. MRN#: [id number] Attending Physician: Mercy Riding, MD Primary Care Physician: Patient, No Pcp Per Admit Date: 01/05/2020  Reason for Consultation/Follow-up: To discuss complex medical decision making related to patient's goals of care  Subjective:  Met patient and patient's daughter at bedside.  Patient requests cough medicine as she has an irritating non-productive cough.  I placed an order for guaifenesin.  [name] is at bedside and asks when the patient is scheduled for HD.    I talked with [name] about the patient's need to sit in a chair for HD in order to leave the hospital to be closer to home.   [name] understands.  [place] on the phone.   [name] anticipates the family will continue to send 1 member to every dialysis session with [name].   They very much want her out of the hospital and to be closer to home [facility]).  Goals set on 4/3 with MOST form - full code, full scope.  Assessment: Concerned that the patient's children are trying to live for her and carry her thru - which may not be a good long term plan for survival.   Patient Profile/HPI:  74 y.o. female  with past medical history of cholecystectomy with CBD stenting in September followed by PEA arrest during endoscopy that lead to hospitalization from December 2020 until January 05, 2020 at [facility] 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 She was discharged to [facility] on 3/1 who immediately sent her to [facility].  On admission 01/05/2020 she was found to have acidosis with acute on chronic kidney failure.  Unfortunately  she has not had renal recovery.  Length of Stay: 47   Vital Signs: BP (!) 117/55 (BP Location: Left Arm)   Pulse 92   Temp 98 F (36.7 C) (Oral)   Resp (!) 23   Ht '5\' 4"'$  (1.626 m)   Wt 84.2 kg   LMP  (LMP Unknown)   SpO2 (!) 87%   BMI 31.86 kg/m  SpO2: SpO2: (!) 87 % O2 Device: O2 Device: High Flow Nasal Cannula O2 Flow Rate: O2 Flow Rate (L/min): 3 L/min  Palliative Assessment/Data: 30%     Palliative Care Plan    Recommendations/Plan:  Full scope treatment.  I do not believe the family is going to change their goals of care.  Family is making an effort to stay overnight with patient and attend every dialysis session.  PMT will follow at a distance.  Please call us if patient declines or there is a significant development where we can be of assistance.  Code Status:  Full code  Prognosis:   Unable to determine.  I'm concerned that the patient is fragile and at high risk for acute decline.   Discharge Planning:  [place] for rehab with Palliative care service follow-up  Care plan was discussed with daughters [name] and [place] as well as bedside RN Shanon Brow.  Thank you for allowing the Palliative Medicine Team to assist in the care of this patient.  Total time spent:  35 min.     Greater than 50%  of this time was spent counseling and coordinating care related to the above assessment and plan.  Florentina Jenny, PA-C Palliative Medicine  Please contact Palliative MedicineTeam phone at [phone] for questions and concerns between 7 am - 7 pm.   Please see AMION for individual provider pager numbers.

## 2020-02-21 NOTE — Progress Notes (Signed)
PROGRESS NOTE  Bailey Cox YME:[id number] DOB: 19-Apr-1946   PCP: Patient, No Pcp Per  Patient is from: Home  DOA: 01/05/2020 LOS: 45  Brief Narrative / Interim history: 74 year old female with history of cholecystitis status post cholecystectomy in 9/20 at [facility].  She was discharged to SNF but rehospitalized to [facility] and had bile duct stent.  Discharged back to SNF but readmitted with Ogilive syndrome.  Hospital course complicated by fluid overload and transferred to [facility] for dialysis.  At [facility], developed respiratory failure requiring vent support.  She underwent trach.she was eventually discharged to [facility] on 3/1 after prolonged hospitalization at [facility] but deemed too sick, and sent to [facility] due to fluid overload and acidosis.  Patient was initially admitted to ICU.  Required CRRT 3/9-3/12.  Transfer to floor on 3/14.  She pulled her trach.  Now on oxygen by nasal cannula.  She is on IHD via [place].  Has refused dialysis at times but deemed to lack capacity.  Family wants full scope of care including full code and dialysis.  Ethics committee meeting held on 4/6.  Remains inpatient until she is able to tolerate HD on recliner chair and has outpatient HD spot.  She has been noncompliant with care and hemodialysis.  Subjective: Seen and examined earlier this morning.  Refused hemodialysis and CPT this morning.  Complaining of whole body pain.  Objective: Vitals:   02/21/20 0811 02/21/20 0900 02/21/20 1100 02/21/20 1117  BP: 122/63   (!) 133/54  Pulse: 88  96 97  Resp: (!) 21 (!) 24 (!) 25 (!) 25  Temp: 98.2 F (36.8 C)   98 F (36.7 C)  TempSrc: Axillary   Oral  SpO2: 100%  92% 93%  Weight:      Height:       No intake or output data in the 24 hours ending 02/21/20 1344 Filed Weights   02/19/20 0444 02/19/20 1415 02/19/20 1750  Weight: 84.7 kg 85.1 kg 84.2 kg    Examination:  GENERAL: Chronically ill-appearing.  No apparent distress. HEENT: MMM.   Vision and hearing grossly intact.  NECK: Tracheostomy. RESP: 93% on 3 L by [place].  No IWOB but apparent upper airway secretion CVS:  RRR. Heart sounds normal.  ABD/GI/GU: Bowel sounds present. Soft.  Mild tenderness.  PEG tube. MSK/EXT: No apparent deformity.  Trace edema. SKIN: no apparent skin lesion or wound NEURO: Sleepy but wakes to voice easily.  No apparent focal neuro deficit other than generalized weakness. PSYCH: Calm.  Flat affect.  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  She also needs to tolerate HD in recliner chair for outpatient HD. -Nephrology managing.  Aspiration pneumonia 02/16/2020-respiratory failure improving. -CXR on 4/14 with RML, RLL and Left mid lung opacities concerning for multifocal pneumonia -Continue CTX and azithromycin 4/12>> -Aspiration precautions.  Acute on chronic hypoxic respiratory failure-likely due to aspiration pneumonia and fluid overload.  93% on 3 L by [place].  Appears to have some upper airway secretion but refusing CPT. -Self decannulated on 3/19 -Manage pneumonia as above -Wean oxygen as able -Continue encouraging CPT.  MRSA/Pseudomonas aeruginosa pneumonia-previously completed antibiotic course for this.  DM-2 with hyperglycemia: A1c 5.7%. Recent Labs    02/21/20 1047  02/21/20 1115 02/21/20 1317  GLUCAP 203* 221* 202*  -Continue SSI-moderate. -Add Lantus 5 units twice daily.  Essential hypertension: Normotensive -Continue PRN metoprolol  Anemia of chronic kidney disease: Hgb 9.4 (admit)>>> 7.4> 7.4 -ESA and IV iron per nephrology  Hypokalemia: Resolved.  -Continue monitoring  Hyponatremia: Relatively stable.  Anxiety/depression/agitation -Continue Paxil, BuSpar, trazodone -Frequent orientation and delirium precautions -As needed Haldol and scheduled Seroquel.   GERD/constipation -Continue PPI and bowel regimen  Mild thrombocytopenia: Stable. -Continue monitoring  Generalized weakness/debility -PT/OT-not able to participate in therapy.  Requiring mechanical lift to chair.  Chronic pain: -Scheduled Tylenol 1000 mg 3 times daily -Continue as needed fentanyl  Goals of care/Ethical issues-complex medical issue with guarded prognosis.  Daughter wishes her to be full code with full scope of care including dialysis.  Patient refused HD previously but deemed to lack capacity by psych.  Has been refusing to commit to dialysis on recliner chair. -Palliative care following -Ethics involved previously.  Nutritional support/dysphagia -Appreciate input by SLP and dietitian -Continue TF and dysphagia 2 diet Nutrition Problem: Inadequate oral intake Etiology: inability to eat  Signs/Symptoms: NPO status  Interventions: Tube feeding   Pressure Injury 02/16/20 Coccyx Stage 1 -  Intact skin with non-blanchable redness of a localized area usually over a bony prominence. (Active)  02/16/20 1700  Location: Coccyx  Location Orientation:   Staging: Stage 1 -  Intact skin with non-blanchable redness of a localized area usually over a bony prominence.  Wound Description (Comments):   Present on Admission:      DVT prophylaxis: Start subcu heparin Code Status: Full code Family Communication: None at bedside.    Discharge barrier: Inability/refusal to dialyze on recliner chair and outpatient dialysis spot Patient is from: [facility] Final disposition: Likely SNF when she tolerates HD in recliner chair for outpatient HD.  Consultants:  PCCM (off) Nephrology PMT   Sch Meds:  Scheduled Meds: . acetaminophen (TYLENOL) oral  liquid 160 mg/5 mL  1,000 mg Per Tube Q8H  . bisacodyl  10 mg Rectal Daily  . busPIRone  5 mg Per Tube BID  . calcium acetate  667 mg Oral TID WC  . camphor-menthol   Topical QID  . chlorhexidine gluconate (MEDLINE KIT)  15 mL Mouth Rinse BID  . Chlorhexidine Gluconate Cloth  6 each Topical Q0600  . [START ON 02/26/2020] darbepoetin (ARANESP) injection - DIALYSIS  200 mcg Subcutaneous Q Thu-HD  . feeding supplement (NEPRO CARB STEADY)  1,000 mL Per Tube Q24H  . feeding supplement (PRO-STAT SUGAR FREE 64)  30 mL Per Tube TID  . insulin aspart  0-15 Units Subcutaneous Q4H  . insulin glargine  5 Units Subcutaneous BID  . pantoprazole sodium  40 mg Per Tube BID  . PARoxetine  40 mg Per Tube Daily  . QUEtiapine  25 mg Per Tube BID  . sodium chloride flush  10-40 mL Intracatheter Q12H  . traZODone  25 mg Per Tube QHS   Continuous Infusions:  PRN Meds:.feeding supplement (NEPRO CARB STEADY), fentaNYL (SUBLIMAZE) injection, guaiFENesin-codeine, haloperidol lactate, HYDROcodone-acetaminophen, metoprolol tartrate, ondansetron (ZOFRAN) IV, phenol, promethazine, sodium chloride flush  Antimicrobials: Anti-infectives (From admission, onward)   Start     Dose/Rate Route Frequency Ordered Stop   02/16/20 1230  cefTRIAXone (ROCEPHIN) 2 g in sodium chloride 0.9 % 100 mL IVPB     2 g 200 mL/hr over 30 Minutes Intravenous Every 24 hours 02/16/20 1127 02/20/20 0941   02/16/20 1230  azithromycin (ZITHROMAX) 500 mg in  sodium chloride 0.9 % 250 mL IVPB     500 mg 250 mL/hr over 60 Minutes Intravenous Every 24 hours 02/16/20 1127 02/20/20 1140   01/10/20 0930  vancomycin (VANCOCIN) IVPB 1000 mg/200 mL premix  Status:  Discontinued     1,000 mg 200 mL/hr over 60 Minutes Intravenous  Once 01/10/20 0915 01/13/20 0623   01/06/20 2200  ceFEPIme (MAXIPIME) 2 g in sodium chloride 0.9 % 100 mL IVPB     2 g 200 mL/hr over 30 Minutes Intravenous Every 24 hours 01/05/20 2251 01/11/20 2234   01/05/20 2200  ceFEPIme  (MAXIPIME) 2 g in sodium chloride 0.9 % 100 mL IVPB     2 g 200 mL/hr over 30 Minutes Intravenous  Once 01/05/20 2148 01/06/20 0015   01/05/20 2200  metroNIDAZOLE (FLAGYL) IVPB 500 mg     500 mg 100 mL/hr over 60 Minutes Intravenous  Once 01/05/20 2148 01/06/20 0015   01/05/20 2200  vancomycin (VANCOCIN) IVPB 1000 mg/200 mL premix  Status:  Discontinued     1,000 mg 200 mL/hr over 60 Minutes Intravenous  Once 01/05/20 2148 01/05/20 2151   01/05/20 2200  vancomycin (VANCOREADY) IVPB 2000 mg/400 mL  Status:  Discontinued     2,000 mg 200 mL/hr over 120 Minutes Intravenous  Once 01/05/20 2151 01/05/20 2223       I have personally reviewed the following labs and images: CBC: Recent Labs  Lab 02/17/20 0316 02/18/20 0227 02/19/20 0413 02/20/20 0318 02/21/20 1152  WBC 11.1* 17.2* 13.8* 8.6 7.4  HGB 8.3* 8.3* 7.8* 7.4* 7.4*  HCT 28.5* 29.0* 26.9* 25.0* 26.4*  MCV 95.3 96.3 95.1 94.7 96.7  PLT 140* 141* 130* PLATELET CLUMPS NOTED ON SMEAR, UNABLE TO ESTIMATE PENDING   BMP &GFR Recent Labs  Lab 02/17/20 0316 02/18/20 0227 02/19/20 0413 02/20/20 0318 02/21/20 1152  NA 136 136 133* 135 133*  K 3.6 3.0* 4.0 3.6 4.3  CL 97* 95* 95* 95* 93*  CO2 '28 30 27 24 28  '$ GLUCOSE 120* 126* 125* 214* 228*  BUN 105* 41* 58* 36* 71*  CREATININE 2.31* 1.58* 2.06* 1.48* 2.19*  CALCIUM 8.7* 8.3* 8.6* 8.7* 9.0  MG  --   --  2.2 1.9 2.2  PHOS  --   --  2.3* 2.1* 4.1   Estimated Creatinine Clearance: 24.7 mL/min (A) (by C-G formula based on SCr of 2.19 mg/dL (H)). Liver & Pancreas: Recent Labs  Lab 02/19/20 0413 02/20/20 0318 02/21/20 1152  AST 20  --   --   ALT 17  --   --   ALKPHOS 112  --   --   BILITOT 0.5  --   --   PROT 6.1*  --   --   ALBUMIN 2.4*  2.5* 2.4* 2.1*   No results for input(s): LIPASE, AMYLASE in the last 168 hours. No results for input(s): AMMONIA in the last 168 hours. Diabetic: No results for input(s): HGBA1C in the last 72 hours. Recent Labs  Lab 02/21/20  0417 02/21/20 0815 02/21/20 1047 02/21/20 1115 02/21/20 1317  GLUCAP 153* 163* 203* 221* 202*   Cardiac Enzymes: No results for input(s): CKTOTAL, CKMB, CKMBINDEX, TROPONINI in the last 168 hours. No results for input(s): PROBNP in the last 8760 hours. Coagulation Profile: No results for input(s): INR, PROTIME in the last 168 hours. Thyroid Function Tests: No results for input(s): TSH, T4TOTAL, FREET4, T3FREE, THYROIDAB in the last 72 hours. Lipid Profile: No results for input(s): CHOL, HDL, LDLCALC,  TRIG, CHOLHDL, LDLDIRECT in the last 72 hours. Anemia Panel: No results for input(s): VITAMINB12, FOLATE, FERRITIN, TIBC, IRON, RETICCTPCT in the last 72 hours. Urine analysis:    Component Value Date/Time   COLORURINE AMBER (A) 01/13/2020 1302   APPEARANCEUR TURBID (A) 01/13/2020 1302   LABSPEC 1.018 01/13/2020 1302   PHURINE 5.0 01/13/2020 1302   GLUCOSEU NEGATIVE 01/13/2020 1302   HGBUR SMALL (A) 01/13/2020 1302   BILIRUBINUR NEGATIVE 01/13/2020 1302   KETONESUR NEGATIVE 01/13/2020 1302   PROTEINUR >=300 (A) 01/13/2020 1302   NITRITE NEGATIVE 01/13/2020 1302   LEUKOCYTESUR LARGE (A) 01/13/2020 1302   Sepsis Labs: Invalid input(s): PROCALCITONIN, [place]  Microbiology: Recent Results (from the past 240 hour(s))  Culture, blood (routine x 2) Call MD if unable to obtain prior to antibiotics being given     Status: None   Collection Time: 02/16/20 12:07 PM   Specimen: BLOOD LEFT HAND  Result Value Ref Range Status   Specimen Description BLOOD LEFT HAND  Final   Special Requests   Final    BOTTLES DRAWN AEROBIC AND ANAEROBIC Blood Culture results may not be optimal due to an inadequate volume of blood received in culture bottles   Culture   Final    NO GROWTH 5 DAYS Performed at [facility], [place] [address] [place], [place] [address]    Report Status 02/21/2020 FINAL  Final  Culture, blood (routine x 2) Call MD if unable to obtain prior to antibiotics being  given     Status: None   Collection Time: 02/16/20 12:16 PM   Specimen: BLOOD LEFT HAND  Result Value Ref Range Status   Specimen Description BLOOD LEFT HAND  Final   Special Requests   Final    BOTTLES DRAWN AEROBIC AND ANAEROBIC Blood Culture adequate volume   Culture   Final    NO GROWTH 5 DAYS Performed at [facility], [place] [address] [place] [address], [place], [place] [address]    Report Status 02/21/2020 FINAL  Final    Radiology Studies: No results found.    Bailey Cox T. [place]  If 7PM-7AM, please contact night-coverage [email or web address] Password [facility] 02/21/2020, 1:44 PM

## 2020-02-21 NOTE — Progress Notes (Signed)
Admit: 01/05/2020 LOS: 13  66F prolonged AKI now ESRD  Subjective:  . Refused HD this AM . Says she'll tyr later today, including recliner . TRH and Palliative notes reviewed  04/16 0701 - 04/17 0700 In: 480 [P.O.:480] Out: -   Filed Weights   02/19/20 0444 02/19/20 1415 02/19/20 1750  Weight: 84.7 kg 85.1 kg 84.2 kg    Scheduled Meds: . acetaminophen (TYLENOL) oral liquid 160 mg/5 mL  1,000 mg Per Tube Q8H  . bisacodyl  10 mg Rectal Daily  . busPIRone  5 mg Per Tube BID  . calcium acetate  667 mg Oral TID WC  . camphor-menthol   Topical QID  . chlorhexidine gluconate (MEDLINE KIT)  15 mL Mouth Rinse BID  . Chlorhexidine Gluconate Cloth  6 each Topical Q0600  . [START ON 02/26/2020] darbepoetin (ARANESP) injection - DIALYSIS  200 mcg Subcutaneous Q Thu-HD  . feeding supplement (NEPRO CARB STEADY)  1,000 mL Per Tube Q24H  . feeding supplement (PRO-STAT SUGAR FREE 64)  30 mL Per Tube TID  . insulin aspart  0-15 Units Subcutaneous Q4H  . pantoprazole sodium  40 mg Per Tube BID  . PARoxetine  40 mg Per Tube Daily  . QUEtiapine  25 mg Per Tube BID  . sodium chloride flush  10-40 mL Intracatheter Q12H  . traZODone  25 mg Per Tube QHS   Continuous Infusions:  PRN Meds:.feeding supplement (NEPRO CARB STEADY), fentaNYL (SUBLIMAZE) injection, guaiFENesin-codeine, haloperidol lactate, HYDROcodone-acetaminophen, metoprolol tartrate, ondansetron (ZOFRAN) IV, phenol, promethazine, sodium chloride flush  Current Labs: reviewed    Physical Exam:  Blood pressure 122/63, pulse 88, temperature 98.2 F (36.8 C), temperature source Axillary, resp. rate (!) 24, height '5\' 4"'$  (1.626 m), weight 84.2 kg, SpO2 100 %. NAD Regular, nl s1s2 CTAB No sig LEE Trach site bandaged R IJ TDC  A 1. Dialysis dep't AKI now ESRD on THS schedule  2. Debility; full scope of care currenty 3. Difficulting with tolerating HD, not able to use recliner 4. Anemia of CKD; ESA qThur 5. Hx/o PEA arrest 6. Hx/o  trach, decannulated 7. Lacks capacity for medical decision making   P . Cont HD on THS schedule:  3.5h/ 400/800, 2K 1-2L UF, TDC, no heparin . Hb downtrendig, inc ESA dosing; rpt Fe levels . Cont to encourage HD in recliner . Daily weights, Daily Renal Panel, Strict I/Os, Avoid nephrotoxins (NSAIDs, judicious IV Contrast)  . Medication Issues; o Preferred narcotic agents for pain control are hydromorphone, fentanyl, and methadone. Morphine should not be used.  o Baclofen should be avoided o Avoid oral sodium phosphate and magnesium citrate based laxatives / bowel preps    Pearson Grippe MD 02/21/2020, 11:03 AM  Recent Labs  Lab 02/18/20 0227 02/19/20 0413 02/20/20 0318  NA 136 133* 135  K 3.0* 4.0 3.6  CL 95* 95* 95*  CO2 '30 27 24  '$ GLUCOSE 126* 125* 214*  BUN 41* 58* 36*  CREATININE 1.58* 2.06* 1.48*  CALCIUM 8.3* 8.6* 8.7*  PHOS  --  2.3* 2.1*   Recent Labs  Lab 02/18/20 0227 02/19/20 0413 02/20/20 0318  WBC 17.2* 13.8* 8.6  HGB 8.3* 7.8* 7.4*  HCT 29.0* 26.9* 25.0*  MCV 96.3 95.1 94.7  PLT 141* 130* PLATELET CLUMPS NOTED ON SMEAR, UNABLE TO ESTIMATE

## 2020-02-22 DIAGNOSIS — G9341 Metabolic encephalopathy: Secondary | ICD-10-CM

## 2020-02-22 DIAGNOSIS — J9601 Acute respiratory failure with hypoxia: Secondary | ICD-10-CM | POA: Diagnosis not present

## 2020-02-22 DIAGNOSIS — N179 Acute kidney failure, unspecified: Secondary | ICD-10-CM | POA: Diagnosis not present

## 2020-02-22 DIAGNOSIS — J9611 Chronic respiratory failure with hypoxia: Secondary | ICD-10-CM | POA: Diagnosis not present

## 2020-02-22 DIAGNOSIS — N184 Chronic kidney disease, stage 4 (severe): Secondary | ICD-10-CM | POA: Diagnosis not present

## 2020-02-22 LAB — IRON AND TIBC
Iron: 13 ug/dL — ABNORMAL LOW (ref 28–170)
Saturation Ratios: 5 % — ABNORMAL LOW (ref 10.4–31.8)
TIBC: 245 ug/dL — ABNORMAL LOW (ref 250–450)
UIBC: 232 ug/dL

## 2020-02-22 LAB — RENAL FUNCTION PANEL
Albumin: 2.3 g/dL — ABNORMAL LOW (ref 3.5–5.0)
Anion gap: 13 (ref 5–15)
BUN: 87 mg/dL — ABNORMAL HIGH (ref 8–23)
CO2: 25 mmol/L (ref 22–32)
Calcium: 9.1 mg/dL (ref 8.9–10.3)
Chloride: 94 mmol/L — ABNORMAL LOW (ref 98–111)
Creatinine, Ser: 2.47 mg/dL — ABNORMAL HIGH (ref 0.44–1.00)
GFR calc Af Amer: 22 mL/min — ABNORMAL LOW (ref >60)
GFR calc non Af Amer: 19 mL/min — ABNORMAL LOW (ref >60)
Glucose, Bld: 200 mg/dL — ABNORMAL HIGH (ref 70–99)
Phosphorus: 4.2 mg/dL (ref 2.5–4.6)
Potassium: 4.6 mmol/L (ref 3.5–5.1)
Sodium: 132 mmol/L — ABNORMAL LOW (ref 135–145)

## 2020-02-22 LAB — AMMONIA: Ammonia: 52 umol/L — ABNORMAL HIGH (ref 9–35)

## 2020-02-22 LAB — CBC
HCT: 27.2 % — ABNORMAL LOW (ref 36.0–46.0)
Hemoglobin: 7.6 g/dL — ABNORMAL LOW (ref 12.0–15.0)
MCH: 27 pg (ref 26.0–34.0)
MCHC: 27.9 g/dL — ABNORMAL LOW (ref 30.0–36.0)
MCV: 96.8 fL (ref 80.0–100.0)
Platelets: 106 10*3/uL — ABNORMAL LOW (ref 150–400)
RBC: 2.81 MIL/uL — ABNORMAL LOW (ref 3.87–5.11)
RDW: 17.2 % — ABNORMAL HIGH (ref 11.5–15.5)
WBC: 6.5 10*3/uL (ref 4.0–10.5)
nRBC: 0.5 % — ABNORMAL HIGH (ref 0.0–0.2)

## 2020-02-22 LAB — GLUCOSE, CAPILLARY
Glucose-Capillary: 139 mg/dL — ABNORMAL HIGH (ref 70–99)
Glucose-Capillary: 172 mg/dL — ABNORMAL HIGH (ref 70–99)
Glucose-Capillary: 187 mg/dL — ABNORMAL HIGH (ref 70–99)
Glucose-Capillary: 216 mg/dL — ABNORMAL HIGH (ref 70–99)
Glucose-Capillary: 241 mg/dL — ABNORMAL HIGH (ref 70–99)

## 2020-02-22 LAB — MAGNESIUM: Magnesium: 2.3 mg/dL (ref 1.7–2.4)

## 2020-02-22 LAB — VITAMIN B12: Vitamin B-12: 1158 pg/mL — ABNORMAL HIGH (ref 180–914)

## 2020-02-22 LAB — RPR: RPR Ser Ql: NONREACTIVE

## 2020-02-22 LAB — TSH: TSH: 2.498 u[IU]/mL (ref 0.350–4.500)

## 2020-02-22 MED ORDER — ALBUMIN HUMAN 25 % IV SOLN
INTRAVENOUS | Status: AC
  Administered 2020-02-22: 16:00:00 25 g via INTRAVENOUS
  Filled 2020-02-22: qty 100

## 2020-02-22 MED ORDER — ALBUMIN HUMAN 25 % IV SOLN
25.0000 g | Freq: Once | INTRAVENOUS | Status: AC
  Filled 2020-02-22: qty 100

## 2020-02-22 MED ORDER — INSULIN GLARGINE 100 UNIT/ML ~~LOC~~ SOLN
8.0000 [IU] | Freq: Two times a day (BID) | SUBCUTANEOUS | Status: DC
  Administered 2020-02-22 – 2020-03-08 (×27): 8 [IU] via SUBCUTANEOUS
  Filled 2020-02-22 (×35): qty 0.08

## 2020-02-22 MED ORDER — QUETIAPINE FUMARATE 25 MG PO TABS
25.0000 mg | ORAL_TABLET | Freq: Every day | ORAL | Status: DC
  Administered 2020-02-23 – 2020-02-24 (×2): 25 mg
  Filled 2020-02-22 (×2): qty 1

## 2020-02-22 MED ORDER — OXYCODONE HCL 5 MG PO TABS: 5.0000 mg | ORAL_TABLET | Freq: Three times a day (TID) | ORAL | Status: DC | PRN

## 2020-02-22 MED ORDER — LACTULOSE 10 GM/15ML PO SOLN
20.0000 g | Freq: Two times a day (BID) | ORAL | Status: DC
  Administered 2020-02-23 – 2020-02-24 (×4): 20 g
  Filled 2020-02-22 (×4): qty 30

## 2020-02-22 MED ORDER — HEPARIN SODIUM (PORCINE) 1000 UNIT/ML IJ SOLN
INTRAMUSCULAR | Status: AC
  Administered 2020-02-22: 16:00:00 1000 [IU]
  Filled 2020-02-22: qty 4

## 2020-02-22 MED ORDER — INSULIN ASPART 100 UNIT/ML ~~LOC~~ SOLN
2.0000 [IU] | SUBCUTANEOUS | Status: DC
  Administered 2020-02-22 – 2020-02-26 (×22): 2 [IU] via SUBCUTANEOUS

## 2020-02-22 NOTE — Progress Notes (Signed)
PROGRESS NOTE  Bailey Cox LHT:[id number] DOB: [birth year]   PCP: Patient, No Pcp Per  Patient is from: Home  DOA: 01/05/2020 LOS: 31  Brief Narrative / Interim history: 74 year old female with history of cholecystitis status post cholecystectomy in 9/20 at [facility].  She was discharged to SNF but rehospitalized to [facility] and had bile duct stent.  Discharged back to SNF but readmitted with Ogilive syndrome.  Hospital course complicated by fluid overload and transferred to [facility] for dialysis.  At [facility], developed respiratory failure requiring vent support.  She underwent trach.she was eventually discharged to [facility] on 3/1 after prolonged hospitalization at [facility] but deemed too sick, and sent to [facility] due to fluid overload and acidosis.  Patient was initially admitted to ICU.  Required CRRT 3/9-3/12.  Transfer to floor on 3/14.  She pulled her trach.  Now on oxygen by nasal cannula.  She is on IHD via [place].  Has refused dialysis at times but deemed to lack capacity.  Family wants full scope of care including full code and dialysis.  Ethics committee meeting held on 4/6.  Remains inpatient until she is able to tolerate HD on recliner chair and has outpatient HD spot.  She has been noncompliant with care and hemodialysis but lacks capacity.  Subjective: Seen and examined earlier this morning.  No major events overnight or this morning.  Reports pain all over but not a reliable historian.  Does not appear to be in distress.  She is awake but is slow.  Oriented only to self.  Follows commands.  Objective: Vitals:   02/21/20 2143 02/22/20 0022 02/22/20 0423 02/22/20 0756  BP:  (!) 108/54 113/77 (!) 126/53  Pulse: 89 90 91 90  Resp: (!) _0 (!) 21  Temp:  98.6 F (37 C) 98.3 F (36.8 C) 98 F (36.7 C)  TempSrc:  Oral Oral Oral  SpO2: 96% 98% 97% 98%  Weight:      Height:        Intake/Output Summary (Last 24 hours) at 02/22/2020 1105 Last data filed at  02/22/2020 0800 Gross per 24 hour  Intake 360 ml  Output --  Net 360 ml   Filed Weights   02/19/20 0444 02/19/20 1415 02/19/20 1750  Weight: 84.7 kg 85.1 kg 84.2 kg    Examination:  GENERAL: Chronically ill-appearing.  No apparent distress. HEENT: MMM.  Vision and hearing grossly intact.  NECK: Dressing over previous tracheostomy site DCI. RESP: 100% on 4 L by [place].  No IWOB.  Fair aeration bilaterally. CVS:  RRR. Heart sounds normal.  ABD/GI/GU: Bowel sounds present. Soft.  Mild diffuse tenderness.  PEG tube. MSK/EXT:  Moves extremities. No apparent deformity.  Trace edema. SKIN: no apparent skin lesion or wound NEURO: Awake but is slow.  Oriented only to self.  Follows commands.  No apparent focal neuro deficit. PSYCH: Calm.  Flat affect.  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 She also needs to tolerate HD in recliner chair for outpatient HD. -Family to stay at bedside during HD for compliance.  Also using as needed Haldol for HD. -Nephrology managing-plan for HD today  MRSA/Pseudomonas aeruginosa pneumonia-previously completed antibiotic course for this. Aspiration pneumonia 02/16/2020-respiratory  failure improving. -CXR on 4/14 with RML, RLL and Left mid lung opacities concerning for multifocal pneumonia -Completed 5 days of CTX and azithromycin on 4/16. -Aspiration precautions.  Acute on chronic hypoxic respiratory failure-likely due to aspiration pneumonia and fluid overload.  100% on 4 L by [place].  No respiratory distress.  Decannulated on 3/19. -Wean oxygen as able.  Minimal oxygen to keep  saturation above 88%.  Discussed with RN. -Continue encouraging CPT.  Acute metabolic encephalopathy-oriented only to self but follows command.  Awake but slow.  Multiple potential etiologies including azotemia, iatrogenic, delirium.. No focal neuro deficits.  No obvious infectious signs.  Just completed CTX and azithromycin for aspiration pneumonia. -Check ammonia, TSH, B12 and RPR. -Reduce Seroquel from 25 mg twice daily to nightly. -Consider weaning pain medications if possible -Frequent orientation and delirium precautions.  DM-2 with hyperglycemia: A1c 5.7%. Recent Labs    02/22/20 0021 02/22/20 0449 02/22/20 0759  GLUCAP 187* 216* 241*  -Continue SSI-moderate every 4 hours -Add NovoLog 2 units every 4 hours -Increase Lantus from 5 to 8 units twice daily  Essential hypertension: Normotensive -Continue PRN metoprolol  Anemia of chronic kidney disease: Hgb 9.4 (admit)>>> 7.4> 7.6 -ESA and IV iron per nephrology  Hypokalemia: Resolved. -Continue monitoring  Hyponatremia: Relatively stable.  Anxiety/depression/agitation -Continue Paxil, BuSpar, trazodone -Frequent orientation and delirium precautions -As needed Haldol and scheduled Seroquel.   GERD/constipation -Continue PPI and bowel regimen  Mild thrombocytopenia: Stable. -Continue monitoring  Generalized weakness/debility -PT/OT-not able to participate in therapy.  Requiring mechanical lift to chair.  Chronic pain: -Scheduled Tylenol 1000 mg 3 times daily -Changed as needed Norco to oxycodone 5 mg every 8 hours as needed -Continue IV fentanyl as needed  Goals of care/Ethical issues-complex medical issue with guarded prognosis.  Daughter wishes her to be full code with full scope of care including dialysis.  Patient refused HD previously but deemed to lack capacity by psych.  Has been refusing to commit to dialysis on recliner chair. -Palliative care following -Ethics involved previously.  Nutritional  support/dysphagia -Appreciate input by SLP-recommended dysphagia 2 diet. -Appreciate input by dietitian -Continue TF and dysphagia 2 diet Nutrition Problem: Inadequate oral intake Etiology: inability to eat  Signs/Symptoms: NPO status  Interventions: Tube feeding   Pressure Injury 02/16/20 Coccyx Stage 1 -  Intact skin with non-blanchable redness of a localized area usually over a bony prominence. (Active)  02/16/20 1700  Location: Coccyx  Location Orientation:   Staging: Stage 1 -  Intact skin with non-blanchable redness of a localized area usually over a bony prominence.  Wound Description (Comments):   Present on Admission:      DVT prophylaxis: Start subcu heparin Code Status: Full code Family Communication: None at bedside.    Discharge barrier: Inability/refusal to dialyze on recliner chair and outpatient dialysis spot Patient is from: [facility] Final disposition: Likely SNF when she tolerates HD in recliner chair for outpatient HD.  Consultants:  PCCM (off) Nephrology PMT   Sch Meds:  Scheduled Meds: . acetaminophen (TYLENOL) oral liquid 160 mg/5 mL  1,000 mg Per Tube Q8H  . bisacodyl  10 mg Rectal Daily  . busPIRone  5 mg Per Tube BID  . calcium acetate  667 mg Oral TID WC  . camphor-menthol   Topical QID  . chlorhexidine gluconate (MEDLINE KIT)  15 mL Mouth Rinse BID  . Chlorhexidine Gluconate Cloth  6 each Topical Q0600  . [START ON 02/26/2020] darbepoetin (ARANESP) injection - DIALYSIS  200 mcg  Subcutaneous Q Thu-HD  . feeding supplement (NEPRO CARB STEADY)  1,000 mL Per Tube Q24H  . feeding supplement (PRO-STAT SUGAR FREE 64)  30 mL Per Tube TID  . insulin aspart  0-15 Units Subcutaneous Q4H  . insulin aspart  2 Units Subcutaneous Q4H  . insulin glargine  8 Units Subcutaneous BID  . pantoprazole sodium  40 mg Per Tube BID  . PARoxetine  40 mg Per Tube Daily  . QUEtiapine  25 mg Per Tube BID  . sodium chloride flush  10-40 mL Intracatheter Q12H    . traZODone  25 mg Per Tube QHS   Continuous Infusions:  PRN Meds:.feeding supplement (NEPRO CARB STEADY), fentaNYL (SUBLIMAZE) injection, guaiFENesin, guaiFENesin-codeine, haloperidol lactate, metoprolol tartrate, ondansetron (ZOFRAN) IV, oxyCODONE, phenol, promethazine, sodium chloride flush  Antimicrobials: Anti-infectives (From admission, onward)   Start     Dose/Rate Route Frequency Ordered Stop   02/16/20 1230  cefTRIAXone (ROCEPHIN) 2 g in sodium chloride 0.9 % 100 mL IVPB     2 g 200 mL/hr over 30 Minutes Intravenous Every 24 hours 02/16/20 1127 02/20/20 0941   02/16/20 1230  azithromycin (ZITHROMAX) 500 mg in sodium chloride 0.9 % 250 mL IVPB     500 mg 250 mL/hr over 60 Minutes Intravenous Every 24 hours 02/16/20 1127 02/20/20 1140   01/10/20 0930  vancomycin (VANCOCIN) IVPB 1000 mg/200 mL premix  Status:  Discontinued     1,000 mg 200 mL/hr over 60 Minutes Intravenous  Once 01/10/20 0915 01/13/20 0623   01/06/20 2200  ceFEPIme (MAXIPIME) 2 g in sodium chloride 0.9 % 100 mL IVPB     2 g 200 mL/hr over 30 Minutes Intravenous Every 24 hours 01/05/20 2251 01/11/20 2234   01/05/20 2200  ceFEPIme (MAXIPIME) 2 g in sodium chloride 0.9 % 100 mL IVPB     2 g 200 mL/hr over 30 Minutes Intravenous  Once 01/05/20 2148 01/06/20 0015   01/05/20 2200  metroNIDAZOLE (FLAGYL) IVPB 500 mg     500 mg 100 mL/hr over 60 Minutes Intravenous  Once 01/05/20 2148 01/06/20 0015   01/05/20 2200  vancomycin (VANCOCIN) IVPB 1000 mg/200 mL premix  Status:  Discontinued     1,000 mg 200 mL/hr over 60 Minutes Intravenous  Once 01/05/20 2148 01/05/20 2151   01/05/20 2200  vancomycin (VANCOREADY) IVPB 2000 mg/400 mL  Status:  Discontinued     2,000 mg 200 mL/hr over 120 Minutes Intravenous  Once 01/05/20 2151 01/05/20 2223       I have personally reviewed the following labs and images: CBC: Recent Labs  Lab 02/18/20 0227 02/19/20 0413 02/20/20 0318 02/21/20 1152 02/22/20 0029  WBC 17.2*  13.8* 8.6 7.4 6.5  HGB 8.3* 7.8* 7.4* 7.4* 7.6*  HCT 29.0* 26.9* 25.0* 26.4* 27.2*  MCV 96.3 95.1 94.7 96.7 96.8  PLT 141* 130* PLATELET CLUMPS NOTED ON SMEAR, UNABLE TO ESTIMATE 105* 106*   BMP &GFR Recent Labs  Lab 02/18/20 0227 02/19/20 0413 02/20/20 0318 02/21/20 1152 02/22/20 0029  NA 136 133* 135 133* 132*  K 3.0* 4.0 3.6 4.3 4.6  CL 95* 95* 95* 93* 94*  CO2 _0 GLUCOSE 126* 125* 214* 228* 200*  BUN 41* 58* 36* 71* 87*  CREATININE 1.58* 2.06* 1.48* 2.19* 2.47*  CALCIUM 8.3* 8.6* 8.7* 9.0 9.1  MG  --  2.2 1.9 2.2 2.3  PHOS  --  2.3* 2.1* 4.1 4.2   Estimated Creatinine Clearance: 21.9 mL/min (A) (by C-G  formula based on SCr of 2.47 mg/dL (H)). Liver & Pancreas: Recent Labs  Lab 02/19/20 0413 02/20/20 0318 02/21/20 1152 02/22/20 0029  AST 20  --   --   --   ALT 17  --   --   --   ALKPHOS 112  --   --   --   BILITOT 0.5  --   --   --   PROT 6.1*  --   --   --   ALBUMIN 2.4*  2.5* 2.4* 2.1* 2.3*   No results for input(s): LIPASE, AMYLASE in the last 168 hours. No results for input(s): AMMONIA in the last 168 hours. Diabetic: No results for input(s): HGBA1C in the last 72 hours. Recent Labs  Lab 02/21/20 1633 02/21/20 2026 02/22/20 0021 02/22/20 0449 02/22/20 0759  GLUCAP 176* 139* 187* 216* 241*   Cardiac Enzymes: No results for input(s): CKTOTAL, CKMB, CKMBINDEX, TROPONINI in the last 168 hours. No results for input(s): PROBNP in the last 8760 hours. Coagulation Profile: No results for input(s): INR, PROTIME in the last 168 hours. Thyroid Function Tests: No results for input(s): TSH, T4TOTAL, FREET4, T3FREE, THYROIDAB in the last 72 hours. Lipid Profile: No results for input(s): CHOL, HDL, LDLCALC, TRIG, CHOLHDL, LDLDIRECT in the last 72 hours. Anemia Panel: No results for input(s): VITAMINB12, FOLATE, FERRITIN, TIBC, IRON, RETICCTPCT in the last 72 hours. Urine analysis:    Component Value Date/Time   COLORURINE AMBER (A) 01/13/2020  1302   APPEARANCEUR TURBID (A) 01/13/2020 1302   LABSPEC 1.018 01/13/2020 1302   PHURINE 5.0 01/13/2020 1302   GLUCOSEU NEGATIVE 01/13/2020 1302   HGBUR SMALL (A) 01/13/2020 1302   BILIRUBINUR NEGATIVE 01/13/2020 1302   KETONESUR NEGATIVE 01/13/2020 1302   PROTEINUR >=300 (A) 01/13/2020 1302   NITRITE NEGATIVE 01/13/2020 1302   LEUKOCYTESUR LARGE (A) 01/13/2020 1302   Sepsis Labs: Invalid input(s): PROCALCITONIN, [place]  Microbiology: Recent Results (from the past 240 hour(s))  Culture, blood (routine x 2) Call MD if unable to obtain prior to antibiotics being given     Status: None   Collection Time: 02/16/20 12:07 PM   Specimen: BLOOD LEFT HAND  Result Value Ref Range Status   Specimen Description BLOOD LEFT HAND  Final   Special Requests   Final    BOTTLES DRAWN AEROBIC AND ANAEROBIC Blood Culture results may not be optimal due to an inadequate volume of blood received in culture bottles   Culture   Final    NO GROWTH 5 DAYS Performed at [facility], [place] [address], [place], [place] [address]    Report Status 02/21/2020 FINAL  Final  Culture, blood (routine x 2) Call MD if unable to obtain prior to antibiotics being given     Status: None   Collection Time: 02/16/20 12:16 PM   Specimen: BLOOD LEFT HAND  Result Value Ref Range Status   Specimen Description BLOOD LEFT HAND  Final   Special Requests   Final    BOTTLES DRAWN AEROBIC AND ANAEROBIC Blood Culture adequate volume   Culture   Final    NO GROWTH 5 DAYS Performed at [facility], [place] [address], [place], [place] [address]    Report Status 02/21/2020 FINAL  Final    Radiology Studies: No results found.    Martyna Thorns T. Komatke  If 7PM-7AM, please contact night-coverage [email or web address] Password [facility] 02/22/2020, 11:05 AM

## 2020-02-22 NOTE — Progress Notes (Signed)
Pt. Tolerated Hd tx 3.5 hours in reclining chair

## 2020-02-22 NOTE — Progress Notes (Signed)
Pt. In reclining chair for HD tx.

## 2020-02-22 NOTE — Progress Notes (Signed)
Pt. uf off as documented. BP running soft as documented. Dr. Joelyn Oms called and made aware. Orders received for albumin as documented.pt. stable

## 2020-02-22 NOTE — Progress Notes (Signed)
Admit: 01/05/2020 LOS: 43  66F prolonged AKI now ESRD  Subjective:  . For HD today  04/17 0701 - 04/18 0700 In: 360 [NG/GT:360] Out: -   Filed Weights   02/19/20 0444 02/19/20 1415 02/19/20 1750  Weight: 84.7 kg 85.1 kg 84.2 kg    Scheduled Meds: . acetaminophen (TYLENOL) oral liquid 160 mg/5 mL  1,000 mg Per Tube Q8H  . bisacodyl  10 mg Rectal Daily  . busPIRone  5 mg Per Tube BID  . calcium acetate  667 mg Oral TID WC  . camphor-menthol   Topical QID  . chlorhexidine gluconate (MEDLINE KIT)  15 mL Mouth Rinse BID  . Chlorhexidine Gluconate Cloth  6 each Topical Q0600  . [START ON 02/26/2020] darbepoetin (ARANESP) injection - DIALYSIS  200 mcg Subcutaneous Q Thu-HD  . feeding supplement (NEPRO CARB STEADY)  1,000 mL Per Tube Q24H  . feeding supplement (PRO-STAT SUGAR FREE 64)  30 mL Per Tube TID  . insulin aspart  0-15 Units Subcutaneous Q4H  . insulin aspart  2 Units Subcutaneous Q4H  . insulin glargine  8 Units Subcutaneous BID  . pantoprazole sodium  40 mg Per Tube BID  . PARoxetine  40 mg Per Tube Daily  . [START ON 02/23/2020] QUEtiapine  25 mg Per Tube QHS  . sodium chloride flush  10-40 mL Intracatheter Q12H  . traZODone  25 mg Per Tube QHS   Continuous Infusions:  PRN Meds:.feeding supplement (NEPRO CARB STEADY), fentaNYL (SUBLIMAZE) injection, guaiFENesin, guaiFENesin-codeine, haloperidol lactate, metoprolol tartrate, ondansetron (ZOFRAN) IV, oxyCODONE, phenol, promethazine, sodium chloride flush  Current Labs: reviewed    Physical Exam:  Blood pressure (!) 126/53, pulse 90, temperature 98 F (36.7 C), temperature source Oral, resp. rate (!) 21, height '5\' 4"'$  (1.626 m), weight 84.2 kg, SpO2 98 %. NAD Regular, nl s1s2 CTAB No sig LEE Trach site bandaged R IJ TDC  A 1. Dialysis dep't AKI now ESRD on THS schedule  2. Debility; full scope of care currenty 3. Difficulting with tolerating HD, not willing to use recliner 4. Anemia of CKD; ESA qThur 5. Hx/o PEA  arrest 6. Hx/o trach, decannulated 7. Lacks capacity for medical decision making   P . Cont HD rolloever, on THS schedule:  3.5h/ 400/800, 2K 1-2L UF, TDC, no heparin . HD is not improving her quality of life . Hb downtrendig, inc ESA dosing; rpt Fe levels today . Cont to encourage HD in recliner . Daily weights, Daily Renal Panel, Strict I/Os, Avoid nephrotoxins (NSAIDs, judicious IV Contrast)  . Medication Issues; o Preferred narcotic agents for pain control are hydromorphone, fentanyl, and methadone. Morphine should not be used.  o Baclofen should be avoided o Avoid oral sodium phosphate and magnesium citrate based laxatives / bowel preps    Pearson Grippe MD 02/22/2020, 11:27 AM  Recent Labs  Lab 02/20/20 0318 02/21/20 1152 02/22/20 0029  NA 135 133* 132*  K 3.6 4.3 4.6  CL 95* 93* 94*  CO2 '24 28 25  '$ GLUCOSE 214* 228* 200*  BUN 36* 71* 87*  CREATININE 1.48* 2.19* 2.47*  CALCIUM 8.7* 9.0 9.1  PHOS 2.1* 4.1 4.2   Recent Labs  Lab 02/20/20 0318 02/21/20 1152 02/22/20 0029  WBC 8.6 7.4 6.5  HGB 7.4* 7.4* 7.6*  HCT 25.0* 26.4* 27.2*  MCV 94.7 96.7 96.8  PLT PLATELET CLUMPS NOTED ON SMEAR, UNABLE TO ESTIMATE 105* 106*

## 2020-02-23 DIAGNOSIS — N179 Acute kidney failure, unspecified: Secondary | ICD-10-CM | POA: Diagnosis not present

## 2020-02-23 DIAGNOSIS — J9601 Acute respiratory failure with hypoxia: Secondary | ICD-10-CM | POA: Diagnosis not present

## 2020-02-23 DIAGNOSIS — N184 Chronic kidney disease, stage 4 (severe): Secondary | ICD-10-CM | POA: Diagnosis not present

## 2020-02-23 DIAGNOSIS — J9611 Chronic respiratory failure with hypoxia: Secondary | ICD-10-CM | POA: Diagnosis not present

## 2020-02-23 LAB — MAGNESIUM: Magnesium: 2 mg/dL (ref 1.7–2.4)

## 2020-02-23 LAB — RENAL FUNCTION PANEL
Albumin: 2.5 g/dL — ABNORMAL LOW (ref 3.5–5.0)
Anion gap: 12 (ref 5–15)
BUN: 51 mg/dL — ABNORMAL HIGH (ref 8–23)
CO2: 27 mmol/L (ref 22–32)
Calcium: 9.3 mg/dL (ref 8.9–10.3)
Chloride: 96 mmol/L — ABNORMAL LOW (ref 98–111)
Creatinine, Ser: 1.67 mg/dL — ABNORMAL HIGH (ref 0.44–1.00)
GFR calc Af Amer: 35 mL/min — ABNORMAL LOW (ref >60)
GFR calc non Af Amer: 30 mL/min — ABNORMAL LOW (ref >60)
Glucose, Bld: 108 mg/dL — ABNORMAL HIGH (ref 70–99)
Phosphorus: 3.2 mg/dL (ref 2.5–4.6)
Potassium: 4.2 mmol/L (ref 3.5–5.1)
Sodium: 135 mmol/L (ref 135–145)

## 2020-02-23 LAB — GLUCOSE, CAPILLARY
Glucose-Capillary: 192 mg/dL — ABNORMAL HIGH (ref 70–99)
Glucose-Capillary: 178 mg/dL — ABNORMAL HIGH (ref 70–99)
Glucose-Capillary: 114 mg/dL — ABNORMAL HIGH (ref 70–99)
Glucose-Capillary: 177 mg/dL — ABNORMAL HIGH (ref 70–99)
Glucose-Capillary: 185 mg/dL — ABNORMAL HIGH (ref 70–99)
Glucose-Capillary: 174 mg/dL — ABNORMAL HIGH (ref 70–99)

## 2020-02-23 MED ORDER — FENTANYL CITRATE (PF) 100 MCG/2ML IJ SOLN: 50.0000 ug | INTRAMUSCULAR | Status: DC | PRN

## 2020-02-23 NOTE — Progress Notes (Signed)
PROGRESS NOTE  Bailey Cox CXK:[id number] DOB: 18-May-1946   PCP: Patient, No Pcp Per  Patient is from: Home  DOA: 01/05/2020 LOS: 60  Brief Narrative / Interim history: 74 year old female with history of cholecystitis status post cholecystectomy in 9/20 at [facility].  She was discharged to SNF but rehospitalized to [facility] and had bile duct stent.  Discharged back to SNF but readmitted with Ogilive syndrome.  Hospital course complicated by fluid overload and transferred to [facility] for dialysis.  At [facility], developed respiratory failure requiring vent support.  She underwent trach.she was eventually discharged to [facility] on 3/1 after prolonged hospitalization at [facility] but deemed too sick, and sent to [facility] due to fluid overload and acidosis.  Patient was initially admitted to ICU.  Required CRRT 3/9-3/12.  Transfer to floor on 3/14.  She pulled her trach.  Now on oxygen by nasal cannula.  She is on IHD via [place].  Has refused dialysis at times but deemed to lack capacity.  Family wants full scope of care including full code and dialysis.  Ethics committee meeting held on 4/6.  Remains inpatient until she is able to tolerate HD on recliner chair and has outpatient HD spot.  She has been noncompliant with care and hemodialysis but lacks capacity.  Subjective: Seen and examined earlier this morning.  No major events overnight of this morning.  Complains pain all over but not able to localize.  She says she does not feel good but could not be specific.  She is somewhat drowsy.  Only oriented to self.  She knows she is in the hospital but does not know the name.  Follows some commands.  Does not appear to be in distress.  Objective: Vitals:   02/23/20 0500 02/23/20 0800 02/23/20 1050 02/23/20 1200  BP:  (!) 127/54 (!) 126/51 (!) 136/55  Pulse:  90 93 89  Resp:  '20 20 20  '$ Temp:  98.9 F (37.2 C) 99 F (37.2 C)   TempSrc:  Oral Axillary   SpO2:  96% 90% 96%  Weight: 89.7 kg      Height:        Intake/Output Summary (Last 24 hours) at 02/23/2020 1217 Last data filed at 02/22/2020 1706 Gross per 24 hour  Intake --  Output 511 ml  Net -511 ml   Filed Weights   02/19/20 1415 02/19/20 1750 02/23/20 0500  Weight: 85.1 kg 84.2 kg 89.7 kg    Examination:  GENERAL: Chronically ill-appearing.  No apparent distress. HEENT: MMM.  Vision and hearing grossly intact.  NECK: Dressing over previous tracheostomy site DCI. RESP: 100% on 4 L by [place].  No IWOB.  Fair aeration bilaterally. CVS:  RRR. Heart sounds normal.  ABD/GI/GU: Bowel sounds present. Soft.  Mild diffuse tenderness.  PEG tube. MSK/EXT:  Moves extremities. No apparent deformity.  Trace edema. SKIN: no apparent skin lesion or wound NEURO: Awake but is slow.  Oriented only to self.  Follows commands.  No apparent focal neuro deficit. PSYCH: Calm.  Flat affect.  GENERAL: Chronically ill-appearing.  No apparent distress. HEENT: MMM.  Vision and hearing grossly intact.  NECK: Dressing over previous tracheostomy site DCI. RESP: 96% on 3 L by [place].  No IWOB.  Fair aeration bilaterally. CVS:  RRR. Heart sounds normal.  ABD/GI/GU: Bowel sounds present. Soft.  Mild diffuse tenderness.  PEG tube. MSK/EXT:  Moves extremities. No apparent deformity.  Trace edema in all extremities. SKIN: no apparent skin lesion or wound NEURO:  Awake but slow and drowsy.  Oriented to self.  Knows she is in the hospital.  Follows some commands.  No apparent focal neuro deficit. PSYCH: Drowsy.  Flat affect.  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  Had  HD in recliner chair for the first time yesterday. -Family to stay at bedside during HD for compliance.  Also using as needed Haldol for HD. -Nephrology managing  MRSA/Pseudomonas aeruginosa pneumonia-previously completed antibiotic course for this. Aspiration pneumonia 02/16/2020-respiratory failure improving. -CXR on 4/14 with RML, RLL and Left mid lung opacities concerning for multifocal pneumonia -Completed 5 days of CTX and azithromycin on 4/16. -Aspiration precautions.  Acute on chronic hypoxic respiratory failure-likely due to aspiration pneumonia and fluid overload.  Saturating in mid 90s on 3 L by [place].  No respiratory distress.  Decannulated on 3/19. -Wean oxygen as able.  Minimal oxygen to keep saturation above 88%.  -Continue encouraging CPT.  Acute metabolic encephalopathy-oriented only to self but follows command.  Awake but slow.  Multiple potential etiologies including azotemia, iatrogenic, delirium.. No focal neuro deficits.  No obvious infectious signs.  Just completed CTX and azithromycin for aspiration pneumonia.  Ammonia slightly elevated.  TSH, B12 and RPR within normal. -Lactulose 20 g twice daily -Reduced Seroquel from 25 mg twice daily to nightly on 4/18. -We will consider weaning pain medications slowly. -Frequent orientation and delirium precautions.  DM-2 with hyperglycemia: A1c 5.7%. Recent Labs    02/23/20 0425 02/23/20 0803 02/23/20 1147  GLUCAP 114* 177* 185*  -Continue SSI-moderate every 4 hours -Continue NovoLog 2 units every 4 hours -Continue Lantus 8 units twice daily.  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  Iron sat 5%. -ESA and IV iron per nephrology  Hypokalemia: Resolved. -Continue monitoring  Hyponatremia: Resolved.  Anxiety/depression/agitation -Continue Paxil, BuSpar, trazodone -Frequent orientation and delirium  precautions -As needed Haldol and scheduled Seroquel.   GERD/constipation -Continue PPI and bowel regimen  Mild thrombocytopenia: Stable. -Continue monitoring  Generalized weakness/debility -PT/OT-not able to participate in therapy.  Requiring mechanical lift to chair.  Chronic pain: -Scheduled Tylenol 1000 mg 3 times daily -Changed as needed Norco to oxycodone 5 mg every 8 hours as needed -Continue IV fentanyl as needed  Goals of care/Ethical issues-complex medical issue with guarded prognosis.  Daughter wishes her to be full code with full scope of care including dialysis.  Patient refused HD previously but deemed to lack capacity by psych.  Has been refusing to commit to dialysis on recliner chair. -Palliative care following -Ethics involved previously.  Nutritional support/dysphagia -Appreciate input by SLP-recommended dysphagia 2 diet. -Appreciate input by dietitian -Continue TF and dysphagia 2 diet Nutrition Problem: Inadequate oral intake Etiology: inability to eat  Signs/Symptoms: NPO status  Interventions: Tube feeding   Pressure Injury 02/16/20 Coccyx Stage 1 -  Intact skin with non-blanchable redness of a localized area usually over a bony prominence. (Active)  02/16/20 1700  Location: Coccyx  Location Orientation:   Staging: Stage 1 -  Intact skin with non-blanchable redness of a localized area usually over a bony prominence.  Wound Description (Comments):   Present on Admission:  DVT prophylaxis: Start subcu heparin Code Status: Full code Family Communication: None at bedside.    Discharge barrier: Needs outpatient HD spot Patient is from: [facility] Final disposition: Likely SNF when she has outpatient HD and cleared by nephrology.  Consultants:  PCCM (off) Nephrology PMT   Sch Meds:  Scheduled Meds: . acetaminophen (TYLENOL) oral liquid 160 mg/5 mL  1,000 mg Per Tube Q8H  . bisacodyl  10 mg Rectal Daily  . busPIRone  5 mg Per  Tube BID  . calcium acetate  667 mg Oral TID WC  . camphor-menthol   Topical QID  . chlorhexidine gluconate (MEDLINE KIT)  15 mL Mouth Rinse BID  . Chlorhexidine Gluconate Cloth  6 each Topical Q0600  . [START ON 02/26/2020] darbepoetin (ARANESP) injection - DIALYSIS  200 mcg Subcutaneous Q Thu-HD  . feeding supplement (NEPRO CARB STEADY)  1,000 mL Per Tube Q24H  . feeding supplement (PRO-STAT SUGAR FREE 64)  30 mL Per Tube TID  . insulin aspart  0-15 Units Subcutaneous Q4H  . insulin aspart  2 Units Subcutaneous Q4H  . insulin glargine  8 Units Subcutaneous BID  . lactulose  20 g Per Tube BID  . pantoprazole sodium  40 mg Per Tube BID  . PARoxetine  40 mg Per Tube Daily  . QUEtiapine  25 mg Per Tube QHS  . sodium chloride flush  10-40 mL Intracatheter Q12H  . traZODone  25 mg Per Tube QHS   Continuous Infusions:  PRN Meds:.feeding supplement (NEPRO CARB STEADY), fentaNYL (SUBLIMAZE) injection, guaiFENesin, guaiFENesin-codeine, haloperidol lactate, metoprolol tartrate, ondansetron (ZOFRAN) IV, oxyCODONE, phenol, promethazine, sodium chloride flush  Antimicrobials: Anti-infectives (From admission, onward)   Start     Dose/Rate Route Frequency Ordered Stop   02/16/20 1230  cefTRIAXone (ROCEPHIN) 2 g in sodium chloride 0.9 % 100 mL IVPB     2 g 200 mL/hr over 30 Minutes Intravenous Every 24 hours 02/16/20 1127 02/20/20 0941   02/16/20 1230  azithromycin (ZITHROMAX) 500 mg in sodium chloride 0.9 % 250 mL IVPB     500 mg 250 mL/hr over 60 Minutes Intravenous Every 24 hours 02/16/20 1127 02/20/20 1140   01/10/20 0930  vancomycin (VANCOCIN) IVPB 1000 mg/200 mL premix  Status:  Discontinued     1,000 mg 200 mL/hr over 60 Minutes Intravenous  Once 01/10/20 0915 01/13/20 0623   01/06/20 2200  ceFEPIme (MAXIPIME) 2 g in sodium chloride 0.9 % 100 mL IVPB     2 g 200 mL/hr over 30 Minutes Intravenous Every 24 hours 01/05/20 2251 01/11/20 2234   01/05/20 2200  ceFEPIme (MAXIPIME) 2 g in sodium  chloride 0.9 % 100 mL IVPB     2 g 200 mL/hr over 30 Minutes Intravenous  Once 01/05/20 2148 01/06/20 0015   01/05/20 2200  metroNIDAZOLE (FLAGYL) IVPB 500 mg     500 mg 100 mL/hr over 60 Minutes Intravenous  Once 01/05/20 2148 01/06/20 0015   01/05/20 2200  vancomycin (VANCOCIN) IVPB 1000 mg/200 mL premix  Status:  Discontinued     1,000 mg 200 mL/hr over 60 Minutes Intravenous  Once 01/05/20 2148 01/05/20 2151   01/05/20 2200  vancomycin (VANCOREADY) IVPB 2000 mg/400 mL  Status:  Discontinued     2,000 mg 200 mL/hr over 120 Minutes Intravenous  Once 01/05/20 2151 01/05/20 2223       I have personally reviewed the following labs and images: CBC: Recent Labs  Lab 02/18/20 0227 02/19/20 0413 02/20/20 0318 02/21/20 1152  02/22/20 0029  WBC 17.2* 13.8* 8.6 7.4 6.5  HGB 8.3* 7.8* 7.4* 7.4* 7.6*  HCT 29.0* 26.9* 25.0* 26.4* 27.2*  MCV 96.3 95.1 94.7 96.7 96.8  PLT 141* 130* PLATELET CLUMPS NOTED ON SMEAR, UNABLE TO ESTIMATE 105* 106*   BMP &GFR Recent Labs  Lab 02/19/20 0413 02/20/20 0318 02/21/20 1152 02/22/20 0029 02/23/20 0318  NA 133* 135 133* 132* 135  K 4.0 3.6 4.3 4.6 4.2  CL 95* 95* 93* 94* 96*  CO2 '27 24 28 25 27  '$ GLUCOSE 125* 214* 228* 200* 108*  BUN 58* 36* 71* 87* 51*  CREATININE 2.06* 1.48* 2.19* 2.47* 1.67*  CALCIUM 8.6* 8.7* 9.0 9.1 9.3  MG 2.2 1.9 2.2 2.3 2.0  PHOS 2.3* 2.1* 4.1 4.2 3.2   Estimated Creatinine Clearance: 33.5 mL/min (A) (by C-G formula based on SCr of 1.67 mg/dL (H)). Liver & Pancreas: Recent Labs  Lab 02/19/20 0413 02/20/20 0318 02/21/20 1152 02/22/20 0029 02/23/20 0318  AST 20  --   --   --   --   ALT 17  --   --   --   --   ALKPHOS 112  --   --   --   --   BILITOT 0.5  --   --   --   --   PROT 6.1*  --   --   --   --   ALBUMIN 2.4*  2.5* 2.4* 2.1* 2.3* 2.5*   No results for input(s): LIPASE, AMYLASE in the last 168 hours. Recent Labs  Lab 02/22/20 1129  AMMONIA 52*   Diabetic: No results for input(s): HGBA1C in the  last 72 hours. Recent Labs  Lab 02/22/20 2055 02/22/20 2327 02/23/20 0425 02/23/20 0803 02/23/20 1147  GLUCAP 192* 178* 114* 177* 185*   Cardiac Enzymes: No results for input(s): CKTOTAL, CKMB, CKMBINDEX, TROPONINI in the last 168 hours. No results for input(s): PROBNP in the last 8760 hours. Coagulation Profile: No results for input(s): INR, PROTIME in the last 168 hours. Thyroid Function Tests: Recent Labs    02/22/20 1129  TSH 2.498   Lipid Profile: No results for input(s): CHOL, HDL, LDLCALC, TRIG, CHOLHDL, LDLDIRECT in the last 72 hours. Anemia Panel: Recent Labs    02/22/20 1129 02/22/20 1349  VITAMINB12 1,158*  --   TIBC  --  245*  IRON  --  13*   Urine analysis:    Component Value Date/Time   COLORURINE AMBER (A) 01/13/2020 1302   APPEARANCEUR TURBID (A) 01/13/2020 1302   LABSPEC 1.018 01/13/2020 1302   PHURINE 5.0 01/13/2020 1302   GLUCOSEU NEGATIVE 01/13/2020 1302   HGBUR SMALL (A) 01/13/2020 1302   BILIRUBINUR NEGATIVE 01/13/2020 1302   KETONESUR NEGATIVE 01/13/2020 1302   PROTEINUR >=300 (A) 01/13/2020 1302   NITRITE NEGATIVE 01/13/2020 1302   LEUKOCYTESUR LARGE (A) 01/13/2020 1302   Sepsis Labs: Invalid input(s): PROCALCITONIN, [place]  Microbiology: Recent Results (from the past 240 hour(s))  Culture, blood (routine x 2) Call MD if unable to obtain prior to antibiotics being given     Status: None   Collection Time: 02/16/20 12:07 PM   Specimen: BLOOD LEFT HAND  Result Value Ref Range Status   Specimen Description BLOOD LEFT HAND  Final   Special Requests   Final    BOTTLES DRAWN AEROBIC AND ANAEROBIC Blood Culture results may not be optimal due to an inadequate volume of blood received in culture bottles   Culture   Final  NO GROWTH 5 DAYS Performed at [facility], [place] [address] [place], [place] [address]    Report Status 02/21/2020 FINAL  Final  Culture, blood (routine x 2) Call MD if unable to obtain prior to antibiotics  being given     Status: None   Collection Time: 02/16/20 12:16 PM   Specimen: BLOOD LEFT HAND  Result Value Ref Range Status   Specimen Description BLOOD LEFT HAND  Final   Special Requests   Final    BOTTLES DRAWN AEROBIC AND ANAEROBIC Blood Culture adequate volume   Culture   Final    NO GROWTH 5 DAYS Performed at [facility], [place] [address], [place], [place] [address]    Report Status 02/21/2020 FINAL  Final    Radiology Studies: No results found.    Manya Balash T. [place]  If 7PM-7AM, please contact night-coverage [email or web address] Password [facility] 02/23/2020, 12:17 PM

## 2020-02-23 NOTE — Progress Notes (Signed)
Palliative Medicine RN Note: Discussed pt in weekly rounds. Note that patient is more tolerant of HD with planned presence of family and that she sat in the chair for HD 4/18. At this time, GOC are clear, and path forward seems set. Our team will follow from a distance.   If new needs arise, or if there are sudden changes requiring PMT involvement, please call our office at [phone].  Marjie Skiff Torie Towle, RN, BSN, [facility] Palliative Medicine Team 02/23/2020 11:36 AM Office [phone]

## 2020-02-23 NOTE — Progress Notes (Signed)
Renal Navigator notes that patient tolerated full HD treatment in a recliner yesterday without the need for sedating medication. Navigator contacted patient's daughter [name] to update her. She states she is coming tomorrow, Tuesday, and staying overnight until Wednesday. Navigator asks that patient can be run TWS this week for HD so that her family can support her/see how difficult HD is for her as much as possible. Renal Navigator spoke to [place] about the possibility of completing OP HD referral now that patient has completed treatment in a recliner, un-medicated. [name] states she does not know which facility her mother will be going to, but she is certain that she will not agree with "[facility]" as her mother had a heart attack there and the staff "just rolled over and went back to sleep." Navigator's understanding from our Ethics Meeting was that patient's daughter planned to take patient home since a SNF would not be able to force patient to go to HD, but she is now stating again that SNF is the plan. Navigator relayed message to [place] that her transport from [facility] to SNF would be required to be paid out of pocket and [name] states that no one has ever said this before. Navigator states that per CSW, her niece Autumn was informed of this. [name] wants to know why. Navigator will pass this question along to CSW, as well as the SNF she is not interested in.  Navigator will pursue OP HD clinic seat once SNF has been determined, though may initiate referral to HD clinic near patient's home as a place to start now that patient is tolerating treatment.  Alphonzo Cruise, [place] Renal Navigator [phone]

## 2020-02-23 NOTE — NC FL2 (Signed)
[place] LEVEL OF CARE SCREENING TOOL     IDENTIFICATION  Patient Name: Bailey Cox Birthdate: Apr 08, 1946 Sex: female Admission Date (Current Location): 01/05/2020  [facility] and [place] Number:  Herbalist and Address:  [facility]. [facility], [place] [address] [place], [place] [address]      Provider Number: [id number]  Attending Physician Name and Address:  Mercy Riding, MD  Relative Name and Phone Number:       Current Level of Care: Hospital Recommended Level of Care: [place] Prior Approval Number:    Date Approved/Denied:   PASRR Number: [id number]  Discharge Plan: SNF    Current Diagnoses: Patient Active Problem List   Diagnosis Date Noted  . Pressure injury of skin 02/17/2020  . Palliative care by specialist   . Weakness   . Depression, recurrent ([place])   . Abdominal pain   . Goals of care, counseling/discussion   . Acute hypoxemic respiratory failure ([place])   . Acute kidney injury ([place])   . Anemia due to stage 4 chronic kidney disease ([place])   . Hypotension   . Sepsis ([place])   . Renal failure 01/06/2020  . 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 Chronic hypoxemic respiratory failure ([place]) 01/06/2020  . Shock ([place]) 01/05/2020    Orientation RESPIRATION BLADDER Height & Weight     Self(alert)  O2(3L nasal canula) Incontinent, External catheter Weight: 197 lb 12 oz (89.7 kg) Height:  '5\' 4"'$  (162.6 cm)  BEHAVIORAL SYMPTOMS/MOOD NEUROLOGICAL BOWEL NUTRITION STATUS      Incontinent Diet, Feeding tube(Osmolite 1.5 (960 ml/day) via PEG; 60 ml Prostat BID)  AMBULATORY STATUS COMMUNICATION OF NEEDS Skin   Extensive Assist Verbally Surgical wounds, Skin abrasions, Other (Comment)(peg tube LUQ; MASD on buttocks and perineum w/ foam; wound on groin w/ silver hydrofiber; wound on arm)                       Personal Care Assistance Level of Assistance  Bathing, Feeding, Dressing Bathing Assistance:  Maximum assistance Feeding assistance: Maximum assistance Dressing Assistance: Maximum assistance     Functional Limitations Info  Sight, Hearing, Speech Sight Info: Adequate Hearing Info: Adequate Speech Info: Adequate    SPECIAL CARE FACTORS FREQUENCY  PT (By licensed PT), OT (By licensed OT)     PT Frequency: 5x week OT Frequency: 5x week            Contractures Contractures Info: Not present    Additional Factors Info  Code Status, Allergies, Psychotropic, Insulin Sliding Scale Code Status Info: Full Code Allergies Info: Drug Class (Clindamycin/lincomycin), Drug Ingredient (Cephalexin), Nsaids, Penicillins, Sulfa Antibiotics, Dilaudid (Hydromorphone Hcl) Psychotropic Info: PARoxetine (PAXIL) tablet 30 mg daily per tube; traZODone (DESYREL) tablet 25 mg daily at bedtime per tube Insulin Sliding Scale Info: insulin aspart (novoLOG) injection 0-15 Units every 4 hrs       Current Medications (02/23/2020):  This is the current hospital active medication list Current Facility-Administered Medications  Medication Dose Route Frequency Provider Last Rate Last Admin  . acetaminophen (TYLENOL) 160 MG/5ML solution 1,000 mg  1,000 mg Per Tube Q8H Gonfa, Taye T, MD   1,000 mg at 02/23/20 1401  . bisacodyl (DULCOLAX) suppository 10 mg  10 mg Rectal Daily Dellinger, Marianne L, PA-C   10 mg at 02/17/20 1147  . busPIRone (BUSPAR) tablet 5 mg  5 mg Per Tube BID Cristal Ford, DO   5 mg at 02/23/20 0836  .  calcium acetate (PHOSLO) capsule 667 mg  667 mg Oral TID WC Rosita Fire, MD   667 mg at 02/23/20 1152  . camphor-menthol (SARNA) lotion   Topical QID Lavina Hamman, MD   Given at 02/21/20 1059  . chlorhexidine gluconate (MEDLINE KIT) (PERIDEX) 0.12 % solution 15 mL  15 mL Mouth Rinse BID Corey Harold, NP   15 mL at 02/23/20 0836  . Chlorhexidine Gluconate Cloth 2 % PADS 6 each  6 each Topical Q0600 Rosita Fire, MD   6 each at 02/23/20 0522  . [START ON  02/26/2020] Darbepoetin Alfa (ARANESP) injection 200 mcg  200 mcg Subcutaneous Q Thu-HD [place], Ryan B, MD      . feeding supplement (NEPRO CARB STEADY) liquid 1,000 mL  1,000 mL Per Tube Q24H Wendee Beavers T, MD 40 mL/hr at 02/22/20 1841 1,000 mL at 02/22/20 1841  . feeding supplement (NEPRO CARB STEADY) liquid 237 mL  237 mL Oral PRN Gonfa, Taye T, MD      . feeding supplement (PRO-STAT SUGAR FREE 64) liquid 30 mL  30 mL Per Tube TID Wendee Beavers T, MD   30 mL at 02/23/20 0836  . fentaNYL (SUBLIMAZE) injection 50 mcg  50 mcg Intravenous Q2H PRN Wendee Beavers T, MD      . guaiFENesin (ROBITUSSIN) 100 MG/5ML solution 200 mg  10 mL Oral Q4H PRN Dellinger, Marianne L, PA-C   200 mg at 02/21/20 1730  . guaiFENesin-codeine 100-10 MG/5ML solution 5 mL  5 mL Oral Q6H PRN Lavina Hamman, MD   5 mL at 02/22/20 2111  . haloperidol lactate (HALDOL) injection 2 mg  2 mg Intravenous Q6H PRN Lavina Hamman, MD   2 mg at 02/20/20 0850  . insulin aspart (novoLOG) injection 0-15 Units  0-15 Units Subcutaneous Q4H Corey Harold, NP   3 Units at 02/23/20 1152  . insulin aspart (novoLOG) injection 2 Units  2 Units Subcutaneous Q4H Mercy Riding, MD   2 Units at 02/23/20 1152  . insulin glargine (LANTUS) injection 8 Units  8 Units Subcutaneous BID Mercy Riding, MD   8 Units at 02/23/20 0841  . lactulose (CHRONULAC) 10 GM/15ML solution 20 g  20 g Per Tube BID Wendee Beavers T, MD   20 g at 02/23/20 0835  . metoprolol tartrate (LOPRESSOR) injection 2.5-5 mg  2.5-5 mg Intravenous Q3H PRN Rigoberto Noel, MD   5 mg at 01/17/20 1603  . ondansetron (ZOFRAN) injection 4 mg  4 mg Intravenous Q8H PRN Lang Snow, FNP   4 mg at 02/22/20 1452  . oxyCODONE (Oxy IR/ROXICODONE) immediate release tablet 5 mg  5 mg Oral Q8H PRN Cyndia Skeeters, Taye T, MD      . pantoprazole sodium (PROTONIX) 40 mg/20 mL oral suspension 40 mg  40 mg Per Tube BID Lavina Hamman, MD   40 mg at 02/23/20 0841  . PARoxetine (PAXIL) tablet 40 mg  40 mg Per Tube Daily  Dellinger, Marianne L, PA-C   40 mg at 02/23/20 0836  . phenol (CHLORASEPTIC) mouth spray 1 spray  1 spray Mouth/Throat PRN Lavina Hamman, MD      . promethazine (PHENERGAN) injection 12.5 mg  12.5 mg Intravenous Q6H PRN Lavina Hamman, MD   12.5 mg at 02/19/20 1253  . QUEtiapine (SEROQUEL) tablet 25 mg  25 mg Per Tube QHS Gonfa, Taye T, MD      . sodium chloride flush (NS) 0.9 % injection  10-40 mL  10-40 mL Intracatheter Q12H Kandis Cocking, MD   10 mL at 02/23/20 0837  . sodium chloride flush (NS) 0.9 % injection 10-40 mL  10-40 mL Intracatheter PRN Kandis Cocking, MD   20 mL at 02/11/20 1406  . traZODone (DESYREL) tablet 25 mg  25 mg Per Tube QHS Margaretha Seeds, MD   25 mg at 02/22/20 2111     Discharge Medications: Please see discharge summary for a list of discharge medications.  Relevant Imaging Results:  Relevant Lab Results:   Additional Information SS# [place], [place]

## 2020-02-23 NOTE — TOC Progression Note (Signed)
Transition of Care [facility]) - Progression Note    Patient Details  Name: Bailey Cox MRN: [id number] Date of Birth: 1946/02/18  Transition of Care [facility]) CM/SW [place], [place] Phone Number: 02/23/2020, 4:45 PM  Clinical Narrative:    CSW received call from renal navigator Colleen, LCSW, pt tolerated treatment in chair w/ no sedatives. Pt will need to continue to tolerate for further sessions for outpatient clipping. Jaclyn Shaggy has discussed this with [name] who plans to be at the hospital for treatment tomorrow. Jaclyn Shaggy states that family has interest in [facility] for SNF placement. CSW left message with admissions director. Await call back to see if they can accept dialysis pt.   Jaclyn Shaggy also had mentioned to pt family (as has previously been discussed w/ Autumn) that pt transport from hospital to SNF would be private pay. Pt daughter [name] inquiring as to why that is so- CSW will f/u with leadership and see if there is anything our department can further assist with in this regard. CSW to updated FL2 and continue to follow.    Expected Discharge Plan: Skilled Nursing Facility Barriers to Discharge: Continued Medical Work up, Other (comment)(ability to tolerate dialysis in recliner)  Expected Discharge Plan and Services Expected Discharge Plan: [place] In-house Referral: Clinical Social Work Discharge Planning Services: CM Consult Post Acute Care Choice: [place] Living arrangements for the past 2 months: (Has been hospitalized for over 4 months)  Readmission Risk Interventions Readmission Risk Prevention Plan 01/28/2020  Transportation Screening Complete  PCP or Specialist Appt within 3-5 Days Not Complete  Not Complete comments plan for SNF  HRI or [place] Not Complete  HRI or Home Care Consult comments plan for SNF  Social Work Consult for [place] Planning/Counseling Complete  Palliative Care  Screening Complete  Medication Review Press photographer) Referral to Pharmacy  PCP or Specialist appointment within 3-5 days of discharge Not Complete  PCP/Specialist Appt Not Complete comments plan for SNF  HRI or Home Care Consult Complete  SW Recovery Care/Counseling Consult Complete  Palliative Care Screening Not Complete  Comments appropriate at this time  [place] Complete

## 2020-02-23 NOTE — Progress Notes (Signed)
Admit: 01/05/2020 LOS: 70  41F prolonged AKI now ESRD  Subjective:  . HD yesterday, full time, in recliner, did well . 0.5L uop . General malaise today  04/18 0701 - 04/19 0700 In: 0  Out: 662 [Urine:150; Stool:1]  Filed Weights   02/19/20 1415 02/19/20 1750 02/23/20 0500  Weight: 85.1 kg 84.2 kg 89.7 kg    Scheduled Meds: . acetaminophen (TYLENOL) oral liquid 160 mg/5 mL  1,000 mg Per Tube Q8H  . bisacodyl  10 mg Rectal Daily  . busPIRone  5 mg Per Tube BID  . calcium acetate  667 mg Oral TID WC  . camphor-menthol   Topical QID  . chlorhexidine gluconate (MEDLINE KIT)  15 mL Mouth Rinse BID  . Chlorhexidine Gluconate Cloth  6 each Topical Q0600  . [START ON 02/26/2020] darbepoetin (ARANESP) injection - DIALYSIS  200 mcg Subcutaneous Q Thu-HD  . feeding supplement (NEPRO CARB STEADY)  1,000 mL Per Tube Q24H  . feeding supplement (PRO-STAT SUGAR FREE 64)  30 mL Per Tube TID  . insulin aspart  0-15 Units Subcutaneous Q4H  . insulin aspart  2 Units Subcutaneous Q4H  . insulin glargine  8 Units Subcutaneous BID  . lactulose  20 g Per Tube BID  . pantoprazole sodium  40 mg Per Tube BID  . PARoxetine  40 mg Per Tube Daily  . QUEtiapine  25 mg Per Tube QHS  . sodium chloride flush  10-40 mL Intracatheter Q12H  . traZODone  25 mg Per Tube QHS   Continuous Infusions:  PRN Meds:.feeding supplement (NEPRO CARB STEADY), fentaNYL (SUBLIMAZE) injection, guaiFENesin, guaiFENesin-codeine, haloperidol lactate, metoprolol tartrate, ondansetron (ZOFRAN) IV, oxyCODONE, phenol, promethazine, sodium chloride flush  Current Labs: reviewed    Physical Exam:  Blood pressure (!) 126/51, pulse 93, temperature 99 F (37.2 C), temperature source Axillary, resp. rate 20, height _0  (1.626 m), weight 89.7 kg, SpO2 90 %. NAD Regular, nl s1s2 CTAB No sig LEE Trach site bandaged R IJ TDC  A 1. Dialysis dep't AKI now ESRD on THS schedule  2. Debility; full scope of care currenty; palliatove  working with patient/family 3. Difficulting with tolerating HD, now using recliner successfully 4. Anemia of CKD; ESA qThur 1. TSAT 5% 4/18, start Fe 5. Hx/o PEA arrest 6. Hx/o trach, decannulated 7. Lacks capacity for medical decision making   P . Cont HD rolloever, on THS schedule:  3.5h/ 400/800, 2K 1-2L UF, TDC, no heparin . HD is not improving her quality of life; plan to continue for now after several [place] conversations . Hb downtrendig, inc ESA dosing; needs IV Fe start with HD 4/20 . Cont to encourage ongoing HD in recliner . Daily weights, Daily Renal Panel, Strict I/Os, Avoid nephrotoxins (NSAIDs, judicious IV Contrast)  . Medication Issues; o Preferred narcotic agents for pain control are hydromorphone, fentanyl, and methadone. Morphine should not be used.  o Baclofen should be avoided o Avoid oral sodium phosphate and magnesium citrate based laxatives / bowel preps    Pearson Grippe MD 02/23/2020, 11:57 AM  Recent Labs  Lab 02/21/20 1152 02/22/20 0029 02/23/20 0318  NA 133* 132* 135  K 4.3 4.6 4.2  CL 93* 94* 96*  CO2 _1 GLUCOSE 228* 200* 108*  BUN 71* 87* 51*  CREATININE 2.19* 2.47* 1.67*  CALCIUM 9.0 9.1 9.3  PHOS 4.1 4.2 3.2   Recent Labs  Lab 02/20/20 0318 02/21/20 1152 02/22/20 0029  WBC 8.6 7.4 6.5  HGB 7.4*  7.4* 7.6*  HCT 25.0* 26.4* 27.2*  MCV 94.7 96.7 96.8  PLT PLATELET CLUMPS NOTED ON SMEAR, UNABLE TO ESTIMATE 105* 106*

## 2020-02-24 DIAGNOSIS — T68XXXA Hypothermia, initial encounter: Secondary | ICD-10-CM

## 2020-02-24 DIAGNOSIS — J9601 Acute respiratory failure with hypoxia: Secondary | ICD-10-CM | POA: Diagnosis not present

## 2020-02-24 DIAGNOSIS — N184 Chronic kidney disease, stage 4 (severe): Secondary | ICD-10-CM | POA: Diagnosis not present

## 2020-02-24 DIAGNOSIS — J9611 Chronic respiratory failure with hypoxia: Secondary | ICD-10-CM | POA: Diagnosis not present

## 2020-02-24 DIAGNOSIS — N179 Acute kidney failure, unspecified: Secondary | ICD-10-CM | POA: Diagnosis not present

## 2020-02-24 LAB — GLUCOSE, CAPILLARY
Glucose-Capillary: 167 mg/dL — ABNORMAL HIGH (ref 70–99)
Glucose-Capillary: 174 mg/dL — ABNORMAL HIGH (ref 70–99)
Glucose-Capillary: 164 mg/dL — ABNORMAL HIGH (ref 70–99)
Glucose-Capillary: 143 mg/dL — ABNORMAL HIGH (ref 70–99)
Glucose-Capillary: 146 mg/dL — ABNORMAL HIGH (ref 70–99)
Glucose-Capillary: 150 mg/dL — ABNORMAL HIGH (ref 70–99)
Glucose-Capillary: 155 mg/dL — ABNORMAL HIGH (ref 70–99)
Glucose-Capillary: 144 mg/dL — ABNORMAL HIGH (ref 70–99)

## 2020-02-24 LAB — CBC
HCT: 29.1 % — ABNORMAL LOW (ref 36.0–46.0)
Hemoglobin: 8 g/dL — ABNORMAL LOW (ref 12.0–15.0)
MCH: 26.8 pg (ref 26.0–34.0)
MCHC: 27.5 g/dL — ABNORMAL LOW (ref 30.0–36.0)
MCV: 97.3 fL (ref 80.0–100.0)
Platelets: 120 10*3/uL — ABNORMAL LOW (ref 150–400)
RBC: 2.99 MIL/uL — ABNORMAL LOW (ref 3.87–5.11)
RDW: 17.4 % — ABNORMAL HIGH (ref 11.5–15.5)
WBC: 7.4 10*3/uL (ref 4.0–10.5)
nRBC: 0.4 % — ABNORMAL HIGH (ref 0.0–0.2)

## 2020-02-24 LAB — LACTIC ACID, PLASMA
Lactic Acid, Venous: 0.8 mmol/L (ref 0.5–1.9)
Lactic Acid, Venous: 0.7 mmol/L (ref 0.5–1.9)

## 2020-02-24 LAB — TSH: TSH: 4.345 u[IU]/mL (ref 0.350–4.500)

## 2020-02-24 LAB — AMMONIA: Ammonia: 44 umol/L — ABNORMAL HIGH (ref 9–35)

## 2020-02-24 MED ORDER — FENTANYL CITRATE (PF) 100 MCG/2ML IJ SOLN: 25.0000 ug | INTRAMUSCULAR | Status: DC | PRN

## 2020-02-24 MED ORDER — DARBEPOETIN ALFA 200 MCG/0.4ML IJ SOSY
200.0000 ug | PREFILLED_SYRINGE | INTRAMUSCULAR | Status: DC
  Administered 2020-02-25: 200 ug via SUBCUTANEOUS
  Filled 2020-02-24 (×2): qty 0.4

## 2020-02-24 MED ORDER — SODIUM CHLORIDE 0.9 % IV SOLN
125.0000 mg | INTRAVENOUS | Status: AC
  Administered 2020-02-25 – 2020-03-12 (×8): 125 mg via INTRAVENOUS
  Filled 2020-02-24 (×11): qty 10

## 2020-02-24 NOTE — Progress Notes (Signed)
Admit: 01/05/2020 LOS: 65  45F prolonged AKI now ESRD  Subjective:  . Lethargic this AM . Family arriving this evening to visit  04/19 0701 - 04/20 0700 In: -  Out: 200 [Urine:200]  Filed Weights   02/23/20 0500 02/24/20 0415  Weight: 89.7 kg 90.4 kg    Scheduled Meds: . acetaminophen (TYLENOL) oral liquid 160 mg/5 mL  1,000 mg Per Tube Q8H  . bisacodyl  10 mg Rectal Daily  . busPIRone  5 mg Per Tube BID  . calcium acetate  667 mg Oral TID WC  . camphor-menthol   Topical QID  . chlorhexidine gluconate (MEDLINE KIT)  15 mL Mouth Rinse BID  . Chlorhexidine Gluconate Cloth  6 each Topical Q0600  . [START ON 02/26/2020] darbepoetin (ARANESP) injection - DIALYSIS  200 mcg Subcutaneous Q Thu-HD  . feeding supplement (NEPRO CARB STEADY)  1,000 mL Per Tube Q24H  . feeding supplement (PRO-STAT SUGAR FREE 64)  30 mL Per Tube TID  . insulin aspart  0-15 Units Subcutaneous Q4H  . insulin aspart  2 Units Subcutaneous Q4H  . insulin glargine  8 Units Subcutaneous BID  . lactulose  20 g Per Tube BID  . pantoprazole sodium  40 mg Per Tube BID  . PARoxetine  40 mg Per Tube Daily  . QUEtiapine  25 mg Per Tube QHS  . sodium chloride flush  10-40 mL Intracatheter Q12H  . traZODone  25 mg Per Tube QHS   Continuous Infusions:  PRN Meds:.feeding supplement (NEPRO CARB STEADY), fentaNYL (SUBLIMAZE) injection, guaiFENesin, guaiFENesin-codeine, haloperidol lactate, metoprolol tartrate, ondansetron (ZOFRAN) IV, oxyCODONE, phenol, promethazine, sodium chloride flush  Current Labs: reviewed    Physical Exam:  Blood pressure (!) 122/52, pulse 79, temperature (!) 96 F (35.6 C), temperature source Axillary, resp. rate 18, height '5\' 4"'$  (1.626 m), weight 90.4 kg, SpO2 98 %. NAD Regular, nl s1s2 CTAB No sig LEE Trach site bandaged R IJ TDC  A 1. Dialysis dep't AKI now ESRD on THS schedule  2. Debility; full scope of care currenty; palliatove working with patient/family 3. Difficulting with  tolerating HD, now using recliner successfully 4. Anemia of CKD; ESA qThur 1. TSAT 5% 4/18, start Fe 5. Hx/o PEA arrest 6. Hx/o trach, decannulated 7. Lacks capacity for medical decision making   P . HD tomorrow, Wednesday to permit family to join her:  3.5h/ 400/800, 2K 1-2L UF, TDC, no heparin . HD is not improving her quality of life; plan to continue for now after several [place] conversations  . Hb downtrendig, inc ESA dosing; needs IV Fe start with HD 4/21 . Cont to encourage ongoing HD in recliner . Daily weights, Daily Renal Panel, Strict I/Os, Avoid nephrotoxins (NSAIDs, judicious IV Contrast)  . Medication Issues; o Preferred narcotic agents for pain control are hydromorphone, fentanyl, and methadone. Morphine should not be used.  o Baclofen should be avoided o Avoid oral sodium phosphate and magnesium citrate based laxatives / bowel preps    Pearson Grippe MD 02/24/2020, 11:12 AM  Recent Labs  Lab 02/21/20 1152 02/22/20 0029 02/23/20 0318  NA 133* 132* 135  K 4.3 4.6 4.2  CL 93* 94* 96*  CO2 '28 25 27  '$ GLUCOSE 228* 200* 108*  BUN 71* 87* 51*  CREATININE 2.19* 2.47* 1.67*  CALCIUM 9.0 9.1 9.3  PHOS 4.1 4.2 3.2   Recent Labs  Lab 02/21/20 1152 02/22/20 0029 02/23/20 2334  WBC 7.4 6.5 7.4  HGB 7.4* 7.6* 8.0*  HCT 26.4*  27.2* 29.1*  MCV 96.7 96.8 97.3  PLT 105* 106* 120*

## 2020-02-24 NOTE — Progress Notes (Signed)
Nutrition Follow-up  DOCUMENTATION CODES:   Morbid obesity  INTERVENTION:   Currently on renal formula, may consider d/c phosphorus binder.   Continue tube feeding:  Nepro@ 59m/hr via PEG 332mProstat TID  Tube feeding regimen provides2028kcal (100% of needs),123grams of protein, and 69850mf H2O. Consider decreasing tube feeding rate as PO intake increases.   NUTRITION DIAGNOSIS:   Inadequate oral intake related to inability to eat as evidenced by NPO status.  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 Meal completions charted 0-100% (30% average). Drinking Nepro shakes off/on. More lethargic today, unable to engage. Tolerating tube feeding. Continue to meet 100% until PO progresses and remains consistent. Noted no BM x 4 days. Regimen in place.   Admission weight: 113 kg  Current weight: 90.4 kg   I/O: -5,602 ml since 4/6 Last HD 4/18: 511 ml net  UF  Medications: dulcolax, phoslo, aranesp, SS novolog, lantus, lactulose Labs: CBG 114-185  Diet Order:   Diet Order            DIET DYS 2 Room service appropriate? Yes with Assist; Fluid consistency: Thin  Diet effective now              EDUCATION NEEDS:   No education  needs have been identified at this time  Skin:  Skin Assessment: Reviewed RN Assessment(L groin: open wound/MASD)  Non pressure- L arm Stage I- coccyx  Last BM:  4/16  Height:   Ht Readings from Last 1 Encounters:  01/05/20 '5\' 4"'$  (1.626 m)    Weight:   Wt Readings from Last 1 Encounters:  02/24/20 90.4 kg    Ideal Body Weight:  54.5 kg  BMI:  Body mass index is 34.21 kg/m.  Estimated Nutritional Needs:   Kcal:  1900-2100 kcal  Protein:  110-125 grams  Fluid:  >/= 1.5 L/day   CarMariana Single, LDN Clinical Nutrition Pager listed in [facility]

## 2020-02-24 NOTE — Progress Notes (Signed)
Patient's oral temp 97.6 and states she's hot. Bear hugger turned off. Will monitor temperature.   1553: oral temp 98.8. Patient comfortable.

## 2020-02-24 NOTE — Progress Notes (Addendum)
Patient's temp now 95.7 after bear hugger applied. Patient has not voided today. Bladder scan done. 40 ml urine in bladder per scan. MD aware  K. Clement Husbands, RN

## 2020-02-24 NOTE — Progress Notes (Addendum)
Patient's temp this am was 96 axillary. Now rectal temp is 94.7. Patient states she is cold. Patient has been very sleepy this morning but opens eyes with voice. MD paged via [place]. New orders received.

## 2020-02-24 NOTE — Progress Notes (Signed)
PROGRESS NOTE  Lanett Lasorsa PJK:[id number] DOB: 19-Aug-1946   PCP: Patient, No Pcp Per  Patient is from: Home  DOA: 01/05/2020 LOS: 52  Brief Narrative / Interim history: 74 year old female with history of cholecystitis status post cholecystectomy in 9/20 at [facility].  She was discharged to SNF but rehospitalized to [facility] and had bile duct stent.  Discharged back to SNF but readmitted with Ogilive syndrome.  Hospital course complicated by fluid overload and transferred to [facility] for dialysis.  At [facility], developed respiratory failure requiring vent support.  She underwent trach.she was eventually discharged to [facility] on 3/1 after prolonged hospitalization at [facility] but deemed too sick, and sent to [facility] due to fluid overload and acidosis.  Patient was initially admitted to ICU.  Required CRRT 3/9-3/12.  Transfer to floor on 3/14.  She pulled her trach.  Now on oxygen by nasal cannula.  She is on IHD via [place].  Has refused dialysis at times but deemed to lack capacity.  Family wants full scope of care including full code and dialysis.  She has been noncompliant with care and hemodialysis but lacks capacity. Ethics committee meeting held on 4/6.  The plan is to continue full scope of care per family's wish.  Finally she tolerated HD in recliner chair on 4/19 without sedating medications.  She will be discharged to SNF once she had outpatient HD spot and continues to tolerate HD on recliner chair.  Subjective: Seen and examined earlier this morning.  Rectal temp of 94.7 this morning.  She is somewhat sleepier.  Responds "not too good" when asked how she is feeling.  She could not be specific.  Responds "no.  I do not know it" when asked her name.  Difficult to obtain history.  Barely follows commands.  Does not appear to be in distress.  Objective: Vitals:   02/24/20 1119 02/24/20 1155 02/24/20 1200 02/24/20 1247  BP:  130/63 (!) 137/55   Pulse:  80 80 83  Resp:  17 20 (!)  22  Temp: (!) 94.7 F (34.8 C)   (!) 95.7 F (35.4 C)  TempSrc: Rectal     SpO2:  100% 100% 95%  Weight:      Height:        Intake/Output Summary (Last 24 hours) at 02/24/2020 1314 Last data filed at 02/23/2020 1613 Gross per 24 hour  Intake -  Output 200 ml  Net -200 ml   Filed Weights   02/23/20 0500 02/24/20 0415  Weight: 89.7 kg 90.4 kg    Examination:  GENERAL: Chronically ill-appearing.  No apparent distress. HEENT: MMM.  Vision and hearing grossly intact.  NECK: Dressing over previous tracheostomy site DCI. RESP: 100% on 3 L.  No IWOB.  Fair aeration bilaterally. CVS:  RRR. Heart sounds normal.  ABD/GI/GU: Bowel sounds present. Soft.  Mild diffuse tenderness.  PEG tube. MSK/EXT:  Moves extremities. No apparent deformity. Trace edema SKIN: no apparent skin lesion or wound NEURO: Sleepy but wakes to voice.  Responds to some question.  Barely follows command.  PERRL.  Patellar reflex symmetric. PSYCH: Sleepy.  No distress or agitation.  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  Had HD in recliner chair  for the first time yesterday. -Family to stay at bedside during HD for compliance.  Also using as needed Haldol for HD. -Nephrology managing  MRSA/Pseudomonas aeruginosa pneumonia-previously completed antibiotic course for this. Aspiration pneumonia 02/16/2020-respiratory failure improving. -CXR on 4/14 with RML, RLL and Left mid lung opacities concerning for multifocal pneumonia -Completed 5 days of CTX and azithromycin on 4/16. -Aspiration precautions.  Acute on chronic hypoxic  respiratory failure-likely due to aspiration pneumonia and fluid overload.  Saturating in mid 90s on 3 L by [place].  No respiratory distress.  Decannulated on 3/19. -Wean oxygen as able.  Minimal oxygen to keep saturation above 88%.  -Continue encouraging CPT.  Acute metabolic encephalopathy-seems worse today.  Sleepy but wakes to voice.  Multiple potential etiologies including azotemia, iatrogenic, delirium.. No focal neuro deficits.  No obvious infectious signs.  Just completed CTX and azithromycin for aspiration pneumonia.  Ammonia slightly elevated.  TSH, B12 and RPR within normal.  Now with hypothermia unknown etiology.  -Continue lactulose 20 g twice daily -Reduced Seroquel from 25 mg twice daily to nightly on 4/18. -Discontinue oxycodone and trazodone -Reduce fentanyl -Frequent orientation and delirium precautions. -Hypothermia work-up as below.  Hypothermia: Unclear etiology.  No fever or leukocytosis to suggest infectious process.  Stable from respiratory standpoint.  Hemodynamically stable. -Bear Hugger -Check TSH, urinalysis, urine culture, blood culture and lactic acid -No antibiotics at this time.  DM-2 with hyperglycemia: A1c 5.7%. Recent Labs    02/24/20 0442 02/24/20 0812 02/24/20 1231  GLUCAP 164* 143* 146*  -Continue SSI-moderate every 4 hours -Continue NovoLog 2 units every 4 hours -Continue Lantus 8 units twice daily.  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  Iron sat 5%. -ESA and IV iron per nephrology  Hypokalemia: Resolved. -Continue monitoring  Hyponatremia: Resolved.  Anxiety/depression/agitation -Continue Paxil and BuSpar.  Discontinue trazodone. -Frequent orientation and delirium precautions -As needed Haldol and scheduled Seroquel.   GERD/constipation -Continue PPI and bowel regimen  Mild thrombocytopenia: Stable. -Continue  monitoring  Generalized weakness/debility -PT/OT-not able to participate in therapy.  Requiring mechanical lift to chair.  Chronic pain: -Scheduled Tylenol 1000 mg 3 times daily -Discontinued oxycodone and reduced IV fentanyl due to AMS.  Goals of care/Ethical issues-complex medical issue with guarded prognosis. Patient refused HD previously but deemed to lack capacity by psych. Daughter wishes her to be full code with full scope of care including dialysis.  Continued on HD with the help of medications and family. -Palliative care following -Ethics involved previously.  Nutritional support/dysphagia -Appreciate input by SLP-recommended dysphagia 2 diet. -Appreciate input by dietitian -Continue TF and dysphagia 2 diet Nutrition Problem: Inadequate oral intake Etiology: inability to eat  Signs/Symptoms: NPO status  Interventions: Tube feeding   Pressure Injury 02/16/20 Coccyx Stage 1 -  Intact skin with non-blanchable redness of a localized area usually over a bony prominence. (Active)  02/16/20 1700  Location: Coccyx  Location Orientation:   Staging: Stage 1 -  Intact skin with non-blanchable redness of a localized area usually over a bony prominence.  Wound Description (Comments):   Present on Admission:      DVT prophylaxis: Start subcu heparin Code Status: Full code Family Communication: None at bedside.  Updated patient's daughter over the phone.   Discharge barrier: Hypothermia evaluation, encephalopathy and outpatient HD spot Patient is from: [facility] Final disposition: Likely SNF after hypothermia evaluation, improvement in encephalopathy, outpatient HD and clearance by  nephrology-probably in the next 4 to 5 days  Consultants:  PCCM (off) Nephrology PMT   Sch Meds:  Scheduled Meds: . acetaminophen (TYLENOL) oral liquid 160 mg/5 mL  1,000 mg Per Tube Q8H  . bisacodyl  10 mg Rectal Daily  . busPIRone  5 mg Per Tube BID  . calcium acetate  667 mg Oral  TID WC  . camphor-menthol   Topical QID  . chlorhexidine gluconate (MEDLINE KIT)  15 mL Mouth Rinse BID  . Chlorhexidine Gluconate Cloth  6 each Topical Q0600  . [START ON 02/25/2020] darbepoetin (ARANESP) injection - DIALYSIS  200 mcg Subcutaneous Q Wed-HD  . feeding supplement (NEPRO CARB STEADY)  1,000 mL Per Tube Q24H  . feeding supplement (PRO-STAT SUGAR FREE 64)  30 mL Per Tube TID  . insulin aspart  0-15 Units Subcutaneous Q4H  . insulin aspart  2 Units Subcutaneous Q4H  . insulin glargine  8 Units Subcutaneous BID  . lactulose  20 g Per Tube BID  . pantoprazole sodium  40 mg Per Tube BID  . PARoxetine  40 mg Per Tube Daily  . QUEtiapine  25 mg Per Tube QHS  . sodium chloride flush  10-40 mL Intracatheter Q12H   Continuous Infusions: . [START ON 02/25/2020] ferric gluconate (FERRLECIT/NULECIT) IV     PRN Meds:.feeding supplement (NEPRO CARB STEADY), fentaNYL (SUBLIMAZE) injection, guaiFENesin, haloperidol lactate, metoprolol tartrate, ondansetron (ZOFRAN) IV, phenol, promethazine, sodium chloride flush  Antimicrobials: Anti-infectives (From admission, onward)   Start     Dose/Rate Route Frequency Ordered Stop   02/16/20 1230  cefTRIAXone (ROCEPHIN) 2 g in sodium chloride 0.9 % 100 mL IVPB     2 g 200 mL/hr over 30 Minutes Intravenous Every 24 hours 02/16/20 1127 02/20/20 0941   02/16/20 1230  azithromycin (ZITHROMAX) 500 mg in sodium chloride 0.9 % 250 mL IVPB     500 mg 250 mL/hr over 60 Minutes Intravenous Every 24 hours 02/16/20 1127 02/20/20 1140   01/10/20 0930  vancomycin (VANCOCIN) IVPB 1000 mg/200 mL premix  Status:  Discontinued     1,000 mg 200 mL/hr over 60 Minutes Intravenous  Once 01/10/20 0915 01/13/20 0623   01/06/20 2200  ceFEPIme (MAXIPIME) 2 g in sodium chloride 0.9 % 100 mL IVPB     2 g 200 mL/hr over 30 Minutes Intravenous Every 24 hours 01/05/20 2251 01/11/20 2234   01/05/20 2200  ceFEPIme (MAXIPIME) 2 g in sodium chloride 0.9 % 100 mL IVPB     2 g 200  mL/hr over 30 Minutes Intravenous  Once 01/05/20 2148 01/06/20 0015   01/05/20 2200  metroNIDAZOLE (FLAGYL) IVPB 500 mg     500 mg 100 mL/hr over 60 Minutes Intravenous  Once 01/05/20 2148 01/06/20 0015   01/05/20 2200  vancomycin (VANCOCIN) IVPB 1000 mg/200 mL premix  Status:  Discontinued     1,000 mg 200 mL/hr over 60 Minutes Intravenous  Once 01/05/20 2148 01/05/20 2151   01/05/20 2200  vancomycin (VANCOREADY) IVPB 2000 mg/400 mL  Status:  Discontinued     2,000 mg 200 mL/hr over 120 Minutes Intravenous  Once 01/05/20 2151 01/05/20 2223       I have personally reviewed the following labs and images: CBC: Recent Labs  Lab 02/19/20 0413 02/20/20 0318 02/21/20 1152 02/22/20 0029 02/23/20 2334  WBC 13.8* 8.6 7.4 6.5 7.4  HGB 7.8* 7.4* 7.4* 7.6* 8.0*  HCT 26.9* 25.0* 26.4* 27.2* 29.1*  MCV 95.1 94.7 96.7 96.8 97.3  PLT 130*  PLATELET CLUMPS NOTED ON SMEAR, UNABLE TO ESTIMATE 105* 106* 120*   BMP &GFR Recent Labs  Lab 02/19/20 0413 02/20/20 0318 02/21/20 1152 02/22/20 0029 02/23/20 0318  NA 133* 135 133* 132* 135  K 4.0 3.6 4.3 4.6 4.2  CL 95* 95* 93* 94* 96*  CO2 _0 GLUCOSE 125* 214* 228* 200* 108*  BUN 58* 36* 71* 87* 51*  CREATININE 2.06* 1.48* 2.19* 2.47* 1.67*  CALCIUM 8.6* 8.7* 9.0 9.1 9.3  MG 2.2 1.9 2.2 2.3 2.0  PHOS 2.3* 2.1* 4.1 4.2 3.2   Estimated Creatinine Clearance: 33.7 mL/min (A) (by C-G formula based on SCr of 1.67 mg/dL (H)). Liver & Pancreas: Recent Labs  Lab 02/19/20 0413 02/20/20 0318 02/21/20 1152 02/22/20 0029 02/23/20 0318  AST 20  --   --   --   --   ALT 17  --   --   --   --   ALKPHOS 112  --   --   --   --   BILITOT 0.5  --   --   --   --   PROT 6.1*  --   --   --   --   ALBUMIN 2.4*  2.5* 2.4* 2.1* 2.3* 2.5*   No results for input(s): LIPASE, AMYLASE in the last 168 hours. Recent Labs  Lab 02/22/20 1129 02/23/20 2334  AMMONIA 52* 44*   Diabetic: No results for input(s): HGBA1C in the last 72 hours. Recent  Labs  Lab 02/23/20 2044 02/23/20 2342 02/24/20 0442 02/24/20 0812 02/24/20 1231  GLUCAP 167* 174* 164* 143* 146*   Cardiac Enzymes: No results for input(s): CKTOTAL, CKMB, CKMBINDEX, TROPONINI in the last 168 hours. No results for input(s): PROBNP in the last 8760 hours. Coagulation Profile: No results for input(s): INR, PROTIME in the last 168 hours. Thyroid Function Tests: Recent Labs    02/22/20 1129  TSH 2.498   Lipid Profile: No results for input(s): CHOL, HDL, LDLCALC, TRIG, CHOLHDL, LDLDIRECT in the last 72 hours. Anemia Panel: Recent Labs    02/22/20 1129 02/22/20 1349  VITAMINB12 1,158*  --   TIBC  --  245*  IRON  --  13*   Urine analysis:    Component Value Date/Time   COLORURINE AMBER (A) 01/13/2020 1302   APPEARANCEUR TURBID (A) 01/13/2020 1302   LABSPEC 1.018 01/13/2020 1302   PHURINE 5.0 01/13/2020 1302   GLUCOSEU NEGATIVE 01/13/2020 1302   HGBUR SMALL (A) 01/13/2020 1302   BILIRUBINUR NEGATIVE 01/13/2020 1302   KETONESUR NEGATIVE 01/13/2020 1302   PROTEINUR >=300 (A) 01/13/2020 1302   NITRITE NEGATIVE 01/13/2020 1302   LEUKOCYTESUR LARGE (A) 01/13/2020 1302   Sepsis Labs: Invalid input(s): PROCALCITONIN, [place]  Microbiology: Recent Results (from the past 240 hour(s))  Culture, blood (routine x 2) Call MD if unable to obtain prior to antibiotics being given     Status: None   Collection Time: 02/16/20 12:07 PM   Specimen: BLOOD LEFT HAND  Result Value Ref Range Status   Specimen Description BLOOD LEFT HAND  Final   Special Requests   Final    BOTTLES DRAWN AEROBIC AND ANAEROBIC Blood Culture results may not be optimal due to an inadequate volume of blood received in culture bottles   Culture   Final    NO GROWTH 5 DAYS Performed at [facility], [place] [address], [place], [place] [address]    Report Status 02/21/2020 FINAL  Final  Culture, blood (routine  x 2) Call MD if unable to obtain prior to antibiotics being given     Status:  None   Collection Time: 02/16/20 12:16 PM   Specimen: BLOOD LEFT HAND  Result Value Ref Range Status   Specimen Description BLOOD LEFT HAND  Final   Special Requests   Final    BOTTLES DRAWN AEROBIC AND ANAEROBIC Blood Culture adequate volume   Culture   Final    NO GROWTH 5 DAYS Performed at [facility], [address] [place], [place] [address]    Report Status 02/21/2020 FINAL  Final    Radiology Studies: No results found.    Taye T. Octa  If 7PM-7AM, please contact night-coverage [email or web address] Password [email or web address] 02/24/2020, 1:14 PM

## 2020-02-24 NOTE — Progress Notes (Signed)
Renal Navigator spoke with Nephrologist/Dr. Joelyn Oms regarding coordinating patient's next HD treatment for when her daughter will be here with her. He states he has ordered next HD for tomorrow, first shift. Per HD unit Manager/V. Grandville Silos, patient can be called as early as 6:30am for first shift. Navigator spoke with patient's daughter, who is in town now visiting her mother to update. She states she will be at the hospital tomorrow at 6:30am in preparation to be here with her mom's HD treatment.  Navigator will follow up with patient and daughter in HD unit around 8:30am tomorrow.  Alphonzo Cruise, [place] Renal Navigator [phone]

## 2020-02-25 ENCOUNTER — Inpatient Hospital Stay (HOSPITAL_COMMUNITY): Payer: Medicare Other

## 2020-02-25 DIAGNOSIS — G9341 Metabolic encephalopathy: Secondary | ICD-10-CM | POA: Diagnosis not present

## 2020-02-25 LAB — GLUCOSE, CAPILLARY
Glucose-Capillary: 183 mg/dL — ABNORMAL HIGH (ref 70–99)
Glucose-Capillary: 169 mg/dL — ABNORMAL HIGH (ref 70–99)
Glucose-Capillary: 112 mg/dL — ABNORMAL HIGH (ref 70–99)
Glucose-Capillary: 155 mg/dL — ABNORMAL HIGH (ref 70–99)
Glucose-Capillary: 108 mg/dL — ABNORMAL HIGH (ref 70–99)
Glucose-Capillary: 126 mg/dL — ABNORMAL HIGH (ref 70–99)

## 2020-02-25 LAB — URINALYSIS, ROUTINE W REFLEX MICROSCOPIC
Bilirubin Urine: NEGATIVE
Glucose, UA: NEGATIVE mg/dL
Ketones, ur: NEGATIVE mg/dL
Nitrite: NEGATIVE
Protein, ur: 100 mg/dL — AB
Specific Gravity, Urine: 1.021 (ref 1.005–1.030)
WBC, UA: >50 WBC/hpf — ABNORMAL HIGH (ref 0–5)
pH: 5 (ref 5.0–8.0)

## 2020-02-25 LAB — BLOOD GAS, VENOUS
Acid-Base Excess: 3.3 mmol/L — ABNORMAL HIGH (ref 0.0–2.0)
Bicarbonate: 29.9 mmol/L — ABNORMAL HIGH (ref 20.0–28.0)
Drawn by: 4973
FIO2: 32
O2 Saturation: 92.5 %
Patient temperature: 36.8
pCO2, Ven: 68.9 mmHg — ABNORMAL HIGH (ref 44.0–60.0)
pH, Ven: 7.258 (ref 7.250–7.430)
pO2, Ven: 58.3 mmHg — ABNORMAL HIGH (ref 32.0–45.0)

## 2020-02-25 LAB — COMPREHENSIVE METABOLIC PANEL
ALT: 17 U/L (ref 0–44)
AST: 24 U/L (ref 15–41)
Albumin: 3.6 g/dL (ref 3.5–5.0)
Alkaline Phosphatase: 54 U/L (ref 38–126)
Anion gap: 9 (ref 5–15)
BUN: 25 mg/dL — ABNORMAL HIGH (ref 8–23)
CO2: 26 mmol/L (ref 22–32)
Calcium: 8.6 mg/dL — ABNORMAL LOW (ref 8.9–10.3)
Chloride: 104 mmol/L (ref 98–111)
Creatinine, Ser: 1.3 mg/dL — ABNORMAL HIGH (ref 0.44–1.00)
GFR calc Af Amer: 48 mL/min — ABNORMAL LOW (ref >60)
GFR calc non Af Amer: 41 mL/min — ABNORMAL LOW (ref >60)
Glucose, Bld: 93 mg/dL (ref 70–99)
Potassium: 3.7 mmol/L (ref 3.5–5.1)
Sodium: 139 mmol/L (ref 135–145)
Total Bilirubin: 1.7 mg/dL — ABNORMAL HIGH (ref 0.3–1.2)
Total Protein: 7.1 g/dL (ref 6.5–8.1)

## 2020-02-25 LAB — PHOSPHORUS: Phosphorus: 3 mg/dL (ref 2.5–4.6)

## 2020-02-25 LAB — CBC
HCT: 26.9 % — ABNORMAL LOW (ref 36.0–46.0)
Hemoglobin: 7.5 g/dL — ABNORMAL LOW (ref 12.0–15.0)
MCH: 27.3 pg (ref 26.0–34.0)
MCHC: 27.9 g/dL — ABNORMAL LOW (ref 30.0–36.0)
MCV: 97.8 fL (ref 80.0–100.0)
Platelets: 138 10*3/uL — ABNORMAL LOW (ref 150–400)
RBC: 2.75 MIL/uL — ABNORMAL LOW (ref 3.87–5.11)
RDW: 17.3 % — ABNORMAL HIGH (ref 11.5–15.5)
WBC: 8.2 10*3/uL (ref 4.0–10.5)
nRBC: 0.7 % — ABNORMAL HIGH (ref 0.0–0.2)

## 2020-02-25 LAB — HEPATITIS B CORE ANTIBODY, TOTAL: Hep B Core Total Ab: NONREACTIVE

## 2020-02-25 LAB — HEPATITIS B SURFACE ANTIGEN: Hepatitis B Surface Ag: NONREACTIVE

## 2020-02-25 LAB — HEPATITIS B SURFACE ANTIBODY,QUALITATIVE: Hep B S Ab: NONREACTIVE

## 2020-02-25 LAB — AMMONIA: Ammonia: 38 umol/L — ABNORMAL HIGH (ref 9–35)

## 2020-02-25 MED ORDER — HEPARIN SODIUM (PORCINE) 1000 UNIT/ML IJ SOLN
INTRAMUSCULAR | Status: AC
  Administered 2020-02-25: 4000 [IU]
  Filled 2020-02-25: qty 4

## 2020-02-25 MED ORDER — LACTULOSE 10 GM/15ML PO SOLN
20.0000 g | Freq: Three times a day (TID) | ORAL | Status: DC
  Administered 2020-02-25 – 2020-03-07 (×31): 20 g
  Filled 2020-02-25 (×31): qty 30

## 2020-02-25 MED ORDER — PROMETHAZINE HCL 25 MG/ML IJ SOLN
INTRAMUSCULAR | Status: AC
  Filled 2020-02-25: qty 1

## 2020-02-25 MED ORDER — DARBEPOETIN ALFA 200 MCG/0.4ML IJ SOSY
PREFILLED_SYRINGE | INTRAMUSCULAR | Status: AC
  Administered 2020-02-25: 200 ug
  Filled 2020-02-25: qty 0.4

## 2020-02-25 NOTE — Progress Notes (Signed)
**Note De-identified vi Obfusction** PROGRESS NOTE    Bailey Cox  MRN:[id number] DOB: 08/25/1946 DOA: 01/05/2020 PCP: Ptient, No Pcp Per   Chief Complint  Ptient presents with  . Abnorml Lb  . Leg Swelling    Brief Nrrtive:  73-yer-old femle with history of cholecystitis sttus post cholecystectomy in 9/20 t [facility].  She ws dischrged to SNF but rehospitlized to [facility] nd hd bile duct stent.  Dischrged bck to SNF but redmitted with Ogilive syndrome.  Hospitl course complicted by fluid overlod nd trnsferred to [facility] for dilysis.  At [facility], developed respirtory filure requiring vent support.  She underwent trch.she ws eventully dischrged to [facility] on 3/1 fter prolonged hospitliztion t [facility] but deemed too sick, nd sent to [facility] due to fluid overlod nd cidosis.  Ptient ws initilly dmitted to ICU.  Required CRRT 3/9-3/12.  Trnsfer to floor on 3/14.  She pulled her trch.  Now on oxygen by nsl cnnul.  She is on IHD vi TDC.  Hs refused dilysis t times but deemed to lck cpcity.  Fmily wnts full scope of cre including full code nd dilysis.  She hs been noncomplint with cre nd hemodilysis but lcks cpcity. Ethics committee meeting held on 4/6.  The pln is to continue full scope of cre per fmily's wish.  Finlly she tolerted HD in recliner chir on 4/19 without sedting medictions.  She will be dischrged to SNF once she hd outptient HD spot nd continues to tolerte HD on recliner chir.  Assessment & Pln:   Active Problems:   Shock (HCC)   Renl filure   Trcheostomy sttus (HCC)   Chronic hypoxemic respirtory filure (HCC)   Hypotension   Sepsis (HCC)   Acute hypoxemic respirtory filure (HCC)   Acute kidney injury (HCC)   Anemi due to stge 4 chronic kidney disese (HCC)   Abdominl pin   Gols of cre, counseling/discussion   Depression, recurrent (HCC)   Pllitive cre by specilist   Wekness   Pressure injury  of skin  Acute metbolic encephlopthy- lethrgic tody.  Per fmily t bedside, she's been like this since [place].  Multiple potentil etiologies including zotemi, itrogenic, delirium. No focl neuro deficits.  No obvious infectious signs.  Just completed CTX nd zithromycin for spirtion pneumoni.  Ammoni slightly elevted.  TSH, B12 nd RPR within norml.  Now with hypothermi unknown etiology.  -Continue lctulose 20 g TID dily for elevted mmoni -VBG with elevted pCO2, but norml pH -> given AMS, will tril bipp if ble to tolerte - Follow UA nd culture -Hold Seroquel  -Discontinue oxycodone nd trzodone -Reduce fentnyl -Frequent orienttion nd delirium precutions. -Hypothermi work-up s below.  Acute kidney injury on chronic kidney disese, stge IV-likely due to ATN from sepsis -Noncomplint with HD but lcks cpcity.  Continue HD in recliner. -Fmily to sty t bedside during HD for complince.  Also using s needed Hldol for HD. -Nephrology mnging  MRSA/Pseudomons eruginos pneumoni-previously completed ntibiotic course for this. Aspirtion pneumoni 02/16/2020-respirtory filure improving. -CXR on 4/14 with RML, RLL nd Left mid lung opcities concerning for multifocl pneumoni -Completed 5 dys of CTX nd zithromycin on 4/16. - CXR 4/21 with moderte to lrge R effusion (unchnged) with persistent dense irspce disese t the right middle lobe nd R bse s well s  probble smll L effusion with slightly improved ertion t the L bse with residul telectsis vs pneumoni t the L medil bse - continue to monitor, will need to follow R effusion ->  **Note De-identified vi Obfusction** consider thor -Aspirtion precutions.  Acute on chronic hypoxic respirtory filure-likely due to spirtion pneumoni nd fluid overlod.  Sturting in mid 90s on 3 L by [facility] [place].  No respirtory distress.  Decnnulted on 3/19. -Wen oxygen s ble.  Miniml oxygen to keep sturtion bove 88%.    -Continue encourging CPT.  Hypothermi: Uncler etiology.  No fever or leukocytosis to suggest infectious process.  Stble from respirtory stndpoint.  Hemodynmiclly stble. -Ber Hugger -Check TSH (wnl), urinlysis, urine culture (pending), blood culture (pending) nd lctic cid (wnl) -No ntibiotics t this time.  DM-2 with hyperglycemi: A1c 5.7%. -Continue SSI-moderte every 4 hours -Continue NovoLog 2 units every 4 hours -Continue Lntus 8 units twice dily.  Essentil hypertension: Normotensive -Continue PRN metoprolol  Iron deficiency nemi with nemi of chronic kidney disese: Hgb 9.4 (dmit)>>> 7.4> 8.0 .  Iron st 5%. -ESA nd IV iron per nephrology  Hypoklemi: Resolved. -Continue monitoring  Hypontremi: Resolved.  Anxiety/depression/gittion -Continue Pxil nd BuSpr.  Discontinue trzodone. -Frequent orienttion nd delirium precutions -As needed Hldol nd scheduled Seroquel.   GERD/constiption -Continue PPI nd bowel regimen  Mild thrombocytopeni: Stble. -Continue monitoring  Generlized wekness/debility -PT/OT-not ble to prticipte in therpy.  Requiring mechnicl lift to chir.  Chronic pin: -Scheduled Tylenol 1000 mg 3 times dily -Discontinued oxycodone nd reduced IV fentnyl due to AMS.  Gols of cre/Ethicl issues-complex medicl issue with gurded prognosis. Ptient refused HD previously but deemed to lck cpcity by psych. Dughter wishes her to be full code with full scope of cre including dilysis.  Continued on HD with the help of medictions nd fmily. -Pllitive cre following -Ethics involved previously.  Nutritionl support/dysphgi -Apprecite input by SLP-recommended dysphgi 2 diet. -Apprecite input by dietitin -Continue TF nd dysphgi 2 diet Nutrition Problem: Indequte orl intke Etiology: inbility to et  Signs/Symptoms: NPO sttus  Interventions: Tube feeding  Pressure  Injury:  Pressure Injury 02/16/20 Coccyx Stge 1 -  Intct skin with non-blnchble redness of  loclized re usully over  bony prominence. (Active)  02/16/20 1700  Loction: Coccyx  Loction Orienttion:   Stging: Stge 1 -  Intct skin with non-blnchble redness of  loclized re usully over  bony prominence.  Wound Description (Comments):   Present on Admission:    DVT prophylxis: SCD Code Sttus: full Fmily Communiction: dughter t bedside Disposition:   Sttus is: Inptient  Remins inptient pproprite becuse:Inptient level of cre pproprite due to severity of illness   Dispo: The ptient is from: [facility]              Anticipted d/c is to: pending              Anticipted d/c dte is: > 3 dys              Ptient currently is not mediclly stble to d/c.   Consultnts:   PCCM  Nephrology  PMT  Procedures:   none  Antimicrobils: Anti-infectives (From dmission, onwrd)   Strt     Dose/Rte Route Frequency Ordered Stop   02/16/20 1230  cefTRIAXone (ROCEPHIN) 2 g in sodium chloride 0.9 % 100 mL IVPB     2 g 200 mL/hr over 30 Minutes Intrvenous Every 24 hours 02/16/20 1127 02/20/20 0941   02/16/20 1230  zithromycin (ZITHROMAX) 500 mg in sodium chloride 0.9 % 250 mL IVPB     500 mg 250 mL/hr over 60 Minutes Intrvenous Every 24 hours 02/16/20 1127 02/20/20 1140   01/10/20 0930  vncomycin (VANCOCIN) IVPB 1000   mg/200 mL premix  Status:  Discontinued     1,000 mg 200 mL/hr over 60 Minutes Intravenous  Once 01/10/20 0915 01/13/20 0623   01/06/20 2200  ceFEPIme (MAXIPIME) 2 g in sodium chloride 0.9 % 100 mL IVPB     2 g 200 mL/hr over 30 Minutes Intravenous Every 24 hours 01/05/20 2251 01/11/20 2234   01/05/20 2200  ceFEPIme (MAXIPIME) 2 g in sodium chloride 0.9 % 100 mL IVPB     2 g 200 mL/hr over 30 Minutes Intravenous  Once 01/05/20 2148 01/06/20 0015   01/05/20 2200  metroNIDAZOLE (FLAGYL) IVPB 500 mg     500 mg 100 mL/hr over 60  Minutes Intravenous  Once 01/05/20 2148 01/06/20 0015   01/05/20 2200  vancomycin (VANCOCIN) IVPB 1000 mg/200 mL premix  Status:  Discontinued     1,000 mg 200 mL/hr over 60 Minutes Intravenous  Once 01/05/20 2148 01/05/20 2151   01/05/20 2200  vancomycin (VANCOREADY) IVPB 2000 mg/400 mL  Status:  Discontinued     2,000 mg 200 mL/hr over 120 Minutes Intravenous  Once 01/05/20 2151 01/05/20 2223      Subjective: Lethargic, confused  Objective: Vitals:   02/25/20 1330 02/25/20 1345 02/25/20 1400 02/25/20 1642  BP: 114/61 (!) 87/78 (!) 140/58 (!) 120/41  Pulse: 85 85 86 81  Resp:  _0 Temp:  97.8 F (36.6 C) 98.2 F (36.8 C) 98.2 F (36.8 C)  TempSrc:  Oral Oral Oral  SpO2:   100% 98%  Weight:      Height:        Intake/Output Summary (Last 24 hours) at 02/25/2020 1721 Last data filed at 02/25/2020 1600 Gross per 24 hour  Intake --  Output 1200 ml  Net -1200 ml   Filed Weights   02/24/20 0415 02/25/20 0500  Weight: 90.4 kg 92.1 kg    Examination:  General exam: Appears calm and comfortable  Respiratory system: Clear to auscultation. Respiratory effort normal. Cardiovascular system: S1 & S2 heard, RRR. Gastrointestinal system: Abdomen is nondistended, soft and nontender Central nervous system: lethargic, disoriented.  Follows commands. No focal neurological deficits. Extremities: no LEE Skin: No rashes, lesions or ulcers Psychiatry: Judgement and insight appear normal. Mood & affect appropriate.     Data Reviewed: I have personally reviewed following labs and imaging studies  CBC: Recent Labs  Lab 02/20/20 0318 02/21/20 1152 02/22/20 0029 02/23/20 2334 02/25/20 1020  WBC 8.6 7.4 6.5 7.4 8.2  HGB 7.4* 7.4* 7.6* 8.0* 7.5*  HCT 25.0* 26.4* 27.2* 29.1* 26.9*  MCV 94.7 96.7 96.8 97.3 97.8  PLT PLATELET CLUMPS NOTED ON SMEAR, UNABLE TO ESTIMATE 105* 106* 120* 138*    Basic Metabolic Panel: Recent Labs  Lab 02/19/20 0413 02/19/20 0413 02/20/20 0318  02/21/20 1152 02/22/20 0029 02/23/20 0318 02/25/20 1020  NA 133*   < > 135 133* 132* 135 139  K 4.0   < > 3.6 4.3 4.6 4.2 3.7  CL 95*   < > 95* 93* 94* 96* 104  CO2 27   < > _1 GLUCOSE 125*   < > 214* 228* 200* 108* 93  BUN 58*   < > 36* 71* 87* 51* 25*  CREATININE 2.06*   < > 1.48* 2.19* 2.47* 1.67* 1.30*  CALCIUM 8.6*   < > 8.7* 9.0 9.1 9.3 8.6*  MG 2.2  --  1.9 2.2 2.3 2.0  --   PHOS 2.3*   < >  2.1* 4.1 4.2 3.2 3.0   < > = values in this interval not displayed.    GFR: Estimated Creatinine Clearance: 43.7 mL/min () (by C-G formula based on SCr of 1.3 mg/dL (H)).  Liver Function Tests: Recent Labs  Lab 02/19/20 0413 02/19/20 0413 02/20/20 0318 02/21/20 1152 02/22/20 0029 02/23/20 0318 02/25/20 1020  ST 20  --   --   --   --   --  24  LT 17  --   --   --   --   --  17  LKPHOS 112  --   --   --   --   --  54  BILITOT 0.5  --   --   --   --   --  1.7*  PROT 6.1*  --   --   --   --   --  7.1  LBUMIN 2.4*  2.5*   < > 2.4* 2.1* 2.3* 2.5* 3.6   < > = values in this interval not displayed.    CBG: Recent Labs  Lab 02/24/20 2315 02/25/20 0502 02/25/20 0737 02/25/20 1416 02/25/20 1608  GLUCP 144* 183* 169* 112* 155*     Recent Results (from the past 240 hour(s))  Culture, blood (routine x 2) Call MD if unable to obtain prior to antibiotics being given     Status: None   Collection Time: 02/16/20 12:07 PM   Specimen: BLOOD LEFT HND  Result Value Ref Range Status   Specimen Description BLOOD LEFT HND  Final   Special Requests   Final    BOTTLES DRWN EROBIC ND NEROBIC Blood Culture results may not be optimal due to an inadequate volume of blood received in culture bottles   Culture   Final    NO GROWTH 5 DYS Performed at [facility], [address] [place], [place] [address]    Report Status 02/21/2020 FINL  Final  Culture, blood (routine x 2) Call MD if unable to obtain prior to antibiotics being given     Status: None    Collection Time: 02/16/20 12:16 PM   Specimen: BLOOD LEFT HND  Result Value Ref Range Status   Specimen Description BLOOD LEFT HND  Final   Special Requests   Final    BOTTLES DRWN EROBIC ND NEROBIC Blood Culture adequate volume   Culture   Final    NO GROWTH 5 DYS Performed at [facility], [address] [place], [place] [address]    Report Status 02/21/2020 FINL  Final  Culture, blood (routine x 2)     Status: None (Preliminary result)   Collection Time: 02/24/20 11:52 M   Specimen: BLOOD  Result Value Ref Range Status   Specimen Description BLOOD RIGHT NTECUBITL  Final   Special Requests   Final    BOTTLES DRWN EROBIC ONLY Blood Culture results may not be optimal due to an inadequate volume of blood received in culture bottles   Culture   Final    NO GROWTH < 24 HOURS Performed at [facility], [address] [place], [place] [address]    Report Status PENDING  Incomplete  Culture, blood (routine x 2)     Status: None (Preliminary result)   Collection Time: 02/24/20 11:52 M   Specimen: BLOOD  Result Value Ref Range Status   Specimen Description BLOOD LEFT NTECUBITL  Final   Special Requests   Final    BOTTLES DRWN EROBIC ND   ANAEROBIC Blood Culture results may not be optimal due to an inadequate volume of blood received in culture bottles   Culture   Final    NO GROWTH < 24 HOURS Performed at [facility] [address] [place], [place] [address]    Report Status PENDING  Incomplete         Radiology Studies: DG CHEST PORT 1 VIEW  Result Date: 02/25/2020 CLINICAL DATA:  Abnormal chest x-ray EXAM: PORTABLE CHEST 1 VIEW COMPARISON:  02/18/2020 FINDINGS: Right-sided central venous catheter tip over the right atrium. Moderate to large right pleural effusion without significant change. Probable small left effusion. Enlarged cardiomediastinal silhouette. Slightly improved aeration at the left base with residual atelectasis or  pneumonia. No change in dense right middle lobe and basilar consolidation. IMPRESSION: 1. Moderate to large right pleural effusion grossly unchanged with persistent dense airspace disease at the right middle lobe and right base 2. Probable small left pleural effusion. Slightly improved aeration at left base with residual atelectasis or pneumonia at the medial left base 3. Cardiomegaly Electronically Signed   By: Donavan Foil M.D.   On: 02/25/2020 16:49        Scheduled Meds: . acetaminophen (TYLENOL) oral liquid 160 mg/5 mL  1,000 mg Per Tube Q8H  . bisacodyl  10 mg Rectal Daily  . busPIRone  5 mg Per Tube BID  . calcium acetate  667 mg Oral TID WC  . camphor-menthol   Topical QID  . chlorhexidine gluconate (MEDLINE KIT)  15 mL Mouth Rinse BID  . Chlorhexidine Gluconate Cloth  6 each Topical Q0600  . darbepoetin (ARANESP) injection - DIALYSIS  200 mcg Subcutaneous Q Wed-HD  . feeding supplement (NEPRO CARB STEADY)  1,000 mL Per Tube Q24H  . feeding supplement (PRO-STAT SUGAR FREE 64)  30 mL Per Tube TID  . insulin aspart  0-15 Units Subcutaneous Q4H  . insulin aspart  2 Units Subcutaneous Q4H  . insulin glargine  8 Units Subcutaneous BID  . lactulose  20 g Per Tube TID  . pantoprazole sodium  40 mg Per Tube BID  . PARoxetine  40 mg Per Tube Daily  . sodium chloride flush  10-40 mL Intracatheter Q12H   Continuous Infusions: . ferric gluconate (FERRLECIT/NULECIT) IV Stopped (02/25/20 1440)     LOS: 51 days    Time spent: over 30 min    Fayrene Helper, MD [facility]   To contact the attending provider between 7A-7P or the covering provider during after hours 7P-7A, please log into the web site [email or web address] and access using universal [place] password for that web site. If you do not have the password, please call the hospital operator.  02/25/2020, 5:21 PM

## 2020-02-25 NOTE — Procedures (Signed)
I was present at this dialysis session. I have reviewed the session itself and made appropriate changes.   In recliner for 2nd consecutive treatment. Tolerating well. Daughter at besdide, updated.  Has TDC, not medically ready for AVF/G.  3K bath. UF goal 1L. Plan for next Tx 4/24 2nd shift so that family can be present again.   ESA and Fe today  Filed Weights   02/23/20 0500 02/24/20 0415 02/25/20 0500  Weight: 89.7 kg 90.4 kg 92.1 kg    Recent Labs  Lab 02/25/20 1020  NA 139  K 3.7  CL 104  CO2 26  GLUCOSE 93  BUN 25*  CREATININE 1.30*  CALCIUM 8.6*  PHOS 3.0    Recent Labs  Lab 02/22/20 0029 02/23/20 2334 02/25/20 1020  WBC 6.5 7.4 8.2  HGB 7.6* 8.0* 7.5*  HCT 27.2* 29.1* 26.9*  MCV 96.8 97.3 97.8  PLT 106* 120* 138*    Scheduled Meds: . heparin sodium (porcine)      . acetaminophen (TYLENOL) oral liquid 160 mg/5 mL  1,000 mg Per Tube Q8H  . bisacodyl  10 mg Rectal Daily  . busPIRone  5 mg Per Tube BID  . calcium acetate  667 mg Oral TID WC  . camphor-menthol   Topical QID  . chlorhexidine gluconate (MEDLINE KIT)  15 mL Mouth Rinse BID  . Chlorhexidine Gluconate Cloth  6 each Topical Q0600  . darbepoetin (ARANESP) injection - DIALYSIS  200 mcg Subcutaneous Q Wed-HD  . feeding supplement (NEPRO CARB STEADY)  1,000 mL Per Tube Q24H  . feeding supplement (PRO-STAT SUGAR FREE 64)  30 mL Per Tube TID  . insulin aspart  0-15 Units Subcutaneous Q4H  . insulin aspart  2 Units Subcutaneous Q4H  . insulin glargine  8 Units Subcutaneous BID  . lactulose  20 g Per Tube TID  . pantoprazole sodium  40 mg Per Tube BID  . PARoxetine  40 mg Per Tube Daily  . sodium chloride flush  10-40 mL Intracatheter Q12H   Continuous Infusions: . ferric gluconate (FERRLECIT/NULECIT) IV     PRN Meds:.feeding supplement (NEPRO CARB STEADY), fentaNYL (SUBLIMAZE) injection, guaiFENesin, haloperidol lactate, metoprolol tartrate, ondansetron (ZOFRAN) IV, phenol, promethazine, sodium  chloride flush   Pearson Grippe  MD 02/25/2020, 11:45 AM

## 2020-02-25 NOTE — Progress Notes (Signed)
Pt received from HD. VSS. Call bell in reach. Will continue to monitor.  Lisel Siegrist R Marisa Hage, RN  

## 2020-02-25 NOTE — Progress Notes (Signed)
RT attempted to reapply BiPAP on patient and patient stated that she didn't want to wear the mask. Patient vitals are WNL's and no distress noted at this time. Patient is on 4L [place] and resting comfortably.RN made aware.

## 2020-02-25 NOTE — Progress Notes (Signed)
Renal Navigator met with patient and daughter at HD bedside. Patient in recliner and seems relaxed. Asked to leave treatment a few times, but easily redirected by her daughter. Daughter is very supportive and continues to want supportive treatment for her mother. She is hopeful that her mother will be able to discharge closer to home to a SNF soon so that she and other family members will be able to visit (at least through a window for a period of time) until patient can get stronger and come home. Navigator stated to daughter, as we have discussed before, that patient gets agitated at times during treatment and asks to leave. At times she states that she is scared, though she cannot tell staff what she is scared of. Navigator states that she will need a family member as a Actuary while on HD treatment in an outpatient clinic, as long as clinic allows. Navigator asked daughter who this person will be. [name] states she will be the one to sit with her mother, as she is the main caregiver. She states she has already filed for [employer] and will figure out the rest as the time comes. She seems very committed to her mother.  Navigator discussed OP HD clinic referral and [name] states that [facility] in [place] is closest to her home and the SNFs she will be asking CSW to evaluate for her mother.  OP HD referral to [facility] to request treatment at their [facility] location submitted. This may need to change based on SNF and Navigator will continue to follow closely. Navigator notes that patient needs updated HepB panel to complete referral. Navigator asked Dr. Joelyn Oms to order and will fax updated results when available.  Alphonzo Cruise, [place] Renal Navigator [phone]

## 2020-02-25 NOTE — TOC Progression Note (Signed)
Transition of Care [facility]) - Progression Note    Patient Details  Name: Kaiya Boatman MRN: [id number] Date of Birth: 12-Aug-1946  Transition of Care [facility]) CM/SW [place], [place] Phone Number: 02/25/2020, 4:57 PM  Clinical Narrative:     Faxed clinicals to Caryl Pina at [facility] and [facility].  CSW will follow up on referral tomorrow.  Thurmond Butts, MSW, [place] Clinical Social Worker   Expected Discharge Plan: Skilled Nursing Facility Barriers to Discharge: Continued Medical Work up, Other (comment)(ability to tolerate dialysis in recliner)  Expected Discharge Plan and Services Expected Discharge Plan: [place] In-house Referral: Clinical Social Work Discharge Planning Services: CM Consult Post Acute Care Choice: [place] Living arrangements for the past 2 months: (Has been hospitalized for over 4 months)                                       Social Determinants of Health (SDOH) Interventions    Readmission Risk Interventions Readmission Risk Prevention Plan 01/28/2020  Transportation Screening Complete  PCP or Specialist Appt within 3-5 Days Not Complete  Not Complete comments plan for SNF  HRI or [place] Not Complete  HRI or Home Care Consult comments plan for SNF  Social Work Consult for [place] Planning/Counseling Complete  Palliative Care Screening Complete  Medication Review [employer]) Referral to Pharmacy  PCP or Specialist appointment within 3-5 days of discharge Not Complete  PCP/Specialist Appt Not Complete comments plan for SNF  HRI or Home Care Consult Complete  SW Recovery Care/Counseling Consult Complete  Palliative Care Screening Not Complete  Comments appropriate at this time  [place] Complete

## 2020-02-26 ENCOUNTER — Encounter (HOSPITAL_COMMUNITY): Payer: Self-pay | Admitting: Internal Medicine

## 2020-02-26 ENCOUNTER — Other Ambulatory Visit: Payer: Self-pay

## 2020-02-26 ENCOUNTER — Inpatient Hospital Stay (HOSPITAL_COMMUNITY): Payer: Medicare Other

## 2020-02-26 DIAGNOSIS — J90 Pleural effusion, not elsewhere classified: Secondary | ICD-10-CM | POA: Diagnosis not present

## 2020-02-26 LAB — BLOOD GAS, VENOUS
Acid-base deficit: 0 mmol/L (ref 0.0–2.0)
Bicarbonate: 28.4 mmol/L — ABNORMAL HIGH (ref 20.0–28.0)
Drawn by: 3680
FIO2: 36
O2 Saturation: 88.7 %
Patient temperature: 37
pCO2, Ven: 88.5 mmHg (ref 44.0–60.0)
pH, Ven: 7.132 — CL (ref 7.250–7.430)
pO2, Ven: 60.3 mmHg — ABNORMAL HIGH (ref 32.0–45.0)

## 2020-02-26 LAB — BLOOD GAS, ARTERIAL
Acid-Base Excess: 0.8 mmol/L (ref 0.0–2.0)
Acid-Base Excess: 1.1 mmol/L (ref 0.0–2.0)
Bicarbonate: 28.3 mmol/L — ABNORMAL HIGH (ref 20.0–28.0)
Bicarbonate: 28 mmol/L (ref 20.0–28.0)
Drawn by: 53527
Drawn by: 590601
FIO2: 30
FIO2: 30
O2 Saturation: 96.1 %
O2 Saturation: 97.7 %
Patient temperature: 37.3
Patient temperature: 36.5
pCO2 arterial: 79.2 mmHg (ref 32.0–48.0)
pCO2 arterial: 67.7 mmHg (ref 32.0–48.0)
pH, Arterial: 7.181 — CL (ref 7.350–7.450)
pH, Arterial: 7.236 — ABNORMAL LOW (ref 7.350–7.450)
pO2, Arterial: 82.1 mmHg — ABNORMAL LOW (ref 83.0–108.0)
pO2, Arterial: 86 mmHg (ref 83.0–108.0)

## 2020-02-26 LAB — COMPREHENSIVE METABOLIC PANEL
ALT: 26 U/L (ref 0–44)
AST: 26 U/L (ref 15–41)
Albumin: 2.5 g/dL — ABNORMAL LOW (ref 3.5–5.0)
Alkaline Phosphatase: 241 U/L — ABNORMAL HIGH (ref 38–126)
Anion gap: 11 (ref 5–15)
BUN: 55 mg/dL — ABNORMAL HIGH (ref 8–23)
CO2: 29 mmol/L (ref 22–32)
Calcium: 9.2 mg/dL (ref 8.9–10.3)
Chloride: 93 mmol/L — ABNORMAL LOW (ref 98–111)
Creatinine, Ser: 1.67 mg/dL — ABNORMAL HIGH (ref 0.44–1.00)
GFR calc Af Amer: 35 mL/min — ABNORMAL LOW (ref >60)
GFR calc non Af Amer: 30 mL/min — ABNORMAL LOW (ref >60)
Glucose, Bld: 133 mg/dL — ABNORMAL HIGH (ref 70–99)
Potassium: 3.9 mmol/L (ref 3.5–5.1)
Sodium: 133 mmol/L — ABNORMAL LOW (ref 135–145)
Total Bilirubin: 0.4 mg/dL (ref 0.3–1.2)
Total Protein: 6.2 g/dL — ABNORMAL LOW (ref 6.5–8.1)

## 2020-02-26 LAB — CBC
HCT: 26.6 % — ABNORMAL LOW (ref 36.0–46.0)
Hemoglobin: 7.5 g/dL — ABNORMAL LOW (ref 12.0–15.0)
MCH: 27.1 pg (ref 26.0–34.0)
MCHC: 28.2 g/dL — ABNORMAL LOW (ref 30.0–36.0)
MCV: 96 fL (ref 80.0–100.0)
Platelets: 156 10*3/uL (ref 150–400)
RBC: 2.77 MIL/uL — ABNORMAL LOW (ref 3.87–5.11)
RDW: 17 % — ABNORMAL HIGH (ref 11.5–15.5)
WBC: 9.5 10*3/uL (ref 4.0–10.5)
nRBC: 1.8 % — ABNORMAL HIGH (ref 0.0–0.2)

## 2020-02-26 LAB — GLUCOSE, CAPILLARY
Glucose-Capillary: 121 mg/dL — ABNORMAL HIGH (ref 70–99)
Glucose-Capillary: 121 mg/dL — ABNORMAL HIGH (ref 70–99)
Glucose-Capillary: 142 mg/dL — ABNORMAL HIGH (ref 70–99)
Glucose-Capillary: 144 mg/dL — ABNORMAL HIGH (ref 70–99)
Glucose-Capillary: 48 mg/dL — ABNORMAL LOW (ref 70–99)
Glucose-Capillary: 75 mg/dL (ref 70–99)
Glucose-Capillary: 98 mg/dL (ref 70–99)

## 2020-02-26 LAB — BODY FLUID CELL COUNT WITH DIFFERENTIAL
Eos, Fluid: 0 %
Lymphs, Fluid: 15 %
Monocyte-Macrophage-Serous Fluid: 7 % — ABNORMAL LOW (ref 50–90)
Neutrophil Count, Fluid: 78 % — ABNORMAL HIGH (ref 0–25)
Total Nucleated Cell Count, Fluid: 340 cu mm (ref 0–1000)

## 2020-02-26 LAB — LACTATE DEHYDROGENASE, PLEURAL OR PERITONEAL FLUID: LD, Fluid: 166 U/L — ABNORMAL HIGH (ref 3–23)

## 2020-02-26 LAB — URINE CULTURE: Culture: NO GROWTH

## 2020-02-26 LAB — LACTATE DEHYDROGENASE: LDH: 161 U/L (ref 98–192)

## 2020-02-26 LAB — AMMONIA: Ammonia: 55 umol/L — ABNORMAL HIGH (ref 9–35)

## 2020-02-26 LAB — PROTEIN, PLEURAL OR PERITONEAL FLUID: Total protein, fluid: 3.4 g/dL

## 2020-02-26 LAB — ALBUMIN, PLEURAL OR PERITONEAL FLUID: Albumin, Fluid: 1.5 g/dL

## 2020-02-26 LAB — PROTEIN, TOTAL: Total Protein: 6.3 g/dL — ABNORMAL LOW (ref 6.5–8.1)

## 2020-02-26 LAB — ALBUMIN: Albumin: 2.4 g/dL — ABNORMAL LOW (ref 3.5–5.0)

## 2020-02-26 MED ORDER — DEXTROSE 50 % IV SOLN
INTRAVENOUS | Status: AC
  Filled 2020-02-26: qty 50

## 2020-02-26 MED ORDER — DEXTROSE-NACL 5-0.45 % IV SOLN: INTRAVENOUS | Status: DC

## 2020-02-26 MED ORDER — DEXTROSE 50 % IV SOLN
25.0000 mL | Freq: Once | INTRAVENOUS | Status: AC
  Administered 2020-02-26: 25 mL via INTRAVENOUS

## 2020-02-26 MED ORDER — SODIUM CHLORIDE 0.9 % IV SOLN
1.0000 g | Freq: Every day | INTRAVENOUS | Status: DC
  Administered 2020-02-26: 1 g via INTRAVENOUS
  Filled 2020-02-26: qty 1
  Filled 2020-02-26: qty 10

## 2020-02-26 NOTE — Progress Notes (Signed)
Assisted Dr Bailey Cox with bedside procedure:  Thoracentesis.  Patient tolerated well

## 2020-02-26 NOTE — Progress Notes (Signed)
Updated HepB panel faxed to [facility]. Renal Navigator will continue to follow closely.  Alphonzo Cruise, [place] Renal Navigator [phone]

## 2020-02-26 NOTE — Progress Notes (Signed)
PROGRESS NOTE    Bailey Cox  HMC:[id number] DOB: June 03, 1946 DOA: 01/05/2020 PCP: Patient, No Pcp Per   Chief Complaint  Patient presents with  . Abnormal Lab  . Leg Swelling    Brief Narrative:  74 year old female with history of cholecystitis status post cholecystectomy in 9/20 at [facility].  She was discharged to SNF but rehospitalized to [facility] and had bile duct stent.  Discharged back to SNF but readmitted with Ogilive syndrome.  Hospital course complicated by fluid overload and transferred to [facility] for dialysis.  At [facility], developed respiratory failure requiring vent support.  She underwent trach.she was eventually discharged to [facility] on 3/1 after prolonged hospitalization at [facility] but deemed too sick, and sent to [facility] due to fluid overload and acidosis.  Patient was initially admitted to ICU.  Required CRRT 3/9-3/12.  Transfer to floor on 3/14.  She pulled her trach.  Now on oxygen by nasal cannula.  She is on IHD via [place].  Has refused dialysis at times but deemed to lack capacity.  Family wants full scope of care including full code and dialysis.  She has been noncompliant with care and hemodialysis but lacks capacity. Ethics committee meeting held on 4/6.  The plan is to continue full scope of care per family's wish.  Finally she tolerated HD in recliner chair on 4/19 without sedating medications.  She will be discharged to SNF once she had outpatient HD spot and continues to tolerate HD on recliner chair.  Assessment & Plan:   Active Problems:   Shock ([place])   Renal failure   Tracheostomy status ([place])   Chronic hypoxemic respiratory failure (HCC)   Hypotension   Sepsis ([place])   Acute hypoxemic respiratory failure (HCC)   Acute kidney injury ([place])   Anemia due to stage 4 chronic kidney disease (HCC)   Abdominal pain   Goals of care, counseling/discussion   Depression, recurrent ([place])   Palliative care by specialist   Weakness   Pressure injury  of skin   Acute metabolic encephalopathy  Acute metabolic encephalopathy- lethargic today.  Per family at bedside, she's been like this since Saturday.  Multiple potential etiologies including azotemia, iatrogenic, delirium. No focal neuro deficits.  No obvious infectious signs.  Just completed CTX and azithromycin for aspiration pneumonia.  Ammonia slightly elevated.  TSH, B12 and RPR within normal.  Now with hypothermia unknown etiology.  -Continue lactulose 20 g TID daily for elevated ammonia -VBG with elevated pCO2, but normal pH -> given AMS, will trial bipap if able to tolerate (she refused this yesterday, will repeat VBG today) - Follow UA and culture -> UA concerning for UTI, will start ceftriaxone -Hold Seroquel and haldol -Discontinue oxycodone and trazodone -Reduce fentanyl -Frequent orientation and delirium precautions. -Hypothermia work-up as below.  MRSA/Pseudomonas aeruginosa pneumonia  Aspiration Pneumonia  Right Sided Effusion -previously completed antibiotic course for this. -CXR on 4/14 with RML, RLL and Left mid lung opacities concerning for multifocal pneumonia -Completed 5 days of CTX and azithromycin on 4/16. - CXR 4/21 with moderate to large R effusion (unchanged) with persistent dense airspace disease at the right middle lobe and R base as well as Tulip Meharg probable small L effusion with slightly improved aeration at the L base with residual atelectasis vs pneumonia at the L medial base - continue to monitor, will need to follow R effusion -> consider thora -> will c/s pulm for thoracentesis -Aspiration precautions.  Hypothermia: Unclear etiology.  No fever or leukocytosis  to suggest infectious process.  Temp borderline elevated 4/21, 99.7.  Stable from respiratory standpoint.  Hemodynamically stable. -Bear Hugger -Check TSH (wnl), urinalysis, urine culture (NG), blood culture (NGTD) and lactic acid (wnl) -Follow urine culture  Acute kidney injury on chronic kidney  disease, stage IV-likely due to ATN from sepsis -Noncompliant with HD but lacks capacity.  Continue HD in recliner. -Family to stay at bedside during HD for compliance.  Also using as needed Haldol for HD. -Nephrology managing  Acute on chronic hypoxic respiratory failure-likely due to aspiration pneumonia and fluid overload.  Saturating in mid 90s on 3 L by .  No respiratory distress.  Decannulated on 3/19. -Wean oxygen as able.  Minimal oxygen to keep saturation above 88%.  -Continue encouraging CPT.  DM-2 with hyperglycemia: A1c 5.7%. -Continue SSI-moderate every 4 hours -Continue NovoLog 2 units every 4 hours -Continue Lantus 8 units twice daily.  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  Iron sat 5%. -ESA and IV iron per nephrology -stable  Hypokalemia: Resolved. -Continue monitoring  Hyponatremia: Resolved.  Anxiety/depression/agitation -Continue Paxil and BuSpar.  Discontinue trazodone. -Frequent orientation and delirium precautions -As needed Haldol and scheduled Seroquel.   GERD/constipation -Continue PPI and bowel regimen  Mild thrombocytopenia: Stable -> resolved. -Continue monitoring  Generalized weakness/debility -PT/OT-not able to participate in therapy.  Requiring mechanical lift to chair.  Chronic pain: -Scheduled Tylenol 1000 mg 3 times daily -Discontinued oxycodone and reduced IV fentanyl due to AMS.  Goals of care/Ethical issues-complex medical issue with guarded prognosis. Patient refused HD previously but deemed to lack capacity by psych. Daughter wishes her to be full code with full scope of care including dialysis.  Continued on HD with the help of medications and family. -Palliative care following -Ethics involved previously.  Nutritional support/dysphagia -Appreciate input by SLP-recommended dysphagia 2  diet. -Appreciate input by dietitian -Continue TF and dysphagia 2 diet Nutrition Problem: Inadequate oral intake Etiology: inability to eat  Signs/Symptoms: NPO status  Interventions: Tube feeding  Pressure Injury:  Pressure Injury 02/16/20 Coccyx Stage 1 -  Intact skin with non-blanchable redness of Mazelle Huebert localized area usually over Shirleen Mcfaul bony prominence. (Active)  02/16/20 1700  Location: Coccyx  Location Orientation:   Staging: Stage 1 -  Intact skin with non-blanchable redness of Yanice Maqueda localized area usually over Elena Davia bony prominence.  Wound Description (Comments):   Present on Admission:    DVT prophylaxis: SCD Code Status: full Family Communication: daughter over phone Disposition:   Status is: Inpatient  Remains inpatient appropriate because:Inpatient level of care appropriate due to severity of illness   Dispo: The patient is from: [facility]              Anticipated d/c is to: pending              Anticipated d/c date is: > 3 days              Patient currently is not medically stable to d/c.   Consultants:   PCCM  Nephrology  PMT  Procedures:   none  Antimicrobials: Anti-infectives (From admission, onward)   Start     Dose/Rate Route Frequency Ordered Stop   02/26/20 1300  cefTRIAXone (ROCEPHIN) 1 g in sodium chloride 0.9 % 100 mL IVPB     1 g 200 mL/hr over 30 Minutes Intravenous Daily 02/26/20 1201     02/16/20 1230  cefTRIAXone (ROCEPHIN) 2 g  in sodium chloride 0.9 % 100 mL IVPB     2 g 200 mL/hr over 30 Minutes Intravenous Every 24 hours 02/16/20 1127 02/20/20 0941   02/16/20 1230  azithromycin (ZITHROMAX) 500 mg in sodium chloride 0.9 % 250 mL IVPB     500 mg 250 mL/hr over 60 Minutes Intravenous Every 24 hours 02/16/20 1127 02/20/20 1140   01/10/20 0930  vancomycin (VANCOCIN) IVPB 1000 mg/200 mL premix  Status:  Discontinued     1,000 mg 200 mL/hr over 60 Minutes Intravenous  Once 01/10/20 0915 01/13/20 0623   01/06/20 2200  ceFEPIme (MAXIPIME) 2 g in  sodium chloride 0.9 % 100 mL IVPB     2 g 200 mL/hr over 30 Minutes Intravenous Every 24 hours 01/05/20 2251 01/11/20 2234   01/05/20 2200  ceFEPIme (MAXIPIME) 2 g in sodium chloride 0.9 % 100 mL IVPB     2 g 200 mL/hr over 30 Minutes Intravenous  Once 01/05/20 2148 01/06/20 0015   01/05/20 2200  metroNIDAZOLE (FLAGYL) IVPB 500 mg     500 mg 100 mL/hr over 60 Minutes Intravenous  Once 01/05/20 2148 01/06/20 0015   01/05/20 2200  vancomycin (VANCOCIN) IVPB 1000 mg/200 mL premix  Status:  Discontinued     1,000 mg 200 mL/hr over 60 Minutes Intravenous  Once 01/05/20 2148 01/05/20 2151   01/05/20 2200  vancomycin (VANCOREADY) IVPB 2000 mg/400 mL  Status:  Discontinued     2,000 mg 200 mL/hr over 120 Minutes Intravenous  Once 01/05/20 2151 01/05/20 2223      Subjective: Lethargic, confused No c/o pain  Objective: Vitals:   02/26/20 0343 02/26/20 0729 02/26/20 0912 02/26/20 1125  BP:  (!) 114/54  (!) 125/54  Pulse:  81 82 88  Resp:  18 15 (!) 23  Temp:  98.3 F (36.8 C)  98.2 F (36.8 C)  TempSrc:  Axillary  Axillary  SpO2:  98% 100% 98%  Weight: 92.4 kg     Height:        Intake/Output Summary (Last 24 hours) at 02/26/2020 1547 Last data filed at 02/26/2020 1500 Gross per 24 hour  Intake 3635.33 ml  Output 200 ml  Net 3435.33 ml   Filed Weights   02/25/20 0500 02/26/20 0343  Weight: 92.1 kg 92.4 kg    Examination:  General: No acute distress. Cardiovascular: Heart sounds show Bakary Bramer regular rate, and rhythm.  Lungs: coarse breath sounds Abdomen: Soft, nontender, nondistended Neurological: lethargic, awakens to voice. Moves all extremities 4. Cranial nerves II through XII grossly intact. Skin: Warm and dry. No rashes or lesions. Extremities: No clubbing or cyanosis. No edema.     Data Reviewed: I have personally reviewed following labs and imaging studies  CBC: Recent Labs  Lab 02/21/20 1152 02/22/20 0029 02/23/20 2334 02/25/20 1020 02/26/20 0401  WBC 7.4  6.5 7.4 8.2 9.5  HGB 7.4* 7.6* 8.0* 7.5* 7.5*  HCT 26.4* 27.2* 29.1* 26.9* 26.6*  MCV 96.7 96.8 97.3 97.8 96.0  PLT 105* 106* 120* 138* 601    Basic Metabolic Panel: Recent Labs  Lab 02/20/20 0318 02/20/20 0318 02/21/20 1152 02/22/20 0029 02/23/20 0318 02/25/20 1020 02/26/20 0401  NA 135   < > 133* 132* 135 139 133*  K 3.6   < > 4.3 4.6 4.2 3.7 3.9  CL 95*   < > 93* 94* 96* 104 93*  CO2 24   < > '28 25 27 26 29  '$ GLUCOSE 214*   < > 228*  200* 108* 93 133*  BUN 36*   < > 71* 87* 51* 25* 55*  CREATININE 1.48*   < > 2.19* 2.47* 1.67* 1.30* 1.67*  CALCIUM 8.7*   < > 9.0 9.1 9.3 8.6* 9.2  MG 1.9  --  2.2 2.3 2.0  --   --   PHOS 2.1*  --  4.1 4.2 3.2 3.0  --    < > = values in this interval not displayed.    GFR: Estimated Creatinine Clearance: 34 mL/min (Rennee Coyne) (by C-G formula based on SCr of 1.67 mg/dL (H)).  Liver Function Tests: Recent Labs  Lab 02/21/20 1152 02/22/20 0029 02/23/20 0318 02/25/20 1020 02/26/20 0401  AST  --   --   --  24 26  ALT  --   --   --  17 26  ALKPHOS  --   --   --  54 241*  BILITOT  --   --   --  1.7* 0.4  PROT  --   --   --  7.1 6.2*  ALBUMIN 2.1* 2.3* 2.5* 3.6 2.5*    CBG: Recent Labs  Lab 02/25/20 2047 02/25/20 2316 02/26/20 0346 02/26/20 0820 02/26/20 1124  GLUCAP 108* 126* 144* 121* 142*     Recent Results (from the past 240 hour(s))  Culture, blood (routine x 2)     Status: None (Preliminary result)   Collection Time: 02/24/20 11:52 AM   Specimen: BLOOD  Result Value Ref Range Status   Specimen Description BLOOD RIGHT ANTECUBITAL  Final   Special Requests   Final    BOTTLES DRAWN AEROBIC ONLY Blood Culture results may not be optimal due to an inadequate volume of blood received in culture bottles   Culture   Final    NO GROWTH 2 DAYS Performed at [facility], [place] [address], [place], [place] [address]    Report Status PENDING  Incomplete  Culture, blood (routine x 2)     Status: None (Preliminary result)    Collection Time: 02/24/20 11:52 AM   Specimen: BLOOD  Result Value Ref Range Status   Specimen Description BLOOD LEFT ANTECUBITAL  Final   Special Requests   Final    BOTTLES DRAWN AEROBIC AND ANAEROBIC Blood Culture results may not be optimal due to an inadequate volume of blood received in culture bottles   Culture   Final    NO GROWTH 2 DAYS Performed at [facility], [place] [address], [place], [place] [address]    Report Status PENDING  Incomplete  Culture, Urine     Status: None   Collection Time: 02/25/20  4:00 PM   Specimen: Urine, Random  Result Value Ref Range Status   Specimen Description URINE, RANDOM  Final   Special Requests NONE  Final   Culture   Final    NO GROWTH Performed at [facility], [place] [address], [place], [place] [address]    Report Status 02/26/2020 FINAL  Final         Radiology Studies: DG CHEST PORT 1 VIEW  Result Date: 02/25/2020 CLINICAL DATA:  Abnormal chest x-ray EXAM: PORTABLE CHEST 1 VIEW COMPARISON:  02/18/2020 FINDINGS: Right-sided central venous catheter tip over the right atrium. Moderate to large right pleural effusion without significant change. Probable small left effusion. Enlarged cardiomediastinal silhouette. Slightly improved aeration at the left base with residual atelectasis or pneumonia. No change in dense right middle lobe and basilar consolidation. IMPRESSION: 1. Moderate to  large right pleural effusion grossly unchanged with persistent dense airspace disease at the right middle lobe and right base 2. Probable small left pleural effusion. Slightly improved aeration at left base with residual atelectasis or pneumonia at the medial left base 3. Cardiomegaly Electronically Signed   By: Donavan Foil M.D.   On: 02/25/2020 16:49        Scheduled Meds: . acetaminophen (TYLENOL) oral liquid 160 mg/5 mL  1,000 mg Per Tube Q8H  . bisacodyl  10 mg Rectal Daily  . busPIRone  5 mg Per Tube BID  . calcium acetate  667 mg Oral  TID WC  . camphor-menthol   Topical QID  . chlorhexidine gluconate (MEDLINE KIT)  15 mL Mouth Rinse BID  . Chlorhexidine Gluconate Cloth  6 each Topical Q0600  . darbepoetin (ARANESP) injection - DIALYSIS  200 mcg Subcutaneous Q Wed-HD  . feeding supplement (NEPRO CARB STEADY)  1,000 mL Per Tube Q24H  . feeding supplement (PRO-STAT SUGAR FREE 64)  30 mL Per Tube TID  . insulin aspart  0-15 Units Subcutaneous Q4H  . insulin aspart  2 Units Subcutaneous Q4H  . insulin glargine  8 Units Subcutaneous BID  . lactulose  20 g Per Tube TID  . pantoprazole sodium  40 mg Per Tube BID  . PARoxetine  40 mg Per Tube Daily  . sodium chloride flush  10-40 mL Intracatheter Q12H   Continuous Infusions: . cefTRIAXone (ROCEPHIN)  IV Stopped (02/26/20 1328)  . ferric gluconate (FERRLECIT/NULECIT) IV Stopped (02/25/20 1440)     LOS: 52 days    Time spent: over 30 min    Fayrene Helper, MD [facility]   To contact the attending provider between 7A-7P or the covering provider during after hours 7P-7A, please log into the web site [email or web address] and access using universal [place] password for that web site. If you do not have the password, please call the hospital operator.  02/26/2020, 3:47 PM

## 2020-02-26 NOTE — TOC Progression Note (Signed)
Transition of Care [facility]) - Progression Note    Patient Details  Name: Ellena Kamen MRN: [id number] Date of Birth: 08/04/1946  Transition of Care [facility]) CM/SW [place], [place] Phone Number: 02/26/2020, 3:01 PM  Clinical Narrative:     CSW called Ashley/Admission Coordinator at [facility] to return call.   Thurmond Butts, MSW, [place] Clinical Social Worker   Expected Discharge Plan: Skilled Nursing Facility Barriers to Discharge: Continued Medical Work up, Other (comment)(ability to tolerate dialysis in recliner)  Expected Discharge Plan and Services Expected Discharge Plan: [place] In-house Referral: Clinical Social Work Discharge Planning Services: CM Consult Post Acute Care Choice: [facility] Living arrangements for the past 2 months: (Has been hospitalized for over 4 months)                                       Social Determinants of Health (SDOH) Interventions    Readmission Risk Interventions Readmission Risk Prevention Plan 01/28/2020  Transportation Screening Complete  PCP or Specialist Appt within 3-5 Days Not Complete  Not Complete comments plan for SNF  HRI or [place] Not Complete  HRI or Home Care Consult comments plan for SNF  Social Work Consult for [place] Planning/Counseling Complete  Palliative Care Screening Complete  Medication Review Press photographer) Referral to Pharmacy  PCP or Specialist appointment within 3-5 days of discharge Not Complete  PCP/Specialist Appt Not Complete comments plan for SNF  HRI or Home Care Consult Complete  SW Recovery Care/Counseling Consult Complete  Palliative Care Screening Not Complete  Comments appropriate at this time  [place] Complete

## 2020-02-26 NOTE — Progress Notes (Signed)
Admit: 01/05/2020 LOS: 81  47F prolonged AKI now ESRD  Subjective:  . HD yesterday, tolerated in chair/recliner . Remains lethargic, primary working up . CLIP in process  04/21 0701 - 04/22 0700 In: 3535.3 [NG/GT:3425.3; IV Piggyback:110] Out: 1200 [Urine:200]  Filed Weights   02/25/20 0500 02/26/20 0343  Weight: 92.1 kg 92.4 kg    Scheduled Meds: . acetaminophen (TYLENOL) oral liquid 160 mg/5 mL  1,000 mg Per Tube Q8H  . bisacodyl  10 mg Rectal Daily  . busPIRone  5 mg Per Tube BID  . calcium acetate  667 mg Oral TID WC  . camphor-menthol   Topical QID  . chlorhexidine gluconate (MEDLINE KIT)  15 mL Mouth Rinse BID  . Chlorhexidine Gluconate Cloth  6 each Topical Q0600  . darbepoetin (ARANESP) injection - DIALYSIS  200 mcg Subcutaneous Q Wed-HD  . feeding supplement (NEPRO CARB STEADY)  1,000 mL Per Tube Q24H  . feeding supplement (PRO-STAT SUGAR FREE 64)  30 mL Per Tube TID  . insulin aspart  0-15 Units Subcutaneous Q4H  . insulin aspart  2 Units Subcutaneous Q4H  . insulin glargine  8 Units Subcutaneous BID  . lactulose  20 g Per Tube TID  . pantoprazole sodium  40 mg Per Tube BID  . PARoxetine  40 mg Per Tube Daily  . sodium chloride flush  10-40 mL Intracatheter Q12H   Continuous Infusions: . ferric gluconate (FERRLECIT/NULECIT) IV Stopped (02/25/20 1440)   PRN Meds:.feeding supplement (NEPRO CARB STEADY), guaiFENesin, haloperidol lactate, metoprolol tartrate, ondansetron (ZOFRAN) IV, phenol, promethazine, sodium chloride flush  Current Labs: reviewed    Physical Exam:  Blood pressure (!) 114/54, pulse 82, temperature 98.3 F (36.8 C), temperature source Axillary, resp. rate 15, height '5\' 4"'$  (1.626 m), weight 92.4 kg, SpO2 100 %. NAD Regular, nl s1s2 CTAB No sig LEE Trach site bandaged R IJ TDC  A 1. Dialysis dep't AKI now ESRD on THS schedule  2. Debility; full scope of care currenty; palliatove working with patient/family 3. Difficulting with tolerating  HD, now using recliner successfully 4. Anemia of CKD; ESA qThur 1. TSAT 5% 4/18, start Fe 5. Hx/o PEA arrest 6. Hx/o trach, decannulated 7. Lacks capacity for medical decision making   P . HD Saturday so that family can be present: 3.5h/ 400/800, 2K 1-2L UF, TDC, no heparin; in recliner . Hb downtrendig, recent inc to max ESA dosing; needs IV Fe started with HD 4/21 . Daily weights, Daily Renal Panel, Strict I/Os, Avoid nephrotoxins (NSAIDs, judicious IV Contrast)  . Medication Issues; o Preferred narcotic agents for pain control are hydromorphone, fentanyl, and methadone. Morphine should not be used.  o Baclofen should be avoided o Avoid oral sodium phosphate and magnesium citrate based laxatives / bowel preps    Pearson Grippe MD 02/26/2020, 11:18 AM  Recent Labs  Lab 02/22/20 0029 02/22/20 0029 02/23/20 0318 02/25/20 1020 02/26/20 0401  NA 132*   < > 135 139 133*  K 4.6   < > 4.2 3.7 3.9  CL 94*   < > 96* 104 93*  CO2 25   < > '27 26 29  '$ GLUCOSE 200*   < > 108* 93 133*  BUN 87*   < > 51* 25* 55*  CREATININE 2.47*   < > 1.67* 1.30* 1.67*  CALCIUM 9.1   < > 9.3 8.6* 9.2  PHOS 4.2  --  3.2 3.0  --    < > = values in this interval not  displayed.   Recent Labs  Lab 02/23/20 2334 02/25/20 1020 02/26/20 0401  WBC 7.4 8.2 9.5  HGB 8.0* 7.5* 7.5*  HCT 29.1* 26.9* 26.6*  MCV 97.3 97.8 96.0  PLT 120* 138* 156

## 2020-02-26 NOTE — Plan of Care (Signed)
  Problem: Education: Goal: Knowledge of General Education information will improve Description: Including pain rating scale, medication(s)/side effects and non-pharmacologic comfort measures Outcome: Not Progressing   Problem: Health Behavior/Discharge Planning: Goal: Ability to manage health-related needs will improve Outcome: Not Progressing   

## 2020-02-26 NOTE — Progress Notes (Signed)
Critical VBG results called to Dr. Florene Glen. Pt placed back on BiPAP and tube feed stopped. Will continue to monitor.  Clyde Canterbury, RN

## 2020-02-26 NOTE — Progress Notes (Signed)
Placed patient back on BiPAP per ABG results. 16/8 and 40%. Will continue to monitor.

## 2020-02-26 NOTE — Consult Note (Signed)
NAME:  Bailey Cox, MRN:  [id number], DOB:  [birth year], LOS: 31 ADMISSION DATE:  01/05/2020, CONSULTATION DATE:  02/26/20 REFERRING MD:  Florene Glen, CHIEF COMPLAINT:  Shortness of breath, pna, pleural effusion  Brief History   74 yo female transferred to [facility] on 3/1 from [place] after a long admission at [facility] (10/2019-01/2020) for cholecystitis s/p cholecystectomy with post surgical complications requring stent placement that was complicated by Ogilive syndrome. Additionally, she suffered PEA cardiac arrest during an endoscopy and ultimately requiring tracheostomy. Hospitalization was complicated by  fluid overload requiring hemodialysis, UTI, and MRSA pna. Per chart review, it sounds like she was discharged from [facility] on 01/05/20 to [facility] however once patient arrived at the facility, she deemed her too sick for admission and subsequently transferred her to [facility] with complaints of volume overload and acidosis.  History of present illness   Consulted by [facility] for progressive right sided pleural effusion. Pt unable to contribute to history due to cognitive impairment. CXR on 4/14 had shown RML, RLL, and Left sided opacities with a  Significant right sided effusion and received a 5d course of ctx and azithromycin completed on 4/16. Worsening shortness of breath over the last day and CXR obtained 4/20 for further evaluation which showed an unchanged effusion on the right.  Past Medical History  Chronic respiratory failure with hx of tracheostomy HFpEF, PEA cardiac arrest CKD DM HTN Ogilvie syndrome Vascular dementia Depression  Significant Hospital Events   3/1 admission 3/1-3/14 ICU 3/14 transfer to hospitalist service 4/6 ethics committee meeting resulting in continued full scope of care  Consults:  Nephrology PCCM  Procedures:  4/21 thoracentesis  Significant Diagnostic Tests:  4/20 CXR> moderate to large right pleural effusion grossly unchanged with persistent dense airspace  disease at the RML and RLL. Probable small left effusion. Slight improvement in aeration at left base.  Micro Data:  4/12 blood cultures>>NG 4/21 urine culture>>  Antimicrobials:  Azithromycin 4/12>02/08/15 Rocephin 4/12>4/16  Cefepime 3/01>3/07 Flagyl 3/01  Interim history/subjective:  See above  Objective   Blood pressure (!) 114/43, pulse 89, temperature 99.2 F (37.3 C), temperature source Oral, resp. rate (!) 29, height 5\' 4"  (1.626 m), weight 92.4 kg, SpO2 94 %.    FiO2 (%):  [30 %] 30 %   Intake/Output Summary (Last 24 hours) at 02/26/2020 1653 Last data filed at 02/26/2020 1500 Gross per 24 hour  Intake 3635.33 ml  Output --  Net 3635.33 ml   Filed Weights   02/25/20 0500 02/26/20 0343  Weight: 92.1 kg 92.4 kg    Examination: General: chronically ill appearing female HENT: atruamatic  Lungs: breathing comfortably on 4L [place]. Diminished lung sounds on the right Cardiovascular: RRR Abdomen: soft Extremities: warm Neuro: alert but not oriented to person, place or situation Skin: no rash  Resolved Hospital Problem list   n/a  Assessment & Plan:   Moderate to large right sided pleural effusion -consulted for thoracentesis 4/22. Tolerated well--0.9L removed. Did not appear infected so will hold off on abx at this point. LDH, cytology, protein, cultures sent. Chronic hypoxic respiratory failure Recurrent pneumonia.  -MRSA pseudomonas pna--treatment completed -Aspiration pna 02/16/20. Received 5d course of rocephin/azithromycin for this. Plan --follow up fluid studies --f/u post-procedure CXR --wean O2 as able --IS, deep breathing, aspiration precautions  HD dependent Acute on chronic renal failure. Management per nephrology.  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  -mildly elevated ammonia -likely also components of azotemia, delerium, etc involved. Plan --continue lactulose --  management per primary  Goals of care: see  notes from primary and ethics meeting dated 4/06. Remains full code with full scope of care at this time. Best practice:  Diet: NPO Pain/Anxiety/Delirium protocol (if indicated): per primary VAP protocol (if indicated): n/a DVT prophylaxis: SCD GI prophylaxis: n/a Glucose control: per primary Mobility: full assist Code Status: full Family Communication: per primary Disposition: may remain on floor.  Labs   CBC: Recent Labs  Lab 02/21/20 1152 02/22/20 0029 02/23/20 2334 02/25/20 1020 02/26/20 0401  WBC 7.4 6.5 7.4 8.2 9.5  HGB 7.4* 7.6* 8.0* 7.5* 7.5*  HCT 26.4* 27.2* 29.1* 26.9* 26.6*  MCV 96.7 96.8 97.3 97.8 96.0  PLT 105* 106* 120* 138* 662    Basic Metabolic Panel: Recent Labs  Lab 02/20/20 0318 02/20/20 0318 02/21/20 1152 02/22/20 0029 02/23/20 0318 02/25/20 1020 02/26/20 0401  NA 135   < > 133* 132* 135 139 133*  K 3.6   < > 4.3 4.6 4.2 3.7 3.9  CL 95*   < > 93* 94* 96* 104 93*  CO2 24   < > 28 25 27 26 29   GLUCOSE 214*   < > 228* 200* 108* 93 133*  BUN 36*   < > 71* 87* 51* 25* 55*  CREATININE 1.48*   < > 2.19* 2.47* 1.67* 1.30* 1.67*  CALCIUM 8.7*   < > 9.0 9.1 9.3 8.6* 9.2  MG 1.9  --  2.2 2.3 2.0  --   --   PHOS 2.1*  --  4.1 4.2 3.2 3.0  --    < > = values in this interval not displayed.   GFR: Estimated Creatinine Clearance: 34 mL/min (A) (by C-G formula based on SCr of 1.67 mg/dL (H)). Recent Labs  Lab 02/22/20 0029 02/23/20 2334 02/24/20 1428 02/24/20 1823 02/25/20 1020 02/26/20 0401  WBC 6.5 7.4  --   --  8.2 9.5  LATICACIDVEN  --   --  0.8 0.7  --   --     Liver Function Tests: Recent Labs  Lab 02/21/20 1152 02/22/20 0029 02/23/20 0318 02/25/20 1020 02/26/20 0401  AST  --   --   --  24 26  ALT  --   --   --  17 26  ALKPHOS  --   --   --  54 241*  BILITOT  --   --   --  1.7* 0.4  PROT  --   --   --  7.1 6.2*  ALBUMIN 2.1* 2.3* 2.5* 3.6 2.5*   No results for input(s): LIPASE, AMYLASE in the last 168 hours. Recent Labs  Lab  02/22/20 1129 02/23/20 2334 02/25/20 1512 02/26/20 0803  AMMONIA 52* 44* 38* 55*    ABG    Component Value Date/Time   PHART 7.225 (L) 01/06/2020 1305   PCO2ART 45.8 01/06/2020 1305   PO2ART 115.0 (H) 01/06/2020 1305   HCO3 29.9 (H) 02/25/2020 1510   TCO2 20 (L) 01/06/2020 1305   ACIDBASEDEF 8.0 (H) 01/06/2020 1305   O2SAT 92.5 02/25/2020 1510     Coagulation Profile: No results for input(s): INR, PROTIME in the last 168 hours.  Cardiac Enzymes: No results for input(s): CKTOTAL, CKMB, CKMBINDEX, TROPONINI in the last 168 hours.  HbA1C: Hgb A1c MFr Bld  Date/Time Value Ref Range Status  01/06/2020 02:30 AM 5.7 (H) 4.8 - 5.6 % Final    Comment:    (NOTE) Pre diabetes:  5.7%-6.4% Diabetes:              >6.4% Glycemic control for   <7.0% adults with diabetes     CBG: Recent Labs  Lab 02/25/20 2047 02/25/20 2316 02/26/20 0346 02/26/20 0820 02/26/20 1124  GLUCAP 108* 126* 144* 121* 142*    Review of Systems:   Unable to be obtained due to encephalopathy  Past Medical History  She,  has a past medical history of Chronic respiratory failure ([place]), CKD (chronic kidney disease), DM (diabetes mellitus) ([place]), GERD (gastroesophageal reflux disease), HTN (hypertension), Ogilvie syndrome, Tracheostomy status ([place]), and Vascular dementia ([place]).   Surgical History    Past Surgical History:  Procedure Laterality Date  . IR FLUORO GUIDE CV LINE RIGHT  01/10/2020  . IR US GUIDE VASC ACCESS RIGHT  01/10/2020     Social History      Family History   Her family history is not on file.   Allergies Allergies  Allergen Reactions  . Drug Class [Clindamycin/Lincomycin] Hives  . Drug Ingredient [Cephalexin] Hives    Pt has tolerated Ceftriaxone, Cefepime, and Meropenem   . Nsaids Hives    Unsure of which agents  . Penicillins Hives    Pt has tolerated Ceftriaxone, Cefepime, and Meropenem   . Sulfa Antibiotics Hives  . Dilaudid [Hydromorphone Hcl] Other (See  Comments)    Shortness of breath/confusion     Home Medications  Prior to Admission medications   Medication Sig Start Date End Date Taking? Authorizing Provider  acetylcysteine (MUCOMYST) 20 % nebulizer solution Take 2 mLs by nebulization every 6 (six) hours.   Yes [provider]  albuterol (PROVENTIL) (2.5 MG/3ML) 0.083% nebulizer solution Take 2.5 mg by nebulization every 6 (six) hours.   Yes [provider]  ALPRAZolam (XANAX) 0.25 MG tablet Take 0.25 mg by mouth 3 (three) times daily as needed for anxiety.   Yes [provider]  alum & mag hydroxide-simeth (MAALOX PLUS) 400-400-40 MG/5ML suspension Take 30 mLs by mouth every 6 (six) hours as needed for indigestion.   Yes [provider]  esomeprazole (NEXIUM) 40 MG packet Take 40 mg by mouth daily.   Yes [provider]  insulin glargine (LANTUS) 100 UNIT/ML injection Inject 5 Units into the skin at bedtime.   Yes [provider]  insulin lispro (HUMALOG) 100 UNIT/ML injection Inject 0-14 Units into the skin every 6 (six) hours.   Yes [provider]  lidocaine (LIDODERM) 5 % Place 1 patch onto the skin daily. Remove & Discard patch within 12 hours or as directed by MD   Yes [provider]  metoprolol tartrate (LOPRESSOR) 25 MG tablet Take 12.5 mg by mouth 2 (two) times daily.   Yes [provider]  Multiple Vitamins-Minerals (CENTAMIN) LIQD Take 15 mLs by mouth daily.   Yes [provider]  nystatin (MYCOSTATIN) 100000 UNIT/ML suspension Take 5 mLs by mouth 4 (four) times daily.   Yes [provider]  ondansetron (ZOFRAN) 4 MG/2ML SOLN injection Inject 4 mg into the vein every 8 (eight) hours as needed for nausea or vomiting.   Yes [provider]  PARoxetine (PAXIL) 30 MG tablet Take 60 mg by mouth daily.    Yes [provider]  polyethylene glycol (MIRALAX / GLYCOLAX) 17 g packet Take 17 g by mouth 2 (two) times daily as  needed for moderate constipation.    Yes [provider]  senna (SENOKOT) 8.6 MG TABS tablet Take 1 tablet  by mouth daily as needed for mild constipation.   Yes [provider]  traMADol (ULTRAM) 50 MG tablet Take 50 mg by mouth every 6 (six) hours as needed for moderate pain (up to 5 days).   Yes [provider]     Mitzi Hansen, MD [facility] PGY-1 02/26/20  4:53 PM

## 2020-02-26 NOTE — Procedures (Signed)
Thoracentesis Procedure Note  Pre-operative Diagnosis: RT effusion  Post-operative Diagnosis: same  Indications: Rt effusion ? parapneumonia  Procedure Details  Consent: Informed consent was obtained. Risks of the procedure were discussed including: infection, bleeding, pain, pneumothorax.  Under sterile conditions the patient was positioned upright with 2 Rns holding. [place] used to localize point of thora.  Betadine solution and sterile drapes were utilized.  2% plain plain was used to anesthetize the RT 7th rib space. Fluid was obtained without any difficulties and minimal blood loss.  A dressing was applied to the wound and wound care instructions were provided.   Findings 900 ml of clear pleural fluid was obtained. A sample was sent to lab for protein,LDH, glucose, G stain, and cell counts  Complications:  None; patient tolerated the procedure well.          Condition: stable  Plan A follow up chest x-ray was ordered. Bed Rest for 2 hours. Tylenol 650 mg. for pain.  Attending Attestation: I performed the procedure.

## 2020-02-26 NOTE — Progress Notes (Signed)
Renal Navigator received call from [facility] stating that patient has been accepted at the  [facility] clinic on a MWF schedule with a seat time of 12:00pm. Navigator informed Admissions Coordinator that patient is not ready for discharge at this time and that SNF search is still in process.  Renal Navigator plans to call clinic directly to discuss recommendation for family sitter to be allowed to accompany patient to treatment for her support and comfort when patient gets closer to discharge. This was also written on referral.  Navigator will continue to follow closely.   Alphonzo Cruise, [place] Renal Navigator [phone]

## 2020-02-27 ENCOUNTER — Inpatient Hospital Stay (HOSPITAL_COMMUNITY): Payer: Medicare Other

## 2020-02-27 DIAGNOSIS — J9601 Acute respiratory failure with hypoxia: Secondary | ICD-10-CM

## 2020-02-27 DIAGNOSIS — J9602 Acute respiratory failure with hypercapnia: Secondary | ICD-10-CM | POA: Diagnosis not present

## 2020-02-27 LAB — CBC WITH DIFFERENTIAL/PLATELET
Abs Immature Granulocytes: 0.23 10*3/uL — ABNORMAL HIGH (ref 0.00–0.07)
Basophils Absolute: 0 10*3/uL (ref 0.0–0.1)
Basophils Relative: 0 %
Eosinophils Absolute: 0 10*3/uL (ref 0.0–0.5)
Eosinophils Relative: 0 %
HCT: 26.3 % — ABNORMAL LOW (ref 36.0–46.0)
Hemoglobin: 7.5 g/dL — ABNORMAL LOW (ref 12.0–15.0)
Immature Granulocytes: 2 %
Lymphocytes Relative: 7 %
Lymphs Abs: 0.7 10*3/uL (ref 0.7–4.0)
MCH: 27 pg (ref 26.0–34.0)
MCHC: 28.5 g/dL — ABNORMAL LOW (ref 30.0–36.0)
MCV: 94.6 fL (ref 80.0–100.0)
Monocytes Absolute: 0.6 10*3/uL (ref 0.1–1.0)
Monocytes Relative: 6 %
Neutro Abs: 8.5 10*3/uL — ABNORMAL HIGH (ref 1.7–7.7)
Neutrophils Relative %: 85 %
Platelets: 204 10*3/uL (ref 150–400)
RBC: 2.78 MIL/uL — ABNORMAL LOW (ref 3.87–5.11)
RDW: 17.2 % — ABNORMAL HIGH (ref 11.5–15.5)
WBC: 10.1 10*3/uL (ref 4.0–10.5)
nRBC: 1.4 % — ABNORMAL HIGH (ref 0.0–0.2)

## 2020-02-27 LAB — BLOOD GAS, ARTERIAL
Acid-Base Excess: 0.3 mmol/L (ref 0.0–2.0)
Acid-Base Excess: 0.5 mmol/L (ref 0.0–2.0)
Acid-base deficit: 0.2 mmol/L (ref 0.0–2.0)
Bicarbonate: 26.6 mmol/L (ref 20.0–28.0)
Bicarbonate: 26.3 mmol/L (ref 20.0–28.0)
Bicarbonate: 25.5 mmol/L (ref 20.0–28.0)
Drawn by: 441371
Drawn by: 441371
Drawn by: 441371
FIO2: 30
FIO2: 30
FIO2: 30
O2 Saturation: 97.9 %
O2 Saturation: 98 %
O2 Saturation: 98.4 %
Patient temperature: 36.7
Patient temperature: 36.8
Patient temperature: 37.3
pCO2 arterial: 60.7 mmHg — ABNORMAL HIGH (ref 32.0–48.0)
pCO2 arterial: 55 mmHg — ABNORMAL HIGH (ref 32.0–48.0)
pCO2 arterial: 54.2 mmHg — ABNORMAL HIGH (ref 32.0–48.0)
pH, Arterial: 7.262 — ABNORMAL LOW (ref 7.350–7.450)
pH, Arterial: 7.3 — ABNORMAL LOW (ref 7.350–7.450)
pH, Arterial: 7.296 — ABNORMAL LOW (ref 7.350–7.450)
pO2, Arterial: 95.3 mmHg (ref 83.0–108.0)
pO2, Arterial: 95.4 mmHg (ref 83.0–108.0)
pO2, Arterial: 100 mmHg (ref 83.0–108.0)

## 2020-02-27 LAB — TRIGLYCERIDES, BODY FLUIDS: Triglycerides, Fluid: 26 mg/dL

## 2020-02-27 LAB — GLUCOSE, CAPILLARY
Glucose-Capillary: 85 mg/dL (ref 70–99)
Glucose-Capillary: 95 mg/dL (ref 70–99)
Glucose-Capillary: 74 mg/dL (ref 70–99)
Glucose-Capillary: 92 mg/dL (ref 70–99)
Glucose-Capillary: 105 mg/dL — ABNORMAL HIGH (ref 70–99)

## 2020-02-27 LAB — COMPREHENSIVE METABOLIC PANEL
ALT: 23 U/L (ref 0–44)
AST: 24 U/L (ref 15–41)
Albumin: 2.3 g/dL — ABNORMAL LOW (ref 3.5–5.0)
Alkaline Phosphatase: 188 U/L — ABNORMAL HIGH (ref 38–126)
Anion gap: 13 (ref 5–15)
BUN: 78 mg/dL — ABNORMAL HIGH (ref 8–23)
CO2: 27 mmol/L (ref 22–32)
Calcium: 9.3 mg/dL (ref 8.9–10.3)
Chloride: 94 mmol/L — ABNORMAL LOW (ref 98–111)
Creatinine, Ser: 2.08 mg/dL — ABNORMAL HIGH (ref 0.44–1.00)
GFR calc Af Amer: 27 mL/min — ABNORMAL LOW (ref >60)
GFR calc non Af Amer: 23 mL/min — ABNORMAL LOW (ref >60)
Glucose, Bld: 87 mg/dL (ref 70–99)
Potassium: 4 mmol/L (ref 3.5–5.1)
Sodium: 134 mmol/L — ABNORMAL LOW (ref 135–145)
Total Bilirubin: 0.7 mg/dL (ref 0.3–1.2)
Total Protein: 5.7 g/dL — ABNORMAL LOW (ref 6.5–8.1)

## 2020-02-27 LAB — MAGNESIUM: Magnesium: 2.5 mg/dL — ABNORMAL HIGH (ref 1.7–2.4)

## 2020-02-27 LAB — GLUCOSE, BODY FLUID OTHER: Glucose, Body Fluid Other: 129 mg/dL

## 2020-02-27 LAB — PHOSPHORUS: Phosphorus: 3.8 mg/dL (ref 2.5–4.6)

## 2020-02-27 MED ORDER — DEXTROSE 10 % IV SOLN: INTRAVENOUS | Status: DC

## 2020-02-27 NOTE — Progress Notes (Signed)
PROGRESS NOTE    Bailey Cox  ZOX:[id number] DOB: Mar 06, 1946 DOA: 01/05/2020 PCP: Patient, No Pcp Per   Chief Complaint  Patient presents with  . Abnormal Lab  . Leg Swelling    Brief Narrative:  74 year old female with history of cholecystitis status post cholecystectomy in 9/20 at [facility].  She was discharged to SNF but rehospitalized to [facility] and had bile duct stent.  Discharged back to SNF but readmitted with Ogilive syndrome.  Hospital course complicated by fluid overload and transferred to [facility] for dialysis.  At [facility], developed respiratory failure requiring vent support.  She underwent trach.she was eventually discharged to [facility] on 3/1 after prolonged hospitalization at [facility] but deemed too sick, and sent to [facility] due to fluid overload and acidosis.  Patient was initially admitted to ICU.  Required CRRT 3/9-3/12.  Transfer to floor on 3/14.  She pulled her trach.  Now on oxygen by nasal cannula.  She is on IHD via [place].  Has refused dialysis at times but deemed to lack capacity.  Family wants full scope of care including full code and dialysis.  She has been noncompliant with care and hemodialysis but lacks capacity. Ethics committee meeting held on 4/6.  The plan is to continue full scope of care per family's wish.  Finally she tolerated HD in recliner chair on 4/19 without sedating medications.  She will be discharged to SNF once she had outpatient HD spot and continues to tolerate HD on recliner chair.  Assessment & Plan:   Active Problems:   Shock ([place])   Renal failure   Tracheostomy status ([place])   Chronic hypoxemic respiratory failure (HCC)   Hypotension   Sepsis ([place])   Acute hypoxemic respiratory failure (HCC)   Acute kidney injury ([place])   Anemia due to stage 4 chronic kidney disease (HCC)   Abdominal pain   Goals of care, counseling/discussion   Depression, recurrent ([place])   Palliative care by specialist   Weakness   Pressure injury  of skin   Acute metabolic encephalopathy   Pleural effusion  Acute metabolic encephalopathy  Acute Hypercapneic Resp Failure  Respiratory Acidosis - lethargic.  Per family at bedside on 4/21, she's been like this since Saturday.  Multiple potential etiologies including azotemia, iatrogenic, delirium. No focal neuro deficits.  No obvious infectious signs.  Just completed CTX and azithromycin for aspiration pneumonia.  Ammonia slightly elevated.  TSH, B12 and RPR within normal.  Now with hypothermia unknown etiology.  -Continue lactulose 20 g TID daily for elevated ammonia - Blood gas yesterday showed respiratory acidosis -> improving slowly on bipap - once acidemia resolves, will need bipap nightly  - Follow UA and culture -> no growth, will d/c abx -Hold Seroquel and haldol -Discontinue oxycodone and trazodone -Reduce fentanyl -Frequent orientation and delirium precautions. -Hypothermia work-up as below.  MRSA/Pseudomonas aeruginosa pneumonia  Aspiration Pneumonia  Right Sided Effusion -previously completed antibiotic course for this. -CXR on 4/14 with RML, RLL and Left mid lung opacities concerning for multifocal pneumonia -Completed 5 days of CTX and azithromycin on 4/16. - CXR 4/21 with moderate to large R effusion (unchanged) with persistent dense airspace disease at the right middle lobe and R base as well as Marena Witts probable small L effusion with slightly improved aeration at the L base with residual atelectasis vs pneumonia at the L medial base - s/p thoracentesis 4/22 by pulmonology - 900 cc clear fluid out -> exudative per light's -> follow culture (no growth) and  pending cytoplogy - cxr 4/23 with persistent effusion -Aspiration precautions.  Hypothermia: Unclear etiology.  No fever or leukocytosis to suggest infectious process.  Temp borderline elevated 4/21, 99.7.  Hemodynamically stable. -Bear Hugger -Check TSH (wnl), urinalysis, urine culture (NG), blood culture (NGTD) and lactic  acid (wnl)  Acute kidney injury on chronic kidney disease, stage IV-likely due to ATN from sepsis -Noncompliant with HD but lacks capacity.  Continue HD in recliner. -Family to stay at bedside during HD for compliance.  Also using as needed Haldol for HD. -Nephrology managing  Acute on chronic hypoxic respiratory failure-likely due to aspiration pneumonia and fluid overload.  Saturating in mid 90s on 3 L by [place].  No respiratory distress.  Decannulated on 3/19. -Wean oxygen as able.  Minimal oxygen to keep saturation above 88%.  -Continue encouraging CPT.  DM-2 with hyperglycemia: A1c 5.7%. -Continue SSI-moderate every 4 hours -Continue NovoLog 2 units every 4 hours -Continue Lantus 8 units twice daily.  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  Iron sat 5%. -ESA and IV iron per nephrology -stable  Hypokalemia: Resolved. -Continue monitoring  Hyponatremia: Resolved.  Anxiety/depression/agitation -Continue Paxil and BuSpar.  Discontinue trazodone. -Frequent orientation and delirium precautions -As needed Haldol and scheduled Seroquel.   GERD/constipation -Continue PPI and bowel regimen  Mild thrombocytopenia: Stable -> resolved. -Continue monitoring  Generalized weakness/debility -PT/OT-not able to participate in therapy.  Requiring mechanical lift to chair.  Chronic pain: -Scheduled Tylenol 1000 mg 3 times daily -Discontinued oxycodone and reduced IV fentanyl due to AMS.  Goals of care/Ethical issues-complex medical issue with guarded prognosis. Patient refused HD previously but deemed to lack capacity by psych. Daughter wishes her to be full code with full scope of care including dialysis.  Continued on HD with the help of medications and family. -Palliative care following -Ethics involved previously.  Nutritional support/dysphagia -Appreciate  input by SLP-recommended dysphagia 2 diet. -Appreciate input by dietitian -Continue TF and dysphagia 2 diet Nutrition Problem: Inadequate oral intake Etiology: inability to eat  Signs/Symptoms: NPO status  Interventions: Tube feeding  Pressure Injury:  Pressure Injury 02/16/20 Coccyx Stage 1 -  Intact skin with non-blanchable redness of Lucien Budney localized area usually over S[place] Basic bony prominence. (Active)  02/16/20 1700  Location: Coccyx  Location Orientation:   Staging: Stage 1 -  Intact skin with non-blanchable redness of Ranay Ketter localized area usually over Venba Zenner bony prominence.  Wound Description (Comments):   Present on Admission:    DVT prophylaxis: SCD Code Status: full Family Communication: daughter over phone Disposition:   Status is: Inpatient  Remains inpatient appropriate because:Inpatient level of care appropriate due to severity of illness   Dispo: The patient is from: [facility]              Anticipated d/c is to: pending              Anticipated d/c date is: > 3 days              Patient currently is not medically stable to d/c.   Consultants:   PCCM  Nephrology  PMT  Procedures:   none  Antimicrobials: Anti-infectives (From admission, onward)   Start     Dose/Rate Route Frequency Ordered Stop   02/26/20 1300  cefTRIAXone (ROCEPHIN) 1 g in sodium chloride 0.9 % 100 mL IVPB  Status:  Discontinued     1 g 200 mL/hr over 30 Minutes  Intravenous Daily 02/26/20 1201 02/27/20 0930   02/16/20 1230  cefTRIAXone (ROCEPHIN) 2 g in sodium chloride 0.9 % 100 mL IVPB     2 g 200 mL/hr over 30 Minutes Intravenous Every 24 hours 02/16/20 1127 02/20/20 0941   02/16/20 1230  azithromycin (ZITHROMAX) 500 mg in sodium chloride 0.9 % 250 mL IVPB     500 mg 250 mL/hr over 60 Minutes Intravenous Every 24 hours 02/16/20 1127 02/20/20 1140   01/10/20 0930  vancomycin (VANCOCIN) IVPB 1000 mg/200 mL premix  Status:  Discontinued     1,000 mg 200 mL/hr over 60 Minutes Intravenous  Once  01/10/20 0915 01/13/20 0623   01/06/20 2200  ceFEPIme (MAXIPIME) 2 g in sodium chloride 0.9 % 100 mL IVPB     2 g 200 mL/hr over 30 Minutes Intravenous Every 24 hours 01/05/20 2251 01/11/20 2234   01/05/20 2200  ceFEPIme (MAXIPIME) 2 g in sodium chloride 0.9 % 100 mL IVPB     2 g 200 mL/hr over 30 Minutes Intravenous  Once 01/05/20 2148 01/06/20 0015   01/05/20 2200  metroNIDAZOLE (FLAGYL) IVPB 500 mg     500 mg 100 mL/hr over 60 Minutes Intravenous  Once 01/05/20 2148 01/06/20 0015   01/05/20 2200  vancomycin (VANCOCIN) IVPB 1000 mg/200 mL premix  Status:  Discontinued     1,000 mg 200 mL/hr over 60 Minutes Intravenous  Once 01/05/20 2148 01/05/20 2151   01/05/20 2200  vancomycin (VANCOREADY) IVPB 2000 mg/400 mL  Status:  Discontinued     2,000 mg 200 mL/hr over 120 Minutes Intravenous  Once 01/05/20 2151 01/05/20 2223      Subjective: Difficult to understand, but awakens and speaks on bipap  Objective: Vitals:   02/27/20 1129 02/27/20 1300 02/27/20 1547 02/27/20 1608  BP: (!) 116/43     Pulse: 75 79 78   Resp: 19 20 (!) 28 12  Temp: 98.3 F (36.8 C)     TempSrc: Axillary     SpO2: 100% 100% 100%   Weight:      Height:        Intake/Output Summary (Last 24 hours) at 02/27/2020 1618 Last data filed at 02/27/2020 1500 Gross per 24 hour  Intake 159.27 ml  Output --  Net 159.27 ml   Filed Weights   02/25/20 0500 02/26/20 0343 02/27/20 0418  Weight: 92.1 kg 92.4 kg 82.1 kg    Examination:  General: No acute distress. Cardiovascular: Heart sounds show Devereaux Grayson regular rate, and rhythm Lungs: comfortable on bipap Abdomen: Soft, nontender, nondistended  Neurological: awakens and speaks on bipap, difficult to understand. Moves all extremities 4. Cranial nerves II through XII grossly intact. Skin: Warm and dry. No rashes or lesions. Extremities: No clubbing or cyanosis. No edema.   Data Reviewed: I have personally reviewed following labs and imaging studies  CBC: Recent  Labs  Lab 02/22/20 0029 02/23/20 2334 02/25/20 1020 02/26/20 0401 02/27/20 0324  WBC 6.5 7.4 8.2 9.5 10.1  NEUTROABS  --   --   --   --  8.5*  HGB 7.6* 8.0* 7.5* 7.5* 7.5*  HCT 27.2* 29.1* 26.9* 26.6* 26.3*  MCV 96.8 97.3 97.8 96.0 94.6  PLT 106* 120* 138* 156 094    Basic Metabolic Panel: Recent Labs  Lab 02/21/20 1152 02/21/20 1152 02/22/20 0029 02/23/20 0318 02/25/20 1020 02/26/20 0401 02/27/20 0324  NA 133*   < > 132* 135 139 133* 134*  K 4.3   < > 4.6 4.2 3.7  3.9 4.0  CL 93*   < > 94* 96* 104 93* 94*  CO2 28   < > '25 27 26 29 27  '$ GLUCOSE 228*   < > 200* 108* 93 133* 87  BUN 71*   < > 87* 51* 25* 55* 78*  CREATININE 2.19*   < > 2.47* 1.67* 1.30* 1.67* 2.08*  CALCIUM 9.0   < > 9.1 9.3 8.6* 9.2 9.3  MG 2.2  --  2.3 2.0  --   --  2.5*  PHOS 4.1  --  4.2 3.2 3.0  --  3.8   < > = values in this interval not displayed.    GFR: Estimated Creatinine Clearance: 25.7 mL/min (Tegan Britain) (by C-G formula based on SCr of 2.08 mg/dL (H)).  Liver Function Tests: Recent Labs  Lab 02/23/20 0318 02/25/20 1020 02/26/20 0401 02/26/20 1710 02/27/20 0324  AST  --  24 26  --  24  ALT  --  17 26  --  23  ALKPHOS  --  54 241*  --  188*  BILITOT  --  1.7* 0.4  --  0.7  PROT  --  7.1 6.2* 6.3* 5.7*  ALBUMIN 2.5* 3.6 2.5* 2.4* 2.3*    CBG: Recent Labs  Lab 02/26/20 2106 02/26/20 2335 02/27/20 0416 02/27/20 0806 02/27/20 1136  GLUCAP 75 98 85 95 74     Recent Results (from the past 240 hour(s))  Culture, blood (routine x 2)     Status: None (Preliminary result)   Collection Time: 02/24/20 11:52 AM   Specimen: BLOOD  Result Value Ref Range Status   Specimen Description BLOOD RIGHT ANTECUBITAL  Final   Special Requests   Final    BOTTLES DRAWN AEROBIC ONLY Blood Culture results may not be optimal due to an inadequate volume of blood received in culture bottles   Culture   Final    NO GROWTH 3 DAYS Performed at [facility], [place] [address] [place], [place] [address]     Report Status PENDING  Incomplete  Culture, blood (routine x 2)     Status: None (Preliminary result)   Collection Time: 02/24/20 11:52 AM   Specimen: BLOOD  Result Value Ref Range Status   Specimen Description BLOOD LEFT ANTECUBITAL  Final   Special Requests   Final    BOTTLES DRAWN AEROBIC AND ANAEROBIC Blood Culture results may not be optimal due to an inadequate volume of blood received in culture bottles   Culture   Final    NO GROWTH 3 DAYS Performed at [facility], [place] [address] [place], [place] [address]    Report Status PENDING  Incomplete  Culture, Urine     Status: None   Collection Time: 02/25/20  4:00 PM   Specimen: Urine, Random  Result Value Ref Range Status   Specimen Description URINE, RANDOM  Final   Special Requests NONE  Final   Culture   Final    NO GROWTH Performed at [facility], [place] [address] [place], [place] [address]    Report Status 02/26/2020 FINAL  Final  Body fluid culture (includes gram stain)     Status: None (Preliminary result)   Collection Time: 02/26/20  4:44 PM   Specimen: Pleural Fluid  Result Value Ref Range Status   Specimen Description FLUID RIGHT PLEURAL  Final   Special Requests NONE  Final   Gram Stain NO ORGANISMS SEEN NO WBC SEEN   Final  Culture   Final    NO GROWTH < 24 HOURS Performed at [facility], [place] [address] [place], [place] [address]    Report Status PENDING  Incomplete         Radiology Studies: DG CHEST PORT 1 VIEW  Result Date: 02/27/2020 CLINICAL DATA:  Pleural effusion. EXAM: PORTABLE CHEST 1 VIEW COMPARISON:  02/26/2020 FINDINGS: The right IJ catheter is stable. The tip is in the right atrium. Stable mild cardiac enlargement. Mediastinal and hilar contours are stable. Persistent right effusion and overlying atelectasis. Persistent left basilar atelectasis. No new findings. IMPRESSION: Persistent right effusion and overlying atelectasis. Electronically Signed   By: Marijo Sanes  M.D.   On: 02/27/2020 07:49   DG CHEST PORT 1 VIEW  Result Date: 02/26/2020 CLINICAL DATA:  Followup pleural effusion. EXAM: PORTABLE CHEST 1 VIEW COMPARISON:  02/25/2020 and earlier studies. FINDINGS: Pleural fluid on the right appears mildly decreased. Linear opacities noted in the right mid lung consistent with atelectasis. Mild linear opacity noted at the left lung base, also likely atelectasis. Suspect small left pleural effusion. Mild vascular congestion and interstitial prominence is similar to the previous exam. Residual pleural fluid and lung base opacity on the right obscures most of the right heart border and the right hemidiaphragm. Right internal jugular dual-lumen central venous catheter is stable. No pneumothorax. IMPRESSION: 1. Mild decrease in the opacity at the right lung base consistent with Jailani Hogans decrease in right pleural fluid. 2. No other change. Electronically Signed   By: Lajean Manes M.D.   On: 02/26/2020 17:23        Scheduled Meds: . acetaminophen (TYLENOL) oral liquid 160 mg/5 mL  1,000 mg Per Tube Q8H  . bisacodyl  10 mg Rectal Daily  . busPIRone  5 mg Per Tube BID  . calcium acetate  667 mg Oral TID WC  . camphor-menthol   Topical QID  . chlorhexidine gluconate (MEDLINE KIT)  15 mL Mouth Rinse BID  . Chlorhexidine Gluconate Cloth  6 each Topical Q0600  . darbepoetin (ARANESP) injection - DIALYSIS  200 mcg Subcutaneous Q Wed-HD  . feeding supplement (NEPRO CARB STEADY)  1,000 mL Per Tube Q24H  . feeding supplement (PRO-STAT SUGAR FREE 64)  30 mL Per Tube TID  . insulin aspart  0-15 Units Subcutaneous Q4H  . insulin glargine  8 Units Subcutaneous BID  . lactulose  20 g Per Tube TID  . pantoprazole sodium  40 mg Per Tube BID  . PARoxetine  40 mg Per Tube Daily  . sodium chloride flush  10-40 mL Intracatheter Q12H   Continuous Infusions: . dextrose 40 mL/hr at 02/27/20 1500  . ferric gluconate (FERRLECIT/NULECIT) IV Stopped (02/25/20 1440)     LOS: 53 days     Time spent: over 30 min    Fayrene Helper, MD [facility]   To contact the attending provider between 7A-7P or the covering provider during after hours 7P-7A, please log into the web site [email or web address] and access using universal [place] password for that web site. If you do not have the password, please call the hospital operator.  02/27/2020, 4:18 PM

## 2020-02-27 NOTE — Progress Notes (Signed)
Renal Navigator spoke with Web designer at [facility] to discuss patient's situation and make verbal recommendation that she have her daughter present at OP HD to help support her and keep her calm due to dementia and agitation. Faxed documentation of patient's ESRD dx, requested by AA. Informed AA that patient no longer has a trach-one of her questions. Informed AA that patient is not medically stable at this time and is being worked up for an acute illness that has occurred over the past couple of days and also stated that patient has not been given a bed offer yet, but per conversation with CSW/C. Johnson, [facility] may be willing to consider when a female bed is available. Renal Navigator also spoke with [facility] Admissions Coordinator/Keri and will keep all parties up to date/follow up next week. Navigator received message from patient's daughter/Sandy that she is unsure if anyone will be able to be here with her mother this weekend because she has to work (as she has already stated to Navigator when she was here on Wednesday) and they have been asking to get patient's granddaughter added to visitation list in order for her to [place], they have not heard if this has been granted yet.  Alphonzo Cruise, House Renal Navigator [phone]

## 2020-02-27 NOTE — Progress Notes (Signed)
Spoke with attending and the care plan for the patient concerning Respiratory Therapy is to maintain the patient on the Bipap 4/23 night, phlebotomy will administer a venous gas in the am and the attending and RT will speak in the morning about any changes that may need to be made concerning the patient pH level. J Collier Monica RT

## 2020-02-27 NOTE — Progress Notes (Signed)
Admit: 01/05/2020 LOS: 5  34F prolonged AKI now ESRD  Subjective:  . req thoracentesis for parapneumonic effusion . Now req BiPAP overnight for hypercapneic RF . PaCO2 improving . CLIP completed yesterday  04/22 0701 - 04/23 0700 In: 100 [IV Piggyback:100] Out: -   Filed Weights   02/25/20 0500 02/26/20 0343 02/27/20 0418  Weight: 92.1 kg 92.4 kg 82.1 kg    Scheduled Meds: . acetaminophen (TYLENOL) oral liquid 160 mg/5 mL  1,000 mg Per Tube Q8H  . bisacodyl  10 mg Rectal Daily  . busPIRone  5 mg Per Tube BID  . calcium acetate  667 mg Oral TID WC  . camphor-menthol   Topical QID  . chlorhexidine gluconate (MEDLINE KIT)  15 mL Mouth Rinse BID  . Chlorhexidine Gluconate Cloth  6 each Topical Q0600  . darbepoetin (ARANESP) injection - DIALYSIS  200 mcg Subcutaneous Q Wed-HD  . feeding supplement (NEPRO CARB STEADY)  1,000 mL Per Tube Q24H  . feeding supplement (PRO-STAT SUGAR FREE 64)  30 mL Per Tube TID  . insulin aspart  0-15 Units Subcutaneous Q4H  . insulin glargine  8 Units Subcutaneous BID  . lactulose  20 g Per Tube TID  . pantoprazole sodium  40 mg Per Tube BID  . PARoxetine  40 mg Per Tube Daily  . sodium chloride flush  10-40 mL Intracatheter Q12H   Continuous Infusions: . dextrose 40 mL/hr at 02/27/20 1059  . ferric gluconate (FERRLECIT/NULECIT) IV Stopped (02/25/20 1440)   PRN Meds:.feeding supplement (NEPRO CARB STEADY), guaiFENesin, metoprolol tartrate, ondansetron (ZOFRAN) IV, phenol, promethazine, sodium chloride flush  Current Labs: reviewed    Physical Exam:  Blood pressure (!) 116/43, pulse 75, temperature 98.3 F (36.8 C), temperature source Axillary, resp. rate 19, height '5\' 4"'$  (1.626 m), weight 82.1 kg, SpO2 100 %. NAD Regular, nl s1s2 CTAB No sig LEE Trach site bandaged R IJ TDC  A 1. Dialysis dep't AKI now ESRD on THS schedule  1. CLIP completed MWF [place] MWF 2. Will be TDC until medically suitable for AVF/G 2. Debility; full  scope of care currenty; palliative working with patient/family 3. Difficulting with tolerating HD, now using recliner successfully 4. Anemia of CKD; 1. ESA qThur 286mg aranesp 2. TSAT 5% 4/18, start Fe 5. Hx/o PEA arrest 6. Hx/o trach, decannulated 7. Lacks capacity for medical decision making  8. Hypercapneic RF on BiPAP per CCM/TRH  P . HD Saturday so that family can be present: 3.5h/ 400/800, 2K 1-2L UF, TDC, no heparin; in recliner if off BiPAP . Hb downtrendig, recent inc to max ESA dosing; IV Fe started with HD 4/21 . Daily weights, Daily Renal Panel, Strict I/Os, Avoid nephrotoxins (NSAIDs, judicious IV Contrast)  . Medication Issues; o Preferred narcotic agents for pain control are hydromorphone, fentanyl, and methadone. Morphine should not be used.  o Baclofen should be avoided o Avoid oral sodium phosphate and magnesium citrate based laxatives / bowel preps    RPearson GrippeMD 02/27/2020, 12:04 PM  Recent Labs  Lab 02/23/20 0318 02/23/20 0318 02/25/20 1020 02/26/20 0401 02/27/20 0324  NA 135   < > 139 133* 134*  K 4.2   < > 3.7 3.9 4.0  CL 96*   < > 104 93* 94*  CO2 27   < > '26 29 27  '$ GLUCOSE 108*   < > 93 133* 87  BUN 51*   < > 25* 55* 78*  CREATININE 1.67*   < > 1.30*  1.67* 2.08*  CALCIUM 9.3   < > 8.6* 9.2 9.3  PHOS 3.2  --  3.0  --  3.8   < > = values in this interval not displayed.   Recent Labs  Lab 02/25/20 1020 02/26/20 0401 02/27/20 0324  WBC 8.2 9.5 10.1  NEUTROABS  --   --  8.5*  HGB 7.5* 7.5* 7.5*  HCT 26.9* 26.6* 26.3*  MCV 97.8 96.0 94.6  PLT 138* 156 204

## 2020-02-28 DIAGNOSIS — J9601 Acute respiratory failure with hypoxia: Secondary | ICD-10-CM | POA: Diagnosis not present

## 2020-02-28 DIAGNOSIS — J9602 Acute respiratory failure with hypercapnia: Secondary | ICD-10-CM | POA: Diagnosis not present

## 2020-02-28 LAB — COMPREHENSIVE METABOLIC PANEL
ALT: 25 U/L (ref 0–44)
AST: 26 U/L (ref 15–41)
Albumin: 2.2 g/dL — ABNORMAL LOW (ref 3.5–5.0)
Alkaline Phosphatase: 184 U/L — ABNORMAL HIGH (ref 38–126)
Anion gap: 15 (ref 5–15)
BUN: 90 mg/dL — ABNORMAL HIGH (ref 8–23)
CO2: 24 mmol/L (ref 22–32)
Calcium: 8.9 mg/dL (ref 8.9–10.3)
Chloride: 93 mmol/L — ABNORMAL LOW (ref 98–111)
Creatinine, Ser: 2.29 mg/dL — ABNORMAL HIGH (ref 0.44–1.00)
GFR calc Af Amer: 24 mL/min — ABNORMAL LOW (ref >60)
GFR calc non Af Amer: 21 mL/min — ABNORMAL LOW (ref >60)
Glucose, Bld: 135 mg/dL — ABNORMAL HIGH (ref 70–99)
Potassium: 3.3 mmol/L — ABNORMAL LOW (ref 3.5–5.1)
Sodium: 132 mmol/L — ABNORMAL LOW (ref 135–145)
Total Bilirubin: 0.5 mg/dL (ref 0.3–1.2)
Total Protein: 5.4 g/dL — ABNORMAL LOW (ref 6.5–8.1)

## 2020-02-28 LAB — BLOOD GAS, VENOUS
Acid-base deficit: 1.2 mmol/L (ref 0.0–2.0)
Bicarbonate: 25.4 mmol/L (ref 20.0–28.0)
FIO2: 21
O2 Saturation: 82.7 %
Patient temperature: 36.9
pCO2, Ven: 62.2 mmHg — ABNORMAL HIGH (ref 44.0–60.0)
pH, Ven: 7.234 — ABNORMAL LOW (ref 7.250–7.430)
pO2, Ven: 49.2 mmHg — ABNORMAL HIGH (ref 32.0–45.0)

## 2020-02-28 LAB — CBC WITH DIFFERENTIAL/PLATELET
Abs Immature Granulocytes: 0.2 10*3/uL — ABNORMAL HIGH (ref 0.00–0.07)
Basophils Absolute: 0 10*3/uL (ref 0.0–0.1)
Basophils Relative: 0 %
Eosinophils Absolute: 0 10*3/uL (ref 0.0–0.5)
Eosinophils Relative: 0 %
HCT: 27.2 % — ABNORMAL LOW (ref 36.0–46.0)
Hemoglobin: 7.9 g/dL — ABNORMAL LOW (ref 12.0–15.0)
Immature Granulocytes: 2 %
Lymphocytes Relative: 5 %
Lymphs Abs: 0.6 10*3/uL — ABNORMAL LOW (ref 0.7–4.0)
MCH: 27.2 pg (ref 26.0–34.0)
MCHC: 29 g/dL — ABNORMAL LOW (ref 30.0–36.0)
MCV: 93.8 fL (ref 80.0–100.0)
Monocytes Absolute: 0.7 10*3/uL (ref 0.1–1.0)
Monocytes Relative: 6 %
Neutro Abs: 10.7 10*3/uL — ABNORMAL HIGH (ref 1.7–7.7)
Neutrophils Relative %: 87 %
Platelets: 202 10*3/uL (ref 150–400)
RBC: 2.9 MIL/uL — ABNORMAL LOW (ref 3.87–5.11)
RDW: 17.2 % — ABNORMAL HIGH (ref 11.5–15.5)
WBC: 12.3 10*3/uL — ABNORMAL HIGH (ref 4.0–10.5)
nRBC: 0.5 % — ABNORMAL HIGH (ref 0.0–0.2)

## 2020-02-28 LAB — GLUCOSE, CAPILLARY
Glucose-Capillary: 110 mg/dL — ABNORMAL HIGH (ref 70–99)
Glucose-Capillary: 126 mg/dL — ABNORMAL HIGH (ref 70–99)
Glucose-Capillary: 99 mg/dL (ref 70–99)
Glucose-Capillary: 119 mg/dL — ABNORMAL HIGH (ref 70–99)
Glucose-Capillary: 67 mg/dL — ABNORMAL LOW (ref 70–99)
Glucose-Capillary: 129 mg/dL — ABNORMAL HIGH (ref 70–99)

## 2020-02-28 LAB — MAGNESIUM: Magnesium: 2.6 mg/dL — ABNORMAL HIGH (ref 1.7–2.4)

## 2020-02-28 LAB — PHOSPHORUS: Phosphorus: 4.4 mg/dL (ref 2.5–4.6)

## 2020-02-28 MED ORDER — SODIUM CHLORIDE 0.9 % IV SOLN
2.0000 g | INTRAVENOUS | Status: AC
  Administered 2020-02-29 – 2020-03-05 (×6): 2 g via INTRAVENOUS
  Filled 2020-02-28 (×4): qty 20
  Filled 2020-02-28 (×2): qty 2
  Filled 2020-02-28: qty 20

## 2020-02-28 MED ORDER — HEPARIN SODIUM (PORCINE) 1000 UNIT/ML IJ SOLN
INTRAMUSCULAR | Status: AC
  Administered 2020-02-28: 3800 [IU]
  Filled 2020-02-28: qty 4

## 2020-02-28 MED ORDER — MORPHINE SULFATE (PF) 2 MG/ML IV SOLN
1.0000 mg | Freq: Once | INTRAVENOUS | Status: AC
  Administered 2020-02-28: 1 mg via INTRAVENOUS
  Filled 2020-02-28: qty 1

## 2020-02-28 MED ORDER — DEXTROSE 50 % IV SOLN
INTRAVENOUS | Status: AC
  Administered 2020-02-28: 21:00:00 50 mL
  Filled 2020-02-28: qty 50

## 2020-02-28 NOTE — Progress Notes (Signed)
PROGRESS NOTE    Bailey Cox  QBH:[id number] DOB: [birth year] DOA: 01/05/2020 PCP: Patient, No Pcp Per   Chief Complaint  Patient presents with  . Abnormal Lab  . Leg Swelling    Brief Narrative:  74 year old female with history of cholecystitis status post cholecystectomy in 9/20 at [facility].  She was discharged to SNF but rehospitalized to [facility] and had bile duct stent.  Discharged back to SNF but readmitted with Ogilive syndrome.  Hospital course complicated by fluid overload and transferred to [facility] for dialysis.  At [facility], developed respiratory failure requiring vent support.  She underwent trach.she was eventually discharged to [facility] on 3/1 after prolonged hospitalization at [facility] but deemed too sick, and sent to [facility] due to fluid overload and acidosis.  Patient was initially admitted to ICU.  Required CRRT 3/9-3/12.  Transfer to floor on 3/14.  She pulled her trach.  Now on oxygen by nasal cannula.  She is on IHD via [place].  Has refused dialysis at times but deemed to lack capacity.  Family wants full scope of care including full code and dialysis.  She has been noncompliant with care and hemodialysis but lacks capacity. Ethics committee meeting held on 4/6.  The plan is to continue full scope of care per family's wish.  Finally she tolerated HD in recliner chair on 4/19 without sedating medications.  She will be discharged to SNF once she had outpatient HD spot and continues to tolerate HD on recliner chair.  Assessment & Plan:   Active Problems:   Shock ([place])   Renal failure   Tracheostomy status ([place])   Chronic hypoxemic respiratory failure (HCC)   Hypotension   Sepsis ([place])   Acute hypoxemic respiratory failure (HCC)   Acute kidney injury ([place])   Anemia due to stage 4 chronic kidney disease (HCC)   Abdominal pain   Goals of care, counseling/discussion   Depression, recurrent ([place])   Palliative care by specialist   Weakness   Pressure injury  of skin   Acute metabolic encephalopathy   Pleural effusion   Acute respiratory failure with hypercapnia (HCC)  Acute metabolic encephalopathy  Acute Hypercapneic Resp Failure  Respiratory Acidosis - . Per family at bedside on 4/21, she's been lethargic since Saturday.  This seems to have improved on bipap with her respiratory acidosis.  Multiple potential etiologies including azotemia, iatrogenic, delirium. No focal neuro deficits.  No obvious infectious signs.  Just completed CTX and azithromycin for aspiration pneumonia.  Ammonia slightly elevated.  TSH, B12 and RPR within normal.  Now with hypothermia unknown etiology.  -Continue lactulose 20 g TID daily for elevated ammonia - Persistent resp acidemia - her mental status appears to be improving, I think ok to come off bipap during the day now and continue nightly bipap (despite resp acidemia not completely resolved, will continue to closely monitor) - Follow UA and culture - no growth -Hold Seroquel and haldol -Discontinue oxycodone and trazodone -Reduce fentanyl -Frequent orientation and delirium precautions. -Hypothermia work-up as below.  MRSA/Pseudomonas aeruginosa pneumonia  Aspiration Pneumonia  Right Sided Effusion -previously completed antibiotic course for this. -CXR on 4/14 with RML, RLL and Left mid lung opacities concerning for multifocal pneumonia -Completed 5 days of CTX and azithromycin on 4/16. - CXR 4/21 with moderate to large R effusion (unchanged) with persistent dense airspace disease at the right middle lobe and R base as well as Divya Munshi probable small L effusion with slightly improved aeration at the L base  with residual atelectasis vs pneumonia at the L medial base - s/p thoracentesis 4/22 by pulmonology - 900 cc clear fluid out -> exudative per light's -> follow culture (no growth) and pending cytology - likely parapneumonic effusion -> will extend abx course x 7 days (discussed with pccm) - cxr 4/23 with persistent  effusion - Aspiration precautions.  Hypothermia: Unclear etiology.  No fever or leukocytosis to suggest infectious process.  Temp borderline elevated 4/21, 99.7.  Hemodynamically stable. -Bear Hugger -Check TSH (wnl), urinalysis, urine culture (NG), blood culture (NGTD) and lactic acid (wnl)  Acute kidney injury on chronic kidney disease, stage IV-likely due to ATN from sepsis -Noncompliant with HD but lacks capacity.  Continue HD in recliner. -Family to stay at bedside during HD for compliance.  Also using as needed Haldol for HD. -Nephrology managing  Acute on chronic hypoxic respiratory failure-likely due to aspiration pneumonia and fluid overload.  Saturating in mid 90s on 3 L by [place].  No respiratory distress.  Decannulated on 3/19. -Wean oxygen as able.  Minimal oxygen to keep saturation above 88%.  -Continue encouraging CPT.  DM-2 with hyperglycemia: A1c 5.7%. -Continue SSI-moderate every 4 hours -Continue NovoLog 2 units every 4 hours - on hold with need for bipap/holding tube feeds, will continue to monitor -Continue Lantus 8 units twice daily. -Continue D10 infusion until tube feeds restarted   Essential hypertension: Normotensive -Continue PRN metoprolol  Iron deficiency anemia with anemia of chronic kidney disease: Hgb 9.4 (admit)>>> 7.9.  Iron sat 5%. -ESA and IV iron per nephrology -stable  Hypokalemia: Resolved. -Continue monitoring - per renal in dialysis  Hyponatremia: Resolved.  Anxiety/depression/agitation -Continue Paxil and BuSpar.  Discontinue trazodone. -Frequent orientation and delirium precautions -As needed Haldol and scheduled Seroquel.   GERD/constipation -Continue PPI and bowel regimen  Mild thrombocytopenia: Stable -> resolved. -Continue monitoring  Generalized weakness/debility -PT/OT-not able to participate in therapy.  Requiring mechanical lift to chair.  Chronic pain: -Scheduled Tylenol 1000 mg 3 times daily -Discontinued  oxycodone and reduced IV fentanyl due to AMS.  Goals of care/Ethical issues-complex medical issue with guarded prognosis. Patient refused HD previously but deemed to lack capacity by psych. Daughter wishes her to be full code with full scope of care including dialysis.  Continued on HD with the help of medications and family. -Palliative care following -Ethics involved previously.  Nutritional support/dysphagia -Appreciate input by SLP-recommended dysphagia 2 diet. -Appreciate input by dietitian -Continue TF (resume today) and dysphagia 2 diet Nutrition Problem: Inadequate oral intake Etiology: inability to eat  Signs/Symptoms: NPO status  Interventions: Tube feeding  Pressure Injury:  Pressure Injury 02/16/20 Coccyx Stage 1 -  Intact skin with non-blanchable redness of Abas Leicht localized area usually over Gaylon Melchor bony prominence. (Active)  02/16/20 1700  Location: Coccyx  Location Orientation:   Staging: Stage 1 -  Intact skin with non-blanchable redness of Dahlila Pfahler localized area usually over Keita Demarco bony prominence.  Wound Description (Comments):   Present on Admission:    DVT prophylaxis: SCD Code Status: full Family Communication: daughter over phone Disposition:   Status is: Inpatient  Remains inpatient appropriate because:Inpatient level of care appropriate due to severity of illness   Dispo: The patient is from: [facility]              Anticipated d/c is to: pending              Anticipated d/c date is: > 3 days  Patient currently is not medically stable to d/c.   Consultants:   PCCM  Nephrology  PMT  Procedures:   none  Antimicrobials: Anti-infectives (From admission, onward)   Start     Dose/Rate Route Frequency Ordered Stop   02/28/20 1415  cefTRIAXone (ROCEPHIN) 2 g in sodium chloride 0.9 % 100 mL IVPB     2 g 200 mL/hr over 30 Minutes Intravenous Every 24 hours 02/28/20 1404     02/26/20 1300  cefTRIAXone (ROCEPHIN) 1 g in sodium chloride 0.9 % 100 mL IVPB   Status:  Discontinued     1 g 200 mL/hr over 30 Minutes Intravenous Daily 02/26/20 1201 02/27/20 0930   02/16/20 1230  cefTRIAXone (ROCEPHIN) 2 g in sodium chloride 0.9 % 100 mL IVPB     2 g 200 mL/hr over 30 Minutes Intravenous Every 24 hours 02/16/20 1127 02/20/20 0941   02/16/20 1230  azithromycin (ZITHROMAX) 500 mg in sodium chloride 0.9 % 250 mL IVPB     500 mg 250 mL/hr over 60 Minutes Intravenous Every 24 hours 02/16/20 1127 02/20/20 1140   01/10/20 0930  vancomycin (VANCOCIN) IVPB 1000 mg/200 mL premix  Status:  Discontinued     1,000 mg 200 mL/hr over 60 Minutes Intravenous  Once 01/10/20 0915 01/13/20 0623   01/06/20 2200  ceFEPIme (MAXIPIME) 2 g in sodium chloride 0.9 % 100 mL IVPB     2 g 200 mL/hr over 30 Minutes Intravenous Every 24 hours 01/05/20 2251 01/11/20 2234   01/05/20 2200  ceFEPIme (MAXIPIME) 2 g in sodium chloride 0.9 % 100 mL IVPB     2 g 200 mL/hr over 30 Minutes Intravenous  Once 01/05/20 2148 01/06/20 0015   01/05/20 2200  metroNIDAZOLE (FLAGYL) IVPB 500 mg     500 mg 100 mL/hr over 60 Minutes Intravenous  Once 01/05/20 2148 01/06/20 0015   01/05/20 2200  vancomycin (VANCOCIN) IVPB 1000 mg/200 mL premix  Status:  Discontinued     1,000 mg 200 mL/hr over 60 Minutes Intravenous  Once 01/05/20 2148 01/05/20 2151   01/05/20 2200  vancomycin (VANCOREADY) IVPB 2000 mg/400 mL  Status:  Discontinued     2,000 mg 200 mL/hr over 120 Minutes Intravenous  Once 01/05/20 2151 01/05/20 2223      Subjective: Awakens and speaks on bipap Confused, saying she doesn't want procedure (none currently planned)  Objective: Vitals:   02/28/20 0400 02/28/20 0538 02/28/20 0844 02/28/20 1142  BP: (!) 105/38   (!) 117/42  Pulse: 70 68 72 78  Resp: '12 17 19 19  '$ Temp: 98.3 F (36.8 C)   98.4 F (36.9 C)  TempSrc: Oral   Oral  SpO2: 100% 100% 100% 98%  Weight:  89.3 kg    Height:        Intake/Output Summary (Last 24 hours) at 02/28/2020 1405 Last data filed at 02/28/2020  0300 Gross per 24 hour  Intake 719.27 ml  Output --  Net 719.27 ml   Filed Weights   02/26/20 0343 02/27/20 0418 02/28/20 0538  Weight: 92.4 kg 82.1 kg 89.3 kg    Examination:  General: No acute distress. Cardiovascular: Heart sounds show Taylen Osorto regular rate, and rhythm.  Lungs: Clear to auscultation bilaterally  Abdomen: Soft, nontender, nondistended  Neurological: Alert, but disoriented. Moves all extremities 4. Cranial nerves II through XII grossly intact. Skin: Warm and dry. No rashes or lesions. Extremities: No clubbing or cyanosis. No edema.  Data Reviewed: I have personally reviewed following labs  and imaging studies  CBC: Recent Labs  Lab 02/23/20 2334 02/25/20 1020 02/26/20 0401 02/27/20 0324 02/28/20 0327  WBC 7.4 8.2 9.5 10.1 12.3*  NEUTROABS  --   --   --  8.5* 10.7*  HGB 8.0* 7.5* 7.5* 7.5* 7.9*  HCT 29.1* 26.9* 26.6* 26.3* 27.2*  MCV 97.3 97.8 96.0 94.6 93.8  PLT 120* 138* 156 204 938    Basic Metabolic Panel: Recent Labs  Lab 02/22/20 0029 02/22/20 0029 02/23/20 0318 02/25/20 1020 02/26/20 0401 02/27/20 0324 02/28/20 0327  NA 132*   < > 135 139 133* 134* 132*  K 4.6   < > 4.2 3.7 3.9 4.0 3.3*  CL 94*   < > 96* 104 93* 94* 93*  CO2 25   < > '27 26 29 27 24  '$ GLUCOSE 200*   < > 108* 93 133* 87 135*  BUN 87*   < > 51* 25* 55* 78* 90*  CREATININE 2.47*   < > 1.67* 1.30* 1.67* 2.08* 2.29*  CALCIUM 9.1   < > 9.3 8.6* 9.2 9.3 8.9  MG 2.3  --  2.0  --   --  2.5* 2.6*  PHOS 4.2  --  3.2 3.0  --  3.8 4.4   < > = values in this interval not displayed.    GFR: Estimated Creatinine Clearance: 24.4 mL/min (Brenlee Koskela) (by C-G formula based on SCr of 2.29 mg/dL (H)).  Liver Function Tests: Recent Labs  Lab 02/25/20 1020 02/26/20 0401 02/26/20 1710 02/27/20 0324 02/28/20 0327  AST 24 26  --  24 26  ALT 17 26  --  23 25  ALKPHOS 54 241*  --  188* 184*  BILITOT 1.7* 0.4  --  0.7 0.5  PROT 7.1 6.2* 6.3* 5.7* 5.4*  ALBUMIN 3.6 2.5* 2.4* 2.3* 2.2*     CBG: Recent Labs  Lab 02/27/20 2018 02/28/20 0027 02/28/20 0415 02/28/20 0818 02/28/20 1141  GLUCAP 105* 110* 126* 99 119*     Recent Results (from the past 240 hour(s))  Culture, blood (routine x 2)     Status: None (Preliminary result)   Collection Time: 02/24/20 11:52 AM   Specimen: BLOOD  Result Value Ref Range Status   Specimen Description BLOOD RIGHT ANTECUBITAL  Final   Special Requests   Final    BOTTLES DRAWN AEROBIC ONLY Blood Culture results may not be optimal due to an inadequate volume of blood received in culture bottles   Culture   Final    NO GROWTH 4 DAYS Performed at [facility], [place] [address], [place], [place] [address]    Report Status PENDING  Incomplete  Culture, blood (routine x 2)     Status: None (Preliminary result)   Collection Time: 02/24/20 11:52 AM   Specimen: BLOOD  Result Value Ref Range Status   Specimen Description BLOOD LEFT ANTECUBITAL  Final   Special Requests   Final    BOTTLES DRAWN AEROBIC AND ANAEROBIC Blood Culture results may not be optimal due to an inadequate volume of blood received in culture bottles   Culture   Final    NO GROWTH 4 DAYS Performed at [facility], [place] [address] [place], [place] [address]    Report Status PENDING  Incomplete  Culture, Urine     Status: None   Collection Time: 02/25/20  4:00 PM   Specimen: Urine, Random  Result Value Ref Range Status   Specimen Description URINE, RANDOM  Final  Special Requests NONE  Final   Culture   Final    NO GROWTH Performed at [facility], [place] [address] [place], [place] [address]    Report Status 02/26/2020 FINAL  Final  Body fluid culture (includes gram stain)     Status: None (Preliminary result)   Collection Time: 02/26/20  4:44 PM   Specimen: Pleural Fluid  Result Value Ref Range Status   Specimen Description FLUID RIGHT PLEURAL  Final   Special Requests NONE  Final   Gram Stain NO ORGANISMS SEEN NO WBC SEEN   Final    Culture   Final    NO GROWTH 2 DAYS Performed at [facility], [place] [address] [place], [place] [address]    Report Status PENDING  Incomplete         Radiology Studies: DG CHEST PORT 1 VIEW  Result Date: 02/27/2020 CLINICAL DATA:  Pleural effusion. EXAM: PORTABLE CHEST 1 VIEW COMPARISON:  02/26/2020 FINDINGS: The right IJ catheter is stable. The tip is in the right atrium. Stable mild cardiac enlargement. Mediastinal and hilar contours are stable. Persistent right effusion and overlying atelectasis. Persistent left basilar atelectasis. No new findings. IMPRESSION: Persistent right effusion and overlying atelectasis. Electronically Signed   By: Marijo Sanes M.D.   On: 02/27/2020 07:49   DG CHEST PORT 1 VIEW  Result Date: 02/26/2020 CLINICAL DATA:  Followup pleural effusion. EXAM: PORTABLE CHEST 1 VIEW COMPARISON:  02/25/2020 and earlier studies. FINDINGS: Pleural fluid on the right appears mildly decreased. Linear opacities noted in the right mid lung consistent with atelectasis. Mild linear opacity noted at the left lung base, also likely atelectasis. Suspect small left pleural effusion. Mild vascular congestion and interstitial prominence is similar to the previous exam. Residual pleural fluid and lung base opacity on the right obscures most of the right heart border and the right hemidiaphragm. Right internal jugular dual-lumen central venous catheter is stable. No pneumothorax. IMPRESSION: 1. Mild decrease in the opacity at the right lung base consistent with Jayen Bromwell decrease in right pleural fluid. 2. No other change. Electronically Signed   By: Lajean Manes M.D.   On: 02/26/2020 17:23        Scheduled Meds: . acetaminophen (TYLENOL) oral liquid 160 mg/5 mL  1,000 mg Per Tube Q8H  . bisacodyl  10 mg Rectal Daily  . busPIRone  5 mg Per Tube BID  . calcium acetate  667 mg Oral TID WC  . camphor-menthol   Topical QID  . chlorhexidine gluconate (MEDLINE KIT)  15 mL Mouth Rinse BID   . Chlorhexidine Gluconate Cloth  6 each Topical Q0600  . darbepoetin (ARANESP) injection - DIALYSIS  200 mcg Subcutaneous Q Wed-HD  . feeding supplement (NEPRO CARB STEADY)  1,000 mL Per Tube Q24H  . feeding supplement (PRO-STAT SUGAR FREE 64)  30 mL Per Tube TID  . insulin aspart  0-15 Units Subcutaneous Q4H  . insulin glargine  8 Units Subcutaneous BID  . lactulose  20 g Per Tube TID  . pantoprazole sodium  40 mg Per Tube BID  . PARoxetine  40 mg Per Tube Daily  . sodium chloride flush  10-40 mL Intracatheter Q12H   Continuous Infusions: . cefTRIAXone (ROCEPHIN)  IV    . dextrose 40 mL/hr at 02/27/20 1500  . ferric gluconate (FERRLECIT/NULECIT) IV Stopped (02/25/20 1440)     LOS: 54 days    Time spent: over 30 min    Fayrene Helper, MD [facility]  To contact the attending provider between 7A-7P or the covering provider during after hours 7P-7A, please log into the web site [email or web address] and access using universal [place] password for that web site. If you do not have the password, please call the hospital operator.  02/28/2020, 2:05 PM

## 2020-02-28 NOTE — Progress Notes (Signed)
Pt refused Bipap. Currently on HFNC 5L at 100%

## 2020-02-28 NOTE — Progress Notes (Addendum)
Patient takes BIPAP off x 2 and wants it off. V.S.S. Wants to wear N.C. for awhile. She is very alert and answers most questions right. R. N. aware

## 2020-02-28 NOTE — Progress Notes (Signed)
Bodenheimer N.P. return call and aware of the above and order 1 mg Morphine I.V. Due to sate stated C.P. was back  With no change in assessment. R.N.

## 2020-02-28 NOTE — Progress Notes (Signed)
Removed Bipap and changed to HFNC @ 6 liters.  Notified RT

## 2020-02-28 NOTE — Progress Notes (Signed)
Pt refused bipap

## 2020-02-28 NOTE — Progress Notes (Signed)
Admit: 01/05/2020 LOS: 11  48F prolonged AKI now ESRD  Subjective:  . Still on BiPAP, conversant . For HD today . Morphine overnight  04/23 0701 - 04/24 0700 In: 719.3 [I.V.:479.3] Out: -   Filed Weights   02/26/20 0343 02/27/20 0418 02/28/20 0538  Weight: 92.4 kg 82.1 kg 89.3 kg    Scheduled Meds: . acetaminophen (TYLENOL) oral liquid 160 mg/5 mL  1,000 mg Per Tube Q8H  . bisacodyl  10 mg Rectal Daily  . busPIRone  5 mg Per Tube BID  . calcium acetate  667 mg Oral TID WC  . camphor-menthol   Topical QID  . chlorhexidine gluconate (MEDLINE KIT)  15 mL Mouth Rinse BID  . Chlorhexidine Gluconate Cloth  6 each Topical Q0600  . darbepoetin (ARANESP) injection - DIALYSIS  200 mcg Subcutaneous Q Wed-HD  . feeding supplement (NEPRO CARB STEADY)  1,000 mL Per Tube Q24H  . feeding supplement (PRO-STAT SUGAR FREE 64)  30 mL Per Tube TID  . insulin aspart  0-15 Units Subcutaneous Q4H  . insulin glargine  8 Units Subcutaneous BID  . lactulose  20 g Per Tube TID  . pantoprazole sodium  40 mg Per Tube BID  . PARoxetine  40 mg Per Tube Daily  . sodium chloride flush  10-40 mL Intracatheter Q12H   Continuous Infusions: . dextrose 40 mL/hr at 02/27/20 1500  . ferric gluconate (FERRLECIT/NULECIT) IV Stopped (02/25/20 1440)   PRN Meds:.feeding supplement (NEPRO CARB STEADY), guaiFENesin, metoprolol tartrate, ondansetron (ZOFRAN) IV, phenol, promethazine, sodium chloride flush  Current Labs: reviewed    Physical Exam:  Blood pressure (!) 105/38, pulse 72, temperature 98.3 F (36.8 C), temperature source Oral, resp. rate 19, height _0  (1.626 m), weight 89.3 kg, SpO2 100 %. NAD Regular, nl s1s2 CTAB No sig LEE Trach site bandaged R IJ TDC  A 1. Dialysis dep't AKI now ESRD on THS schedule  1. CLIP completed MWF [place] MWF 2. Will be TDC until medically suitable for AVF/G 2. Debility; full scope of care currenty; palliative working with patient/family 3. Difficulting  with tolerating HD, now using recliner successfully 4. Anemia of CKD; 1. ESA qThur 246mg aranesp 2. TSAT 5% 4/18, started Fe 5. Hx/o PEA arrest 6. Hx/o trach, decannulated 7. Lacks capacity for medical decision making  8. 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 HD todayso that family can be present: 3.5h/ 400/800, 2K 1-2L UF, TDC, no heparin; in recliner if off BiPAP . Hb downtrendig, recent inc to max ESA dosing; IV Fe started with HD 4/21 . Transition to MWF HD next week, anticipating DC . Daily weights, Daily Renal Panel, Strict I/Os, Avoid nephrotoxins (NSAIDs, judicious IV Contrast)  . Medication Issues; o Preferred narcotic agents for pain control are hydromorphone, fentanyl, and methadone. Morphine should not be used.  o Baclofen should be avoided o Avoid oral sodium phosphate and magnesium citrate based laxatives / bowel preps    RPearson GrippeMD 02/28/2020, 10:54 AM  Recent Labs  Lab 02/25/20 1020 02/25/20 1020 02/26/20 0401 02/27/20 0324 02/28/20 0327  NA 139   < > 133* 134* 132*  K 3.7   < > 3.9 4.0 3.3*  CL 104   < > 93* 94* 93*  CO2 26   < > _1 GLUCOSE 93   < > 133* 87 135*  BUN 25*   < > 55* 78* 90*  CREATININE 1.30*   < >  1.67* 2.08* 2.29*  CALCIUM 8.6*   < > 9.2 9.3 8.9  PHOS 3.0  --   --  3.8 4.4   < > = values in this interval not displayed.   Recent Labs  Lab 02/26/20 0401 02/27/20 0324 02/28/20 0327  WBC 9.5 10.1 12.3*  NEUTROABS  --  8.5* 10.7*  HGB 7.5* 7.5* 7.9*  HCT 26.6* 26.3* 27.2*  MCV 96.0 94.6 93.8  PLT 156 204 202

## 2020-02-28 NOTE — Progress Notes (Signed)
Patient called out "Nurse" went to room and patient stated "I need to go to the E.R. Ask patient why. Patient stated"I am having C.P."  9/10 Patient is very relax points to Epigastric area.V.S.S. [name]. aware EKG done. Resp. Ther. Here to put patient back on BIPAP. Patient went to sleep . Awaken patient and she says she feels better. Paged Bodenheimer N.P. awaiting return call.

## 2020-02-29 DIAGNOSIS — J9600 Acute respiratory failure, unspecified whether with hypoxia or hypercapnia: Secondary | ICD-10-CM | POA: Diagnosis not present

## 2020-02-29 LAB — GLUCOSE, CAPILLARY
Glucose-Capillary: 104 mg/dL — ABNORMAL HIGH (ref 70–99)
Glucose-Capillary: 105 mg/dL — ABNORMAL HIGH (ref 70–99)
Glucose-Capillary: 130 mg/dL — ABNORMAL HIGH (ref 70–99)
Glucose-Capillary: 141 mg/dL — ABNORMAL HIGH (ref 70–99)
Glucose-Capillary: 144 mg/dL — ABNORMAL HIGH (ref 70–99)
Glucose-Capillary: 88 mg/dL (ref 70–99)
Glucose-Capillary: 96 mg/dL (ref 70–99)

## 2020-02-29 LAB — CBC WITH DIFFERENTIAL/PLATELET
Abs Immature Granulocytes: 0.14 10*3/uL — ABNORMAL HIGH (ref 0.00–0.07)
Basophils Absolute: 0.1 10*3/uL (ref 0.0–0.1)
Basophils Relative: 1 %
Eosinophils Absolute: 0.1 10*3/uL (ref 0.0–0.5)
Eosinophils Relative: 1 %
HCT: 27.5 % — ABNORMAL LOW (ref 36.0–46.0)
Hemoglobin: 7.9 g/dL — ABNORMAL LOW (ref 12.0–15.0)
Immature Granulocytes: 2 %
Lymphocytes Relative: 6 %
Lymphs Abs: 0.5 10*3/uL — ABNORMAL LOW (ref 0.7–4.0)
MCH: 26.5 pg (ref 26.0–34.0)
MCHC: 28.7 g/dL — ABNORMAL LOW (ref 30.0–36.0)
MCV: 92.3 fL (ref 80.0–100.0)
Monocytes Absolute: 0.8 10*3/uL (ref 0.1–1.0)
Monocytes Relative: 9 %
Neutro Abs: 7.4 10*3/uL (ref 1.7–7.7)
Neutrophils Relative %: 81 %
Platelets: 215 10*3/uL (ref 150–400)
RBC: 2.98 MIL/uL — ABNORMAL LOW (ref 3.87–5.11)
RDW: 17.2 % — ABNORMAL HIGH (ref 11.5–15.5)
WBC: 9 10*3/uL (ref 4.0–10.5)
nRBC: 0.7 % — ABNORMAL HIGH (ref 0.0–0.2)

## 2020-02-29 LAB — COMPREHENSIVE METABOLIC PANEL
ALT: 23 U/L (ref 0–44)
AST: 27 U/L (ref 15–41)
Albumin: 2.4 g/dL — ABNORMAL LOW (ref 3.5–5.0)
Alkaline Phosphatase: 168 U/L — ABNORMAL HIGH (ref 38–126)
Anion gap: 11 (ref 5–15)
BUN: 39 mg/dL — ABNORMAL HIGH (ref 8–23)
CO2: 28 mmol/L (ref 22–32)
Calcium: 8.3 mg/dL — ABNORMAL LOW (ref 8.9–10.3)
Chloride: 96 mmol/L — ABNORMAL LOW (ref 98–111)
Creatinine, Ser: 1.38 mg/dL — ABNORMAL HIGH (ref 0.44–1.00)
GFR calc Af Amer: 44 mL/min — ABNORMAL LOW (ref >60)
GFR calc non Af Amer: 38 mL/min — ABNORMAL LOW (ref >60)
Glucose, Bld: 115 mg/dL — ABNORMAL HIGH (ref 70–99)
Potassium: 3.7 mmol/L (ref 3.5–5.1)
Sodium: 135 mmol/L (ref 135–145)
Total Bilirubin: 0.3 mg/dL (ref 0.3–1.2)
Total Protein: 6 g/dL — ABNORMAL LOW (ref 6.5–8.1)

## 2020-02-29 LAB — PHOSPHORUS: Phosphorus: 3.2 mg/dL (ref 2.5–4.6)

## 2020-02-29 LAB — BLOOD GAS, VENOUS
Acid-Base Excess: 3.1 mmol/L — ABNORMAL HIGH (ref 0.0–2.0)
Bicarbonate: 29.5 mmol/L — ABNORMAL HIGH (ref 20.0–28.0)
FIO2: 40
O2 Saturation: 96.7 %
Patient temperature: 37
pCO2, Ven: 66.5 mmHg — ABNORMAL HIGH (ref 44.0–60.0)
pH, Ven: 7.269 (ref 7.250–7.430)
pO2, Ven: 81.1 mmHg — ABNORMAL HIGH (ref 32.0–45.0)

## 2020-02-29 LAB — CULTURE, BLOOD (ROUTINE X 2)
Culture: NO GROWTH
Culture: NO GROWTH

## 2020-02-29 LAB — BODY FLUID CULTURE
Culture: NO GROWTH
Gram Stain: NONE SEEN

## 2020-02-29 LAB — MAGNESIUM: Magnesium: 2.3 mg/dL (ref 1.7–2.4)

## 2020-02-29 NOTE — Progress Notes (Signed)
Admit: 01/05/2020 LOS: 40  64F prolonged AKI now ESRD  Subjective:  . Off BiPAP . No c/o . HD Yesterday 1.6L UF  04/24 0701 - 04/25 0700 In: 10 [P.O.:10] Out: 1623   Filed Weights   02/28/20 0538 02/28/20 1330 02/29/20 0351  Weight: 89.3 kg 89.3 kg 89.8 kg    Scheduled Meds: . acetaminophen (TYLENOL) oral liquid 160 mg/5 mL  1,000 mg Per Tube Q8H  . bisacodyl  10 mg Rectal Daily  . busPIRone  5 mg Per Tube BID  . calcium acetate  667 mg Oral TID WC  . camphor-menthol   Topical QID  . chlorhexidine gluconate (MEDLINE KIT)  15 mL Mouth Rinse BID  . Chlorhexidine Gluconate Cloth  6 each Topical Q0600  . darbepoetin (ARANESP) injection - DIALYSIS  200 mcg Subcutaneous Q Wed-HD  . feeding supplement (NEPRO CARB STEADY)  1,000 mL Per Tube Q24H  . feeding supplement (PRO-STAT SUGAR FREE 64)  30 mL Per Tube TID  . insulin aspart  0-15 Units Subcutaneous Q4H  . insulin glargine  8 Units Subcutaneous BID  . lactulose  20 g Per Tube TID  . pantoprazole sodium  40 mg Per Tube BID  . PARoxetine  40 mg Per Tube Daily  . sodium chloride flush  10-40 mL Intracatheter Q12H   Continuous Infusions: . cefTRIAXone (ROCEPHIN)  IV    . dextrose 40 mL/hr at 02/27/20 1500  . ferric gluconate (FERRLECIT/NULECIT) IV Stopped (02/28/20 1819)   PRN Meds:.feeding supplement (NEPRO CARB STEADY), guaiFENesin, metoprolol tartrate, ondansetron (ZOFRAN) IV, phenol, promethazine, sodium chloride flush  Current Labs: reviewed    Physical Exam:  Blood pressure (!) 153/54, pulse 85, temperature 98 F (36.7 C), temperature source Oral, resp. rate 16, height '5\' 4"'$  (1.626 m), weight 89.8 kg, SpO2 100 %. NAD Regular, nl s1s2 CTAB No sig LEE Trach site bandaged R IJ TDC  A 1. Dialysis dep't AKI now ESRD on THS schedule  1. CLIP completed MWF [place] MWF 2. Will be TDC until medically suitable for AVF/G  2. Debility; full scope of care currenty; palliative working with  patient/family 3. Difficulting with tolerating HD, now using recliner successfully 4. Anemia of CKD; 1. ESA qThur 240mg aranesp 2. TSAT 5% 4/18, started Fe 5. Hx/o PEA arrest 6. Hx/o trach, decannulated 7. Lacks capacity for medical decision making  8. 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 Move to MWF scheudle anticipating DC: 3.5h/ 400/800, 3KK 1-2L UF, TDC, no heparin; in recliner . Hb downtrendig, recent inc to max ESA dosing; IV Fe started with HD 4/21 . Daily weights, Daily Renal Panel, Strict I/Os, Avoid nephrotoxins (NSAIDs, judicious IV Contrast)  . Medication Issues; o Preferred narcotic agents for pain control are hydromorphone, fentanyl, and methadone. Morphine should not be used.  o Baclofen should be avoided o Avoid oral sodium phosphate and magnesium citrate based laxatives / bowel preps    RPearson GrippeMD 02/29/2020, 10:03 AM  Recent Labs  Lab 02/27/20 0324 02/28/20 0327 02/29/20 0351  NA 134* 132* 135  K 4.0 3.3* 3.7  CL 94* 93* 96*  CO2 '27 24 28  '$ GLUCOSE 87 135* 115*  BUN 78* 90* 39*  CREATININE 2.08* 2.29* 1.38*  CALCIUM 9.3 8.9 8.3*  PHOS 3.8 4.4 3.2   Recent Labs  Lab 02/27/20 0324 02/28/20 0327 02/29/20 0351  WBC 10.1 12.3* 9.0  NEUTROABS 8.5* 10.7* 7.4  HGB 7.5* 7.9* 7.9*  HCT 26.3* 27.2* 27.5*  MCV 94.6 93.8 92.3  PLT 204 202 215

## 2020-02-29 NOTE — TOC Progression Note (Signed)
Transition of Care [facility]) - Progression Note    Patient Details  Name: Bailey Cox MRN: [id number] Date of Birth: 1946-03-18  Transition of Care [facility]) CM/SW [place], [place] Phone Number: 02/29/2020, 11:53 AM  Clinical Narrative:      CSW tried to follow up on referral for SNF placement at [facility]. Representative told CSW to follow up tomorrow with Ramonita Lab the Admission Coordinator for facility to check status on referral. Ramonita Lab telephone number is [phone].  TOC team will continue to follow for SNF placement.  Expected Discharge Plan: Skilled Nursing Facility Barriers to Discharge: Continued Medical Work up, Other (comment)(ability to tolerate dialysis in recliner)  Expected Discharge Plan and Services Expected Discharge Plan: [place] In-house Referral: Clinical Social Work Discharge Planning Services: CM Consult Post Acute Care Choice: [place] Living arrangements for the past 2 months: (Has been hospitalized for over 4 months)                                       Social Determinants of Health (SDOH) Interventions    Readmission Risk Interventions Readmission Risk Prevention Plan 01/28/2020  Transportation Screening Complete  PCP or Specialist Appt within 3-5 Days Not Complete  Not Complete comments plan for SNF  HRI or [place] Not Complete  HRI or Home Care Consult comments plan for SNF  Social Work Consult for [place] Planning/Counseling Complete  Palliative Care Screening Complete  Medication Review Press photographer) Referral to Pharmacy  PCP or Specialist appointment within 3-5 days of discharge Not Complete  PCP/Specialist Appt Not Complete comments plan for SNF  HRI or Home Care Consult Complete  SW Recovery Care/Counseling Consult Complete  Palliative Care Screening Not Complete  Comments appropriate at this time  [place] Complete

## 2020-02-29 NOTE — Progress Notes (Signed)
PROGRESS NOTE    Bailey Cox  ZDG:[id number] DOB: 1945/12/11 DOA: 01/05/2020 PCP: Patient, No Pcp Per   Chief Complaint  Patient presents with  . Abnormal Lab  . Leg Swelling    Brief Narrative:  74 year old female with history of cholecystitis status post cholecystectomy in 9/20 at [facility].  She was discharged to SNF but rehospitalized to [facility] and had bile duct stent.  Discharged back to SNF but readmitted with Ogilive syndrome.  Hospital course complicated by fluid overload and transferred to [facility] for dialysis.  At [facility], developed respiratory failure requiring vent support.  She underwent trach.she was eventually discharged to [facility] on 3/1 after prolonged hospitalization at [facility] but deemed too sick, and sent to [facility] due to fluid overload and acidosis.  Patient was initially admitted to ICU.  Required CRRT 3/9-3/12.  Transfer to floor on 3/14.  She pulled her trach.  Now on oxygen by nasal cannula.  She is on IHD via [place].  Has refused dialysis at times but deemed to lack capacity.  Family wants full scope of care including full code and dialysis.  She has been noncompliant with care and hemodialysis but lacks capacity. Ethics committee meeting held on 4/6.  The plan is to continue full scope of care per family's wish.  Finally she tolerated HD in recliner chair on 4/19 without sedating medications.  She will be discharged to SNF once she had outpatient HD spot and continues to tolerate HD on recliner chair.  Assessment & Plan:   Active Problems:   Shock ([place])   Renal failure   Tracheostomy status ([place])   Chronic hypoxemic respiratory failure (HCC)   Hypotension   Sepsis ([place])   Acute hypoxemic respiratory failure (HCC)   Acute kidney injury ([place])   Anemia due to stage 4 chronic kidney disease (HCC)   Abdominal pain   Goals of care, counseling/discussion   Depression, recurrent ([place])   Palliative care by specialist   Weakness   Pressure injury  of skin   Acute metabolic encephalopathy   Pleural effusion   Acute respiratory failure with hypoxia and hypercapnia (HCC)  Acute metabolic encephalopathy  Acute Hypercapneic Resp Failure  Respiratory Acidosis - . Per family at bedside on 4/21, she's been lethargic since Saturday.  This seems to have improved on bipap with her respiratory acidosis.  Multiple potential etiologies including azotemia, iatrogenic, delirium. No focal neuro deficits.  No obvious infectious signs.  Just completed CTX and azithromycin for aspiration pneumonia.  Ammonia slightly elevated.  TSH, B12 and RPR within normal.  Now with hypothermia unknown etiology.  -Continue lactulose 20 g TID daily for elevated ammonia -acidemia improved, persistent resp acidosis -  her mental status appears to be improving, I think ok to come off bipap during the day now and continue nightly bipap  - Follow UA and culture - no growth -Hold Seroquel and haldol -Discontinue oxycodone and trazodone -Reduce fentanyl -Frequent orientation and delirium precautions. -Hypothermia work-up as below.  MRSA/Pseudomonas aeruginosa pneumonia  Aspiration Pneumonia  Right Sided Effusion -previously completed antibiotic course for this. -CXR on 4/14 with RML, RLL and Left mid lung opacities concerning for multifocal pneumonia -Completed 5 days of CTX and azithromycin on 4/16. - CXR 4/21 with moderate to large R effusion (unchanged) with persistent dense airspace disease at the right middle lobe and R base as well as Layla Gramm probable small L effusion with slightly improved aeration at the L base with residual atelectasis vs pneumonia at  the L medial base - s/p thoracentesis 4/22 by pulmonology - 900 cc clear fluid out -> exudative per light's -> follow culture (no growth) and pending cytology - likely parapneumonic effusion -> will extend abx course x 7 days (discussed with pccm) - cxr 4/23 with persistent effusion - Aspiration precautions.  Hypothermia:  Unclear etiology.  No fever or leukocytosis to suggest infectious process.  Temp borderline elevated 4/21, 99.7.  Hemodynamically stable. -Bear Hugger -Check TSH (wnl), urinalysis, urine culture (NG), blood culture (NGTD) and lactic acid (wnl)  Acute kidney injury on chronic kidney disease, stage IV-likely due to ATN from sepsis -Noncompliant with HD but lacks capacity.  Continue HD in recliner. -Family to stay at bedside during HD for compliance.  Also using as needed Haldol for HD. -Nephrology managing  Acute on chronic hypoxic respiratory failure-likely due to aspiration pneumonia and fluid overload.  Saturating in mid 90s on 3 L by [place].  No respiratory distress.  Decannulated on 3/19. -Wean oxygen as able.  Minimal oxygen to keep saturation above 88%.  -Continue encouraging CPT.  DM-2 with hyperglycemia: A1c 5.7%. -Continue SSI-moderate every 4 hours -Continue Lantus 8 units twice daily. -Continue D10 infusion until tube feeds restarted   Essential hypertension: Normotensive -Continue PRN metoprolol  Iron deficiency anemia with anemia of chronic kidney disease: Hgb 9.4 (admit)>>> 7.9.  Iron sat 5%. -ESA and IV iron per nephrology -stable  Hypokalemia: Resolved. -Continue monitoring - per renal in dialysis  Hyponatremia: Resolved.  Anxiety/depression/agitation -Continue Paxil and BuSpar.  Discontinue trazodone. -Frequent orientation and delirium precautions -As needed Haldol and scheduled Seroquel.   GERD/constipation -Continue PPI and bowel regimen  Mild thrombocytopenia: Stable -> resolved. -Continue monitoring  Generalized weakness/debility -PT/OT-not able to participate in therapy.  Requiring mechanical lift to chair.  Chronic pain: -Scheduled Tylenol 1000 mg 3 times daily -Discontinued oxycodone and reduced IV fentanyl due to AMS.  Goals of care/Ethical issues-complex medical issue with guarded prognosis. Patient refused HD previously but deemed to  lack capacity by psych. Daughter wishes her to be full code with full scope of care including dialysis.  Continued on HD with the help of medications and family. -Palliative care following -Ethics involved previously.  Nutritional support/dysphagia -Appreciate input by SLP-recommended dysphagia 2 diet. -Appreciate input by dietitian -Continue TF (resume today) and dysphagia 2 diet Nutrition Problem: Inadequate oral intake Etiology: inability to eat  Signs/Symptoms: NPO status  Interventions: Tube feeding  Pressure Injury:  Pressure Injury 02/16/20 Coccyx Stage 1 -  Intact skin with non-blanchable redness of Lyriq Jarchow localized area usually over Cheria Sadiq bony prominence. (Active)  02/16/20 1700  Location: Coccyx  Location Orientation:   Staging: Stage 1 -  Intact skin with non-blanchable redness of Trevione Wert localized area usually over Eulalah Rupert bony prominence.  Wound Description (Comments):   Present on Admission:    DVT prophylaxis: SCD Code Status: full Family Communication: daughter over phone Disposition:   Status is: Inpatient  Remains inpatient appropriate because:Inpatient level of care appropriate due to severity of illness   Dispo: The patient is from: [facility]              Anticipated d/c is to: pending              Anticipated d/c date is: > 3 days              Patient currently is not medically stable to d/c.   Consultants:   PCCM  Nephrology  PMT  Procedures:   none  Antimicrobials: **Note De-Identified vi Obfusction** Anti-infectives (From dmission, onwrd)   Strt     Dose/Rte Route Frequency Ordered Stop   02/28/20 1500  cefTRIAXone (ROCEPHIN) 2 g in sodium chloride 0.9 % 100 mL IVPB     2 g 200 mL/hr over 30 Minutes Intrvenous Every 24 hours 02/28/20 1404     02/26/20 1300  cefTRIAXone (ROCEPHIN) 1 g in sodium chloride 0.9 % 100 mL IVPB  Sttus:  Discontinued     1 g 200 mL/hr over 30 Minutes Intrvenous Dily 02/26/20 1201 02/27/20 0930   02/16/20 1230  cefTRIAXone (ROCEPHIN) 2 g in sodium chloride  0.9 % 100 mL IVPB     2 g 200 mL/hr over 30 Minutes Intrvenous Every 24 hours 02/16/20 1127 02/20/20 0941   02/16/20 1230  zithromycin (ZITHROMAX) 500 mg in sodium chloride 0.9 % 250 mL IVPB     500 mg 250 mL/hr over 60 Minutes Intrvenous Every 24 hours 02/16/20 1127 02/20/20 1140   01/10/20 0930  vncomycin (VANCOCIN) IVPB 1000 mg/200 mL premix  Sttus:  Discontinued     1,000 mg 200 mL/hr over 60 Minutes Intrvenous  Once 01/10/20 0915 01/13/20 0623   01/06/20 2200  ceFEPIme (MAXIPIME) 2 g in sodium chloride 0.9 % 100 mL IVPB     2 g 200 mL/hr over 30 Minutes Intrvenous Every 24 hours 01/05/20 2251 01/11/20 2234   01/05/20 2200  ceFEPIme (MAXIPIME) 2 g in sodium chloride 0.9 % 100 mL IVPB     2 g 200 mL/hr over 30 Minutes Intrvenous  Once 01/05/20 2148 01/06/20 0015   01/05/20 2200  metroNIDAZOLE (FLAGYL) IVPB 500 mg     500 mg 100 mL/hr over 60 Minutes Intrvenous  Once 01/05/20 2148 01/06/20 0015   01/05/20 2200  vncomycin (VANCOCIN) IVPB 1000 mg/200 mL premix  Sttus:  Discontinued     1,000 mg 200 mL/hr over 60 Minutes Intrvenous  Once 01/05/20 2148 01/05/20 2151   01/05/20 2200  vncomycin (VANCOREADY) IVPB 2000 mg/400 mL  Sttus:  Discontinued     2,000 mg 200 mL/hr over 120 Minutes Intrvenous  Once 01/05/20 2151 01/05/20 2223      Subjective: Awke, confused, sys she's t [facility] generlized discomfort ll over, but does not specify where  Objective: Vitls:   02/28/20 2135 02/29/20 0351 02/29/20 0738 02/29/20 1142  BP:  (!) 130/55 (!) 153/54 (!) 136/54  Pulse:  78 85 85  Resp:  (!) 25 16 (!) 21  Temp:  98.8 F (37.1 C) 98 F (36.7 C) 98.3 F (36.8 C)  TempSrc:  Orl Orl Orl  SpO2: 100% 100% 100% 98%  Weight:  89.8 kg    Height:        Intke/Output Summry (Lst 24 hours) t 02/29/2020 1415 Lst dt filed t 02/29/2020 1027 Gross per 24 hour  Intke 1352.67 ml  Output 1623 ml  Net -270.33 ml   Filed Weights   02/28/20 0538 02/28/20 1330  02/29/20 0351  Weight: 89.3 kg 89.3 kg 89.8 kg    Exmintion:  Generl: No cute distress. Crdiovsculr: Hert sounds show  regulr rte, nd rhythm. Lungs: Cler to usculttion bilterlly Abdomen: Soft, nontender, nondistended  Neurologicl: Alert nd oriented 3. Moves ll extremities 4. Crnil nerves II through XII grossly intct. Skin: Wrm nd dry. No rshes or lesions. Extremities: No clubbing or cynosis. No edem.   Dt Reviewed: I hve personlly reviewed following lbs nd imging studies  CBC: Recent Lbs  Lb 02/25/20 1020 02/26/20 0401 02/27/20 0324 02/28/20  0327 02/29/20 0351  WBC 8.2 9.5 10.1 12.3* 9.0  NEUTROABS  --   --  8.5* 10.7* 7.4  HGB 7.5* 7.5* 7.5* 7.9* 7.9*  HCT 26.9* 26.6* 26.3* 27.2* 27.5*  MCV 97.8 96.0 94.6 93.8 92.3  PLT 138* 156 204 202 924    Basic Metabolic Panel: Recent Labs  Lab 02/23/20 0318 02/23/20 0318 02/25/20 1020 02/26/20 0401 02/27/20 0324 02/28/20 0327 02/29/20 0351  NA 135   < > 139 133* 134* 132* 135  K 4.2   < > 3.7 3.9 4.0 3.3* 3.7  CL 96*   < > 104 93* 94* 93* 96*  CO2 27   < > _0 GLUCOSE 108*   < > 93 133* 87 135* 115*  BUN 51*   < > 25* 55* 78* 90* 39*  CREATININE 1.67*   < > 1.30* 1.67* 2.08* 2.29* 1.38*  CALCIUM 9.3   < > 8.6* 9.2 9.3 8.9 8.3*  MG 2.0  --   --   --  2.5* 2.6* 2.3  PHOS 3.2  --  3.0  --  3.8 4.4 3.2   < > = values in this interval not displayed.    GFR: Estimated Creatinine Clearance: 40.6 mL/min (Raivyn Kabler) (by C-G formula based on SCr of 1.38 mg/dL (H)).  Liver Function Tests: Recent Labs  Lab 02/25/20 1020 02/25/20 1020 02/26/20 0401 02/26/20 1710 02/27/20 0324 02/28/20 0327 02/29/20 0351  AST 24  --  26  --  _1 ALT 17  --  26  --  _2 ALKPHOS 54  --  241*  --  188* 184* 168*  BILITOT 1.7*  --  0.4  --  0.7 0.5 0.3  PROT 7.1   < > 6.2* 6.3* 5.7* 5.4* 6.0*  ALBUMIN 3.6   < > 2.5* 2.4* 2.3* 2.2* 2.4*   < > = values in this interval not displayed.      CBG: Recent Labs  Lab 02/28/20 2201 02/29/20 0003 02/29/20 0417 02/29/20 0736 02/29/20 1143  GLUCAP 129* 96 104* 144* 141*     Recent Results (from the past 240 hour(s))  Culture, blood (routine x 2)     Status: None   Collection Time: 02/24/20 11:52 AM   Specimen: BLOOD  Result Value Ref Range Status   Specimen Description BLOOD RIGHT ANTECUBITAL  Final   Special Requests   Final    BOTTLES DRAWN AEROBIC ONLY Blood Culture results may not be optimal due to an inadequate volume of blood received in culture bottles   Culture   Final    NO GROWTH 5 DAYS Performed at [facility], [place] [address] [place], [place] [address]    Report Status 02/29/2020 FINAL  Final  Culture, blood (routine x 2)     Status: None   Collection Time: 02/24/20 11:52 AM   Specimen: BLOOD  Result Value Ref Range Status   Specimen Description BLOOD LEFT ANTECUBITAL  Final   Special Requests   Final    BOTTLES DRAWN AEROBIC AND ANAEROBIC Blood Culture results may not be optimal due to an inadequate volume of blood received in culture bottles   Culture   Final    NO GROWTH 5 DAYS Performed at [facility], [place] [address], [place], [place] [address]    Report Status 02/29/2020 FINAL  Final  Culture, Urine     Status: None   Collection Time: 02/25/20  4:00 PM   Specimen: Urine, Random  Result Value Ref Range Status   Specimen Description URINE, RANDOM  Final   Special Requests NONE  Final   Culture   Final    NO GROWTH Performed at [facility], [address] [place], [place] [address]    Report Status 02/26/2020 FINAL  Final  Body fluid culture (includes gram stain)     Status: None   Collection Time: 02/26/20  4:44 PM   Specimen: Pleural Fluid  Result Value Ref Range Status   Specimen Description FLUID RIGHT PLEURAL  Final   Special Requests NONE  Final   Gram Stain NO ORGANISMS SEEN NO WBC SEEN   Final   Culture   Final    NO GROWTH 3 DAYS Performed at [facility], [place] [address] [place], [place] [address]    Report Status 02/29/2020 FINAL  Final         Radiology Studies: No results found.      Scheduled Meds: . acetaminophen (TYLENOL) oral liquid 160 mg/5 mL  1,000 mg Per Tube Q8H  . bisacodyl  10 mg Rectal Daily  . busPIRone  5 mg Per Tube BID  . calcium acetate  667 mg Oral TID WC  . camphor-menthol   Topical QID  . chlorhexidine gluconate (MEDLINE KIT)  15 mL Mouth Rinse BID  . Chlorhexidine Gluconate Cloth  6 each Topical Q0600  . darbepoetin (ARANESP) injection - DIALYSIS  200 mcg Subcutaneous Q Wed-HD  . feeding supplement (NEPRO CARB STEADY)  1,000 mL Per Tube Q24H  . feeding supplement (PRO-STAT SUGAR FREE 64)  30 mL Per Tube TID  . insulin aspart  0-15 Units Subcutaneous Q4H  . insulin glargine  8 Units Subcutaneous BID  . lactulose  20 g Per Tube TID  . pantoprazole sodium  40 mg Per Tube BID  . PARoxetine  40 mg Per Tube Daily  . sodium chloride flush  10-40 mL Intracatheter Q12H   Continuous Infusions: . cefTRIAXone (ROCEPHIN)  IV 2 g (02/29/20 1027)  . dextrose 40 mL/hr at 02/27/20 1500  . ferric gluconate (FERRLECIT/NULECIT) IV Stopped (02/28/20 1819)     LOS: 55 days    Time spent: over 71 min    Fayrene Helper, MD [facility]   To contact the attending provider between 7A-7P or the covering provider during after hours 7P-7A, please log into the web site [email or web address] and access using universal [place] password for that web site. If you do not have the password, please call the hospital operator.  02/29/2020, 2:15 PM

## 2020-03-01 DIAGNOSIS — G9341 Metabolic encephalopathy: Secondary | ICD-10-CM | POA: Diagnosis not present

## 2020-03-01 DIAGNOSIS — J90 Pleural effusion, not elsewhere classified: Secondary | ICD-10-CM | POA: Diagnosis not present

## 2020-03-01 DIAGNOSIS — R9389 Abnormal findings on diagnostic imaging of other specified body structures: Secondary | ICD-10-CM

## 2020-03-01 LAB — CBC WITH DIFFERENTIAL/PLATELET
Abs Immature Granulocytes: 0.09 10*3/uL — ABNORMAL HIGH (ref 0.00–0.07)
Basophils Absolute: 0 10*3/uL (ref 0.0–0.1)
Basophils Relative: 0 %
Eosinophils Absolute: 0.1 10*3/uL (ref 0.0–0.5)
Eosinophils Relative: 1 %
HCT: 29.2 % — ABNORMAL LOW (ref 36.0–46.0)
Hemoglobin: 8.2 g/dL — ABNORMAL LOW (ref 12.0–15.0)
Immature Granulocytes: 1 %
Lymphocytes Relative: 8 %
Lymphs Abs: 0.8 10*3/uL (ref 0.7–4.0)
MCH: 26.7 pg (ref 26.0–34.0)
MCHC: 28.1 g/dL — ABNORMAL LOW (ref 30.0–36.0)
MCV: 95.1 fL (ref 80.0–100.0)
Monocytes Absolute: 1 10*3/uL (ref 0.1–1.0)
Monocytes Relative: 9 %
Neutro Abs: 8.2 10*3/uL — ABNORMAL HIGH (ref 1.7–7.7)
Neutrophils Relative %: 81 %
Platelets: 188 10*3/uL (ref 150–400)
RBC: 3.07 MIL/uL — ABNORMAL LOW (ref 3.87–5.11)
RDW: 17.6 % — ABNORMAL HIGH (ref 11.5–15.5)
WBC: 10.2 10*3/uL (ref 4.0–10.5)
nRBC: 0.2 % (ref 0.0–0.2)

## 2020-03-01 LAB — COMPREHENSIVE METABOLIC PANEL
ALT: 22 U/L (ref 0–44)
AST: 23 U/L (ref 15–41)
Albumin: 2.4 g/dL — ABNORMAL LOW (ref 3.5–5.0)
Alkaline Phosphatase: 224 U/L — ABNORMAL HIGH (ref 38–126)
Anion gap: 11 (ref 5–15)
BUN: 61 mg/dL — ABNORMAL HIGH (ref 8–23)
CO2: 28 mmol/L (ref 22–32)
Calcium: 8.9 mg/dL (ref 8.9–10.3)
Chloride: 96 mmol/L — ABNORMAL LOW (ref 98–111)
Creatinine, Ser: 1.85 mg/dL — ABNORMAL HIGH (ref 0.44–1.00)
GFR calc Af Amer: 31 mL/min — ABNORMAL LOW (ref >60)
GFR calc non Af Amer: 27 mL/min — ABNORMAL LOW (ref >60)
Glucose, Bld: 163 mg/dL — ABNORMAL HIGH (ref 70–99)
Potassium: 3.6 mmol/L (ref 3.5–5.1)
Sodium: 135 mmol/L (ref 135–145)
Total Bilirubin: 0.6 mg/dL (ref 0.3–1.2)
Total Protein: 6.1 g/dL — ABNORMAL LOW (ref 6.5–8.1)

## 2020-03-01 LAB — GLUCOSE, CAPILLARY
Glucose-Capillary: 106 mg/dL — ABNORMAL HIGH (ref 70–99)
Glucose-Capillary: 114 mg/dL — ABNORMAL HIGH (ref 70–99)
Glucose-Capillary: 116 mg/dL — ABNORMAL HIGH (ref 70–99)
Glucose-Capillary: 123 mg/dL — ABNORMAL HIGH (ref 70–99)
Glucose-Capillary: 152 mg/dL — ABNORMAL HIGH (ref 70–99)
Glucose-Capillary: 153 mg/dL — ABNORMAL HIGH (ref 70–99)
Glucose-Capillary: 97 mg/dL (ref 70–99)

## 2020-03-01 LAB — PHOSPHORUS: Phosphorus: 5 mg/dL — ABNORMAL HIGH (ref 2.5–4.6)

## 2020-03-01 LAB — MAGNESIUM: Magnesium: 2.6 mg/dL — ABNORMAL HIGH (ref 1.7–2.4)

## 2020-03-01 LAB — CYTOLOGY - NON PAP

## 2020-03-01 MED ORDER — HEPARIN SODIUM (PORCINE) 1000 UNIT/ML IJ SOLN
INTRAMUSCULAR | Status: AC
  Filled 2020-03-01: qty 4

## 2020-03-01 NOTE — Progress Notes (Addendum)
Admit: 01/05/2020 LOS: 75  34F prolonged AKI now ESRD  Subjective:  On HD today on my rounds.  Last HD on 4/24 with 1.6 kg UF.  Seen and examined on HD at 12:12 pm today - procedure supervised.  Tolerating UF. RIJ tunneled catheter.  Didn't get the chair today for dialysis because one was not available.    Review of systems:  Limited by pt status Reports nausea Denies shortness of breath   04/25 0701 - 04/26 0700 In: 2683.7 [NG/GT:2102.7; IV Piggyback:101] Out: 0   Filed Weights   02/28/20 1330 02/29/20 0351 03/01/20 0433  Weight: 89.3 kg 89.8 kg 89.9 kg    Scheduled Meds: . acetaminophen (TYLENOL) oral liquid 160 mg/5 mL  1,000 mg Per Tube Q8H  . bisacodyl  10 mg Rectal Daily  . busPIRone  5 mg Per Tube BID  . calcium acetate  667 mg Oral TID WC  . camphor-menthol   Topical QID  . chlorhexidine gluconate (MEDLINE KIT)  15 mL Mouth Rinse BID  . Chlorhexidine Gluconate Cloth  6 each Topical Q0600  . darbepoetin (ARANESP) injection - DIALYSIS  200 mcg Subcutaneous Q Wed-HD  . feeding supplement (NEPRO CARB STEADY)  1,000 mL Per Tube Q24H  . feeding supplement (PRO-STAT SUGAR FREE 64)  30 mL Per Tube TID  . insulin aspart  0-15 Units Subcutaneous Q4H  . insulin glargine  8 Units Subcutaneous BID  . lactulose  20 g Per Tube TID  . pantoprazole sodium  40 mg Per Tube BID  . PARoxetine  40 mg Per Tube Daily  . sodium chloride flush  10-40 mL Intracatheter Q12H   Continuous Infusions: . cefTRIAXone (ROCEPHIN)  IV 2 g (03/01/20 1044)  . ferric gluconate (FERRLECIT/NULECIT) IV Stopped (02/28/20 1531)   PRN Meds:.feeding supplement (NEPRO CARB STEADY), guaiFENesin, metoprolol tartrate, ondansetron (ZOFRAN) IV, phenol, promethazine, sodium chloride flush  Current Labs: reviewed    Physical Exam:  Blood pressure (!) 138/59, pulse 84, temperature 98.1 F (36.7 C), temperature source Oral, resp. rate 20, height '5\' 4"'$  (1.626 m), weight 89.9 kg, SpO2 100 %. NAD  NCAT   s1s2 no  rub CTAB and unlabored  No sig LEE R IJ TDC  Assessment/Plan:  1. Dialysis dep't AKI now ESRD on THS schedule previously  1. CLIP completed MWF [place] MWF 2. Will be TDC until medically suitable for AVF/G  3. Moved to MWF schedule for HD.  HD in recliner.   4. Note Cr trends.  On HD today on my arrival.  Follow trends daily 2. Debility; full scope of care currently; palliative working with patient/family 3. Difficulting with tolerating HD, now using recliner successfully 4. Anemia of CKD 1. ESA qThur 258mg aranesp 2. TSAT 5% 4/18, started Fe 5. Hx/o PEA arrest - supportive care per primary  6. Hx/o trach, decannulated 7. Lacks capacity for medical decision making  8. Hypercapneic RF on BiPAP per CCM/TRH 9. CKD-BMD: trend phos -mildly elevated on current regimen     Recent Labs  Lab 02/28/20 0327 02/29/20 0351 03/01/20 0717  NA 132* 135 135  K 3.3* 3.7 3.6  CL 93* 96* 96*  CO2 '24 28 28  '$ GLUCOSE 135* 115* 163*  BUN 90* 39* 61*  CREATININE 2.29* 1.38* 1.85*  CALCIUM 8.9 8.3* 8.9  PHOS 4.4 3.2 5.0*   Recent Labs  Lab 02/28/20 0327 02/29/20 0351 03/01/20 0717  WBC 12.3* 9.0 10.2  NEUTROABS 10.7* 7.4 8.2*  HGB 7.9* 7.9* 8.2*  HCT  27.2* 27.5* 29.2*  MCV 93.8 92.3 95.1  PLT 202 215 188     Claudia Desanctis 03/01/2020 12:15 PM

## 2020-03-01 NOTE — Progress Notes (Signed)
RT Note:  Patient refused HS Bipap.  Encouraged and explained the benefit of using BIPAP while sleeping.  Patient still refused multiple times.  Resting comfortably on [place]. Weaned to 2L spo2  Currently at 98%

## 2020-03-01 NOTE — Progress Notes (Signed)
Renal Navigator provided medical update (according to most recent Attending progress note) to [facility] per their request. Navigator notes patient is not medically ready for discharge at this time and will continue to monitor closely.  Alphonzo Cruise, [place] Renal Navigator [phone]

## 2020-03-01 NOTE — Progress Notes (Signed)
PROGRESS NOTE    Bailey Cox  QMG:[id number] DOB: 01-03-1946 DOA: 01/05/2020 PCP: Patient, No Pcp Per   Chief Complaint  Patient presents with  . Abnormal Lab  . Leg Swelling    Brief Narrative:  74 year old female with history of cholecystitis status post cholecystectomy in 9/20 at [facility].  She was discharged to SNF but rehospitalized to [facility] and had bile duct stent.  Discharged back to SNF but readmitted with Ogilive syndrome.  Hospital course complicated by fluid overload and transferred to [facility] for dialysis.  At [facility], developed respiratory failure requiring vent support.  She underwent trach.she was eventually discharged to [facility] on 3/1 after prolonged hospitalization at [facility] but deemed too sick, and sent to [facility] due to fluid overload and acidosis.  Patient was initially admitted to ICU.  Required CRRT 3/9-3/12.  Transfer to floor on 3/14.  She pulled her trach.  Now on oxygen by nasal cannula.  She is on IHD via [place].  Has refused dialysis at times but deemed to lack capacity.  Family wants full scope of care including full code and dialysis.  She has been noncompliant with care and hemodialysis but lacks capacity. Ethics committee meeting held on 4/6.  The plan is to continue full scope of care per family's wish.  Finally she tolerated HD in recliner chair on 4/19 without sedating medications.  She will be discharged to SNF once she had outpatient HD spot and continues to tolerate HD on recliner chair.  Assessment & Plan:   Active Problems:   Shock ([place])   Renal failure   Tracheostomy status ([place])   Chronic hypoxemic respiratory failure (HCC)   Hypotension   Sepsis ([place])   Acute respiratory failure ([place])   Acute kidney injury ([place])   Anemia due to stage 4 chronic kidney disease (HCC)   Abdominal pain   Goals of care, counseling/discussion   Depression, recurrent ([place])   Palliative care by specialist   Weakness   Pressure injury of skin  Acute metabolic encephalopathy   Pleural effusion   Acute respiratory failure with hypoxia and hypercapnia (HCC)  Acute metabolic encephalopathy  Acute Hypercapneic Resp Failure  Respiratory Acidosis - . Per family at bedside on 4/21, she's been lethargic since Saturday.  This seems to have improved on bipap with her respiratory acidosis.  Multiple potential etiologies including azotemia, iatrogenic, delirium. No focal neuro deficits.  No obvious infectious signs.  Just completed CTX and azithromycin for aspiration pneumonia.  Ammonia slightly elevated.  TSH, B12 and RPR within normal.  Now with hypothermia unknown etiology.  -Continue lactulose 20 g TID daily for elevated ammonia -acidemia improved, persistent resp acidosis -  her mental status appears to be improving, I think ok to come off bipap during the day now and continue nightly bipap  - Follow UA and culture - no growth -Hold Seroquel and haldol -Discontinue oxycodone and trazodone -Reduce fentanyl -Frequent orientation and delirium precautions. -Hypothermia work-up as below.  MRSA/Pseudomonas aeruginosa pneumonia  Aspiration Pneumonia  Right Sided Effusion -previously completed antibiotic course for this. -CXR on 4/14 with RML, RLL and Left mid lung opacities concerning for multifocal pneumonia -Completed 5 days of CTX and azithromycin on 4/16. - CXR 4/21 with moderate to large R effusion (unchanged) with persistent dense airspace disease at the right middle lobe and R base as well as Paizlie Klaus probable small L effusion with slightly improved aeration at the L base with residual atelectasis vs pneumonia at the L medial  base - s/p thoracentesis 4/22 by pulmonology - 900 cc clear fluid out -> exudative per light's -> follow culture (no growth) and pending cytology - likely parapneumonic effusion -> will extend abx course x 7 days (discussed with pccm) - cxr 4/23 with persistent effusion - Aspiration precautions.  Hypothermia: Unclear  etiology.  No fever or leukocytosis to suggest infectious process.  Temp borderline elevated 4/21, 99.7.  Hemodynamically stable. -Bear Hugger -Check TSH (wnl), urinalysis, urine culture (NG), blood culture (NGTD) and lactic acid (wnl)  Acute kidney injury on chronic kidney disease, stage IV-likely due to ATN from sepsis -Noncompliant with HD but lacks capacity.  Continue HD in recliner. -Family to stay at bedside during HD for compliance.  Also using as needed Haldol for HD. -Nephrology managing  Acute on chronic hypoxic respiratory failure-likely due to aspiration pneumonia and fluid overload.  Saturating in mid 90s on 3 L by [place].  No respiratory distress.  Decannulated on 3/19. -Wean oxygen as able.  Minimal oxygen to keep saturation above 88%.  -Continue encouraging CPT.  DM-2 with hyperglycemia: A1c 5.7%. -Continue SSI-moderate every 4 hours -Continue Lantus 8 units twice daily.  Essential hypertension: Normotensive -Continue PRN metoprolol  Iron deficiency anemia with anemia of chronic kidney disease: Hgb 9.4 (admit)>>> 7.9.  Iron sat 5%. -ESA and IV iron per nephrology -stable  Hypokalemia: Resolved. -Continue monitoring - per renal in dialysis  Hyponatremia: Resolved.  Anxiety/depression/agitation -Continue Paxil and BuSpar.  Discontinue trazodone. -Frequent orientation and delirium precautions -As needed Haldol and scheduled Seroquel.   GERD/constipation -Continue PPI and bowel regimen  Mild thrombocytopenia: Stable -> resolved. -Continue monitoring  Generalized weakness/debility -PT/OT-not able to participate in therapy.  Requiring mechanical lift to chair.  Chronic pain: -Scheduled Tylenol 1000 mg 3 times daily -Discontinued oxycodone and reduced IV fentanyl due to AMS.  Goals of care/Ethical issues-complex medical issue with guarded prognosis. Patient refused HD previously but deemed to lack capacity by psych. Daughter wishes her to be full code  with full scope of care including dialysis.  Continued on HD with the help of medications and family. -Palliative care following -Ethics involved previously.  Nutritional support/dysphagia -Appreciate input by SLP-recommended dysphagia 2 diet. -Appreciate input by dietitian -Continue TF (resume today) and dysphagia 2 diet Nutrition Problem: Inadequate oral intake Etiology: inability to eat  Signs/Symptoms: NPO status  Interventions: Tube feeding  Pressure Injury:  Pressure Injury 02/16/20 Coccyx Stage 1 -  Intact skin with non-blanchable redness of Bailey Cox localized area usually over Ikechukwu Cerny bony prominence. (Active)  02/16/20 1700  Location: Coccyx  Location Orientation:   Staging: Stage 1 -  Intact skin with non-blanchable redness of Rana Hochstein localized area usually over Gaspard Isbell bony prominence.  Wound Description (Comments):   Present on Admission:    DVT prophylaxis: SCD Code Status: full Family Communication: daughter over phone Disposition:   Status is: Inpatient  Remains inpatient appropriate because:Inpatient level of care appropriate due to severity of illness   Dispo: The patient is from: [facility]              Anticipated d/c is to: pending              Anticipated d/c date is: > 3 days              Patient currently is not medically stable to d/c.   Consultants:   PCCM  Nephrology  PMT  Procedures:   none  Antimicrobials: Anti-infectives (From admission, onward)   Start  Dose/Rate Route Frequency Ordered Stop   02/28/20 1500  cefTRIAXone (ROCEPHIN) 2 g in sodium chloride 0.9 % 100 mL IVPB     2 g 200 mL/hr over 30 Minutes Intravenous Every 24 hours 02/28/20 1404 03/05/20 2359   02/26/20 1300  cefTRIAXone (ROCEPHIN) 1 g in sodium chloride 0.9 % 100 mL IVPB  Status:  Discontinued     1 g 200 mL/hr over 30 Minutes Intravenous Daily 02/26/20 1201 02/27/20 0930   02/16/20 1230  cefTRIAXone (ROCEPHIN) 2 g in sodium chloride 0.9 % 100 mL IVPB     2 g 200 mL/hr over 30  Minutes Intravenous Every 24 hours 02/16/20 1127 02/20/20 0941   02/16/20 1230  azithromycin (ZITHROMAX) 500 mg in sodium chloride 0.9 % 250 mL IVPB     500 mg 250 mL/hr over 60 Minutes Intravenous Every 24 hours 02/16/20 1127 02/20/20 1140   01/10/20 0930  vancomycin (VANCOCIN) IVPB 1000 mg/200 mL premix  Status:  Discontinued     1,000 mg 200 mL/hr over 60 Minutes Intravenous  Once 01/10/20 0915 01/13/20 0623   01/06/20 2200  ceFEPIme (MAXIPIME) 2 g in sodium chloride 0.9 % 100 mL IVPB     2 g 200 mL/hr over 30 Minutes Intravenous Every 24 hours 01/05/20 2251 01/11/20 2234   01/05/20 2200  ceFEPIme (MAXIPIME) 2 g in sodium chloride 0.9 % 100 mL IVPB     2 g 200 mL/hr over 30 Minutes Intravenous  Once 01/05/20 2148 01/06/20 0015   01/05/20 2200  metroNIDAZOLE (FLAGYL) IVPB 500 mg     500 mg 100 mL/hr over 60 Minutes Intravenous  Once 01/05/20 2148 01/06/20 0015   01/05/20 2200  vancomycin (VANCOCIN) IVPB 1000 mg/200 mL premix  Status:  Discontinued     1,000 mg 200 mL/hr over 60 Minutes Intravenous  Once 01/05/20 2148 01/05/20 2151   01/05/20 2200  vancomycin (VANCOREADY) IVPB 2000 mg/400 mL  Status:  Discontinued     2,000 mg 200 mL/hr over 120 Minutes Intravenous  Once 01/05/20 2151 01/05/20 2223      Subjective: No complaints "feeling better" Confused, but awakens to voice and answers questions appropriately  Objective: Vitals:   02/29/20 2327 03/01/20 0433 03/01/20 0729 03/01/20 1130  BP: (!) 140/58 (!) 135/50 (!) 110/46 (!) 138/59  Pulse: 81 76 78 84  Resp: _0 Temp: 98.8 F (37.1 C) 98.7 F (37.1 C) 98.3 F (36.8 C) 98.1 F (36.7 C)  TempSrc: Oral Oral Oral Oral  SpO2: 100% 99% 100% 100%  Weight:  89.9 kg    Height:        Intake/Output Summary (Last 24 hours) at 03/01/2020 1138 Last data filed at 03/01/2020 0300 Gross per 24 hour  Intake 1241.01 ml  Output 0 ml  Net 1241.01 ml   Filed Weights   02/28/20 1330 02/29/20 0351 03/01/20 0433  Weight:  89.3 kg 89.8 kg 89.9 kg    Examination:  General: No acute distress. Cardiovascular: Heart sounds show Laithan Conchas regular rate, and rhythm. Lungs: Clear to auscultation bilaterally Abdomen: Soft, nontender, nondistended w Neurological: Alert and disoriented. Moves all extremities 4. Cranial nerves II through XII grossly intact. Skin: Warm and dry. No rashes or lesions. Extremities: No clubbing or cyanosis. No edema.   Data Reviewed: I have personally reviewed following labs and imaging studies  CBC: Recent Labs  Lab 02/26/20 0401 02/27/20 0324 02/28/20 0327 02/29/20 0351 03/01/20 0717  WBC 9.5 10.1 12.3* 9.0 10.2  NEUTROABS  --  8.5* 10.7* 7.4 8.2*  HGB 7.5* 7.5* 7.9* 7.9* 8.2*  HCT 26.6* 26.3* 27.2* 27.5* 29.2*  MCV 96.0 94.6 93.8 92.3 95.1  PLT 156 204 202 215 027    Basic Metabolic Panel: Recent Labs  Lab 02/25/20 1020 02/25/20 1020 02/26/20 0401 02/27/20 0324 02/28/20 0327 02/29/20 0351 03/01/20 0717  NA 139   < > 133* 134* 132* 135 135  K 3.7   < > 3.9 4.0 3.3* 3.7 3.6  CL 104   < > 93* 94* 93* 96* 96*  CO2 26   < > _0 GLUCOSE 93   < > 133* 87 135* 115* 163*  BUN 25*   < > 55* 78* 90* 39* 61*  CREATININE 1.30*   < > 1.67* 2.08* 2.29* 1.38* 1.85*  CALCIUM 8.6*   < > 9.2 9.3 8.9 8.3* 8.9  MG  --   --   --  2.5* 2.6* 2.3 2.6*  PHOS 3.0  --   --  3.8 4.4 3.2 5.0*   < > = values in this interval not displayed.    GFR: Estimated Creatinine Clearance: 30.3 mL/min (Bently Morath) (by C-G formula based on SCr of 1.85 mg/dL (H)).  Liver Function Tests: Recent Labs  Lab 02/26/20 0401 02/26/20 0401 02/26/20 1710 02/27/20 0324 02/28/20 0327 02/29/20 0351 03/01/20 0717  AST 26  --   --  _1 ALT 26  --   --  _2 ALKPHOS 241*  --   --  188* 184* 168* 224*  BILITOT 0.4  --   --  0.7 0.5 0.3 0.6  PROT 6.2*   < > 6.3* 5.7* 5.4* 6.0* 6.1*  ALBUMIN 2.5*   < > 2.4* 2.3* 2.2* 2.4* 2.4*   < > = values in this interval not displayed.    CBG: Recent  Labs  Lab 02/29/20 1526 02/29/20 1953 03/01/20 0010 03/01/20 0435 03/01/20 0745  GLUCAP 130* 105* 116* 123* 152*     Recent Results (from the past 240 hour(s))  Culture, blood (routine x 2)     Status: None   Collection Time: 02/24/20 11:52 AM   Specimen: BLOOD  Result Value Ref Range Status   Specimen Description BLOOD RIGHT ANTECUBITAL  Final   Special Requests   Final    BOTTLES DRAWN AEROBIC ONLY Blood Culture results may not be optimal due to an inadequate volume of blood received in culture bottles   Culture   Final    NO GROWTH 5 DAYS Performed at [facility], [place] [address] [place], [place] [address]    Report Status 02/29/2020 FINAL  Final  Culture, blood (routine x 2)     Status: None   Collection Time: 02/24/20 11:52 AM   Specimen: BLOOD  Result Value Ref Range Status   Specimen Description BLOOD LEFT ANTECUBITAL  Final   Special Requests   Final    BOTTLES DRAWN AEROBIC AND ANAEROBIC Blood Culture results may not be optimal due to an inadequate volume of blood received in culture bottles   Culture   Final    NO GROWTH 5 DAYS Performed at [facility], [place] [address], [place], [place] [address]    Report Status 02/29/2020 FINAL  Final  Culture, Urine     Status: None   Collection Time: 02/25/20  4:00 PM   Specimen: Urine, Random  Result Value Ref Range Status   Specimen Description  URINE, RANDOM  Final   Special Requests NONE  Final   Culture   Final    NO GROWTH Performed at [facility], [place] [address], [place], [place] [address]    Report Status 02/26/2020 FINAL  Final  Body fluid culture (includes gram stain)     Status: None   Collection Time: 02/26/20  4:44 PM   Specimen: Pleural Fluid  Result Value Ref Range Status   Specimen Description FLUID RIGHT PLEURAL  Final   Special Requests NONE  Final   Gram Stain NO ORGANISMS SEEN NO WBC SEEN   Final   Culture   Final    NO GROWTH 3 DAYS Performed at [facility],  [place] [address] [place], [place] [address]    Report Status 02/29/2020 FINAL  Final         Radiology Studies: No results found.      Scheduled Meds: . acetaminophen (TYLENOL) oral liquid 160 mg/5 mL  1,000 mg Per Tube Q8H  . bisacodyl  10 mg Rectal Daily  . busPIRone  5 mg Per Tube BID  . calcium acetate  667 mg Oral TID WC  . camphor-menthol   Topical QID  . chlorhexidine gluconate (MEDLINE KIT)  15 mL Mouth Rinse BID  . Chlorhexidine Gluconate Cloth  6 each Topical Q0600  . darbepoetin (ARANESP) injection - DIALYSIS  200 mcg Subcutaneous Q Wed-HD  . feeding supplement (NEPRO CARB STEADY)  1,000 mL Per Tube Q24H  . feeding supplement (PRO-STAT SUGAR FREE 64)  30 mL Per Tube TID  . insulin aspart  0-15 Units Subcutaneous Q4H  . insulin glargine  8 Units Subcutaneous BID  . lactulose  20 g Per Tube TID  . pantoprazole sodium  40 mg Per Tube BID  . PARoxetine  40 mg Per Tube Daily  . sodium chloride flush  10-40 mL Intracatheter Q12H   Continuous Infusions: . cefTRIAXone (ROCEPHIN)  IV 2 g (03/01/20 1044)  . ferric gluconate (FERRLECIT/NULECIT) IV Stopped (02/28/20 1531)     LOS: 56 days    Time spent: over 30 min    Fayrene Helper, MD [facility]   To contact the attending provider between 7A-7P or the covering provider during after hours 7P-7A, please log into the web site [email or web address] and access using universal [place] password for that web site. If you do not have the password, please call the hospital operator.  03/01/2020, 11:38 AM

## 2020-03-01 NOTE — TOC Progression Note (Signed)
Transition of Care [facility]) - Progression Note    Patient Details  Name: Bailey Cox MRN: [id number] Date of Birth: 07-08-1946  Transition of Care [facility]) CM/SW [place], [place] Phone Number: 03/01/2020, 5:08 PM  Clinical Narrative:    SW reviewed "[facility]" and "[place]" both are ALFs. CSW called and left voicemails for admissions at [facility], [place], and [facility]. Pt family requested no referral to [facility] or [facility] at this time   Expected Discharge Plan: [place] Barriers to Discharge: Continued Medical Work up, Other (comment)(ability to tolerate dialysis in recliner)  Expected Discharge Plan and Services Expected Discharge Plan: [place] In-house Referral: Clinical Social Work Discharge Planning Services: CM Consult Post Acute Care Choice: [place] Living arrangements for the past 2 months: (Has been hospitalized for over 4 months)   Readmission Risk Interventions Readmission Risk Prevention Plan 01/28/2020  Transportation Screening Complete  PCP or Specialist Appt within 3-5 Days Not Complete  Not Complete comments plan for SNF  HRI or [place] Not Complete  HRI or Home Care Consult comments plan for SNF  Social Work Consult for [place] Planning/Counseling Complete  Palliative Care Screening Complete  Medication Review Press photographer) Referral to Pharmacy  PCP or Specialist appointment within 3-5 days of discharge Not Complete  PCP/Specialist Appt Not Complete comments plan for SNF  HRI or Home Care Consult Complete  SW Recovery Care/Counseling Consult Complete  Palliative Care Screening Not Complete  Comments appropriate at this time  [place] Complete

## 2020-03-01 NOTE — TOC Progression Note (Signed)
Transition of Care [facility]) - Progression Note    Patient Details  Name: Kahlia Lagunes MRN: [id number] Date of Birth: Jun 29, 1946  Transition of Care [facility]) CM/SW [place], [place] Phone Number: 03/01/2020, 3:22 PM  Clinical Narrative:     03/01/20-CSW faxed clinicals to [facility]([place])- CSW will follow up on referral tomorrow.  02/27/20-[place] SNF informed CSW they do not have any beds available and would contact CSW if they have a bed.   CSW will continue to follow and assist with discharge planning.  Thurmond Butts, MSW, [place] Clinical Social Worker   Expected Discharge Plan: Skilled Nursing Facility Barriers to Discharge: Continued Medical Work up, Other (comment)(ability to tolerate dialysis in recliner)  Expected Discharge Plan and Services Expected Discharge Plan: [place] In-house Referral: Clinical Social Work Discharge Planning Services: CM Consult Post Acute Care Choice: [place] Living arrangements for the past 2 months: (Has been hospitalized for over 4 months)                                       Social Determinants of Health (SDOH) Interventions    Readmission Risk Interventions Readmission Risk Prevention Plan 01/28/2020  Transportation Screening Complete  PCP or Specialist Appt within 3-5 Days Not Complete  Not Complete comments plan for SNF  HRI or [place] Not Complete  HRI or Home Care Consult comments plan for SNF  Social Work Consult for [place] Planning/Counseling Complete  Palliative Care Screening Complete  Medication Review Press photographer) Referral to Pharmacy  PCP or Specialist appointment within 3-5 days of discharge Not Complete  PCP/Specialist Appt Not Complete comments plan for SNF  HRI or Home Care Consult Complete  SW Recovery Care/Counseling Consult Complete  Palliative Care Screening Not Complete  Comments appropriate at this time   [place] Complete

## 2020-03-01 NOTE — TOC Progression Note (Signed)
Transition of Care [facility]) - Progression Note    Patient Details  Name: Bailey Cox MRN: [id number] Date of Birth: December 30, 1945  Transition of Care [facility]) CM/SW [place], [place] Phone Number: 03/01/2020, 2:09 PM  Clinical Narrative:    CSW spoke w/ colleague Caren Griffins. [place] has a list for placement at this time. CSW called and confirmed with pt daughter [name] that she understands that pt transport is not covered by [facility] and CSW provided the three estimates: [place] [facility] [place]: $3734.28 PTAR: $3,275.00  [name] still would like pt closer to them in [place]. She is aware of wait list for [facility] and gives permission for CSW to f/u with [place], [place].   [name] wants to see what upfront costs would be for [employer]. CSW given permission to email those to [email or web address]  CSW also updated Caren Griffins, CSW on 4E. We will work on preferred SNFs in regard to bed availability.    Expected Discharge Plan: Skilled Nursing Facility Barriers to Discharge: Continued Medical Work up, Other (comment)(ability to tolerate dialysis in recliner)  Expected Discharge Plan and Services Expected Discharge Plan: [place] In-house Referral: Clinical Social Work Discharge Planning Services: CM Consult Post Acute Care Choice: [place] Living arrangements for the past 2 months: (Has been hospitalized for over 4 months)                  Readmission Risk Interventions Readmission Risk Prevention Plan 01/28/2020  Transportation Screening Complete  PCP or Specialist Appt within 3-5 Days Not Complete  Not Complete comments plan for SNF  HRI or [place] Not Complete  HRI or Home Care Consult comments plan for SNF  Social Work Consult for [place] Planning/Counseling Complete   Palliative Care Screening Complete  Medication Review Press photographer) Referral to Pharmacy  PCP or Specialist appointment within 3-5 days of discharge Not Complete  PCP/Specialist Appt Not Complete comments plan for SNF  HRI or Home Care Consult Complete  SW Recovery Care/Counseling Consult Complete  Palliative Care Screening Not Complete  Comments appropriate at this time  [place] Complete

## 2020-03-02 DIAGNOSIS — N179 Acute kidney failure, unspecified: Secondary | ICD-10-CM | POA: Diagnosis not present

## 2020-03-02 LAB — CBC WITH DIFFERENTIAL/PLATELET
Abs Immature Granulocytes: 0.07 10*3/uL (ref 0.00–0.07)
Basophils Absolute: 0.1 10*3/uL (ref 0.0–0.1)
Basophils Relative: 1 %
Eosinophils Absolute: 0.1 10*3/uL (ref 0.0–0.5)
Eosinophils Relative: 1 %
HCT: 30.2 % — ABNORMAL LOW (ref 36.0–46.0)
Hemoglobin: 8.1 g/dL — ABNORMAL LOW (ref 12.0–15.0)
Immature Granulocytes: 1 %
Lymphocytes Relative: 10 %
Lymphs Abs: 0.7 10*3/uL (ref 0.7–4.0)
MCH: 26 pg (ref 26.0–34.0)
MCHC: 26.8 g/dL — ABNORMAL LOW (ref 30.0–36.0)
MCV: 97.1 fL (ref 80.0–100.0)
Monocytes Absolute: 0.9 10*3/uL (ref 0.1–1.0)
Monocytes Relative: 13 %
Neutro Abs: 5.3 10*3/uL (ref 1.7–7.7)
Neutrophils Relative %: 74 %
Platelets: 169 10*3/uL (ref 150–400)
RBC: 3.11 MIL/uL — ABNORMAL LOW (ref 3.87–5.11)
RDW: 18 % — ABNORMAL HIGH (ref 11.5–15.5)
WBC: 7.1 10*3/uL (ref 4.0–10.5)
nRBC: 0.3 % — ABNORMAL HIGH (ref 0.0–0.2)

## 2020-03-02 LAB — COMPREHENSIVE METABOLIC PANEL
ALT: 22 U/L (ref 0–44)
AST: 27 U/L (ref 15–41)
Albumin: 2.3 g/dL — ABNORMAL LOW (ref 3.5–5.0)
Alkaline Phosphatase: 207 U/L — ABNORMAL HIGH (ref 38–126)
Anion gap: 9 (ref 5–15)
BUN: 36 mg/dL — ABNORMAL HIGH (ref 8–23)
CO2: 30 mmol/L (ref 22–32)
Calcium: 8.7 mg/dL — ABNORMAL LOW (ref 8.9–10.3)
Chloride: 101 mmol/L (ref 98–111)
Creatinine, Ser: 1.38 mg/dL — ABNORMAL HIGH (ref 0.44–1.00)
GFR calc Af Amer: 44 mL/min — ABNORMAL LOW (ref >60)
GFR calc non Af Amer: 38 mL/min — ABNORMAL LOW (ref >60)
Glucose, Bld: 97 mg/dL (ref 70–99)
Potassium: 4.1 mmol/L (ref 3.5–5.1)
Sodium: 140 mmol/L (ref 135–145)
Total Bilirubin: 0.2 mg/dL — ABNORMAL LOW (ref 0.3–1.2)
Total Protein: 5.9 g/dL — ABNORMAL LOW (ref 6.5–8.1)

## 2020-03-02 LAB — GLUCOSE, CAPILLARY
Glucose-Capillary: 101 mg/dL — ABNORMAL HIGH (ref 70–99)
Glucose-Capillary: 82 mg/dL (ref 70–99)
Glucose-Capillary: 113 mg/dL — ABNORMAL HIGH (ref 70–99)
Glucose-Capillary: 146 mg/dL — ABNORMAL HIGH (ref 70–99)
Glucose-Capillary: 131 mg/dL — ABNORMAL HIGH (ref 70–99)

## 2020-03-02 LAB — MAGNESIUM: Magnesium: 2.5 mg/dL — ABNORMAL HIGH (ref 1.7–2.4)

## 2020-03-02 LAB — PHOSPHORUS: Phosphorus: 3.6 mg/dL (ref 2.5–4.6)

## 2020-03-02 MED ORDER — NEPRO/CARBSTEADY PO LIQD
237.0000 mL | ORAL | Status: DC | PRN
  Administered 2020-03-13: 237 mL via ORAL
  Filled 2020-03-02 (×2): qty 237

## 2020-03-02 MED ORDER — NEPRO/CARBSTEADY PO LIQD: 237.0000 mL | Freq: Two times a day (BID) | ORAL | Status: DC

## 2020-03-02 NOTE — Progress Notes (Signed)
Admit: 01/05/2020 LOS: 87  32F prolonged AKI now ESRD  Subjective:  Last HD on 4/26 with 2 kg UF.  (Didn't get the chair for that treatment as there wasn't a chair available).  today for dialysis because one was not available.    Review of systems:   denies any n/v today  Some mild shortness of breath No chest pain    04/26 0701 - 04/27 0700 In: 150 [P.O.:60] Out: 2000   Filed Weights   03/01/20 1159 03/01/20 1538 03/02/20 0342  Weight: 89.4 kg 87.3 kg 86.7 kg    Scheduled Meds: . acetaminophen (TYLENOL) oral liquid 160 mg/5 mL  1,000 mg Per Tube Q8H  . bisacodyl  10 mg Rectal Daily  . busPIRone  5 mg Per Tube BID  . calcium acetate  667 mg Oral TID WC  . camphor-menthol   Topical QID  . chlorhexidine gluconate (MEDLINE KIT)  15 mL Mouth Rinse BID  . Chlorhexidine Gluconate Cloth  6 each Topical Q0600  . darbepoetin (ARANESP) injection - DIALYSIS  200 mcg Subcutaneous Q Wed-HD  . feeding supplement (NEPRO CARB STEADY)  1,000 mL Per Tube Q24H  . feeding supplement (PRO-STAT SUGAR FREE 64)  30 mL Per Tube TID  . insulin aspart  0-15 Units Subcutaneous Q4H  . insulin glargine  8 Units Subcutaneous BID  . lactulose  20 g Per Tube TID  . pantoprazole sodium  40 mg Per Tube BID  . PARoxetine  40 mg Per Tube Daily  . sodium chloride flush  10-40 mL Intracatheter Q12H   Continuous Infusions: . cefTRIAXone (ROCEPHIN)  IV 2 g (03/01/20 1044)  . ferric gluconate (FERRLECIT/NULECIT) IV 125 mg (03/01/20 1519)   PRN Meds:.feeding supplement (NEPRO CARB STEADY), guaiFENesin, metoprolol tartrate, ondansetron (ZOFRAN) IV, phenol, promethazine, sodium chloride flush  Current Labs: reviewed    Physical Exam:  Blood pressure (!) 132/54, pulse 82, temperature 97.9 F (36.6 C), temperature source Oral, resp. rate (!) 98, height '5\' 4"'$  (1.626 m), weight 86.7 kg, SpO2 98 %. Adult female in bed in NAD  NCAT   s1s2 no rub CTAB and unlabored  Softly distended/nt Extr No sig LEE R IJ TDC in  place  Assessment/Plan:  1. Dialysis dep't AKI had had previously been declared ESRD. on THS schedule previously  1. CLIP completed MWF [place] MWF 2. Will be using TDC until medically suitable for AVF/G  3. She was moved to MWF schedule for HD.  Needs HD in recliner.   4. Note per her Cr trends will plan to hold HD on 4/28 for now and reassess pt tomorrow.  Note recent hx azotemia with BUN as high as 90 - may not tolerate holding. 2. Debility; full scope of care currently; palliative working with patient/family 3. Difficulting with tolerating HD, now using recliner 4. Anemia of CKD 1. ESA qThur 270mg aranesp 2. TSAT 5% 4/18, started Fe 5. Hx/o PEA arrest - supportive care per primary  6. Hx/o trach, decannulated 7. Lacks capacity for medical decision making  8. Hypercapneic RF on BiPAP per CCM/TRH 9. CKD-BMD: trend phos -mildly elevated on current regimen     Recent Labs  Lab 02/29/20 0351 03/01/20 0717 03/02/20 0818  NA 135 135 140  K 3.7 3.6 4.1  CL 96* 96* 101  CO2 '28 28 30  '$ GLUCOSE 115* 163* 97  BUN 39* 61* 36*  CREATININE 1.38* 1.85* 1.38*  CALCIUM 8.3* 8.9 8.7*  PHOS 3.2 5.0* 3.6   Recent  Labs  Lab 02/29/20 0351 03/01/20 0717 03/02/20 0818  WBC 9.0 10.2 7.1  NEUTROABS 7.4 8.2* 5.3  HGB 7.9* 8.2* 8.1*  HCT 27.5* 29.2* 30.2*  MCV 92.3 95.1 97.1  PLT 215 188 169     Noboru Bidinger C Maeola Mchaney 03/02/2020 9:50 AM

## 2020-03-02 NOTE — Progress Notes (Addendum)
Nutrition Follow-up  DOCUMENTATION CODES:   Not applicable  INTERVENTION:   Currently on renal formula, consider d/c phosphorus binder.   Continue tube feeding:  Nepro@ 51m/hr via PEG 363mProstat TID  Tube feeding regimen provides2028kcal (100% of needs),123grams of protein, and 6985mf H2O.   Nepro Shake PO prn, each supplement provides 425 kcal and 19 grams protein.  NUTRITION DIAGNOSIS:   Inadequate oral intake related to poor appetite, acute illness as evidenced by meal completion < 25%.  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 CSW working with MD and family for an appropriate d/c plan.   Appetite is poor. Meal completions charted 0-10%. Drinking Nepro shakes off/on. Tolerating tube feeding via J-tube. Plan to continue to meet 100% of nutrition needs with TF until PO progresses and remains consistent.    Admission weight: 113 kg  Current weight: 86.7 kg   Last HD 4/26: 2 kg net UF  Medications: dulcolax, phoslo, aranesp, SS novolog, lantus, lactulose Labs reviewed. CBG 101-82  Diet Order:   Diet Order            DIET DYS 2 Room service appropriate? No; Fluid consistency: Thin  Diet effective now              EDUCATION NEEDS:   No  education needs have been identified at this time  Skin:  Skin Assessment: Skin Integrity Issues: Skin Integrity Issues:: Stage I, Other (Comment) Stage I: coccyx Other: MASD groin; trach stoma; non pressure wound to L arm    Last BM:  4/26 type 6  Height:   Ht Readings from Last 1 Encounters:  01/05/20 '5\' 4"'$  (1.626 m)    Weight:   Wt Readings from Last 1 Encounters:  03/02/20 86.7 kg    Ideal Body Weight:  54.5 kg  BMI:  Body mass index is 32.81 kg/m.  Estimated Nutritional Needs:   Kcal:  1900-2100 kcal  Protein:  110-125 grams  Fluid:  >/= 1.5 L/day    KimMolli BarrowsD, LDN, CNSC Please refer to Amion for contact information.

## 2020-03-02 NOTE — Progress Notes (Signed)
Patient refused CPAP at this time. Patient aware to call for Respiratory if she would like to use CPAP during her stay.

## 2020-03-02 NOTE — Plan of Care (Signed)
  Problem: Education: Goal: Knowledge of General Education information will improve Description: Including pain rating scale, medication(s)/side effects and non-pharmacologic comfort measures Outcome: Progressing   Problem: Clinical Measurements: Goal: Will remain free from infection Outcome: Progressing Goal: Respiratory complications will improve Outcome: Progressing Goal: Cardiovascular complication will be avoided Outcome: Progressing   Problem: Nutrition: Goal: Adequate nutrition will be maintained Outcome: Progressing   Problem: Coping: Goal: Level of anxiety will decrease Outcome: Progressing   Problem: Elimination: Goal: Will not experience complications related to bowel motility Outcome: Progressing   Problem: Safety: Goal: Ability to remain free from injury will improve Outcome: Progressing

## 2020-03-02 NOTE — Progress Notes (Signed)
PROGRESS NOTE    Bailey Cox  OZD:[id number] DOB: Aug 30, 1946 DOA: 01/05/2020 PCP: Patient, No Pcp Per   Chief Complaint  Patient presents with  . Abnormal Lab  . Leg Swelling    Brief Narrative:  74 year old female with history of cholecystitis status post cholecystectomy in 9/20 at [facility].  She was discharged to SNF but rehospitalized to [facility] and had bile duct stent.  Discharged back to SNF but readmitted with Ogilive syndrome.  Hospital course complicated by fluid overload and transferred to [facility] for dialysis.  At [facility], developed respiratory failure requiring vent support.  She underwent trach.she was eventually discharged to [facility] on 3/1 after prolonged hospitalization at [facility] but deemed too sick, and sent to [facility] due to fluid overload and acidosis.  Patient was initially admitted to ICU.  Required CRRT 3/9-3/12.  Transfer to floor on 3/14.  She pulled her trach.  Now on oxygen by nasal cannula.  She is on IHD via [place].  Has refused dialysis at times but deemed to lack capacity.  Family wants full scope of care including full code and dialysis.  She has been noncompliant with care and hemodialysis but lacks capacity. Ethics committee meeting held on 4/6.  The plan is to continue full scope of care per family's wish.  Finally she tolerated HD in recliner chair on 4/19 without sedating medications.  She will be discharged to SNF once she had outpatient HD spot and continues to tolerate HD on recliner chair.  She's been started on lactulose for elevated ammonia with concern for hepatic encephalopathy as well as nightly bipap for acute hypercapneic resp failure with altered mental status.    Assessment & Plan:   Active Problems:   Shock ([place])   Renal failure   Tracheostomy status ([place])   Chronic hypoxemic respiratory failure (HCC)   Hypotension   Sepsis ([place])   Acute respiratory failure ([place])   Acute kidney injury ([place])   Anemia due to stage 4  chronic kidney disease (HCC)   Abdominal pain   Goals of care, counseling/discussion   Depression, recurrent ([place])   Palliative care by specialist   Weakness   Pressure injury of skin   Acute metabolic encephalopathy   Pleural effusion   Acute respiratory failure with hypoxia and hypercapnia (HCC)   Abnormal CXR  Acute metabolic encephalopathy  Acute Hypercapneic Resp Failure  Respiratory Acidosis - . Per family at bedside on 4/21, she's been lethargic since Saturday.  This seems to have improved on bipap with her respiratory acidosis.  Multiple potential etiologies including azotemia, iatrogenic, delirium. No focal neuro deficits.  No obvious infectious signs.  Just completed CTX and azithromycin for aspiration pneumonia.  Ammonia slightly elevated.  TSH, B12 and RPR within normal.  Now with hypothermia unknown etiology.  -Continue lactulose 20 g TID daily for elevated ammonia -acidemia improved, persistent resp acidosis -  her mental status appears to be improving, I think ok to come off bipap during the day now and continue nightly bipap  - Follow UA and culture - no growth -Hold Seroquel and haldol -Discontinue oxycodone and trazodone -Reduce fentanyl -Frequent orientation and delirium precautions. -Hypothermia work-up as below.  Pseudomonas aeruginosa pneumonia  Aspiration Pneumonia  Right Sided Effusion - s/p cefepime 3/1-3/7 (tracheal aspirate from 3/4 with pseudomonas) -CXR on 4/14 with RML, RLL and Left mid lung opacities concerning for multifocal pneumonia -Completed 5 days of CTX and azithromycin on 4/16. - CXR 4/21 with moderate to large R  effusion (unchanged) with persistent dense airspace disease at the right middle lobe and R base as well as Bailey Cox probable small L effusion with slightly improved aeration at the L base with residual atelectasis vs pneumonia at the L medial base - s/p thoracentesis 4/22 by pulmonology - 900 cc clear fluid out -> exudative per light's -> follow  culture (no growth x 3 days) and cytology (no malignant cells identified - mixed acute and chronic inflammation) - likely parapneumonic effusion -> will extend abx course x 7 days (discussed with pccm) - ceftriaxone 4/25 -> 5/1  - cxr 4/23 with persistent effusion - repeat imaging as needed - Aspiration precautions.  Hypothermia: Unclear etiology.  No fever or leukocytosis to suggest infectious process.  Temp borderline elevated 4/21, 99.7.  Hemodynamically stable. -Bear Hugger -Check TSH (wnl), urinalysis, urine culture (NG), blood culture (NGTD) and lactic acid (wnl)  Acute kidney injury on chronic kidney disease, stage IV-likely due to ATN from sepsis -Noncompliant with HD but lacks capacity.  Continue HD in recliner. - CLIP completed MWF [place] - per renal, planning to hold HD for now on 4/28 and reassess on day of - Family to stay at bedside during HD for compliance.   - Nephrology managing  Acute on chronic hypoxic respiratory failure-likely due to aspiration pneumonia and fluid overload.  Saturating in mid 90s on 3 L by [place].  No respiratory distress.  Decannulated on 3/19. -Wean oxygen as able.  Minimal oxygen to keep saturation above 88%.  -Continue encouraging CPT. -needs nightly bipap  DM-2 with hyperglycemia: A1c 5.7%. -Continue SSI-moderate every 4 hours -Continue Lantus 8 units twice daily.  Essential hypertension: Normotensive -Continue PRN metoprolol  Iron deficiency anemia with anemia of chronic kidney disease: Hgb 9.4 (admit)>>> 8.1.  Iron sat 5%. -ESA and IV iron per nephrology -stable  Hypokalemia: Resolved. -Continue monitoring - per renal in dialysis  Hyponatremia: Resolved.  Anxiety/depression/agitation -Continue Paxil and BuSpar.  Discontinue trazodone. -Frequent orientation and delirium precautions -discontinued haldol and seroquel with lethargy   GERD/constipation -Continue PPI and bowel regimen  Mild thrombocytopenia: Stable ->  resolved. -Continue monitoring  Generalized weakness/debility -PT/OT-not able to participate in therapy.  Requiring mechanical lift to chair.  Chronic pain: -Scheduled Tylenol 1000 mg 3 times daily -Discontinued oxycodone and reduced IV fentanyl due to AMS.  Goals of care/Ethical issues-complex medical issue with guarded prognosis. Patient refused HD previously but deemed to lack capacity by psych. Daughter wishes her to be full code with full scope of care including dialysis.  Continued on HD with the help of medications and family. -Palliative care following -Ethics involved previously.  Nutritional support/dysphagia -Appreciate input by SLP-recommended dysphagia 2 diet. -Appreciate input by dietitian -Continue TF (resume today) and dysphagia 2 diet Nutrition Problem: Inadequate oral intake Etiology: inability to eat  Signs/Symptoms: NPO status  Interventions: Tube feeding  Pressure Injury:  Pressure Injury 02/16/20 Coccyx Stage 1 -  Intact skin with non-blanchable redness of Bailey Cox localized area usually over Bailey Cox bony prominence. (Active)  02/16/20 1700  Location: Coccyx  Location Orientation:   Staging: Stage 1 -  Intact skin with non-blanchable redness of Bailey Cox localized area usually over Bailey Cox bony prominence.  Wound Description (Comments):   Present on Admission:    DVT prophylaxis: SCD Code Status: full Family Communication: daughter over phone 4/27 Disposition:   Status is: Inpatient  Remains inpatient appropriate because:Inpatient level of care appropriate due to severity of illness   Dispo: The patient is from: [facility]  Anticipated d/c is to: pending              Anticipated d/c date is: > 3 days              Patient currently is not medically stable to d/c.   Consultants:   PCCM  Nephrology  PMT  Procedures:   none  Antimicrobials: Anti-infectives (From admission, onward)   Start     Dose/Rate Route Frequency Ordered Stop   02/28/20 1500   cefTRIAXone (ROCEPHIN) 2 g in sodium chloride 0.9 % 100 mL IVPB     2 g 200 mL/hr over 30 Minutes Intravenous Every 24 hours 02/28/20 1404 03/05/20 2359   02/26/20 1300  cefTRIAXone (ROCEPHIN) 1 g in sodium chloride 0.9 % 100 mL IVPB  Status:  Discontinued     1 g 200 mL/hr over 30 Minutes Intravenous Daily 02/26/20 1201 02/27/20 0930   02/16/20 1230  cefTRIAXone (ROCEPHIN) 2 g in sodium chloride 0.9 % 100 mL IVPB     2 g 200 mL/hr over 30 Minutes Intravenous Every 24 hours 02/16/20 1127 02/20/20 0941   02/16/20 1230  azithromycin (ZITHROMAX) 500 mg in sodium chloride 0.9 % 250 mL IVPB     500 mg 250 mL/hr over 60 Minutes Intravenous Every 24 hours 02/16/20 1127 02/20/20 1140   01/10/20 0930  vancomycin (VANCOCIN) IVPB 1000 mg/200 mL premix  Status:  Discontinued     1,000 mg 200 mL/hr over 60 Minutes Intravenous  Once 01/10/20 0915 01/13/20 0623   01/06/20 2200  ceFEPIme (MAXIPIME) 2 g in sodium chloride 0.9 % 100 mL IVPB     2 g 200 mL/hr over 30 Minutes Intravenous Every 24 hours 01/05/20 2251 01/11/20 2234   01/05/20 2200  ceFEPIme (MAXIPIME) 2 g in sodium chloride 0.9 % 100 mL IVPB     2 g 200 mL/hr over 30 Minutes Intravenous  Once 01/05/20 2148 01/06/20 0015   01/05/20 2200  metroNIDAZOLE (FLAGYL) IVPB 500 mg     500 mg 100 mL/hr over 60 Minutes Intravenous  Once 01/05/20 2148 01/06/20 0015   01/05/20 2200  vancomycin (VANCOCIN) IVPB 1000 mg/200 mL premix  Status:  Discontinued     1,000 mg 200 mL/hr over 60 Minutes Intravenous  Once 01/05/20 2148 01/05/20 2151   01/05/20 2200  vancomycin (VANCOREADY) IVPB 2000 mg/400 mL  Status:  Discontinued     2,000 mg 200 mL/hr over 120 Minutes Intravenous  Once 01/05/20 2151 01/05/20 2223      Subjective: Confused No new complaints Feeling better  Objective: Vitals:   03/02/20 1500 03/02/20 1644 03/02/20 1700 03/02/20 1800  BP:  (!) 111/49 (!) 116/42 (!) 133/57  Pulse: 85     Resp:  18    Temp:  97.6 F (36.4 C)    TempSrc:   Oral    SpO2: 97%     Weight:      Height:        Intake/Output Summary (Last 24 hours) at 03/02/2020 1822 Last data filed at 03/02/2020 1800 Gross per 24 hour  Intake 1360 ml  Output --  Net 1360 ml   Filed Weights   03/01/20 1159 03/01/20 1538 03/02/20 0342  Weight: 89.4 kg 87.3 kg 86.7 kg    Examination:  General: No acute distress. Cardiovascular: Heart sounds show Bailey Cox regular rate, and rhythm.  Lungs: Clear to auscultation bilaterally  Abdomen: Soft, nontender, nondistended  Neurological: Alert and disoriented. Moves all extremities 4. Cranial nerves II through  XII grossly intact. Skin: Warm and dry. No rashes or lesions. Extremities: No clubbing or cyanosis. No edema.    Data Reviewed: I have personally reviewed following labs and imaging studies  CBC: Recent Labs  Lab 02/27/20 0324 02/28/20 0327 02/29/20 0351 03/01/20 0717 03/02/20 0818  WBC 10.1 12.3* 9.0 10.2 7.1  NEUTROABS 8.5* 10.7* 7.4 8.2* 5.3  HGB 7.5* 7.9* 7.9* 8.2* 8.1*  HCT 26.3* 27.2* 27.5* 29.2* 30.2*  MCV 94.6 93.8 92.3 95.1 97.1  PLT 204 202 215 188 103    Basic Metabolic Panel: Recent Labs  Lab 02/27/20 0324 02/28/20 0327 02/29/20 0351 03/01/20 0717 03/02/20 0818  NA 134* 132* 135 135 140  K 4.0 3.3* 3.7 3.6 4.1  CL 94* 93* 96* 96* 101  CO2 '27 24 28 28 30  '$ GLUCOSE 87 135* 115* 163* 97  BUN 78* 90* 39* 61* 36*  CREATININE 2.08* 2.29* 1.38* 1.85* 1.38*  CALCIUM 9.3 8.9 8.3* 8.9 8.7*  MG 2.5* 2.6* 2.3 2.6* 2.5*  PHOS 3.8 4.4 3.2 5.0* 3.6    GFR: Estimated Creatinine Clearance: 39.8 mL/min (Bailey Cox) (by C-G formula based on SCr of 1.38 mg/dL (H)).  Liver Function Tests: Recent Labs  Lab 02/27/20 0324 02/28/20 0327 02/29/20 0351 03/01/20 0717 03/02/20 0818  AST '24 26 27 23 27  '$ ALT '23 25 23 22 22  '$ ALKPHOS 188* 184* 168* 224* 207*  BILITOT 0.7 0.5 0.3 0.6 0.2*  PROT 5.7* 5.4* 6.0* 6.1* 5.9*  ALBUMIN 2.3* 2.2* 2.4* 2.4* 2.3*    CBG: Recent Labs  Lab 03/01/20 2335  03/02/20 0340 03/02/20 0748 03/02/20 1132 03/02/20 1536  GLUCAP 153* 101* 82 113* 146*     Recent Results (from the past 240 hour(s))  Culture, blood (routine x 2)     Status: None   Collection Time: 02/24/20 11:52 AM   Specimen: BLOOD  Result Value Ref Range Status   Specimen Description BLOOD RIGHT ANTECUBITAL  Final   Special Requests   Final    BOTTLES DRAWN AEROBIC ONLY Blood Culture results may not be optimal due to an inadequate volume of blood received in culture bottles   Culture   Final    NO GROWTH 5 DAYS Performed at [facility], [place] [address] [place], [place] [address]    Report Status 02/29/2020 FINAL  Final  Culture, blood (routine x 2)     Status: None   Collection Time: 02/24/20 11:52 AM   Specimen: BLOOD  Result Value Ref Range Status   Specimen Description BLOOD LEFT ANTECUBITAL  Final   Special Requests   Final    BOTTLES DRAWN AEROBIC AND ANAEROBIC Blood Culture results may not be optimal due to an inadequate volume of blood received in culture bottles   Culture   Final    NO GROWTH 5 DAYS Performed at [facility], [place] [address] [place], [place] [address]    Report Status 02/29/2020 FINAL  Final  Culture, Urine     Status: None   Collection Time: 02/25/20  4:00 PM   Specimen: Urine, Random  Result Value Ref Range Status   Specimen Description URINE, RANDOM  Final   Special Requests NONE  Final   Culture   Final    NO GROWTH Performed at [facility], [place] [address], [place], [place] [address]    Report Status 02/26/2020 FINAL  Final  Body fluid culture (includes gram stain)     Status: None   Collection  Time: 02/26/20  4:44 PM   Specimen: Pleural Fluid  Result Value Ref Range Status   Specimen Description FLUID RIGHT PLEURAL  Final   Special Requests NONE  Final   Gram Stain NO ORGANISMS SEEN NO WBC SEEN   Final   Culture   Final    NO GROWTH 3 DAYS Performed at [facility], [place] [address] [place],   [address]    Report Status 02/29/2020 FINAL  Final         Radiology Studies: No results found.      Scheduled Meds: . acetaminophen (TYLENOL) oral liquid 160 mg/5 mL  1,000 mg Per Tube Q8H  . bisacodyl  10 mg Rectal Daily  . busPIRone  5 mg Per Tube BID  . calcium acetate  667 mg Oral TID WC  . camphor-menthol   Topical QID  . chlorhexidine gluconate (MEDLINE KIT)  15 mL Mouth Rinse BID  . Chlorhexidine Gluconate Cloth  6 each Topical Q0600  . darbepoetin (ARANESP) injection - DIALYSIS  200 mcg Subcutaneous Q Wed-HD  . feeding supplement (NEPRO CARB STEADY)  1,000 mL Per Tube Q24H  . feeding supplement (PRO-STAT SUGAR FREE 64)  30 mL Per Tube TID  . insulin aspart  0-15 Units Subcutaneous Q4H  . insulin glargine  8 Units Subcutaneous BID  . lactulose  20 g Per Tube TID  . pantoprazole sodium  40 mg Per Tube BID  . PARoxetine  40 mg Per Tube Daily  . sodium chloride flush  10-40 mL Intracatheter Q12H   Continuous Infusions: . cefTRIAXone (ROCEPHIN)  IV Stopped (03/02/20 1015)  . ferric gluconate (FERRLECIT/NULECIT) IV 125 mg (03/01/20 1519)     LOS: 57 days    Time spent: over 45 min    Fayrene Helper, MD [facility]   To contact the attending provider between 7A-7P or the covering provider during after hours 7P-7A, please log into the web site [email or web address] and access using universal [place] password for that web site. If you do not have the password, please call the hospital operator.  03/02/2020, 6:22 PM

## 2020-03-02 NOTE — Progress Notes (Signed)
Renal Navigator called patient's daughter, [name] to offer ongoing support. She states understanding of what is going on with her mother and that she is trying to get time off to come back to be with her again, though the end of the month for a hospital Billing Specialist is difficult. She is unsure that she will be here tomorrow and more hopeful that Friday may be a possibility. She also states concern that her hospital may be outsourcing her position in the near future. Navigator discussed patient's acceptance to OP HD clinic at [facility], MWF 12:00pm, though patient is not medically ready for discharge currently and Navigator understands that family is still requesting SNF placement. Navigator notes that this is a big hurdle to have overcome, since it means that patient can go be safely discharged from the hospital once she is medically cleared. Patient's daughter states appreciation with all support and gladness that patient has been accepted for OP HD treatment close to home.  Navigator remains available for support and assistance as needed.  Alphonzo Cruise,   Renal Navigator [phone]

## 2020-03-03 LAB — GLUCOSE, CAPILLARY
Glucose-Capillary: 126 mg/dL — ABNORMAL HIGH (ref 70–99)
Glucose-Capillary: 131 mg/dL — ABNORMAL HIGH (ref 70–99)
Glucose-Capillary: 143 mg/dL — ABNORMAL HIGH (ref 70–99)
Glucose-Capillary: 88 mg/dL (ref 70–99)
Glucose-Capillary: 90 mg/dL (ref 70–99)
Glucose-Capillary: 91 mg/dL (ref 70–99)

## 2020-03-03 LAB — CBC WITH DIFFERENTIAL/PLATELET
Abs Immature Granulocytes: 0.06 10*3/uL (ref 0.00–0.07)
Basophils Absolute: 0.1 10*3/uL (ref 0.0–0.1)
Basophils Relative: 1 %
Eosinophils Absolute: 0.1 10*3/uL (ref 0.0–0.5)
Eosinophils Relative: 1 %
HCT: 30.7 % — ABNORMAL LOW (ref 36.0–46.0)
Hemoglobin: 8.6 g/dL — ABNORMAL LOW (ref 12.0–15.0)
Immature Granulocytes: 1 %
Lymphocytes Relative: 10 %
Lymphs Abs: 0.8 10*3/uL (ref 0.7–4.0)
MCH: 27.3 pg (ref 26.0–34.0)
MCHC: 28 g/dL — ABNORMAL LOW (ref 30.0–36.0)
MCV: 97.5 fL (ref 80.0–100.0)
Monocytes Absolute: 0.8 10*3/uL (ref 0.1–1.0)
Monocytes Relative: 9 %
Neutro Abs: 6.8 10*3/uL (ref 1.7–7.7)
Neutrophils Relative %: 78 %
Platelets: UNDETERMINED 10*3/uL (ref 150–400)
RBC: 3.15 MIL/uL — ABNORMAL LOW (ref 3.87–5.11)
RDW: 18.4 % — ABNORMAL HIGH (ref 11.5–15.5)
WBC: 8.7 10*3/uL (ref 4.0–10.5)
nRBC: 0.2 % (ref 0.0–0.2)

## 2020-03-03 LAB — MAGNESIUM: Magnesium: 2.5 mg/dL — ABNORMAL HIGH (ref 1.7–2.4)

## 2020-03-03 LAB — COMPREHENSIVE METABOLIC PANEL
ALT: 25 U/L (ref 0–44)
AST: 29 U/L (ref 15–41)
Albumin: 2.4 g/dL — ABNORMAL LOW (ref 3.5–5.0)
Alkaline Phosphatase: 221 U/L — ABNORMAL HIGH (ref 38–126)
Anion gap: 14 (ref 5–15)
BUN: 52 mg/dL — ABNORMAL HIGH (ref 8–23)
CO2: 24 mmol/L (ref 22–32)
Calcium: 9.1 mg/dL (ref 8.9–10.3)
Chloride: 100 mmol/L (ref 98–111)
Creatinine, Ser: 1.68 mg/dL — ABNORMAL HIGH (ref 0.44–1.00)
GFR calc Af Amer: 35 mL/min — ABNORMAL LOW (ref >60)
GFR calc non Af Amer: 30 mL/min — ABNORMAL LOW (ref >60)
Glucose, Bld: 144 mg/dL — ABNORMAL HIGH (ref 70–99)
Potassium: 3.7 mmol/L (ref 3.5–5.1)
Sodium: 138 mmol/L (ref 135–145)
Total Bilirubin: 0.4 mg/dL (ref 0.3–1.2)
Total Protein: 6.2 g/dL — ABNORMAL LOW (ref 6.5–8.1)

## 2020-03-03 LAB — PHOSPHORUS: Phosphorus: 4.4 mg/dL (ref 2.5–4.6)

## 2020-03-03 LAB — MRSA PCR SCREENING: MRSA by PCR: POSITIVE — AB

## 2020-03-03 MED ORDER — HEPARIN SODIUM (PORCINE) 5000 UNIT/ML IJ SOLN
5000.0000 [IU] | Freq: Three times a day (TID) | INTRAMUSCULAR | Status: DC
  Administered 2020-03-03 – 2020-03-21 (×52): 5000 [IU] via SUBCUTANEOUS
  Filled 2020-03-03 (×53): qty 1

## 2020-03-03 MED ORDER — MUPIROCIN 2 % EX OINT
1.0000 "application " | TOPICAL_OINTMENT | Freq: Two times a day (BID) | CUTANEOUS | Status: AC
  Administered 2020-03-03 – 2020-03-07 (×10): 1 via NASAL
  Filled 2020-03-03 (×4): qty 22

## 2020-03-03 MED ORDER — CHLORHEXIDINE GLUCONATE CLOTH 2 % EX PADS
6.0000 | MEDICATED_PAD | Freq: Every day | CUTANEOUS | Status: AC
  Administered 2020-03-04 – 2020-03-08 (×4): 6 via TOPICAL

## 2020-03-03 MED ORDER — DARBEPOETIN ALFA 200 MCG/0.4ML IJ SOSY
200.0000 ug | PREFILLED_SYRINGE | INTRAMUSCULAR | Status: DC
  Administered 2020-03-03 – 2020-03-17 (×3): 200 ug via SUBCUTANEOUS
  Filled 2020-03-03 (×3): qty 0.4

## 2020-03-03 NOTE — Progress Notes (Signed)
CRITICAL VALUE ALERT  Critical Value:  MRSA PCR positive  Date & Time Notied:  03/03/2020; 15:20  Provider Notified: Dr. Sloan Leiter  Orders Received/Actions taken: see MAR

## 2020-03-03 NOTE — Progress Notes (Signed)
Admit: 01/05/2020 LOS: 76  83F prolonged AKI now ESRD  Subjective:  Last HD on 4/26 with 2 kg UF.  (Didn't get the chair for that treatment as there wasn't a chair available.)  She had 200 mL UOP over 4/27.  Feels ok.   Review of systems:  Some nausea no vomiting  No shortness of breath  No chest pain    04/27 0701 - 04/28 0700 In: 1610 [P.O.:330; I.V.:40; NG/GT:880; IV Piggyback:200] Out: 200 [Urine:200]  Filed Weights   03/01/20 1538 03/02/20 0342 03/03/20 0400  Weight: 87.3 kg 86.7 kg 87.7 kg    Scheduled Meds: . acetaminophen (TYLENOL) oral liquid 160 mg/5 mL  1,000 mg Per Tube Q8H  . bisacodyl  10 mg Rectal Daily  . busPIRone  5 mg Per Tube BID  . calcium acetate  667 mg Oral TID WC  . camphor-menthol   Topical QID  . chlorhexidine gluconate (MEDLINE KIT)  15 mL Mouth Rinse BID  . Chlorhexidine Gluconate Cloth  6 each Topical Q0600  . darbepoetin (ARANESP) injection - DIALYSIS  200 mcg Subcutaneous Q Wed-HD  . feeding supplement (NEPRO CARB STEADY)  1,000 mL Per Tube Q24H  . feeding supplement (PRO-STAT SUGAR FREE 64)  30 mL Per Tube TID  . insulin aspart  0-15 Units Subcutaneous Q4H  . insulin glargine  8 Units Subcutaneous BID  . lactulose  20 g Per Tube TID  . pantoprazole sodium  40 mg Per Tube BID  . PARoxetine  40 mg Per Tube Daily  . sodium chloride flush  10-40 mL Intracatheter Q12H   Continuous Infusions: . cefTRIAXone (ROCEPHIN)  IV 2 g (03/03/20 1000)  . ferric gluconate (FERRLECIT/NULECIT) IV 125 mg (03/01/20 1519)   PRN Meds:.feeding supplement (NEPRO CARB STEADY), guaiFENesin, metoprolol tartrate, ondansetron (ZOFRAN) IV, phenol, promethazine, sodium chloride flush  Current Labs: reviewed    Physical Exam:  Blood pressure (!) 121/49, pulse 79, temperature 97.8 F (36.6 C), temperature source Axillary, resp. rate 20, height '5\' 4"'$  (1.626 m), weight 87.7 kg, SpO2 97 %. Adult female in bed in NAD NCAT   s1s2 no rub CTAB and unlabored  Softly  distended/nt Extr No sig LEE R IJ TDC in place  Assessment/Plan:  1. Dialysis dep't AKI had had previously been declared ESRD. on THS schedule previously.  Assessing now for renal recovery 1. CLIP completed MWF [place] MWF 2. Will be using TDC until medically suitable for AVF/G  3. Note she was moved to MWF schedule for HD.  Needs HD in recliner.   4. Note per her Cr trends will plan to hold HD on 4/28 for now and reassess patient daily.  Note recent hx azotemia with BUN as high as 90 - may not tolerate holding. 5. Strict ins/outs 2. Debility; full scope of care currently; palliative working with patient/family 3. Hx Difficulting with tolerating HD, now using recliner 4. Anemia of CKD 1. ESA qThur 272mg aranesp 2. TSAT 5% 4/18, started Fe 5. Hx/o PEA arrest - supportive care per primary  6. Hx/o trach, decannulated 7. Lacks capacity for medical decision making  8. Hypercapneic RF on BiPAP per CCM/TRH 9. CKD-BMD: trend phos -acceptable on current regimen of phoslo   Recent Labs  Lab 03/01/20 0717 03/02/20 0818 03/03/20 0515  NA 135 140 138  K 3.6 4.1 3.7  CL 96* 101 100  CO2 '28 30 24  '$ GLUCOSE 163* 97 144*  BUN 61* 36* 52*  CREATININE 1.85* 1.38* 1.68*  CALCIUM  8.9 8.7* 9.1  PHOS 5.0* 3.6 4.4   Recent Labs  Lab 03/01/20 0717 03/02/20 0818 03/03/20 0515  WBC 10.2 7.1 8.7  NEUTROABS 8.2* 5.3 6.8  HGB 8.2* 8.1* 8.6*  HCT 29.2* 30.2* 30.7*  MCV 95.1 97.1 97.5  PLT 188 169 PLATELET CLUMPS NOTED ON SMEAR, UNABLE TO ESTIMATE     Bailey Cox 03/03/2020 2:03 PM

## 2020-03-03 NOTE — Progress Notes (Addendum)
PROGRESS NOTE        PATIENT DETAILS Name: Bailey Cox Age: 74 y.o. Sex: female Date of Birth: 11-Jan-1946 Admit Date: 01/05/2020 Admitting Physician Kandis Cocking, MD XTA:[id number], No Pcp Per  Brief Narrative: 74 year old female with history of cholecystitis status post cholecystectomy in 9/20 at [facility]. She was discharged to SNF but rehospitalized to [facility] and had bile duct stent. Discharged back to SNF but readmitted with Ogilive syndrome. Hospital course complicated by fluid overload and transferred to [facility] for dialysis. At [facility], developed respiratory failure requiring vent support. She also suffered a cardiac arrest during EGD. She underwent trach,she was eventually discharged to [facility] on 3/1 after prolonged hospitalization at [facility] but deemed too sick, and sent to [facility] due to fluid overload and acidosis.  Patient was initially admitted to ICU. Required CRRT 3/9-3/12. Transfer to floor on 3/14. She pulled her trach. Now on oxygen by nasal cannula. She is on Intermitted HD via [place]. Has refused dialysis at times but deemed to lack capacity. Family wants full scope of care including full code and dialysis. She has been noncompliant with care and hemodialysis but lacks capacity. Ethics committee meeting held on 4/6. The plan is to continue full scope of care per family's wish. Finally shetolerated HD in recliner chair on 4/19 without sedating medications. She will be discharged to SNF once she had outpatient HD spot andcontinues to tolerate HD on recliner chair.  She's been started on lactulose for elevated ammonia with concern for hepatic encephalopathy as well as nightly bipap for acute hypercapneic resp failure with altered mental status.    Subjective: Lying comfortably in bed-denies any chest pain or shortness of breath.  Assessment/Plan: Acute metabolic encephalopathy: Multifactorial etiology-including AKI,  hypercarbia, hospital/ICU delirium, hyperammonemia, pneumonia.  Seems to be improving-although waxing and waning.  TSH/vitamin B12 within normal limits-RPR nonreactive.  Continue supportive care.  Septic shock secondary to UTI and PNA: Sepsis physiology has resolved-has completed a course of antimicrobial therapy.  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  Continue BiPAP nightly-wean oxygen as able.  Avoid sedating medications like narcotics/benzodiazepines as much as possible.  Pseudomonas aeruginosa/aspiration pneumonia with right-sided parapneumonic effusion: Has completed multiple courses of antibiotics-see below.  Patient is s/p  thoracocentesis by PCCM on 4/22-plans are to continue with Rocephin until 5/1.  Maintain aspiration precautions.  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  Noncompliant with HD but lacks capacity.  DM-2 with hyperglycemia ( A1c 5.7%): CBGs stable-continue Lantus 8 units twice daily and SSI.  CBG (last 3)  Recent Labs    03/03/20 0404 03/03/20 0739 03/03/20 1139  GLUCAP 126* 143* 88    Essential hypertension: BP controlled-not on scheduled antihypertensives-remains on as needed metoprolol.    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  Hemoglobin stable-follow periodically.   Hypokalemia: Resolved.  Hyponatremia: Resolved.  Anxiety/depression/agitation: Stable this morning-calm-quiet and answered most of my questions appropriately.  Continue Paxil and BuSpar.  Maintain delirium precautions.  Due to lethargy/sedation-no longer on Haldol and Seroquel.  GERD: Continue PPI  Thrombocytopenia:  resolved.  Generalized weakness/debility: Secondary to acute illness-Per prior note--not able to participate in therapy. Requiring mechanical lift to  chair.  Chronic pain:-Scheduled Tylenol 1000 mg 3  times daily.  No longer onoxycodone and reduced IV fentanyl due to AMS.  Goals of care/Ethical issues-complex medical issue with guarded prognosis. Patient refused HD previously but deemed to lack capacity by psych. Daughter wishes her to be full code with full scope of care including dialysis.Continued on HD with the help of medications and family.Palliative care following.Ethics involved previously.   Nutrition Problem: Nutrition Problem: Inadequate oral intake Etiology: poor appetite, acute illness Signs/Symptoms: meal completion < 25% Interventions: Tube feeding, Nepro shake  Obesity: Estimated body mass index is 33.19 kg/m as calculated from the following:   Height as of this encounter: '5\' 4"'$  (1.626 m).   Weight as of this encounter: 87.7 kg.   DVT prophylaxis: SQ heparin  Diet: Diet Order            DIET DYS 2 Room service appropriate? No; Fluid consistency: Thin  Diet effective now               Code Status: Full code   Family Communication: We will update family on 4/29  Procedures : 3/5 IJ vas cath-> 4/22 right-sided thoracocentesis  Significant studies: Echo 10/24/2019 > LVEF 55%, Grade 1 DD. RV systolic function normal.  Echo 3/2: LVEF 65-70%, grade II dysfunction. RVSP  Consults: Nephrology PCCM Psychiatry Palliative care Interventional radiology Ethics  Disposition Plan:  SNF when ready for discharge Status is: Inpatient  Remains inpatient appropriate because:Inpatient level of care appropriate due to severity of illness   Dispo: The patient is from: [facility]              Anticipated d/c is to: SNF              Anticipated d/c date is: > 3 days              Patient currently is not medically stable to d/c.   Antimicrobial agents: Anti-infectives (From admission, onward)   Start     Dose/Rate Route Frequency Ordered Stop   02/28/20 1500  cefTRIAXone (ROCEPHIN) 2 g in sodium chloride  0.9 % 100 mL IVPB     2 g 200 mL/hr over 30 Minutes Intravenous Every 24 hours 02/28/20 1404 03/05/20 2359   02/26/20 1300  cefTRIAXone (ROCEPHIN) 1 g in sodium chloride 0.9 % 100 mL IVPB  Status:  Discontinued     1 g 200 mL/hr over 30 Minutes Intravenous Daily 02/26/20 1201 02/27/20 0930   02/16/20 1230  cefTRIAXone (ROCEPHIN) 2 g in sodium chloride 0.9 % 100 mL IVPB     2 g 200 mL/hr over 30 Minutes Intravenous Every 24 hours 02/16/20 1127 02/20/20 0941   02/16/20 1230  azithromycin (ZITHROMAX) 500 mg in sodium chloride 0.9 % 250 mL IVPB     500 mg 250 mL/hr over 60 Minutes Intravenous Every 24 hours 02/16/20 1127 02/20/20 1140   01/10/20 0930  vancomycin (VANCOCIN) IVPB 1000 mg/200 mL premix  Status:  Discontinued     1,000 mg 200 mL/hr over 60 Minutes Intravenous  Once 01/10/20 0915 01/13/20 0623   01/06/20 2200  ceFEPIme (MAXIPIME) 2 g in sodium chloride 0.9 % 100 mL IVPB     2 g 200 mL/hr over 30 Minutes Intravenous Every 24 hours 01/05/20 2251 01/11/20 2234   01/05/20 2200  ceFEPIme (MAXIPIME) 2 g in sodium chloride 0.9 % 100 mL IVPB     2 g 200 mL/hr over 30 Minutes Intravenous  Once 01/05/20 2148 01/06/20 0015   01/05/20 2200  metroNIDAZOLE (FLAGYL) IVPB 500  mg     500 mg 100 mL/hr over 60 Minutes Intravenous  Once 01/05/20 2148 01/06/20 0015   01/05/20 2200  vancomycin (VANCOCIN) IVPB 1000 mg/200 mL premix  Status:  Discontinued     1,000 mg 200 mL/hr over 60 Minutes Intravenous  Once 01/05/20 2148 01/05/20 2151   01/05/20 2200  vancomycin (VANCOREADY) IVPB 2000 mg/400 mL  Status:  Discontinued     2,000 mg 200 mL/hr over 120 Minutes Intravenous  Once 01/05/20 2151 01/05/20 2223       Time spent: 25 minutes-Greater than 50% of this time was spent in counseling, explanation of diagnosis, planning of further management, and coordination of care.  MEDICATIONS: Scheduled Meds: . acetaminophen (TYLENOL) oral liquid 160 mg/5 mL  1,000 mg Per Tube Q8H  . bisacodyl  10 mg  Rectal Daily  . busPIRone  5 mg Per Tube BID  . calcium acetate  667 mg Oral TID WC  . camphor-menthol   Topical QID  . chlorhexidine gluconate (MEDLINE KIT)  15 mL Mouth Rinse BID  . Chlorhexidine Gluconate Cloth  6 each Topical Q0600  . darbepoetin (ARANESP) injection - DIALYSIS  200 mcg Subcutaneous Q Wed-HD  . feeding supplement (NEPRO CARB STEADY)  1,000 mL Per Tube Q24H  . feeding supplement (PRO-STAT SUGAR FREE 64)  30 mL Per Tube TID  . insulin aspart  0-15 Units Subcutaneous Q4H  . insulin glargine  8 Units Subcutaneous BID  . lactulose  20 g Per Tube TID  . pantoprazole sodium  40 mg Per Tube BID  . PARoxetine  40 mg Per Tube Daily  . sodium chloride flush  10-40 mL Intracatheter Q12H   Continuous Infusions: . cefTRIAXone (ROCEPHIN)  IV 2 g (03/03/20 1000)  . ferric gluconate (FERRLECIT/NULECIT) IV 125 mg (03/01/20 1519)   PRN Meds:.feeding supplement (NEPRO CARB STEADY), guaiFENesin, metoprolol tartrate, ondansetron (ZOFRAN) IV, phenol, promethazine, sodium chloride flush   PHYSICAL EXAM: Vital signs: Vitals:   03/03/20 0002 03/03/20 0400 03/03/20 0729 03/03/20 1200  BP: 137/60 (!) 137/56 (!) 119/54 (!) 121/49  Pulse: 77 80 77 79  Resp: '16 18  20  '$ Temp: 98.1 F (36.7 C) 98.6 F (37 C) 97.8 F (36.6 C) 97.8 F (36.6 C)  TempSrc: Axillary Axillary Axillary Axillary  SpO2: 96% 96% 97% 97%  Weight:  87.7 kg    Height:       Filed Weights   03/01/20 1538 03/02/20 0342 03/03/20 0400  Weight: 87.3 kg 86.7 kg 87.7 kg   Body mass index is 33.19 kg/m.   Gen Exam:Alert awake-not in any distress HEENT:atraumatic, normocephalic Chest: B/L clear to auscultation anteriorly CVS:S1S2 regular Abdomen:soft non tender, non distended Extremities:no edema Neurology: Appears to have significant amount of debility/generalized weakness but seems to be moving all 4 extremities Skin: no rash  I have personally reviewed following labs and imaging studies  LABORATORY DATA:  CBC: Recent Labs  Lab 02/28/20 0327 02/29/20 0351 03/01/20 0717 03/02/20 0818 03/03/20 0515  WBC 12.3* 9.0 10.2 7.1 8.7  NEUTROABS 10.7* 7.4 8.2* 5.3 6.8  HGB 7.9* 7.9* 8.2* 8.1* 8.6*  HCT 27.2* 27.5* 29.2* 30.2* 30.7*  MCV 93.8 92.3 95.1 97.1 97.5  PLT 202 215 188 169 PLATELET CLUMPS NOTED ON SMEAR, UNABLE TO ESTIMATE    Basic Metabolic Panel: Recent Labs  Lab 02/28/20 0327 02/29/20 0351 03/01/20 0717 03/02/20 0818 03/03/20 0515  NA 132* 135 135 140 138  K 3.3* 3.7 3.6 4.1 3.7  CL 93* 96* 96*  101 100  CO2 '24 28 28 30 24  '$ GLUCOSE 135* 115* 163* 97 144*  BUN 90* 39* 61* 36* 52*  CREATININE 2.29* 1.38* 1.85* 1.38* 1.68*  CALCIUM 8.9 8.3* 8.9 8.7* 9.1  MG 2.6* 2.3 2.6* 2.5* 2.5*  PHOS 4.4 3.2 5.0* 3.6 4.4    GFR: Estimated Creatinine Clearance: 32.9 mL/min (A) (by C-G formula based on SCr of 1.68 mg/dL (H)).  Liver Function Tests: Recent Labs  Lab 02/28/20 0327 02/29/20 0351 03/01/20 0717 03/02/20 0818 03/03/20 0515  AST '26 27 23 27 29  '$ ALT '25 23 22 22 25  '$ ALKPHOS 184* 168* 224* 207* 221*  BILITOT 0.5 0.3 0.6 0.2* 0.4  PROT 5.4* 6.0* 6.1* 5.9* 6.2*  ALBUMIN 2.2* 2.4* 2.4* 2.3* 2.4*   No results for input(s): LIPASE, AMYLASE in the last 168 hours. Recent Labs  Lab 02/25/20 1512 02/26/20 0803  AMMONIA 38* 55*    Coagulation Profile: No results for input(s): INR, PROTIME in the last 168 hours.  Cardiac Enzymes: No results for input(s): CKTOTAL, CKMB, CKMBINDEX, TROPONINI in the last 168 hours.  BNP (last 3 results) No results for input(s): PROBNP in the last 8760 hours.  Lipid Profile: No results for input(s): CHOL, HDL, LDLCALC, TRIG, CHOLHDL, LDLDIRECT in the last 72 hours.  Thyroid Function Tests: No results for input(s): TSH, T4TOTAL, FREET4, T3FREE, THYROIDAB in the last 72 hours.  Anemia Panel: No results for input(s): VITAMINB12, FOLATE, FERRITIN, TIBC, IRON, RETICCTPCT in the last 72 hours.  Urine analysis:    Component Value  Date/Time   COLORURINE YELLOW 02/25/2020 1600   APPEARANCEUR TURBID (A) 02/25/2020 1600   LABSPEC 1.021 02/25/2020 1600   PHURINE 5.0 02/25/2020 1600   GLUCOSEU NEGATIVE 02/25/2020 1600   HGBUR MODERATE (A) 02/25/2020 1600   BILIRUBINUR NEGATIVE 02/25/2020 1600   KETONESUR NEGATIVE 02/25/2020 1600   PROTEINUR 100 (A) 02/25/2020 1600   NITRITE NEGATIVE 02/25/2020 1600   LEUKOCYTESUR MODERATE (A) 02/25/2020 1600    Sepsis Labs: Lactic Acid, Venous    Component Value Date/Time   LATICACIDVEN 0.7 02/24/2020 1823    MICROBIOLOGY: Recent Results (from the past 240 hour(s))  Culture, blood (routine x 2)     Status: None   Collection Time: 02/24/20 11:52 AM   Specimen: BLOOD  Result Value Ref Range Status   Specimen Description BLOOD RIGHT ANTECUBITAL  Final   Special Requests   Final    BOTTLES DRAWN AEROBIC ONLY Blood Culture results may not be optimal due to an inadequate volume of blood received in culture bottles   Culture   Final    NO GROWTH 5 DAYS Performed at [facility], [place] [address] [place], [place] [address]    Report Status 02/29/2020 FINAL  Final  Culture, blood (routine x 2)     Status: None   Collection Time: 02/24/20 11:52 AM   Specimen: BLOOD  Result Value Ref Range Status   Specimen Description BLOOD LEFT ANTECUBITAL  Final   Special Requests   Final    BOTTLES DRAWN AEROBIC AND ANAEROBIC Blood Culture results may not be optimal due to an inadequate volume of blood received in culture bottles   Culture   Final    NO GROWTH 5 DAYS Performed at [facility], [place] [address], [place], [place] [address]    Report Status 02/29/2020 FINAL  Final  Culture, Urine     Status: None   Collection Time: 02/25/20  4:00 PM   Specimen: Urine,  Random  Result Value Ref Range Status   Specimen Description URINE, RANDOM  Final   Special Requests NONE  Final   Culture   Final    NO GROWTH Performed at [facility], [address] [place], [place]  [address]    Report Status 02/26/2020 FINAL  Final  Body fluid culture (includes gram stain)     Status: None   Collection Time: 02/26/20  4:44 PM   Specimen: Pleural Fluid  Result Value Ref Range Status   Specimen Description FLUID RIGHT PLEURAL  Final   Special Requests NONE  Final   Gram Stain NO ORGANISMS SEEN NO WBC SEEN   Final   Culture   Final    NO GROWTH 3 DAYS Performed at [facility], [place] [address] [place], [place] [address]    Report Status 02/29/2020 FINAL  Final    RADIOLOGY STUDIES/RESULTS: No results found.   LOS: 14 days   Oren Binet, MD  [facility]    To contact the attending provider between 7A-7P or the covering provider during after hours 7P-7A, please log into the web site [email or web address] and access using universal [place] password for that web site. If you do not have the password, please call the hospital operator.  03/03/2020, 1:49 PM

## 2020-03-03 NOTE — TOC Progression Note (Signed)
Transition of Care [facility]) - Progression Note    Patient Details  Name: Bailey Cox MRN: [id number] Date of Birth: Jul 08, 1946  Transition of Care [facility]) CM/SW [place], LCSW Phone Number: 03/03/2020, 11:17 AM  Clinical Narrative:    CSW following for SNF placement:  -[facility] is not taking admissions -[facility] placing patient on waitlist.  -Awaiting call back from [place] -Referral being faxed to [place] at [place] family declined for referral to be sent to [facility].   Expected Discharge Plan: Skilled Nursing Facility Barriers to Discharge: Continued Medical Work up, Other (comment)(ability to tolerate dialysis in recliner)  Expected Discharge Plan and Services Expected Discharge Plan: [place] In-house Referral: Clinical Social Work Discharge Planning Services: CM Consult Post Acute Care Choice: [place] Living arrangements for the past 2 months: (Has been hospitalized for over 4 months)                                       Social Determinants of Health (SDOH) Interventions    Readmission Risk Interventions Readmission Risk Prevention Plan 01/28/2020  Transportation Screening Complete  PCP or Specialist Appt within 3-5 Days Not Complete  Not Complete comments plan for SNF  HRI or [place] Not Complete  HRI or Home Care Consult comments plan for SNF  Social Work Consult for [place] Planning/Counseling Complete  Palliative Care Screening Complete  Medication Review Press photographer) Referral to Pharmacy  PCP or Specialist appointment within 3-5 days of discharge Not Complete  PCP/Specialist Appt Not Complete comments plan for SNF  HRI or Home Care Consult Complete  SW Recovery Care/Counseling Consult Complete  Palliative  Care Screening Not Complete  Comments appropriate at this time  [place] Complete

## 2020-03-03 NOTE — Progress Notes (Signed)
Early this afternoon writer checked with dialysis nurse to see if the patient will receive her treatment today. Per dialysis nurse, patient will not receive the treatment today.

## 2020-03-04 LAB — GLUCOSE, CAPILLARY
Glucose-Capillary: 101 mg/dL — ABNORMAL HIGH (ref 70–99)
Glucose-Capillary: 104 mg/dL — ABNORMAL HIGH (ref 70–99)
Glucose-Capillary: 109 mg/dL — ABNORMAL HIGH (ref 70–99)
Glucose-Capillary: 136 mg/dL — ABNORMAL HIGH (ref 70–99)
Glucose-Capillary: 89 mg/dL (ref 70–99)
Glucose-Capillary: 90 mg/dL (ref 70–99)
Glucose-Capillary: 93 mg/dL (ref 70–99)

## 2020-03-04 LAB — RENAL FUNCTION PANEL
Albumin: 2.5 g/dL — ABNORMAL LOW (ref 3.5–5.0)
Anion gap: 13 (ref 5–15)
BUN: 71 mg/dL — ABNORMAL HIGH (ref 8–23)
CO2: 26 mmol/L (ref 22–32)
Calcium: 9.2 mg/dL (ref 8.9–10.3)
Chloride: 97 mmol/L — ABNORMAL LOW (ref 98–111)
Creatinine, Ser: 2.04 mg/dL — ABNORMAL HIGH (ref 0.44–1.00)
GFR calc Af Amer: 28 mL/min — ABNORMAL LOW (ref >60)
GFR calc non Af Amer: 24 mL/min — ABNORMAL LOW (ref >60)
Glucose, Bld: 115 mg/dL — ABNORMAL HIGH (ref 70–99)
Phosphorus: 5.4 mg/dL — ABNORMAL HIGH (ref 2.5–4.6)
Potassium: 3.4 mmol/L — ABNORMAL LOW (ref 3.5–5.1)
Sodium: 136 mmol/L (ref 135–145)

## 2020-03-04 MED ORDER — FUROSEMIDE 10 MG/ML IJ SOLN
80.0000 mg | Freq: Once | INTRAMUSCULAR | Status: AC
  Administered 2020-03-04: 80 mg via INTRAVENOUS
  Filled 2020-03-04: qty 8

## 2020-03-04 MED ORDER — POTASSIUM CHLORIDE CRYS ER 20 MEQ PO TBCR
40.0000 meq | EXTENDED_RELEASE_TABLET | Freq: Once | ORAL | Status: AC
  Administered 2020-03-04: 40 meq via ORAL
  Filled 2020-03-04: qty 2

## 2020-03-04 NOTE — TOC Progression Note (Signed)
Transition of Care [facility]) - Progression Note    Patient Details  Name: Bailey Cox MRN: [id number] Date of Birth: 01/22/1946  Transition of Care [facility]) CM/SW [place], [place] Phone Number: 03/04/2020, 3:12 PM  Clinical Narrative:    Pt referral sent to [place].   Expected Discharge Plan: Skilled Nursing Facility Barriers to Discharge: Continued Medical Work up, Other (comment)(ability to tolerate dialysis in recliner)  Expected Discharge Plan and Services Expected Discharge Plan: [place] In-house Referral: Clinical Social Work Discharge Planning Services: CM Consult Post Acute Care Choice: [place] Living arrangements for the past 2 months: (Has been hospitalized for over 4 months)   Readmission Risk Interventions Readmission Risk Prevention Plan 01/28/2020  Transportation Screening Complete  PCP or Specialist Appt within 3-5 Days Not Complete  Not Complete comments plan for SNF  HRI or [place] Not Complete  HRI or Home Care Consult comments plan for SNF  Social Work Consult for [place] Planning/Counseling Complete  Palliative Care Screening Complete  Medication Review Press photographer) Referral to Pharmacy  PCP or Specialist appointment within 3-5 days of discharge Not Complete  PCP/Specialist Appt Not Complete comments plan for SNF  HRI or Home Care Consult Complete  SW Recovery Care/Counseling Consult Complete  Palliative Care Screening Not Complete  Comments appropriate at this time  [place] Complete

## 2020-03-04 NOTE — Progress Notes (Signed)
PROGRESS NOTE        PATIENT DETAILS Name: Bailey Cox Age: 74 y.o. Sex: female Date of Birth: September 17, 1946 Admit Date: 01/05/2020 Admitting Physician Kandis Cocking, MD [id number], No Pcp Per  Brief Narrative: 74 year old female with history of cholecystitis status post cholecystectomy in 9/20 at [facility]. She was discharged to SNF but rehospitalized to [facility] and had bile duct stent. Discharged back to SNF but readmitted with Ogilive syndrome. Hospital course complicated by fluid overload and transferred to [facility] for dialysis. At [facility], developed respiratory failure requiring vent support. She also suffered a cardiac arrest during EGD. She underwent trach,she was eventually discharged to [facility] on 3/1 after prolonged hospitalization at [facility] but deemed too sick, and sent to [facility] due to fluid overload and acidosis.  Patient was initially admitted to ICU. Required CRRT 3/9-3/12. Transfer to floor on 3/14. She pulled her trach. Now on oxygen by nasal cannula. She is on Intermitted HD via [place]. Has refused dialysis at times but deemed to lack capacity. Family wants full scope of care including full code and dialysis. She has been noncompliant with care and hemodialysis but lacks capacity. Ethics committee meeting held on 4/6. The plan is to continue full scope of care per family's wish. Finally shetolerated HD in recliner chair on 4/19 without sedating medications. She will be discharged to SNF once she had outpatient HD spot andcontinues to tolerate HD on recliner chair.  She's been started on lactulose for elevated ammonia with concern for hepatic encephalopathy as well as nightly bipap for acute hypercapneic resp failure with altered mental status.    Subjective: Lying comfortably in bed-denies any chest pain or shortness of breath.  Assessment/Plan: Acute metabolic encephalopathy: Multifactorial etiology-including AKI,  hypercarbia, hospital/ICU delirium, hyperammonemia, pneumonia.  Seems to be improving-although waxing and waning.  TSH/vitamin B12 within normal limits-RPR nonreactive.  Continue supportive care.  Septic shock secondary to UTI and PNA: Sepsis physiology has resolved-has completed a course of antimicrobial therapy.  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  Continue BiPAP nightly-wean oxygen as able.  Avoid sedating medications like narcotics/benzodiazepines as much as possible.  Pseudomonas aeruginosa/aspiration pneumonia with right-sided parapneumonic effusion: Has completed multiple courses of antibiotics-see below.  Patient is s/p  thoracocentesis by PCCM on 4/22-plans are to continue with Rocephin until 5/1.  Maintain aspiration precautions.  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  Noncompliant with HD but lacks capacity.  DM-2 with hyperglycemia ( A1c 5.7%): CBGs stable-continue Lantus 8 units twice daily and SSI.  CBG (last 3)  Recent Labs    03/04/20 0416 03/04/20 0748 03/04/20 1152  GLUCAP 104* 136* 101*    Essential hypertension: BP controlled-not on scheduled antihypertensives-remains on as needed metoprolol.    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  Hemoglobin stable-follow periodically.   Hypokalemia: Replete and recheck  Hyponatremia: Resolved.  Anxiety/depression/agitation: Stable this morning-calm-quiet and answered most of my questions appropriately.  Continue Paxil and BuSpar.  Maintain delirium precautions.  Due to lethargy/sedation-no longer on Haldol and Seroquel.  GERD: Continue PPI  Thrombocytopenia:  resolved.  Generalized weakness/debility: Secondary to acute illness-Per prior note--not able to participate in therapy. Requiring  mechanical lift to chair.  Chronic pain:-Scheduled Tylenol 1000  mg 3 times daily.  No longer onoxycodone and reduced IV fentanyl due to AMS.  Goals of care/Ethical issues-complex medical issue with guarded prognosis. Patient refused HD previously but deemed to lack capacity by psych. Daughter wishes her to be full code with full scope of care including dialysis.Continued on HD with the help of medications and family.Palliative care following.Ethics involved previously.   Nutrition Problem: Nutrition Problem: Inadequate oral intake Etiology: poor appetite, acute illness Signs/Symptoms: meal completion < 25% Interventions: Tube feeding, Nepro shake  Obesity: Estimated body mass index is 34.66 kg/m as calculated from the following:   Height as of this encounter: '5\' 4"'$  (1.626 m).   Weight as of this encounter: 91.6 kg.   DVT prophylaxis: SQ heparin  Diet: Diet Order            DIET DYS 2 Room service appropriate? No; Fluid consistency: Thin  Diet effective now               Code Status: Full code   Family Communication: Spoke to daughter on 4/29  Procedures : 3/5 IJ vas cath-> 4/22 right-sided thoracocentesis>>  Significant studies: Echo 10/24/2019 > LVEF 55%, Grade 1 DD. RV systolic function normal.  Echo 3/2: LVEF 65-70%, grade II dysfunction. RVSP  Consults: Nephrology PCCM Psychiatry Palliative care Interventional radiology Ethics  Disposition Plan:  SNF when ready for discharge Status is: Inpatient  Remains inpatient appropriate because:Inpatient level of care appropriate due to severity of illness   Dispo: The patient is from: [facility]              Anticipated d/c is to: SNF              Anticipated d/c date is: > 3 days              Patient currently is not medically stable to d/c.   Antimicrobial agents: Anti-infectives (From admission, onward)   Start     Dose/Rate Route Frequency Ordered Stop   02/28/20 1500  cefTRIAXone (ROCEPHIN) 2 g  in sodium chloride 0.9 % 100 mL IVPB     2 g 200 mL/hr over 30 Minutes Intravenous Every 24 hours 02/28/20 1404 03/05/20 2359   02/26/20 1300  cefTRIAXone (ROCEPHIN) 1 g in sodium chloride 0.9 % 100 mL IVPB  Status:  Discontinued     1 g 200 mL/hr over 30 Minutes Intravenous Daily 02/26/20 1201 02/27/20 0930   02/16/20 1230  cefTRIAXone (ROCEPHIN) 2 g in sodium chloride 0.9 % 100 mL IVPB     2 g 200 mL/hr over 30 Minutes Intravenous Every 24 hours 02/16/20 1127 02/20/20 0941   02/16/20 1230  azithromycin (ZITHROMAX) 500 mg in sodium chloride 0.9 % 250 mL IVPB     500 mg 250 mL/hr over 60 Minutes Intravenous Every 24 hours 02/16/20 1127 02/20/20 1140   01/10/20 0930  vancomycin (VANCOCIN) IVPB 1000 mg/200 mL premix  Status:  Discontinued     1,000 mg 200 mL/hr over 60 Minutes Intravenous  Once 01/10/20 0915 01/13/20 0623   01/06/20 2200  ceFEPIme (MAXIPIME) 2 g in sodium chloride 0.9 % 100 mL IVPB     2 g 200 mL/hr over 30 Minutes Intravenous Every 24 hours 01/05/20 2251 01/11/20 2234   01/05/20 2200  ceFEPIme (MAXIPIME) 2 g in sodium chloride 0.9 % 100 mL IVPB     2 g 200 mL/hr over 30 Minutes Intravenous  Once 01/05/20 2148 01/06/20 0015   01/05/20 2200  metroNIDAZOLE (FLAGYL) IVPB  500 mg     500 mg 100 mL/hr over 60 Minutes Intravenous  Once 01/05/20 2148 01/06/20 0015   01/05/20 2200  vancomycin (VANCOCIN) IVPB 1000 mg/200 mL premix  Status:  Discontinued     1,000 mg 200 mL/hr over 60 Minutes Intravenous  Once 01/05/20 2148 01/05/20 2151   01/05/20 2200  vancomycin (VANCOREADY) IVPB 2000 mg/400 mL  Status:  Discontinued     2,000 mg 200 mL/hr over 120 Minutes Intravenous  Once 01/05/20 2151 01/05/20 2223       Time spent: 25 minutes-Greater than 50% of this time was spent in counseling, explanation of diagnosis, planning of further management, and coordination of care.  MEDICATIONS: Scheduled Meds: . acetaminophen (TYLENOL) oral liquid 160 mg/5 mL  1,000 mg Per Tube Q8H  .  bisacodyl  10 mg Rectal Daily  . busPIRone  5 mg Per Tube BID  . calcium acetate  667 mg Oral TID WC  . camphor-menthol   Topical QID  . chlorhexidine gluconate (MEDLINE KIT)  15 mL Mouth Rinse BID  . Chlorhexidine Gluconate Cloth  6 each Topical Q0600  . Chlorhexidine Gluconate Cloth  6 each Topical Q0600  . darbepoetin (ARANESP) injection - DIALYSIS  200 mcg Subcutaneous Q Wed-1800  . feeding supplement (NEPRO CARB STEADY)  1,000 mL Per Tube Q24H  . feeding supplement (PRO-STAT SUGAR FREE 64)  30 mL Per Tube TID  . heparin injection (subcutaneous)  5,000 Units Subcutaneous Q8H  . insulin aspart  0-15 Units Subcutaneous Q4H  . insulin glargine  8 Units Subcutaneous BID  . lactulose  20 g Per Tube TID  . mupirocin ointment  1 application Nasal BID  . pantoprazole sodium  40 mg Per Tube BID  . PARoxetine  40 mg Per Tube Daily  . sodium chloride flush  10-40 mL Intracatheter Q12H   Continuous Infusions: . cefTRIAXone (ROCEPHIN)  IV 2 g (03/04/20 0956)  . ferric gluconate (FERRLECIT/NULECIT) IV 125 mg (03/03/20 1553)   PRN Meds:.feeding supplement (NEPRO CARB STEADY), guaiFENesin, metoprolol tartrate, ondansetron (ZOFRAN) IV, phenol, promethazine, sodium chloride flush   PHYSICAL EXAM: Vital signs: Vitals:   03/04/20 0400 03/04/20 0419 03/04/20 0743 03/04/20 1149  BP: (!) 132/47  (!) 100/44 (!) 121/47  Pulse: 78  77 78  Resp: '20  14 19  '$ Temp: 97.9 F (36.6 C)  97.8 F (36.6 C) 98.2 F (36.8 C)  TempSrc: Oral  Oral Oral  SpO2: 96%  100% 97%  Weight:  91.6 kg    Height:       Filed Weights   03/02/20 0342 03/03/20 0400 03/04/20 0419  Weight: 86.7 kg 87.7 kg 91.6 kg   Body mass index is 34.66 kg/m.   Gen Exam:Alert awake-not in any distress HEENT:atraumatic, normocephalic Chest: B/L clear to auscultation anteriorly CVS:S1S2 regular Abdomen:soft non tender, non distended Extremities:no edema Neurology: Appears to have significant amount of debility/generalized weakness  but seems to be moving all 4 extremities Skin: no rash  I have personally reviewed following labs and imaging studies  LABORATORY DATA: CBC: Recent Labs  Lab 02/28/20 0327 02/29/20 0351 03/01/20 0717 03/02/20 0818 03/03/20 0515  WBC 12.3* 9.0 10.2 7.1 8.7  NEUTROABS 10.7* 7.4 8.2* 5.3 6.8  HGB 7.9* 7.9* 8.2* 8.1* 8.6*  HCT 27.2* 27.5* 29.2* 30.2* 30.7*  MCV 93.8 92.3 95.1 97.1 97.5  PLT 202 215 188 169 PLATELET CLUMPS NOTED ON SMEAR, UNABLE TO ESTIMATE    Basic Metabolic Panel: Recent Labs  Lab 02/28/20 0327  02/28/20 0327 02/29/20 0351 03/01/20 0717 03/02/20 0818 03/03/20 0515 03/04/20 0416  NA 132*   < > 135 135 140 138 136  K 3.3*   < > 3.7 3.6 4.1 3.7 3.4*  CL 93*   < > 96* 96* 101 100 97*  CO2 24   < > '28 28 30 24 26  '$ GLUCOSE 135*   < > 115* 163* 97 144* 115*  BUN 90*   < > 39* 61* 36* 52* 71*  CREATININE 2.29*   < > 1.38* 1.85* 1.38* 1.68* 2.04*  CALCIUM 8.9   < > 8.3* 8.9 8.7* 9.1 9.2  MG 2.6*  --  2.3 2.6* 2.5* 2.5*  --   PHOS 4.4   < > 3.2 5.0* 3.6 4.4 5.4*   < > = values in this interval not displayed.    GFR: Estimated Creatinine Clearance: 27.8 mL/min (A) (by C-G formula based on SCr of 2.04 mg/dL (H)).  Liver Function Tests: Recent Labs  Lab 02/28/20 0327 02/28/20 0327 02/29/20 0351 03/01/20 0717 03/02/20 0818 03/03/20 0515 03/04/20 0416  AST 26  --  '27 23 27 29  '$ --   ALT 25  --  '23 22 22 25  '$ --   ALKPHOS 184*  --  168* 224* 207* 221*  --   BILITOT 0.5  --  0.3 0.6 0.2* 0.4  --   PROT 5.4*  --  6.0* 6.1* 5.9* 6.2*  --   ALBUMIN 2.2*   < > 2.4* 2.4* 2.3* 2.4* 2.5*   < > = values in this interval not displayed.   No results for input(s): LIPASE, AMYLASE in the last 168 hours. No results for input(s): AMMONIA in the last 168 hours.  Coagulation Profile: No results for input(s): INR, PROTIME in the last 168 hours.  Cardiac Enzymes: No results for input(s): CKTOTAL, CKMB, CKMBINDEX, TROPONINI in the last 168 hours.  BNP (last 3  results) No results for input(s): PROBNP in the last 8760 hours.  Lipid Profile: No results for input(s): CHOL, HDL, LDLCALC, TRIG, CHOLHDL, LDLDIRECT in the last 72 hours.  Thyroid Function Tests: No results for input(s): TSH, T4TOTAL, FREET4, T3FREE, THYROIDAB in the last 72 hours.  Anemia Panel: No results for input(s): VITAMINB12, FOLATE, FERRITIN, TIBC, IRON, RETICCTPCT in the last 72 hours.  Urine analysis:    Component Value Date/Time   COLORURINE YELLOW 02/25/2020 1600   APPEARANCEUR TURBID (A) 02/25/2020 1600   LABSPEC 1.021 02/25/2020 1600   PHURINE 5.0 02/25/2020 1600   GLUCOSEU NEGATIVE 02/25/2020 1600   HGBUR MODERATE (A) 02/25/2020 1600   BILIRUBINUR NEGATIVE 02/25/2020 1600   KETONESUR NEGATIVE 02/25/2020 1600   PROTEINUR 100 (A) 02/25/2020 1600   NITRITE NEGATIVE 02/25/2020 1600   LEUKOCYTESUR MODERATE (A) 02/25/2020 1600    Sepsis Labs: Lactic Acid, Venous    Component Value Date/Time   LATICACIDVEN 0.7 02/24/2020 1823    MICROBIOLOGY: Recent Results (from the past 240 hour(s))  Culture, blood (routine x 2)     Status: None   Collection Time: 02/24/20 11:52 AM   Specimen: BLOOD  Result Value Ref Range Status   Specimen Description BLOOD RIGHT ANTECUBITAL  Final   Special Requests   Final    BOTTLES DRAWN AEROBIC ONLY Blood Culture results may not be optimal due to an inadequate volume of blood received in culture bottles   Culture   Final    NO GROWTH 5 DAYS Performed at [facility], [place] [address]  [place], [place] [address]    Report Status 02/29/2020 FINAL  Final  Culture, blood (routine x 2)     Status: None   Collection Time: 02/24/20 11:52 AM   Specimen: BLOOD  Result Value Ref Range Status   Specimen Description BLOOD LEFT ANTECUBITAL  Final   Special Requests   Final    BOTTLES DRAWN AEROBIC AND ANAEROBIC Blood Culture results may not be optimal due to an inadequate volume of blood received in culture bottles   Culture   Final     NO GROWTH 5 DAYS Performed at [facility], [place] [address] [place], [place] [address]    Report Status 02/29/2020 FINAL  Final  Culture, Urine     Status: None   Collection Time: 02/25/20  4:00 PM   Specimen: Urine, Random  Result Value Ref Range Status   Specimen Description URINE, RANDOM  Final   Special Requests NONE  Final   Culture   Final    NO GROWTH Performed at [facility], [place] [address] [place], [place] [address]    Report Status 02/26/2020 FINAL  Final  Body fluid culture (includes gram stain)     Status: None   Collection Time: 02/26/20  4:44 PM   Specimen: Pleural Fluid  Result Value Ref Range Status   Specimen Description FLUID RIGHT PLEURAL  Final   Special Requests NONE  Final   Gram Stain NO ORGANISMS SEEN NO WBC SEEN   Final   Culture   Final    NO GROWTH 3 DAYS Performed at [facility], [place] [address], [place], [place] [address]    Report Status 02/29/2020 FINAL  Final  MRSA PCR Screening     Status: Abnormal   Collection Time: 03/03/20 11:00 AM   Specimen: Nasopharyngeal  Result Value Ref Range Status   MRSA by PCR POSITIVE (A) NEGATIVE Final    Comment:        The GeneXpert MRSA Assay (FDA approved for NASAL specimens only), is one component of a comprehensive MRSA colonization surveillance program. It is not intended to diagnose MRSA infection nor to guide or monitor treatment for MRSA infections. RESULT CALLED TO, READ BACK BY AND VERIFIED WITH: Jefferson Fuel RN 14:35 03/03/20 ([email or web address]) Performed at [facility], [place] [address] [place], [place] [address]     RADIOLOGY STUDIES/RESULTS: No results found.   LOS: 72 days   Oren Binet, MD  [facility]    To contact the attending provider between 7A-7P or the covering provider during after hours 7P-7A, please log into the web site [email or web address] and access using universal [place] password for that web site. If you do not have the password, please  call the hospital operator.  03/04/2020, 1:26 PM

## 2020-03-04 NOTE — Progress Notes (Signed)
Nephrology progress Note:   Admit: 01/05/2020 LOS: 59  Subjective:  Last HD on 4/26 with 2 kg UF.  (Didn't get the chair for that treatment as there wasn't a chair available.)  She had 200 mL UOP over 4/27.  None charted for 4/28. No foley or purewick.  Feels ok.   Review of systems:  Denies nausea or vomiting  some shortness of breath  No chest pain    04/28 0701 - 04/29 0700 In: 1135.3 [NG/GT:935.3; IV Piggyback:200] Out: -   Filed Weights   03/02/20 0342 03/03/20 0400 03/04/20 0419  Weight: 86.7 kg 87.7 kg 91.6 kg    Scheduled Meds: . acetaminophen (TYLENOL) oral liquid 160 mg/5 mL  1,000 mg Per Tube Q8H  . bisacodyl  10 mg Rectal Daily  . busPIRone  5 mg Per Tube BID  . calcium acetate  667 mg Oral TID WC  . camphor-menthol   Topical QID  . chlorhexidine gluconate (MEDLINE KIT)  15 mL Mouth Rinse BID  . Chlorhexidine Gluconate Cloth  6 each Topical Q0600  . Chlorhexidine Gluconate Cloth  6 each Topical Q0600  . darbepoetin (ARANESP) injection - DIALYSIS  200 mcg Subcutaneous Q Wed-1800  . feeding supplement (NEPRO CARB STEADY)  1,000 mL Per Tube Q24H  . feeding supplement (PRO-STAT SUGAR FREE 64)  30 mL Per Tube TID  . furosemide  80 mg Intravenous Once  . heparin injection (subcutaneous)  5,000 Units Subcutaneous Q8H  . insulin aspart  0-15 Units Subcutaneous Q4H  . insulin glargine  8 Units Subcutaneous BID  . lactulose  20 g Per Tube TID  . mupirocin ointment  1 application Nasal BID  . pantoprazole sodium  40 mg Per Tube BID  . PARoxetine  40 mg Per Tube Daily  . sodium chloride flush  10-40 mL Intracatheter Q12H   Continuous Infusions: . cefTRIAXone (ROCEPHIN)  IV 2 g (03/04/20 0956)  . ferric gluconate (FERRLECIT/NULECIT) IV 125 mg (03/03/20 1553)   PRN Meds:.feeding supplement (NEPRO CARB STEADY), guaiFENesin, metoprolol tartrate, ondansetron (ZOFRAN) IV, phenol, promethazine, sodium chloride flush  Current Labs: reviewed    Physical Exam:  Blood pressure  (!) 121/47, pulse 78, temperature 98.2 F (36.8 C), temperature source Oral, resp. rate 19, height '5\' 4"'$  (1.626 m), weight 91.6 kg, SpO2 97 %. Adult female in bed in NAD NCAT   s1s2 no rub CTAB and unlabored  Softly distended/nt Extr No sig LEE R IJ TDC in place  Assessment/Plan:  1. Dialysis dep't AKI had had previously been declared ESRD. on THS schedule previously.  CLIP completed MWF [facility] MWF.  Will be using TDC until medically suitable for AVF/G  1. Assessing for renal recovery given Cr trends mid 1's to 2's 2. Note she was moved to MWF schedule for HD.  Needs HD in recliner.   3. Held HD on 4/28.  No HD needs today.  Reassess HD for tomorrow per her trends.  Recent hx azotemia with BUN as high as 90 - may not tolerate holding long? 4. Lasix 80 mg IV once and bladder scan with in/out cath if needed  5. Strict ins/outs - requested purewick 2. Debility; full scope of care currently; palliative working with patient/family 3. Hx Difficulting with tolerating HD, now using recliner 4. Anemia of CKD 1. ESA qThur 210mg aranesp 2. TSAT 5% 4/18, started Fe 5. Hx/o PEA arrest - supportive care per primary  6. Hx/o trach, decannulated 7. Lacks capacity for medical decision making per  charting 8. Hypercapneic RF on BiPAP per CCM/TRH 9. CKD-BMD: trend phos -acceptable on current regimen of phoslo   Recent Labs  Lab 03/02/20 0818 03/03/20 0515 03/04/20 0416  NA 140 138 136  K 4.1 3.7 3.4*  CL 101 100 97*  CO2 '30 24 26  '$ GLUCOSE 97 144* 115*  BUN 36* 52* 71*  CREATININE 1.38* 1.68* 2.04*  CALCIUM 8.7* 9.1 9.2  PHOS 3.6 4.4 5.4*   Recent Labs  Lab 03/01/20 0717 03/02/20 0818 03/03/20 0515  WBC 10.2 7.1 8.7  NEUTROABS 8.2* 5.3 6.8  HGB 8.2* 8.1* 8.6*  HCT 29.2* 30.2* 30.7*  MCV 95.1 97.1 97.5  PLT 188 169 PLATELET CLUMPS NOTED ON SMEAR, UNABLE TO ESTIMATE     Claudia Desanctis 03/04/2020 12:31 PM

## 2020-03-05 LAB — RENAL FUNCTION PANEL
Albumin: 2.4 g/dL — ABNORMAL LOW (ref 3.5–5.0)
Anion gap: 15 (ref 5–15)
BUN: 87 mg/dL — ABNORMAL HIGH (ref 8–23)
CO2: 24 mmol/L (ref 22–32)
Calcium: 9.5 mg/dL (ref 8.9–10.3)
Chloride: 96 mmol/L — ABNORMAL LOW (ref 98–111)
Creatinine, Ser: 2.25 mg/dL — ABNORMAL HIGH (ref 0.44–1.00)
GFR calc Af Amer: 25 mL/min — ABNORMAL LOW (ref >60)
GFR calc non Af Amer: 21 mL/min — ABNORMAL LOW (ref >60)
Glucose, Bld: 88 mg/dL (ref 70–99)
Phosphorus: 6.1 mg/dL — ABNORMAL HIGH (ref 2.5–4.6)
Potassium: 3.6 mmol/L (ref 3.5–5.1)
Sodium: 135 mmol/L (ref 135–145)

## 2020-03-05 LAB — CBC
HCT: 29.8 % — ABNORMAL LOW (ref 36.0–46.0)
Hemoglobin: 8.3 g/dL — ABNORMAL LOW (ref 12.0–15.0)
MCH: 27 pg (ref 26.0–34.0)
MCHC: 27.9 g/dL — ABNORMAL LOW (ref 30.0–36.0)
MCV: 97.1 fL (ref 80.0–100.0)
Platelets: 175 10*3/uL (ref 150–400)
RBC: 3.07 MIL/uL — ABNORMAL LOW (ref 3.87–5.11)
RDW: 18.1 % — ABNORMAL HIGH (ref 11.5–15.5)
WBC: 8.1 10*3/uL (ref 4.0–10.5)
nRBC: 0.4 % — ABNORMAL HIGH (ref 0.0–0.2)

## 2020-03-05 LAB — GLUCOSE, CAPILLARY
Glucose-Capillary: 128 mg/dL — ABNORMAL HIGH (ref 70–99)
Glucose-Capillary: 83 mg/dL (ref 70–99)
Glucose-Capillary: 85 mg/dL (ref 70–99)
Glucose-Capillary: 92 mg/dL (ref 70–99)
Glucose-Capillary: 96 mg/dL (ref 70–99)
Glucose-Capillary: 99 mg/dL (ref 70–99)

## 2020-03-05 MED ORDER — CHLORHEXIDINE GLUCONATE CLOTH 2 % EX PADS
6.0000 | MEDICATED_PAD | Freq: Every day | CUTANEOUS | Status: DC
  Administered 2020-03-05 – 2020-03-18 (×13): 6 via TOPICAL

## 2020-03-05 NOTE — Progress Notes (Addendum)
Med/large formed stool.  Pt has had a very little inct this shift.  Made DR ghimire aware of it.

## 2020-03-05 NOTE — TOC Progression Note (Signed)
Transition of Care [facility]) - Progression Note    Patient Details  Name: Bailey Cox MRN: [id number] Date of Birth: 07/17/1946  [facility] CM/SW [place], LCSW Phone Number: 03/05/2020, 10:52 AM  Clinical Narrative:    [facility] checking to see if they have an HD spot available. Per RN, patient refusing HD today due to pain.    Expected Discharge Plan: Skilled Nursing Facility Barriers to Discharge: Continued Medical Work up, Other (comment)(ability to tolerate dialysis in recliner)  Expected Discharge Plan and Services Expected Discharge Plan: [place] In-house Referral: Clinical Social Work Discharge Planning Services: CM Consult Post Acute Care Choice: [place] Living arrangements for the past 2 months: (Has been hospitalized for over 4 months)                                       Social Determinants of Health (SDOH) Interventions    Readmission Risk Interventions Readmission Risk Prevention Plan 01/28/2020  Transportation Screening Complete  PCP or Specialist Appt within 3-5 Days Not Complete  Not Complete comments plan for SNF  HRI or [employer] Not Complete  HRI or Home Care Consult comments plan for SNF  Social Work Consult for [place] Planning/Counseling Complete  Palliative Care Screening Complete  Medication Review Press photographer) Referral to Pharmacy  PCP or Specialist appointment within 3-5 days of discharge Not Complete  PCP/Specialist Appt Not Complete comments plan for SNF  HRI or Home Care Consult Complete  SW Recovery Care/Counseling Consult Complete  Palliative Care Screening Not Complete  Comments appropriate at this time  [place] Complete

## 2020-03-05 NOTE — Progress Notes (Signed)
PROGRESS NOTE        PATIENT DETAILS Name: Bailey Cox Age: 74 y.o. Sex: female Date of Birth: 1946-10-22 Admit Date: 01/05/2020 Admitting Physician Kandis Cocking, MD [id number], No Pcp Per  Brief Narrative: 74 year old female with history of cholecystitis status post cholecystectomy in 9/20 at [facility]. She was discharged to SNF but rehospitalized to [facility] and had bile duct stent. Discharged back to SNF but readmitted with Ogilive syndrome. Hospital course complicated by fluid overload and transferred to [facility] for dialysis. At [facility], developed respiratory failure requiring vent support. She also suffered a cardiac arrest during EGD. She underwent trach,she was eventually discharged to [facility] on 3/1 after prolonged hospitalization at [facility] but deemed too sick, and sent to [facility] due to fluid overload and acidosis.  Patient was initially admitted to ICU. Required CRRT 3/9-3/12. Transfer to floor on 3/14. She pulled her trach. Now on oxygen by nasal cannula. She is on Intermitted HD via [place]. Has refused dialysis at times but deemed to lack capacity. Family wants full scope of care including full code and dialysis. She has been noncompliant with care and hemodialysis but lacks capacity. Ethics committee meeting held on 4/6. The plan is to continue full scope of care per family's wish. Finally shetolerated HD in recliner chair on 4/19 without sedating medications. She will be discharged to SNF once she had outpatient HD spot andcontinues to tolerate HD on recliner chair.  She's been started on lactulose for elevated ammonia with concern for hepatic encephalopathy as well as nightly bipap for acute hypercapneic resp failure with altered mental status.    Subjective: Lying comfortably in bed-denies any chest pain or shortness of breath.  Assessment/Plan: Acute metabolic encephalopathy: Multifactorial etiology-including AKI,  hypercarbia, hospital/ICU delirium, hyperammonemia, pneumonia.  Seems to be improving-although waxing and waning.  TSH/vitamin B12 within normal limits-RPR nonreactive.  Continue supportive care.  Septic shock secondary to UTI and PNA: Sepsis physiology has resolved-has completed a course of antimicrobial therapy.  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  Continue BiPAP nightly-wean oxygen as able.  Avoid sedating medications like narcotics/benzodiazepines as much as possible.  Pseudomonas aeruginosa/aspiration pneumonia with right-sided parapneumonic effusion: Has completed multiple courses of antibiotics-see below.  Patient is s/p  thoracocentesis by PCCM on 4/22-plans are to continue with Rocephin until 5/1.  Maintain aspiration precautions.  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  Noncompliant with HD but lacks capacity.  DM-2 with hyperglycemia ( A1c 5.7%): CBGs stable-continue Lantus 8 units twice daily and SSI.  CBG (last 3)  Recent Labs    03/05/20 0404 03/05/20 0731 03/05/20 1242  GLUCAP 85 96 99    Essential hypertension: BP controlled-not on scheduled antihypertensives-remains on as needed metoprolol.    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  Hemoglobin stable-follow periodically.   Hypokalemia: Replete and recheck  Hyponatremia: Resolved.  Anxiety/depression/agitation: Stable this morning-calm-quiet and answered most of my questions appropriately.  Continue Paxil and BuSpar.  Maintain delirium precautions.  Due to lethargy/sedation-no longer on Haldol and Seroquel.  GERD: Continue PPI  Thrombocytopenia:  resolved.  Generalized weakness/debility: Secondary to acute illness-Per prior note--not able to participate in therapy. Requiring mechanical  lift to chair.  Chronic pain:-Scheduled Tylenol 1000  mg 3 times daily.  No longer onoxycodone and reduced IV fentanyl due to AMS.  Goals of care/Ethical issues-complex medical issue with guarded prognosis. Patient refused HD previously but deemed to lack capacity by psych. Daughter wishes her to be full code with full scope of care including dialysis.Continued on HD with the help of medications and family.Palliative care following.Ethics involved previously.   Nutrition Problem: Nutrition Problem: Inadequate oral intake Etiology: poor appetite, acute illness Signs/Symptoms: meal completion < 25% Interventions: Tube feeding, Nepro shake  Obesity: Estimated body mass index is 34.66 kg/m as calculated from the following:   Height as of this encounter: '5\' 4"'$  (1.626 m).   Weight as of this encounter: 91.6 kg.   DVT prophylaxis: SQ heparin  Diet: Diet Order            DIET DYS 2 Room service appropriate? No; Fluid consistency: Thin  Diet effective now               Code Status: Full code   Family Communication: Spoke to daughter on 4/29- will update on 5/1 as patient appears to be unchanged compared to yesterday  Procedures : 3/5 IJ vas cath-> 4/22 right-sided thoracocentesis>>  Significant studies: Echo 10/24/2019 > LVEF 55%, Grade 1 DD. RV systolic function normal.  Echo 3/2: LVEF 65-70%, grade II dysfunction. RVSP  Consults: Nephrology PCCM Psychiatry Palliative care Interventional radiology Ethics  Disposition Plan:  SNF when ready for discharge Status is: Inpatient  Remains inpatient appropriate because:Inpatient level of care appropriate due to severity of illness   Dispo: The patient is from: [facility]              Anticipated d/c is to: SNF              Anticipated d/c date is: > 3 days              Patient currently is not medically stable to d/c.   Antimicrobial agents: Anti-infectives (From admission, onward)   Start     Dose/Rate Route  Frequency Ordered Stop   02/28/20 1500  cefTRIAXone (ROCEPHIN) 2 g in sodium chloride 0.9 % 100 mL IVPB     2 g 200 mL/hr over 30 Minutes Intravenous Every 24 hours 02/28/20 1404 03/05/20 2359   02/26/20 1300  cefTRIAXone (ROCEPHIN) 1 g in sodium chloride 0.9 % 100 mL IVPB  Status:  Discontinued     1 g 200 mL/hr over 30 Minutes Intravenous Daily 02/26/20 1201 02/27/20 0930   02/16/20 1230  cefTRIAXone (ROCEPHIN) 2 g in sodium chloride 0.9 % 100 mL IVPB     2 g 200 mL/hr over 30 Minutes Intravenous Every 24 hours 02/16/20 1127 02/20/20 0941   02/16/20 1230  azithromycin (ZITHROMAX) 500 mg in sodium chloride 0.9 % 250 mL IVPB     500 mg 250 mL/hr over 60 Minutes Intravenous Every 24 hours 02/16/20 1127 02/20/20 1140   01/10/20 0930  vancomycin (VANCOCIN) IVPB 1000 mg/200 mL premix  Status:  Discontinued     1,000 mg 200 mL/hr over 60 Minutes Intravenous  Once 01/10/20 0915 01/13/20 0623   01/06/20 2200  ceFEPIme (MAXIPIME) 2 g in sodium chloride 0.9 % 100 mL IVPB     2 g 200 mL/hr over 30 Minutes Intravenous Every 24 hours 01/05/20 2251 01/11/20 2234   01/05/20 2200  ceFEPIme (MAXIPIME) 2 g in sodium chloride 0.9 % 100 mL IVPB     2 g 200 mL/hr over 30 Minutes Intravenous  Once 01/05/20 2148 01/06/20 0015   01/05/20 2200  metroNIDAZOLE (FLAGYL) IVPB 500 mg     500 mg 100 mL/hr over 60 Minutes Intravenous  Once 01/05/20 2148 01/06/20 0015   01/05/20 2200  vancomycin (VANCOCIN) IVPB 1000 mg/200 mL premix  Status:  Discontinued     1,000 mg 200 mL/hr over 60 Minutes Intravenous  Once 01/05/20 2148 01/05/20 2151   01/05/20 2200  vancomycin (VANCOREADY) IVPB 2000 mg/400 mL  Status:  Discontinued     2,000 mg 200 mL/hr over 120 Minutes Intravenous  Once 01/05/20 2151 01/05/20 2223       Time spent: 25 minutes-Greater than 50% of this time was spent in counseling, explanation of diagnosis, planning of further management, and coordination of care.  MEDICATIONS: Scheduled Meds: .  acetaminophen (TYLENOL) oral liquid 160 mg/5 mL  1,000 mg Per Tube Q8H  . bisacodyl  10 mg Rectal Daily  . busPIRone  5 mg Per Tube BID  . calcium acetate  667 mg Oral TID WC  . camphor-menthol   Topical QID  . chlorhexidine gluconate (MEDLINE KIT)  15 mL Mouth Rinse BID  . Chlorhexidine Gluconate Cloth  6 each Topical Q0600  . Chlorhexidine Gluconate Cloth  6 each Topical Q0600  . darbepoetin (ARANESP) injection - DIALYSIS  200 mcg Subcutaneous Q Wed-1800  . feeding supplement (NEPRO CARB STEADY)  1,000 mL Per Tube Q24H  . feeding supplement (PRO-STAT SUGAR FREE 64)  30 mL Per Tube TID  . heparin injection (subcutaneous)  5,000 Units Subcutaneous Q8H  . insulin aspart  0-15 Units Subcutaneous Q4H  . insulin glargine  8 Units Subcutaneous BID  . lactulose  20 g Per Tube TID  . mupirocin ointment  1 application Nasal BID  . pantoprazole sodium  40 mg Per Tube BID  . PARoxetine  40 mg Per Tube Daily  . sodium chloride flush  10-40 mL Intracatheter Q12H   Continuous Infusions: . cefTRIAXone (ROCEPHIN)  IV 2 g (03/05/20 1000)  . ferric gluconate (FERRLECIT/NULECIT) IV 125 mg (03/03/20 1553)   PRN Meds:.feeding supplement (NEPRO CARB STEADY), guaiFENesin, metoprolol tartrate, ondansetron (ZOFRAN) IV, phenol, promethazine, sodium chloride flush   PHYSICAL EXAM: Vital signs: Vitals:   03/04/20 2000 03/05/20 0000 03/05/20 0401 03/05/20 0815  BP: (!) 120/48 (!) 127/48 (!) 139/56 (!) 122/52  Pulse: 79 76 76 77  Resp: '20 13 16 17  '$ Temp: 98.3 F (36.8 C) 97.8 F (36.6 C) 98.2 F (36.8 C) 97.9 F (36.6 C)  TempSrc: Oral Oral Oral Axillary  SpO2: 98% 98% 97% 96%  Weight:      Height:       Filed Weights   03/02/20 0342 03/03/20 0400 03/04/20 0419  Weight: 86.7 kg 87.7 kg 91.6 kg   Body mass index is 34.66 kg/m.   Gen Exam:Alert awake-not in any distress HEENT:atraumatic, normocephalic Chest: B/L clear to auscultation anteriorly CVS:S1S2 regular Abdomen:soft non tender, non  distended Extremities:no edema Neurology: Appears to have significant amount of debility/generalized weakness but seems to be moving all 4 extremities Skin: no rash  I have personally reviewed following labs and imaging studies  LABORATORY DATA: CBC: Recent Labs  Lab 02/28/20 0327 02/28/20 0327 02/29/20 0351 03/01/20 0717 03/02/20 0818 03/03/20 0515 03/05/20 0339  WBC 12.3*   < > 9.0 10.2 7.1 8.7 8.1  NEUTROABS 10.7*  --  7.4 8.2* 5.3 6.8  --   HGB 7.9*   < > 7.9* 8.2* 8.1* 8.6* 8.3*  HCT 27.2*   < >  27.5* 29.2* 30.2* 30.7* 29.8*  MCV 93.8   < > 92.3 95.1 97.1 97.5 97.1  PLT 202   < > 215 188 169 PLATELET CLUMPS NOTED ON SMEAR, UNABLE TO ESTIMATE 175   < > = values in this interval not displayed.    Basic Metabolic Panel: Recent Labs  Lab 02/28/20 0327 02/28/20 0327 02/29/20 0351 02/29/20 0351 03/01/20 0717 03/02/20 0818 03/03/20 0515 03/04/20 0416 03/05/20 0339  NA 132*   < > 135   < > 135 140 138 136 135  K 3.3*   < > 3.7   < > 3.6 4.1 3.7 3.4* 3.6  CL 93*   < > 96*   < > 96* 101 100 97* 96*  CO2 24   < > 28   < > '28 30 24 26 24  '$ GLUCOSE 135*   < > 115*   < > 163* 97 144* 115* 88  BUN 90*   < > 39*   < > 61* 36* 52* 71* 87*  CREATININE 2.29*   < > 1.38*   < > 1.85* 1.38* 1.68* 2.04* 2.25*  CALCIUM 8.9   < > 8.3*   < > 8.9 8.7* 9.1 9.2 9.5  MG 2.6*  --  2.3  --  2.6* 2.5* 2.5*  --   --   PHOS 4.4   < > 3.2   < > 5.0* 3.6 4.4 5.4* 6.1*   < > = values in this interval not displayed.    GFR: Estimated Creatinine Clearance: 25.2 mL/min (A) (by C-G formula based on SCr of 2.25 mg/dL (H)).  Liver Function Tests: Recent Labs  Lab 02/28/20 0327 02/28/20 0327 02/29/20 0351 02/29/20 0351 03/01/20 0717 03/02/20 0818 03/03/20 0515 03/04/20 0416 03/05/20 0339  AST 26  --  27  --  '23 27 29  '$ --   --   ALT 25  --  23  --  '22 22 25  '$ --   --   ALKPHOS 184*  --  168*  --  224* 207* 221*  --   --   BILITOT 0.5  --  0.3  --  0.6 0.2* 0.4  --   --   PROT 5.4*  --   6.0*  --  6.1* 5.9* 6.2*  --   --   ALBUMIN 2.2*   < > 2.4*   < > 2.4* 2.3* 2.4* 2.5* 2.4*   < > = values in this interval not displayed.   No results for input(s): LIPASE, AMYLASE in the last 168 hours. No results for input(s): AMMONIA in the last 168 hours.  Coagulation Profile: No results for input(s): INR, PROTIME in the last 168 hours.  Cardiac Enzymes: No results for input(s): CKTOTAL, CKMB, CKMBINDEX, TROPONINI in the last 168 hours.  BNP (last 3 results) No results for input(s): PROBNP in the last 8760 hours.  Lipid Profile: No results for input(s): CHOL, HDL, LDLCALC, TRIG, CHOLHDL, LDLDIRECT in the last 72 hours.  Thyroid Function Tests: No results for input(s): TSH, T4TOTAL, FREET4, T3FREE, THYROIDAB in the last 72 hours.  Anemia Panel: No results for input(s): VITAMINB12, FOLATE, FERRITIN, TIBC, IRON, RETICCTPCT in the last 72 hours.  Urine analysis:    Component Value Date/Time   COLORURINE YELLOW 02/25/2020 1600   APPEARANCEUR TURBID (A) 02/25/2020 1600   LABSPEC 1.021 02/25/2020 1600   PHURINE 5.0 02/25/2020 1600   GLUCOSEU NEGATIVE 02/25/2020 1600   HGBUR MODERATE (A) 02/25/2020 1600   BILIRUBINUR  NEGATIVE 02/25/2020 1600   KETONESUR NEGATIVE 02/25/2020 1600   PROTEINUR 100 (A) 02/25/2020 1600   NITRITE NEGATIVE 02/25/2020 1600   LEUKOCYTESUR MODERATE (A) 02/25/2020 1600    Sepsis Labs: Lactic Acid, Venous    Component Value Date/Time   LATICACIDVEN 0.7 02/24/2020 1823    MICROBIOLOGY: Recent Results (from the past 240 hour(s))  Culture, Urine     Status: None   Collection Time: 02/25/20  4:00 PM   Specimen: Urine, Random  Result Value Ref Range Status   Specimen Description URINE, RANDOM  Final   Special Requests NONE  Final   Culture   Final    NO GROWTH Performed at [facility], [place] [address] [place], [place] [address]    Report Status 02/26/2020 FINAL  Final  Body fluid culture (includes gram stain)     Status: None    Collection Time: 02/26/20  4:44 PM   Specimen: Pleural Fluid  Result Value Ref Range Status   Specimen Description FLUID RIGHT PLEURAL  Final   Special Requests NONE  Final   Gram Stain NO ORGANISMS SEEN NO WBC SEEN   Final   Culture   Final    NO GROWTH 3 DAYS Performed at [facility], [place] [address] [place], [place] [address]    Report Status 02/29/2020 FINAL  Final  MRSA PCR Screening     Status: Abnormal   Collection Time: 03/03/20 11:00 AM   Specimen: Nasopharyngeal  Result Value Ref Range Status   MRSA by PCR POSITIVE (A) NEGATIVE Final    Comment:        The GeneXpert MRSA Assay (FDA approved for NASAL specimens only), is one component of a comprehensive MRSA colonization surveillance program. It is not intended to diagnose MRSA infection nor to guide or monitor treatment for MRSA infections. RESULT CALLED TO, READ BACK BY AND VERIFIED WITH: Jefferson Fuel RN 14:35 03/03/20 ([email or web address]) Performed at [facility], [place] [address] [place], [place] [address]     RADIOLOGY STUDIES/RESULTS: No results found.   LOS: 60 days   Oren Binet, MD  [facility]    To contact the attending provider between 7A-7P or the covering provider during after hours 7P-7A, please log into the web site [email or web address] and access using universal [place] password for that web site. If you do not have the password, please call the hospital operator.  03/05/2020, 1:53 PM

## 2020-03-05 NOTE — Progress Notes (Signed)
Nephrology progress Note:   Admit: 01/05/2020 LOS: 60  Subjective:  Last HD on 4/26 with 2 kg UF.  (Didn't get the chair for that treatment as there wasn't a chair available.)  She had 250 mL UOP over 4/30 and 1 instance of UOP unmeasured.  Got lasix 80 mg IV once.    Review of systems:  States she is short of breath   Denies nausea or vomiting  No chest pain    04/29 0701 - 04/30 0700 In: 1137.3 [P.O.:240; NG/GT:797.3; IV Piggyback:100] Out: 250 [Urine:250]  Filed Weights   03/02/20 0342 03/03/20 0400 03/04/20 0419  Weight: 86.7 kg 87.7 kg 91.6 kg    Scheduled Meds: . acetaminophen (TYLENOL) oral liquid 160 mg/5 mL  1,000 mg Per Tube Q8H  . bisacodyl  10 mg Rectal Daily  . busPIRone  5 mg Per Tube BID  . calcium acetate  667 mg Oral TID WC  . camphor-menthol   Topical QID  . chlorhexidine gluconate (MEDLINE KIT)  15 mL Mouth Rinse BID  . Chlorhexidine Gluconate Cloth  6 each Topical Q0600  . Chlorhexidine Gluconate Cloth  6 each Topical Q0600  . darbepoetin (ARANESP) injection - DIALYSIS  200 mcg Subcutaneous Q Wed-1800  . feeding supplement (NEPRO CARB STEADY)  1,000 mL Per Tube Q24H  . feeding supplement (PRO-STAT SUGAR FREE 64)  30 mL Per Tube TID  . heparin injection (subcutaneous)  5,000 Units Subcutaneous Q8H  . insulin aspart  0-15 Units Subcutaneous Q4H  . insulin glargine  8 Units Subcutaneous BID  . lactulose  20 g Per Tube TID  . mupirocin ointment  1 application Nasal BID  . pantoprazole sodium  40 mg Per Tube BID  . PARoxetine  40 mg Per Tube Daily  . sodium chloride flush  10-40 mL Intracatheter Q12H   Continuous Infusions: . cefTRIAXone (ROCEPHIN)  IV 2 g (03/05/20 1000)  . ferric gluconate (FERRLECIT/NULECIT) IV 125 mg (03/03/20 1553)   PRN Meds:.feeding supplement (NEPRO CARB STEADY), guaiFENesin, metoprolol tartrate, ondansetron (ZOFRAN) IV, phenol, promethazine, sodium chloride flush  Current Labs: reviewed    Physical Exam:  Blood pressure (!)  122/52, pulse 77, temperature 97.9 F (36.6 C), temperature source Axillary, resp. rate 17, height '5\' 4"'$  (1.626 m), weight 91.6 kg, SpO2 96 %. Adult female in bed in NAD  NCAT   s1s2 no rub CTAB and unlabored; on supplemental oxygen Softly distended/nt Extr No sig LEE R IJ TDC in place  Assessment/Plan:  1. Dialysis dep't AKI had had previously been declared ESRD. on THS schedule previously.  CLIP completed MWF [place] MWF.  Will be using TDC until medically suitable for AVF/G  1. HD today - BUN up to 87 and oliguric.  Held on 4/28 2. Asked that CLIP be changed to AKI - had previously been declared ESRD 3. Needs HD in a recliner 4. Strict ins/outs - requested purewick 2. Debility; full scope of care currently; palliative working with patient/family 3. Hx Difficulting with tolerating HD, now using recliner 4. Anemia of CKD 1. ESA qThur 274mg aranesp 2. TSAT 5% 4/18, started Fe 5. Hx/o PEA arrest - supportive care per primary  6. Hx/o trach, decannulated 7. Lacks capacity for medical decision making per charting 8. 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   Recent Labs  Lab 03/03/20 0515 03/04/20 0416 03/05/20 0339  NA 138 136 135  K 3.7 3.4* 3.6  CL 100 97* 96*  CO2 '24 26 24  '$ GLUCOSE 144* 115* 88  BUN 52* 71* 87*  CREATININE 1.68* 2.04* 2.25*  CALCIUM 9.1 9.2 9.5  PHOS 4.4 5.4* 6.1*   Recent Labs  Lab 03/01/20 0717 03/01/20 0717 03/02/20 0818 03/03/20 0515 03/05/20 0339  WBC 10.2   < > 7.1 8.7 8.1  NEUTROABS 8.2*  --  5.3 6.8  --   HGB 8.2*   < > 8.1* 8.6* 8.3*  HCT 29.2*   < > 30.2* 30.7* 29.8*  MCV 95.1   < > 97.1 97.5 97.1  PLT 188   < > 169 PLATELET CLUMPS NOTED ON SMEAR, UNABLE TO ESTIMATE 175   < > = values in this interval not displayed.     Claudia Desanctis 03/05/2020 11:20 AM

## 2020-03-05 NOTE — Progress Notes (Signed)
Pt had a large brown/yellow stool oozing around the rectal tube, abd very hard and distended.  Stool appeared to be to thick to come through the tube.  Tube removed and pt had a very large stool and lots of flatuce.  The abd became less hard and more softer after this occurrence.

## 2020-03-05 NOTE — Progress Notes (Signed)
Received call from Dr. Royce Macadamia about need to update OP HD referral to reflect change from ESRD to AKI status. Navigator contacted Admissions Coordinator/Keri with [facility] to inform and faxed Renal Note from today, which reflects this change. Patient continues to be cleared from an OP HD standpoint.  Alphonzo Cruise, [place] Renal Navigator [phone]

## 2020-03-06 LAB — RENAL FUNCTION PANEL
Albumin: 2.2 g/dL — ABNORMAL LOW (ref 3.5–5.0)
Anion gap: 15 (ref 5–15)
BUN: 42 mg/dL — ABNORMAL HIGH (ref 8–23)
CO2: 23 mmol/L (ref 22–32)
Calcium: 8.3 mg/dL — ABNORMAL LOW (ref 8.9–10.3)
Chloride: 98 mmol/L (ref 98–111)
Creatinine, Ser: 1.28 mg/dL — ABNORMAL HIGH (ref 0.44–1.00)
GFR calc Af Amer: 49 mL/min — ABNORMAL LOW (ref >60)
GFR calc non Af Amer: 42 mL/min — ABNORMAL LOW (ref >60)
Glucose, Bld: 94 mg/dL (ref 70–99)
Phosphorus: 3 mg/dL (ref 2.5–4.6)
Potassium: 3.4 mmol/L — ABNORMAL LOW (ref 3.5–5.1)
Sodium: 136 mmol/L (ref 135–145)

## 2020-03-06 LAB — GLUCOSE, CAPILLARY
Glucose-Capillary: 85 mg/dL (ref 70–99)
Glucose-Capillary: 85 mg/dL (ref 70–99)
Glucose-Capillary: 92 mg/dL (ref 70–99)
Glucose-Capillary: 102 mg/dL — ABNORMAL HIGH (ref 70–99)
Glucose-Capillary: 106 mg/dL — ABNORMAL HIGH (ref 70–99)

## 2020-03-06 MED ORDER — POLYETHYLENE GLYCOL 3350 17 G PO PACK
17.0000 g | PACK | Freq: Every day | ORAL | Status: DC
  Administered 2020-03-06 – 2020-03-07 (×2): 17 g via ORAL
  Filled 2020-03-06 (×2): qty 1

## 2020-03-06 MED ORDER — HEPARIN SODIUM (PORCINE) 1000 UNIT/ML IJ SOLN
INTRAMUSCULAR | Status: AC
  Administered 2020-03-06: 3800 [IU]
  Filled 2020-03-06: qty 4

## 2020-03-06 MED ORDER — FUROSEMIDE 10 MG/ML IJ SOLN
80.0000 mg | Freq: Once | INTRAMUSCULAR | Status: AC
  Administered 2020-03-06: 80 mg via INTRAVENOUS
  Filled 2020-03-06: qty 8

## 2020-03-06 MED ORDER — POTASSIUM CHLORIDE CRYS ER 20 MEQ PO TBCR
40.0000 meq | EXTENDED_RELEASE_TABLET | Freq: Once | ORAL | Status: AC
  Administered 2020-03-06: 40 meq via ORAL
  Filled 2020-03-06: qty 2

## 2020-03-06 NOTE — Progress Notes (Signed)
Nephrology progress Note:   Admit: 01/05/2020 LOS: 61  Subjective:  Last HD on 4/30 with 1.5 kg.  She had 250 mL UOP over 4/29 and 1 instance of UOP unmeasured.  No urine charted for 4/30.  RN is going to apply a purewick today.  She tried to wean pt off of oxygen and she was 86% on room air.   Review of systems: States she is short of breath   Denies nausea or vomiting  No chest pain    04/30 0701 - 05/01 0700 In: 280 [I.V.:10] Out: 1500   Filed Weights   03/04/20 0419 03/05/20 2346 03/06/20 0300  Weight: 91.6 kg 90.9 kg 88.9 kg    Scheduled Meds: . acetaminophen (TYLENOL) oral liquid 160 mg/5 mL  1,000 mg Per Tube Q8H  . bisacodyl  10 mg Rectal Daily  . busPIRone  5 mg Per Tube BID  . calcium acetate  667 mg Oral TID WC  . camphor-menthol   Topical QID  . chlorhexidine gluconate (MEDLINE KIT)  15 mL Mouth Rinse BID  . Chlorhexidine Gluconate Cloth  6 each Topical Q0600  . Chlorhexidine Gluconate Cloth  6 each Topical Q0600  . darbepoetin (ARANESP) injection - DIALYSIS  200 mcg Subcutaneous Q Wed-1800  . feeding supplement (NEPRO CARB STEADY)  1,000 mL Per Tube Q24H  . feeding supplement (PRO-STAT SUGAR FREE 64)  30 mL Per Tube TID  . heparin injection (subcutaneous)  5,000 Units Subcutaneous Q8H  . insulin aspart  0-15 Units Subcutaneous Q4H  . insulin glargine  8 Units Subcutaneous BID  . lactulose  20 g Per Tube TID  . mupirocin ointment  1 application Nasal BID  . pantoprazole sodium  40 mg Per Tube BID  . PARoxetine  40 mg Per Tube Daily  . sodium chloride flush  10-40 mL Intracatheter Q12H   Continuous Infusions: . ferric gluconate (FERRLECIT/NULECIT) IV 125 mg (03/06/20 0145)   PRN Meds:.feeding supplement (NEPRO CARB STEADY), guaiFENesin, metoprolol tartrate, ondansetron (ZOFRAN) IV, phenol, promethazine, sodium chloride flush  Current Labs: reviewed    Physical Exam:  Blood pressure 140/65, pulse 77, temperature 97.9 F (36.6 C), temperature source  Axillary, resp. rate 18, height '5\' 4"'$  (1.626 m), weight 88.9 kg, SpO2 (!) 88 %. Adult female in bed in NAD  NCAT   s1s2 no rub Basilar crackles; unlabored; on supplemental oxygen 1 liter Softly distended/nt Extr No sig LEE R IJ TDC in place  Assessment/Plan:  1. 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 on THS schedule previously.  CLIP completed MWF [place].  Will be using TDC until medically suitable for AVF/G (not also assessing for recovery) 1. No acute need for HD today - had on 4/26 and 4/30 (4/30 tx was for clearance and volume - BUN up to 87 and oliguric) 2. Assess needs daily.  Possible for HD on Monday, 5/3 3. Lasix 80 mg IV once today  4. Asked that CLIP be changed to AKI - had previously been declared ESRD 5. Needs HD in a recliner 6. Strict ins/outs - requested purewick  2. Debility; full scope of care currently; palliative working with patient/family 3. Hx Difficulting with tolerating HD, now using recliner 4. Anemia of CKD 1. ESA qThur 241mg aranesp 2. TSAT 5% 4/18, started Fe 5. Hx/o PEA arrest - supportive care per primary  6. Hx/o trach, decannulated 7. Lacks capacity for medical decision making per charting 8. Hypercapneic RF on BiPAP per CCM/TRH 9. CKD-BMD: trend phos -  continue phoslo   Recent Labs  Lab 03/04/20 0416 03/05/20 0339 03/06/20 0508  NA 136 135 136  K 3.4* 3.6 3.4*  CL 97* 96* 98  CO2 '26 24 23  '$ GLUCOSE 115* 88 94  BUN 71* 87* 42*  CREATININE 2.04* 2.25* 1.28*  CALCIUM 9.2 9.5 8.3*  PHOS 5.4* 6.1* 3.0   Recent Labs  Lab 03/01/20 0717 03/01/20 0717 03/02/20 0818 03/03/20 0515 03/05/20 0339  WBC 10.2   < > 7.1 8.7 8.1  NEUTROABS 8.2*  --  5.3 6.8  --   HGB 8.2*   < > 8.1* 8.6* 8.3*  HCT 29.2*   < > 30.2* 30.7* 29.8*  MCV 95.1   < > 97.1 97.5 97.1  PLT 188   < > 169 PLATELET CLUMPS NOTED ON SMEAR, UNABLE TO ESTIMATE 175   < > = values in this interval not displayed.     Claudia Desanctis 03/06/2020 10:05  AM

## 2020-03-06 NOTE — Plan of Care (Signed)
  Problem: Clinical Measurements: Goal: Ability to maintain clinical measurements within normal limits will improve Outcome: Progressing   Problem: Clinical Measurements: Goal: Respiratory complications will improve Outcome: Progressing   Problem: Clinical Measurements: Goal: Cardiovascular complication will be avoided Outcome: Progressing   

## 2020-03-06 NOTE — Plan of Care (Signed)
  Problem: Education: Goal: Knowledge of General Education information will improve Description: Including pain rating scale, medication(s)/side effects and non-pharmacologic comfort measures Outcome: Progressing   Problem: Health Behavior/Discharge Planning: Goal: Ability to manage health-related needs will improve Outcome: Progressing   Problem: Clinical Measurements: Goal: Ability to maintain clinical measurements within normal limits will improve Outcome: Progressing Goal: Will remain free from infection Outcome: Progressing Goal: Diagnostic test results will improve Outcome: Progressing   Problem: Nutrition: Goal: Adequate nutrition will be maintained Outcome: Progressing   Problem: Coping: Goal: Level of anxiety will decrease Outcome: Progressing   Problem: Pain Managment: Goal: General experience of comfort will improve Outcome: Progressing   Problem: Safety: Goal: Ability to remain free from injury will improve Outcome: Progressing

## 2020-03-06 NOTE — Progress Notes (Signed)
PROGRESS NOTE        PATIENT DETAILS Name: Bailey Cox Age: 74 y.o. Sex: female Date of Birth: 08/05/1946 Admit Date: 01/05/2020 Admitting Physician Kandis Cocking, MD PYK:DXIPJAS, No Pcp Per  Brief Narrative: 74 year old female with history of cholecystitis status post cholecystectomy in 9/20 at [facility]. She was discharged to SNF but rehospitalized to [facility] and had bile duct stent. Discharged back to SNF but readmitted with Ogilive syndrome. Hospital course complicated by fluid overload and transferred to [facility] for dialysis. At [facility], developed respiratory failure requiring vent support. She also suffered a cardiac arrest during EGD. She underwent trach,she was eventually discharged to [facility] on 3/1 after prolonged hospitalization at [facility] but deemed too sick, and sent to [facility] due to fluid overload and acidosis.  Patient was initially admitted to ICU. Required CRRT 3/9-3/12. Transfer to floor on 3/14. She pulled her trach. Now on oxygen by nasal cannula. She is on Intermitted HD via [place]. Has refused dialysis at times but deemed to lack capacity. Family wants full scope of care including full code and dialysis. She has been noncompliant with care and hemodialysis but lacks capacity. Ethics committee meeting held on 4/6. The plan is to continue full scope of care per family's wish. Finally shetolerated HD in recliner chair on 4/19 without sedating medications. She will be discharged to SNF once she had outpatient HD spot andcontinues to tolerate HD on recliner chair.  She's been started on lactulose for elevated ammonia with concern for hepatic encephalopathy as well as nightly bipap for acute hypercapneic resp failure with altered mental status.    Subjective: Sleeping comfortably-Per nursing staff-tolerated BiPAP last night.  Assessment/Plan: Acute metabolic encephalopathy: Multifactorial etiology-including AKI,  hypercarbia, hospital/ICU delirium, hyperammonemia, pneumonia.  Encephalopathy is improved-although waxing and waning at times.  TSH/vitamin B12 within normal limits-RPR nonreactive.  Continue supportive care.  Septic shock secondary to UTI and PNA: Sepsis physiology has resolved-has completed a course of antimicrobial therapy.  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  Continue BiPAP nightly-wean oxygen as able.  Avoid sedating medications like narcotics/benzodiazepines as much as possible.  Pseudomonas aeruginosa/aspiration pneumonia with right-sided parapneumonic effusion: Has completed multiple courses of antibiotics-see below.  Patient is s/p  thoracocentesis by PCCM on 4/22-plans are to continue with Rocephin until 5/1.  Maintain aspiration precautions.  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  Noncompliant with HD but lacks capacity.  DM-2 with hyperglycemia ( A1c 5.7%): CBGs stable-continue Lantus 8 units twice daily and SSI.  CBG (last 3)  Recent Labs    03/06/20 0429 03/06/20 0754 03/06/20 1142  GLUCAP 85 85 92    Essential hypertension: BP controlled-not on scheduled antihypertensives-remains on as needed metoprolol.    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  Hemoglobin stable-follow periodically.   Hypokalemia: Replete and recheck  Hyponatremia: Resolved.  Anxiety/depression/agitation: Stable this morning-calm-quiet and answered most of my questions appropriately.  Continue Paxil and BuSpar.  Maintain delirium precautions.  Due to lethargy/sedation-no longer on Haldol and Seroquel.  GERD: Continue PPI  Thrombocytopenia:  resolved.  Generalized weakness/debility: Secondary to acute illness-Per prior note--not able to participate in therapy. Requiring  mechanical lift to chair.  Chronic pain:-Scheduled Tylenol 1000 mg 3 times  daily.  No longer onoxycodone and reduced IV fentanyl due to AMS.  Goals of care/Ethical issues-complex medical issue with guarded prognosis. Patient refused HD previously but deemed to lack capacity by psych. Daughter wishes her to be full code with full scope of care including dialysis.Continued on HD with the help of medications and family.Palliative care following.Ethics involved previously.   Nutrition Problem: Nutrition Problem: Inadequate oral intake Etiology: poor appetite, acute illness Signs/Symptoms: meal completion < 25% Interventions: Tube feeding, Nepro shake  Obesity: Estimated body mass index is 33.64 kg/m as calculated from the following:   Height as of this encounter: '5\' 4"'$  (1.626 m).   Weight as of this encounter: 88.9 kg.   DVT prophylaxis: SQ heparin  Diet: Diet Order            DIET DYS 2 Room service appropriate? No; Fluid consistency: Thin  Diet effective now               Code Status: Full code   Family Communication: Spoke to daughter on 5/1  Procedures : 3/5 IJ vas cath-> 4/22 right-sided thoracocentesis>>  Significant studies: Echo 10/24/2019 > LVEF 55%, Grade 1 DD. RV systolic function normal.  Echo 3/2: LVEF 65-70%, grade II dysfunction. RVSP  Consults: Nephrology PCCM Psychiatry Palliative care Interventional radiology Ethics  Disposition Plan:  SNF when ready for discharge Status is: Inpatient  Remains inpatient appropriate because:Inpatient level of care appropriate due to severity of illness   Dispo: The patient is from: [facility]              Anticipated d/c is to: SNF              Anticipated d/c date is: > 3 days              Patient currently is not medically stable to d/c.   Antimicrobial agents: Anti-infectives (From admission, onward)   Start     Dose/Rate Route Frequency Ordered Stop   02/28/20 1500  cefTRIAXone (ROCEPHIN) 2 g in  sodium chloride 0.9 % 100 mL IVPB     2 g 200 mL/hr over 30 Minutes Intravenous Every 24 hours 02/28/20 1404 03/05/20 2359   02/26/20 1300  cefTRIAXone (ROCEPHIN) 1 g in sodium chloride 0.9 % 100 mL IVPB  Status:  Discontinued     1 g 200 mL/hr over 30 Minutes Intravenous Daily 02/26/20 1201 02/27/20 0930   02/16/20 1230  cefTRIAXone (ROCEPHIN) 2 g in sodium chloride 0.9 % 100 mL IVPB     2 g 200 mL/hr over 30 Minutes Intravenous Every 24 hours 02/16/20 1127 02/20/20 0941   02/16/20 1230  azithromycin (ZITHROMAX) 500 mg in sodium chloride 0.9 % 250 mL IVPB     500 mg 250 mL/hr over 60 Minutes Intravenous Every 24 hours 02/16/20 1127 02/20/20 1140   01/10/20 0930  vancomycin (VANCOCIN) IVPB 1000 mg/200 mL premix  Status:  Discontinued     1,000 mg 200 mL/hr over 60 Minutes Intravenous  Once 01/10/20 0915 01/13/20 0623   01/06/20 2200  ceFEPIme (MAXIPIME) 2 g in sodium chloride 0.9 % 100 mL IVPB     2 g 200 mL/hr over 30 Minutes Intravenous Every 24 hours 01/05/20 2251 01/11/20 2234   01/05/20 2200  ceFEPIme (MAXIPIME) 2 g in sodium chloride 0.9 % 100 mL IVPB     2 g 200 mL/hr over 30 Minutes Intravenous  Once 01/05/20 2148 01/06/20 0015   01/05/20 2200  metroNIDAZOLE (FLAGYL) IVPB 500 mg  500 mg 100 mL/hr over 60 Minutes Intravenous  Once 01/05/20 2148 01/06/20 0015   01/05/20 2200  vancomycin (VANCOCIN) IVPB 1000 mg/200 mL premix  Status:  Discontinued     1,000 mg 200 mL/hr over 60 Minutes Intravenous  Once 01/05/20 2148 01/05/20 2151   01/05/20 2200  vancomycin (VANCOREADY) IVPB 2000 mg/400 mL  Status:  Discontinued     2,000 mg 200 mL/hr over 120 Minutes Intravenous  Once 01/05/20 2151 01/05/20 2223       Time spent: 25 minutes-Greater than 50% of this time was spent in counseling, explanation of diagnosis, planning of further management, and coordination of care.  MEDICATIONS: Scheduled Meds: . acetaminophen (TYLENOL) oral liquid 160 mg/5 mL  1,000 mg Per Tube Q8H  .  bisacodyl  10 mg Rectal Daily  . busPIRone  5 mg Per Tube BID  . calcium acetate  667 mg Oral TID WC  . camphor-menthol   Topical QID  . chlorhexidine gluconate (MEDLINE KIT)  15 mL Mouth Rinse BID  . Chlorhexidine Gluconate Cloth  6 each Topical Q0600  . Chlorhexidine Gluconate Cloth  6 each Topical Q0600  . darbepoetin (ARANESP) injection - DIALYSIS  200 mcg Subcutaneous Q Wed-1800  . feeding supplement (NEPRO CARB STEADY)  1,000 mL Per Tube Q24H  . feeding supplement (PRO-STAT SUGAR FREE 64)  30 mL Per Tube TID  . heparin injection (subcutaneous)  5,000 Units Subcutaneous Q8H  . insulin aspart  0-15 Units Subcutaneous Q4H  . insulin glargine  8 Units Subcutaneous BID  . lactulose  20 g Per Tube TID  . mupirocin ointment  1 application Nasal BID  . pantoprazole sodium  40 mg Per Tube BID  . PARoxetine  40 mg Per Tube Daily  . sodium chloride flush  10-40 mL Intracatheter Q12H   Continuous Infusions: . ferric gluconate (FERRLECIT/NULECIT) IV 125 mg (03/06/20 0145)   PRN Meds:.feeding supplement (NEPRO CARB STEADY), guaiFENesin, metoprolol tartrate, ondansetron (ZOFRAN) IV, phenol, promethazine, sodium chloride flush   PHYSICAL EXAM: Vital signs: Vitals:   03/06/20 0426 03/06/20 0800 03/06/20 0922 03/06/20 1104  BP: 93/60 140/65  (!) 147/63  Pulse:  77  83  Resp:  18  20  Temp: 99.2 F (37.3 C) 97.9 F (36.6 C)  98.1 F (36.7 C)  TempSrc: Oral Axillary  Oral  SpO2:  100% (!) 88% 94%  Weight:      Height:       Filed Weights   03/04/20 0419 03/05/20 2346 03/06/20 0300  Weight: 91.6 kg 90.9 kg 88.9 kg   Body mass index is 33.64 kg/m.   Gen Exam:Alert awake-not in any distress HEENT:atraumatic, normocephalic Chest: B/L clear to auscultation anteriorly CVS:S1S2 regular Abdomen:soft non tender, non distended Extremities:no edema Neurology: Appears to have significant amount of debility/generalized weakness but seems to be moving all 4 extremities Skin: no rash  I  have personally reviewed following labs and imaging studies  LABORATORY DATA: CBC: Recent Labs  Lab 02/29/20 0351 03/01/20 0717 03/02/20 0818 03/03/20 0515 03/05/20 0339  WBC 9.0 10.2 7.1 8.7 8.1  NEUTROABS 7.4 8.2* 5.3 6.8  --   HGB 7.9* 8.2* 8.1* 8.6* 8.3*  HCT 27.5* 29.2* 30.2* 30.7* 29.8*  MCV 92.3 95.1 97.1 97.5 97.1  PLT 215 188 169 PLATELET CLUMPS NOTED ON SMEAR, UNABLE TO ESTIMATE 226    Basic Metabolic Panel: Recent Labs  Lab 02/29/20 0351 02/29/20 0351 03/01/20 3335 03/01/20 4562 03/02/20 0818 03/03/20 0515 03/04/20 0416 03/05/20 5638 03/06/20 9373  NA 135   < > 135   < > 140 138 136 135 136  K 3.7   < > 3.6   < > 4.1 3.7 3.4* 3.6 3.4*  CL 96*   < > 96*   < > 101 100 97* 96* 98  CO2 28   < > 28   < > '30 24 26 24 23  '$ GLUCOSE 115*   < > 163*   < > 97 144* 115* 88 94  BUN 39*   < > 61*   < > 36* 52* 71* 87* 42*  CREATININE 1.38*   < > 1.85*   < > 1.38* 1.68* 2.04* 2.25* 1.28*  CALCIUM 8.3*   < > 8.9   < > 8.7* 9.1 9.2 9.5 8.3*  MG 2.3  --  2.6*  --  2.5* 2.5*  --   --   --   PHOS 3.2   < > 5.0*   < > 3.6 4.4 5.4* 6.1* 3.0   < > = values in this interval not displayed.    GFR: Estimated Creatinine Clearance: 43.5 mL/min (A) (by C-G formula based on SCr of 1.28 mg/dL (H)).  Liver Function Tests: Recent Labs  Lab 02/29/20 0351 02/29/20 0351 03/01/20 0717 03/01/20 0717 03/02/20 0818 03/03/20 0515 03/04/20 0416 03/05/20 0339 03/06/20 0508  AST 27  --  23  --  27 29  --   --   --   ALT 23  --  22  --  22 25  --   --   --   ALKPHOS 168*  --  224*  --  207* 221*  --   --   --   BILITOT 0.3  --  0.6  --  0.2* 0.4  --   --   --   PROT 6.0*  --  6.1*  --  5.9* 6.2*  --   --   --   ALBUMIN 2.4*   < > 2.4*   < > 2.3* 2.4* 2.5* 2.4* 2.2*   < > = values in this interval not displayed.   No results for input(s): LIPASE, AMYLASE in the last 168 hours. No results for input(s): AMMONIA in the last 168 hours.  Coagulation Profile: No results for input(s):  INR, PROTIME in the last 168 hours.  Cardiac Enzymes: No results for input(s): CKTOTAL, CKMB, CKMBINDEX, TROPONINI in the last 168 hours.  BNP (last 3 results) No results for input(s): PROBNP in the last 8760 hours.  Lipid Profile: No results for input(s): CHOL, HDL, LDLCALC, TRIG, CHOLHDL, LDLDIRECT in the last 72 hours.  Thyroid Function Tests: No results for input(s): TSH, T4TOTAL, FREET4, T3FREE, THYROIDAB in the last 72 hours.  Anemia Panel: No results for input(s): VITAMINB12, FOLATE, FERRITIN, TIBC, IRON, RETICCTPCT in the last 72 hours.  Urine analysis:    Component Value Date/Time   COLORURINE YELLOW 02/25/2020 1600   APPEARANCEUR TURBID (A) 02/25/2020 1600   LABSPEC 1.021 02/25/2020 1600   PHURINE 5.0 02/25/2020 1600   GLUCOSEU NEGATIVE 02/25/2020 1600   HGBUR MODERATE (A) 02/25/2020 1600   BILIRUBINUR NEGATIVE 02/25/2020 1600   KETONESUR NEGATIVE 02/25/2020 1600   PROTEINUR 100 (A) 02/25/2020 1600   NITRITE NEGATIVE 02/25/2020 1600   LEUKOCYTESUR MODERATE (A) 02/25/2020 1600    Sepsis Labs: Lactic Acid, Venous    Component Value Date/Time   LATICACIDVEN 0.7 02/24/2020 1823    MICROBIOLOGY: Recent Results (from the past 240 hour(s))  Culture, Urine  Status: None   Collection Time: 02/25/20  4:00 PM   Specimen: Urine, Random  Result Value Ref Range Status   Specimen Description URINE, RANDOM  Final   Special Requests NONE  Final   Culture   Final    NO GROWTH Performed at [facility], [address], [place], [place] [address]    Report Status 02/26/2020 FINAL  Final  Body fluid culture (includes gram stain)     Status: None   Collection Time: 02/26/20  4:44 PM   Specimen: Pleural Fluid  Result Value Ref Range Status   Specimen Description FLUID RIGHT PLEURAL  Final   Special Requests NONE  Final   Gram Stain NO ORGANISMS SEEN NO WBC SEEN   Final   Culture   Final    NO GROWTH 3 DAYS Performed at [facility], [place] [address], [place], [place] [address]    Report Status 02/29/2020 FINAL  Final  MRSA PCR Screening     Status: Abnormal   Collection Time: 03/03/20 11:00 AM   Specimen: Nasopharyngeal  Result Value Ref Range Status   MRSA by PCR POSITIVE (A) NEGATIVE Final    Comment:        The GeneXpert MRSA Assay (FDA approved for NASAL specimens only), is one component of a comprehensive MRSA colonization surveillance program. It is not intended to diagnose MRSA infection nor to guide or monitor treatment for MRSA infections. RESULT CALLED TO, READ BACK BY AND VERIFIED WITH: Jefferson Fuel RN 14:35 03/03/20 ([email or web address]) Performed at [facility], [place] [address] [place], [place] [address]     RADIOLOGY STUDIES/RESULTS: No results found.   LOS: 61 days   Oren Binet, MD  [facility]    To contact the attending provider between 7A-7P or the covering provider during after hours 7P-7A, please log into the web site [email or web address] and access using universal  password for that web site. If you do not have the password, please call the hospital operator.  03/06/2020, 3:28 PM

## 2020-03-07 LAB — GLUCOSE, CAPILLARY
Glucose-Capillary: 119 mg/dL — ABNORMAL HIGH (ref 70–99)
Glucose-Capillary: 137 mg/dL — ABNORMAL HIGH (ref 70–99)
Glucose-Capillary: 88 mg/dL (ref 70–99)
Glucose-Capillary: 93 mg/dL (ref 70–99)
Glucose-Capillary: 95 mg/dL (ref 70–99)
Glucose-Capillary: 97 mg/dL (ref 70–99)

## 2020-03-07 MED ORDER — LACTULOSE 10 GM/15ML PO SOLN
20.0000 g | Freq: Two times a day (BID) | ORAL | Status: DC
  Administered 2020-03-07 – 2020-03-08 (×2): 20 g
  Filled 2020-03-07 (×2): qty 30

## 2020-03-07 MED ORDER — FUROSEMIDE 10 MG/ML IJ SOLN
80.0000 mg | Freq: Once | INTRAMUSCULAR | Status: AC
  Administered 2020-03-07: 80 mg via INTRAVENOUS
  Filled 2020-03-07: qty 8

## 2020-03-07 NOTE — Progress Notes (Signed)
Pt reassessed and no change from earlier assessment.

## 2020-03-07 NOTE — Progress Notes (Signed)
Nephrology progress Note:   Admit: 01/05/2020 LOS: 62  Subjective:  Last HD on 4/30 with 1.5 kg.  She had 100 mL UOP over 5/1 and 2 unmeasured voids.     Review of systems: Pt states she is short of breath   Denies nausea or vomiting  No chest pain    05/01 0701 - 05/02 0700 In: 1265.7 [P.O.:300; I.V.:10; NG/GT:745.7] Out: 100 [Urine:100]  Filed Weights   03/05/20 2346 03/06/20 0300 03/07/20 0500  Weight: 90.9 kg 88.9 kg 89.2 kg    Scheduled Meds: . acetaminophen (TYLENOL) oral liquid 160 mg/5 mL  1,000 mg Per Tube Q8H  . bisacodyl  10 mg Rectal Daily  . busPIRone  5 mg Per Tube BID  . calcium acetate  667 mg Oral TID WC  . camphor-menthol   Topical QID  . chlorhexidine gluconate (MEDLINE KIT)  15 mL Mouth Rinse BID  . Chlorhexidine Gluconate Cloth  6 each Topical Q0600  . Chlorhexidine Gluconate Cloth  6 each Topical Q0600  . darbepoetin (ARANESP) injection - DIALYSIS  200 mcg Subcutaneous Q Wed-1800  . feeding supplement (NEPRO CARB STEADY)  1,000 mL Per Tube Q24H  . feeding supplement (PRO-STAT SUGAR FREE 64)  30 mL Per Tube TID  . heparin injection (subcutaneous)  5,000 Units Subcutaneous Q8H  . insulin aspart  0-15 Units Subcutaneous Q4H  . insulin glargine  8 Units Subcutaneous BID  . lactulose  20 g Per Tube TID  . mupirocin ointment  1 application Nasal BID  . pantoprazole sodium  40 mg Per Tube BID  . PARoxetine  40 mg Per Tube Daily  . polyethylene glycol  17 g Oral Daily  . sodium chloride flush  10-40 mL Intracatheter Q12H   Continuous Infusions: . ferric gluconate (FERRLECIT/NULECIT) IV 125 mg (03/06/20 0145)   PRN Meds:.feeding supplement (NEPRO CARB STEADY), guaiFENesin, metoprolol tartrate, ondansetron (ZOFRAN) IV, phenol, promethazine, sodium chloride flush  Current Labs: reviewed    Physical Exam:  Blood pressure (!) 153/61, pulse 72, temperature 97.8 F (36.6 C), temperature source Axillary, resp. rate 15, height '5\' 4"'$  (1.626 m), weight 89.2 kg,  SpO2 100 %. Adult female in bed in NAD  NCAT   s1s2 no rub Basilar crackles; unlabored; on supplemental oxygen 1 liter Soft but distended/nt Extr no lower extremity edema  R IJ TDC in place  Assessment/Plan:  1. 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 on THS schedule previously.  CLIP completed MWF [place].  Will be using TDC until medically suitable for AVF/G (not also assessing for recovery) 1. No acute need for HD today - had on 4/26 and 4/30 (4/30 tx was for clearance and volume - BUN up to 87 and oliguric) 2. Assess needs HD daily - plan to hold on 5/3 for now  3. Lasix 80 mg IV once today  4. Renal panel and CBC in AM  5. Asked that CLIP be changed to AKI - had previously been declared ESRD 6. Needs HD in a recliner 7. Strict ins/outs and have requested purewick  2. Debility; full scope of care currently; palliative working with patient/family 3. Hx Difficulting with tolerating HD, now using recliner 4. Anemia of CKD 1. ESA qThur 284mg aranesp 2. TSAT 5% 4/18, started Fe 5. Hx/o PEA arrest - supportive care per primary  6. Hx/o trach, decannulated 7. Lacks capacity for medical decision making per charting.  Note hx ethics committee meeting per charting and plan for continued scope of care  as per her family 27. Hypercapneic RF on BiPAP per primary  9. CKD-BMD: trend phos - continue phoslo   Recent Labs  Lab 03/04/20 0416 03/05/20 0339 03/06/20 0508  NA 136 135 136  K 3.4* 3.6 3.4*  CL 97* 96* 98  CO2 '26 24 23  '$ GLUCOSE 115* 88 94  BUN 71* 87* 42*  CREATININE 2.04* 2.25* 1.28*  CALCIUM 9.2 9.5 8.3*  PHOS 5.4* 6.1* 3.0   Recent Labs  Lab 03/01/20 0717 03/01/20 0717 03/02/20 0818 03/03/20 0515 03/05/20 0339  WBC 10.2   < > 7.1 8.7 8.1  NEUTROABS 8.2*  --  5.3 6.8  --   HGB 8.2*   < > 8.1* 8.6* 8.3*  HCT 29.2*   < > 30.2* 30.7* 29.8*  MCV 95.1   < > 97.1 97.5 97.1  PLT 188   < > 169 PLATELET CLUMPS NOTED ON SMEAR, UNABLE TO ESTIMATE  175   < > = values in this interval not displayed.     Claudia Desanctis 03/07/2020 11:05 AM

## 2020-03-07 NOTE — Progress Notes (Signed)
PROGRESS NOTE        PATIENT DETAILS Name: Bailey Cox Age: 75 y.o. Sex: female Date of Birth: 28-Jul-1946 Admit Date: 01/05/2020 Admitting Physician Kandis Cocking, MD CXK:GYJEHUD, No Pcp Per  Brief Narrative: 74 year old female with history of cholecystitis status post cholecystectomy in 9/20 at [facility]. She was discharged to SNF but rehospitalized to [facility] and had bile duct stent. Discharged back to SNF but readmitted with Ogilive syndrome. Hospital course complicated by fluid overload and transferred to [facility] for dialysis. At [facility], developed respiratory failure requiring vent support. She also suffered a cardiac arrest during EGD. She underwent trach,she was eventually discharged to [facility] on 3/1 after prolonged hospitalization at [facility] but deemed too sick, and sent to [facility] due to fluid overload and acidosis.  Patient was initially admitted to ICU. Required CRRT 3/9-3/12. Transfer to floor on 3/14. She pulled her trach. Now on oxygen by nasal cannula. She is on Intermitted HD via [place]. Has refused dialysis at times but deemed to lack capacity. Family wants full scope of care including full code and dialysis. She has been noncompliant with care and hemodialysis but lacks capacity. Ethics committee meeting held on 4/6. The plan is to continue full scope of care per family's wish. Finally shetolerated HD in recliner chair on 4/19 without sedating medications. She will be discharged to SNF once she had outpatient HD spot andcontinues to tolerate HD on recliner chair.  She's been started on lactulose for elevated ammonia with concern for hepatic encephalopathy as well as nightly bipap for acute hypercapneic resp failure with altered mental status.    Subjective: No major events overnight-remains essentially the same.  Denies any chest pain or shortness of breath.  Continues to have some mild upper abdominal pain.  Had BM  yesterday-no nausea or vomiting.  Tolerating oral intake.  Assessment/Plan: Acute metabolic encephalopathy: Multifactorial etiology-including AKI, hypercarbia, hospital/ICU delirium, hyperammonemia, pneumonia.  Encephalopathy is improved-although waxing and waning at times.  TSH/vitamin B12 within normal limits-RPR nonreactive.  Continue supportive care.  Septic shock secondary to UTI and PNA: Sepsis physiology has resolved-has completed a course of antimicrobial therapy.  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  Continue BiPAP nightly-wean oxygen as able.  Avoid sedating medications like narcotics/benzodiazepines as much as possible.  Pseudomonas aeruginosa/aspiration pneumonia with right-sided parapneumonic effusion: Has completed multiple courses of antibiotics-see below.  Patient is s/p  thoracocentesis by PCCM on 4/22-plans are to continue with Rocephin until 5/1.  Maintain aspiration precautions.  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  Noncompliant with HD but lacks capacity.  DM-2 with hyperglycemia ( A1c 5.7%): CBGs stable-continue Lantus 8 units twice daily and SSI.  CBG (last 3)  Recent Labs    03/07/20 0700 03/07/20 0735 03/07/20 1215  GLUCAP 93 97 119*    Essential hypertension: BP controlled-not on scheduled antihypertensives-remains on as needed metoprolol.    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  Hemoglobin stable-follow periodically.   Hypokalemia: Replete and recheck  Hyponatremia: Resolved.  Anxiety/depression/agitation: Stable this morning-calm-quiet and answered most of my questions appropriately.  Continue Paxil and BuSpar.  Maintain delirium precautions.  Due to lethargy/sedation-no longer on Haldol and Seroquel.  GERD: Continue PPI   Thrombocytopenia:  resolved.  Generalized weakness/debility: Secondary to acute illness-Per prior note--not able to participate in therapy. Requiring mechanical lift to chair.  Chronic pain:-Scheduled Tylenol 1000 mg 3 times daily.  No longer onoxycodone and reduced IV fentanyl due to AMS.  Goals of care/Ethical issues-complex medical issue with guarded prognosis. Patient refused HD previously but deemed to lack capacity by psych. Daughter wishes her to be full code with full scope of care including dialysis.Continued on HD with the help of medications and family.Palliative care following.Ethics involved previously.   Nutrition Problem: Nutrition Problem: Inadequate oral intake Etiology: poor appetite, acute illness Signs/Symptoms: meal completion < 25% Interventions: Tube feeding, Nepro shake  Obesity: Estimated body mass index is 33.76 kg/m as calculated from the following:   Height as of this encounter: '5\' 4"'$  (1.626 m).   Weight as of this encounter: 89.2 kg.   DVT prophylaxis: SQ heparin  Diet: Diet Order            DIET DYS 2 Room service appropriate? No; Fluid consistency: Thin  Diet effective now               Code Status: Full code   Family Communication: Spoke to daughter on 5/1-we will update on 5/3 is no major issues overnight.  Procedures : 3/5 IJ vas cath-> 4/22 right-sided thoracocentesis>>  Significant studies: Echo 10/24/2019 > LVEF 55%, Grade 1 DD. RV systolic function normal.  Echo 3/2: LVEF 65-70%, grade II dysfunction. RVSP  Consults: Nephrology PCCM Psychiatry Palliative care Interventional radiology Ethics  Disposition Plan:  SNF when ready for discharge-awaiting bed. Status is: Inpatient  Remains inpatient appropriate because:Inpatient level of care appropriate due to severity of illness   Dispo: The patient is from: [facility]              Anticipated d/c is to: SNF              Anticipated d/c date is: > 3 days               Patient currently is not medically stable to d/c.   Antimicrobial agents: Anti-infectives (From admission, onward)   Start     Dose/Rate Route Frequency Ordered Stop   02/28/20 1500  cefTRIAXone (ROCEPHIN) 2 g in sodium chloride 0.9 % 100 mL IVPB     2 g 200 mL/hr over 30 Minutes Intravenous Every 24 hours 02/28/20 1404 03/05/20 2359   02/26/20 1300  cefTRIAXone (ROCEPHIN) 1 g in sodium chloride 0.9 % 100 mL IVPB  Status:  Discontinued     1 g 200 mL/hr over 30 Minutes Intravenous Daily 02/26/20 1201 02/27/20 0930   02/16/20 1230  cefTRIAXone (ROCEPHIN) 2 g in sodium chloride 0.9 % 100 mL IVPB     2 g 200 mL/hr over 30 Minutes Intravenous Every 24 hours 02/16/20 1127 02/20/20 0941   02/16/20 1230  azithromycin (ZITHROMAX) 500 mg in sodium chloride 0.9 % 250 mL IVPB     500 mg 250 mL/hr over 60 Minutes Intravenous Every 24 hours 02/16/20 1127 02/20/20 1140   01/10/20 0930  vancomycin (VANCOCIN) IVPB 1000 mg/200 mL premix  Status:  Discontinued     1,000 mg 200 mL/hr over 60 Minutes Intravenous  Once 01/10/20 0915 01/13/20 0623   01/06/20 2200  ceFEPIme (MAXIPIME) 2 g in sodium chloride 0.9 % 100 mL IVPB     2 g 200 mL/hr over 30 Minutes Intravenous Every 24 hours 01/05/20 2251 01/11/20 2234   01/05/20 2200  ceFEPIme (MAXIPIME) 2  g in sodium chloride 0.9 % 100 mL IVPB     2 g 200 mL/hr over 30 Minutes Intravenous  Once 01/05/20 2148 01/06/20 0015   01/05/20 2200  metroNIDAZOLE (FLAGYL) IVPB 500 mg     500 mg 100 mL/hr over 60 Minutes Intravenous  Once 01/05/20 2148 01/06/20 0015   01/05/20 2200  vancomycin (VANCOCIN) IVPB 1000 mg/200 mL premix  Status:  Discontinued     1,000 mg 200 mL/hr over 60 Minutes Intravenous  Once 01/05/20 2148 01/05/20 2151   01/05/20 2200  vancomycin (VANCOREADY) IVPB 2000 mg/400 mL  Status:  Discontinued     2,000 mg 200 mL/hr over 120 Minutes Intravenous  Once 01/05/20 2151 01/05/20 2223       Time spent: 25 minutes-Greater than 50% of this time  was spent in counseling, explanation of diagnosis, planning of further management, and coordination of care.  MEDICATIONS: Scheduled Meds: . acetaminophen (TYLENOL) oral liquid 160 mg/5 mL  1,000 mg Per Tube Q8H  . bisacodyl  10 mg Rectal Daily  . busPIRone  5 mg Per Tube BID  . calcium acetate  667 mg Oral TID WC  . camphor-menthol   Topical QID  . chlorhexidine gluconate (MEDLINE KIT)  15 mL Mouth Rinse BID  . Chlorhexidine Gluconate Cloth  6 each Topical Q0600  . Chlorhexidine Gluconate Cloth  6 each Topical Q0600  . darbepoetin (ARANESP) injection - DIALYSIS  200 mcg Subcutaneous Q Wed-1800  . feeding supplement (NEPRO CARB STEADY)  1,000 mL Per Tube Q24H  . feeding supplement (PRO-STAT SUGAR FREE 64)  30 mL Per Tube TID  . heparin injection (subcutaneous)  5,000 Units Subcutaneous Q8H  . insulin aspart  0-15 Units Subcutaneous Q4H  . insulin glargine  8 Units Subcutaneous BID  . lactulose  20 g Per Tube TID  . mupirocin ointment  1 application Nasal BID  . pantoprazole sodium  40 mg Per Tube BID  . PARoxetine  40 mg Per Tube Daily  . polyethylene glycol  17 g Oral Daily  . sodium chloride flush  10-40 mL Intracatheter Q12H   Continuous Infusions: . ferric gluconate (FERRLECIT/NULECIT) IV 125 mg (03/06/20 0145)   PRN Meds:.feeding supplement (NEPRO CARB STEADY), guaiFENesin, metoprolol tartrate, ondansetron (ZOFRAN) IV, phenol, promethazine, sodium chloride flush   PHYSICAL EXAM: Vital signs: Vitals:   03/07/20 0400 03/07/20 0500 03/07/20 0800 03/07/20 1200  BP: (!) 150/59  (!) 153/61 (!) 142/61  Pulse: 72     Resp: '18  15 14  '$ Temp: 97.6 F (36.4 C)  97.8 F (36.6 C) (!) 97.5 F (36.4 C)  TempSrc: Oral  Axillary Axillary  SpO2: 100%     Weight:  89.2 kg    Height:       Filed Weights   03/05/20 2346 03/06/20 0300 03/07/20 0500  Weight: 90.9 kg 88.9 kg 89.2 kg   Body mass index is 33.76 kg/m.   Gen Exam:Alert awake-not in any distress HEENT:atraumatic,  normocephalic Chest: B/L clear to auscultation anteriorly CVS:S1S2 regular Abdomen:soft non tender, non distended Extremities:no edema Neurology: Appears to have significant amount of debility/generalized weakness but seems to be moving all 4 extremities Skin: no rash  I have personally reviewed following labs and imaging studies  LABORATORY DATA: CBC: Recent Labs  Lab 03/01/20 0717 03/02/20 0818 03/03/20 0515 03/05/20 0339  WBC 10.2 7.1 8.7 8.1  NEUTROABS 8.2* 5.3 6.8  --   HGB 8.2* 8.1* 8.6* 8.3*  HCT 29.2* 30.2* 30.7* 29.8*  MCV  95.1 97.1 97.5 97.1  PLT 188 169 PLATELET CLUMPS NOTED ON SMEAR, UNABLE TO ESTIMATE 938    Basic Metabolic Panel: Recent Labs  Lab 03/01/20 0717 03/01/20 0717 03/02/20 0818 03/03/20 0515 03/04/20 0416 03/05/20 0339 03/06/20 0508  NA 135   < > 140 138 136 135 136  K 3.6   < > 4.1 3.7 3.4* 3.6 3.4*  CL 96*   < > 101 100 97* 96* 98  CO2 28   < > '30 24 26 24 23  '$ GLUCOSE 163*   < > 97 144* 115* 88 94  BUN 61*   < > 36* 52* 71* 87* 42*  CREATININE 1.85*   < > 1.38* 1.68* 2.04* 2.25* 1.28*  CALCIUM 8.9   < > 8.7* 9.1 9.2 9.5 8.3*  MG 2.6*  --  2.5* 2.5*  --   --   --   PHOS 5.0*   < > 3.6 4.4 5.4* 6.1* 3.0   < > = values in this interval not displayed.    GFR: Estimated Creatinine Clearance: 43.6 mL/min (A) (by C-G formula based on SCr of 1.28 mg/dL (H)).  Liver Function Tests: Recent Labs  Lab 03/01/20 0717 03/01/20 0717 03/02/20 0818 03/03/20 0515 03/04/20 0416 03/05/20 0339 03/06/20 0508  AST 23  --  27 29  --   --   --   ALT 22  --  22 25  --   --   --   ALKPHOS 224*  --  207* 221*  --   --   --   BILITOT 0.6  --  0.2* 0.4  --   --   --   PROT 6.1*  --  5.9* 6.2*  --   --   --   ALBUMIN 2.4*   < > 2.3* 2.4* 2.5* 2.4* 2.2*   < > = values in this interval not displayed.   No results for input(s): LIPASE, AMYLASE in the last 168 hours. No results for input(s): AMMONIA in the last 168 hours.  Coagulation Profile: No  results for input(s): INR, PROTIME in the last 168 hours.  Cardiac Enzymes: No results for input(s): CKTOTAL, CKMB, CKMBINDEX, TROPONINI in the last 168 hours.  BNP (last 3 results) No results for input(s): PROBNP in the last 8760 hours.  Lipid Profile: No results for input(s): CHOL, HDL, LDLCALC, TRIG, CHOLHDL, LDLDIRECT in the last 72 hours.  Thyroid Function Tests: No results for input(s): TSH, T4TOTAL, FREET4, T3FREE, THYROIDAB in the last 72 hours.  Anemia Panel: No results for input(s): VITAMINB12, FOLATE, FERRITIN, TIBC, IRON, RETICCTPCT in the last 72 hours.  Urine analysis:    Component Value Date/Time   COLORURINE YELLOW 02/25/2020 1600   APPEARANCEUR TURBID (A) 02/25/2020 1600   LABSPEC 1.021 02/25/2020 1600   PHURINE 5.0 02/25/2020 1600   GLUCOSEU NEGATIVE 02/25/2020 1600   HGBUR MODERATE (A) 02/25/2020 1600   BILIRUBINUR NEGATIVE 02/25/2020 1600   KETONESUR NEGATIVE 02/25/2020 1600   PROTEINUR 100 (A) 02/25/2020 1600   NITRITE NEGATIVE 02/25/2020 1600   LEUKOCYTESUR MODERATE (A) 02/25/2020 1600    Sepsis Labs: Lactic Acid, Venous    Component Value Date/Time   LATICACIDVEN 0.7 02/24/2020 1823    MICROBIOLOGY: Recent Results (from the past 240 hour(s))  Body fluid culture (includes gram stain)     Status: None   Collection Time: 02/26/20  4:44 PM   Specimen: Pleural Fluid  Result Value Ref Range Status   Specimen Description FLUID RIGHT PLEURAL  Final   Special Requests NONE  Final   Gram Stain NO ORGANISMS SEEN NO WBC SEEN   Final   Culture   Final    NO GROWTH 3 DAYS Performed at [facility], [address] [place], [place] [address]    Report Status 02/29/2020 FINAL  Final  MRSA PCR Screening     Status: Abnormal   Collection Time: 03/03/20 11:00 AM   Specimen: Nasopharyngeal  Result Value Ref Range Status   MRSA by PCR POSITIVE (A) NEGATIVE Final    Comment:        The GeneXpert MRSA Assay (FDA approved for NASAL specimens only),  is one component of a comprehensive MRSA colonization surveillance program. It is not intended to diagnose MRSA infection nor to guide or monitor treatment for MRSA infections. RESULT CALLED TO, READ BACK BY AND VERIFIED WITH: Jefferson Fuel RN 14:35 03/03/20 ([email or web address]) Performed at [facility], [place] [address], [place], [place] [address]     RADIOLOGY STUDIES/RESULTS: No results found.   LOS: 33 days   Oren Binet, MD  [facility]    To contact the attending provider between 7A-7P or the covering provider during after hours 7P-7A, please log into the web site [email or web address] and access using universal [place] password for that web site. If you do not have the password, please call the hospital operator.  03/07/2020, 3:07 PM

## 2020-03-08 LAB — RENAL FUNCTION PANEL
Albumin: 2.3 g/dL — ABNORMAL LOW (ref 3.5–5.0)
Anion gap: 10 (ref 5–15)
BUN: 78 mg/dL — ABNORMAL HIGH (ref 8–23)
CO2: 26 mmol/L (ref 22–32)
Calcium: 9.4 mg/dL (ref 8.9–10.3)
Chloride: 100 mmol/L (ref 98–111)
Creatinine, Ser: 1.86 mg/dL — ABNORMAL HIGH (ref 0.44–1.00)
GFR calc Af Amer: 31 mL/min — ABNORMAL LOW (ref >60)
GFR calc non Af Amer: 27 mL/min — ABNORMAL LOW (ref >60)
Glucose, Bld: 75 mg/dL (ref 70–99)
Phosphorus: 5.2 mg/dL — ABNORMAL HIGH (ref 2.5–4.6)
Potassium: 3.9 mmol/L (ref 3.5–5.1)
Sodium: 136 mmol/L (ref 135–145)

## 2020-03-08 LAB — CBC
HCT: 29.2 % — ABNORMAL LOW (ref 36.0–46.0)
Hemoglobin: 8.1 g/dL — ABNORMAL LOW (ref 12.0–15.0)
MCH: 26.9 pg (ref 26.0–34.0)
MCHC: 27.7 g/dL — ABNORMAL LOW (ref 30.0–36.0)
MCV: 97 fL (ref 80.0–100.0)
Platelets: 167 10*3/uL (ref 150–400)
RBC: 3.01 MIL/uL — ABNORMAL LOW (ref 3.87–5.11)
RDW: 17.8 % — ABNORMAL HIGH (ref 11.5–15.5)
WBC: 4.9 10*3/uL (ref 4.0–10.5)
nRBC: 0 % (ref 0.0–0.2)

## 2020-03-08 LAB — GLUCOSE, CAPILLARY
Glucose-Capillary: 102 mg/dL — ABNORMAL HIGH (ref 70–99)
Glucose-Capillary: 111 mg/dL — ABNORMAL HIGH (ref 70–99)
Glucose-Capillary: 121 mg/dL — ABNORMAL HIGH (ref 70–99)
Glucose-Capillary: 123 mg/dL — ABNORMAL HIGH (ref 70–99)
Glucose-Capillary: 78 mg/dL (ref 70–99)
Glucose-Capillary: 93 mg/dL (ref 70–99)

## 2020-03-08 LAB — SARS CORONAVIRUS 2 (TAT 6-24 HRS): SARS Coronavirus 2: NEGATIVE

## 2020-03-08 MED ORDER — HEPARIN SODIUM (PORCINE) 1000 UNIT/ML DIALYSIS: 20.0000 [IU]/kg | INTRAMUSCULAR | Status: DC | PRN

## 2020-03-08 MED ORDER — LACTULOSE 10 GM/15ML PO SOLN
20.0000 g | Freq: Every day | ORAL | Status: DC
  Administered 2020-03-09 – 2020-03-13 (×5): 20 g
  Filled 2020-03-08 (×5): qty 30

## 2020-03-08 NOTE — Progress Notes (Signed)
Patient ID: Bailey Cox, female   DOB: [birth year], 74 y.o.   MRN: [id number] S: Feels a little better O:BP (!) 132/56 (BP Location: Left Arm)   Pulse 83   Temp 98.2 F (36.8 C) (Oral)   Resp 20   Ht '5\' 4"'$  (1.626 m)   Wt 89.2 kg   LMP  (LMP Unknown)   SpO2 95%   BMI 33.76 kg/m   Intake/Output Summary (Last 24 hours) at 03/08/2020 1330 Last data filed at 03/08/2020 1100 Gross per 24 hour  Intake 2037.66 ml  Output 300 ml  Net 1737.66 ml   Intake/Output: I/O last 3 completed shifts: In: 20 [I.V.:20] Out: 300 [Urine:300]  Intake/Output this shift:  Total I/O In: 2027.7 [P.O.:60; NG/GT:1628.7; IV Piggyback:339] Out: -  Weight change:  Gen: frail, obese elderly WF in NAD CVS: no rub Resp: decreased BS at bases Abd: obese, +BS, soft Ext: 1+ pretibial edema  Recent Labs  Lab 03/02/20 0818 03/03/20 0515 03/04/20 0416 03/05/20 0339 03/06/20 0508 03/08/20 0418  NA 140 138 136 135 136 136  K 4.1 3.7 3.4* 3.6 3.4* 3.9  CL 101 100 97* 96* 98 100  CO2 '30 24 26 24 23 26  '$ GLUCOSE 97 144* 115* 88 94 75  BUN 36* 52* 71* 87* 42* 78*  CREATININE 1.38* 1.68* 2.04* 2.25* 1.28* 1.86*  ALBUMIN 2.3* 2.4* 2.5* 2.4* 2.2* 2.3*  CALCIUM 8.7* 9.1 9.2 9.5 8.3* 9.4  PHOS 3.6 4.4 5.4* 6.1* 3.0 5.2*  AST 27 29  --   --   --   --   ALT 22 25  --   --   --   --    Liver Function Tests: Recent Labs  Lab 03/02/20 0818 03/02/20 0818 03/03/20 0515 03/04/20 0416 03/05/20 0339 03/06/20 0508 03/08/20 0418  AST 27  --  29  --   --   --   --   ALT 22  --  25  --   --   --   --   ALKPHOS 207*  --  221*  --   --   --   --   BILITOT 0.2*  --  0.4  --   --   --   --   PROT 5.9*  --  6.2*  --   --   --   --   ALBUMIN 2.3*   < > 2.4*   < > 2.4* 2.2* 2.3*   < > = values in this interval not displayed.   No results for input(s): LIPASE, AMYLASE in the last 168 hours. No results for input(s): AMMONIA in the last 168 hours. CBC: Recent Labs  Lab 03/02/20 0818 03/02/20 0818 03/03/20 0515  03/05/20 0339 03/08/20 0418  WBC 7.1   < > 8.7 8.1 4.9  NEUTROABS 5.3  --  6.8  --   --   HGB 8.1*   < > 8.6* 8.3* 8.1*  HCT 30.2*   < > 30.7* 29.8* 29.2*  MCV 97.1  --  97.5 97.1 97.0  PLT 169   < > PLATELET CLUMPS NOTED ON SMEAR, UNABLE TO ESTIMATE 175 167   < > = values in this interval not displayed.   Cardiac Enzymes: No results for input(s): CKTOTAL, CKMB, CKMBINDEX, TROPONINI in the last 168 hours. CBG: Recent Labs  Lab 03/07/20 2121 03/08/20 0043 03/08/20 0610 03/08/20 0729 03/08/20 1154  GLUCAP 95 121* 78 93 111*    Iron Studies: No results for input(s): IRON, TIBC,  TRANSFERRIN, FERRITIN in the last 72 hours. Studies/Results: No results found. Marland Kitchen acetaminophen (TYLENOL) oral liquid 160 mg/5 mL  1,000 mg Per Tube Q8H  . busPIRone  5 mg Per Tube BID  . calcium acetate  667 mg Oral TID WC  . camphor-menthol   Topical QID  . chlorhexidine gluconate (MEDLINE KIT)  15 mL Mouth Rinse BID  . Chlorhexidine Gluconate Cloth  6 each Topical Q0600  . darbepoetin (ARANESP) injection - DIALYSIS  200 mcg Subcutaneous Q Wed-1800  . feeding supplement (NEPRO CARB STEADY)  1,000 mL Per Tube Q24H  . feeding supplement (PRO-STAT SUGAR FREE 64)  30 mL Per Tube TID  . heparin injection (subcutaneous)  5,000 Units Subcutaneous Q8H  . insulin aspart  0-15 Units Subcutaneous Q4H  . insulin glargine  8 Units Subcutaneous BID  . lactulose  20 g Per Tube BID  . pantoprazole sodium  40 mg Per Tube BID  . PARoxetine  40 mg Per Tube Daily  . sodium chloride flush  10-40 mL Intracatheter Q12H    BMET    Component Value Date/Time   NA 136 03/08/2020 0418   K 3.9 03/08/2020 0418   CL 100 03/08/2020 0418   CO2 26 03/08/2020 0418   GLUCOSE 75 03/08/2020 0418   BUN 78 (H) 03/08/2020 0418   CREATININE 1.86 (H) 03/08/2020 0418   CALCIUM 9.4 03/08/2020 0418   GFRNONAA 27 (L) 03/08/2020 0418   GFRAA 31 (L) 03/08/2020 0418   CBC    Component Value Date/Time   WBC 4.9 03/08/2020 0418   RBC  3.01 (L) 03/08/2020 0418   HGB 8.1 (L) 03/08/2020 0418   HCT 29.2 (L) 03/08/2020 0418   PLT 167 03/08/2020 0418   MCV 97.0 03/08/2020 0418   MCH 26.9 03/08/2020 0418   MCHC 27.7 (L) 03/08/2020 0418   RDW 17.8 (H) 03/08/2020 0418   LYMPHSABS 0.8 03/03/2020 0515   MONOABS 0.8 03/03/2020 0515   EOSABS 0.1 03/03/2020 0515   BASOSABS 0.1 03/03/2020 0515     Assessment/Plan:  1. Oliguric AKI/CKD stage 3- due to ischemic ATN in setting of sepsis.  Unfortunately she has remained dialysis dependent since 01/09/20 and will require ongoing IHD.  Now on MWF schedule for outpateint HD at [place], [place].  Will plan for HD today as she will be discharged tomorrow morning. 1. Will need to follow for renal recovery as an outpatient 2. Using TDC 3. Remains oliguric with rapidly rising BUN.  Scr rising but less than 2 likely due to lack of muscle mass and malnutrition.  2. Debility- continue with PT/OT at SNF 3. Anemia of CKD 4. Sepsis- due to pseudomonas aspiration pneumonia.  off pressors and resolved.  Off vanco 5. H/o PEA arrest 6. HFpEF- mild volume overload. Will UF as tolerated  7. DM- per primary 8. 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  Was on trach but decannulated on 01/23/20.  Continue with BiPAP nightly. 9. Disposition- for discharge to SNF and arranged for outpatient HD in [place], [place], [place] [facility] [phone]

## 2020-03-08 NOTE — Progress Notes (Signed)
PROGRESS NOTE        PATIENT DETAILS Name: Bailey Cox Age: 74 y.o. Sex: female Date of Birth: September 08, 1946 Admit Date: 01/05/2020 Admitting Physician Kandis Cocking, MD FIE:PPIRJJO, No Pcp Per  Brief Narrative: 74 year old female with history of cholecystitis status post cholecystectomy in 9/20 at [facility]. She was discharged to SNF but rehospitalized to [facility] and had bile duct stent. Discharged back to SNF but readmitted with Ogilive syndrome. Hospital course complicated by fluid overload and transferred to [facility] for dialysis. At [facility], developed respiratory failure requiring vent support. She also suffered a cardiac arrest during EGD. She underwent trach,she was eventually discharged to [facility] on 3/1 after prolonged hospitalization at [facility] but deemed too sick, and sent to [facility] due to fluid overload and acidosis.  Patient was initially admitted to ICU. Required CRRT 3/9-3/12. Transfer to floor on 3/14. She pulled her trach. Now on oxygen by nasal cannula. She is on Intermitted HD via [place]. Has refused dialysis at times but deemed to lack capacity. Family wants full scope of care including full code and dialysis. She has been noncompliant with care and hemodialysis but lacks capacity. Ethics committee meeting held on 4/6. The plan is to continue full scope of care per family's wish. Finally shetolerated HD in recliner chair on 4/19 without sedating medications. She will be discharged to SNF once she had outpatient HD spot andcontinues to tolerate HD on recliner chair.  She's been started on lactulose for elevated ammonia with concern for hepatic encephalopathy as well as nightly bipap for acute hypercapneic resp failure with altered mental status.    Subjective: No major events overnight-lying comfortably in bed.  Feels better this morning compared to yesterday.  Assessment/Plan: Acute metabolic encephalopathy:  Multifactorial etiology-including AKI, hypercarbia, hospital/ICU delirium, hyperammonemia, pneumonia.  Encephalopathy is improved-although waxing and waning at times.  TSH/vitamin B12 within normal limits-RPR nonreactive.  Continue supportive care.  Septic shock secondary to UTI and PNA: Sepsis physiology has resolved-has completed a course of antimicrobial therapy.  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  Continue BiPAP nightly-wean oxygen as able.  Avoid sedating medications like narcotics/benzodiazepines as much as possible.  Pseudomonas aeruginosa/aspiration pneumonia with right-sided parapneumonic effusion: Has completed multiple courses of antibiotics-see below.  Patient is s/p  thoracocentesis by PCCM on 4/22-plans are to continue with Rocephin until 5/1.  Maintain aspiration precautions.  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  Noncompliant with HD but lacks capacity.  DM-2 with hyperglycemia ( A1c 5.7%): CBGs stable-continue Lantus 8 units twice daily and SSI.  CBG (last 3)  Recent Labs    03/08/20 0610 03/08/20 0729 03/08/20 1154  GLUCAP 78 93 111*    Essential hypertension: BP controlled-not on scheduled antihypertensives-remains on as needed metoprolol.   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  Hemoglobin stable-follow periodically.   Hypokalemia: Replete and recheck  Hyponatremia: Resolved.  Anxiety/depression/agitation: Stable this morning-calm-quiet and answered most of my questions appropriately.  Continue Paxil and BuSpar.  Maintain delirium precautions.  Due to lethargy/sedation-no longer on Haldol and Seroquel.  GERD: Continue PPI  Thrombocytopenia:  resolved.  Generalized weakness/debility: Secondary to acute illness-Per prior note--not  able to participate in therapy. Requiring mechanical lift to chair.  Chronic pain:-Scheduled Tylenol 1000 mg 3 times daily.  No longer onoxycodone and reduced IV fentanyl due to AMS.  Goals of care/Ethical issues-complex medical issue with guarded prognosis. Patient refused HD previously but deemed to lack capacity by psych. Daughter wishes her to be full code with full scope of care including dialysis.Continued on HD with the help of medications and family.Palliative care following.Ethics involved previously.   Nutrition Problem: Nutrition Problem: Inadequate oral intake Etiology: poor appetite, acute illness Signs/Symptoms: meal completion < 25% Interventions: Tube feeding, Nepro shake  Obesity: Estimated body mass index is 33.76 kg/m as calculated from the following:   Height as of this encounter: 5' 4" (1.626 m).   Weight as of this encounter: 89.2 kg.   DVT prophylaxis: SQ heparin  Diet: Diet Order            DIET DYS 2 Room service appropriate? No; Fluid consistency: Thin  Diet effective now               Code Status: Full code   Family Communication: Spoke to daughter on 5/3  Procedures : 3/5 IJ vas cath-> 4/22 right-sided thoracocentesis>>  Significant studies: Echo 10/24/2019 > LVEF 55%, Grade 1 DD. RV systolic function normal.  Echo 3/2: LVEF 65-70%, grade II dysfunction. RVSP  Consults: Nephrology PCCM Psychiatry Palliative care Interventional radiology Ethics  Disposition Plan:  SNF when ready for discharge-awaiting bed. Status is: Inpatient  Remains inpatient appropriate because:Inpatient level of care appropriate due to severity of illness   Dispo: The patient is from: [facility]              Anticipated d/c is to: SNF              Anticipated d/c date is: > 3 days              Patient currently is not medically stable to d/c.   Antimicrobial agents: Anti-infectives (From admission, onward)   Start     Dose/Rate Route Frequency  Ordered Stop   02/28/20 1500  cefTRIAXone (ROCEPHIN) 2 g in sodium chloride 0.9 % 100 mL IVPB     2 g 200 mL/hr over 30 Minutes Intravenous Every 24 hours 02/28/20 1404 03/05/20 2359   02/26/20 1300  cefTRIAXone (ROCEPHIN) 1 g in sodium chloride 0.9 % 100 mL IVPB  Status:  Discontinued     1 g 200 mL/hr over 30 Minutes Intravenous Daily 02/26/20 1201 02/27/20 0930   02/16/20 1230  cefTRIAXone (ROCEPHIN) 2 g in sodium chloride 0.9 % 100 mL IVPB     2 g 200 mL/hr over 30 Minutes Intravenous Every 24 hours 02/16/20 1127 02/20/20 0941   02/16/20 1230  azithromycin (ZITHROMAX) 500 mg in sodium chloride 0.9 % 250 mL IVPB     500 mg 250 mL/hr over 60 Minutes Intravenous Every 24 hours 02/16/20 1127 02/20/20 1140   01/10/20 0930  vancomycin (VANCOCIN) IVPB 1000 mg/200 mL premix  Status:  Discontinued     1,000 mg 200 mL/hr over 60 Minutes Intravenous  Once 01/10/20 0915 01/13/20 0623   01/06/20 2200  ceFEPIme (MAXIPIME) 2 g in sodium chloride 0.9 % 100 mL IVPB     2 g 200 mL/hr over 30 Minutes Intravenous Every 24 hours 01/05/20 2251 01/11/20 2234   01/05/20 2200  ceFEPIme (MAXIPIME) 2 g in sodium chloride 0.9 % 100 mL IVPB     2 g 200 mL/hr over 30 Minutes Intravenous  Once 01/05/20 2148 01/06/20 0015   01/05/20  2200  metroNIDAZOLE (FLAGYL) IVPB 500 mg     500 mg 100 mL/hr over 60 Minutes Intravenous  Once 01/05/20 2148 01/06/20 0015   01/05/20 2200  vancomycin (VANCOCIN) IVPB 1000 mg/200 mL premix  Status:  Discontinued     1,000 mg 200 mL/hr over 60 Minutes Intravenous  Once 01/05/20 2148 01/05/20 2151   01/05/20 2200  vancomycin (VANCOREADY) IVPB 2000 mg/400 mL  Status:  Discontinued     2,000 mg 200 mL/hr over 120 Minutes Intravenous  Once 01/05/20 2151 01/05/20 2223       Time spent: 25 minutes-Greater than 50% of this time was spent in counseling, explanation of diagnosis, planning of further management, and coordination of care.  MEDICATIONS: Scheduled Meds: . acetaminophen  (TYLENOL) oral liquid 160 mg/5 mL  1,000 mg Per Tube Q8H  . busPIRone  5 mg Per Tube BID  . calcium acetate  667 mg Oral TID WC  . camphor-menthol   Topical QID  . chlorhexidine gluconate (MEDLINE KIT)  15 mL Mouth Rinse BID  . Chlorhexidine Gluconate Cloth  6 each Topical Q0600  . darbepoetin (ARANESP) injection - DIALYSIS  200 mcg Subcutaneous Q Wed-1800  . feeding supplement (NEPRO CARB STEADY)  1,000 mL Per Tube Q24H  . feeding supplement (PRO-STAT SUGAR FREE 64)  30 mL Per Tube TID  . heparin injection (subcutaneous)  5,000 Units Subcutaneous Q8H  . insulin aspart  0-15 Units Subcutaneous Q4H  . insulin glargine  8 Units Subcutaneous BID  . lactulose  20 g Per Tube BID  . pantoprazole sodium  40 mg Per Tube BID  . PARoxetine  40 mg Per Tube Daily  . sodium chloride flush  10-40 mL Intracatheter Q12H   Continuous Infusions: . ferric gluconate (FERRLECIT/NULECIT) IV 125 mg (03/08/20 1324)   PRN Meds:.feeding supplement (NEPRO CARB STEADY), guaiFENesin, metoprolol tartrate, ondansetron (ZOFRAN) IV, phenol, promethazine, sodium chloride flush   PHYSICAL EXAM: Vital signs: Vitals:   03/08/20 0524 03/08/20 0727 03/08/20 0800 03/08/20 1209  BP:  (!) 124/48 (!) 129/54 (!) 132/56  Pulse:  82 82 83  Resp:  _0 Temp: 98.4 F (36.9 C) 98 F (36.7 C)  98.2 F (36.8 C)  TempSrc: Oral Oral  Oral  SpO2:  95% 96% 95%  Weight:      Height:       Filed Weights   03/05/20 2346 03/06/20 0300 03/07/20 0500  Weight: 90.9 kg 88.9 kg 89.2 kg   Body mass index is 33.76 kg/m.   Gen Exam:Alert awake-not in any distress HEENT:atraumatic, normocephalic Chest: B/L clear to auscultation anteriorly CVS:S1S2 regular Abdomen:soft non tender, non distended Extremities:no edema Neurology: Appears to have significant amount of debility/generalized weakness but seems to be moving all 4 extremities Skin: no rash  I have personally reviewed following labs and imaging studies  LABORATORY  DATA: CBC: Recent Labs  Lab 03/02/20 0818 03/03/20 0515 03/05/20 0339 03/08/20 0418  WBC 7.1 8.7 8.1 4.9  NEUTROABS 5.3 6.8  --   --   HGB 8.1* 8.6* 8.3* 8.1*  HCT 30.2* 30.7* 29.8* 29.2*  MCV 97.1 97.5 97.1 97.0  PLT 169 PLATELET CLUMPS NOTED ON SMEAR, UNABLE TO ESTIMATE 175 948    Basic Metabolic Panel: Recent Labs  Lab 03/02/20 0818 03/02/20 0818 03/03/20 0515 03/04/20 0416 03/05/20 0339 03/06/20 0508 03/08/20 0418  NA 140   < > 138 136 135 136 136  K 4.1   < > 3.7 3.4* 3.6 3.4* 3.9  CL 101   < > 100 97* 96* 98 100  CO2 30   < > _0 GLUCOSE 97   < > 144* 115* 88 94 75  BUN 36*   < > 52* 71* 87* 42* 78*  CREATININE 1.38*   < > 1.68* 2.04* 2.25* 1.28* 1.86*  CALCIUM 8.7*   < > 9.1 9.2 9.5 8.3* 9.4  MG 2.5*  --  2.5*  --   --   --   --   PHOS 3.6   < > 4.4 5.4* 6.1* 3.0 5.2*   < > = values in this interval not displayed.    GFR: Estimated Creatinine Clearance: 30 mL/min (A) (by C-G formula based on SCr of 1.86 mg/dL (H)).  Liver Function Tests: Recent Labs  Lab 03/02/20 0818 03/02/20 0818 03/03/20 0515 03/04/20 0416 03/05/20 0339 03/06/20 0508 03/08/20 0418  AST 27  --  29  --   --   --   --   ALT 22  --  25  --   --   --   --   ALKPHOS 207*  --  221*  --   --   --   --   BILITOT 0.2*  --  0.4  --   --   --   --   PROT 5.9*  --  6.2*  --   --   --   --   ALBUMIN 2.3*   < > 2.4* 2.5* 2.4* 2.2* 2.3*   < > = values in this interval not displayed.   No results for input(s): LIPASE, AMYLASE in the last 168 hours. No results for input(s): AMMONIA in the last 168 hours.  Coagulation Profile: No results for input(s): INR, PROTIME in the last 168 hours.  Cardiac Enzymes: No results for input(s): CKTOTAL, CKMB, CKMBINDEX, TROPONINI in the last 168 hours.  BNP (last 3 results) No results for input(s): PROBNP in the last 8760 hours.  Lipid Profile: No results for input(s): CHOL, HDL, LDLCALC, TRIG, CHOLHDL, LDLDIRECT in the last 72  hours.  Thyroid Function Tests: No results for input(s): TSH, T4TOTAL, FREET4, T3FREE, THYROIDAB in the last 72 hours.  Anemia Panel: No results for input(s): VITAMINB12, FOLATE, FERRITIN, TIBC, IRON, RETICCTPCT in the last 72 hours.  Urine analysis:    Component Value Date/Time   COLORURINE YELLOW 02/25/2020 1600   APPEARANCEUR TURBID (A) 02/25/2020 1600   LABSPEC 1.021 02/25/2020 1600   PHURINE 5.0 02/25/2020 1600   GLUCOSEU NEGATIVE 02/25/2020 1600   HGBUR MODERATE (A) 02/25/2020 1600   BILIRUBINUR NEGATIVE 02/25/2020 1600   KETONESUR NEGATIVE 02/25/2020 1600   PROTEINUR 100 (A) 02/25/2020 1600   NITRITE NEGATIVE 02/25/2020 1600   LEUKOCYTESUR MODERATE (A) 02/25/2020 1600    Sepsis Labs: Lactic Acid, Venous    Component Value Date/Time   LATICACIDVEN 0.7 02/24/2020 1823    MICROBIOLOGY: Recent Results (from the past 240 hour(s))  MRSA PCR Screening     Status: Abnormal   Collection Time: 03/03/20 11:00 AM   Specimen: Nasopharyngeal  Result Value Ref Range Status   MRSA by PCR POSITIVE (A) NEGATIVE Final    Comment:        The GeneXpert MRSA Assay (FDA approved for NASAL specimens only), is one component of a comprehensive MRSA colonization surveillance program. It is not intended to diagnose MRSA infection nor to guide or monitor treatment for MRSA infections. RESULT CALLED TO, READ BACK BY AND VERIFIED WITH: G.  Scalco RN 14:35 03/03/20 ([email or web address]) Performed at [facility], [place] [address], [place], [place] [address]     RADIOLOGY STUDIES/RESULTS: No results found.   LOS: 35 days   Oren Binet, MD  [facility]    To contact the attending provider between 7A-7P or the covering provider during after hours 7P-7A, please log into the web site [email or web address] and access using universal [place] password for that web site. If you do not have the password, please call the hospital operator.  03/08/2020, 3:39 PM

## 2020-03-08 NOTE — Progress Notes (Signed)
CSW received call from [facility], [facility], that she now cannot transport patient to dialysis. She is going to contact another agency tomorrow to see if they would be willing to do so. CSW cancelled [facility] transport.   Bryce Cheever LCSW

## 2020-03-08 NOTE — Progress Notes (Signed)
Pt refuses to put on BIPAP and is extremely agitated and upset when trying to reason or explain to pt about wearing BIPAP. Pt resting comfortably on 1L NCAN. RT to cont to monitor.

## 2020-03-08 NOTE — Progress Notes (Signed)
Renal Navigator received call from CSW/N. Rayyan regarding plan to discharge to SNF tomorrow, 03/09/20. Navigator discussed discharge plan with Nephrologist/Dr. Marval Regal and notes that patient was held off HD today (AKI). He notes that he will put patient on the schedule today, with plan for discharge tomorrow, per CSW/N. Rayyan. Patient will then start in the OP HD clinic on Wednesday, 03/10/20. She needs to arrive at 11:30am. Renal Navigator spoke with [facility] and will continue to monitor for any changes to the plan. Navigator will fax discharge summary directly to OP HD clinic/[facility] at discharge.  Alphonzo Cruise, [place] Renal Navigator [phone]

## 2020-03-08 NOTE — TOC Progression Note (Addendum)
Transition of Care [facility]) - Progression Note    Patient Details  Name: Bailey Cox MRN: [id number] Date of Birth: [birth year]  Transition of [facility]) CM/SW [place], LCSW Phone Number: 03/08/2020, 11:22 AM  Clinical Narrative:    [facility] able to accept patient tomorrow pending updated COVID test (requested) and transportation arrangement. CSW contacted patient's daughter who is accepting of plan. CSW provided transportation cost for [facility] [phone]) and [facility] [phone]). Patient's daughter selected [facility] and will provide payment to them today. CSW confirming dialysis schedule at the hospital with Renal Navigator. Patient's daughter requesting assistance in obtaining patient's things from [facility]. CSW left voicemail for [facility] admissions to see if they can assist.   Update: [facility] unable to provide oxygen. CSW contacted [facility]. They will be able to go through patient's insurance and pick patient up tomorrow at 11:30am. CSW contacted [facility] to cancel the transport. Patient's daughter aware.    Expected Discharge Plan: Skilled Nursing Facility Barriers to Discharge: Continued Medical Work up, Other (comment)(ability to tolerate dialysis in recliner)  Expected Discharge Plan and Services Expected Discharge Plan: [place] In-house Referral: Clinical Social Work Discharge Planning Services: CM Consult Post Acute Care Choice: [place] Living arrangements for the past 2 months: (Has been hospitalized for over 4 months)                                       Social Determinants of Health (SDOH) Interventions    Readmission Risk Interventions Readmission Risk Prevention Plan 01/28/2020  Transportation Screening Complete  PCP or Specialist Appt within 3-5 Days Not Complete  Not Complete comments plan for SNF  HRI or [place] Not Complete  HRI or Home Care Consult comments plan for SNF  Social Work  Consult for [place] Planning/Counseling Complete  Palliative Care Screening Complete  Medication Review Press photographer) Referral to Pharmacy  PCP or Specialist appointment within 3-5 days of discharge Not Complete  PCP/Specialist Appt Not Complete comments plan for SNF  HRI or Home Care Consult Complete  SW Recovery Care/Counseling Consult Complete  Palliative Care Screening Not Complete  Comments appropriate at this time  [place] Complete

## 2020-03-08 NOTE — Progress Notes (Signed)
Pt refuses to wear Bipap QHS. Pt is in no distress.

## 2020-03-09 LAB — GLUCOSE, CAPILLARY
Glucose-Capillary: 73 mg/dL (ref 70–99)
Glucose-Capillary: 115 mg/dL — ABNORMAL HIGH (ref 70–99)
Glucose-Capillary: 69 mg/dL — ABNORMAL LOW (ref 70–99)
Glucose-Capillary: 99 mg/dL (ref 70–99)
Glucose-Capillary: 99 mg/dL (ref 70–99)
Glucose-Capillary: 107 mg/dL — ABNORMAL HIGH (ref 70–99)

## 2020-03-09 LAB — RENAL FUNCTION PANEL
Albumin: 2.3 g/dL — ABNORMAL LOW (ref 3.5–5.0)
Anion gap: 14 (ref 5–15)
BUN: 90 mg/dL — ABNORMAL HIGH (ref 8–23)
CO2: 26 mmol/L (ref 22–32)
Calcium: 9.7 mg/dL (ref 8.9–10.3)
Chloride: 94 mmol/L — ABNORMAL LOW (ref 98–111)
Creatinine, Ser: 2.14 mg/dL — ABNORMAL HIGH (ref 0.44–1.00)
GFR calc Af Amer: 26 mL/min — ABNORMAL LOW (ref >60)
GFR calc non Af Amer: 23 mL/min — ABNORMAL LOW (ref >60)
Glucose, Bld: 102 mg/dL — ABNORMAL HIGH (ref 70–99)
Phosphorus: 5.5 mg/dL — ABNORMAL HIGH (ref 2.5–4.6)
Potassium: 3.8 mmol/L (ref 3.5–5.1)
Sodium: 134 mmol/L — ABNORMAL LOW (ref 135–145)

## 2020-03-09 LAB — CBC
HCT: 29.3 % — ABNORMAL LOW (ref 36.0–46.0)
Hemoglobin: 7.9 g/dL — ABNORMAL LOW (ref 12.0–15.0)
MCH: 26.9 pg (ref 26.0–34.0)
MCHC: 27 g/dL — ABNORMAL LOW (ref 30.0–36.0)
MCV: 99.7 fL (ref 80.0–100.0)
Platelets: 171 10*3/uL (ref 150–400)
RBC: 2.94 MIL/uL — ABNORMAL LOW (ref 3.87–5.11)
RDW: 17.8 % — ABNORMAL HIGH (ref 11.5–15.5)
WBC: 4.6 10*3/uL (ref 4.0–10.5)
nRBC: 0 % (ref 0.0–0.2)

## 2020-03-09 MED ORDER — INSULIN ASPART 100 UNIT/ML ~~LOC~~ SOLN
0.0000 [IU] | SUBCUTANEOUS | Status: DC
  Administered 2020-03-10 – 2020-03-14 (×5): 1 [IU] via SUBCUTANEOUS
  Administered 2020-03-14: 2 [IU] via SUBCUTANEOUS
  Administered 2020-03-14: 1 [IU] via SUBCUTANEOUS
  Administered 2020-03-15: 5 [IU] via SUBCUTANEOUS
  Administered 2020-03-16 – 2020-03-21 (×12): 1 [IU] via SUBCUTANEOUS
  Administered 2020-03-21: 2 [IU] via SUBCUTANEOUS
  Administered 2020-03-21: 1 [IU] via SUBCUTANEOUS

## 2020-03-09 MED ORDER — INSULIN GLARGINE 100 UNIT/ML ~~LOC~~ SOLN
8.0000 [IU] | Freq: Every day | SUBCUTANEOUS | Status: DC
  Administered 2020-03-09 – 2020-03-20 (×12): 8 [IU] via SUBCUTANEOUS
  Filled 2020-03-09 (×13): qty 0.08

## 2020-03-09 MED ORDER — HEPARIN SODIUM (PORCINE) 1000 UNIT/ML IJ SOLN
INTRAMUSCULAR | Status: AC
  Filled 2020-03-09: qty 4

## 2020-03-09 NOTE — TOC Progression Note (Signed)
Transition of Care [facility]) - Progression Note    Patient Details  Name: Bailey Cox MRN: [id number] Date of Birth: May 04, 1946  Transition of Care [facility]) CM/SW [place], LCSW Phone Number: 03/09/2020, 4:30 PM  Clinical Narrative:    CSW awaiting call back from [facility] to see if there are any updates on resolution of transportation issue.    Expected Discharge Plan: Skilled Nursing Facility Barriers to Discharge: Continued Medical Work up, Other (comment)(ability to tolerate dialysis in recliner)  Expected Discharge Plan and Services Expected Discharge Plan: [place] In-house Referral: Clinical Social Work Discharge Planning Services: CM Consult Post Acute Care Choice: [place] Living arrangements for the past 2 months: (Has been hospitalized for over 4 months)                                       Social Determinants of Health (SDOH) Interventions    Readmission Risk Interventions Readmission Risk Prevention Plan 01/28/2020  Transportation Screening Complete  PCP or Specialist Appt within 3-5 Days Not Complete  Not Complete comments plan for SNF  HRI or [place] Not Complete  HRI or Home Care Consult comments plan for SNF  Social Work Consult for [place] Planning/Counseling Complete  Palliative Care Screening Complete  Medication Review Press photographer) Referral to Pharmacy  PCP or Specialist appointment within 3-5 days of discharge Not Complete  PCP/Specialist Appt Not Complete comments plan for SNF  HRI or Home Care Consult Complete  SW Recovery Care/Counseling Consult Complete  Palliative Care Screening Not Complete  Comments appropriate at this time  [place] Complete

## 2020-03-09 NOTE — Progress Notes (Signed)
Renal Navigator updated OP HD clinic/[facility] that patient will not be starting in the clinic tomorrow due to transportation barriers from SNF to HD clinic that have arisen since originally informing them of her start date. Navigator will continue to follow closely and update clinic of patient's new discharge/start date once this has been determined.  Alphonzo Cruise,  Renal Navigator [phone]

## 2020-03-09 NOTE — Progress Notes (Signed)
Nutrition Follow-up  DOCUMENTATION CODES:   Not applicable  INTERVENTION:  Continue Nepro formula via PEG at goal rate of 40 ml/hr.   Continue 30 ml Prostat TID per tube.   Tube feeding regimen provides 2028 kcal (100% of needs), 123 grams of protein, and 701 ml water.   Continue Nepro Shake po prn, each supplement provides 425 kcal and 19 grams of protein.   NUTRITION DIAGNOSIS:   Inadequate oral intake related to poor appetite, acute illness as evidenced by meal completion < 25%; ongoing  GOAL:   Patient will meet greater than or equal to 90% of their needs; met with TF  MONITOR:   PO intake, Supplement acceptance, TF tolerance  REASON FOR ASSESSMENT:   Consult, Ventilator Enteral/tube feeding initiation and management  ASSESSMENT:   Pt with PMH of DM, CKD, GERD, HTN, vascular dementia, AKI on CKD, HF, and recent long admission to [facility] from 10/08/19 - 01/05/20 with acute cholecystitis s/p cholecystectomy, bowel dilation secondary to Ogilvie's syndrome, fluid overload requiring short term HD, cardiac arrest, trach placement, MRSA pneumonia who d/c'ed from [facility] 3/1 and transferred to [facility] but was deemed too sick for admission and transferred to [facility]. 3/19- s/p decannulation. Pt with AKI/CKD stage 3 now iHD dependent.  Pt continues on a dysphagia 2 diet with thin liquids. Meal completion has been 0-25%. Pt refusing PO at meals today and reports not wanting to eat. Pt has been tolerating her tube feeds well with no difficulties. Tube feeding to continue to provide 100% of patient's nutrition needs as pt with very poor to no PO.   Labs and medications reviewed. Phosphorous elevated at 5.5.  Diet Order:   Diet Order            DIET DYS 2 Room service appropriate? No; Fluid consistency: Thin  Diet effective now              EDUCATION NEEDS:   No education needs have been identified at this time  Skin:  Skin Assessment: Skin Integrity Issues: Skin Integrity Issues:: Other  (Comment) Stage I: N/A Other: MASD abdomen  Last BM:  5/3  Height:   Ht Readings from Last 1 Encounters:  01/05/20 5' 4" (1.626 m)    Weight:   Wt Readings from Last 1 Encounters:  03/09/20 89.5 kg    BMI:  Body mass index is 33.87 kg/m.  Estimated Nutritional Needs:   Kcal:  1900-2100 kcal  Protein:  110-125 grams  Fluid:  >/= 1.5 L/day   Corrin Parker, MS, RD, LDN RD pager number/after hours weekend pager number on Amion.

## 2020-03-09 NOTE — Progress Notes (Signed)
Patient ID: Bailey Cox, female   DOB: 1946/04/17, 74 y.o.   MRN: [id number] S: Discharge was delayed due to issues with transportation to and from outpatient dialysis. O:BP (!) 138/58 (BP Location: Left Arm)   Pulse 80   Temp 98.5 F (36.9 C)   Resp 20   Ht _0  (1.626 m)   Wt 89.5 kg   LMP  (LMP Unknown)   SpO2 100%   BMI 33.87 kg/m   Intake/Output Summary (Last 24 hours) at 03/09/2020 1242 Last data filed at 03/09/2020 0252 Gross per 24 hour  Intake 560 ml  Output 2100 ml  Net -1540 ml   Intake/Output: I/O last 3 completed shifts: In: 2597.7 [P.O.:60; I.V.:20; Other:120; PQ/DI:2641.5; IV Piggyback:449] Out: 2100 [Urine:100; Other:2000]  Intake/Output this shift:  No intake/output data recorded. Weight change:  Gen: NAD, obese WF  CVS: rrr, no rub Resp: cta Abd: obese, +BS, soft Ext: trace pretibial edema  Recent Labs  Lab 03/03/20 0515 03/04/20 0416 03/05/20 0339 03/06/20 0508 03/08/20 0418 03/09/20 0152  NA 138 136 135 136 136 134*  K 3.7 3.4* 3.6 3.4* 3.9 3.8  CL 100 97* 96* 98 100 94*  CO2 _1 GLUCOSE 144* 115* 88 94 75 102*  BUN 52* 71* 87* 42* 78* 90*  CREATININE 1.68* 2.04* 2.25* 1.28* 1.86* 2.14*  ALBUMIN 2.4* 2.5* 2.4* 2.2* 2.3* 2.3*  CALCIUM 9.1 9.2 9.5 8.3* 9.4 9.7  PHOS 4.4 5.4* 6.1* 3.0 5.2* 5.5*  AST 29  --   --   --   --   --   ALT 25  --   --   --   --   --    Liver Function Tests: Recent Labs  Lab 03/03/20 0515 03/04/20 0416 03/06/20 0508 03/08/20 0418 03/09/20 0152  AST 29  --   --   --   --   ALT 25  --   --   --   --   ALKPHOS 221*  --   --   --   --   BILITOT 0.4  --   --   --   --   PROT 6.2*  --   --   --   --   ALBUMIN 2.4*   < > 2.2* 2.3* 2.3*   < > = values in this interval not displayed.   No results for input(s): LIPASE, AMYLASE in the last 168 hours. No results for input(s): AMMONIA in the last 168 hours. CBC: Recent Labs  Lab 03/03/20 0515 03/03/20 0515 03/05/20 0339 03/08/20 0418  03/09/20 0152  WBC 8.7   < > 8.1 4.9 4.6  NEUTROABS 6.8  --   --   --   --   HGB 8.6*   < > 8.3* 8.1* 7.9*  HCT 30.7*   < > 29.8* 29.2* 29.3*  MCV 97.5  --  97.1 97.0 99.7  PLT PLATELET CLUMPS NOTED ON SMEAR, UNABLE TO ESTIMATE   < > 175 167 171   < > = values in this interval not displayed.   Cardiac Enzymes: No results for input(s): CKTOTAL, CKMB, CKMBINDEX, TROPONINI in the last 168 hours. CBG: Recent Labs  Lab 03/08/20 1633 03/08/20 2009 03/09/20 0356 03/09/20 0746 03/09/20 0817  GLUCAP 102* 123* 73 69* 115*    Iron Studies: No results for input(s): IRON, TIBC, TRANSFERRIN, FERRITIN in the last 72 hours. Studies/Results: No results found. Marland Kitchen acetaminophen (TYLENOL) oral liquid 160 mg/5 mL  1,000 mg Per Tube Q8H  . busPIRone  5 mg Per Tube BID  . calcium acetate  667 mg Oral TID WC  . camphor-menthol   Topical QID  . chlorhexidine gluconate (MEDLINE KIT)  15 mL Mouth Rinse BID  . Chlorhexidine Gluconate Cloth  6 each Topical Q0600  . darbepoetin (ARANESP) injection - DIALYSIS  200 mcg Subcutaneous Q Wed-1800  . feeding supplement (NEPRO CARB STEADY)  1,000 mL Per Tube Q24H  . feeding supplement (PRO-STAT SUGAR FREE 64)  30 mL Per Tube TID  . heparin injection (subcutaneous)  5,000 Units Subcutaneous Q8H  . heparin sodium (porcine)      . insulin aspart  0-9 Units Subcutaneous Q4H  . insulin glargine  8 Units Subcutaneous QHS  . lactulose  20 g Per Tube Daily  . pantoprazole sodium  40 mg Per Tube BID  . PARoxetine  40 mg Per Tube Daily  . sodium chloride flush  10-40 mL Intracatheter Q12H    BMET    Component Value Date/Time   NA 134 (L) 03/09/2020 0152   K 3.8 03/09/2020 0152   CL 94 (L) 03/09/2020 0152   CO2 26 03/09/2020 0152   GLUCOSE 102 (H) 03/09/2020 0152   BUN 90 (H) 03/09/2020 0152   CREATININE 2.14 (H) 03/09/2020 0152   CALCIUM 9.7 03/09/2020 0152   GFRNONAA 23 (L) 03/09/2020 0152   GFRAA 26 (L) 03/09/2020 0152   CBC    Component Value  Date/Time   WBC 4.6 03/09/2020 0152   RBC 2.94 (L) 03/09/2020 0152   HGB 7.9 (L) 03/09/2020 0152   HCT 29.3 (L) 03/09/2020 0152   PLT 171 03/09/2020 0152   MCV 99.7 03/09/2020 0152   MCH 26.9 03/09/2020 0152   MCHC 27.0 (L) 03/09/2020 0152   RDW 17.8 (H) 03/09/2020 0152   LYMPHSABS 0.8 03/03/2020 0515   MONOABS 0.8 03/03/2020 0515   EOSABS 0.1 03/03/2020 0515   BASOSABS 0.1 03/03/2020 0515     Assessment/Plan:  1. Oliguric AKI/CKD stage 3- due to ischemic ATN in setting of sepsis.  Unfortunately she has remained dialysis dependent since 01/09/20 and will require ongoing IHD.  Now on MWF schedule for outpateint HD at [facility], [place].   1. Will need to follow for renal recovery as an outpatient 2. Using TDC 3. Remains oliguric with rapidly rising BUN.  Scr rising but less than 2 likely due to lack of muscle mass and malnutrition.  4. Continue with MWF schedule. 2. Debility- continue with PT/OT at SNF 3. Anemia of CKD 4. Sepsis- due to pseudomonas aspiration pneumonia.  off pressors and resolved.  Off vanco 5. H/o PEA arrest 6. HFpEF- mild volume overload. Will UF as tolerated  7. DM- per primary 8. 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  Was on trach but decannulated on 01/23/20.  Continue with BiPAP nightly. 9. Disposition- for discharge to SNF and arranged for outpatient HD in [place], [place], awaiting transportation issues to be resolved.   Donetta Potts, MD [employer] [phone]

## 2020-03-09 NOTE — Progress Notes (Signed)
PROGRESS NOTE        PATIENT DETAILS Name: Bailey Cox Age: 74 y.o. Sex: female Date of Birth: [birth year] Admit Date: 01/05/2020 Admitting Physician Kandis Cocking, MD QIH:[id number], No Pcp Per  Brief Narrative: 74 year old female with history of cholecystitis status post cholecystectomy in 9/20 at [facility], [place]. She was discharged to SNF but rehospitalized to [facility] and had bile duct stent. Discharged back to SNF but readmitted with Ogilive syndrome. Hospital course complicated by fluid overload and transferred to [facility] for dialysis. At [facility], developed respiratory failure requiring vent support. She also suffered a cardiac arrest during EGD. She underwent trach,she was eventually discharged to [facility] on 3/1 after prolonged hospitalization at [facility] but deemed too sick, and sent to [facility] due to fluid overload and acidosis.  Patient was initially admitted to ICU. Required CRRT 3/9-3/12. Transfer to floor on 3/14. She pulled her trach. Now on oxygen by nasal cannula. She is on Intermitted HD via [place]. Has refused dialysis at times but deemed to lack capacity. Family wants full scope of care including full code and dialysis. She has been noncompliant with care and hemodialysis but lacks capacity. Ethics committee meeting held on 4/6. The plan is to continue full scope of care per family's wish. Finally shetolerated HD in recliner chair on 4/19 without sedating medications. She will be discharged to SNF once she had outpatient HD spot andcontinues to tolerate HD on recliner chair.  She's been started on lactulose for elevated ammonia with concern for hepatic encephalopathy as well as nightly bipap for acute hypercapneic resp failure with altered mental status.    Subjective: Feels much better-no chest pain or shortness of breath.  Assessment/Plan: Acute metabolic encephalopathy: Multifactorial etiology-including AKI, hypercarbia,  hospital/ICU delirium, hyperammonemia, pneumonia.  Encephalopathy is improved-although waxing and waning at times.  TSH/vitamin B12 within normal limits-RPR nonreactive.  Continue supportive care.  Septic shock secondary to UTI and PNA: Sepsis physiology has resolved-has completed a course of antimicrobial therapy.  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  Continue BiPAP nightly-wean oxygen as able.  Avoid sedating medications like narcotics/benzodiazepines as much as possible.  Pseudomonas aeruginosa/aspiration pneumonia with right-sided parapneumonic effusion: Has completed multiple courses of antibiotics-see below.  Patient is s/p  thoracocentesis by PCCM on 4/22-plans are to continue with Rocephin until 5/1.  Maintain aspiration precautions.  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  Noncompliant with HD but lacks capacity.  DM-2 with hyperglycemia ( A1c 5.7%): Hypoglycemic episode earlier-change Lantus to daily dosing-decrease SSI to sensitive scale.  Follow and adjust.  CBG (last 3)  Recent Labs    03/09/20 0356 03/09/20 0746 03/09/20 0817  GLUCAP 73 69* 115*    Essential hypertension: BP controlled-not on scheduled antihypertensives-remains on as needed metoprolol.   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  Hemoglobin stable-follow periodically.   Hypokalemia: Repleted  Hyponatremia: Improved-follow.  Anxiety/depression/agitation: Stable this morning-calm-quiet and answered most of my questions appropriately.  Continue Paxil and BuSpar.  Maintain delirium precautions.  Due to lethargy/sedation-no longer on Haldol and Seroquel.  GERD: Continue PPI  Thrombocytopenia:  resolved.  Generalized weakness/debility: Secondary to acute illness-Per prior note--not able  to participate in therapy. Requiring mechanical lift to chair.  Chronic pain:-Scheduled  Tylenol 1000 mg 3 times daily.  No longer onoxycodone and reduced IV fentanyl due to AMS.  Goals of care/Ethical issues-complex medical issue with guarded prognosis. Patient refused HD previously but deemed to lack capacity by psych. Daughter wishes her to be full code with full scope of care including dialysis.Continued on HD with the help of medications and family.Palliative care following.Ethics involved previously.   Nutrition Problem: Nutrition Problem: Inadequate oral intake Etiology: poor appetite, acute illness Signs/Symptoms: meal completion < 25% Interventions: Tube feeding, Nepro shake  Obesity: Estimated body mass index is 33.87 kg/m as calculated from the following:   Height as of this encounter: '5\' 4"'$  (1.626 m).   Weight as of this encounter: 89.5 kg.   DVT prophylaxis: SQ heparin  Diet: Diet Order            DIET DYS 2 Room service appropriate? No; Fluid consistency: Thin  Diet effective now               Code Status: Full code   Family Communication: Spoke to daughter on 5/3-we will update on 5/5  Procedures : 3/5 IJ vas cath-> 4/22 right-sided thoracocentesis>>  Significant studies: Echo 10/24/2019 > LVEF 55%, Grade 1 DD. RV systolic function normal.  Echo 3/2: LVEF 65-70%, grade II dysfunction. RVSP  Consults: Nephrology PCCM Psychiatry Palliative care Interventional radiology Ethics  Disposition Plan:  SNF when ready for discharge-awaiting bed. Status is: Inpatient  Remains inpatient appropriate because:Inpatient level of care appropriate due to severity of illness   Dispo: The patient is from: [facility]              Anticipated d/c is to: SNF              Anticipated d/c date is: > 3 days              Patient currently is not medically stable to d/c.   Antimicrobial agents: Anti-infectives (From admission, onward)   Start     Dose/Rate  Route Frequency Ordered Stop   02/28/20 1500  cefTRIAXone (ROCEPHIN) 2 g in sodium chloride 0.9 % 100 mL IVPB     2 g 200 mL/hr over 30 Minutes Intravenous Every 24 hours 02/28/20 1404 03/05/20 2359   02/26/20 1300  cefTRIAXone (ROCEPHIN) 1 g in sodium chloride 0.9 % 100 mL IVPB  Status:  Discontinued     1 g 200 mL/hr over 30 Minutes Intravenous Daily 02/26/20 1201 02/27/20 0930   02/16/20 1230  cefTRIAXone (ROCEPHIN) 2 g in sodium chloride 0.9 % 100 mL IVPB     2 g 200 mL/hr over 30 Minutes Intravenous Every 24 hours 02/16/20 1127 02/20/20 0941   02/16/20 1230  azithromycin (ZITHROMAX) 500 mg in sodium chloride 0.9 % 250 mL IVPB     500 mg 250 mL/hr over 60 Minutes Intravenous Every 24 hours 02/16/20 1127 02/20/20 1140   01/10/20 0930  vancomycin (VANCOCIN) IVPB 1000 mg/200 mL premix  Status:  Discontinued     1,000 mg 200 mL/hr over 60 Minutes Intravenous  Once 01/10/20 0915 01/13/20 0623   01/06/20 2200  ceFEPIme (MAXIPIME) 2 g in sodium chloride 0.9 % 100 mL IVPB     2 g 200 mL/hr over 30 Minutes Intravenous Every 24 hours 01/05/20 2251 01/11/20 2234   01/05/20 2200  ceFEPIme (MAXIPIME) 2 g in sodium chloride 0.9 % 100 mL IVPB     2 g 200 mL/hr over 30 Minutes Intravenous  Once 01/05/20 2148 01/06/20 0015  01/05/20 2200  metroNIDAZOLE (FLAGYL) IVPB 500 mg     500 mg 100 mL/hr over 60 Minutes Intravenous  Once 01/05/20 2148 01/06/20 0015   01/05/20 2200  vancomycin (VANCOCIN) IVPB 1000 mg/200 mL premix  Status:  Discontinued     1,000 mg 200 mL/hr over 60 Minutes Intravenous  Once 01/05/20 2148 01/05/20 2151   01/05/20 2200  vancomycin (VANCOREADY) IVPB 2000 mg/400 mL  Status:  Discontinued     2,000 mg 200 mL/hr over 120 Minutes Intravenous  Once 01/05/20 2151 01/05/20 2223       Time spent: 25 minutes-Greater than 50% of this time was spent in counseling, explanation of diagnosis, planning of further management, and coordination of care.  MEDICATIONS: Scheduled Meds: .  acetaminophen (TYLENOL) oral liquid 160 mg/5 mL  1,000 mg Per Tube Q8H  . busPIRone  5 mg Per Tube BID  . calcium acetate  667 mg Oral TID WC  . camphor-menthol   Topical QID  . chlorhexidine gluconate (MEDLINE KIT)  15 mL Mouth Rinse BID  . Chlorhexidine Gluconate Cloth  6 each Topical Q0600  . darbepoetin (ARANESP) injection - DIALYSIS  200 mcg Subcutaneous Q Wed-1800  . feeding supplement (NEPRO CARB STEADY)  1,000 mL Per Tube Q24H  . feeding supplement (PRO-STAT SUGAR FREE 64)  30 mL Per Tube TID  . heparin injection (subcutaneous)  5,000 Units Subcutaneous Q8H  . heparin sodium (porcine)      . insulin aspart  0-9 Units Subcutaneous Q4H  . insulin glargine  8 Units Subcutaneous QHS  . lactulose  20 g Per Tube Daily  . pantoprazole sodium  40 mg Per Tube BID  . PARoxetine  40 mg Per Tube Daily  . sodium chloride flush  10-40 mL Intracatheter Q12H   Continuous Infusions: . ferric gluconate (FERRLECIT/NULECIT) IV Stopped (03/08/20 1430)   PRN Meds:.feeding supplement (NEPRO CARB STEADY), guaiFENesin, metoprolol tartrate, ondansetron (ZOFRAN) IV, phenol, promethazine, sodium chloride flush   PHYSICAL EXAM: Vital signs: Vitals:   03/09/20 0252 03/09/20 0407 03/09/20 0742 03/09/20 1017  BP: (!) 122/54  (!) 138/58   Pulse: 72  80   Resp: 20  20   Temp: 97.8 F (36.6 C) 97.7 F (36.5 C) 98.6 F (37 C) 98.5 F (36.9 C)  TempSrc: Oral Oral Oral   SpO2: 98%  100%   Weight: 89.5 kg     Height:       Filed Weights   03/07/20 0500 03/08/20 2345 03/09/20 0252  Weight: 89.2 kg 91.9 kg 89.5 kg   Body mass index is 33.87 kg/m.   Gen Exam:Alert awake-not in any distress HEENT:atraumatic, normocephalic Chest: B/L clear to auscultation anteriorly CVS:S1S2 regular Abdomen:soft non tender, non distended Extremities:no edema Neurology: Appears to have significant amount of debility/generalized weakness but seems to be moving all 4 extremities Skin: no rash  I have personally  reviewed following labs and imaging studies  LABORATORY DATA: CBC: Recent Labs  Lab 03/03/20 0515 03/05/20 0339 03/08/20 0418 03/09/20 0152  WBC 8.7 8.1 4.9 4.6  NEUTROABS 6.8  --   --   --   HGB 8.6* 8.3* 8.1* 7.9*  HCT 30.7* 29.8* 29.2* 29.3*  MCV 97.5 97.1 97.0 99.7  PLT PLATELET CLUMPS NOTED ON SMEAR, UNABLE TO ESTIMATE 175 167 329    Basic Metabolic Panel: Recent Labs  Lab 03/03/20 0515 03/03/20 0515 03/04/20 0416 03/05/20 0339 03/06/20 0508 03/08/20 0418 03/09/20 0152  NA 138   < > 136 135 136 136  134*  K 3.7   < > 3.4* 3.6 3.4* 3.9 3.8  CL 100   < > 97* 96* 98 100 94*  CO2 24   < > '26 24 23 26 26  '$ GLUCOSE 144*   < > 115* 88 94 75 102*  BUN 52*   < > 71* 87* 42* 78* 90*  CREATININE 1.68*   < > 2.04* 2.25* 1.28* 1.86* 2.14*  CALCIUM 9.1   < > 9.2 9.5 8.3* 9.4 9.7  MG 2.5*  --   --   --   --   --   --   PHOS 4.4   < > 5.4* 6.1* 3.0 5.2* 5.5*   < > = values in this interval not displayed.    GFR: Estimated Creatinine Clearance: 26.1 mL/min (A) (by C-G formula based on SCr of 2.14 mg/dL (H)).  Liver Function Tests: Recent Labs  Lab 03/03/20 0515 03/03/20 0515 03/04/20 0416 03/05/20 0339 03/06/20 0508 03/08/20 0418 03/09/20 0152  AST 29  --   --   --   --   --   --   ALT 25  --   --   --   --   --   --   ALKPHOS 221*  --   --   --   --   --   --   BILITOT 0.4  --   --   --   --   --   --   PROT 6.2*  --   --   --   --   --   --   ALBUMIN 2.4*   < > 2.5* 2.4* 2.2* 2.3* 2.3*   < > = values in this interval not displayed.   No results for input(s): LIPASE, AMYLASE in the last 168 hours. No results for input(s): AMMONIA in the last 168 hours.  Coagulation Profile: No results for input(s): INR, PROTIME in the last 168 hours.  Cardiac Enzymes: No results for input(s): CKTOTAL, CKMB, CKMBINDEX, TROPONINI in the last 168 hours.  BNP (last 3 results) No results for input(s): PROBNP in the last 8760 hours.  Lipid Profile: No results for input(s):  CHOL, HDL, LDLCALC, TRIG, CHOLHDL, LDLDIRECT in the last 72 hours.  Thyroid Function Tests: No results for input(s): TSH, T4TOTAL, FREET4, T3FREE, THYROIDAB in the last 72 hours.  Anemia Panel: No results for input(s): VITAMINB12, FOLATE, FERRITIN, TIBC, IRON, RETICCTPCT in the last 72 hours.  Urine analysis:    Component Value Date/Time   COLORURINE YELLOW 02/25/2020 1600   APPEARANCEUR TURBID (A) 02/25/2020 1600   LABSPEC 1.021 02/25/2020 1600   PHURINE 5.0 02/25/2020 1600   GLUCOSEU NEGATIVE 02/25/2020 1600   HGBUR MODERATE (A) 02/25/2020 1600   BILIRUBINUR NEGATIVE 02/25/2020 1600   KETONESUR NEGATIVE 02/25/2020 1600   PROTEINUR 100 (A) 02/25/2020 1600   NITRITE NEGATIVE 02/25/2020 1600   LEUKOCYTESUR MODERATE (A) 02/25/2020 1600    Sepsis Labs: Lactic Acid, Venous    Component Value Date/Time   LATICACIDVEN 0.7 02/24/2020 1823    MICROBIOLOGY: Recent Results (from the past 240 hour(s))  MRSA PCR Screening     Status: Abnormal   Collection Time: 03/03/20 11:00 AM   Specimen: Nasopharyngeal  Result Value Ref Range Status   MRSA by PCR POSITIVE (A) NEGATIVE Final    Comment:        The GeneXpert MRSA Assay (FDA approved for NASAL specimens only), is one component of a comprehensive MRSA colonization surveillance program. It  is not intended to diagnose MRSA infection nor to guide or monitor treatment for MRSA infections. RESULT CALLED TO, READ BACK BY AND VERIFIED WITH: Jefferson Fuel RN 14:35 03/03/20 ([email or web address]) Performed at [facility], [place] [address] [place], [place] [address]   SARS CORONAVIRUS 2 (TAT 6-24 HRS) Nasopharyngeal Nasopharyngeal Swab     Status: None   Collection Time: 03/08/20  4:16 PM   Specimen: Nasopharyngeal Swab  Result Value Ref Range Status   SARS Coronavirus 2 NEGATIVE NEGATIVE Final    Comment: (NOTE) SARS-CoV-2 target nucleic acids are NOT DETECTED. The SARS-CoV-2 RNA is generally detectable in upper and lower respiratory  specimens during the acute phase of infection. Negative results do not preclude SARS-CoV-2 infection, do not rule out co-infections with other pathogens, and should not be used as the sole basis for treatment or other patient management decisions. Negative results must be combined with clinical observations, patient history, and epidemiological information. The expected result is Negative. Fact Sheet for Patients: SugarRoll.be Fact Sheet for Healthcare Providers: https://www.woods-mathews.com/ This test is not yet approved or cleared by the [place] FDA and  has been authorized for detection and/or diagnosis of SARS-CoV-2 by FDA under an Emergency Use Authorization (EUA). This EUA will remain  in effect (meaning this test can be used) for the duration of the COVID-19 declaration under Section 56 4(b)(1) of the Act, 21 U.S.C. section 360bbb-3(b)(1), unless the authorization is terminated or revoked sooner. Performed at [facility], [place] [address], [place], [place] [address]     RADIOLOGY STUDIES/RESULTS: No results found.   LOS: 16 days   Oren Binet, MD  [facility]    To contact the attending provider between 7A-7P or the covering provider during after hours 7P-7A, please log into the web site [email or web address] and access using universal [place] password for that web site. If you do not have the password, please call the hospital operator.  03/09/2020, 10:42 AM

## 2020-03-09 NOTE — Progress Notes (Signed)
Pt refuses to wear bipap QHS.  Pt is in no distress

## 2020-03-09 NOTE — Progress Notes (Signed)
Hypoglycemic Event  CBG: 69  Treatment: Given 120cc juice  Symptoms:N/A Follow-up CBG: Time 0817 CBG Result:115  Possible Reasons for Event: Poor PO intake  Comments/MD notified: MD adjusted Lantus dose    [name]  Kathlen Brunswick

## 2020-03-10 LAB — CBC
HCT: 31 % — ABNORMAL LOW (ref 36.0–46.0)
Hemoglobin: 8.4 g/dL — ABNORMAL LOW (ref 12.0–15.0)
MCH: 26.8 pg (ref 26.0–34.0)
MCHC: 27.1 g/dL — ABNORMAL LOW (ref 30.0–36.0)
MCV: 99 fL (ref 80.0–100.0)
Platelets: 182 10*3/uL (ref 150–400)
RBC: 3.13 MIL/uL — ABNORMAL LOW (ref 3.87–5.11)
RDW: 18 % — ABNORMAL HIGH (ref 11.5–15.5)
WBC: 4.8 10*3/uL (ref 4.0–10.5)
nRBC: 0 % (ref 0.0–0.2)

## 2020-03-10 LAB — GLUCOSE, CAPILLARY
Glucose-Capillary: 107 mg/dL — ABNORMAL HIGH (ref 70–99)
Glucose-Capillary: 110 mg/dL — ABNORMAL HIGH (ref 70–99)
Glucose-Capillary: 118 mg/dL — ABNORMAL HIGH (ref 70–99)
Glucose-Capillary: 120 mg/dL — ABNORMAL HIGH (ref 70–99)
Glucose-Capillary: 121 mg/dL — ABNORMAL HIGH (ref 70–99)
Glucose-Capillary: 126 mg/dL — ABNORMAL HIGH (ref 70–99)
Glucose-Capillary: 81 mg/dL (ref 70–99)

## 2020-03-10 LAB — RENAL FUNCTION PANEL
Albumin: 2.3 g/dL — ABNORMAL LOW (ref 3.5–5.0)
Anion gap: 13 (ref 5–15)
BUN: 62 mg/dL — ABNORMAL HIGH (ref 8–23)
CO2: 28 mmol/L (ref 22–32)
Calcium: 9.4 mg/dL (ref 8.9–10.3)
Chloride: 94 mmol/L — ABNORMAL LOW (ref 98–111)
Creatinine, Ser: 1.64 mg/dL — ABNORMAL HIGH (ref 0.44–1.00)
GFR calc Af Amer: 36 mL/min — ABNORMAL LOW (ref >60)
GFR calc non Af Amer: 31 mL/min — ABNORMAL LOW (ref >60)
Glucose, Bld: 119 mg/dL — ABNORMAL HIGH (ref 70–99)
Phosphorus: 4.1 mg/dL (ref 2.5–4.6)
Potassium: 4 mmol/L (ref 3.5–5.1)
Sodium: 135 mmol/L (ref 135–145)

## 2020-03-10 MED ORDER — HEPARIN SODIUM (PORCINE) 1000 UNIT/ML IJ SOLN
INTRAMUSCULAR | Status: AC
  Filled 2020-03-10: qty 4

## 2020-03-10 MED ORDER — HEPARIN SODIUM (PORCINE) 1000 UNIT/ML DIALYSIS
20.0000 [IU]/kg | INTRAMUSCULAR | Status: DC | PRN
  Administered 2020-03-10: 1800 [IU] via INTRAVENOUS_CENTRAL

## 2020-03-10 NOTE — Progress Notes (Signed)
Patient went to dialysis. No acute distress noted at this time.

## 2020-03-10 NOTE — Progress Notes (Signed)
PROGRESS NOTE        PATIENT DETAILS Name: Bailey Cox Age: 74 y.o. Sex: female Date of Birth: June 17, 1946 Admit Date: 01/05/2020 Admitting Physician Kandis Cocking, MD NOM:[id number], No Pcp Per  Brief Narrative: 74 year old female with history of cholecystitis status post cholecystectomy in 9/20 at [facility]. She was discharged to SNF but rehospitalized to [facility] and had bile duct stent. Discharged back to SNF but readmitted with Ogilive syndrome. Hospital course complicated by fluid overload and transferred to [facility] for dialysis. At [facility], developed respiratory failure requiring vent support. She also suffered a cardiac arrest during EGD. She underwent trach,she was eventually discharged to [facility] on 3/1 after prolonged hospitalization at [facility] but deemed too sick, and sent to [facility] due to fluid overload and acidosis.  Patient was initially admitted to ICU. Required CRRT 3/9-3/12. Transfer to floor on 3/14. She pulled her trach. Now on oxygen by nasal cannula. She is on Intermitted HD via [place]. Has refused dialysis at times but deemed to lack capacity. Family wants full scope of care including full code and dialysis. She has been noncompliant with care and hemodialysis but lacks capacity. Ethics committee meeting held on 4/6. The plan is to continue full scope of care per family's wish. Finally shetolerated HD in recliner chair on 4/19 without sedating medications. She will be discharged to SNF once she had outpatient HD spot andcontinues to tolerate HD on recliner chair.  She's been started on lactulose for elevated ammonia with concern for hepatic encephalopathy as well as nightly bipap for acute hypercapneic resp failure with altered mental status.    Subjective: Lying comfortably in bed-no major complaints-denies any chest pain or shortness of breath.  Per nursing staff-no major events.  Assessment/Plan: Acute metabolic  encephalopathy: Multifactorial etiology-including AKI, hypercarbia, hospital/ICU delirium, hyperammonemia, pneumonia.  Encephalopathy is improved-although waxing and waning at times.  TSH/vitamin B12 within normal limits-RPR nonreactive.  Continue supportive care.  Septic shock secondary to UTI and PNA: Sepsis physiology has resolved-has completed a course of antimicrobial therapy.  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  Continue BiPAP nightly-wean oxygen as able.  Avoid sedating medications like narcotics/benzodiazepines as much as possible.  Pseudomonas aeruginosa/aspiration pneumonia with right-sided parapneumonic effusion: Has completed multiple courses of antibiotics-see below.  Patient is s/p  thoracocentesis by PCCM on 4/22-plans are to continue with Rocephin until 5/1.  Maintain aspiration precautions.  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  Noncompliant with HD but lacks capacity.  DM-2 with hyperglycemia ( A1c 5.7%): No further hypoglycemic episodes-continue Lantus 8 units and SSI.    CBG (last 3)  Recent Labs    03/10/20 0804 03/10/20 1314 03/10/20 1552  GLUCAP 121* 81 110*    Essential hypertension: BP controlled-not on scheduled antihypertensives-remains on as needed metoprolol.   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  Hemoglobin stable-follow periodically.   Hypokalemia: Repleted  Hyponatremia: Improved-follow.  Anxiety/depression/agitation: Stable this morning-calm-quiet and answered most of my questions appropriately.  Continue Paxil and BuSpar.  Maintain delirium precautions.  Due to lethargy/sedation-no longer on Haldol and Seroquel.  GERD: Continue PPI  Thrombocytopenia:  resolved.  Generalized weakness/debility: Secondary to acute  illness-Per prior note--not able to participate in therapy. Requiring mechanical  lift to chair.  Chronic pain:-Scheduled Tylenol 1000 mg 3 times daily.  No longer onoxycodone and reduced IV fentanyl due to AMS.  Goals of care/Ethical issues-complex medical issue with guarded prognosis. Patient refused HD previously but deemed to lack capacity by psych. Daughter wishes her to be full code with full scope of care including dialysis.Continued on HD with the help of medications and family.Palliative care following.Ethics involved previously.   Nutrition Problem: Nutrition Problem: Inadequate oral intake Etiology: poor appetite, acute illness Signs/Symptoms: meal completion < 25% Interventions: Tube feeding, Nepro shake  Obesity: Estimated body mass index is 33.79 kg/m as calculated from the following:   Height as of this encounter: '5\' 4"'$  (1.626 m).   Weight as of this encounter: 89.3 kg.   DVT prophylaxis: SQ heparin  Diet: Diet Order            DIET DYS 2 Room service appropriate? No; Fluid consistency: Thin  Diet effective now               Code Status: Full code   Family Communication: Spoke to daughter on 5/5  Procedures : 3/5 IJ vas cath-> 4/22 right-sided thoracocentesis>>  Significant studies: Echo 10/24/2019 > LVEF 55%, Grade 1 DD. RV systolic function normal.  Echo 3/2: LVEF 65-70%, grade II dysfunction. RVSP  Consults: Nephrology PCCM Psychiatry Palliative care Interventional radiology Ethics  Disposition Plan:  SNF when ready for discharge-awaiting bed. Status is: Inpatient  Remains inpatient appropriate because:Inpatient level of care appropriate due to severity of illness   Dispo: The patient is from: [facility]              Anticipated d/c is to: SNF              Anticipated d/c date is: > 3 days              Patient currently is not medically stable to d/c.   Antimicrobial agents: Anti-infectives (From admission, onward)   Start      Dose/Rate Route Frequency Ordered Stop   02/28/20 1500  cefTRIAXone (ROCEPHIN) 2 g in sodium chloride 0.9 % 100 mL IVPB     2 g 200 mL/hr over 30 Minutes Intravenous Every 24 hours 02/28/20 1404 03/05/20 2359   02/26/20 1300  cefTRIAXone (ROCEPHIN) 1 g in sodium chloride 0.9 % 100 mL IVPB  Status:  Discontinued     1 g 200 mL/hr over 30 Minutes Intravenous Daily 02/26/20 1201 02/27/20 0930   02/16/20 1230  cefTRIAXone (ROCEPHIN) 2 g in sodium chloride 0.9 % 100 mL IVPB     2 g 200 mL/hr over 30 Minutes Intravenous Every 24 hours 02/16/20 1127 02/20/20 0941   02/16/20 1230  azithromycin (ZITHROMAX) 500 mg in sodium chloride 0.9 % 250 mL IVPB     500 mg 250 mL/hr over 60 Minutes Intravenous Every 24 hours 02/16/20 1127 02/20/20 1140   01/10/20 0930  vancomycin (VANCOCIN) IVPB 1000 mg/200 mL premix  Status:  Discontinued     1,000 mg 200 mL/hr over 60 Minutes Intravenous  Once 01/10/20 0915 01/13/20 0623   01/06/20 2200  ceFEPIme (MAXIPIME) 2 g in sodium chloride 0.9 % 100 mL IVPB     2 g 200 mL/hr over 30 Minutes Intravenous Every 24 hours 01/05/20 2251 01/11/20 2234   01/05/20 2200  ceFEPIme (MAXIPIME) 2 g in sodium chloride 0.9 % 100 mL IVPB     2 g 200 mL/hr over 30 Minutes Intravenous  Once 01/05/20 2148 01/06/20  0015   01/05/20 2200  metroNIDAZOLE (FLAGYL) IVPB 500 mg     500 mg 100 mL/hr over 60 Minutes Intravenous  Once 01/05/20 2148 01/06/20 0015   01/05/20 2200  vancomycin (VANCOCIN) IVPB 1000 mg/200 mL premix  Status:  Discontinued     1,000 mg 200 mL/hr over 60 Minutes Intravenous  Once 01/05/20 2148 01/05/20 2151   01/05/20 2200  vancomycin (VANCOREADY) IVPB 2000 mg/400 mL  Status:  Discontinued     2,000 mg 200 mL/hr over 120 Minutes Intravenous  Once 01/05/20 2151 01/05/20 2223       Time spent: 25 minutes-Greater than 50% of this time was spent in counseling, explanation of diagnosis, planning of further management, and coordination of care.  MEDICATIONS: Scheduled  Meds: . acetaminophen (TYLENOL) oral liquid 160 mg/5 mL  1,000 mg Per Tube Q8H  . busPIRone  5 mg Per Tube BID  . calcium acetate  667 mg Oral TID WC  . camphor-menthol   Topical QID  . chlorhexidine gluconate (MEDLINE KIT)  15 mL Mouth Rinse BID  . Chlorhexidine Gluconate Cloth  6 each Topical Q0600  . darbepoetin (ARANESP) injection - DIALYSIS  200 mcg Subcutaneous Q Wed-1800  . feeding supplement (NEPRO CARB STEADY)  1,000 mL Per Tube Q24H  . feeding supplement (PRO-STAT SUGAR FREE 64)  30 mL Per Tube TID  . heparin injection (subcutaneous)  5,000 Units Subcutaneous Q8H  . heparin sodium (porcine)      . insulin aspart  0-9 Units Subcutaneous Q4H  . insulin glargine  8 Units Subcutaneous QHS  . lactulose  20 g Per Tube Daily  . pantoprazole sodium  40 mg Per Tube BID  . PARoxetine  40 mg Per Tube Daily  . sodium chloride flush  10-40 mL Intracatheter Q12H   Continuous Infusions: . ferric gluconate (FERRLECIT/NULECIT) IV 125 mg (03/10/20 1002)   PRN Meds:.feeding supplement (NEPRO CARB STEADY), guaiFENesin, metoprolol tartrate, ondansetron (ZOFRAN) IV, phenol, promethazine, sodium chloride flush   PHYSICAL EXAM: Vital signs: Vitals:   03/10/20 1100 03/10/20 1130 03/10/20 1156 03/10/20 1308  BP: 140/70 (!) 123/56 (!) 136/58 136/85  Pulse: 76 73 78   Resp: '17 18 16 20  '$ Temp:   98.4 F (36.9 C) 97.9 F (36.6 C)  TempSrc:   Oral Oral  SpO2: 100% 100% 100% 95%  Weight:      Height:       Filed Weights   03/09/20 0252 03/10/20 0400  Weight: 89.5 kg 89.3 kg   Body mass index is 33.79 kg/m.   Gen Exam:Alert awake-not in any distress HEENT:atraumatic, normocephalic Chest: B/L clear to auscultation anteriorly CVS:S1S2 regular Abdomen:soft non tender, non distended Extremities:no edema Neurology: Appears to have significant amount of debility/generalized weakness but seems to be moving all 4 extremities Skin: no rash  I have personally reviewed following labs and  imaging studies  LABORATORY DATA: CBC: Recent Labs  Lab 03/05/20 0339 03/08/20 0418 03/09/20 0152 03/10/20 0925  WBC 8.1 4.9 4.6 4.8  HGB 8.3* 8.1* 7.9* 8.4*  HCT 29.8* 29.2* 29.3* 31.0*  MCV 97.1 97.0 99.7 99.0  PLT 175 167 171 498    Basic Metabolic Panel: Recent Labs  Lab 03/05/20 0339 03/06/20 0508 03/08/20 0418 03/09/20 0152 03/10/20 0805  NA 135 136 136 134* 135  K 3.6 3.4* 3.9 3.8 4.0  CL 96* 98 100 94* 94*  CO2 '24 23 26 26 28  '$ GLUCOSE 88 94 75 102* 119*  BUN 87* 42* 78* 90*  62*  CREATININE 2.25* 1.28* 1.86* 2.14* 1.64*  CALCIUM 9.5 8.3* 9.4 9.7 9.4  PHOS 6.1* 3.0 5.2* 5.5* 4.1    GFR: Estimated Creatinine Clearance: 34 mL/min (A) (by C-G formula based on SCr of 1.64 mg/dL (H)).  Liver Function Tests: Recent Labs  Lab 03/05/20 0339 03/06/20 0508 03/08/20 0418 03/09/20 0152 03/10/20 0805  ALBUMIN 2.4* 2.2* 2.3* 2.3* 2.3*   No results for input(s): LIPASE, AMYLASE in the last 168 hours. No results for input(s): AMMONIA in the last 168 hours.  Coagulation Profile: No results for input(s): INR, PROTIME in the last 168 hours.  Cardiac Enzymes: No results for input(s): CKTOTAL, CKMB, CKMBINDEX, TROPONINI in the last 168 hours.  BNP (last 3 results) No results for input(s): PROBNP in the last 8760 hours.  Lipid Profile: No results for input(s): CHOL, HDL, LDLCALC, TRIG, CHOLHDL, LDLDIRECT in the last 72 hours.  Thyroid Function Tests: No results for input(s): TSH, T4TOTAL, FREET4, T3FREE, THYROIDAB in the last 72 hours.  Anemia Panel: No results for input(s): VITAMINB12, FOLATE, FERRITIN, TIBC, IRON, RETICCTPCT in the last 72 hours.  Urine analysis:    Component Value Date/Time   COLORURINE YELLOW 02/25/2020 1600   APPEARANCEUR TURBID (A) 02/25/2020 1600   LABSPEC 1.021 02/25/2020 1600   PHURINE 5.0 02/25/2020 1600   GLUCOSEU NEGATIVE 02/25/2020 1600   HGBUR MODERATE (A) 02/25/2020 1600   BILIRUBINUR NEGATIVE 02/25/2020 1600   KETONESUR  NEGATIVE 02/25/2020 1600   PROTEINUR 100 (A) 02/25/2020 1600   NITRITE NEGATIVE 02/25/2020 1600   LEUKOCYTESUR MODERATE (A) 02/25/2020 1600    Sepsis Labs: Lactic Acid, Venous    Component Value Date/Time   LATICACIDVEN 0.7 02/24/2020 1823    MICROBIOLOGY: Recent Results (from the past 240 hour(s))  MRSA PCR Screening     Status: Abnormal   Collection Time: 03/03/20 11:00 AM   Specimen: Nasopharyngeal  Result Value Ref Range Status   MRSA by PCR POSITIVE (A) NEGATIVE Final    Comment:        The GeneXpert MRSA Assay (FDA approved for NASAL specimens only), is one component of a comprehensive MRSA colonization surveillance program. It is not intended to diagnose MRSA infection nor to guide or monitor treatment for MRSA infections. RESULT CALLED TO, READ BACK BY AND VERIFIED WITH: Jefferson Fuel RN 14:35 03/03/20 ([email or web address]) Performed at [facility], [place] [address] [place], [place] [address]   SARS CORONAVIRUS 2 (TAT 6-24 HRS) Nasopharyngeal Nasopharyngeal Swab     Status: None   Collection Time: 03/08/20  4:16 PM   Specimen: Nasopharyngeal Swab  Result Value Ref Range Status   SARS Coronavirus 2 NEGATIVE NEGATIVE Final    Comment: (NOTE) SARS-CoV-2 target nucleic acids are NOT DETECTED. The SARS-CoV-2 RNA is generally detectable in upper and lower respiratory specimens during the acute phase of infection. Negative results do not preclude SARS-CoV-2 infection, do not rule out co-infections with other pathogens, and should not be used as the sole basis for treatment or other patient management decisions. Negative results must be combined with clinical observations, patient history, and epidemiological information. The expected result is Negative. Fact Sheet for Patients: SugarRoll.be Fact Sheet for Healthcare Providers: https://www.woods-mathews.com/ This test is not yet approved or cleared by the [place] FDA and  has  been authorized for detection and/or diagnosis of SARS-CoV-2 by FDA under an Emergency Use Authorization (EUA). This EUA will remain  in effect (meaning this test can be used) for the duration of the COVID-19 declaration under  Section 56 4(b)(1) of the Act, 21 U.S.C. section 360bbb-3(b)(1), unless the authorization is terminated or revoked sooner. Performed at [facility], [place] [address] [place], [place] [address]     RADIOLOGY STUDIES/RESULTS: No results found.   LOS: 65 days   Oren Binet, MD  [facility]    To contact the attending provider between 7A-7P or the covering provider during after hours 7P-7A, please log into the web site [email or web address] and access using universal [place] password for that web site. If you do not have the password, please call the hospital operator.  03/10/2020, 5:01 PM

## 2020-03-10 NOTE — Procedures (Signed)
I was present at this dialysis session. I have reviewed the session itself and made appropriate changes.   Vital signs in last 24 hours:  Temp:  [98.2 F (36.8 C)-98.7 F (37.1 C)] 98.6 F (37 C) (05/05 0845) Pulse Rate:  [76-86] 76 (05/05 1000) Resp:  [16-20] 17 (05/05 1000) BP: (123-163)/(58-75) 123/62 (05/05 1000) SpO2:  [93 %-100 %] 100 % (05/05 1000) Weight:  [89.3 kg] 89.3 kg (05/05 0400) Weight change: -2.6 kg Filed Weights   03/08/20 2345 03/09/20 0252 03/10/20 0400  Weight: 91.9 kg 89.5 kg 89.3 kg    Recent Labs  Lab 03/10/20 0805  NA 135  K 4.0  CL 94*  CO2 28  GLUCOSE 119*  BUN 62*  CREATININE 1.64*  CALCIUM 9.4  PHOS 4.1    Recent Labs  Lab 03/08/20 0418 03/09/20 0152 03/10/20 0925  WBC 4.9 4.6 4.8  HGB 8.1* 7.9* 8.4*  HCT 29.2* 29.3* 31.0*  MCV 97.0 99.7 99.0  PLT 167 171 182    Scheduled Meds: . acetaminophen (TYLENOL) oral liquid 160 mg/5 mL  1,000 mg Per Tube Q8H  . busPIRone  5 mg Per Tube BID  . calcium acetate  667 mg Oral TID WC  . camphor-menthol   Topical QID  . chlorhexidine gluconate (MEDLINE KIT)  15 mL Mouth Rinse BID  . Chlorhexidine Gluconate Cloth  6 each Topical Q0600  . darbepoetin (ARANESP) injection - DIALYSIS  200 mcg Subcutaneous Q Wed-1800  . feeding supplement (NEPRO CARB STEADY)  1,000 mL Per Tube Q24H  . feeding supplement (PRO-STAT SUGAR FREE 64)  30 mL Per Tube TID  . heparin injection (subcutaneous)  5,000 Units Subcutaneous Q8H  . insulin aspart  0-9 Units Subcutaneous Q4H  . insulin glargine  8 Units Subcutaneous QHS  . lactulose  20 g Per Tube Daily  . pantoprazole sodium  40 mg Per Tube BID  . PARoxetine  40 mg Per Tube Daily  . sodium chloride flush  10-40 mL Intracatheter Q12H   Continuous Infusions: . ferric gluconate (FERRLECIT/NULECIT) IV 125 mg (03/10/20 1002)   PRN Meds:.feeding supplement (NEPRO CARB STEADY), guaiFENesin, heparin, metoprolol tartrate, ondansetron (ZOFRAN) IV, phenol, promethazine,  sodium chloride flush   Donetta Potts,  MD 03/10/2020, 11:18 AM

## 2020-03-10 NOTE — Progress Notes (Signed)
Patent refused BIPAP for the night. Patient wearing oxygen set at 2lpm with Sp02=96%. Patient does not appear to be in any distress at this time.

## 2020-03-10 NOTE — Progress Notes (Signed)
Patient back from dialysis. Alert and oriented x 2, no acute distress noted, no complaints. VS stable. Will continue to monitor.  

## 2020-03-11 LAB — GLUCOSE, CAPILLARY
Glucose-Capillary: 100 mg/dL — ABNORMAL HIGH (ref 70–99)
Glucose-Capillary: 114 mg/dL — ABNORMAL HIGH (ref 70–99)
Glucose-Capillary: 120 mg/dL — ABNORMAL HIGH (ref 70–99)
Glucose-Capillary: 101 mg/dL — ABNORMAL HIGH (ref 70–99)
Glucose-Capillary: 88 mg/dL (ref 70–99)
Glucose-Capillary: 122 mg/dL — ABNORMAL HIGH (ref 70–99)

## 2020-03-11 LAB — RENAL FUNCTION PANEL
Albumin: 2.5 g/dL — ABNORMAL LOW (ref 3.5–5.0)
Anion gap: 6 (ref 5–15)
BUN: 40 mg/dL — ABNORMAL HIGH (ref 8–23)
CO2: 28 mmol/L (ref 22–32)
Calcium: 8.7 mg/dL — ABNORMAL LOW (ref 8.9–10.3)
Chloride: 98 mmol/L (ref 98–111)
Creatinine, Ser: 1.33 mg/dL — ABNORMAL HIGH (ref 0.44–1.00)
GFR calc Af Amer: 46 mL/min — ABNORMAL LOW (ref >60)
GFR calc non Af Amer: 40 mL/min — ABNORMAL LOW (ref >60)
Glucose, Bld: 89 mg/dL (ref 70–99)
Phosphorus: 3.4 mg/dL (ref 2.5–4.6)
Potassium: 3.7 mmol/L (ref 3.5–5.1)
Sodium: 132 mmol/L — ABNORMAL LOW (ref 135–145)

## 2020-03-11 NOTE — Progress Notes (Signed)
RT NOTE:  Pt refuses BIPAP tonight. No distress. Pt wearing 2L [place] @ this time and resting comfortably.

## 2020-03-11 NOTE — Progress Notes (Signed)
PT Cancellation Note  Patient Details Name: Bailey Cox MRN: [id number] DOB: 1946-09-22   Cancelled Treatment:    Reason Eval/Treat Not Completed: Other (comment).  Pt refused therapy stating her friend is coming over.  Asked to work with her while she waits, still refusing.  Follow up as time and pt allow.   Ramond Dial 03/11/2020, 3:23 PM   Mee Hives, PT MS Acute Rehab Dept. Number: [place]

## 2020-03-11 NOTE — Progress Notes (Signed)
Patient ID: Bailey Cox, female   DOB: [birth year], 74 y.o.   MRN: [id number] S: No events overnight.  Still awaiting on transportation from SNF to and from HD O:BP (!) 153/81 (BP Location: Right Arm)   Pulse 83   Temp 98.4 F (36.9 C) (Oral)   Resp 17   Ht '5\' 4"'$  (1.626 m)   Wt 89.9 kg   LMP  (LMP Unknown)   SpO2 100%   BMI 34.02 kg/m   Intake/Output Summary (Last 24 hours) at 03/11/2020 1138 Last data filed at 03/11/2020 0606 Gross per 24 hour  Intake 1353 ml  Output 2288 ml  Net -935 ml   Intake/Output: I/O last 3 completed shifts: In: 4081 [P.O.:275; I.V.:10; NG/GT:1128] Out: 2668 [Urine:680; KGYJE:5631]  Intake/Output this shift:  No intake/output data recorded. Weight change: 0.6 kg Gen:NAD CVS: no rub Resp: cta Abd: obese, +BS, soft/NT Ext: trace pretibial edema  Recent Labs  Lab 03/05/20 0339 03/06/20 0508 03/08/20 0418 03/09/20 0152 03/10/20 0805 03/11/20 0408  NA 135 136 136 134* 135 132*  K 3.6 3.4* 3.9 3.8 4.0 3.7  CL 96* 98 100 94* 94* 98  CO2 '24 23 26 26 28 28  '$ GLUCOSE 88 94 75 102* 119* 89  BUN 87* 42* 78* 90* 62* 40*  CREATININE 2.25* 1.28* 1.86* 2.14* 1.64* 1.33*  ALBUMIN 2.4* 2.2* 2.3* 2.3* 2.3* 2.5*  CALCIUM 9.5 8.3* 9.4 9.7 9.4 8.7*  PHOS 6.1* 3.0 5.2* 5.5* 4.1 3.4   Liver Function Tests: Recent Labs  Lab 03/09/20 0152 03/10/20 0805 03/11/20 0408  ALBUMIN 2.3* 2.3* 2.5*   No results for input(s): LIPASE, AMYLASE in the last 168 hours. No results for input(s): AMMONIA in the last 168 hours. CBC: Recent Labs  Lab 03/05/20 0339 03/05/20 0339 03/08/20 0418 03/09/20 0152 03/10/20 0925  WBC 8.1   < > 4.9 4.6 4.8  HGB 8.3*   < > 8.1* 7.9* 8.4*  HCT 29.8*   < > 29.2* 29.3* 31.0*  MCV 97.1  --  97.0 99.7 99.0  PLT 175   < > 167 171 182   < > = values in this interval not displayed.   Cardiac Enzymes: No results for input(s): CKTOTAL, CKMB, CKMBINDEX, TROPONINI in the last 168 hours. CBG: Recent Labs  Lab 03/10/20 1552  03/10/20 2005 03/10/20 2328 03/11/20 0444 03/11/20 0734  GLUCAP 110* 118* 126* 100* 114*    Iron Studies: No results for input(s): IRON, TIBC, TRANSFERRIN, FERRITIN in the last 72 hours. Studies/Results: No results found. Marland Kitchen acetaminophen (TYLENOL) oral liquid 160 mg/5 mL  1,000 mg Per Tube Q8H  . busPIRone  5 mg Per Tube BID  . calcium acetate  667 mg Oral TID WC  . camphor-menthol   Topical QID  . chlorhexidine gluconate (MEDLINE KIT)  15 mL Mouth Rinse BID  . Chlorhexidine Gluconate Cloth  6 each Topical Q0600  . darbepoetin (ARANESP) injection - DIALYSIS  200 mcg Subcutaneous Q Wed-1800  . feeding supplement (NEPRO CARB STEADY)  1,000 mL Per Tube Q24H  . feeding supplement (PRO-STAT SUGAR FREE 64)  30 mL Per Tube TID  . heparin injection (subcutaneous)  5,000 Units Subcutaneous Q8H  . insulin aspart  0-9 Units Subcutaneous Q4H  . insulin glargine  8 Units Subcutaneous QHS  . lactulose  20 g Per Tube Daily  . pantoprazole sodium  40 mg Per Tube BID  . PARoxetine  40 mg Per Tube Daily  . sodium chloride flush  10-40 mL Intracatheter  Q12H    BMET    Component Value Date/Time   NA 132 (L) 03/11/2020 0408   K 3.7 03/11/2020 0408   CL 98 03/11/2020 0408   CO2 28 03/11/2020 0408   GLUCOSE 89 03/11/2020 0408   BUN 40 (H) 03/11/2020 0408   CREATININE 1.33 (H) 03/11/2020 0408   CALCIUM 8.7 (L) 03/11/2020 0408   GFRNONAA 40 (L) 03/11/2020 0408   GFRAA 46 (L) 03/11/2020 0408   CBC    Component Value Date/Time   WBC 4.8 03/10/2020 0925   RBC 3.13 (L) 03/10/2020 0925   HGB 8.4 (L) 03/10/2020 0925   HCT 31.0 (L) 03/10/2020 0925   PLT 182 03/10/2020 0925   MCV 99.0 03/10/2020 0925   MCH 26.8 03/10/2020 0925   MCHC 27.1 (L) 03/10/2020 0925   RDW 18.0 (H) 03/10/2020 0925   LYMPHSABS 0.8 03/03/2020 0515   MONOABS 0.8 03/03/2020 0515   EOSABS 0.1 03/03/2020 0515   BASOSABS 0.1 03/03/2020 0515    Assessment/Plan:  1. OliguricAKI/CKD stage 3- due to ischemic ATN in  setting of sepsis. Unfortunately she has remained dialysis dependent since 01/09/20 and will require ongoing IHD. Now on MWF schedule for outpateint HD at [facility], [place].  1. Will need to follow for renal recovery as an outpatient 2. Using TDC 3. Remains oliguric with rapidly rising BUN. Scr rising but less than 2 likely due to lack of muscle mass and malnutrition.  4. Continue with MWF schedule. 2. Debility- continue with PT/OT at SNF 3. Anemia of CKD 4. Sepsis- due to pseudomonas aspiration pneumonia. off pressors and resolved. Off vanco 5. H/o PEA arrest 6. HFpEF- mild volume overload. Will UF as tolerated  7. DM- per primary 8. 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 Was on trach but decannulated on 01/23/20. Continue with BiPAP nightly. 9. Disposition- for discharge to SNF and arranged for outpatient HD in [place], [place], awaiting transportation issues to be resolved.   Donetta Potts, MD [employer] [phone]

## 2020-03-11 NOTE — Progress Notes (Signed)
PROGRESS NOTE        PATIENT DETAILS Name: Bailey Cox Age: 74 y.o. Sex: female Date of Birth: Mar 15, 1946 Admit Date: 01/05/2020 Admitting Physician Kandis Cocking, MD HOZ:[id number], No Pcp Per  Brief Narrative: 74 year old female with history of cholecystitis status post cholecystectomy in 9/20 at [facility]. She was discharged to SNF but rehospitalized to [facility] and had bile duct stent. Discharged back to SNF but readmitted with Ogilive syndrome. Hospital course complicated by fluid overload and transferred to [facility] for dialysis. At [facility], developed respiratory failure requiring vent support. She also suffered a cardiac arrest during EGD. She underwent trach,she was eventually discharged to [facility] on 3/1 after prolonged hospitalization at [facility] but deemed too sick, and sent to [facility] due to fluid overload and acidosis.  Patient was initially admitted to ICU. Required CRRT 3/9-3/12. Transfer to floor on 3/14. She pulled her trach. Now on oxygen by nasal cannula. She is on Intermitted HD via [place]. Has refused dialysis at times but deemed to lack capacity. Family wants full scope of care including full code and dialysis. She has been noncompliant with care and hemodialysis but lacks capacity. Ethics committee meeting held on 4/6. The plan is to continue full scope of care per family's wish. Finally shetolerated HD in recliner chair on 4/19 without sedating medications. She will be discharged to SNF once she had outpatient HD spot andcontinues to tolerate HD on recliner chair.  She's been started on lactulose for elevated ammonia with concern for hepatic encephalopathy as well as nightly bipap for acute hypercapneic resp failure with altered mental status.    Subjective: Lying comfortably in bed-no chest pain or shortness of breath.  Assessment/Plan: Acute metabolic encephalopathy: Multifactorial etiology-including AKI,  hypercarbia, hospital/ICU delirium, hyperammonemia, pneumonia.  Encephalopathy is improved-although waxing and waning at times.  TSH/vitamin B12 within normal limits-RPR nonreactive.  Continue supportive care.  Septic shock secondary to UTI and PNA: Sepsis physiology has resolved-has completed a course of antimicrobial therapy.  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  Continue BiPAP nightly-wean oxygen as able.  Avoid sedating medications like narcotics/benzodiazepines as much as possible.  Pseudomonas aeruginosa/aspiration pneumonia with right-sided parapneumonic effusion: Has completed multiple courses of antibiotics-see below.  Patient is s/p  thoracocentesis by PCCM on 4/22-plans are to continue with Rocephin until 5/1.  Maintain aspiration precautions.  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  Noncompliant with HD but lacks capacity.  DM-2 with hyperglycemia ( A1c 5.7%): No further hypoglycemic episodes-continue Lantus 8 units and SSI.    CBG (last 3)  Recent Labs    03/11/20 0444 03/11/20 0734 03/11/20 1216  GLUCAP 100* 114* 120*    Essential hypertension: BP controlled-not on scheduled antihypertensives-remains on as needed metoprolol.   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  Hemoglobin stable-follow periodically.   Hypokalemia: Repleted  Hyponatremia: Improved-follow.  Anxiety/depression/agitation: Stable this morning-calm-quiet and answered most of my questions appropriately.  Continue Paxil and BuSpar.  Maintain delirium precautions.  Due to lethargy/sedation-no longer on Haldol and Seroquel.  GERD: Continue PPI  Thrombocytopenia:  resolved.  Generalized weakness/debility: Secondary to acute illness-Per prior note--not able to participate in therapy.  Requiring mechanical lift to chair.  Chronic pain:-Scheduled Tylenol 1000 mg  3 times daily.  No longer onoxycodone and reduced IV fentanyl due to AMS.  Goals of care/Ethical issues-complex medical issue with guarded prognosis. Patient refused HD previously but deemed to lack capacity by psych. Daughter wishes her to be full code with full scope of care including dialysis.Continued on HD with the help of medications and family.Palliative care following.Ethics involved previously.   Nutrition Problem: Nutrition Problem: Inadequate oral intake Etiology: poor appetite, acute illness Signs/Symptoms: meal completion < 25% Interventions: Tube feeding, Nepro shake  Obesity: Estimated body mass index is 34.02 kg/m as calculated from the following:   Height as of this encounter: _0  (1.626 m).   Weight as of this encounter: 89.9 kg.   DVT prophylaxis: SQ heparin  Diet: Diet Order            DIET DYS 2 Room service appropriate? No; Fluid consistency: Thin  Diet effective now               Code Status: Full code   Family Communication: Spoke to daughter on 5/5-we will update on 5/7 as medical issues are stable-just awaiting SNF bed.  Procedures : 3/5 IJ vas cath-> 4/22 right-sided thoracocentesis>>  Significant studies: Echo 10/24/2019 > LVEF 55%, Grade 1 DD. RV systolic function normal.  Echo 3/2: LVEF 65-70%, grade II dysfunction. RVSP  Consults: Nephrology PCCM Psychiatry Palliative care Interventional radiology Ethics  Disposition Plan:  SNF when ready for discharge-awaiting bed. Status is: Inpatient  Remains inpatient appropriate because:Inpatient level of care appropriate due to severity of illness   Dispo: The patient is from: [facility]              Anticipated d/c is to: SNF              Anticipated d/c date is: > 3 days              Patient currently is medically stable to d/c.  Awaiting SNF bed/transportation to HD.   Antimicrobial  agents: Anti-infectives (From admission, onward)   Start     Dose/Rate Route Frequency Ordered Stop   02/28/20 1500  cefTRIAXone (ROCEPHIN) 2 g in sodium chloride 0.9 % 100 mL IVPB     2 g 200 mL/hr over 30 Minutes Intravenous Every 24 hours 02/28/20 1404 03/05/20 2359   02/26/20 1300  cefTRIAXone (ROCEPHIN) 1 g in sodium chloride 0.9 % 100 mL IVPB  Status:  Discontinued     1 g 200 mL/hr over 30 Minutes Intravenous Daily 02/26/20 1201 02/27/20 0930   02/16/20 1230  cefTRIAXone (ROCEPHIN) 2 g in sodium chloride 0.9 % 100 mL IVPB     2 g 200 mL/hr over 30 Minutes Intravenous Every 24 hours 02/16/20 1127 02/20/20 0941   02/16/20 1230  azithromycin (ZITHROMAX) 500 mg in sodium chloride 0.9 % 250 mL IVPB     500 mg 250 mL/hr over 60 Minutes Intravenous Every 24 hours 02/16/20 1127 02/20/20 1140   01/10/20 0930  vancomycin (VANCOCIN) IVPB 1000 mg/200 mL premix  Status:  Discontinued     1,000 mg 200 mL/hr over 60 Minutes Intravenous  Once 01/10/20 0915 01/13/20 0623   01/06/20 2200  ceFEPIme (MAXIPIME) 2 g in sodium chloride 0.9 % 100 mL IVPB     2 g 200 mL/hr over 30 Minutes Intravenous Every 24 hours 01/05/20 2251 01/11/20 2234   01/05/20 2200  ceFEPIme (MAXIPIME) 2 g in sodium chloride 0.9 % 100 mL IVPB     2 g 200 mL/hr over  30 Minutes Intravenous  Once 01/05/20 2148 01/06/20 0015   01/05/20 2200  metroNIDAZOLE (FLAGYL) IVPB 500 mg     500 mg 100 mL/hr over 60 Minutes Intravenous  Once 01/05/20 2148 01/06/20 0015   01/05/20 2200  vancomycin (VANCOCIN) IVPB 1000 mg/200 mL premix  Status:  Discontinued     1,000 mg 200 mL/hr over 60 Minutes Intravenous  Once 01/05/20 2148 01/05/20 2151   01/05/20 2200  vancomycin (VANCOREADY) IVPB 2000 mg/400 mL  Status:  Discontinued     2,000 mg 200 mL/hr over 120 Minutes Intravenous  Once 01/05/20 2151 01/05/20 2223       Time spent: 25 minutes-Greater than 50% of this time was spent in counseling, explanation of diagnosis, planning of further  management, and coordination of care.  MEDICATIONS: Scheduled Meds: . acetaminophen (TYLENOL) oral liquid 160 mg/5 mL  1,000 mg Per Tube Q8H  . busPIRone  5 mg Per Tube BID  . calcium acetate  667 mg Oral TID WC  . camphor-menthol   Topical QID  . chlorhexidine gluconate (MEDLINE KIT)  15 mL Mouth Rinse BID  . Chlorhexidine Gluconate Cloth  6 each Topical Q0600  . darbepoetin (ARANESP) injection - DIALYSIS  200 mcg Subcutaneous Q Wed-1800  . feeding supplement (NEPRO CARB STEADY)  1,000 mL Per Tube Q24H  . feeding supplement (PRO-STAT SUGAR FREE 64)  30 mL Per Tube TID  . heparin injection (subcutaneous)  5,000 Units Subcutaneous Q8H  . insulin aspart  0-9 Units Subcutaneous Q4H  . insulin glargine  8 Units Subcutaneous QHS  . lactulose  20 g Per Tube Daily  . pantoprazole sodium  40 mg Per Tube BID  . PARoxetine  40 mg Per Tube Daily  . sodium chloride flush  10-40 mL Intracatheter Q12H   Continuous Infusions: . ferric gluconate (FERRLECIT/NULECIT) IV 125 mg (03/10/20 1002)   PRN Meds:.feeding supplement (NEPRO CARB STEADY), guaiFENesin, metoprolol tartrate, ondansetron (ZOFRAN) IV, phenol, promethazine, sodium chloride flush   PHYSICAL EXAM: Vital signs: Vitals:   03/10/20 2331 03/11/20 0410 03/11/20 0630 03/11/20 0732  BP: 136/62 (!) 152/67  (!) 153/81  Pulse: 84 80  83  Resp: _0 Temp: 98.4 F (36.9 C) 98.6 F (37 C)  98.4 F (36.9 C)  TempSrc: Oral Oral  Oral  SpO2: 100% 100%  100%  Weight:   89.9 kg   Height:       Filed Weights   03/10/20 0400 03/11/20 0630  Weight: 89.3 kg 89.9 kg   Body mass index is 34.02 kg/m.   Gen Exam:Alert awake-not in any distress HEENT:atraumatic, normocephalic Chest: B/L clear to auscultation anteriorly CVS:S1S2 regular Abdomen:soft non tender, non distended Extremities:no edema Neurology: Appears to have significant amount of debility/generalized weakness but seems to be moving all 4 extremities Skin: no rash  I  have personally reviewed following labs and imaging studies  LABORATORY DATA: CBC: Recent Labs  Lab 03/05/20 0339 03/08/20 0418 03/09/20 0152 03/10/20 0925  WBC 8.1 4.9 4.6 4.8  HGB 8.3* 8.1* 7.9* 8.4*  HCT 29.8* 29.2* 29.3* 31.0*  MCV 97.1 97.0 99.7 99.0  PLT 175 167 171 671    Basic Metabolic Panel: Recent Labs  Lab 03/06/20 0508 03/08/20 0418 03/09/20 0152 03/10/20 0805 03/11/20 0408  NA 136 136 134* 135 132*  K 3.4* 3.9 3.8 4.0 3.7  CL 98 100 94* 94* 98  CO2 _1 GLUCOSE 94 75 102* 119* 89  BUN 42*  78* 90* 62* 40*  CREATININE 1.28* 1.86* 2.14* 1.64* 1.33*  CALCIUM 8.3* 9.4 9.7 9.4 8.7*  PHOS 3.0 5.2* 5.5* 4.1 3.4    GFR: Estimated Creatinine Clearance: 42.1 mL/min (A) (by C-G formula based on SCr of 1.33 mg/dL (H)).  Liver Function Tests: Recent Labs  Lab 03/06/20 0508 03/08/20 0418 03/09/20 0152 03/10/20 0805 03/11/20 0408  ALBUMIN 2.2* 2.3* 2.3* 2.3* 2.5*   No results for input(s): LIPASE, AMYLASE in the last 168 hours. No results for input(s): AMMONIA in the last 168 hours.  Coagulation Profile: No results for input(s): INR, PROTIME in the last 168 hours.  Cardiac Enzymes: No results for input(s): CKTOTAL, CKMB, CKMBINDEX, TROPONINI in the last 168 hours.  BNP (last 3 results) No results for input(s): PROBNP in the last 8760 hours.  Lipid Profile: No results for input(s): CHOL, HDL, LDLCALC, TRIG, CHOLHDL, LDLDIRECT in the last 72 hours.  Thyroid Function Tests: No results for input(s): TSH, T4TOTAL, FREET4, T3FREE, THYROIDAB in the last 72 hours.  Anemia Panel: No results for input(s): VITAMINB12, FOLATE, FERRITIN, TIBC, IRON, RETICCTPCT in the last 72 hours.  Urine analysis:    Component Value Date/Time   COLORURINE YELLOW 02/25/2020 1600   APPEARANCEUR TURBID (A) 02/25/2020 1600   LABSPEC 1.021 02/25/2020 1600   PHURINE 5.0 02/25/2020 1600   GLUCOSEU NEGATIVE 02/25/2020 1600   HGBUR MODERATE (A) 02/25/2020 1600    BILIRUBINUR NEGATIVE 02/25/2020 1600   KETONESUR NEGATIVE 02/25/2020 1600   PROTEINUR 100 (A) 02/25/2020 1600   NITRITE NEGATIVE 02/25/2020 1600   LEUKOCYTESUR MODERATE (A) 02/25/2020 1600    Sepsis Labs: Lactic Acid, Venous    Component Value Date/Time   LATICACIDVEN 0.7 02/24/2020 1823    MICROBIOLOGY: Recent Results (from the past 240 hour(s))  MRSA PCR Screening     Status: Abnormal   Collection Time: 03/03/20 11:00 AM   Specimen: Nasopharyngeal  Result Value Ref Range Status   MRSA by PCR POSITIVE (A) NEGATIVE Final    Comment:        The GeneXpert MRSA Assay (FDA approved for NASAL specimens only), is one component of a comprehensive MRSA colonization surveillance program. It is not intended to diagnose MRSA infection nor to guide or monitor treatment for MRSA infections. RESULT CALLED TO, READ BACK BY AND VERIFIED WITH: Jefferson Fuel RN 14:35 03/03/20 ([email or web address]) Performed at [facility], [place] [address], [place], [place] [address]   SARS CORONAVIRUS 2 (TAT 6-24 HRS) Nasopharyngeal Nasopharyngeal Swab     Status: None   Collection Time: 03/08/20  4:16 PM   Specimen: Nasopharyngeal Swab  Result Value Ref Range Status   SARS Coronavirus 2 NEGATIVE NEGATIVE Final    Comment: (NOTE) SARS-CoV-2 target nucleic acids are NOT DETECTED. The SARS-CoV-2 RNA is generally detectable in upper and lower respiratory specimens during the acute phase of infection. Negative results do not preclude SARS-CoV-2 infection, do not rule out co-infections with other pathogens, and should not be used as the sole basis for treatment or other patient management decisions. Negative results must be combined with clinical observations, patient history, and epidemiological information. The expected result is Negative. Fact Sheet for Patients: SugarRoll.be Fact Sheet for Healthcare Providers: https://www.woods-mathews.com/ This test is not yet  approved or cleared by the [place] FDA and  has been authorized for detection and/or diagnosis of SARS-CoV-2 by FDA under an Emergency Use Authorization (EUA). This EUA will remain  in effect (meaning this test can be used) for the duration of the  COVID-19 declaration under Section 56 4(b)(1) of the Act, 21 U.S.C. section 360bbb-3(b)(1), unless the authorization is terminated or revoked sooner. Performed at [facility], [place] [address], [place], [place] [address]     RADIOLOGY STUDIES/RESULTS: No results found.   LOS: 72 days   Oren Binet, MD  [facility]    To contact the attending provider between 7A-7P or the covering provider during after hours 7P-7A, please log into the web site [email or web address] and access using universal [place] password for that web site. If you do not have the password, please call the hospital operator.  03/11/2020, 1:31 PM

## 2020-03-12 LAB — CBC
HCT: 32.3 % — ABNORMAL LOW (ref 36.0–46.0)
Hemoglobin: 9.1 g/dL — ABNORMAL LOW (ref 12.0–15.0)
MCH: 27.5 pg (ref 26.0–34.0)
MCHC: 28.2 g/dL — ABNORMAL LOW (ref 30.0–36.0)
MCV: 97.6 fL (ref 80.0–100.0)
Platelets: 174 10*3/uL (ref 150–400)
RBC: 3.31 MIL/uL — ABNORMAL LOW (ref 3.87–5.11)
RDW: 18.1 % — ABNORMAL HIGH (ref 11.5–15.5)
WBC: 5.1 10*3/uL (ref 4.0–10.5)
nRBC: 0 % (ref 0.0–0.2)

## 2020-03-12 LAB — RENAL FUNCTION PANEL
Albumin: 2.4 g/dL — ABNORMAL LOW (ref 3.5–5.0)
Anion gap: 11 (ref 5–15)
BUN: 58 mg/dL — ABNORMAL HIGH (ref 8–23)
CO2: 25 mmol/L (ref 22–32)
Calcium: 9.1 mg/dL (ref 8.9–10.3)
Chloride: 97 mmol/L — ABNORMAL LOW (ref 98–111)
Creatinine, Ser: 1.69 mg/dL — ABNORMAL HIGH (ref 0.44–1.00)
GFR calc Af Amer: 35 mL/min — ABNORMAL LOW (ref >60)
GFR calc non Af Amer: 30 mL/min — ABNORMAL LOW (ref >60)
Glucose, Bld: 111 mg/dL — ABNORMAL HIGH (ref 70–99)
Phosphorus: 4.3 mg/dL (ref 2.5–4.6)
Potassium: 3.6 mmol/L (ref 3.5–5.1)
Sodium: 133 mmol/L — ABNORMAL LOW (ref 135–145)

## 2020-03-12 LAB — GLUCOSE, CAPILLARY
Glucose-Capillary: 100 mg/dL — ABNORMAL HIGH (ref 70–99)
Glucose-Capillary: 106 mg/dL — ABNORMAL HIGH (ref 70–99)
Glucose-Capillary: 106 mg/dL — ABNORMAL HIGH (ref 70–99)
Glucose-Capillary: 100 mg/dL — ABNORMAL HIGH (ref 70–99)
Glucose-Capillary: 147 mg/dL — ABNORMAL HIGH (ref 70–99)

## 2020-03-12 MED ORDER — HEPARIN SODIUM (PORCINE) 1000 UNIT/ML IJ SOLN
INTRAMUSCULAR | Status: AC
  Filled 2020-03-12: qty 4

## 2020-03-12 NOTE — Progress Notes (Signed)
PROGRESS NOTE        PATIENT DETAILS Name: Bailey Cox Age: 74 y.o. Sex: female Date of Birth: 07-08-1946 Admit Date: 01/05/2020 Admitting Physician Kandis Cocking, MD BTD:[id number], No Pcp Per  Brief Narrative: 74 year old female with history of cholecystitis status post cholecystectomy in 9/20 at [facility]. She was discharged to SNF but rehospitalized to [facility] and had bile duct stent. Discharged back to SNF but readmitted with Ogilive syndrome. Hospital course complicated by fluid overload and transferred to [facility] for dialysis. At [facility], developed respiratory failure requiring vent support. She also suffered a cardiac arrest during EGD. She underwent trach,she was eventually discharged to [facility] on 3/1 after prolonged hospitalization at [facility] but deemed too sick, and sent to [facility] due to fluid overload and acidosis.  Patient was initially admitted to ICU. Required CRRT 3/9-3/12. Transfer to floor on 3/14. She pulled her trach. Now on oxygen by nasal cannula. She is on Intermitted HD via [place]. Has refused dialysis at times but deemed to lack capacity. Family wants full scope of care including full code and dialysis. She has been noncompliant with care and hemodialysis but lacks capacity. Ethics committee meeting held on 4/6. The plan is to continue full scope of care per family's wish. Finally shetolerated HD in recliner chair on 4/19 without sedating medications. She will be discharged to SNF once she had outpatient HD spot andcontinues to tolerate HD on recliner chair.  She's been started on lactulose for elevated ammonia with concern for hepatic encephalopathy as well as nightly bipap for acute hypercapneic resp failure with altered mental status.    Subjective: Lying comfortably in bed-no chest pain or shortness of breath.  Assessment/Plan: Acute metabolic encephalopathy: Multifactorial etiology-including AKI,  hypercarbia, hospital/ICU delirium, hyperammonemia, pneumonia.  Encephalopathy is improved-although waxing and waning at times.  TSH/vitamin B12 within normal limits-RPR nonreactive.  Continue supportive care.  Septic shock secondary to UTI and PNA: Sepsis physiology has resolved-has completed a course of antimicrobial therapy.  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  Continue BiPAP nightly-wean oxygen as able.  Avoid sedating medications like narcotics/benzodiazepines as much as possible.  Pseudomonas aeruginosa/aspiration pneumonia with right-sided parapneumonic effusion: Has completed multiple courses of antibiotics-see below.  Patient is s/p  thoracocentesis by PCCM on 4/22-plans are to continue with Rocephin until 5/1.  Maintain aspiration precautions.  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  Noncompliant with HD but lacks capacity.  DM-2 with hyperglycemia ( A1c 5.7%): No further hypoglycemic episodes-continue Lantus 8 units and SSI.    CBG (last 3)  Recent Labs    03/12/20 0418 03/12/20 0748 03/12/20 1540  GLUCAP 100* 106* 106*    Essential hypertension: BP controlled-not on scheduled antihypertensives-remains on as needed metoprolol.   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  Hemoglobin stable-follow periodically.   Hypokalemia: Repleted  Hyponatremia: Improved-follow.  Anxiety/depression/agitation: Stable this morning-calm-quiet and answered most of my questions appropriately.  Continue Paxil and BuSpar.  Maintain delirium precautions.  Due to lethargy/sedation-no longer on Haldol and Seroquel.  GERD: Continue PPI  Thrombocytopenia:  resolved.  Generalized weakness/debility: Secondary to acute illness-Per prior note--not able to participate in therapy.  Requiring mechanical lift to chair.  Chronic pain:-Scheduled Tylenol 1000 mg  3 times daily.  No longer onoxycodone and reduced IV fentanyl due to AMS.  Goals of care/Ethical issues-complex medical issue with guarded prognosis. Patient refused HD previously but deemed to lack capacity by psych. Daughter wishes her to be full code with full scope of care including dialysis.Continued on HD with the help of medications and family.Palliative care following.Ethics involved previously.   Nutrition Problem: Nutrition Problem: Inadequate oral intake Etiology: poor appetite, acute illness Signs/Symptoms: meal completion < 25% Interventions: Tube feeding, Nepro shake  Obesity: Estimated body mass index is 34.21 kg/m as calculated from the following:   Height as of this encounter: '5\' 4"'$  (1.626 m).   Weight as of this encounter: 90.4 kg.   DVT prophylaxis: SQ heparin  Diet: Diet Order            DIET DYS 2 Room service appropriate? No; Fluid consistency: Thin  Diet effective now               Code Status: Full code   Family Communication: Spoke to daughter on 5/5  Procedures : 3/5 IJ vas cath-> 4/22 right-sided thoracocentesis>>  Significant studies: Echo 10/24/2019 > LVEF 55%, Grade 1 DD. RV systolic function normal.  Echo 3/2: LVEF 65-70%, grade II dysfunction. RVSP  Consults: Nephrology PCCM Psychiatry Palliative care Interventional radiology Ethics  Disposition Plan:  SNF when ready for discharge-awaiting bed. Status is: Inpatient  Remains inpatient appropriate because:Inpatient level of care appropriate due to severity of illness   Dispo: The patient is from: [facility]              Anticipated d/c is to: SNF              Anticipated d/c date is: > 3 days              Patient currently is medically stable to d/c.  Awaiting SNF bed/transportation to HD.   Antimicrobial agents: Anti-infectives (From admission, onward)   Start     Dose/Rate Route Frequency  Ordered Stop   02/28/20 1500  cefTRIAXone (ROCEPHIN) 2 g in sodium chloride 0.9 % 100 mL IVPB     2 g 200 mL/hr over 30 Minutes Intravenous Every 24 hours 02/28/20 1404 03/05/20 2359   02/26/20 1300  cefTRIAXone (ROCEPHIN) 1 g in sodium chloride 0.9 % 100 mL IVPB  Status:  Discontinued     1 g 200 mL/hr over 30 Minutes Intravenous Daily 02/26/20 1201 02/27/20 0930   02/16/20 1230  cefTRIAXone (ROCEPHIN) 2 g in sodium chloride 0.9 % 100 mL IVPB     2 g 200 mL/hr over 30 Minutes Intravenous Every 24 hours 02/16/20 1127 02/20/20 0941   02/16/20 1230  azithromycin (ZITHROMAX) 500 mg in sodium chloride 0.9 % 250 mL IVPB     500 mg 250 mL/hr over 60 Minutes Intravenous Every 24 hours 02/16/20 1127 02/20/20 1140   01/10/20 0930  vancomycin (VANCOCIN) IVPB 1000 mg/200 mL premix  Status:  Discontinued     1,000 mg 200 mL/hr over 60 Minutes Intravenous  Once 01/10/20 0915 01/13/20 0623   01/06/20 2200  ceFEPIme (MAXIPIME) 2 g in sodium chloride 0.9 % 100 mL IVPB     2 g 200 mL/hr over 30 Minutes Intravenous Every 24 hours 01/05/20 2251 01/11/20 2234   01/05/20 2200  ceFEPIme (MAXIPIME) 2 g in sodium chloride 0.9 % 100 mL IVPB     2 g 200 mL/hr over 30 Minutes Intravenous  Once 01/05/20 2148 01/06/20 0015   01/05/20  2200  metroNIDAZOLE (FLAGYL) IVPB 500 mg     500 mg 100 mL/hr over 60 Minutes Intravenous  Once 01/05/20 2148 01/06/20 0015   01/05/20 2200  vancomycin (VANCOCIN) IVPB 1000 mg/200 mL premix  Status:  Discontinued     1,000 mg 200 mL/hr over 60 Minutes Intravenous  Once 01/05/20 2148 01/05/20 2151   01/05/20 2200  vancomycin (VANCOREADY) IVPB 2000 mg/400 mL  Status:  Discontinued     2,000 mg 200 mL/hr over 120 Minutes Intravenous  Once 01/05/20 2151 01/05/20 2223       Time spent: 25 minutes-Greater than 50% of this time was spent in counseling, explanation of diagnosis, planning of further management, and coordination of care.  MEDICATIONS: Scheduled Meds: . acetaminophen  (TYLENOL) oral liquid 160 mg/5 mL  1,000 mg Per Tube Q8H  . busPIRone  5 mg Per Tube BID  . calcium acetate  667 mg Oral TID WC  . camphor-menthol   Topical QID  . chlorhexidine gluconate (MEDLINE KIT)  15 mL Mouth Rinse BID  . Chlorhexidine Gluconate Cloth  6 each Topical Q0600  . darbepoetin (ARANESP) injection - DIALYSIS  200 mcg Subcutaneous Q Wed-1800  . feeding supplement (NEPRO CARB STEADY)  1,000 mL Per Tube Q24H  . feeding supplement (PRO-STAT SUGAR FREE 64)  30 mL Per Tube TID  . heparin injection (subcutaneous)  5,000 Units Subcutaneous Q8H  . insulin aspart  0-9 Units Subcutaneous Q4H  . insulin glargine  8 Units Subcutaneous QHS  . lactulose  20 g Per Tube Daily  . pantoprazole sodium  40 mg Per Tube BID  . PARoxetine  40 mg Per Tube Daily  . sodium chloride flush  10-40 mL Intracatheter Q12H   Continuous Infusions:  PRN Meds:.feeding supplement (NEPRO CARB STEADY), guaiFENesin, metoprolol tartrate, ondansetron (ZOFRAN) IV, phenol, promethazine, sodium chloride flush   PHYSICAL EXAM: Vital signs: Vitals:   03/12/20 1000 03/12/20 1030 03/12/20 1100 03/12/20 1120  BP: (!) 142/65 (!) 155/66 (!) 161/77 (!) 177/75  Pulse: 73 73 76 85  Resp: '18 19 18 19  '$ Temp:    98.4 F (36.9 C)  TempSrc:    Oral  SpO2:    100%  Weight:      Height:       Filed Weights   03/11/20 0630 03/12/20 0400  Weight: 89.9 kg 90.4 kg   Body mass index is 34.21 kg/m.   Gen Exam:Alert awake-not in any distress HEENT:atraumatic, normocephalic Chest: B/L clear to auscultation anteriorly CVS:S1S2 regular Abdomen:soft non tender, non distended Extremities:no edema Neurology: Appears to have significant amount of debility/generalized weakness but seems to be moving all 4 extremities Skin: no rash  I have personally reviewed following labs and imaging studies  LABORATORY DATA: CBC: Recent Labs  Lab 03/08/20 0418 03/09/20 0152 03/10/20 0925 03/12/20 0727  WBC 4.9 4.6 4.8 5.1  HGB  8.1* 7.9* 8.4* 9.1*  HCT 29.2* 29.3* 31.0* 32.3*  MCV 97.0 99.7 99.0 97.6  PLT 167 171 182 707    Basic Metabolic Panel: Recent Labs  Lab 03/08/20 0418 03/09/20 0152 03/10/20 0805 03/11/20 0408 03/12/20 0727  NA 136 134* 135 132* 133*  K 3.9 3.8 4.0 3.7 3.6  CL 100 94* 94* 98 97*  CO2 '26 26 28 28 25  '$ GLUCOSE 75 102* 119* 89 111*  BUN 78* 90* 62* 40* 58*  CREATININE 1.86* 2.14* 1.64* 1.33* 1.69*  CALCIUM 9.4 9.7 9.4 8.7* 9.1  PHOS 5.2* 5.5* 4.1 3.4 4.3  GFR: Estimated Creatinine Clearance: 33.3 mL/min (A) (by C-G formula based on SCr of 1.69 mg/dL (H)).  Liver Function Tests: Recent Labs  Lab 03/08/20 0418 03/09/20 0152 03/10/20 0805 03/11/20 0408 03/12/20 0727  ALBUMIN 2.3* 2.3* 2.3* 2.5* 2.4*   No results for input(s): LIPASE, AMYLASE in the last 168 hours. No results for input(s): AMMONIA in the last 168 hours.  Coagulation Profile: No results for input(s): INR, PROTIME in the last 168 hours.  Cardiac Enzymes: No results for input(s): CKTOTAL, CKMB, CKMBINDEX, TROPONINI in the last 168 hours.  BNP (last 3 results) No results for input(s): PROBNP in the last 8760 hours.  Lipid Profile: No results for input(s): CHOL, HDL, LDLCALC, TRIG, CHOLHDL, LDLDIRECT in the last 72 hours.  Thyroid Function Tests: No results for input(s): TSH, T4TOTAL, FREET4, T3FREE, THYROIDAB in the last 72 hours.  Anemia Panel: No results for input(s): VITAMINB12, FOLATE, FERRITIN, TIBC, IRON, RETICCTPCT in the last 72 hours.  Urine analysis:    Component Value Date/Time   COLORURINE YELLOW 02/25/2020 1600   APPEARANCEUR TURBID (A) 02/25/2020 1600   LABSPEC 1.021 02/25/2020 1600   PHURINE 5.0 02/25/2020 1600   GLUCOSEU NEGATIVE 02/25/2020 1600   HGBUR MODERATE (A) 02/25/2020 1600   BILIRUBINUR NEGATIVE 02/25/2020 1600   KETONESUR NEGATIVE 02/25/2020 1600   PROTEINUR 100 (A) 02/25/2020 1600   NITRITE NEGATIVE 02/25/2020 1600   LEUKOCYTESUR MODERATE (A) 02/25/2020 1600     Sepsis Labs: Lactic Acid, Venous    Component Value Date/Time   LATICACIDVEN 0.7 02/24/2020 1823    MICROBIOLOGY: Recent Results (from the past 240 hour(s))  MRSA PCR Screening     Status: Abnormal   Collection Time: 03/03/20 11:00 AM   Specimen: Nasopharyngeal  Result Value Ref Range Status   MRSA by PCR POSITIVE (A) NEGATIVE Final    Comment:        The GeneXpert MRSA Assay (FDA approved for NASAL specimens only), is one component of a comprehensive MRSA colonization surveillance program. It is not intended to diagnose MRSA infection nor to guide or monitor treatment for MRSA infections. RESULT CALLED TO, READ BACK BY AND VERIFIED WITH: Jefferson Fuel RN 14:35 03/03/20 ([email or web address]) Performed at [facility], [place] [address], [place], [place] [address]   SARS CORONAVIRUS 2 (TAT 6-24 HRS) Nasopharyngeal Nasopharyngeal Swab     Status: None   Collection Time: 03/08/20  4:16 PM   Specimen: Nasopharyngeal Swab  Result Value Ref Range Status   SARS Coronavirus 2 NEGATIVE NEGATIVE Final    Comment: (NOTE) SARS-CoV-2 target nucleic acids are NOT DETECTED. The SARS-CoV-2 RNA is generally detectable in upper and lower respiratory specimens during the acute phase of infection. Negative results do not preclude SARS-CoV-2 infection, do not rule out co-infections with other pathogens, and should not be used as the sole basis for treatment or other patient management decisions. Negative results must be combined with clinical observations, patient history, and epidemiological information. The expected result is Negative. Fact Sheet for Patients: SugarRoll.be Fact Sheet for Healthcare Providers: https://www.woods-mathews.com/ This test is not yet approved or cleared by the [place] FDA and  has been authorized for detection and/or diagnosis of SARS-CoV-2 by FDA under an Emergency Use Authorization (EUA). This EUA will remain  in effect  (meaning this test can be used) for the duration of the COVID-19 declaration under Section 56 4(b)(1) of the Act, 21 U.S.C. section 360bbb-3(b)(1), unless the authorization is terminated or revoked sooner. Performed at [facility], [address] [place], [place] [address]     RADIOLOGY STUDIES/RESULTS: No results found.   LOS: 55 days   Oren Binet, MD  [facility]    To contact the attending provider between 7A-7P or the covering provider during after hours 7P-7A, please log into the web site [email or web address] and access using universal [place] password for that web site. If you do not have the password, please call the hospital operator.  03/12/2020, 3:59 PM

## 2020-03-12 NOTE — Progress Notes (Signed)
Asked patient if she was still refusing Bipap for tonight, patient replied yes.  She is currently on [place] 2L with a sat of 100% and is resting.  Will continue to monitor.

## 2020-03-12 NOTE — Progress Notes (Signed)
OT Cancellation Note  Patient Details Name: Bailey Cox MRN: [id number] DOB: 1946/09/22   Cancelled Treatment:    Reason Eval/Treat Not Completed: Patient at procedure or test/ unavailable(HD). Will continue to follow.  [name] 03/12/2020, 11:03 AM  Nestor Lewandowsky, OTR/L Acute Rehabilitation Services Pager: [phone] Office: [phone]

## 2020-03-12 NOTE — Progress Notes (Signed)
Pt is in HD and will reattempt as time and pt allow.     03/12/20 0900  PT Visit Information  Last PT Received On 03/12/20  Reason Eval/Treat Not Completed Other (comment)   Mee Hives, PT MS Acute Rehab Dept. Number: [place]

## 2020-03-12 NOTE — Procedures (Signed)
I was present at this dialysis session. I have reviewed the session itself and made appropriate changes.   Vital signs in last 24 hours:  Temp:  [97.6 F (36.4 C)-98.4 F (36.9 C)] 98.4 F (36.9 C) (05/07 0815) Pulse Rate:  [75-86] 75 (05/07 0830) Resp:  [14-24] 16 (05/07 0830) BP: (152-173)/(62-80) 166/76 (05/07 0830) SpO2:  [99 %-100 %] 100 % (05/07 0815) Weight:  [90.4 kg] 90.4 kg (05/07 0400) Weight change: 0.5 kg Filed Weights   03/10/20 0400 03/11/20 0630 03/12/20 0400  Weight: 89.3 kg 89.9 kg 90.4 kg    Recent Labs  Lab 03/12/20 0727  NA 133*  K 3.6  CL 97*  CO2 25  GLUCOSE 111*  BUN 58*  CREATININE 1.69*  CALCIUM 9.1  PHOS 4.3    Recent Labs  Lab 03/09/20 0152 03/10/20 0925 03/12/20 0727  WBC 4.6 4.8 5.1  HGB 7.9* 8.4* 9.1*  HCT 29.3* 31.0* 32.3*  MCV 99.7 99.0 97.6  PLT 171 182 174    Scheduled Meds: . acetaminophen (TYLENOL) oral liquid 160 mg/5 mL  1,000 mg Per Tube Q8H  . busPIRone  5 mg Per Tube BID  . calcium acetate  667 mg Oral TID WC  . camphor-menthol   Topical QID  . chlorhexidine gluconate (MEDLINE KIT)  15 mL Mouth Rinse BID  . Chlorhexidine Gluconate Cloth  6 each Topical Q0600  . darbepoetin (ARANESP) injection - DIALYSIS  200 mcg Subcutaneous Q Wed-1800  . feeding supplement (NEPRO CARB STEADY)  1,000 mL Per Tube Q24H  . feeding supplement (PRO-STAT SUGAR FREE 64)  30 mL Per Tube TID  . heparin injection (subcutaneous)  5,000 Units Subcutaneous Q8H  . insulin aspart  0-9 Units Subcutaneous Q4H  . insulin glargine  8 Units Subcutaneous QHS  . lactulose  20 g Per Tube Daily  . pantoprazole sodium  40 mg Per Tube BID  . PARoxetine  40 mg Per Tube Daily  . sodium chloride flush  10-40 mL Intracatheter Q12H   Continuous Infusions: . ferric gluconate (FERRLECIT/NULECIT) IV 125 mg (03/10/20 1002)   PRN Meds:.feeding supplement (NEPRO CARB STEADY), guaiFENesin, metoprolol tartrate, ondansetron (ZOFRAN) IV, phenol, promethazine, sodium  chloride flush    Assessment and plan: 1. Awaiting discharge once transportation arrangements are made.  Donetta Potts,  MD 03/12/2020, 10:08 AM

## 2020-03-12 NOTE — Progress Notes (Signed)
Renal Navigator provided update to OP HD clinic/[facility] [place] and awaits update from [facility] team regarding discharge plan for patient.  Alphonzo Cruise, [place] Renal Navigator [phone]

## 2020-03-12 NOTE — TOC Progression Note (Signed)
Transition of Care [facility]) - Progression Note    Patient Details  Name: Bailey Cox MRN: [id number] Date of Birth: 01-13-1946  Transition of Care [facility]) CM/SW [place], LCSW Phone Number: 03/12/2020, 10:17 AM  Clinical Narrative:    CSW still has not heard back from [employer]. CSW left another voicemail for [place].    Expected Discharge Plan: Skilled Nursing Facility Barriers to Discharge: Continued Medical Work up, Other (comment)(ability to tolerate dialysis in recliner)  Expected Discharge Plan and Services Expected Discharge Plan: [place] In-house Referral: Clinical Social Work Discharge Planning Services: CM Consult Post Acute Care Choice: [place] Living arrangements for the past 2 months: (Has been hospitalized for over 4 months)                                       Social Determinants of Health (SDOH) Interventions    Readmission Risk Interventions Readmission Risk Prevention Plan 01/28/2020  Transportation Screening Complete  PCP or Specialist Appt within 3-5 Days Not Complete  Not Complete comments plan for SNF  HRI or [place] Not Complete  HRI or Home Care Consult comments plan for SNF  Social Work Consult for [place] Planning/Counseling Complete  Palliative Care Screening Complete  Medication Review Press photographer) Referral to Pharmacy  PCP or Specialist appointment within 3-5 days of discharge Not Complete  PCP/Specialist Appt Not Complete comments plan for SNF  HRI or Home Care Consult Complete  SW Recovery Care/Counseling Consult Complete  Palliative Care Screening Not Complete  Comments appropriate at this time  [place] Complete

## 2020-03-13 LAB — CBC
HCT: 32.3 % — ABNORMAL LOW (ref 36.0–46.0)
Hemoglobin: 8.9 g/dL — ABNORMAL LOW (ref 12.0–15.0)
MCH: 27 pg (ref 26.0–34.0)
MCHC: 27.6 g/dL — ABNORMAL LOW (ref 30.0–36.0)
MCV: 97.9 fL (ref 80.0–100.0)
Platelets: 169 10*3/uL (ref 150–400)
RBC: 3.3 MIL/uL — ABNORMAL LOW (ref 3.87–5.11)
RDW: 17.8 % — ABNORMAL HIGH (ref 11.5–15.5)
WBC: 5.5 10*3/uL (ref 4.0–10.5)
nRBC: 0 % (ref 0.0–0.2)

## 2020-03-13 LAB — COMPREHENSIVE METABOLIC PANEL
ALT: 19 U/L (ref 0–44)
AST: 30 U/L (ref 15–41)
Albumin: 2.5 g/dL — ABNORMAL LOW (ref 3.5–5.0)
Alkaline Phosphatase: 201 U/L — ABNORMAL HIGH (ref 38–126)
Anion gap: 15 (ref 5–15)
BUN: 36 mg/dL — ABNORMAL HIGH (ref 8–23)
CO2: 27 mmol/L (ref 22–32)
Calcium: 9.2 mg/dL (ref 8.9–10.3)
Chloride: 91 mmol/L — ABNORMAL LOW (ref 98–111)
Creatinine, Ser: 1.36 mg/dL — ABNORMAL HIGH (ref 0.44–1.00)
GFR calc Af Amer: 45 mL/min — ABNORMAL LOW (ref >60)
GFR calc non Af Amer: 39 mL/min — ABNORMAL LOW (ref >60)
Glucose, Bld: 127 mg/dL — ABNORMAL HIGH (ref 70–99)
Potassium: 3.5 mmol/L (ref 3.5–5.1)
Sodium: 133 mmol/L — ABNORMAL LOW (ref 135–145)
Total Bilirubin: 0.1 mg/dL — ABNORMAL LOW (ref 0.3–1.2)
Total Protein: 6.4 g/dL — ABNORMAL LOW (ref 6.5–8.1)

## 2020-03-13 LAB — GLUCOSE, CAPILLARY
Glucose-Capillary: 101 mg/dL — ABNORMAL HIGH (ref 70–99)
Glucose-Capillary: 113 mg/dL — ABNORMAL HIGH (ref 70–99)
Glucose-Capillary: 114 mg/dL — ABNORMAL HIGH (ref 70–99)
Glucose-Capillary: 119 mg/dL — ABNORMAL HIGH (ref 70–99)
Glucose-Capillary: 147 mg/dL — ABNORMAL HIGH (ref 70–99)
Glucose-Capillary: 84 mg/dL (ref 70–99)

## 2020-03-13 LAB — AMMONIA: Ammonia: 55 umol/L — ABNORMAL HIGH (ref 9–35)

## 2020-03-13 MED ORDER — LACTULOSE 10 GM/15ML PO SOLN
20.0000 g | Freq: Two times a day (BID) | ORAL | Status: DC
  Administered 2020-03-13 – 2020-03-17 (×9): 20 g
  Filled 2020-03-13 (×9): qty 30

## 2020-03-13 NOTE — Progress Notes (Signed)
Patient ID: Bailey Cox, female   DOB: 12-Mar-1946, 74 y.o.   MRN: [id number] S:Pt refused PT today.   O:BP (!) 148/58 (BP Location: Left Arm)   Pulse 80   Temp 98 F (36.7 C) (Axillary)   Resp 20   Ht '5\' 4"'$  (1.626 m)   Wt 86.9 kg   LMP  (LMP Unknown)   SpO2 99%   BMI 32.88 kg/m   Intake/Output Summary (Last 24 hours) at 03/13/2020 1140 Last data filed at 03/13/2020 0912 Gross per 24 hour  Intake 1936 ml  Output --  Net 1936 ml   Intake/Output: I/O last 3 completed shifts: In: 1936 [NG/GT:1936] Out: 2600 [Urine:600; Other:2000]  Intake/Output this shift:  No intake/output data recorded. Weight change: -3.5 kg Gen: NAD CVS: no rub Resp: cta Abd: +BS< soft, NT Ext: trace edema  Recent Labs  Lab 03/08/20 0418 03/09/20 0152 03/10/20 0805 03/11/20 0408 03/12/20 0727  NA 136 134* 135 132* 133*  K 3.9 3.8 4.0 3.7 3.6  CL 100 94* 94* 98 97*  CO2 '26 26 28 28 25  '$ GLUCOSE 75 102* 119* 89 111*  BUN 78* 90* 62* 40* 58*  CREATININE 1.86* 2.14* 1.64* 1.33* 1.69*  ALBUMIN 2.3* 2.3* 2.3* 2.5* 2.4*  CALCIUM 9.4 9.7 9.4 8.7* 9.1  PHOS 5.2* 5.5* 4.1 3.4 4.3   Liver Function Tests: Recent Labs  Lab 03/10/20 0805 03/11/20 0408 03/12/20 0727  ALBUMIN 2.3* 2.5* 2.4*   No results for input(s): LIPASE, AMYLASE in the last 168 hours. No results for input(s): AMMONIA in the last 168 hours. CBC: Recent Labs  Lab 03/08/20 0418 03/08/20 0418 03/09/20 0152 03/10/20 0925 03/12/20 0727  WBC 4.9   < > 4.6 4.8 5.1  HGB 8.1*   < > 7.9* 8.4* 9.1*  HCT 29.2*   < > 29.3* 31.0* 32.3*  MCV 97.0  --  99.7 99.0 97.6  PLT 167   < > 171 182 174   < > = values in this interval not displayed.   Cardiac Enzymes: No results for input(s): CKTOTAL, CKMB, CKMBINDEX, TROPONINI in the last 168 hours. CBG: Recent Labs  Lab 03/12/20 1540 03/12/20 1926 03/12/20 2313 03/13/20 0400 03/13/20 0745  GLUCAP 106* 100* 147* 101* 119*    Iron Studies: No results for input(s): IRON, TIBC,  TRANSFERRIN, FERRITIN in the last 72 hours. Studies/Results: No results found. [place] Kitchen acetaminophen (TYLENOL) oral liquid 160 mg/5 mL  1,000 mg Per Tube Q8H  . busPIRone  5 mg Per Tube BID  . calcium acetate  667 mg Oral TID WC  . camphor-menthol   Topical QID  . chlorhexidine gluconate (MEDLINE KIT)  15 mL Mouth Rinse BID  . Chlorhexidine Gluconate Cloth  6 each Topical Q0600  . darbepoetin (ARANESP) injection - DIALYSIS  200 mcg Subcutaneous Q Wed-1800  . feeding supplement (NEPRO CARB STEADY)  1,000 mL Per Tube Q24H  . feeding supplement (PRO-STAT SUGAR FREE 64)  30 mL Per Tube TID  . heparin injection (subcutaneous)  5,000 Units Subcutaneous Q8H  . insulin aspart  0-9 Units Subcutaneous Q4H  . insulin glargine  8 Units Subcutaneous QHS  . lactulose  20 g Per Tube Daily  . pantoprazole sodium  40 mg Per Tube BID  . PARoxetine  40 mg Per Tube Daily  . sodium chloride flush  10-40 mL Intracatheter Q12H    BMET    Component Value Date/Time   NA 133 (L) 03/12/2020 0727   K 3.6 03/12/2020 6834  CL 97 (L) 03/12/2020 0727   CO2 25 03/12/2020 0727   GLUCOSE 111 (H) 03/12/2020 0727   BUN 58 (H) 03/12/2020 0727   CREATININE 1.69 (H) 03/12/2020 0727   CALCIUM 9.1 03/12/2020 0727   GFRNONAA 30 (L) 03/12/2020 0727   GFRAA 35 (L) 03/12/2020 0727   CBC    Component Value Date/Time   WBC 5.1 03/12/2020 0727   RBC 3.31 (L) 03/12/2020 0727   HGB 9.1 (L) 03/12/2020 0727   HCT 32.3 (L) 03/12/2020 0727   PLT 174 03/12/2020 0727   MCV 97.6 03/12/2020 0727   MCH 27.5 03/12/2020 0727   MCHC 28.2 (L) 03/12/2020 0727   RDW 18.1 (H) 03/12/2020 0727   LYMPHSABS 0.8 03/03/2020 0515   MONOABS 0.8 03/03/2020 0515   EOSABS 0.1 03/03/2020 0515   BASOSABS 0.1 03/03/2020 0515     Assessment/Plan:  1. New ESRD in setting of OliguricAKI/CKD stage 3- due to ischemic ATN in setting of sepsis. Unfortunately she has remained dialysis dependent since 01/09/20 and will require ongoing IHD. Now on MWF  schedule for outpateint HD at [facility], [place].  1. Will need to follow for renal recovery as an outpatient 2. Using TDC 3. Remains oliguric with rapidly rising BUN. Scr rising but less than 2 likely due to lack of muscle mass and malnutrition. 4. Continue with MWF schedule. 2. Debility- continue with PT/OT at SNF 3. Anemia of CKD 4. Sepsis- due to pseudomonas aspiration pneumonia. off pressors and resolved. Off vanco 5. H/o PEA arrest 6. HFpEF- mild volume overload. Will UF as tolerated  7. DM- per primary 8. 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 Was on trach but decannulated on 01/23/20. Continue with BiPAP nightly. 9. Disposition- for discharge to SNF and arranged for outpatient HD in [place], [place], awaiting transportation issues to be resolved.  Donetta Potts, MD [employer] [phone]

## 2020-03-13 NOTE — Progress Notes (Signed)
PT Cancellation Note  Patient Details Name: [name] MRN: [id number] DOB: December 22, 1945   Cancelled Treatment:    Reason Eval/Treat Not Completed: Patient declined, no reason specified. Pt verbally refusing therapy and physically resisting all assisted movement. Not appropriate for PT/OT at this time with this level of resistance. Therapies signing off.   Leighton Roach, PT  Acute Rehab Services  Pager [phone] Office [place] 03/13/2020, 10:43 AM

## 2020-03-13 NOTE — Progress Notes (Signed)
PROGRESS NOTE        PATIENT DETAILS Name: Bailey Cox Age: 74 y.o. Sex: female Date of Birth: February 24, 1946 Admit Date: 01/05/2020 Admitting Physician Kandis Cocking, MD [id number], No Pcp Per  Brief Narrative: 74 year old female with history of cholecystitis status post cholecystectomy in 9/20 at [facility]. She was discharged to SNF but rehospitalized to [facility] and had bile duct stent. Discharged back to SNF but readmitted with Ogilive syndrome. Hospital course complicated by fluid overload and transferred to [facility] for dialysis. At [facility], developed respiratory failure requiring vent support. She also suffered a cardiac arrest during EGD. She underwent trach,she was eventually discharged to [facility] on 3/1 after prolonged hospitalization at [facility] but deemed too sick, and sent to [facility] due to fluid overload and acidosis.  Patient was initially admitted to ICU. Required CRRT 3/9-3/12. Transfer to floor on 3/14. She pulled her trach. Now on oxygen by nasal cannula. She is on Intermitted HD via [place]. Has refused dialysis at times but deemed to lack capacity. Family wants full scope of care including full code and dialysis. She has been noncompliant with care and hemodialysis but lacks capacity. Ethics committee meeting held on 4/6. The plan is to continue full scope of care per family's wish. Finally shetolerated HD in recliner chair on 4/19 without sedating medications. She will be discharged to SNF once she had outpatient HD spot andcontinues to tolerate HD on recliner chair.  She's been started on lactulose for elevated ammonia with concern for hepatic encephalopathy as well as nightly bipap for acute hypercapneic resp failure with altered mental status.    Subjective: Lying comfortably in bed-no chest pain or shortness of breath.  Assessment/Plan: Acute metabolic encephalopathy: Multifactorial etiology-including AKI,  hypercarbia, hospital/ICU delirium, hyperammonemia, pneumonia.  Encephalopathy is improved-although waxing and waning at times.  TSH/vitamin B12 within normal limits-RPR nonreactive.  Continue supportive care.  Septic shock secondary to UTI and PNA: Sepsis physiology has resolved-has completed a course of antimicrobial therapy.  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  Continue BiPAP nightly-wean oxygen as able.  Avoid sedating medications like narcotics/benzodiazepines as much as possible.  Pseudomonas aeruginosa/aspiration pneumonia with right-sided parapneumonic effusion: Has completed multiple courses of antibiotics-see below.  Patient is s/p  thoracocentesis by PCCM on 4/22-plans are to continue with Rocephin until 5/1.  Maintain aspiration precautions.  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  Noncompliant with HD but lacks capacity.  DM-2 with hyperglycemia ( A1c 5.7%): No further hypoglycemic episodes-continue Lantus 8 units and SSI.    CBG (last 3)  Recent Labs    03/13/20 0745 03/13/20 1142 03/13/20 1553  GLUCAP 119* 113* 147*    Essential hypertension: BP controlled-not on scheduled antihypertensives-remains on as needed metoprolol.   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  Hemoglobin stable-follow periodically.   Hypokalemia: Repleted  Hyponatremia: Improved-follow.  Anxiety/depression/agitation: Stable this morning-calm-quiet and answered most of my questions appropriately.  Continue Paxil and BuSpar.  Maintain delirium precautions.  Due to lethargy/sedation-no longer on Haldol and Seroquel.  GERD: Continue PPI  Thrombocytopenia:  resolved.  Generalized weakness/debility: Secondary to acute illness-Per prior note--not able to participate in therapy.  Requiring mechanical lift to chair.  Chronic pain:-Scheduled Tylenol 1000 mg  3 times daily.  No longer onoxycodone and reduced IV fentanyl due to AMS.  Goals of care/Ethical issues-complex medical issue with guarded prognosis. Patient refused HD previously but deemed to lack capacity by psych. Daughter wishes her to be full code with full scope of care including dialysis.Continued on HD with the help of medications and family.Palliative care following.Ethics involved previously.   Nutrition Problem: Nutrition Problem: Inadequate oral intake Etiology: poor appetite, acute illness Signs/Symptoms: meal completion < 25% Interventions: Tube feeding, Nepro shake  Obesity: Estimated body mass index is 32.88 kg/m as calculated from the following:   Height as of this encounter: '5\' 4"'$  (1.626 m).   Weight as of this encounter: 86.9 kg.   DVT prophylaxis: SQ heparin  Diet: Diet Order            DIET DYS 2 Room service appropriate? No; Fluid consistency: Thin  Diet effective now               Code Status: Full code   Family Communication: Spoke to daughter on 5/8  Procedures : 3/5 IJ vas cath-> 4/22 right-sided thoracocentesis>>  Significant studies: Echo 10/24/2019 > LVEF 55%, Grade 1 DD. RV systolic function normal.  Echo 3/2: LVEF 65-70%, grade II dysfunction. RVSP  Consults: Nephrology PCCM Psychiatry Palliative care Interventional radiology Ethics  Disposition Plan:  SNF when ready for discharge-awaiting bed. Status is: Inpatient  Remains inpatient appropriate because:Inpatient level of care appropriate due to severity of illness   Dispo: The patient is from: [facility]              Anticipated d/c is to: SNF              Anticipated d/c date is: > 3 days              Patient currently is medically stable to d/c.  Has SNF bed in [place]-but transportation from SNF to HD is still the main barrier for discharge   Antimicrobial  agents: Anti-infectives (From admission, onward)   Start     Dose/Rate Route Frequency Ordered Stop   02/28/20 1500  cefTRIAXone (ROCEPHIN) 2 g in sodium chloride 0.9 % 100 mL IVPB     2 g 200 mL/hr over 30 Minutes Intravenous Every 24 hours 02/28/20 1404 03/05/20 2359   02/26/20 1300  cefTRIAXone (ROCEPHIN) 1 g in sodium chloride 0.9 % 100 mL IVPB  Status:  Discontinued     1 g 200 mL/hr over 30 Minutes Intravenous Daily 02/26/20 1201 02/27/20 0930   02/16/20 1230  cefTRIAXone (ROCEPHIN) 2 g in sodium chloride 0.9 % 100 mL IVPB     2 g 200 mL/hr over 30 Minutes Intravenous Every 24 hours 02/16/20 1127 02/20/20 0941   02/16/20 1230  azithromycin (ZITHROMAX) 500 mg in sodium chloride 0.9 % 250 mL IVPB     500 mg 250 mL/hr over 60 Minutes Intravenous Every 24 hours 02/16/20 1127 02/20/20 1140   01/10/20 0930  vancomycin (VANCOCIN) IVPB 1000 mg/200 mL premix  Status:  Discontinued     1,000 mg 200 mL/hr over 60 Minutes Intravenous  Once 01/10/20 0915 01/13/20 0623   01/06/20 2200  ceFEPIme (MAXIPIME) 2 g in sodium chloride 0.9 % 100 mL IVPB     2 g 200 mL/hr over 30 Minutes Intravenous Every 24 hours 01/05/20 2251 01/11/20 2234   01/05/20 2200  ceFEPIme (MAXIPIME) 2 g in sodium chloride 0.9 % 100 mL IVPB     2 g 200 mL/hr over  30 Minutes Intravenous  Once 01/05/20 2148 01/06/20 0015   01/05/20 2200  metroNIDAZOLE (FLAGYL) IVPB 500 mg     500 mg 100 mL/hr over 60 Minutes Intravenous  Once 01/05/20 2148 01/06/20 0015   01/05/20 2200  vancomycin (VANCOCIN) IVPB 1000 mg/200 mL premix  Status:  Discontinued     1,000 mg 200 mL/hr over 60 Minutes Intravenous  Once 01/05/20 2148 01/05/20 2151   01/05/20 2200  vancomycin (VANCOREADY) IVPB 2000 mg/400 mL  Status:  Discontinued     2,000 mg 200 mL/hr over 120 Minutes Intravenous  Once 01/05/20 2151 01/05/20 2223       Time spent: 25 minutes-Greater than 50% of this time was spent in counseling, explanation of diagnosis, planning of further  management, and coordination of care.  MEDICATIONS: Scheduled Meds: . acetaminophen (TYLENOL) oral liquid 160 mg/5 mL  1,000 mg Per Tube Q8H  . busPIRone  5 mg Per Tube BID  . calcium acetate  667 mg Oral TID WC  . camphor-menthol   Topical QID  . chlorhexidine gluconate (MEDLINE KIT)  15 mL Mouth Rinse BID  . Chlorhexidine Gluconate Cloth  6 each Topical Q0600  . darbepoetin (ARANESP) injection - DIALYSIS  200 mcg Subcutaneous Q Wed-1800  . feeding supplement (NEPRO CARB STEADY)  1,000 mL Per Tube Q24H  . feeding supplement (PRO-STAT SUGAR FREE 64)  30 mL Per Tube TID  . heparin injection (subcutaneous)  5,000 Units Subcutaneous Q8H  . insulin aspart  0-9 Units Subcutaneous Q4H  . insulin glargine  8 Units Subcutaneous QHS  . lactulose  20 g Per Tube BID  . pantoprazole sodium  40 mg Per Tube BID  . PARoxetine  40 mg Per Tube Daily  . sodium chloride flush  10-40 mL Intracatheter Q12H   Continuous Infusions:  PRN Meds:.feeding supplement (NEPRO CARB STEADY), guaiFENesin, metoprolol tartrate, ondansetron (ZOFRAN) IV, phenol, promethazine, sodium chloride flush   PHYSICAL EXAM: Vital signs: Vitals:   03/13/20 0355 03/13/20 0800 03/13/20 1105 03/13/20 1200  BP: (!) 148/58     Pulse: 80  80   Resp: 18  20   Temp: 98.7 F (37.1 C) 98 F (36.7 C)  98.2 F (36.8 C)  TempSrc: Axillary Axillary  Axillary  SpO2: 99%  99%   Weight: 86.9 kg     Height:       Filed Weights   03/12/20 0400 03/13/20 0355  Weight: 90.4 kg 86.9 kg   Body mass index is 32.88 kg/m.   Gen Exam:Alert awake-not in any distress HEENT:atraumatic, normocephalic Chest: B/L clear to auscultation anteriorly CVS:S1S2 regular Abdomen:soft non tender, non distended Extremities:no edema Neurology: Appears to have significant amount of debility/generalized weakness but seems to be moving all 4 extremities Skin: no rash  I have personally reviewed following labs and imaging studies  LABORATORY  DATA: CBC: Recent Labs  Lab 03/08/20 0418 03/09/20 0152 03/10/20 0925 03/12/20 0727 03/13/20 1235  WBC 4.9 4.6 4.8 5.1 5.5  HGB 8.1* 7.9* 8.4* 9.1* 8.9*  HCT 29.2* 29.3* 31.0* 32.3* 32.3*  MCV 97.0 99.7 99.0 97.6 97.9  PLT 167 171 182 174 275    Basic Metabolic Panel: Recent Labs  Lab 03/08/20 0418 03/08/20 0418 03/09/20 0152 03/10/20 0805 03/11/20 0408 03/12/20 0727 03/13/20 1235  NA 136   < > 134* 135 132* 133* 133*  K 3.9   < > 3.8 4.0 3.7 3.6 3.5  CL 100   < > 94* 94* 98 97* 91*  CO2  26   < > '26 28 28 25 27  '$ GLUCOSE 75   < > 102* 119* 89 111* 127*  BUN 78*   < > 90* 62* 40* 58* 36*  CREATININE 1.86*   < > 2.14* 1.64* 1.33* 1.69* 1.36*  CALCIUM 9.4   < > 9.7 9.4 8.7* 9.1 9.2  PHOS 5.2*  --  5.5* 4.1 3.4 4.3  --    < > = values in this interval not displayed.    GFR: Estimated Creatinine Clearance: 40.5 mL/min (A) (by C-G formula based on SCr of 1.36 mg/dL (H)).  Liver Function Tests: Recent Labs  Lab 03/09/20 0152 03/10/20 0805 03/11/20 0408 03/12/20 0727 03/13/20 1235  AST  --   --   --   --  30  ALT  --   --   --   --  19  ALKPHOS  --   --   --   --  201*  BILITOT  --   --   --   --  0.1*  PROT  --   --   --   --  6.4*  ALBUMIN 2.3* 2.3* 2.5* 2.4* 2.5*   No results for input(s): LIPASE, AMYLASE in the last 168 hours. Recent Labs  Lab 03/13/20 1235  AMMONIA 55*    Coagulation Profile: No results for input(s): INR, PROTIME in the last 168 hours.  Cardiac Enzymes: No results for input(s): CKTOTAL, CKMB, CKMBINDEX, TROPONINI in the last 168 hours.  BNP (last 3 results) No results for input(s): PROBNP in the last 8760 hours.  Lipid Profile: No results for input(s): CHOL, HDL, LDLCALC, TRIG, CHOLHDL, LDLDIRECT in the last 72 hours.  Thyroid Function Tests: No results for input(s): TSH, T4TOTAL, FREET4, T3FREE, THYROIDAB in the last 72 hours.  Anemia Panel: No results for input(s): VITAMINB12, FOLATE, FERRITIN, TIBC, IRON, RETICCTPCT in the  last 72 hours.  Urine analysis:    Component Value Date/Time   COLORURINE YELLOW 02/25/2020 1600   APPEARANCEUR TURBID (A) 02/25/2020 1600   LABSPEC 1.021 02/25/2020 1600   PHURINE 5.0 02/25/2020 1600   GLUCOSEU NEGATIVE 02/25/2020 1600   HGBUR MODERATE (A) 02/25/2020 1600   BILIRUBINUR NEGATIVE 02/25/2020 1600   KETONESUR NEGATIVE 02/25/2020 1600   PROTEINUR 100 (A) 02/25/2020 1600   NITRITE NEGATIVE 02/25/2020 1600   LEUKOCYTESUR MODERATE (A) 02/25/2020 1600    Sepsis Labs: Lactic Acid, Venous    Component Value Date/Time   LATICACIDVEN 0.7 02/24/2020 1823    MICROBIOLOGY: Recent Results (from the past 240 hour(s))  SARS CORONAVIRUS 2 (TAT 6-24 HRS) Nasopharyngeal Nasopharyngeal Swab     Status: None   Collection Time: 03/08/20  4:16 PM   Specimen: Nasopharyngeal Swab  Result Value Ref Range Status   SARS Coronavirus 2 NEGATIVE NEGATIVE Final    Comment: (NOTE) SARS-CoV-2 target nucleic acids are NOT DETECTED. The SARS-CoV-2 RNA is generally detectable in upper and lower respiratory specimens during the acute phase of infection. Negative results do not preclude SARS-CoV-2 infection, do not rule out co-infections with other pathogens, and should not be used as the sole basis for treatment or other patient management decisions. Negative results must be combined with clinical observations, patient history, and epidemiological information. The expected result is Negative. Fact Sheet for Patients: SugarRoll.be Fact Sheet for Healthcare Providers: https://www.woods-mathews.com/ This test is not yet approved or cleared by the [place] FDA and  has been authorized for detection and/or diagnosis of SARS-CoV-2 by FDA under an Emergency Use Authorization (  EUA). This EUA will remain  in effect (meaning this test can be used) for the duration of the COVID-19 declaration under Section 56 4(b)(1) of the Act, 21 U.S.C. section  360bbb-3(b)(1), unless the authorization is terminated or revoked sooner. Performed at [facility], [place] [address] [place], [place] [address]     RADIOLOGY STUDIES/RESULTS: No results found.   LOS: 69 days   Oren Binet, MD  [facility]    To contact the attending provider between 7A-7P or the covering provider during after hours 7P-7A, please log into the web site [email or web address] and access using universal [place] password for that web site. If you do not have the password, please call the hospital operator.  03/13/2020, 4:19 PM

## 2020-03-13 NOTE — Progress Notes (Signed)
OT Cancellation Note  Patient Details Name: Bailey Cox MRN: [id number] DOB: 1946-02-20   Cancelled Treatment:    Reason Eval/Treat Not Completed: Patient declined, no reason specified;Other (comment); pt verbally refusing OT/PT eval and resisting any attempts at assisted movement at bed level. Given pt with notable history of refusals with therapies and pt continuing to refuse will sign off at this time. Please re-consult should pt's needs change.   Lou Cal, OT Acute Rehabilitation Services Pager [phone] Office [phone]   Raymondo Band 03/13/2020, 10:56 AM

## 2020-03-14 LAB — CBC
HCT: 33.9 % — ABNORMAL LOW (ref 36.0–46.0)
Hemoglobin: 9.5 g/dL — ABNORMAL LOW (ref 12.0–15.0)
MCH: 27.8 pg (ref 26.0–34.0)
MCHC: 28 g/dL — ABNORMAL LOW (ref 30.0–36.0)
MCV: 99.1 fL (ref 80.0–100.0)
Platelets: 172 10*3/uL (ref 150–400)
RBC: 3.42 MIL/uL — ABNORMAL LOW (ref 3.87–5.11)
RDW: 17.5 % — ABNORMAL HIGH (ref 11.5–15.5)
WBC: 6 10*3/uL (ref 4.0–10.5)
nRBC: 0 % (ref 0.0–0.2)

## 2020-03-14 LAB — RENAL FUNCTION PANEL
Albumin: 2.5 g/dL — ABNORMAL LOW (ref 3.5–5.0)
Anion gap: 13 (ref 5–15)
BUN: 51 mg/dL — ABNORMAL HIGH (ref 8–23)
CO2: 27 mmol/L (ref 22–32)
Calcium: 9.7 mg/dL (ref 8.9–10.3)
Chloride: 94 mmol/L — ABNORMAL LOW (ref 98–111)
Creatinine, Ser: 1.6 mg/dL — ABNORMAL HIGH (ref 0.44–1.00)
GFR calc Af Amer: 37 mL/min — ABNORMAL LOW (ref >60)
GFR calc non Af Amer: 32 mL/min — ABNORMAL LOW (ref >60)
Glucose, Bld: 129 mg/dL — ABNORMAL HIGH (ref 70–99)
Phosphorus: 4.7 mg/dL — ABNORMAL HIGH (ref 2.5–4.6)
Potassium: 3.8 mmol/L (ref 3.5–5.1)
Sodium: 134 mmol/L — ABNORMAL LOW (ref 135–145)

## 2020-03-14 LAB — GLUCOSE, CAPILLARY
Glucose-Capillary: 101 mg/dL — ABNORMAL HIGH (ref 70–99)
Glucose-Capillary: 119 mg/dL — ABNORMAL HIGH (ref 70–99)
Glucose-Capillary: 122 mg/dL — ABNORMAL HIGH (ref 70–99)
Glucose-Capillary: 126 mg/dL — ABNORMAL HIGH (ref 70–99)
Glucose-Capillary: 170 mg/dL — ABNORMAL HIGH (ref 70–99)
Glucose-Capillary: 300 mg/dL — ABNORMAL HIGH (ref 70–99)

## 2020-03-14 LAB — MAGNESIUM: Magnesium: 2.4 mg/dL (ref 1.7–2.4)

## 2020-03-14 LAB — AMMONIA: Ammonia: 43 umol/L — ABNORMAL HIGH (ref 9–35)

## 2020-03-14 NOTE — Progress Notes (Signed)
PROGRESS NOTE        PATIENT DETAILS Name: Bailey Cox Age: 74 y.o. Sex: female Date of Birth: 05-05-1946 Admit Date: 01/05/2020 Admitting Physician Kandis Cocking, MD JGO:[id number], No Pcp Per  Brief Narrative: 74 year old female with history of cholecystitis status post cholecystectomy in 9/20 at [facility]. She was discharged to SNF but rehospitalized to [facility] and had bile duct stent. Discharged back to SNF but readmitted with Ogilive syndrome. Hospital course complicated by fluid overload and transferred to [facility] for dialysis. At [facility], developed respiratory failure requiring vent support. She also suffered a cardiac arrest during EGD. She underwent trach,she was eventually discharged to [facility] on 3/1 after prolonged hospitalization at [facility] but deemed too sick, and sent to [facility] due to fluid overload and acidosis.  Patient was initially admitted to ICU. Required CRRT 3/9-3/12. Transfer to floor on 3/14. She pulled her trach. Now on oxygen by nasal cannula. She is on Intermitted HD via [place]. Has refused dialysis at times but deemed to lack capacity. Family wants full scope of care including full code and dialysis. She has been noncompliant with care and hemodialysis but lacks capacity. Ethics committee meeting held on 4/6. The plan is to continue full scope of care per family's wish. Finally shetolerated HD in recliner chair on 4/19 without sedating medications. She will be discharged to SNF once she had outpatient HD spot andcontinues to tolerate HD on recliner chair.  She's been started on lactulose for elevated ammonia with concern for hepatic encephalopathy as well as nightly bipap for acute hypercapneic resp failure with altered mental status.    Subjective:  Patient in bed in no distress, will not answer questions at will.  Assessment/Plan:  Acute metabolic encephalopathy: Multifactorial etiology-including AKI,  hypercarbia, hospital/ICU delirium, hyperammonemia, pneumonia.  Encephalopathy is improved-although waxing and waning at times.  TSH/vitamin B12 within normal limits-RPR nonreactive.  Continue supportive care.  Septic shock secondary to UTI and PNA: Sepsis physiology has resolved-has completed a course of antimicrobial therapy.  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  Continue BiPAP nightly - wean oxygen as able.  Avoid sedating medications like narcotics/benzodiazepines as much as possible.  Pseudomonas aeruginosa/aspiration pneumonia with right-sided parapneumonic effusion: Has completed multiple courses of antibiotics-see below.  Patient is s/p  thoracocentesis by PCCM on 4/22-plans are to continue with Rocephin until 5/1.  Maintain aspiration precautions.  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  Noncompliant with HD but lacks capacity.  Essential hypertension: BP controlled-not on scheduled antihypertensives-remains on as needed metoprolol.    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  Hemoglobin stable-follow periodically.   Anxiety/depression/agitation: Stable this morning-calm-quiet and answered most of my questions appropriately.  Continue Paxil and BuSpar.  Maintain delirium precautions.  Due to lethargy/sedation-no longer on Haldol and Seroquel. Deemed to lack capacity by psych.  GERD: Continue PPI  Thrombocytopenia:  resolved.  Generalized weakness/debility: Secondary to acute illness-Per prior note--not able to participate in therapy. Requiring mechanical lift to chair.  Chronic pain:-Scheduled Tylenol 1000 mg 3 times daily.  No longer onoxycodone and reduced IV fentanyl due to AMS.  Goals of care/Ethical issues - complex medical issue with guarded prognosis.  Patient refused HD previously but deemed to lack  capacity by psych. Daughter wishes her to be full code with full scope of care including dialysis.Continued on HD with the help of medications and family.Palliative care following.Ethics involved previously.  DM-2 with hyperglycemia ( A1c 5.7%): No further hypoglycemic episodes-continue Lantus 8 units and SSI.    Lab Results  Component Value Date   HGBA1C 5.7 (H) 01/06/2020   CBG (last 3)  Recent Labs    03/13/20 2343 03/14/20 0350 03/14/20 0741  GLUCAP 114* 119* 126*     Nutrition Problem: Nutrition Problem: Inadequate oral intake Etiology: poor appetite, acute illness Signs/Symptoms: meal completion < 25% Interventions: Tube feeding, Nepro shake  Obesity: Estimated body mass index is 32.66 kg/m as calculated from the following:   Height as of this encounter: '5\' 4"'$  (1.626 m).   Weight as of this encounter: 86.3 kg.   DVT prophylaxis: SQ heparin  Diet: Diet Order            DIET DYS 2 Room service appropriate? No; Fluid consistency: Thin  Diet effective now               Code Status:  Full code   Family Communication:  Spoke to daughter on 5/8  Procedures :  3/5 IJ vas cath-> 4/22 right-sided thoracocentesis>>  Significant studies:  Echo 10/24/2019 > LVEF 55%, Grade 1 DD. RV systolic function normal.  Echo 3/2: LVEF 65-70%, grade II dysfunction. RVSP  Consults:  Nephrology PCCM Psychiatry Palliative care Interventional radiology Ethics  Disposition Plan:  SNF when ready for discharge-awaiting bed.  Status is: Inpatient  Remains inpatient appropriate because:Inpatient level of care appropriate due to severity of illness   Dispo: The patient is from: [facility]              Anticipated d/c is to: SNF              Anticipated d/c date is: > 3 days              Patient currently is medically stable to d/c.  Has SNF bed in [place]-but transportation from SNF to HD is still the main  barrier for discharge   Antimicrobial agents:  Anti-infectives (From admission, onward)   Start     Dose/Rate Route Frequency Ordered Stop   02/28/20 1500  cefTRIAXone (ROCEPHIN) 2 g in sodium chloride 0.9 % 100 mL IVPB     2 g 200 mL/hr over 30 Minutes Intravenous Every 24 hours 02/28/20 1404 03/05/20 2359   02/26/20 1300  cefTRIAXone (ROCEPHIN) 1 g in sodium chloride 0.9 % 100 mL IVPB  Status:  Discontinued     1 g 200 mL/hr over 30 Minutes Intravenous Daily 02/26/20 1201 02/27/20 0930   02/16/20 1230  cefTRIAXone (ROCEPHIN) 2 g in sodium chloride 0.9 % 100 mL IVPB     2 g 200 mL/hr over 30 Minutes Intravenous Every 24 hours 02/16/20 1127 02/20/20 0941   02/16/20 1230  azithromycin (ZITHROMAX) 500 mg in sodium chloride 0.9 % 250 mL IVPB     500 mg 250 mL/hr over 60 Minutes Intravenous Every 24 hours 02/16/20 1127 02/20/20 1140   01/10/20 0930  vancomycin (VANCOCIN) IVPB 1000 mg/200 mL premix  Status:  Discontinued     1,000 mg 200 mL/hr over 60 Minutes Intravenous  Once 01/10/20 0915 01/13/20 0623   01/06/20 2200  ceFEPIme (MAXIPIME) 2 g in sodium chloride 0.9 % 100 mL IVPB     2 g 200 mL/hr over 30 Minutes Intravenous Every 24 hours  01/05/20 2251 01/11/20 2234   01/05/20 2200  ceFEPIme (MAXIPIME) 2 g in sodium chloride 0.9 % 100 mL IVPB     2 g 200 mL/hr over 30 Minutes Intravenous  Once 01/05/20 2148 01/06/20 0015   01/05/20 2200  metroNIDAZOLE (FLAGYL) IVPB 500 mg     500 mg 100 mL/hr over 60 Minutes Intravenous  Once 01/05/20 2148 01/06/20 0015   01/05/20 2200  vancomycin (VANCOCIN) IVPB 1000 mg/200 mL premix  Status:  Discontinued     1,000 mg 200 mL/hr over 60 Minutes Intravenous  Once 01/05/20 2148 01/05/20 2151   01/05/20 2200  vancomycin (VANCOREADY) IVPB 2000 mg/400 mL  Status:  Discontinued     2,000 mg 200 mL/hr over 120 Minutes Intravenous  Once 01/05/20 2151 01/05/20 2223       Time spent: 25 minutes-Greater than 50% of this time was spent in counseling,  explanation of diagnosis, planning of further management, and coordination of care.  MEDICATIONS: Scheduled Meds: . acetaminophen (TYLENOL) oral liquid 160 mg/5 mL  1,000 mg Per Tube Q8H  . busPIRone  5 mg Per Tube BID  . calcium acetate  667 mg Oral TID WC  . camphor-menthol   Topical QID  . chlorhexidine gluconate (MEDLINE KIT)  15 mL Mouth Rinse BID  . Chlorhexidine Gluconate Cloth  6 each Topical Q0600  . darbepoetin (ARANESP) injection - DIALYSIS  200 mcg Subcutaneous Q Wed-1800  . feeding supplement (NEPRO CARB STEADY)  1,000 mL Per Tube Q24H  . feeding supplement (PRO-STAT SUGAR FREE 64)  30 mL Per Tube TID  . heparin injection (subcutaneous)  5,000 Units Subcutaneous Q8H  . insulin aspart  0-9 Units Subcutaneous Q4H  . insulin glargine  8 Units Subcutaneous QHS  . lactulose  20 g Per Tube BID  . pantoprazole sodium  40 mg Per Tube BID  . PARoxetine  40 mg Per Tube Daily  . sodium chloride flush  10-40 mL Intracatheter Q12H   Continuous Infusions:  PRN Meds:.feeding supplement (NEPRO CARB STEADY), guaiFENesin, metoprolol tartrate, ondansetron (ZOFRAN) IV, phenol, promethazine, sodium chloride flush   PHYSICAL EXAM: Vital signs: Vitals:   03/14/20 0000 03/14/20 0400 03/14/20 0655 03/14/20 0741  BP:    (!) 156/64  Pulse:    78  Resp:    19  Temp: 98.4 F (36.9 C) 98.2 F (36.8 C)  98.1 F (36.7 C)  TempSrc: Axillary Axillary  Axillary  SpO2:    100%  Weight:   86.3 kg   Height:       Filed Weights   03/12/20 0400 03/13/20 0355 03/14/20 0655  Weight: 90.4 kg 86.9 kg 86.3 kg   Body mass index is 32.66 kg/m.   EXAM  Awake with flat affect, will not answer questions or follow commands [place].AT,PERRAL Supple Neck,No JVD, No cervical lymphadenopathy appriciated.  Symmetrical Chest wall movement, Good air movement bilaterally, CTAB RRR,No Gallops, Rubs or new Murmurs, No Parasternal Heave +ve B.Sounds, Abd Soft, No tenderness, No organomegaly appriciated, No rebound  - guarding or rigidity. No Cyanosis, Clubbing or edema, No new Rash or bruise   I have personally reviewed following labs and imaging studies  LABORATORY DATA: CBC: Recent Labs  Lab 03/09/20 0152 03/10/20 0925 03/12/20 0727 03/13/20 1235 03/14/20 0511  WBC 4.6 4.8 5.1 5.5 6.0  HGB 7.9* 8.4* 9.1* 8.9* 9.5*  HCT 29.3* 31.0* 32.3* 32.3* 33.9*  MCV 99.7 99.0 97.6 97.9 99.1  PLT 171 182 174 169 188    Basic Metabolic  Panel: Recent Labs  Lab 03/09/20 0152 03/09/20 0152 03/10/20 0805 03/11/20 0408 03/12/20 0727 03/13/20 1235 03/14/20 0511  NA 134*   < > 135 132* 133* 133* 134*  K 3.8   < > 4.0 3.7 3.6 3.5 3.8  CL 94*   < > 94* 98 97* 91* 94*  CO2 26   < > '28 28 25 27 27  '$ GLUCOSE 102*   < > 119* 89 111* 127* 129*  BUN 90*   < > 62* 40* 58* 36* 51*  CREATININE 2.14*   < > 1.64* 1.33* 1.69* 1.36* 1.60*  CALCIUM 9.7   < > 9.4 8.7* 9.1 9.2 9.7  PHOS 5.5*  --  4.1 3.4 4.3  --  4.7*   < > = values in this interval not displayed.    GFR: Estimated Creatinine Clearance: 34.3 mL/min (A) (by C-G formula based on SCr of 1.6 mg/dL (H)).  Liver Function Tests: Recent Labs  Lab 03/10/20 0805 03/11/20 0408 03/12/20 0727 03/13/20 1235 03/14/20 0511  AST  --   --   --  30  --   ALT  --   --   --  19  --   ALKPHOS  --   --   --  201*  --   BILITOT  --   --   --  0.1*  --   PROT  --   --   --  6.4*  --   ALBUMIN 2.3* 2.5* 2.4* 2.5* 2.5*   No results for input(s): LIPASE, AMYLASE in the last 168 hours. Recent Labs  Lab 03/13/20 1235  AMMONIA 55*    Coagulation Profile: No results for input(s): INR, PROTIME in the last 168 hours.  Cardiac Enzymes: No results for input(s): CKTOTAL, CKMB, CKMBINDEX, TROPONINI in the last 168 hours.  BNP (last 3 results) No results for input(s): PROBNP in the last 8760 hours.  Lipid Profile: No results for input(s): CHOL, HDL, LDLCALC, TRIG, CHOLHDL, LDLDIRECT in the last 72 hours.  Thyroid Function Tests: No results for input(s):  TSH, T4TOTAL, FREET4, T3FREE, THYROIDAB in the last 72 hours.  Anemia Panel: No results for input(s): VITAMINB12, FOLATE, FERRITIN, TIBC, IRON, RETICCTPCT in the last 72 hours.  Urine analysis:    Component Value Date/Time   COLORURINE YELLOW 02/25/2020 1600   APPEARANCEUR TURBID (A) 02/25/2020 1600   LABSPEC 1.021 02/25/2020 1600   PHURINE 5.0 02/25/2020 1600   GLUCOSEU NEGATIVE 02/25/2020 1600   HGBUR MODERATE (A) 02/25/2020 1600   BILIRUBINUR NEGATIVE 02/25/2020 1600   KETONESUR NEGATIVE 02/25/2020 1600   PROTEINUR 100 (A) 02/25/2020 1600   NITRITE NEGATIVE 02/25/2020 1600   LEUKOCYTESUR MODERATE (A) 02/25/2020 1600    Sepsis Labs: Lactic Acid, Venous    Component Value Date/Time   LATICACIDVEN 0.7 02/24/2020 1823    MICROBIOLOGY: Recent Results (from the past 240 hour(s))  SARS CORONAVIRUS 2 (TAT 6-24 HRS) Nasopharyngeal Nasopharyngeal Swab     Status: None   Collection Time: 03/08/20  4:16 PM   Specimen: Nasopharyngeal Swab  Result Value Ref Range Status   SARS Coronavirus 2 NEGATIVE NEGATIVE Final    Comment: (NOTE) SARS-CoV-2 target nucleic acids are NOT DETECTED. The SARS-CoV-2 RNA is generally detectable in upper and lower respiratory specimens during the acute phase of infection. Negative results do not preclude SARS-CoV-2 infection, do not rule out co-infections with other pathogens, and should not be used as the sole basis for treatment or other patient management decisions. Negative results must  be combined with clinical observations, patient history, and epidemiological information. The expected result is Negative. Fact Sheet for Patients: SugarRoll.be Fact Sheet for Healthcare Providers: https://www.woods-mathews.com/ This test is not yet approved or cleared by the [place] FDA and  has been authorized for detection and/or diagnosis of SARS-CoV-2 by FDA under an Emergency Use Authorization (EUA). This EUA  will remain  in effect (meaning this test can be used) for the duration of the COVID-19 declaration under Section 56 4(b)(1) of the Act, 21 U.S.C. section 360bbb-3(b)(1), unless the authorization is terminated or revoked sooner. Performed at [facility], [place] [address], [place], [place] [address]     RADIOLOGY STUDIES/RESULTS: No results found.   LOS: 57 days   Signature  Lala Lund M.D on 03/14/2020 at 9:39 AM   -  To page go to [email or web address]

## 2020-03-14 NOTE — Progress Notes (Addendum)
Patient ID: Bailey Cox, female   DOB: 1946/04/18, 74 y.o.   MRN: [id number] S: Continues to refuse to cooperate with staff. O:BP (!) 149/57   Pulse 73   Temp 98.1 F (36.7 C) (Axillary)   Resp 19   Ht '5\' 4"'$  (1.626 m)   Wt 86.3 kg   LMP  (LMP Unknown)   SpO2 99%   BMI 32.66 kg/m   Intake/Output Summary (Last 24 hours) at 03/14/2020 1158 Last data filed at 03/14/2020 1034 Gross per 24 hour  Intake 602.67 ml  Output --  Net 602.67 ml   Intake/Output: I/O last 3 completed shifts: In: 2356 [NG/GT:2356] Out: -   Intake/Output this shift:  Total I/O In: 182.7 [NG/GT:182.7] Out: -  Weight change: -0.6 kg Gen: NAD CVS: no rub Resp: cta Abd: +BS, soft, NT/ND Ext: trace pretibial edema  Recent Labs  Lab 03/08/20 0418 03/09/20 0152 03/10/20 0805 03/11/20 0408 03/12/20 0727 03/13/20 1235 03/14/20 0511  NA 136 134* 135 132* 133* 133* 134*  K 3.9 3.8 4.0 3.7 3.6 3.5 3.8  CL 100 94* 94* 98 97* 91* 94*  CO2 '26 26 28 28 25 27 27  '$ GLUCOSE 75 102* 119* 89 111* 127* 129*  BUN 78* 90* 62* 40* 58* 36* 51*  CREATININE 1.86* 2.14* 1.64* 1.33* 1.69* 1.36* 1.60*  ALBUMIN 2.3* 2.3* 2.3* 2.5* 2.4* 2.5* 2.5*  CALCIUM 9.4 9.7 9.4 8.7* 9.1 9.2 9.7  PHOS 5.2* 5.5* 4.1 3.4 4.3  --  4.7*  AST  --   --   --   --   --  30  --   ALT  --   --   --   --   --  19  --    Liver Function Tests: Recent Labs  Lab 03/12/20 0727 03/13/20 1235 03/14/20 0511  AST  --  30  --   ALT  --  19  --   ALKPHOS  --  201*  --   BILITOT  --  0.1*  --   PROT  --  6.4*  --   ALBUMIN 2.4* 2.5* 2.5*   No results for input(s): LIPASE, AMYLASE in the last 168 hours. Recent Labs  Lab 03/13/20 1235 03/14/20 1006  AMMONIA 55* 43*   CBC: Recent Labs  Lab 03/09/20 0152 03/09/20 0152 03/10/20 0925 03/10/20 0925 03/12/20 0727 03/13/20 1235 03/14/20 0511  WBC 4.6   < > 4.8   < > 5.1 5.5 6.0  HGB 7.9*   < > 8.4*   < > 9.1* 8.9* 9.5*  HCT 29.3*   < > 31.0*   < > 32.3* 32.3* 33.9*  MCV 99.7  --  99.0   --  97.6 97.9 99.1  PLT 171   < > 182   < > 174 169 172   < > = values in this interval not displayed.   Cardiac Enzymes: No results for input(s): CKTOTAL, CKMB, CKMBINDEX, TROPONINI in the last 168 hours. CBG: Recent Labs  Lab 03/13/20 1553 03/13/20 1958 03/13/20 2343 03/14/20 0350 03/14/20 0741  GLUCAP 147* 84 114* 119* 126*    Iron Studies: No results for input(s): IRON, TIBC, TRANSFERRIN, FERRITIN in the last 72 hours. Studies/Results: No results found. Mar[place] Kitchen acetaminophen (TYLENOL) oral liquid 160 mg/5 mL  1,000 mg Per Tube Q8H  . busPIRone  5 mg Per Tube BID  . calcium acetate  667 mg Oral TID WC  . camphor-menthol   Topical QID  . chlorhexidine gluconate (  MEDLINE KIT)  15 mL Mouth Rinse BID  . Chlorhexidine Gluconate Cloth  6 each Topical Q0600  . darbepoetin (ARANESP) injection - DIALYSIS  200 mcg Subcutaneous Q Wed-1800  . feeding supplement (NEPRO CARB STEADY)  1,000 mL Per Tube Q24H  . feeding supplement (PRO-STAT SUGAR FREE 64)  30 mL Per Tube TID  . heparin injection (subcutaneous)  5,000 Units Subcutaneous Q8H  . insulin aspart  0-9 Units Subcutaneous Q4H  . insulin glargine  8 Units Subcutaneous QHS  . lactulose  20 g Per Tube BID  . pantoprazole sodium  40 mg Per Tube BID  . PARoxetine  40 mg Per Tube Daily  . sodium chloride flush  10-40 mL Intracatheter Q12H    BMET    Component Value Date/Time   NA 134 (L) 03/14/2020 0511   K 3.8 03/14/2020 0511   CL 94 (L) 03/14/2020 0511   CO2 27 03/14/2020 0511   GLUCOSE 129 (H) 03/14/2020 0511   BUN 51 (H) 03/14/2020 0511   CREATININE 1.60 (H) 03/14/2020 0511   CALCIUM 9.7 03/14/2020 0511   GFRNONAA 32 (L) 03/14/2020 0511   GFRAA 37 (L) 03/14/2020 0511   CBC    Component Value Date/Time   WBC 6.0 03/14/2020 0511   RBC 3.42 (L) 03/14/2020 0511   HGB 9.5 (L) 03/14/2020 0511   HCT 33.9 (L) 03/14/2020 0511   PLT 172 03/14/2020 0511   MCV 99.1 03/14/2020 0511   MCH 27.8 03/14/2020 0511   MCHC 28.0 (L)  03/14/2020 0511   RDW 17.5 (H) 03/14/2020 0511   LYMPHSABS 0.8 03/03/2020 0515   MONOABS 0.8 03/03/2020 0515   EOSABS 0.1 03/03/2020 0515   BASOSABS 0.1 03/03/2020 0515    Assessment/Plan:  1. New ESRD in setting of OliguricAKI/CKD stage 3- due to ischemic ATN in setting of sepsis. Unfortunately she has remained dialysis dependent since 01/09/20 and will require ongoing IHD. Now on MWF schedule for outpateint HD at [facility], [place].  1. Will need to follow for renal recovery as an outpatient 2. Using TDC 3. Tolerating HD in recliner 4. Remains oliguric with rapidly rising BUN. Scr rising but less than 2 likely due to lack of muscle mass and malnutrition. 5. Continue with MWF schedule. 2. AMS- continues to wax and wane.  Started on lactulose for elevated ammonia and bipap nightly for acute hypercapneic resp failure 3. Debility- continue with PT/OT at SNF 4. Anemia of CKD 5. Sepsis- due to pseudomonas aspiration pneumonia. off pressors and resolved. Off vanco 6. H/o PEA arrest 7. HFpEF- mild volume overload. Will UF as tolerated  8. DM- per primary 9. 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 Was on trach but decannulated on 01/23/20. Continue with BiPAP nightly. 10. Disposition- for discharge to SNF and arranged for outpatient HD in [place], [place], awaiting transportation issues to be resolved.  Donetta Potts, MD [employer] [phone]

## 2020-03-14 NOTE — TOC Progression Note (Addendum)
Transition of Care [facility]) - Progression Note    Patient Details  Name: Bailey Cox MRN: [id number] Date of Birth: [birth year]  Transition of Care [facility]) CM/SW [place], [place] Phone Number: 03/14/2020, 1:56 PM  Clinical Narrative:     CSW tried to call [facility] [place]. Waiting for them to try and call me back. CSW also called [facility]. Receptionist told CSW to call back tomorrow and ask for [employer].  CSW will continue to follow.   Expected Discharge Plan: Skilled Nursing Facility Barriers to Discharge: Continued Medical Work up, Other (comment)(ability to tolerate dialysis in recliner)  Expected Discharge Plan and Services Expected Discharge Plan: [place] In-house Referral: Clinical Social Work Discharge Planning Services: CM Consult Post Acute Care Choice: [place] Living arrangements for the past 2 months: (Has been hospitalized for over 4 months)                                       Social Determinants of Health (SDOH) Interventions    Readmission Risk Interventions Readmission Risk Prevention Plan 01/28/2020  Transportation Screening Complete  PCP or Specialist Appt within 3-5 Days Not Complete  Not Complete comments plan for SNF  HRI or [place] Not Complete  HRI or Home Care Consult comments plan for SNF  Social Work Consult for [place] Planning/Counseling Complete  Palliative Care Screening Complete  Medication Review [employer]) Referral to Pharmacy  PCP or Specialist appointment within 3-5 days of discharge Not Complete  PCP/Specialist Appt Not Complete comments plan for SNF  HRI or Home Care Consult Complete  SW Recovery Care/Counseling Consult Complete  Palliative Care Screening Not Complete  Comments appropriate at this time  [place] Complete

## 2020-03-15 LAB — CBC WITH DIFFERENTIAL/PLATELET
Abs Immature Granulocytes: 0.03 10*3/uL (ref 0.00–0.07)
Basophils Absolute: 0 10*3/uL (ref 0.0–0.1)
Basophils Relative: 1 %
Eosinophils Absolute: 0.2 10*3/uL (ref 0.0–0.5)
Eosinophils Relative: 3 %
HCT: 32.2 % — ABNORMAL LOW (ref 36.0–46.0)
Hemoglobin: 8.7 g/dL — ABNORMAL LOW (ref 12.0–15.0)
Immature Granulocytes: 1 %
Lymphocytes Relative: 12 %
Lymphs Abs: 0.7 10*3/uL (ref 0.7–4.0)
MCH: 26.4 pg (ref 26.0–34.0)
MCHC: 27 g/dL — ABNORMAL LOW (ref 30.0–36.0)
MCV: 97.9 fL (ref 80.0–100.0)
Monocytes Absolute: 0.8 10*3/uL (ref 0.1–1.0)
Monocytes Relative: 12 %
Neutro Abs: 4.5 10*3/uL (ref 1.7–7.7)
Neutrophils Relative %: 71 %
Platelets: 165 10*3/uL (ref 150–400)
RBC: 3.29 MIL/uL — ABNORMAL LOW (ref 3.87–5.11)
RDW: 17.7 % — ABNORMAL HIGH (ref 11.5–15.5)
WBC: 6.3 10*3/uL (ref 4.0–10.5)
nRBC: 0 % (ref 0.0–0.2)

## 2020-03-15 LAB — COMPREHENSIVE METABOLIC PANEL
ALT: 19 U/L (ref 0–44)
AST: 27 U/L (ref 15–41)
Albumin: 2.4 g/dL — ABNORMAL LOW (ref 3.5–5.0)
Alkaline Phosphatase: 207 U/L — ABNORMAL HIGH (ref 38–126)
Anion gap: 14 (ref 5–15)
BUN: 71 mg/dL — ABNORMAL HIGH (ref 8–23)
CO2: 26 mmol/L (ref 22–32)
Calcium: 9.8 mg/dL (ref 8.9–10.3)
Chloride: 94 mmol/L — ABNORMAL LOW (ref 98–111)
Creatinine, Ser: 1.85 mg/dL — ABNORMAL HIGH (ref 0.44–1.00)
GFR calc Af Amer: 31 mL/min — ABNORMAL LOW (ref >60)
GFR calc non Af Amer: 27 mL/min — ABNORMAL LOW (ref >60)
Glucose, Bld: 58 mg/dL — ABNORMAL LOW (ref 70–99)
Potassium: 3.5 mmol/L (ref 3.5–5.1)
Sodium: 134 mmol/L — ABNORMAL LOW (ref 135–145)
Total Bilirubin: 0.2 mg/dL — ABNORMAL LOW (ref 0.3–1.2)
Total Protein: 6.1 g/dL — ABNORMAL LOW (ref 6.5–8.1)

## 2020-03-15 LAB — BRAIN NATRIURETIC PEPTIDE: B Natriuretic Peptide: 1833 pg/mL — ABNORMAL HIGH (ref 0.0–100.0)

## 2020-03-15 LAB — GLUCOSE, CAPILLARY
Glucose-Capillary: 65 mg/dL — ABNORMAL LOW (ref 70–99)
Glucose-Capillary: 107 mg/dL — ABNORMAL HIGH (ref 70–99)
Glucose-Capillary: 119 mg/dL — ABNORMAL HIGH (ref 70–99)
Glucose-Capillary: 108 mg/dL — ABNORMAL HIGH (ref 70–99)

## 2020-03-15 LAB — AMMONIA: Ammonia: 69 umol/L — ABNORMAL HIGH (ref 9–35)

## 2020-03-15 LAB — MAGNESIUM: Magnesium: 2.4 mg/dL (ref 1.7–2.4)

## 2020-03-15 MED ORDER — HEPARIN SODIUM (PORCINE) 1000 UNIT/ML IJ SOLN
INTRAMUSCULAR | Status: AC
  Filled 2020-03-15: qty 6

## 2020-03-15 MED ORDER — HEPARIN SODIUM (PORCINE) 1000 UNIT/ML DIALYSIS: 20.0000 [IU]/kg | INTRAMUSCULAR | Status: DC | PRN

## 2020-03-15 NOTE — TOC Progression Note (Signed)
Transition of Care [facility]) - Progression Note    Patient Details  Name: Bailey Cox MRN: [id number] Date of Birth: 1946/05/30  Transition of Care [facility]) CM/SW [place], LCSW Phone Number: 03/15/2020, 1:50 PM  Clinical Narrative:    CSW left voicemail for [facility] ([facility]) to check on status of dialysis transportation. Patient has no other bed offers.    Expected Discharge Plan: Skilled Nursing Facility Barriers to Discharge: Continued Medical Work up, Other (comment)(ability to tolerate dialysis in recliner)  Expected Discharge Plan and Services Expected Discharge Plan: [place] In-house Referral: Clinical Social Work Discharge Planning Services: CM Consult Post Acute Care Choice: [place] Living arrangements for the past 2 months: (Has been hospitalized for over 4 months)                                       Social Determinants of Health (SDOH) Interventions    Readmission Risk Interventions Readmission Risk Prevention Plan 01/28/2020  Transportation Screening Complete  PCP or Specialist Appt within 3-5 Days Not Complete  Not Complete comments plan for SNF  HRI or [place] Not Complete  HRI or Home Care Consult comments plan for SNF  Social Work Consult for [place] Planning/Counseling Complete  Palliative Care Screening Complete  Medication Review Press photographer) Referral to Pharmacy  PCP or Specialist appointment within 3-5 days of discharge Not Complete  PCP/Specialist Appt Not Complete comments plan for SNF  HRI or Home Care Consult Complete  SW Recovery Care/Counseling Consult Complete  Palliative Care Screening Not Complete  Comments appropriate at this time  [place] Complete

## 2020-03-15 NOTE — Procedures (Signed)
Patient was seen on dialysis and the procedure was supervised.  BFR 400  Via TDC BP is  120/58.   Patient appears to be tolerating treatment well  Louis Meckel 03/15/2020

## 2020-03-15 NOTE — Progress Notes (Signed)
PROGRESS NOTE        PATIENT DETAILS Name: Bailey Cox Age: 74 y.o. Sex: female Date of Birth: Mar 23, 1946 Admit Date: 01/05/2020 Admitting Physician Kandis Cocking, MD JSE:[id number], No Pcp Per  Brief Narrative: 74 year old female with history of cholecystitis status post cholecystectomy in 9/20 at [facility]. She was discharged to SNF but rehospitalized to [facility] and had bile duct stent. Discharged back to SNF but readmitted with Ogilive syndrome. Hospital course complicated by fluid overload and transferred to [facility] for dialysis. At [facility], developed respiratory failure requiring vent support. She also suffered a cardiac arrest during EGD. She underwent trach,she was eventually discharged to [facility] on 3/1 after prolonged hospitalization at [facility] but deemed too sick, and sent to [facility] due to fluid overload and acidosis.  Patient was initially admitted to ICU. Required CRRT 3/9-3/12. Transfer to floor on 3/14. She pulled her trach. Now on oxygen by nasal cannula. She is on Intermitted HD via [place]. Has refused dialysis at times but deemed to lack capacity. Family wants full scope of care including full code and dialysis. She has been noncompliant with care and hemodialysis but lacks capacity. Ethics committee meeting held on 4/6. The plan is to continue full scope of care per family's wish. Finally shetolerated HD in recliner chair on 4/19 without sedating medications. She will be discharged to SNF once she had outpatient HD spot andcontinues to tolerate HD on recliner chair.  She's been started on lactulose for elevated ammonia with concern for hepatic encephalopathy as well as nightly bipap for acute hypercapneic resp failure with altered mental status.    Subjective:  Patient sitting in chair undergoing dialysis, denies any headache chest or abdominal pain.  Denies shortness of breath.     Assessment/Plan:  Acute  metabolic encephalopathy: Multifactorial etiology-including AKI, hypercarbia, hospital/ICU delirium, hyperammonemia, pneumonia.  Encephalopathy is improved-although waxing and waning at times.  TSH/vitamin B12 within normal limits-RPR nonreactive.  Continue supportive care.  Septic shock secondary to UTI and PNA: Sepsis physiology has resolved-has completed a course of antimicrobial therapy.  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  Continue BiPAP nightly - wean oxygen as able.  Avoid sedating medications like narcotics/benzodiazepines as much as possible.  Pseudomonas aeruginosa/aspiration pneumonia with right-sided parapneumonic effusion: Has completed multiple courses of antibiotics-see below.  Patient is s/p  thoracocentesis by PCCM on 4/22-plans are to continue with Rocephin until 5/1.  Maintain aspiration precautions.  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  Noncompliant with HD but lacks capacity.  Essential hypertension: BP controlled-not on scheduled antihypertensives-remains on as needed metoprolol.    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  Hemoglobin stable-follow periodically.   Anxiety/depression/agitation: Stable this morning-calm-quiet and answered most of my questions appropriately.  Continue Paxil and BuSpar.  Maintain delirium precautions.  Due to lethargy/sedation-no longer on Haldol and Seroquel. Deemed to lack capacity by psych.  GERD: Continue PPI  Thrombocytopenia:  resolved.  Generalized weakness/debility: Secondary to acute illness-Per prior note--not able to participate in therapy. Requiring mechanical lift to chair.  Chronic pain:-Scheduled Tylenol 1000 mg 3 times daily.  No longer onoxycodone and reduced IV fentanyl due to AMS.  Goals of  care/Ethical issues - complex medical issue with guarded  prognosis. Patient refused HD previously but deemed to lack capacity by psych. Daughter wishes her to be full code with full scope of care including dialysis.Continued on HD with the help of medications and family.Palliative care following.Ethics involved previously.  DM-2 with hyperglycemia ( A1c 5.7%): No further hypoglycemic episodes-continue Lantus 8 units and SSI.    Lab Results  Component Value Date   HGBA1C 5.7 (H) 01/06/2020   CBG (last 3)  Recent Labs    03/14/20 2340 03/15/20 0407 03/15/20 0432  GLUCAP 300* 65* 107*     Nutrition Problem: Nutrition Problem: Inadequate oral intake Etiology: poor appetite, acute illness Signs/Symptoms: meal completion < 25% Interventions: Tube feeding, Nepro shake  Obesity: Estimated body mass index is 33.49 kg/m as calculated from the following:   Height as of this encounter: _0  (1.626 m).   Weight as of this encounter: 88.5 kg.   DVT prophylaxis: SQ heparin  Diet: Diet Order            DIET DYS 2 Room service appropriate? No; Fluid consistency: Thin  Diet effective now               Code Status:  Full code   Family Communication:  Spoke to daughter Katharine Look [phone] on 03/15/2020  Procedures :  3/5 IJ vas cath-> 4/22 right-sided thoracocentesis>>  Significant studies:  Echo 10/24/2019 > LVEF 55%, Grade 1 DD. RV systolic function normal.  Echo 3/2: LVEF 65-70%, grade II dysfunction. RVSP  Consults:  Nephrology PCCM Psychiatry Palliative care Interventional radiology Ethics  Disposition Plan:  SNF when ready for discharge-awaiting bed.  Status is: Inpatient  Remains inpatient appropriate because:Inpatient level of care appropriate due to severity of illness   Dispo: The patient is from: [facility]              Anticipated d/c is to: SNF              Anticipated d/c date is: > 3 days              Patient currently is medically stable to  d/c.  Has SNF bed in [place]-but transportation from SNF to HD is still the main barrier for discharge   Antimicrobial agents:  Anti-infectives (From admission, onward)   Start     Dose/Rate Route Frequency Ordered Stop   02/28/20 1500  cefTRIAXone (ROCEPHIN) 2 g in sodium chloride 0.9 % 100 mL IVPB     2 g 200 mL/hr over 30 Minutes Intravenous Every 24 hours 02/28/20 1404 03/05/20 2359   02/26/20 1300  cefTRIAXone (ROCEPHIN) 1 g in sodium chloride 0.9 % 100 mL IVPB  Status:  Discontinued     1 g 200 mL/hr over 30 Minutes Intravenous Daily 02/26/20 1201 02/27/20 0930   02/16/20 1230  cefTRIAXone (ROCEPHIN) 2 g in sodium chloride 0.9 % 100 mL IVPB     2 g 200 mL/hr over 30 Minutes Intravenous Every 24 hours 02/16/20 1127 02/20/20 0941   02/16/20 1230  azithromycin (ZITHROMAX) 500 mg in sodium chloride 0.9 % 250 mL IVPB     500 mg 250 mL/hr over 60 Minutes Intravenous Every 24 hours 02/16/20 1127 02/20/20 1140   01/10/20 0930  vancomycin (VANCOCIN) IVPB 1000 mg/200 mL premix  Status:  Discontinued     1,000 mg 200 mL/hr over 60 Minutes Intravenous  Once 01/10/20 0915 01/13/20 0623   01/06/20 2200  ceFEPIme (MAXIPIME) 2 g in sodium chloride 0.9 % 100 mL IVPB  2 g 200 mL/hr over 30 Minutes Intravenous Every 24 hours 01/05/20 2251 01/11/20 2234   01/05/20 2200  ceFEPIme (MAXIPIME) 2 g in sodium chloride 0.9 % 100 mL IVPB     2 g 200 mL/hr over 30 Minutes Intravenous  Once 01/05/20 2148 01/06/20 0015   01/05/20 2200  metroNIDAZOLE (FLAGYL) IVPB 500 mg     500 mg 100 mL/hr over 60 Minutes Intravenous  Once 01/05/20 2148 01/06/20 0015   01/05/20 2200  vancomycin (VANCOCIN) IVPB 1000 mg/200 mL premix  Status:  Discontinued     1,000 mg 200 mL/hr over 60 Minutes Intravenous  Once 01/05/20 2148 01/05/20 2151   01/05/20 2200  vancomycin (VANCOREADY) IVPB 2000 mg/400 mL  Status:  Discontinued     2,000 mg 200 mL/hr over 120 Minutes Intravenous  Once 01/05/20 2151 01/05/20 2223        Time spent: 25 minutes-Greater than 50% of this time was spent in counseling, explanation of diagnosis, planning of further management, and coordination of care.  MEDICATIONS: Scheduled Meds: . acetaminophen (TYLENOL) oral liquid 160 mg/5 mL  1,000 mg Per Tube Q8H  . busPIRone  5 mg Per Tube BID  . calcium acetate  667 mg Oral TID WC  . camphor-menthol   Topical QID  . chlorhexidine gluconate (MEDLINE KIT)  15 mL Mouth Rinse BID  . Chlorhexidine Gluconate Cloth  6 each Topical Q0600  . darbepoetin (ARANESP) injection - DIALYSIS  200 mcg Subcutaneous Q Wed-1800  . feeding supplement (NEPRO CARB STEADY)  1,000 mL Per Tube Q24H  . feeding supplement (PRO-STAT SUGAR FREE 64)  30 mL Per Tube TID  . heparin injection (subcutaneous)  5,000 Units Subcutaneous Q8H  . heparin sodium (porcine)      . insulin aspart  0-9 Units Subcutaneous Q4H  . insulin glargine  8 Units Subcutaneous QHS  . lactulose  20 g Per Tube BID  . pantoprazole sodium  40 mg Per Tube BID  . PARoxetine  40 mg Per Tube Daily  . sodium chloride flush  10-40 mL Intracatheter Q12H   Continuous Infusions:  PRN Meds:.feeding supplement (NEPRO CARB STEADY), guaiFENesin, heparin, metoprolol tartrate, ondansetron (ZOFRAN) IV, phenol, promethazine, sodium chloride flush   PHYSICAL EXAM: Vital signs: Vitals:   03/15/20 0815 03/15/20 0845 03/15/20 0915 03/15/20 0945  BP: 139/60 (!) 137/58 136/60 (!) 127/54  Pulse: 77 74 74 73  Resp: 19     Temp:      TempSrc:      SpO2:      Weight:      Height:       Filed Weights   03/13/20 0355 03/14/20 0655 03/15/20 0558  Weight: 86.9 kg 86.3 kg 88.5 kg   Body mass index is 33.49 kg/m.   EXAM  Awake with flat affect, but answers a few questions and follows basic commands when she was willing to, sitting in chair undergoing HD. [place].AT,PERRAL Supple Neck,No JVD, No cervical lymphadenopathy appriciated.  Symmetrical Chest wall movement, Good air movement bilaterally, CTAB  RRR,No Gallops, Rubs or new Murmurs, No Parasternal Heave +ve B.Sounds, Abd Soft, No tenderness, No organomegaly appriciated, No rebound - guarding or rigidity.  PEG tube in place. No Cyanosis, Clubbing or edema, No new Rash or bruise    I have personally reviewed following labs and imaging studies  LABORATORY DATA: CBC: Recent Labs  Lab 03/10/20 0925 03/12/20 0727 03/13/20 1235 03/14/20 0511 03/15/20 0218  WBC 4.8 5.1 5.5 6.0 6.3  NEUTROABS  --   --   --   --  4.5  HGB 8.4* 9.1* 8.9* 9.5* 8.7*  HCT 31.0* 32.3* 32.3* 33.9* 32.2*  MCV 99.0 97.6 97.9 99.1 97.9  PLT 182 174 169 172 408    Basic Metabolic Panel: Recent Labs  Lab 03/09/20 0152 03/09/20 0152 03/10/20 0805 03/10/20 0805 03/11/20 0408 03/12/20 0727 03/13/20 1235 03/14/20 0511 03/14/20 1006 03/15/20 0218  NA 134*   < > 135   < > 132* 133* 133* 134*  --  134*  K 3.8   < > 4.0   < > 3.7 3.6 3.5 3.8  --  3.5  CL 94*   < > 94*   < > 98 97* 91* 94*  --  94*  CO2 26   < > 28   < > _0 --  26  GLUCOSE 102*   < > 119*   < > 89 111* 127* 129*  --  58*  BUN 90*   < > 62*   < > 40* 58* 36* 51*  --  71*  CREATININE 2.14*   < > 1.64*   < > 1.33* 1.69* 1.36* 1.60*  --  1.85*  CALCIUM 9.7   < > 9.4   < > 8.7* 9.1 9.2 9.7  --  9.8  MG  --   --   --   --   --   --   --   --  2.4 2.4  PHOS 5.5*  --  4.1  --  3.4 4.3  --  4.7*  --   --    < > = values in this interval not displayed.    GFR: Estimated Creatinine Clearance: 30 mL/min (A) (by C-G formula based on SCr of 1.85 mg/dL (H)).  Liver Function Tests: Recent Labs  Lab 03/11/20 0408 03/12/20 0727 03/13/20 1235 03/14/20 0511 03/15/20 0218  AST  --   --  30  --  27  ALT  --   --  19  --  19  ALKPHOS  --   --  201*  --  207*  BILITOT  --   --  0.1*  --  0.2*  PROT  --   --  6.4*  --  6.1*  ALBUMIN 2.5* 2.4* 2.5* 2.5* 2.4*   No results for input(s): LIPASE, AMYLASE in the last 168 hours. Recent Labs  Lab 03/13/20 1235 03/14/20 1006  AMMONIA 55*  43*    Coagulation Profile: No results for input(s): INR, PROTIME in the last 168 hours.  Cardiac Enzymes: No results for input(s): CKTOTAL, CKMB, CKMBINDEX, TROPONINI in the last 168 hours.  BNP (last 3 results) No results for input(s): PROBNP in the last 8760 hours.  Lipid Profile: No results for input(s): CHOL, HDL, LDLCALC, TRIG, CHOLHDL, LDLDIRECT in the last 72 hours.  Thyroid Function Tests: No results for input(s): TSH, T4TOTAL, FREET4, T3FREE, THYROIDAB in the last 72 hours.  Anemia Panel: No results for input(s): VITAMINB12, FOLATE, FERRITIN, TIBC, IRON, RETICCTPCT in the last 72 hours.  Urine analysis:    Component Value Date/Time   COLORURINE YELLOW 02/25/2020 1600   APPEARANCEUR TURBID (A) 02/25/2020 1600   LABSPEC 1.021 02/25/2020 1600   PHURINE 5.0 02/25/2020 1600   GLUCOSEU NEGATIVE 02/25/2020 1600   HGBUR MODERATE (A) 02/25/2020 1600   BILIRUBINUR NEGATIVE 02/25/2020 1600   KETONESUR NEGATIVE 02/25/2020 1600   PROTEINUR 100 (A) 02/25/2020 1600   NITRITE NEGATIVE 02/25/2020 1600   LEUKOCYTESUR MODERATE (A) 02/25/2020 1600    Sepsis  Labs: Lactic Acid, Venous    Component Value Date/Time   LATICACIDVEN 0.7 02/24/2020 1823    MICROBIOLOGY: Recent Results (from the past 240 hour(s))  SARS CORONAVIRUS 2 (TAT 6-24 HRS) Nasopharyngeal Nasopharyngeal Swab     Status: None   Collection Time: 03/08/20  4:16 PM   Specimen: Nasopharyngeal Swab  Result Value Ref Range Status   SARS Coronavirus 2 NEGATIVE NEGATIVE Final    Comment: (NOTE) SARS-CoV-2 target nucleic acids are NOT DETECTED. The SARS-CoV-2 RNA is generally detectable in upper and lower respiratory specimens during the acute phase of infection. Negative results do not preclude SARS-CoV-2 infection, do not rule out co-infections with other pathogens, and should not be used as the sole basis for treatment or other patient management decisions. Negative results must be combined with clinical  observations, patient history, and epidemiological information. The expected result is Negative. Fact Sheet for Patients: SugarRoll.be Fact Sheet for Healthcare Providers: https://www.woods-mathews.com/ This test is not yet approved or cleared by the [place] FDA and  has been authorized for detection and/or diagnosis of SARS-CoV-2 by FDA under an Emergency Use Authorization (EUA). This EUA will remain  in effect (meaning this test can be used) for the duration of the COVID-19 declaration under Section 56 4(b)(1) of the Act, 21 U.S.C. section 360bbb-3(b)(1), unless the authorization is terminated or revoked sooner. Performed at [facility], [place] [address] [place], [place] [address]     RADIOLOGY STUDIES/RESULTS: No results found.   LOS: 70 days   Signature  Lala Lund M.D on 03/15/2020 at 9:54 AM   -  To page go to [email or web address]

## 2020-03-15 NOTE — Progress Notes (Signed)
Pts POC BG 65, pt asymptomatic, 8oz of orange juice given, rechecked per protocol, repeat BG 107.

## 2020-03-15 NOTE — Progress Notes (Signed)
Patient ID: Bailey Cox, female   DOB: Jan 06, 1946, 74 y.o.   MRN: [id number] S: seen on HD - pleasant- no c/o's    O:BP (!) 120/58   Pulse 71   Temp 98 F (36.7 C) (Oral)   Resp 19   Ht '5\' 4"'$  (1.626 m)   Wt 88.5 kg   LMP  (LMP Unknown)   SpO2 100%   BMI 33.49 kg/m   Intake/Output Summary (Last 24 hours) at 03/15/2020 1027 Last data filed at 03/14/2020 1852 Gross per 24 hour  Intake 394.67 ml  Output 2 ml  Net 392.67 ml   Intake/Output: I/O last 3 completed shifts: In: 514.7 [NG/GT:514.7] Out: 2 [Urine:1; Stool:1]  Intake/Output this shift:  No intake/output data recorded. Weight change: 2.2 kg Gen: NAD CVS: no rub Resp: cta Abd: +BS, soft, NT/ND Ext: trace pretibial edema  Recent Labs  Lab 03/09/20 0152 03/10/20 0805 03/11/20 0408 03/12/20 0727 03/13/20 1235 03/14/20 0511 03/15/20 0218  NA 134* 135 132* 133* 133* 134* 134*  K 3.8 4.0 3.7 3.6 3.5 3.8 3.5  CL 94* 94* 98 97* 91* 94* 94*  CO2 '26 28 28 25 27 27 26  '$ GLUCOSE 102* 119* 89 111* 127* 129* 58*  BUN 90* 62* 40* 58* 36* 51* 71*  CREATININE 2.14* 1.64* 1.33* 1.69* 1.36* 1.60* 1.85*  ALBUMIN 2.3* 2.3* 2.5* 2.4* 2.5* 2.5* 2.4*  CALCIUM 9.7 9.4 8.7* 9.1 9.2 9.7 9.8  PHOS 5.5* 4.1 3.4 4.3  --  4.7*  --   AST  --   --   --   --  30  --  27  ALT  --   --   --   --  19  --  19   Liver Function Tests: Recent Labs  Lab 03/13/20 1235 03/14/20 0511 03/15/20 0218  AST 30  --  27  ALT 19  --  19  ALKPHOS 201*  --  207*  BILITOT 0.1*  --  0.2*  PROT 6.4*  --  6.1*  ALBUMIN 2.5* 2.5* 2.4*   No results for input(s): LIPASE, AMYLASE in the last 168 hours. Recent Labs  Lab 03/13/20 1235 03/14/20 1006  AMMONIA 55* 43*   CBC: Recent Labs  Lab 03/10/20 0925 03/10/20 0925 03/12/20 0727 03/12/20 0727 03/13/20 1235 03/14/20 0511 03/15/20 0218  WBC 4.8   < > 5.1   < > 5.5 6.0 6.3  NEUTROABS  --   --   --   --   --   --  4.5  HGB 8.4*   < > 9.1*   < > 8.9* 9.5* 8.7*  HCT 31.0*   < > 32.3*   < >  32.3* 33.9* 32.2*  MCV 99.0  --  97.6  --  97.9 99.1 97.9  PLT 182   < > 174   < > 169 172 165   < > = values in this interval not displayed.   Cardiac Enzymes: No results for input(s): CKTOTAL, CKMB, CKMBINDEX, TROPONINI in the last 168 hours. CBG: Recent Labs  Lab 03/14/20 1621 03/14/20 2007 03/14/20 2340 03/15/20 0407 03/15/20 0432  GLUCAP 101* 170* 300* 65* 107*    Iron Studies: No results for input(s): IRON, TIBC, TRANSFERRIN, FERRITIN in the last 72 hours. Studies/Results: No results found. Marland Kitchen acetaminophen (TYLENOL) oral liquid 160 mg/5 mL  1,000 mg Per Tube Q8H  . busPIRone  5 mg Per Tube BID  . calcium acetate  667 mg Oral TID WC  .  camphor-menthol   Topical QID  . chlorhexidine gluconate (MEDLINE KIT)  15 mL Mouth Rinse BID  . Chlorhexidine Gluconate Cloth  6 each Topical Q0600  . darbepoetin (ARANESP) injection - DIALYSIS  200 mcg Subcutaneous Q Wed-1800  . feeding supplement (NEPRO CARB STEADY)  1,000 mL Per Tube Q24H  . feeding supplement (PRO-STAT SUGAR FREE 64)  30 mL Per Tube TID  . heparin injection (subcutaneous)  5,000 Units Subcutaneous Q8H  . heparin sodium (porcine)      . insulin aspart  0-9 Units Subcutaneous Q4H  . insulin glargine  8 Units Subcutaneous QHS  . lactulose  20 g Per Tube BID  . pantoprazole sodium  40 mg Per Tube BID  . PARoxetine  40 mg Per Tube Daily  . sodium chloride flush  10-40 mL Intracatheter Q12H    BMET    Component Value Date/Time   NA 134 (L) 03/15/2020 0218   K 3.5 03/15/2020 0218   CL 94 (L) 03/15/2020 0218   CO2 26 03/15/2020 0218   GLUCOSE 58 (L) 03/15/2020 0218   BUN 71 (H) 03/15/2020 0218   CREATININE 1.85 (H) 03/15/2020 0218   CALCIUM 9.8 03/15/2020 0218   GFRNONAA 27 (L) 03/15/2020 0218   GFRAA 31 (L) 03/15/2020 0218   CBC    Component Value Date/Time   WBC 6.3 03/15/2020 0218   RBC 3.29 (L) 03/15/2020 0218   HGB 8.7 (L) 03/15/2020 0218   HCT 32.2 (L) 03/15/2020 0218   PLT 165 03/15/2020 0218   MCV  97.9 03/15/2020 0218   MCH 26.4 03/15/2020 0218   MCHC 27.0 (L) 03/15/2020 0218   RDW 17.7 (H) 03/15/2020 0218   LYMPHSABS 0.7 03/15/2020 0218   MONOABS 0.8 03/15/2020 0218   EOSABS 0.2 03/15/2020 0218   BASOSABS 0.0 03/15/2020 0218    Assessment/Plan:  1. New ESRD in setting of OliguricAKI/CKD stage 3- due to ischemic ATN in setting of sepsis. Unfortunately she has remained dialysis dependent since 01/09/20 and will require ongoing IHD. Now on MWF schedule for outpateint HD [facility] [place].   1. Using [place] 2. Tolerating HD in recliner 3. Remains oliguric with rapidly rising BUN. Scr rising but less than 2 likely due to lack of muscle mass and malnutrition. 4. Continue with MWF schedule. 2. AMS- continues to wax and wane.  Started on lactulose for elevated ammonia and bipap nightly for acute hypercapneic resp failure 3. Debility- continue with PT/OT at SNF 4. Anemia of CKD- on high dose ESA 5. Sepsis- due to pseudomonas aspiration pneumonia. off pressors and resolved. Off vanco 6. H/o PEA arrest 7. HFpEF- mild volume overload. Will UF as tolerated  8. DM- per primary 9. 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 Was on trach but decannulated on 01/23/20. Continue with BiPAP nightly. 10. Disposition- for discharge to SNF and arranged for outpatient HD in [place], [place], awaiting transportation issues to be resolved.  Louis Meckel  [employer] [phone]

## 2020-03-16 LAB — GLUCOSE, CAPILLARY
Glucose-Capillary: 112 mg/dL — ABNORMAL HIGH (ref 70–99)
Glucose-Capillary: 118 mg/dL — ABNORMAL HIGH (ref 70–99)
Glucose-Capillary: 118 mg/dL — ABNORMAL HIGH (ref 70–99)
Glucose-Capillary: 123 mg/dL — ABNORMAL HIGH (ref 70–99)
Glucose-Capillary: 132 mg/dL — ABNORMAL HIGH (ref 70–99)

## 2020-03-16 LAB — RENAL FUNCTION PANEL
Albumin: 2.3 g/dL — ABNORMAL LOW (ref 3.5–5.0)
Anion gap: 9 (ref 5–15)
BUN: 54 mg/dL — ABNORMAL HIGH (ref 8–23)
CO2: 30 mmol/L (ref 22–32)
Calcium: 9.1 mg/dL (ref 8.9–10.3)
Chloride: 94 mmol/L — ABNORMAL LOW (ref 98–111)
Creatinine, Ser: 1.63 mg/dL — ABNORMAL HIGH (ref 0.44–1.00)
GFR calc Af Amer: 36 mL/min — ABNORMAL LOW (ref >60)
GFR calc non Af Amer: 31 mL/min — ABNORMAL LOW (ref >60)
Glucose, Bld: 121 mg/dL — ABNORMAL HIGH (ref 70–99)
Phosphorus: 4 mg/dL (ref 2.5–4.6)
Potassium: 3.5 mmol/L (ref 3.5–5.1)
Sodium: 133 mmol/L — ABNORMAL LOW (ref 135–145)

## 2020-03-16 LAB — COMPREHENSIVE METABOLIC PANEL
ALT: 20 U/L (ref 0–44)
AST: 28 U/L (ref 15–41)
Albumin: 2.5 g/dL — ABNORMAL LOW (ref 3.5–5.0)
Alkaline Phosphatase: 208 U/L — ABNORMAL HIGH (ref 38–126)
Anion gap: 11 (ref 5–15)
BUN: 42 mg/dL — ABNORMAL HIGH (ref 8–23)
CO2: 32 mmol/L (ref 22–32)
Calcium: 9 mg/dL (ref 8.9–10.3)
Chloride: 94 mmol/L — ABNORMAL LOW (ref 98–111)
Creatinine, Ser: 1.27 mg/dL — ABNORMAL HIGH (ref 0.44–1.00)
GFR calc Af Amer: 49 mL/min — ABNORMAL LOW (ref >60)
GFR calc non Af Amer: 42 mL/min — ABNORMAL LOW (ref >60)
Glucose, Bld: 118 mg/dL — ABNORMAL HIGH (ref 70–99)
Potassium: 3.4 mmol/L — ABNORMAL LOW (ref 3.5–5.1)
Sodium: 137 mmol/L (ref 135–145)
Total Bilirubin: 0.3 mg/dL (ref 0.3–1.2)
Total Protein: 6.6 g/dL (ref 6.5–8.1)

## 2020-03-16 LAB — CBC WITH DIFFERENTIAL/PLATELET
Abs Immature Granulocytes: 0.03 10*3/uL (ref 0.00–0.07)
Basophils Absolute: 0.1 10*3/uL (ref 0.0–0.1)
Basophils Relative: 1 %
Eosinophils Absolute: 0.2 10*3/uL (ref 0.0–0.5)
Eosinophils Relative: 2 %
HCT: 34.5 % — ABNORMAL LOW (ref 36.0–46.0)
Hemoglobin: 9.3 g/dL — ABNORMAL LOW (ref 12.0–15.0)
Immature Granulocytes: 0 %
Lymphocytes Relative: 9 %
Lymphs Abs: 0.7 10*3/uL (ref 0.7–4.0)
MCH: 27 pg (ref 26.0–34.0)
MCHC: 27 g/dL — ABNORMAL LOW (ref 30.0–36.0)
MCV: 100 fL (ref 80.0–100.0)
Monocytes Absolute: 0.8 10*3/uL (ref 0.1–1.0)
Monocytes Relative: 11 %
Neutro Abs: 5.6 10*3/uL (ref 1.7–7.7)
Neutrophils Relative %: 77 %
Platelets: 182 10*3/uL (ref 150–400)
RBC: 3.45 MIL/uL — ABNORMAL LOW (ref 3.87–5.11)
RDW: 17.7 % — ABNORMAL HIGH (ref 11.5–15.5)
WBC: 7.3 10*3/uL (ref 4.0–10.5)
nRBC: 0 % (ref 0.0–0.2)

## 2020-03-16 LAB — AMMONIA: Ammonia: 65 umol/L — ABNORMAL HIGH (ref 9–35)

## 2020-03-16 LAB — CBC
HCT: 32.3 % — ABNORMAL LOW (ref 36.0–46.0)
Hemoglobin: 8.7 g/dL — ABNORMAL LOW (ref 12.0–15.0)
MCH: 26.7 pg (ref 26.0–34.0)
MCHC: 26.9 g/dL — ABNORMAL LOW (ref 30.0–36.0)
MCV: 99.1 fL (ref 80.0–100.0)
Platelets: 149 10*3/uL — ABNORMAL LOW (ref 150–400)
RBC: 3.26 MIL/uL — ABNORMAL LOW (ref 3.87–5.11)
RDW: 17.5 % — ABNORMAL HIGH (ref 11.5–15.5)
WBC: 6.3 10*3/uL (ref 4.0–10.5)
nRBC: 0 % (ref 0.0–0.2)

## 2020-03-16 LAB — MAGNESIUM: Magnesium: 2.3 mg/dL (ref 1.7–2.4)

## 2020-03-16 LAB — BRAIN NATRIURETIC PEPTIDE: B Natriuretic Peptide: 1643.4 pg/mL — ABNORMAL HIGH (ref 0.0–100.0)

## 2020-03-16 MED ORDER — CHLORHEXIDINE GLUCONATE CLOTH 2 % EX PADS
6.0000 | MEDICATED_PAD | Freq: Every day | CUTANEOUS | Status: DC
  Administered 2020-03-17 – 2020-03-19 (×2): 6 via TOPICAL

## 2020-03-16 MED ORDER — HEPARIN SODIUM (PORCINE) 1000 UNIT/ML DIALYSIS: 20.0000 [IU]/kg | INTRAMUSCULAR | Status: DC | PRN

## 2020-03-16 NOTE — Progress Notes (Signed)
Patient ID: Dhrithi Riche, female   DOB: 1946/03/07, 74 y.o.   MRN: [id number]   S: resting comfortably in room-  I did not wake. HD yest-  Removed 2000- tolerated well    O:BP (!) 142/58 (BP Location: Left Arm)   Pulse 88   Temp 98 F (36.7 C) (Oral)   Resp 18   Ht '5\' 4"'$  (1.626 m)   Wt 86.3 kg   LMP  (LMP Unknown)   SpO2 99%   BMI 32.66 kg/m   Intake/Output Summary (Last 24 hours) at 03/16/2020 1249 Last data filed at 03/16/2020 8546 Gross per 24 hour  Intake 10 ml  Output --  Net 10 ml   Intake/Output: I/O last 3 completed shifts: In: -  Out: 2000 [Other:2000]  Intake/Output this shift:  Total I/O In: 10 [I.V.:10] Out: -  Weight change: -2.2 kg Gen: NAD CVS: no rub Resp: cta Abd: +BS, soft, NT/ND Ext: trace pretibial edema  Recent Labs  Lab 03/10/20 0805 03/11/20 0408 03/12/20 0727 03/13/20 1235 03/14/20 0511 03/15/20 0218 03/16/20 0333  NA 135 132* 133* 133* 134* 134* 137  K 4.0 3.7 3.6 3.5 3.8 3.5 3.4*  CL 94* 98 97* 91* 94* 94* 94*  CO2 '28 28 25 27 27 26 '$ 32  GLUCOSE 119* 89 111* 127* 129* 58* 118*  BUN 62* 40* 58* 36* 51* 71* 42*  CREATININE 1.64* 1.33* 1.69* 1.36* 1.60* 1.85* 1.27*  ALBUMIN 2.3* 2.5* 2.4* 2.5* 2.5* 2.4* 2.5*  CALCIUM 9.4 8.7* 9.1 9.2 9.7 9.8 9.0  PHOS 4.1 3.4 4.3  --  4.7*  --   --   AST  --   --   --  30  --  27 28  ALT  --   --   --  19  --  19 20   Liver Function Tests: Recent Labs  Lab 03/13/20 1235 03/13/20 1235 03/14/20 0511 03/15/20 0218 03/16/20 0333  AST 30  --   --  27 28  ALT 19  --   --  19 20  ALKPHOS 201*  --   --  207* 208*  BILITOT 0.1*  --   --  0.2* 0.3  PROT 6.4*  --   --  6.1* 6.6  ALBUMIN 2.5*   < > 2.5* 2.4* 2.5*   < > = values in this interval not displayed.   No results for input(s): LIPASE, AMYLASE in the last 168 hours. Recent Labs  Lab 03/14/20 1006 03/15/20 1432 03/16/20 0333  AMMONIA 43* 69* 65*   CBC: Recent Labs  Lab 03/12/20 0727 03/12/20 0727 03/13/20 1235 03/13/20 1235  03/14/20 0511 03/15/20 0218 03/16/20 0333  WBC 5.1   < > 5.5   < > 6.0 6.3 7.3  NEUTROABS  --   --   --   --   --  4.5 5.6  HGB 9.1*   < > 8.9*   < > 9.5* 8.7* 9.3*  HCT 32.3*   < > 32.3*   < > 33.9* 32.2* 34.5*  MCV 97.6  --  97.9  --  99.1 97.9 100.0  PLT 174   < > 169   < > 172 165 182   < > = values in this interval not displayed.   Cardiac Enzymes: No results for input(s): CKTOTAL, CKMB, CKMBINDEX, TROPONINI in the last 168 hours. CBG: Recent Labs  Lab 03/15/20 2014 03/16/20 0021 03/16/20 0318 03/16/20 0742 03/16/20 1242  GLUCAP 108* 118* 132* 123* 118*  Iron Studies: No results for input(s): IRON, TIBC, TRANSFERRIN, FERRITIN in the last 72 hours. Studies/Results: No results found. Marland Kitchen acetaminophen (TYLENOL) oral liquid 160 mg/5 mL  1,000 mg Per Tube Q8H  . busPIRone  5 mg Per Tube BID  . calcium acetate  667 mg Oral TID WC  . camphor-menthol   Topical QID  . chlorhexidine gluconate (MEDLINE KIT)  15 mL Mouth Rinse BID  . Chlorhexidine Gluconate Cloth  6 each Topical Q0600  . darbepoetin (ARANESP) injection - DIALYSIS  200 mcg Subcutaneous Q Wed-1800  . feeding supplement (NEPRO CARB STEADY)  1,000 mL Per Tube Q24H  . feeding supplement (PRO-STAT SUGAR FREE 64)  30 mL Per Tube TID  . heparin injection (subcutaneous)  5,000 Units Subcutaneous Q8H  . insulin aspart  0-9 Units Subcutaneous Q4H  . insulin glargine  8 Units Subcutaneous QHS  . lactulose  20 g Per Tube BID  . pantoprazole sodium  40 mg Per Tube BID  . PARoxetine  40 mg Per Tube Daily  . sodium chloride flush  10-40 mL Intracatheter Q12H    BMET    Component Value Date/Time   NA 137 03/16/2020 0333   K 3.4 (L) 03/16/2020 0333   CL 94 (L) 03/16/2020 0333   CO2 32 03/16/2020 0333   GLUCOSE 118 (H) 03/16/2020 0333   BUN 42 (H) 03/16/2020 0333   CREATININE 1.27 (H) 03/16/2020 0333   CALCIUM 9.0 03/16/2020 0333   GFRNONAA 42 (L) 03/16/2020 0333   GFRAA 49 (L) 03/16/2020 0333   CBC    Component  Value Date/Time   WBC 7.3 03/16/2020 0333   RBC 3.45 (L) 03/16/2020 0333   HGB 9.3 (L) 03/16/2020 0333   HCT 34.5 (L) 03/16/2020 0333   PLT 182 03/16/2020 0333   MCV 100.0 03/16/2020 0333   MCH 27.0 03/16/2020 0333   MCHC 27.0 (L) 03/16/2020 0333   RDW 17.7 (H) 03/16/2020 0333   LYMPHSABS 0.7 03/16/2020 0333   MONOABS 0.8 03/16/2020 0333   EOSABS 0.2 03/16/2020 0333   BASOSABS 0.1 03/16/2020 0333    Assessment/Plan:  1. New ESRD in setting of OliguricAKI/CKD stage 3- due to ischemic ATN in setting of sepsis. She has remained dialysis dependent since 01/09/20 and will require ongoing IHD. Now on MWF schedule for outpatient HD at [facility] [place].   1. Using [facility] 2. Tolerating HD in recliner 3. Remains oliguric with rapidly rising BUN. Scr rising but less than 2 likely due to lack of muscle mass and malnutrition. 4. Continue with MWF schedule- next due tomorrow. 2. AMS- continues to wax and wane.  Started on lactulose for elevated ammonia and bipap nightly for acute hypercapneic resp failure  3. Anemia of CKD- on high dose ESA  4. H/o PEA arrest 5. HFpEF- mild volume overload. Will UF as tolerated  6. DM- per primary 7. 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 Was on trach but decannulated on 01/23/20. Continue with BiPAP nightly. 8. Bones-  Phos is good on phoslo-  PTH 59 at last check - no meds 9. Disposition- for discharge to SNF and arranged for outpatient HD in [place], [place], awaiting transportation issues to be resolved.  Louis Meckel  [employer] [phone]

## 2020-03-16 NOTE — Progress Notes (Signed)
Nutrition Follow-up  DOCUMENTATION CODES:   Not applicable  INTERVENTION:   Continue TF via PEG: Nepro at goal rate of 40 ml/hr Prostat 30 ml TID   Provides 2028 kcal (100% of needs), 123 grams of protein, and 701 ml water.   Continue Nepro Shake po prn, each supplement provides 425 kcal and 19 grams of protein.   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 3/19- s/p decannulation. Pt with AKI/CKD stage 3 now iHD dependent.  Last HD 5/10. Plans for outpatient HD on M-W-F schedule in [place], [place]. Awaiting resolution of transportation issues prior to d/c to SNF in [place].   Remains on a dysphagia 2 diet with thin liquids. Patient continues to refuse meals; meal completion documented at 0% for the past 4 days. Pt has been tolerating tube feeds well. Tube feeding to continue to provide 100% of patient's nutrition needs as pt with very poor to no PO.   Labs reviewed. K 3.4 (L) CBG's: 118-132-123-118  Medications reviewed and include phoslo, aranesp, novolog, lantus, lactulose.  Mild generalized edema and mild edema to BUE charted today.   Intake/Output Summary (Last 24 hours) at 03/16/2020 1302 Last data filed at  03/16/2020 1518 Gross per 24 hour  Intake 10 ml  Output --  Net 10 ml     Diet Order:   Diet Order            DIET DYS 2 Room service appropriate? No; Fluid consistency: Thin  Diet effective now              EDUCATION NEEDS:   No education needs have been identified at this time  Skin:  Skin Assessment: Skin Integrity Issues: Skin Integrity Issues:: Other (Comment) Stage I: N/A Other: MASD abdomen  Last BM:  5/11 type 6  Height:   Ht Readings from Last 1 Encounters:  01/05/20 '5\' 4"'$  (1.626 m)    Weight:   Wt Readings from Last 1 Encounters:  03/16/20 86.3 kg    BMI:  Body mass index is 32.66 kg/m.  Estimated Nutritional Needs:   Kcal:  1900-2200  Protein:  110-125 grams  Fluid:  1 L + UOP   Lucas Mallow, RD, LDN, CNSC Please refer to Amion for contact information.

## 2020-03-16 NOTE — Progress Notes (Signed)
PROGRESS NOTE        PATIENT DETAILS Name: Bailey Cox Age: 74 y.o. Sex: female Date of Birth: April 18, 1946 Admit Date: 01/05/2020 Admitting Physician Kandis Cocking, MD IZT:[id number], No Pcp Per  Brief Narrative: 74 year old female with history of cholecystitis status post cholecystectomy in 9/20 at [facility]. She was discharged to SNF but rehospitalized to [facility] and had bile duct stent. Discharged back to SNF but readmitted with Ogilive syndrome. Hospital course complicated by fluid overload and transferred to [facility] for dialysis. At [facility], developed respiratory failure requiring vent support. She also suffered a cardiac arrest during EGD. She underwent trach,she was eventually discharged to [facility] on 3/1 after prolonged hospitalization at [facility] but deemed too sick, and sent to [facility] due to fluid overload and acidosis.  Patient was initially admitted to ICU. Required CRRT 3/9-3/12. Transfer to floor on 3/14. She pulled her trach. Now on oxygen by nasal cannula. She is on Intermitted HD via [place]. Has refused dialysis at times but deemed to lack capacity. Family wants full scope of care including full code and dialysis. She has been noncompliant with care and hemodialysis but lacks capacity. Ethics committee meeting held on 4/6. The plan is to continue full scope of care per family's wish. Finally shetolerated HD in recliner chair on 4/19 without sedating medications. She will be discharged to SNF once she had outpatient HD spot andcontinues to tolerate HD on recliner chair.  She's been started on lactulose for elevated ammonia with concern for hepatic encephalopathy as well as nightly bipap for acute hypercapneic resp failure with altered mental status.    Subjective:  Patient in bed, appears comfortable, denies any headache, no fever, no chest pain or pressure, no shortness of breath , no abdominal pain. No focal  weakness.  Assessment/Plan:  Acute metabolic encephalopathy: Multifactorial etiology-including AKI, hypercarbia, hospital/ICU delirium, hyperammonemia, pneumonia.  Encephalopathy is improved-although waxing and waning at times.  TSH/vitamin B12 within normal limits-RPR nonreactive.  Continue supportive care.  Septic shock secondary to UTI and PNA: Sepsis physiology has resolved-has completed a course of antimicrobial therapy.  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  Continue BiPAP nightly - wean oxygen as able.  Avoid sedating medications like narcotics/benzodiazepines as much as possible.  Pseudomonas aeruginosa/aspiration pneumonia with right-sided parapneumonic effusion: Has completed multiple courses of antibiotics-see below.  Patient is s/p  thoracocentesis by PCCM on 4/22-plans are to continue with Rocephin until 5/1.  Maintain aspiration precautions.  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  Noncompliant with HD but lacks capacity.  Essential hypertension: BP controlled-not on scheduled antihypertensives-remains on as needed metoprolol.    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  Hemoglobin stable-follow periodically.   Anxiety/depression/agitation: Stable this morning-calm-quiet and answered most of my questions appropriately.  Continue Paxil and BuSpar.  Maintain delirium precautions.  Due to lethargy/sedation-no longer on Haldol and Seroquel. Deemed to lack capacity by psych.  GERD: Continue PPI  Thrombocytopenia:  resolved.  Generalized weakness/debility: Secondary to acute illness-Per prior note--not able to participate in therapy. Requiring mechanical lift to chair.  Chronic pain:-Scheduled Tylenol 1000 mg 3 times daily.  No longer onoxycodone and reduced  IV fentanyl due to AMS.  Goals of care/Ethical issues -  complex medical issue with guarded prognosis. Patient refused HD previously but deemed to lack capacity by psych. Daughter wishes her to be full code with full scope of care including dialysis.Continued on HD with the help of medications and family.Palliative care following.Ethics involved previously.  DM-2 with hyperglycemia ( A1c 5.7%): No further hypoglycemic episodes-continue Lantus 8 units and SSI.    Lab Results  Component Value Date   HGBA1C 5.7 (H) 01/06/2020   CBG (last 3)  Recent Labs    03/16/20 0318 03/16/20 0742 03/16/20 1242  GLUCAP 132* 123* 118*     Nutrition Problem: Nutrition Problem: Inadequate oral intake Etiology: poor appetite, acute illness Signs/Symptoms: meal completion < 25% Interventions: Tube feeding, Nepro shake  Obesity: Estimated body mass index is 32.66 kg/m as calculated from the following:   Height as of this encounter: '5\' 4"'$  (1.626 m).   Weight as of this encounter: 86.3 kg.   DVT prophylaxis: SQ heparin  Diet: Diet Order            DIET DYS 2 Room service appropriate? No; Fluid consistency: Thin  Diet effective now               Code Status:  Full code   Family Communication:  Spoke to daughter Katharine Look [phone] on 03/15/2020  Procedures :  3/5 IJ vas cath-> 4/22 right-sided thoracocentesis>>  Significant studies:  Echo 10/24/2019 > LVEF 55%, Grade 1 DD. RV systolic function normal.  Echo 3/2: LVEF 65-70%, grade II dysfunction. RVSP  Consults:  Nephrology PCCM Psychiatry Palliative care Interventional radiology Ethics  Disposition Plan:  SNF when ready for discharge-awaiting bed.  Status is: Inpatient  Remains inpatient appropriate because:Inpatient level of care appropriate due to severity of illness   Dispo: The patient is from: [facility]              Anticipated d/c is to: SNF              Anticipated d/c date is: > 3 days               Patient currently is medically stable to d/c.  Has SNF bed in [place]-but transportation from SNF to HD is still the main barrier for discharge   Antimicrobial agents:  Anti-infectives (From admission, onward)   Start     Dose/Rate Route Frequency Ordered Stop   02/28/20 1500  cefTRIAXone (ROCEPHIN) 2 g in sodium chloride 0.9 % 100 mL IVPB     2 g 200 mL/hr over 30 Minutes Intravenous Every 24 hours 02/28/20 1404 03/05/20 2359   02/26/20 1300  cefTRIAXone (ROCEPHIN) 1 g in sodium chloride 0.9 % 100 mL IVPB  Status:  Discontinued     1 g 200 mL/hr over 30 Minutes Intravenous Daily 02/26/20 1201 02/27/20 0930   02/16/20 1230  cefTRIAXone (ROCEPHIN) 2 g in sodium chloride 0.9 % 100 mL IVPB     2 g 200 mL/hr over 30 Minutes Intravenous Every 24 hours 02/16/20 1127 02/20/20 0941   02/16/20 1230  azithromycin (ZITHROMAX) 500 mg in sodium chloride 0.9 % 250 mL IVPB     500 mg 250 mL/hr over 60 Minutes Intravenous Every 24 hours 02/16/20 1127 02/20/20 1140   01/10/20 0930  vancomycin (VANCOCIN) IVPB 1000 mg/200 mL premix  Status:  Discontinued     1,000 mg 200 mL/hr over 60 Minutes Intravenous  Once 01/10/20 0915 01/13/20 0623   01/06/20 2200  ceFEPIme (MAXIPIME) 2 g in sodium chloride 0.9 % 100 mL  IVPB     2 g 200 mL/hr over 30 Minutes Intravenous Every 24 hours 01/05/20 2251 01/11/20 2234   01/05/20 2200  ceFEPIme (MAXIPIME) 2 g in sodium chloride 0.9 % 100 mL IVPB     2 g 200 mL/hr over 30 Minutes Intravenous  Once 01/05/20 2148 01/06/20 0015   01/05/20 2200  metroNIDAZOLE (FLAGYL) IVPB 500 mg     500 mg 100 mL/hr over 60 Minutes Intravenous  Once 01/05/20 2148 01/06/20 0015   01/05/20 2200  vancomycin (VANCOCIN) IVPB 1000 mg/200 mL premix  Status:  Discontinued     1,000 mg 200 mL/hr over 60 Minutes Intravenous  Once 01/05/20 2148 01/05/20 2151   01/05/20 2200  vancomycin (VANCOREADY) IVPB 2000 mg/400 mL  Status:  Discontinued     2,000 mg 200 mL/hr over 120 Minutes Intravenous   Once 01/05/20 2151 01/05/20 2223       Time spent: 25 minutes-Greater than 50% of this time was spent in counseling, explanation of diagnosis, planning of further management, and coordination of care.  MEDICATIONS: Scheduled Meds: . acetaminophen (TYLENOL) oral liquid 160 mg/5 mL  1,000 mg Per Tube Q8H  . busPIRone  5 mg Per Tube BID  . calcium acetate  667 mg Oral TID WC  . camphor-menthol   Topical QID  . chlorhexidine gluconate (MEDLINE KIT)  15 mL Mouth Rinse BID  . Chlorhexidine Gluconate Cloth  6 each Topical Q0600  . [START ON 03/17/2020] Chlorhexidine Gluconate Cloth  6 each Topical Q0600  . darbepoetin (ARANESP) injection - DIALYSIS  200 mcg Subcutaneous Q Wed-1800  . feeding supplement (NEPRO CARB STEADY)  1,000 mL Per Tube Q24H  . feeding supplement (PRO-STAT SUGAR FREE 64)  30 mL Per Tube TID  . heparin injection (subcutaneous)  5,000 Units Subcutaneous Q8H  . insulin aspart  0-9 Units Subcutaneous Q4H  . insulin glargine  8 Units Subcutaneous QHS  . lactulose  20 g Per Tube BID  . pantoprazole sodium  40 mg Per Tube BID  . PARoxetine  40 mg Per Tube Daily  . sodium chloride flush  10-40 mL Intracatheter Q12H   Continuous Infusions:  PRN Meds:.feeding supplement (NEPRO CARB STEADY), guaiFENesin, metoprolol tartrate, ondansetron (ZOFRAN) IV, phenol, promethazine, sodium chloride flush   PHYSICAL EXAM: Vital signs: Vitals:   03/16/20 0009 03/16/20 0319 03/16/20 0746 03/16/20 1243  BP: (!) 129/55 (!) 148/69 (!) 143/57 (!) 142/58  Pulse: 85 85 85 88  Resp: '19 18 19 18  '$ Temp: 98.6 F (37 C) 98 F (36.7 C) 98 F (36.7 C) 98 F (36.7 C)  TempSrc: Oral Oral Axillary Oral  SpO2: 98% 97% 99% 99%  Weight:  86.3 kg    Height:       Filed Weights   03/14/20 0655 03/15/20 0558 03/16/20 0319  Weight: 86.3 kg 88.5 kg 86.3 kg   Body mass index is 32.66 kg/m.   EXAM  Awake Alert, No new F.N deficits,  PEG in place [place].AT,PERRAL Supple Neck,No JVD, No cervical  lymphadenopathy appriciated.  Symmetrical Chest wall movement, Good air movement bilaterally, CTAB RRR,No Gallops, Rubs or new Murmurs, No Parasternal Heave +ve B.Sounds, Abd Soft, No tenderness, No organomegaly appriciated, No rebound - guarding or rigidity. No Cyanosis, Clubbing or edema, No new Rash or bruise     I have personally reviewed following labs and imaging studies  LABORATORY DATA: CBC: Recent Labs  Lab 03/12/20 0727 03/13/20 1235 03/14/20 0511 03/15/20 0218 03/16/20 0333  WBC 5.1 5.5  6.0 6.3 7.3  NEUTROABS  --   --   --  4.5 5.6  HGB 9.1* 8.9* 9.5* 8.7* 9.3*  HCT 32.3* 32.3* 33.9* 32.2* 34.5*  MCV 97.6 97.9 99.1 97.9 100.0  PLT 174 169 172 165 229    Basic Metabolic Panel: Recent Labs  Lab 03/10/20 0805 03/10/20 0805 03/11/20 0408 03/11/20 0408 03/12/20 0727 03/13/20 1235 03/14/20 0511 03/14/20 1006 03/15/20 0218 03/16/20 0333  NA 135   < > 132*   < > 133* 133* 134*  --  134* 137  K 4.0   < > 3.7   < > 3.6 3.5 3.8  --  3.5 3.4*  CL 94*   < > 98   < > 97* 91* 94*  --  94* 94*  CO2 28   < > 28   < > '25 27 27  '$ --  26 32  GLUCOSE 119*   < > 89   < > 111* 127* 129*  --  58* 118*  BUN 62*   < > 40*   < > 58* 36* 51*  --  71* 42*  CREATININE 1.64*   < > 1.33*   < > 1.69* 1.36* 1.60*  --  1.85* 1.27*  CALCIUM 9.4   < > 8.7*   < > 9.1 9.2 9.7  --  9.8 9.0  MG  --   --   --   --   --   --   --  2.4 2.4 2.3  PHOS 4.1  --  3.4  --  4.3  --  4.7*  --   --   --    < > = values in this interval not displayed.    GFR: Estimated Creatinine Clearance: 43.2 mL/min (A) (by C-G formula based on SCr of 1.27 mg/dL (H)).  Liver Function Tests: Recent Labs  Lab 03/12/20 0727 03/13/20 1235 03/14/20 0511 03/15/20 0218 03/16/20 0333  AST  --  30  --  27 28  ALT  --  19  --  19 20  ALKPHOS  --  201*  --  207* 208*  BILITOT  --  0.1*  --  0.2* 0.3  PROT  --  6.4*  --  6.1* 6.6  ALBUMIN 2.4* 2.5* 2.5* 2.4* 2.5*   No results for input(s): LIPASE, AMYLASE in the  last 168 hours. Recent Labs  Lab 03/13/20 1235 03/14/20 1006 03/15/20 1432 03/16/20 0333  AMMONIA 55* 43* 69* 65*    Coagulation Profile: No results for input(s): INR, PROTIME in the last 168 hours.  Cardiac Enzymes: No results for input(s): CKTOTAL, CKMB, CKMBINDEX, TROPONINI in the last 168 hours.  BNP (last 3 results) No results for input(s): PROBNP in the last 8760 hours.  Lipid Profile: No results for input(s): CHOL, HDL, LDLCALC, TRIG, CHOLHDL, LDLDIRECT in the last 72 hours.  Thyroid Function Tests: No results for input(s): TSH, T4TOTAL, FREET4, T3FREE, THYROIDAB in the last 72 hours.  Anemia Panel: No results for input(s): VITAMINB12, FOLATE, FERRITIN, TIBC, IRON, RETICCTPCT in the last 72 hours.  Urine analysis:    Component Value Date/Time   COLORURINE YELLOW 02/25/2020 1600   APPEARANCEUR TURBID (A) 02/25/2020 1600   LABSPEC 1.021 02/25/2020 1600   PHURINE 5.0 02/25/2020 1600   GLUCOSEU NEGATIVE 02/25/2020 1600   HGBUR MODERATE (A) 02/25/2020 1600   BILIRUBINUR NEGATIVE 02/25/2020 1600   KETONESUR NEGATIVE 02/25/2020 1600   PROTEINUR 100 (A) 02/25/2020 1600   NITRITE NEGATIVE 02/25/2020 1600  LEUKOCYTESUR MODERATE (A) 02/25/2020 1600    Sepsis Labs: Lactic Acid, Venous    Component Value Date/Time   LATICACIDVEN 0.7 02/24/2020 1823    MICROBIOLOGY: Recent Results (from the past 240 hour(s))  SARS CORONAVIRUS 2 (TAT 6-24 HRS) Nasopharyngeal Nasopharyngeal Swab     Status: None   Collection Time: 03/08/20  4:16 PM   Specimen: Nasopharyngeal Swab  Result Value Ref Range Status   SARS Coronavirus 2 NEGATIVE NEGATIVE Final    Comment: (NOTE) SARS-CoV-2 target nucleic acids are NOT DETECTED. The SARS-CoV-2 RNA is generally detectable in upper and lower respiratory specimens during the acute phase of infection. Negative results do not preclude SARS-CoV-2 infection, do not rule out co-infections with other pathogens, and should not be used as  the sole basis for treatment or other patient management decisions. Negative results must be combined with clinical observations, patient history, and epidemiological information. The expected result is Negative. Fact Sheet for Patients: SugarRoll.be Fact Sheet for Healthcare Providers: https://www.woods-mathews.com/ This test is not yet approved or cleared by the [place] FDA and  has been authorized for detection and/or diagnosis of SARS-CoV-2 by FDA under an Emergency Use Authorization (EUA). This EUA will remain  in effect (meaning this test can be used) for the duration of the COVID-19 declaration under Section 56 4(b)(1) of the Act, 21 U.S.C. section 360bbb-3(b)(1), unless the authorization is terminated or revoked sooner. Performed at [facility], [place] [address], [place], [place] [address]     RADIOLOGY STUDIES/RESULTS: No results found.   LOS: 71 days   Signature  Lala Lund M.D on 03/16/2020 at 1:52 PM   -  To page go to [email or web address]

## 2020-03-17 LAB — GLUCOSE, CAPILLARY
Glucose-Capillary: 100 mg/dL — ABNORMAL HIGH (ref 70–99)
Glucose-Capillary: 113 mg/dL — ABNORMAL HIGH (ref 70–99)
Glucose-Capillary: 117 mg/dL — ABNORMAL HIGH (ref 70–99)
Glucose-Capillary: 122 mg/dL — ABNORMAL HIGH (ref 70–99)
Glucose-Capillary: 131 mg/dL — ABNORMAL HIGH (ref 70–99)
Glucose-Capillary: 134 mg/dL — ABNORMAL HIGH (ref 70–99)

## 2020-03-17 LAB — CBC WITH DIFFERENTIAL/PLATELET
Abs Immature Granulocytes: 0.03 10*3/uL (ref 0.00–0.07)
Basophils Absolute: 0 10*3/uL (ref 0.0–0.1)
Basophils Relative: 1 %
Eosinophils Absolute: 0.1 10*3/uL (ref 0.0–0.5)
Eosinophils Relative: 2 %
HCT: 32.2 % — ABNORMAL LOW (ref 36.0–46.0)
Hemoglobin: 8.8 g/dL — ABNORMAL LOW (ref 12.0–15.0)
Immature Granulocytes: 1 %
Lymphocytes Relative: 10 %
Lymphs Abs: 0.6 10*3/uL — ABNORMAL LOW (ref 0.7–4.0)
MCH: 27.3 pg (ref 26.0–34.0)
MCHC: 27.3 g/dL — ABNORMAL LOW (ref 30.0–36.0)
MCV: 100 fL (ref 80.0–100.0)
Monocytes Absolute: 0.6 10*3/uL (ref 0.1–1.0)
Monocytes Relative: 10 %
Neutro Abs: 4.7 10*3/uL (ref 1.7–7.7)
Neutrophils Relative %: 76 %
Platelets: 148 10*3/uL — ABNORMAL LOW (ref 150–400)
RBC: 3.22 MIL/uL — ABNORMAL LOW (ref 3.87–5.11)
RDW: 17.4 % — ABNORMAL HIGH (ref 11.5–15.5)
WBC: 6.1 10*3/uL (ref 4.0–10.5)
nRBC: 0 % (ref 0.0–0.2)

## 2020-03-17 LAB — COMPREHENSIVE METABOLIC PANEL
ALT: 20 U/L (ref 0–44)
AST: 27 U/L (ref 15–41)
Albumin: 2.3 g/dL — ABNORMAL LOW (ref 3.5–5.0)
Alkaline Phosphatase: 192 U/L — ABNORMAL HIGH (ref 38–126)
Anion gap: 14 (ref 5–15)
BUN: 62 mg/dL — ABNORMAL HIGH (ref 8–23)
CO2: 28 mmol/L (ref 22–32)
Calcium: 9.6 mg/dL (ref 8.9–10.3)
Chloride: 93 mmol/L — ABNORMAL LOW (ref 98–111)
Creatinine, Ser: 1.75 mg/dL — ABNORMAL HIGH (ref 0.44–1.00)
GFR calc Af Amer: 33 mL/min — ABNORMAL LOW (ref >60)
GFR calc non Af Amer: 29 mL/min — ABNORMAL LOW (ref >60)
Glucose, Bld: 125 mg/dL — ABNORMAL HIGH (ref 70–99)
Potassium: 3.8 mmol/L (ref 3.5–5.1)
Sodium: 135 mmol/L (ref 135–145)
Total Bilirubin: 0.5 mg/dL (ref 0.3–1.2)
Total Protein: 6.3 g/dL — ABNORMAL LOW (ref 6.5–8.1)

## 2020-03-17 LAB — BRAIN NATRIURETIC PEPTIDE: B Natriuretic Peptide: 1472.4 pg/mL — ABNORMAL HIGH (ref 0.0–100.0)

## 2020-03-17 LAB — AMMONIA: Ammonia: 47 umol/L — ABNORMAL HIGH (ref 9–35)

## 2020-03-17 LAB — MAGNESIUM: Magnesium: 2.5 mg/dL — ABNORMAL HIGH (ref 1.7–2.4)

## 2020-03-17 MED ORDER — ALUM & MAG HYDROXIDE-SIMETH 200-200-20 MG/5ML PO SUSP: 30.0000 mL | Freq: Four times a day (QID) | ORAL | Status: DC | PRN

## 2020-03-17 NOTE — Progress Notes (Signed)
Patient had an uneventful shift. She received tube feeds via a G-tube without issues. Mouth care was performed as ordered. Reported off to receiving RN, Development worker, international aid.

## 2020-03-17 NOTE — Progress Notes (Signed)
PROGRESS NOTE        PATIENT DETAILS Name: Bailey Cox Age: 74 y.o. Sex: female Date of Birth: 1946-04-27 Admit Date: 01/05/2020 Admitting Physician Kandis Cocking, MD HRC:BULAGTX, No Pcp Per  Brief Narrative: 74 year old female with history of cholecystitis status post cholecystectomy in 9/20 at [facility]. She was discharged to SNF but rehospitalized to [facility] and had bile duct stent. Discharged back to SNF but readmitted with Ogilive syndrome. Hospital course complicated by fluid overload and transferred to [facility] for dialysis. At [facility], developed respiratory failure requiring vent support. She also suffered a cardiac arrest during EGD. She underwent trach,she was eventually discharged to [facility] on 3/1 after prolonged hospitalization at [facility] but deemed too sick, and sent to [facility] due to fluid overload and acidosis.  Patient was initially admitted to ICU. Required CRRT 3/9-3/12. Transfer to floor on 3/14. She pulled her trach. Now on oxygen by nasal cannula. She is on Intermitted HD via [place]. Has refused dialysis at times but deemed to lack capacity. Family wants full scope of care including full code and dialysis. She has been noncompliant with care and hemodialysis but lacks capacity. Ethics committee meeting held on 4/6. The plan is to continue full scope of care per family's wish. Finally shetolerated HD in recliner chair on 4/19 without sedating medications. She will be discharged to SNF once she had outpatient HD spot andcontinues to tolerate HD on recliner chair.  She's been started on lactulose for elevated ammonia with concern for hepatic encephalopathy as well as nightly bipap for acute hypercapneic resp failure with altered mental status.    Subjective:  Patient in bed, appears comfortable, denies any headache, no fever, no chest pain or pressure, no shortness of breath , no abdominal pain. No focal weakness.   She has mild heartburn.  Assessment/Plan:  Acute metabolic encephalopathy: Multifactorial etiology-including AKI, hypercarbia, hospital/ICU delirium, hyperammonemia, pneumonia.  Encephalopathy is improved-although waxing and waning at times.  TSH/vitamin B12 within normal limits-RPR nonreactive.  Continue supportive care.  Septic shock secondary to UTI and PNA: Sepsis physiology has resolved-has completed a course of antimicrobial therapy.  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  Continue BiPAP nightly - wean oxygen as able.  Avoid sedating medications like narcotics/benzodiazepines as much as possible.  Pseudomonas aeruginosa/aspiration pneumonia with right-sided parapneumonic effusion: Has completed multiple courses of antibiotics-see below.  Patient is s/p  thoracocentesis by PCCM on 4/22-plans are to continue with Rocephin until 5/1.  Maintain aspiration precautions.  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  Noncompliant with HD but lacks capacity.  Essential hypertension: BP controlled-not on scheduled antihypertensives-remains on as needed metoprolol.    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  Hemoglobin stable-follow periodically.   Anxiety/depression/agitation: Stable this morning-calm-quiet and answered most of my questions appropriately.  Continue Paxil and BuSpar.  Maintain delirium precautions.  Due to lethargy/sedation-no longer on Haldol and Seroquel. Deemed to lack capacity by psych.  GERD: Continue PPI, single dose Maalox given on 03/17/2020 for heartburn.  Generalized weakness/debility: Secondary to acute illness-Per prior note--not able to participate in therapy. Requiring mechanical lift to chair.  Chronic pain:-Scheduled Tylenol 1000 mg 3 times daily.   No longer onoxycodone and reduced IV fentanyl  due to AMS.  Goals of care/Ethical issues - complex medical issue with guarded prognosis. Patient refused HD previously but deemed to lack capacity by psych. Daughter wishes her to be full code with full scope of care including dialysis.Continued on HD with the help of medications and family.Palliative care following.Ethics involved previously.  Thrombocytopenia: mild - monitor.  Lab Results  Component Value Date   PLT 148 (L) 03/17/2020   DM-2 with hyperglycemia ( A1c 5.7%): No further hypoglycemic episodes-continue Lantus 8 units and SSI.    Lab Results  Component Value Date   HGBA1C 5.7 (H) 01/06/2020   CBG (last 3)  Recent Labs    03/17/20 0038 03/17/20 0345 03/17/20 0749  GLUCAP 134* 122* 117*     Nutrition Problem: Nutrition Problem: Inadequate oral intake Etiology: poor appetite, acute illness Signs/Symptoms: meal completion < 25% Interventions: Tube feeding, Nepro shake  Obesity: Estimated body mass index is 32.66 kg/m as calculated from the following:   Height as of this encounter: '5\' 4"'$  (1.626 m).   Weight as of this encounter: 86.3 kg.   DVT prophylaxis: SQ heparin  Diet: Diet Order            DIET DYS 2 Room service appropriate? No; Fluid consistency: Thin  Diet effective now               Code Status:  Full code   Family Communication:  Spoke to daughter Katharine Look [phone] on 03/15/2020  Procedures :  3/5 IJ vas cath-> 4/22 right-sided thoracocentesis>>  Significant studies:  Echo 10/24/2019 > LVEF 55%, Grade 1 DD. RV systolic function normal.  Echo 3/2: LVEF 65-70%, grade II dysfunction. RVSP  Consults:  Nephrology PCCM Psychiatry Palliative care Interventional radiology Ethics  Disposition Plan:  SNF when ready for discharge-awaiting bed.  Status is: Inpatient  Remains inpatient appropriate because:Inpatient level of care appropriate due to severity of illness   Dispo:  The patient is from: [facility]              Anticipated d/c is to: SNF              Anticipated d/c date is: > 3 days              Patient currently is medically stable to d/c.  Has SNF bed in [place]-but transportation from SNF to HD is still the main barrier for discharge   Antimicrobial agents:  Anti-infectives (From admission, onward)   Start     Dose/Rate Route Frequency Ordered Stop   02/28/20 1500  cefTRIAXone (ROCEPHIN) 2 g in sodium chloride 0.9 % 100 mL IVPB     2 g 200 mL/hr over 30 Minutes Intravenous Every 24 hours 02/28/20 1404 03/05/20 2359   02/26/20 1300  cefTRIAXone (ROCEPHIN) 1 g in sodium chloride 0.9 % 100 mL IVPB  Status:  Discontinued     1 g 200 mL/hr over 30 Minutes Intravenous Daily 02/26/20 1201 02/27/20 0930   02/16/20 1230  cefTRIAXone (ROCEPHIN) 2 g in sodium chloride 0.9 % 100 mL IVPB     2 g 200 mL/hr over 30 Minutes Intravenous Every 24 hours 02/16/20 1127 02/20/20 0941   02/16/20 1230  azithromycin (ZITHROMAX) 500 mg in sodium chloride 0.9 % 250 mL IVPB     500 mg 250 mL/hr over 60 Minutes Intravenous Every 24 hours 02/16/20 1127 02/20/20 1140   01/10/20 0930  vancomycin (VANCOCIN) IVPB 1000 mg/200 mL premix  Status:  Discontinued     1,000 mg  200 mL/hr over 60 Minutes Intravenous  Once 01/10/20 0915 01/13/20 0623   01/06/20 2200  ceFEPIme (MAXIPIME) 2 g in sodium chloride 0.9 % 100 mL IVPB     2 g 200 mL/hr over 30 Minutes Intravenous Every 24 hours 01/05/20 2251 01/11/20 2234   01/05/20 2200  ceFEPIme (MAXIPIME) 2 g in sodium chloride 0.9 % 100 mL IVPB     2 g 200 mL/hr over 30 Minutes Intravenous  Once 01/05/20 2148 01/06/20 0015   01/05/20 2200  metroNIDAZOLE (FLAGYL) IVPB 500 mg     500 mg 100 mL/hr over 60 Minutes Intravenous  Once 01/05/20 2148 01/06/20 0015   01/05/20 2200  vancomycin (VANCOCIN) IVPB 1000 mg/200 mL premix  Status:  Discontinued     1,000 mg 200 mL/hr over 60 Minutes Intravenous  Once 01/05/20 2148 01/05/20 2151    01/05/20 2200  vancomycin (VANCOREADY) IVPB 2000 mg/400 mL  Status:  Discontinued     2,000 mg 200 mL/hr over 120 Minutes Intravenous  Once 01/05/20 2151 01/05/20 2223       Time spent: 25 minutes-Greater than 50% of this time was spent in counseling, explanation of diagnosis, planning of further management, and coordination of care.  MEDICATIONS: Scheduled Meds: . acetaminophen (TYLENOL) oral liquid 160 mg/5 mL  1,000 mg Per Tube Q8H  . busPIRone  5 mg Per Tube BID  . calcium acetate  667 mg Oral TID WC  . camphor-menthol   Topical QID  . chlorhexidine gluconate (MEDLINE KIT)  15 mL Mouth Rinse BID  . Chlorhexidine Gluconate Cloth  6 each Topical Q0600  . Chlorhexidine Gluconate Cloth  6 each Topical Q0600  . darbepoetin (ARANESP) injection - DIALYSIS  200 mcg Subcutaneous Q Wed-1800  . feeding supplement (NEPRO CARB STEADY)  1,000 mL Per Tube Q24H  . feeding supplement (PRO-STAT SUGAR FREE 64)  30 mL Per Tube TID  . heparin injection (subcutaneous)  5,000 Units Subcutaneous Q8H  . insulin aspart  0-9 Units Subcutaneous Q4H  . insulin glargine  8 Units Subcutaneous QHS  . lactulose  20 g Per Tube BID  . pantoprazole sodium  40 mg Per Tube BID  . PARoxetine  40 mg Per Tube Daily  . sodium chloride flush  10-40 mL Intracatheter Q12H   Continuous Infusions:  PRN Meds:.alum & mag hydroxide-simeth, feeding supplement (NEPRO CARB STEADY), guaiFENesin, heparin, metoprolol tartrate, ondansetron (ZOFRAN) IV, phenol, promethazine, sodium chloride flush   PHYSICAL EXAM: Vital signs: Vitals:   03/17/20 0000 03/17/20 0340 03/17/20 0400 03/17/20 0747  BP: (!) 136/47 (!) 161/56 (!) 158/53 (!) 167/57  Pulse: 83 82 81 81  Resp: '16 17  20  '$ Temp: 97.6 F (36.4 C) 97.8 F (36.6 C)  97.8 F (36.6 C)  TempSrc: Axillary Axillary  Axillary  SpO2: 100% 100%  98%  Weight:      Height:       Filed Weights   03/14/20 0655 03/15/20 0558 03/16/20 0319  Weight: 86.3 kg 88.5 kg 86.3 kg   Body  mass index is 32.66 kg/m.   EXAM  Awake Alert, No new F.N deficits,  PEG in place [place].AT,PERRAL Supple Neck,No JVD, No cervical lymphadenopathy appriciated.  Symmetrical Chest wall movement, Good air movement bilaterally, CTAB RRR,No Gallops, Rubs or new Murmurs, No Parasternal Heave +ve B.Sounds, Abd Soft, No tenderness, No organomegaly appriciated, No rebound - guarding or rigidity. No Cyanosis, Clubbing or edema, No new Rash or bruise     I have personally reviewed following  labs and imaging studies  LABORATORY DATA: CBC: Recent Labs  Lab 03/14/20 0511 03/15/20 0218 03/16/20 0333 03/16/20 2036 03/17/20 0359  WBC 6.0 6.3 7.3 6.3 6.1  NEUTROABS  --  4.5 5.6  --  4.7  HGB 9.5* 8.7* 9.3* 8.7* 8.8*  HCT 33.9* 32.2* 34.5* 32.3* 32.2*  MCV 99.1 97.9 100.0 99.1 100.0  PLT 172 165 182 149* 148*    Basic Metabolic Panel: Recent Labs  Lab 03/11/20 0408 03/11/20 0408 03/12/20 0727 03/13/20 1235 03/14/20 0511 03/14/20 1006 03/15/20 0218 03/16/20 0333 03/16/20 2036 03/17/20 0359  NA 132*   < > 133*   < > 134*  --  134* 137 133* 135  K 3.7   < > 3.6   < > 3.8  --  3.5 3.4* 3.5 3.8  CL 98   < > 97*   < > 94*  --  94* 94* 94* 93*  CO2 28   < > 25   < > 27  --  26 32 30 28  GLUCOSE 89   < > 111*   < > 129*  --  58* 118* 121* 125*  BUN 40*   < > 58*   < > 51*  --  71* 42* 54* 62*  CREATININE 1.33*   < > 1.69*   < > 1.60*  --  1.85* 1.27* 1.63* 1.75*  CALCIUM 8.7*   < > 9.1   < > 9.7  --  9.8 9.0 9.1 9.6  MG  --   --   --   --   --  2.4 2.4 2.3  --  2.5*  PHOS 3.4  --  4.3  --  4.7*  --   --   --  4.0  --    < > = values in this interval not displayed.    GFR: Estimated Creatinine Clearance: 31.3 mL/min (A) (by C-G formula based on SCr of 1.75 mg/dL (H)).  Liver Function Tests: Recent Labs  Lab 03/13/20 1235 03/13/20 1235 03/14/20 0511 03/15/20 0218 03/16/20 0333 03/16/20 2036 03/17/20 0359  AST 30  --   --  27 28  --  27  ALT 19  --   --  19 20  --  20    ALKPHOS 201*  --   --  207* 208*  --  192*  BILITOT 0.1*  --   --  0.2* 0.3  --  0.5  PROT 6.4*  --   --  6.1* 6.6  --  6.3*  ALBUMIN 2.5*   < > 2.5* 2.4* 2.5* 2.3* 2.3*   < > = values in this interval not displayed.   No results for input(s): LIPASE, AMYLASE in the last 168 hours. Recent Labs  Lab 03/13/20 1235 03/14/20 1006 03/15/20 1432 03/16/20 0333 03/17/20 0359  AMMONIA 55* 43* 69* 65* 47*    Coagulation Profile: No results for input(s): INR, PROTIME in the last 168 hours.  Cardiac Enzymes: No results for input(s): CKTOTAL, CKMB, CKMBINDEX, TROPONINI in the last 168 hours.  BNP (last 3 results) No results for input(s): PROBNP in the last 8760 hours.  Lipid Profile: No results for input(s): CHOL, HDL, LDLCALC, TRIG, CHOLHDL, LDLDIRECT in the last 72 hours.  Thyroid Function Tests: No results for input(s): TSH, T4TOTAL, FREET4, T3FREE, THYROIDAB in the last 72 hours.  Anemia Panel: No results for input(s): VITAMINB12, FOLATE, FERRITIN, TIBC, IRON, RETICCTPCT in the last 72 hours.  Urine analysis:  Component Value Date/Time   COLORURINE YELLOW 02/25/2020 1600   APPEARANCEUR TURBID (A) 02/25/2020 1600   LABSPEC 1.021 02/25/2020 1600   PHURINE 5.0 02/25/2020 1600   GLUCOSEU NEGATIVE 02/25/2020 1600   HGBUR MODERATE (A) 02/25/2020 1600   BILIRUBINUR NEGATIVE 02/25/2020 1600   KETONESUR NEGATIVE 02/25/2020 1600   PROTEINUR 100 (A) 02/25/2020 1600   NITRITE NEGATIVE 02/25/2020 1600   LEUKOCYTESUR MODERATE (A) 02/25/2020 1600    Sepsis Labs: Lactic Acid, Venous    Component Value Date/Time   LATICACIDVEN 0.7 02/24/2020 1823    MICROBIOLOGY: Recent Results (from the past 240 hour(s))  SARS CORONAVIRUS 2 (TAT 6-24 HRS) Nasopharyngeal Nasopharyngeal Swab     Status: None   Collection Time: 03/08/20  4:16 PM   Specimen: Nasopharyngeal Swab  Result Value Ref Range Status   SARS Coronavirus 2 NEGATIVE NEGATIVE Final    Comment: (NOTE) SARS-CoV-2 target  nucleic acids are NOT DETECTED. The SARS-CoV-2 RNA is generally detectable in upper and lower respiratory specimens during the acute phase of infection. Negative results do not preclude SARS-CoV-2 infection, do not rule out co-infections with other pathogens, and should not be used as the sole basis for treatment or other patient management decisions. Negative results must be combined with clinical observations, patient history, and epidemiological information. The expected result is Negative. Fact Sheet for Patients: SugarRoll.be Fact Sheet for Healthcare Providers: https://www.woods-mathews.com/ This test is not yet approved or cleared by the [place] FDA and  has been authorized for detection and/or diagnosis of SARS-CoV-2 by FDA under an Emergency Use Authorization (EUA). This EUA will remain  in effect (meaning this test can be used) for the duration of the COVID-19 declaration under Section 56 4(b)(1) of the Act, 21 U.S.C. section 360bbb-3(b)(1), unless the authorization is terminated or revoked sooner. Performed at [facility], [place] [address] [place], [place] [address]     RADIOLOGY STUDIES/RESULTS: No results found.   LOS: 72 days   Signature  Lala Lund M.D on 03/17/2020 at 9:21 AM   -  To page go to [email or web address]

## 2020-03-17 NOTE — TOC Progression Note (Addendum)
Transition of Care [facility]) - Progression Note    Patient Details  Name: Bailey Cox MRN: [id number] Date of Birth: Sep 15, 1946  Transition of Care [facility]) CM/SW [place], LCSW Phone Number: 03/17/2020, 3:00 PM  Clinical Narrative:    3pm-Case discussed at [facility] meeting. Physician advisor to contact [employer] and [place].  5pm-[facility] [place] liaison called CSW back and requested referral be sent. CSW updated patient's daughter.    Expected Discharge Plan: Skilled Nursing Facility Barriers to Discharge: Continued Medical Work up, Other (comment)(ability to tolerate dialysis in recliner)  Expected Discharge Plan and Services Expected Discharge Plan: [place] In-house Referral: Clinical Social Work Discharge Planning Services: CM Consult Post Acute Care Choice: [place] Living arrangements for the past 2 months: (Has been hospitalized for over 4 months)                                       Social Determinants of Health (SDOH) Interventions    Readmission Risk Interventions Readmission Risk Prevention Plan 01/28/2020  Transportation Screening Complete  PCP or Specialist Appt within 3-5 Days Not Complete  Not Complete comments plan for SNF  HRI or [place] Not Complete  HRI or Home Care Consult comments plan for SNF  Social Work Consult for [place] Planning/Counseling Complete  Palliative Care Screening Complete  Medication Review [employer] photographer) Referral to Pharmacy  PCP or Specialist appointment within 3-5 days of discharge Not Complete  PCP/Specialist Appt Not Complete comments plan for SNF  HRI or Home Care Consult Complete  SW Recovery Care/Counseling Consult Complete  Palliative Care Screening Not Complete  Comments appropriate at this time  [place] Complete

## 2020-03-17 NOTE — Progress Notes (Signed)
Patient ID: Bailey Cox, female   DOB: 27-Jul-1946, 74 y.o.   MRN: [id number]   S: Pt w/o complaints today.    O:BP (!) 179/71 (BP Location: Right Arm)   Pulse 87   Temp 97.9 F (36.6 C) (Axillary)   Resp 19   Ht '5\' 4"'$  (1.626 m)   Wt 86.3 kg   LMP  (LMP Unknown)   SpO2 94%   BMI 32.66 kg/m   Intake/Output Summary (Last 24 hours) at 03/17/2020 1408 Last data filed at 03/17/2020 0951 Gross per 24 hour  Intake 700 ml  Output --  Net 700 ml   Intake/Output: I/O last 3 completed shifts: In: 690 [I.V.:210; NG/GT:480] Out: -   Intake/Output this shift:  Total I/O In: 60 [P.O.:60] Out: -  Weight change:  Gen: NAD CVS: no rub Resp: cta Abd: +BS, soft, NT/ND Ext: trace pretibial edema  Recent Labs  Lab 03/11/20 0408 03/11/20 0408 03/12/20 0727 03/13/20 1235 03/14/20 0511 03/15/20 0218 03/16/20 0333 03/16/20 2036 03/17/20 0359  NA 132*   < > 133* 133* 134* 134* 137 133* 135  K 3.7   < > 3.6 3.5 3.8 3.5 3.4* 3.5 3.8  CL 98   < > 97* 91* 94* 94* 94* 94* 93*  CO2 28   < > '25 27 27 26 '$ 32 30 28  GLUCOSE 89   < > 111* 127* 129* 58* 118* 121* 125*  BUN 40*   < > 58* 36* 51* 71* 42* 54* 62*  CREATININE 1.33*   < > 1.69* 1.36* 1.60* 1.85* 1.27* 1.63* 1.75*  ALBUMIN 2.5*   < > 2.4* 2.5* 2.5* 2.4* 2.5* 2.3* 2.3*  CALCIUM 8.7*   < > 9.1 9.2 9.7 9.8 9.0 9.1 9.6  PHOS 3.4  --  4.3  --  4.7*  --   --  4.0  --   AST  --   --   --  30  --  27 28  --  27  ALT  --   --   --  19  --  19 20  --  20   < > = values in this interval not displayed.   Liver Function Tests: Recent Labs  Lab 03/15/20 0218 03/15/20 0218 03/16/20 0333 03/16/20 2036 03/17/20 0359  AST 27  --  28  --  27  ALT 19  --  20  --  20  ALKPHOS 207*  --  208*  --  192*  BILITOT 0.2*  --  0.3  --  0.5  PROT 6.1*  --  6.6  --  6.3*  ALBUMIN 2.4*   < > 2.5* 2.3* 2.3*   < > = values in this interval not displayed.   No results for input(s): LIPASE, AMYLASE in the last 168 hours. Recent Labs  Lab  03/15/20 1432 03/16/20 0333 03/17/20 0359  AMMONIA 69* 65* 47*   CBC: Recent Labs  Lab 03/14/20 0511 03/14/20 0511 03/15/20 0218 03/15/20 0218 03/16/20 0333 03/16/20 2036 03/17/20 0359  WBC 6.0   < > 6.3   < > 7.3 6.3 6.1  NEUTROABS  --   --  4.5  --  5.6  --  4.7  HGB 9.5*   < > 8.7*   < > 9.3* 8.7* 8.8*  HCT 33.9*   < > 32.2*   < > 34.5* 32.3* 32.2*  MCV 99.1  --  97.9  --  100.0 99.1 100.0  PLT 172   < >  165   < > 182 149* 148*   < > = values in this interval not displayed.   Cardiac Enzymes: No results for input(s): CKTOTAL, CKMB, CKMBINDEX, TROPONINI in the last 168 hours. CBG: Recent Labs  Lab 03/16/20 2029 03/17/20 0038 03/17/20 0345 03/17/20 0749 03/17/20 1234  GLUCAP 112* 134* 122* 117* 113*    Iron Studies: No results for input(s): IRON, TIBC, TRANSFERRIN, FERRITIN in the last 72 hours. Studies/Results: No results found. Marland Kitchen acetaminophen (TYLENOL) oral liquid 160 mg/5 mL  1,000 mg Per Tube Q8H  . busPIRone  5 mg Per Tube BID  . calcium acetate  667 mg Oral TID WC  . camphor-menthol   Topical QID  . chlorhexidine gluconate (MEDLINE KIT)  15 mL Mouth Rinse BID  . Chlorhexidine Gluconate Cloth  6 each Topical Q0600  . Chlorhexidine Gluconate Cloth  6 each Topical Q0600  . darbepoetin (ARANESP) injection - DIALYSIS  200 mcg Subcutaneous Q Wed-1800  . feeding supplement (NEPRO CARB STEADY)  1,000 mL Per Tube Q24H  . feeding supplement (PRO-STAT SUGAR FREE 64)  30 mL Per Tube TID  . heparin injection (subcutaneous)  5,000 Units Subcutaneous Q8H  . insulin aspart  0-9 Units Subcutaneous Q4H  . insulin glargine  8 Units Subcutaneous QHS  . lactulose  20 g Per Tube BID  . pantoprazole sodium  40 mg Per Tube BID  . PARoxetine  40 mg Per Tube Daily  . sodium chloride flush  10-40 mL Intracatheter Q12H    BMET    Component Value Date/Time   NA 135 03/17/2020 0359   K 3.8 03/17/2020 0359   CL 93 (L) 03/17/2020 0359   CO2 28 03/17/2020 0359   GLUCOSE 125  (H) 03/17/2020 0359   BUN 62 (H) 03/17/2020 0359   CREATININE 1.75 (H) 03/17/2020 0359   CALCIUM 9.6 03/17/2020 0359   GFRNONAA 29 (L) 03/17/2020 0359   GFRAA 33 (L) 03/17/2020 0359   CBC    Component Value Date/Time   WBC 6.1 03/17/2020 0359   RBC 3.22 (L) 03/17/2020 0359   HGB 8.8 (L) 03/17/2020 0359   HCT 32.2 (L) 03/17/2020 0359   PLT 148 (L) 03/17/2020 0359   MCV 100.0 03/17/2020 0359   MCH 27.3 03/17/2020 0359   MCHC 27.3 (L) 03/17/2020 0359   RDW 17.4 (H) 03/17/2020 0359   LYMPHSABS 0.6 (L) 03/17/2020 0359   MONOABS 0.6 03/17/2020 0359   EOSABS 0.1 03/17/2020 0359   BASOSABS 0.0 03/17/2020 0359    Assessment/Plan:  1. New ESRD in setting of OliguricAKI/CKD stage 3- due to ischemic ATN in setting of sepsis. She has remained dialysis dependent since 01/09/20 and will require ongoing IHD. Now on MWF schedule for outpatient HD at [facility] [place].   1. Using [facility] 2. Tolerating HD in recliner 3. Remains oliguric with rapidly rising BUN. Scr rising but less than 2 likely due to lack of muscle mass and malnutrition. 4. Continue with MWF schedule- will be delayed until 5/13 2/2 high HD volume and pt is overall stable 2. AMS- continues to wax and wane.  Started on lactulose for elevated ammonia and bipap nightly for acute hypercapneic resp failure 3. Anemia of CKD- on high dose ESA, stable Hb 4. H/o PEA arrest 5. HFpEF- mild volume overload. Will UF as tolerated  6. DM- per primary 7. 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 Was on trach but decannulated on 01/23/20. Continue with BiPAP nightly. 8.  Bones-  Phos is good on phoslo-  PTH 59 at last check - no meds 9. Disposition- for discharge to SNF and arranged for outpatient HD in [place], [place], awaiting transportation issues to be resolved from SNF to HD Unit.  Rexene Agent [employer]

## 2020-03-18 ENCOUNTER — Encounter (HOSPITAL_COMMUNITY): Payer: Self-pay | Admitting: Internal Medicine

## 2020-03-18 LAB — AMMONIA: Ammonia: 52 umol/L — ABNORMAL HIGH (ref 9–35)

## 2020-03-18 LAB — CBC WITH DIFFERENTIAL/PLATELET
Abs Immature Granulocytes: 0.02 10*3/uL (ref 0.00–0.07)
Basophils Absolute: 0.1 10*3/uL (ref 0.0–0.1)
Basophils Relative: 1 %
Eosinophils Absolute: 0.1 10*3/uL (ref 0.0–0.5)
Eosinophils Relative: 2 %
HCT: 31.3 % — ABNORMAL LOW (ref 36.0–46.0)
Hemoglobin: 8.5 g/dL — ABNORMAL LOW (ref 12.0–15.0)
Immature Granulocytes: 0 %
Lymphocytes Relative: 12 %
Lymphs Abs: 0.6 10*3/uL — ABNORMAL LOW (ref 0.7–4.0)
MCH: 26.8 pg (ref 26.0–34.0)
MCHC: 27.2 g/dL — ABNORMAL LOW (ref 30.0–36.0)
MCV: 98.7 fL (ref 80.0–100.0)
Monocytes Absolute: 0.5 10*3/uL (ref 0.1–1.0)
Monocytes Relative: 9 %
Neutro Abs: 4.1 10*3/uL (ref 1.7–7.7)
Neutrophils Relative %: 76 %
Platelets: 148 10*3/uL — ABNORMAL LOW (ref 150–400)
RBC: 3.17 MIL/uL — ABNORMAL LOW (ref 3.87–5.11)
RDW: 17.1 % — ABNORMAL HIGH (ref 11.5–15.5)
WBC: 5.4 10*3/uL (ref 4.0–10.5)
nRBC: 0 % (ref 0.0–0.2)

## 2020-03-18 LAB — COMPREHENSIVE METABOLIC PANEL
ALT: 18 U/L (ref 0–44)
AST: 23 U/L (ref 15–41)
Albumin: 2.3 g/dL — ABNORMAL LOW (ref 3.5–5.0)
Alkaline Phosphatase: 187 U/L — ABNORMAL HIGH (ref 38–126)
Anion gap: 11 (ref 5–15)
BUN: 83 mg/dL — ABNORMAL HIGH (ref 8–23)
CO2: 29 mmol/L (ref 22–32)
Calcium: 10 mg/dL (ref 8.9–10.3)
Chloride: 93 mmol/L — ABNORMAL LOW (ref 98–111)
Creatinine, Ser: 2.26 mg/dL — ABNORMAL HIGH (ref 0.44–1.00)
GFR calc Af Amer: 24 mL/min — ABNORMAL LOW (ref >60)
GFR calc non Af Amer: 21 mL/min — ABNORMAL LOW (ref >60)
Glucose, Bld: 130 mg/dL — ABNORMAL HIGH (ref 70–99)
Potassium: 3.6 mmol/L (ref 3.5–5.1)
Sodium: 133 mmol/L — ABNORMAL LOW (ref 135–145)
Total Bilirubin: 0.5 mg/dL (ref 0.3–1.2)
Total Protein: 5.9 g/dL — ABNORMAL LOW (ref 6.5–8.1)

## 2020-03-18 LAB — MAGNESIUM: Magnesium: 2.6 mg/dL — ABNORMAL HIGH (ref 1.7–2.4)

## 2020-03-18 LAB — GLUCOSE, CAPILLARY
Glucose-Capillary: 106 mg/dL — ABNORMAL HIGH (ref 70–99)
Glucose-Capillary: 117 mg/dL — ABNORMAL HIGH (ref 70–99)
Glucose-Capillary: 129 mg/dL — ABNORMAL HIGH (ref 70–99)
Glucose-Capillary: 143 mg/dL — ABNORMAL HIGH (ref 70–99)
Glucose-Capillary: 90 mg/dL (ref 70–99)

## 2020-03-18 LAB — BRAIN NATRIURETIC PEPTIDE: B Natriuretic Peptide: 1688.7 pg/mL — ABNORMAL HIGH (ref 0.0–100.0)

## 2020-03-18 MED ORDER — HEPARIN SODIUM (PORCINE) 1000 UNIT/ML IJ SOLN
INTRAMUSCULAR | Status: AC
  Filled 2020-03-18: qty 4

## 2020-03-18 MED ORDER — LACTULOSE 10 GM/15ML PO SOLN
30.0000 g | Freq: Two times a day (BID) | ORAL | Status: DC
  Administered 2020-03-18 – 2020-03-21 (×7): 30 g
  Filled 2020-03-18 (×7): qty 45

## 2020-03-18 NOTE — Progress Notes (Signed)
PROGRESS NOTE        PATIENT DETAILS Name: Bailey Cox Age: 74 y.o. Sex: female Date of Birth: Aug 03, 1946 Admit Date: 01/05/2020 Admitting Physician Kandis Cocking, MD VOZ:[id number], No Pcp Per  Brief Narrative: 74 year old female with history of cholecystitis status post cholecystectomy in 9/20 at [facility]. She was discharged to SNF but rehospitalized to [facility] and had bile duct stent. Discharged back to SNF but readmitted with Ogilive syndrome. Hospital course complicated by fluid overload and transferred to [facility] for dialysis. At [facility], developed respiratory failure requiring vent support. She also suffered a cardiac arrest during EGD. She underwent trach,she was eventually discharged to [facility] on 3/1 after prolonged hospitalization at [facility] but deemed too sick, and sent to [facility] due to fluid overload and acidosis.  Patient was initially admitted to ICU. Required CRRT 3/9-3/12. Transfer to floor on 3/14. She pulled her trach. Now on oxygen by nasal cannula. She is on Intermitted HD via [place]. Has refused dialysis at times but deemed to lack capacity. Family wants full scope of care including full code and dialysis. She has been noncompliant with care and hemodialysis but lacks capacity. Ethics committee meeting held on 4/6. The plan is to continue full scope of care per family's wish. Finally shetolerated HD in recliner chair on 4/19 without sedating medications. She will be discharged to SNF once she had outpatient HD spot andcontinues to tolerate HD on recliner chair.  She's been started on lactulose for elevated ammonia with concern for hepatic encephalopathy as well as nightly bipap for acute hypercapneic resp failure with altered mental status.    Subjective:  Patient in bed having HD, appears comfortable, denies any headache, no fever, no chest pain or pressure, no shortness of breath , no abdominal pain. No focal  weakness.   Assessment/Plan:  Acute metabolic encephalopathy: Multifactorial etiology-including AKI, hypercarbia, hospital/ICU delirium, hyperammonemia, pneumonia.  Encephalopathy has improved.  TSH/vitamin B12 within normal limits-RPR nonreactive.  Continue supportive care.  Septic shock secondary to UTI and PNA: Sepsis physiology has resolved-has completed a course of antimicrobial therapy.  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  Continue BiPAP nightly - wean oxygen as able.  Avoid sedating medications like narcotics/benzodiazepines as much as possible.  Pseudomonas aeruginosa/aspiration pneumonia with right-sided parapneumonic effusion: Has completed multiple courses of antibiotics-see below.  Patient is s/p  thoracocentesis by PCCM on 4/22-plans are to continue with Rocephin until 5/1.  Maintain aspiration precautions.  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  Noncompliant with HD but lacks capacity.  Essential hypertension: BP controlled-not on scheduled antihypertensives-remains on as needed metoprolol.    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  Hemoglobin stable-follow periodically.   Anxiety/depression/agitation: Stable this morning-calm-quiet and answered most of my questions appropriately.  Continue Paxil and BuSpar.  Maintain delirium precautions.  Due to lethargy/sedation-no longer on Haldol and Seroquel. Deemed to lack capacity by psych.  GERD: Continue PPI, single dose Maalox given on 03/17/2020 for heartburn.  Generalized weakness/debility: Secondary to acute illness-Per prior note--not able to participate in therapy. Requiring mechanical lift to chair.  Chronic pain:-Scheduled Tylenol 1000 mg 3 times daily.  No longer onoxycodone and reduced IV fentanyl  due to AMS.  Goals of care/Ethical  issues - complex medical issue with guarded prognosis. Patient refused HD previously but deemed to lack capacity by psych. Daughter wishes her to be full code with full scope of care including dialysis.Continued on HD with the help of medications and family.Palliative care following.Ethics involved previously.  Thrombocytopenia: mild - monitor.  Lab Results  Component Value Date   PLT 148 (L) 03/18/2020   DM-2 with hyperglycemia ( A1c 5.7%): No further hypoglycemic episodes-continue Lantus 8 units and SSI.    Lab Results  Component Value Date   HGBA1C 5.7 (H) 01/06/2020   CBG (last 3)  Recent Labs    03/17/20 2005 03/18/20 0034 03/18/20 0427  GLUCAP 100* 117* 129*     Nutrition Problem: Nutrition Problem: Inadequate oral intake Etiology: poor appetite, acute illness Signs/Symptoms: meal completion < 25% Interventions: Tube feeding, Nepro shake  Obesity: Estimated body mass index is 32.66 kg/m as calculated from the following:   Height as of this encounter: '5\' 4"'$  (1.626 m).   Weight as of this encounter: 86.3 kg.   DVT prophylaxis: SQ heparin  Diet: Diet Order            DIET DYS 2 Room service appropriate? No; Fluid consistency: Thin  Diet effective now               Code Status:  Full code   Family Communication:  Spoke to daughter Katharine Look [phone] on 03/15/2020  Procedures :  3/5 IJ vas cath-> 4/22 right-sided thoracocentesis>>  Significant studies:  Echo 10/24/2019 > LVEF 55%, Grade 1 DD. RV systolic function normal.  Echo 3/2: LVEF 65-70%, grade II dysfunction. RVSP  Consults:  Nephrology PCCM Psychiatry Palliative care Interventional radiology Ethics  Disposition Plan:  SNF when ready for discharge-awaiting bed.  Status is: Inpatient  Remains inpatient appropriate because:Inpatient level of care appropriate due to severity of illness   Dispo: The patient is from: [facility]               Anticipated d/c is to: SNF              Anticipated d/c date is: > 3 days              Patient currently is medically stable to d/c.  Has SNF bed in [place]-but transportation from SNF to HD is still the main barrier for discharge   Antimicrobial agents:  Anti-infectives (From admission, onward)   Start     Dose/Rate Route Frequency Ordered Stop   02/28/20 1500  cefTRIAXone (ROCEPHIN) 2 g in sodium chloride 0.9 % 100 mL IVPB     2 g 200 mL/hr over 30 Minutes Intravenous Every 24 hours 02/28/20 1404 03/05/20 2359   02/26/20 1300  cefTRIAXone (ROCEPHIN) 1 g in sodium chloride 0.9 % 100 mL IVPB  Status:  Discontinued     1 g 200 mL/hr over 30 Minutes Intravenous Daily 02/26/20 1201 02/27/20 0930   02/16/20 1230  cefTRIAXone (ROCEPHIN) 2 g in sodium chloride 0.9 % 100 mL IVPB     2 g 200 mL/hr over 30 Minutes Intravenous Every 24 hours 02/16/20 1127 02/20/20 0941   02/16/20 1230  azithromycin (ZITHROMAX) 500 mg in sodium chloride 0.9 % 250 mL IVPB     500 mg 250 mL/hr over 60 Minutes Intravenous Every 24 hours 02/16/20 1127 02/20/20 1140   01/10/20 0930  vancomycin (VANCOCIN) IVPB 1000 mg/200 mL premix  Status:  Discontinued     1,000 mg 200 mL/hr over 60 Minutes Intravenous  Once 01/10/20 0915 01/13/20 0623   01/06/20 2200  ceFEPIme (MAXIPIME) 2 g in sodium chloride 0.9 % 100 mL IVPB     2 g 200 mL/hr over 30 Minutes Intravenous Every 24 hours 01/05/20 2251 01/11/20 2234   01/05/20 2200  ceFEPIme (MAXIPIME) 2 g in sodium chloride 0.9 % 100 mL IVPB     2 g 200 mL/hr over 30 Minutes Intravenous  Once 01/05/20 2148 01/06/20 0015   01/05/20 2200  metroNIDAZOLE (FLAGYL) IVPB 500 mg     500 mg 100 mL/hr over 60 Minutes Intravenous  Once 01/05/20 2148 01/06/20 0015   01/05/20 2200  vancomycin (VANCOCIN) IVPB 1000 mg/200 mL premix  Status:  Discontinued     1,000 mg 200 mL/hr over 60 Minutes Intravenous  Once 01/05/20 2148 01/05/20 2151   01/05/20 2200  vancomycin (VANCOREADY) IVPB  2000 mg/400 mL  Status:  Discontinued     2,000 mg 200 mL/hr over 120 Minutes Intravenous  Once 01/05/20 2151 01/05/20 2223       Time spent: 25 minutes-Greater than 50% of this time was spent in counseling, explanation of diagnosis, planning of further management, and coordination of care.  MEDICATIONS: Scheduled Meds: . acetaminophen (TYLENOL) oral liquid 160 mg/5 mL  1,000 mg Per Tube Q8H  . busPIRone  5 mg Per Tube BID  . calcium acetate  667 mg Oral TID WC  . camphor-menthol   Topical QID  . chlorhexidine gluconate (MEDLINE KIT)  15 mL Mouth Rinse BID  . Chlorhexidine Gluconate Cloth  6 each Topical Q0600  . Chlorhexidine Gluconate Cloth  6 each Topical Q0600  . darbepoetin (ARANESP) injection - DIALYSIS  200 mcg Subcutaneous Q Wed-1800  . feeding supplement (NEPRO CARB STEADY)  1,000 mL Per Tube Q24H  . feeding supplement (PRO-STAT SUGAR FREE 64)  30 mL Per Tube TID  . heparin injection (subcutaneous)  5,000 Units Subcutaneous Q8H  . insulin aspart  0-9 Units Subcutaneous Q4H  . insulin glargine  8 Units Subcutaneous QHS  . lactulose  20 g Per Tube BID  . pantoprazole sodium  40 mg Per Tube BID  . PARoxetine  40 mg Per Tube Daily  . sodium chloride flush  10-40 mL Intracatheter Q12H   Continuous Infusions:  PRN Meds:.alum & mag hydroxide-simeth, feeding supplement (NEPRO CARB STEADY), guaiFENesin, heparin, metoprolol tartrate, ondansetron (ZOFRAN) IV, phenol, promethazine, sodium chloride flush   PHYSICAL EXAM: Vital signs: Vitals:   03/17/20 1600 03/17/20 2000 03/18/20 0000 03/18/20 0400  BP: (!) 152/74 (!) 146/60 (!) 133/59 (!) 125/58  Pulse: 78 79 81 80  Resp: '17 20 18 20  '$ Temp: 98.1 F (36.7 C) 97.8 F (36.6 C) 98.1 F (36.7 C) 98.2 F (36.8 C)  TempSrc: Oral Oral Oral Oral  SpO2: 99% 99% 99% 97%  Weight:      Height:       Filed Weights   03/14/20 0655 03/15/20 0558 03/16/20 0319  Weight: 86.3 kg 88.5 kg 86.3 kg   Body mass index is 32.66 kg/m.    EXAM  Awake Alert, No new F.N deficits,  PEG in place [place].AT,PERRAL Supple Neck,No JVD, No cervical lymphadenopathy appriciated.  Symmetrical Chest wall movement, Good air movement bilaterally, CTAB RRR,No Gallops, Rubs or new Murmurs, No Parasternal Heave +ve B.Sounds, Abd Soft, No tenderness, No organomegaly appriciated, No rebound - guarding or rigidity. No Cyanosis, Clubbing or edema, No new Rash or bruise   I have personally reviewed following labs and imaging studies  LABORATORY  DATA: CBC: Recent Labs  Lab 03/15/20 0218 03/16/20 0333 03/16/20 2036 03/17/20 0359 03/18/20 0547  WBC 6.3 7.3 6.3 6.1 5.4  NEUTROABS 4.5 5.6  --  4.7 4.1  HGB 8.7* 9.3* 8.7* 8.8* 8.5*  HCT 32.2* 34.5* 32.3* 32.2* 31.3*  MCV 97.9 100.0 99.1 100.0 98.7  PLT 165 182 149* 148* 148*    Basic Metabolic Panel: Recent Labs  Lab 03/12/20 0727 03/13/20 1235 03/14/20 0511 03/14/20 0511 03/14/20 1006 03/15/20 0218 03/16/20 0333 03/16/20 2036 03/17/20 0359 03/18/20 0547  NA 133*   < > 134*   < >  --  134* 137 133* 135 133*  K 3.6   < > 3.8   < >  --  3.5 3.4* 3.5 3.8 3.6  CL 97*   < > 94*   < >  --  94* 94* 94* 93* 93*  CO2 25   < > 27   < >  --  26 32 '30 28 29  '$ GLUCOSE 111*   < > 129*   < >  --  58* 118* 121* 125* 130*  BUN 58*   < > 51*   < >  --  71* 42* 54* 62* 83*  CREATININE 1.69*   < > 1.60*   < >  --  1.85* 1.27* 1.63* 1.75* 2.26*  CALCIUM 9.1   < > 9.7   < >  --  9.8 9.0 9.1 9.6 10.0  MG  --   --   --   --  2.4 2.4 2.3  --  2.5* 2.6*  PHOS 4.3  --  4.7*  --   --   --   --  4.0  --   --    < > = values in this interval not displayed.    GFR: Estimated Creatinine Clearance: 24.3 mL/min (A) (by C-G formula based on SCr of 2.26 mg/dL (H)).  Liver Function Tests: Recent Labs  Lab 03/13/20 1235 03/14/20 0511 03/15/20 0218 03/16/20 0333 03/16/20 2036 03/17/20 0359 03/18/20 0547  AST 30  --  27 28  --  27 23  ALT 19  --  19 20  --  20 18  ALKPHOS 201*  --  207* 208*  --   192* 187*  BILITOT 0.1*  --  0.2* 0.3  --  0.5 0.5  PROT 6.4*  --  6.1* 6.6  --  6.3* 5.9*  ALBUMIN 2.5*   < > 2.4* 2.5* 2.3* 2.3* 2.3*   < > = values in this interval not displayed.   No results for input(s): LIPASE, AMYLASE in the last 168 hours. Recent Labs  Lab 03/14/20 1006 03/15/20 1432 03/16/20 0333 03/17/20 0359 03/18/20 0547  AMMONIA 43* 69* 65* 47* 52*    Coagulation Profile: No results for input(s): INR, PROTIME in the last 168 hours.  Cardiac Enzymes: No results for input(s): CKTOTAL, CKMB, CKMBINDEX, TROPONINI in the last 168 hours.  BNP (last 3 results) No results for input(s): PROBNP in the last 8760 hours.  Lipid Profile: No results for input(s): CHOL, HDL, LDLCALC, TRIG, CHOLHDL, LDLDIRECT in the last 72 hours.  Thyroid Function Tests: No results for input(s): TSH, T4TOTAL, FREET4, T3FREE, THYROIDAB in the last 72 hours.  Anemia Panel: No results for input(s): VITAMINB12, FOLATE, FERRITIN, TIBC, IRON, RETICCTPCT in the last 72 hours.  Urine analysis:    Component Value Date/Time   COLORURINE YELLOW 02/25/2020 1600   APPEARANCEUR TURBID (A) 02/25/2020 1600   LABSPEC  1.021 02/25/2020 1600   PHURINE 5.0 02/25/2020 1600   GLUCOSEU NEGATIVE 02/25/2020 1600   HGBUR MODERATE (A) 02/25/2020 1600   BILIRUBINUR NEGATIVE 02/25/2020 1600   KETONESUR NEGATIVE 02/25/2020 1600   PROTEINUR 100 (A) 02/25/2020 1600   NITRITE NEGATIVE 02/25/2020 1600   LEUKOCYTESUR MODERATE (A) 02/25/2020 1600    Sepsis Labs: Lactic Acid, Venous    Component Value Date/Time   LATICACIDVEN 0.7 02/24/2020 1823    MICROBIOLOGY: Recent Results (from the past 240 hour(s))  SARS CORONAVIRUS 2 (TAT 6-24 HRS) Nasopharyngeal Nasopharyngeal Swab     Status: None   Collection Time: 03/08/20  4:16 PM   Specimen: Nasopharyngeal Swab  Result Value Ref Range Status   SARS Coronavirus 2 NEGATIVE NEGATIVE Final    Comment: (NOTE) SARS-CoV-2 target nucleic acids are NOT DETECTED. The  SARS-CoV-2 RNA is generally detectable in upper and lower respiratory specimens during the acute phase of infection. Negative results do not preclude SARS-CoV-2 infection, do not rule out co-infections with other pathogens, and should not be used as the sole basis for treatment or other patient management decisions. Negative results must be combined with clinical observations, patient history, and epidemiological information. The expected result is Negative. Fact Sheet for Patients: SugarRoll.be Fact Sheet for Healthcare Providers: https://www.woods-mathews.com/ This test is not yet approved or cleared by the [place] FDA and  has been authorized for detection and/or diagnosis of SARS-CoV-2 by FDA under an Emergency Use Authorization (EUA). This EUA will remain  in effect (meaning this test can be used) for the duration of the COVID-19 declaration under Section 56 4(b)(1) of the Act, 21 U.S.C. section 360bbb-3(b)(1), unless the authorization is terminated or revoked sooner. Performed at [facility], [place] [address], [place], [place] [address]     RADIOLOGY STUDIES/RESULTS: No results found.   LOS: 73 days   Signature  Lala Lund M.D on 03/18/2020 at 8:30 AM   -  To page go to [email or web address]

## 2020-03-18 NOTE — Progress Notes (Signed)
Patient has been refusing Bipap.  No distress noted at this time, will continue to monitor.

## 2020-03-18 NOTE — Procedures (Signed)
Patient was seen on dialysis and the procedure was supervised.  BFR 400  Via TDC BP is  133/53.   Patient appears to be tolerating treatment well- confused  Louis Meckel 03/18/2020

## 2020-03-18 NOTE — TOC Progression Note (Signed)
Transition of Care [facility]) - Progression Note    Patient Details  Name: Bailey Cox MRN: [id number] Date of Birth: [birth year]  Transition of Care [facility]) CM/SW [place], LCSW Phone Number: 03/18/2020, 4:16 PM  Clinical Narrative:    Colletta Maryland from [facility] contacted CSW and stated that they could accept patient but she is working on opening up a room on their isolation hall. She will let CSW know once it becomes available.    Expected Discharge Plan: Skilled Nursing Facility Barriers to Discharge: Continued Medical Work up, Other (comment)(ability to tolerate dialysis in recliner)  Expected Discharge Plan and Services Expected Discharge Plan: [place] In-house Referral: Clinical Social Work Discharge Planning Services: CM Consult Post Acute Care Choice: [place] Living arrangements for the past 2 months: (Has been hospitalized for over 4 months)                                       Social Determinants of Health (SDOH) Interventions    Readmission Risk Interventions Readmission Risk Prevention Plan 01/28/2020  Transportation Screening Complete  PCP or Specialist Appt within 3-5 Days Not Complete  Not Complete comments plan for SNF  HRI or [place] Not Complete  HRI or Home Care Consult comments plan for SNF  Social Work Consult for [place] Planning/Counseling Complete  Palliative Care Screening Complete  Medication Review Press photographer) Referral to Pharmacy  PCP or Specialist appointment within 3-5 days of discharge Not Complete  PCP/Specialist Appt Not Complete comments plan for SNF  HRI or Home Care Consult Complete  SW Recovery Care/Counseling Consult Complete  Palliative Care Screening Not Complete  Comments appropriate at this time  [place] Complete

## 2020-03-18 NOTE — Progress Notes (Signed)
Palliative Medicine RN Note: Our team has been following along from a distance for the past month, and no new issues have arisen that required intervention from our team. At this time we will sign off.  If new concerns arise that specifically require our help, or if the family requests to meet with [place], please reconsult [place].  Marjie Skiff Sally-Anne Wamble, RN, BSN, [facility] Palliative Medicine Team 03/18/2020 11:55 AM Office [phone]

## 2020-03-18 NOTE — Procedures (Signed)
Patient continues to refuse Bipap at this time.

## 2020-03-18 NOTE — Progress Notes (Signed)
Patient ID: Bailey Cox, female   DOB: 1946-03-10, 74 y.o.   MRN: [id number]   S: seen on HD-  Confused-goal of 3000 dialed in- BP is OK    O:BP (!) 125/58 (BP Location: Right Arm)   Pulse 80   Temp 98.2 F (36.8 C) (Oral)   Resp 20   Ht '5\' 4"'$  (1.626 m)   Wt 86.3 kg   LMP  (LMP Unknown)   SpO2 97%   BMI 32.66 kg/m   Intake/Output Summary (Last 24 hours) at 03/18/2020 0901 Last data filed at 03/17/2020 1900 Gross per 24 hour  Intake 648.67 ml  Output --  Net 648.67 ml   Intake/Output: I/O last 3 completed shifts: In: 1248.7 [P.O.:180; I.V.:200; NG/GT:868.7] Out: -   Intake/Output this shift:  No intake/output data recorded. Weight change:  Gen: NAD CVS: no rub Resp: cta Abd: +BS, soft, NT/ND Ext: trace pretibial edema  Recent Labs  Lab 03/12/20 0727 03/12/20 0727 03/13/20 1235 03/14/20 0511 03/15/20 0218 03/16/20 0333 03/16/20 2036 03/17/20 0359 03/18/20 0547  NA 133*   < > 133* 134* 134* 137 133* 135 133*  K 3.6   < > 3.5 3.8 3.5 3.4* 3.5 3.8 3.6  CL 97*   < > 91* 94* 94* 94* 94* 93* 93*  CO2 25   < > '27 27 26 '$ 32 '30 28 29  '$ GLUCOSE 111*   < > 127* 129* 58* 118* 121* 125* 130*  BUN 58*   < > 36* 51* 71* 42* 54* 62* 83*  CREATININE 1.69*   < > 1.36* 1.60* 1.85* 1.27* 1.63* 1.75* 2.26*  ALBUMIN 2.4*   < > 2.5* 2.5* 2.4* 2.5* 2.3* 2.3* 2.3*  CALCIUM 9.1   < > 9.2 9.7 9.8 9.0 9.1 9.6 10.0  PHOS 4.3  --   --  4.7*  --   --  4.0  --   --   AST  --   --  30  --  27 28  --  27 23  ALT  --   --  19  --  19 20  --  20 18   < > = values in this interval not displayed.   Liver Function Tests: Recent Labs  Lab 03/16/20 0333 03/16/20 0333 03/16/20 2036 03/17/20 0359 03/18/20 0547  AST 28  --   --  27 23  ALT 20  --   --  20 18  ALKPHOS 208*  --   --  192* 187*  BILITOT 0.3  --   --  0.5 0.5  PROT 6.6  --   --  6.3* 5.9*  ALBUMIN 2.5*   < > 2.3* 2.3* 2.3*   < > = values in this interval not displayed.   No results for input(s): LIPASE, AMYLASE in the last  168 hours. Recent Labs  Lab 03/16/20 0333 03/17/20 0359 03/18/20 0547  AMMONIA 65* 47* 52*   CBC: Recent Labs  Lab 03/15/20 0218 03/15/20 0218 03/16/20 0333 03/16/20 0333 03/16/20 2036 03/17/20 0359 03/18/20 0547  WBC 6.3   < > 7.3   < > 6.3 6.1 5.4  NEUTROABS 4.5   < > 5.6  --   --  4.7 4.1  HGB 8.7*   < > 9.3*   < > 8.7* 8.8* 8.5*  HCT 32.2*   < > 34.5*   < > 32.3* 32.2* 31.3*  MCV 97.9  --  100.0  --  99.1 100.0 98.7  PLT 165   < >  182   < > 149* 148* 148*   < > = values in this interval not displayed.   Cardiac Enzymes: No results for input(s): CKTOTAL, CKMB, CKMBINDEX, TROPONINI in the last 168 hours. CBG: Recent Labs  Lab 03/17/20 1234 03/17/20 1612 03/17/20 2005 03/18/20 0034 03/18/20 0427  GLUCAP 113* 131* 100* 117* 129*    Iron Studies: No results for input(s): IRON, TIBC, TRANSFERRIN, FERRITIN in the last 72 hours. Studies/Results: No results found. [place] Kitchen acetaminophen (TYLENOL) oral liquid 160 mg/5 mL  1,000 mg Per Tube Q8H  . busPIRone  5 mg Per Tube BID  . calcium acetate  667 mg Oral TID WC  . camphor-menthol   Topical QID  . chlorhexidine gluconate (MEDLINE KIT)  15 mL Mouth Rinse BID  . Chlorhexidine Gluconate Cloth  6 each Topical Q0600  . Chlorhexidine Gluconate Cloth  6 each Topical Q0600  . darbepoetin (ARANESP) injection - DIALYSIS  200 mcg Subcutaneous Q Wed-1800  . feeding supplement (NEPRO CARB STEADY)  1,000 mL Per Tube Q24H  . feeding supplement (PRO-STAT SUGAR FREE 64)  30 mL Per Tube TID  . heparin injection (subcutaneous)  5,000 Units Subcutaneous Q8H  . insulin aspart  0-9 Units Subcutaneous Q4H  . insulin glargine  8 Units Subcutaneous QHS  . lactulose  30 g Per Tube BID  . pantoprazole sodium  40 mg Per Tube BID  . PARoxetine  40 mg Per Tube Daily  . sodium chloride flush  10-40 mL Intracatheter Q12H    BMET    Component Value Date/Time   NA 133 (L) 03/18/2020 0547   K 3.6 03/18/2020 0547   CL 93 (L) 03/18/2020 0547   CO2  29 03/18/2020 0547   GLUCOSE 130 (H) 03/18/2020 0547   BUN 83 (H) 03/18/2020 0547   CREATININE 2.26 (H) 03/18/2020 0547   CALCIUM 10.0 03/18/2020 0547   GFRNONAA 21 (L) 03/18/2020 0547   GFRAA 24 (L) 03/18/2020 0547   CBC    Component Value Date/Time   WBC 5.4 03/18/2020 0547   RBC 3.17 (L) 03/18/2020 0547   HGB 8.5 (L) 03/18/2020 0547   HCT 31.3 (L) 03/18/2020 0547   PLT 148 (L) 03/18/2020 0547   MCV 98.7 03/18/2020 0547   MCH 26.8 03/18/2020 0547   MCHC 27.2 (L) 03/18/2020 0547   RDW 17.1 (H) 03/18/2020 0547   LYMPHSABS 0.6 (L) 03/18/2020 0547   MONOABS 0.5 03/18/2020 0547   EOSABS 0.1 03/18/2020 0547   BASOSABS 0.1 03/18/2020 0547    Assessment/Plan:  1. New ESRD in setting of OliguricAKI/CKD stage 3- due to ischemic ATN in setting of sepsis. She has remained dialysis dependent since 01/09/20 and will require ongoing IHD. Now on MWF schedule for outpatient HD at [facility], [place]- has been accepted there.   1. Using TDC 2. Tolerating HD in recliner 3. Remains oliguric with rapidly rising BUN. Scr rising but less than 2 likely due to lack of muscle mass and malnutrition. 4. Continue with MWF schedule- will do M/Th/Sat this week due to high HD volume-  Resume MWF on Monday.  Not sure what is happening with discharge at this time 2. AMS- continues to wax and wane.  Started on lactulose for elevated ammonia and bipap nightly for acute hypercapneic resp failure  3. Anemia of CKD- on high dose ESA  4. H/o PEA arrest 5. HFpEF-  volume overload. Will UF as tolerated  6. DM- per primary 7. Acute on chronic combined hypxic and hypercapnic respiratory  failure- due to aspiration pneumonia. Was on trach but decannulated on 01/23/20. Continue with BiPAP nightly. 8. Bones-  Phos is good on phoslo-  PTH 59 at last check - no meds 9. Disposition- for discharge to SNF and arranged for outpatient HD in [facility], [place], awaiting transportation issues to be resolved.  She is not  really appropriate for OP HD but family is insistent-  We were lucky to find an OP unit to accept her  [place] [phone]

## 2020-03-19 LAB — GLUCOSE, CAPILLARY
Glucose-Capillary: 113 mg/dL — ABNORMAL HIGH (ref 70–99)
Glucose-Capillary: 124 mg/dL — ABNORMAL HIGH (ref 70–99)
Glucose-Capillary: 125 mg/dL — ABNORMAL HIGH (ref 70–99)
Glucose-Capillary: 127 mg/dL — ABNORMAL HIGH (ref 70–99)
Glucose-Capillary: 128 mg/dL — ABNORMAL HIGH (ref 70–99)
Glucose-Capillary: 131 mg/dL — ABNORMAL HIGH (ref 70–99)
Glucose-Capillary: 87 mg/dL (ref 70–99)

## 2020-03-19 MED ORDER — PRO-STAT SUGAR FREE PO LIQD
30.0000 mL | Freq: Two times a day (BID) | ORAL | Status: DC
  Administered 2020-03-20 – 2020-03-21 (×3): 30 mL
  Filled 2020-03-19 (×3): qty 30

## 2020-03-19 MED ORDER — JEVITY 1.5 CAL/FIBER PO LIQD
1000.0000 mL | ORAL | Status: DC
  Administered 2020-03-19 – 2020-03-21 (×2): 1000 mL
  Filled 2020-03-19 (×3): qty 1000

## 2020-03-19 MED ORDER — AMLODIPINE BESYLATE 10 MG PO TABS
10.0000 mg | ORAL_TABLET | Freq: Every day | ORAL | Status: DC
  Administered 2020-03-19 – 2020-03-21 (×3): 10 mg
  Filled 2020-03-19 (×3): qty 1

## 2020-03-19 MED ORDER — CHLORHEXIDINE GLUCONATE CLOTH 2 % EX PADS
6.0000 | MEDICATED_PAD | Freq: Every day | CUTANEOUS | Status: DC
  Administered 2020-03-20 – 2020-03-21 (×2): 6 via TOPICAL

## 2020-03-19 NOTE — Progress Notes (Signed)
Patient has been refusing Bipap.  Checked on patient, patient sleeping, no distress noted at this time.

## 2020-03-19 NOTE — Progress Notes (Signed)
PROGRESS NOTE        PATIENT DETAILS Name: Bailey Cox Age: 74 y.o. Sex: female Date of Birth: [birth year] Admit Date: 01/05/2020 Admitting Physician Kandis Cocking, MD WCB:[id number], No Pcp Per  Brief Narrative: 74 year old female with history of cholecystitis status post cholecystectomy in 9/20 at [facility]. She was discharged to SNF but rehospitalized to [facility] and had bile duct stent. Discharged back to SNF but readmitted with Ogilive syndrome. Hospital course complicated by fluid overload and transferred to [facility] for dialysis. At [facility], developed respiratory failure requiring vent support. She also suffered a cardiac arrest during EGD. She underwent trach,she was eventually discharged to [facility] on 3/1 after prolonged hospitalization at [facility] but deemed too sick, and sent to [facility] due to fluid overload and acidosis.  Patient was initially admitted to ICU. Required CRRT 3/9-3/12. Transfer to floor on 3/14. She pulled her trach. Now on oxygen by nasal cannula. She is on Intermitted HD via [place]. Has refused dialysis at times but deemed to lack capacity. Family wants full scope of care including full code and dialysis. She has been noncompliant with care and hemodialysis but lacks capacity. Ethics committee meeting held on 4/6. The plan is to continue full scope of care per family's wish. Finally shetolerated HD in recliner chair on 4/19 without sedating medications. She will be discharged to SNF once she had outpatient HD spot andcontinues to tolerate HD on recliner chair.  She's been started on lactulose for elevated ammonia with concern for hepatic encephalopathy as well as nightly bipap for acute hypercapneic resp failure with altered mental status.    Subjective:  Patient in bed, appears comfortable, denies any headache, no fever, no chest pain or pressure, no shortness of breath , no abdominal pain. No focal  weakness.   Assessment/Plan:  Acute metabolic encephalopathy: Multifactorial etiology-including AKI, hypercarbia, hospital/ICU delirium, hyperammonemia, pneumonia.  Encephalopathy has improved.  TSH/vitamin B12 within normal limits-RPR nonreactive.  Continue supportive care.  Septic shock secondary to UTI and PNA: Sepsis physiology has resolved-has completed a course of antimicrobial therapy.  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  Continue BiPAP nightly (noncompliant and counseled again on 03/19/2020) - wean oxygen as able in am.  Avoid sedating medications like narcotics/benzodiazepines as much as possible.  Pseudomonas aeruginosa/aspiration pneumonia with right-sided parapneumonic effusion: Has completed multiple courses of antibiotics-see below.  Patient is s/p  thoracocentesis by PCCM on 4/22-plans are to continue with Rocephin until 5/1.  Maintain aspiration precautions.  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  Noncompliant with HD but lacks capacity.  Essential hypertension: BP controlled-not on scheduled antihypertensives-remains on as needed metoprolol.    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  Hemoglobin stable-follow periodically.   Anxiety/depression/agitation: Stable this morning-calm-quiet and answered most of my questions appropriately.  Continue Paxil and BuSpar.  Maintain delirium precautions.  Due to lethargy/sedation-no longer on Haldol and Seroquel. Deemed to lack capacity by psych.  GERD: Continue PPI, single dose Maalox given on 03/17/2020 for heartburn.  Generalized weakness/debility: Secondary to acute illness-Per prior note--not able to participate in therapy. Requiring mechanical lift to chair.  Chronic pain:-Scheduled Tylenol 1000 mg 3 times  daily.  No longer onoxycodone and reduced IV fentanyl due  to AMS.  Goals of care/Ethical issues - complex medical issue with guarded prognosis. Patient refused HD previously but deemed to lack capacity by psych. Daughter wishes her to be full code with full scope of care including dialysis.Continued on HD with the help of medications and family.Palliative care following.Ethics involved previously.  Thrombocytopenia: mild - monitor.  Lab Results  Component Value Date   PLT 148 (L) 03/18/2020   DM-2 with hyperglycemia ( A1c 5.7%): No further hypoglycemic episodes-continue Lantus 8 units and SSI.    Lab Results  Component Value Date   HGBA1C 5.7 (H) 01/06/2020   CBG (last 3)  Recent Labs    03/19/20 0018 03/19/20 0406 03/19/20 0732  GLUCAP 124* 87 113*     Nutrition Problem: Nutrition Problem: Inadequate oral intake Etiology: poor appetite, acute illness Signs/Symptoms: meal completion < 25% Interventions: Tube feeding, Nepro shake  Obesity: Estimated body mass index is 32.28 kg/m as calculated from the following:   Height as of this encounter: '5\' 4"'$  (1.626 m).   Weight as of this encounter: 85.3 kg.   DVT prophylaxis: SQ heparin  Diet: Diet Order            DIET DYS 2 Room service appropriate? No; Fluid consistency: Thin  Diet effective now               Code Status:  Full code   Family Communication:  Spoke to daughter Katharine Look [phone] on 03/15/2020  Procedures :  3/5 IJ vas cath-> 4/22 right-sided thoracocentesis>>  Significant studies:  Echo 10/24/2019 > LVEF 55%, Grade 1 DD. RV systolic function normal.  Echo 3/2: LVEF 65-70%, grade II dysfunction. RVSP  Consults:  Nephrology PCCM Psychiatry Palliative care Interventional radiology Ethics  Disposition Plan:  SNF when ready for discharge-awaiting bed.  Status is: Inpatient  Remains inpatient appropriate because:Inpatient level of care appropriate due to severity of  illness   Dispo: The patient is from: [facility]              Anticipated d/c is to: SNF              Anticipated d/c date is: > 3 days              Patient currently is medically stable to d/c.  Has SNF bed in [place]-but transportation from SNF to HD is still the main barrier for discharge   Antimicrobial agents:  Anti-infectives (From admission, onward)   Start     Dose/Rate Route Frequency Ordered Stop   02/28/20 1500  cefTRIAXone (ROCEPHIN) 2 g in sodium chloride 0.9 % 100 mL IVPB     2 g 200 mL/hr over 30 Minutes Intravenous Every 24 hours 02/28/20 1404 03/05/20 2359   02/26/20 1300  cefTRIAXone (ROCEPHIN) 1 g in sodium chloride 0.9 % 100 mL IVPB  Status:  Discontinued     1 g 200 mL/hr over 30 Minutes Intravenous Daily 02/26/20 1201 02/27/20 0930   02/16/20 1230  cefTRIAXone (ROCEPHIN) 2 g in sodium chloride 0.9 % 100 mL IVPB     2 g 200 mL/hr over 30 Minutes Intravenous Every 24 hours 02/16/20 1127 02/20/20 0941   02/16/20 1230  azithromycin (ZITHROMAX) 500 mg in sodium chloride 0.9 % 250 mL IVPB     500 mg 250 mL/hr over 60 Minutes Intravenous Every 24 hours 02/16/20 1127 02/20/20 1140   01/10/20 0930  vancomycin (VANCOCIN) IVPB 1000 mg/200 mL premix  Status:  Discontinued     1,000 mg 200  mL/hr over 60 Minutes Intravenous  Once 01/10/20 0915 01/13/20 0623   01/06/20 2200  ceFEPIme (MAXIPIME) 2 g in sodium chloride 0.9 % 100 mL IVPB     2 g 200 mL/hr over 30 Minutes Intravenous Every 24 hours 01/05/20 2251 01/11/20 2234   01/05/20 2200  ceFEPIme (MAXIPIME) 2 g in sodium chloride 0.9 % 100 mL IVPB     2 g 200 mL/hr over 30 Minutes Intravenous  Once 01/05/20 2148 01/06/20 0015   01/05/20 2200  metroNIDAZOLE (FLAGYL) IVPB 500 mg     500 mg 100 mL/hr over 60 Minutes Intravenous  Once 01/05/20 2148 01/06/20 0015   01/05/20 2200  vancomycin (VANCOCIN) IVPB 1000 mg/200 mL premix  Status:  Discontinued     1,000 mg 200 mL/hr over 60 Minutes Intravenous  Once 01/05/20 2148  01/05/20 2151   01/05/20 2200  vancomycin (VANCOREADY) IVPB 2000 mg/400 mL  Status:  Discontinued     2,000 mg 200 mL/hr over 120 Minutes Intravenous  Once 01/05/20 2151 01/05/20 2223       Time spent: 25 minutes-Greater than 50% of this time was spent in counseling, explanation of diagnosis, planning of further management, and coordination of care.  MEDICATIONS: Scheduled Meds: . acetaminophen (TYLENOL) oral liquid 160 mg/5 mL  1,000 mg Per Tube Q8H  . amLODipine  10 mg Per Tube Daily  . busPIRone  5 mg Per Tube BID  . calcium acetate  667 mg Oral TID WC  . camphor-menthol   Topical QID  . chlorhexidine gluconate (MEDLINE KIT)  15 mL Mouth Rinse BID  . Chlorhexidine Gluconate Cloth  6 each Topical Q0600  . darbepoetin (ARANESP) injection - DIALYSIS  200 mcg Subcutaneous Q Wed-1800  . feeding supplement (NEPRO CARB STEADY)  1,000 mL Per Tube Q24H  . feeding supplement (PRO-STAT SUGAR FREE 64)  30 mL Per Tube TID  . heparin injection (subcutaneous)  5,000 Units Subcutaneous Q8H  . insulin aspart  0-9 Units Subcutaneous Q4H  . insulin glargine  8 Units Subcutaneous QHS  . lactulose  30 g Per Tube BID  . pantoprazole sodium  40 mg Per Tube BID  . PARoxetine  40 mg Per Tube Daily  . sodium chloride flush  10-40 mL Intracatheter Q12H   Continuous Infusions:  PRN Meds:.alum & mag hydroxide-simeth, feeding supplement (NEPRO CARB STEADY), guaiFENesin, metoprolol tartrate, ondansetron (ZOFRAN) IV, phenol   PHYSICAL EXAM: Vital signs: Vitals:   03/18/20 2000 03/19/20 0000 03/19/20 0405 03/19/20 0734  BP: (!) 144/61 139/63 (!) 153/76 (!) 153/76  Pulse: 79 80 74 81  Resp: (!) '21 17 17 18  '$ Temp: 98.2 F (36.8 C) 98 F (36.7 C) 98 F (36.7 C) 98.5 F (36.9 C)  TempSrc: Oral Oral Oral Oral  SpO2: 100% 100% 100% 100%  Weight:      Height:       Filed Weights   03/16/20 0319 03/18/20 0730 03/18/20 1112  Weight: 86.3 kg 89 kg 85.3 kg   Body mass index is 32.28 kg/m.    EXAM  Awake Alert, No new F.N deficits,  PEG in place [place].AT,PERRAL Supple Neck,No JVD, No cervical lymphadenopathy appriciated.  Symmetrical Chest wall movement, Good air movement bilaterally, CTAB RRR,No Gallops, Rubs or new Murmurs, No Parasternal Heave +ve B.Sounds, Abd Soft, No tenderness, No organomegaly appriciated, No rebound - guarding or rigidity. No Cyanosis, Clubbing or edema, No new Rash or bruise    I have personally reviewed following labs and imaging studies  LABORATORY DATA: CBC: Recent Labs  Lab 03/15/20 0218 03/16/20 0333 03/16/20 2036 03/17/20 0359 03/18/20 0547  WBC 6.3 7.3 6.3 6.1 5.4  NEUTROABS 4.5 5.6  --  4.7 4.1  HGB 8.7* 9.3* 8.7* 8.8* 8.5*  HCT 32.2* 34.5* 32.3* 32.2* 31.3*  MCV 97.9 100.0 99.1 100.0 98.7  PLT 165 182 149* 148* 148*    Basic Metabolic Panel: Recent Labs  Lab 03/14/20 0511 03/14/20 0511 03/14/20 1006 03/15/20 0218 03/16/20 0333 03/16/20 2036 03/17/20 0359 03/18/20 0547  NA 134*   < >  --  134* 137 133* 135 133*  K 3.8   < >  --  3.5 3.4* 3.5 3.8 3.6  CL 94*   < >  --  94* 94* 94* 93* 93*  CO2 27   < >  --  26 32 '30 28 29  '$ GLUCOSE 129*   < >  --  58* 118* 121* 125* 130*  BUN 51*   < >  --  71* 42* 54* 62* 83*  CREATININE 1.60*   < >  --  1.85* 1.27* 1.63* 1.75* 2.26*  CALCIUM 9.7   < >  --  9.8 9.0 9.1 9.6 10.0  MG  --   --  2.4 2.4 2.3  --  2.5* 2.6*  PHOS 4.7*  --   --   --   --  4.0  --   --    < > = values in this interval not displayed.    GFR: Estimated Creatinine Clearance: 24.1 mL/min (A) (by C-G formula based on SCr of 2.26 mg/dL (H)).  Liver Function Tests: Recent Labs  Lab 03/13/20 1235 03/14/20 0511 03/15/20 0218 03/16/20 0333 03/16/20 2036 03/17/20 0359 03/18/20 0547  AST 30  --  27 28  --  27 23  ALT 19  --  19 20  --  20 18  ALKPHOS 201*  --  207* 208*  --  192* 187*  BILITOT 0.1*  --  0.2* 0.3  --  0.5 0.5  PROT 6.4*  --  6.1* 6.6  --  6.3* 5.9*  ALBUMIN 2.5*   < > 2.4* 2.5* 2.3*  2.3* 2.3*   < > = values in this interval not displayed.   No results for input(s): LIPASE, AMYLASE in the last 168 hours. Recent Labs  Lab 03/14/20 1006 03/15/20 1432 03/16/20 0333 03/17/20 0359 03/18/20 0547  AMMONIA 43* 69* 65* 47* 52*    Coagulation Profile: No results for input(s): INR, PROTIME in the last 168 hours.  Cardiac Enzymes: No results for input(s): CKTOTAL, CKMB, CKMBINDEX, TROPONINI in the last 168 hours.  BNP (last 3 results) No results for input(s): PROBNP in the last 8760 hours.  Lipid Profile: No results for input(s): CHOL, HDL, LDLCALC, TRIG, CHOLHDL, LDLDIRECT in the last 72 hours.  Thyroid Function Tests: No results for input(s): TSH, T4TOTAL, FREET4, T3FREE, THYROIDAB in the last 72 hours.  Anemia Panel: No results for input(s): VITAMINB12, FOLATE, FERRITIN, TIBC, IRON, RETICCTPCT in the last 72 hours.  Urine analysis:    Component Value Date/Time   COLORURINE YELLOW 02/25/2020 1600   APPEARANCEUR TURBID (A) 02/25/2020 1600   LABSPEC 1.021 02/25/2020 1600   PHURINE 5.0 02/25/2020 1600   GLUCOSEU NEGATIVE 02/25/2020 1600   HGBUR MODERATE (A) 02/25/2020 1600   BILIRUBINUR NEGATIVE 02/25/2020 1600   KETONESUR NEGATIVE 02/25/2020 1600   PROTEINUR 100 (A) 02/25/2020 1600   NITRITE NEGATIVE 02/25/2020 1600   LEUKOCYTESUR MODERATE (A) 02/25/2020  1600    Sepsis Labs: Lactic Acid, Venous    Component Value Date/Time   LATICACIDVEN 0.7 02/24/2020 1823    MICROBIOLOGY: No results found for this or any previous visit (from the past 240 hour(s)).  RADIOLOGY STUDIES/RESULTS: No results found.   LOS: 74 days   Signature  Lala Lund M.D on 03/19/2020 at 9:09 AM   -  To page go to [email or web address]

## 2020-03-19 NOTE — Progress Notes (Signed)
Patient ID: Bailey Cox, female   DOB: 02-21-1946, 74 y.o.   MRN: [id number]   S: Minimally conversant.  Tolerated dialysis yesterday without any issues.  No complaints.   O:BP (!) 153/76 (BP Location: Right Arm)   Pulse 81   Temp 98.5 F (36.9 C) (Oral)   Resp 18   Ht '5\' 4"'$  (1.626 m)   Wt 85.3 kg   LMP  (LMP Unknown)   SpO2 100%   BMI 32.28 kg/m   Intake/Output Summary (Last 24 hours) at 03/19/2020 1142 Last data filed at 03/19/2020 0611 Gross per 24 hour  Intake 1938.67 ml  Output --  Net 1938.67 ml   Intake/Output: I/O last 3 completed shifts: In: 1938.7 [NG/GT:1938.7] Out: 3000 [Other:3000]  Intake/Output this shift:  No intake/output data recorded. Weight change:  Gen: NAD lying in bed CVS: Normal rate, systolic murmur present Resp: No increased work of breathing, bilateral chest rise Abd: +BS, soft, NT/ND Ext: trace pretibial edema  Recent Labs  Lab 03/13/20 1235 03/14/20 0511 03/15/20 0218 03/16/20 0333 03/16/20 2036 03/17/20 0359 03/18/20 0547  NA 133* 134* 134* 137 133* 135 133*  K 3.5 3.8 3.5 3.4* 3.5 3.8 3.6  CL 91* 94* 94* 94* 94* 93* 93*  CO2 '27 27 26 '$ 32 '30 28 29  '$ GLUCOSE 127* 129* 58* 118* 121* 125* 130*  BUN 36* 51* 71* 42* 54* 62* 83*  CREATININE 1.36* 1.60* 1.85* 1.27* 1.63* 1.75* 2.26*  ALBUMIN 2.5* 2.5* 2.4* 2.5* 2.3* 2.3* 2.3*  CALCIUM 9.2 9.7 9.8 9.0 9.1 9.6 10.0  PHOS  --  4.7*  --   --  4.0  --   --   AST 30  --  27 28  --  27 23  ALT 19  --  19 20  --  20 18   Liver Function Tests: Recent Labs  Lab 03/16/20 0333 03/16/20 0333 03/16/20 2036 03/17/20 0359 03/18/20 0547  AST 28  --   --  27 23  ALT 20  --   --  20 18  ALKPHOS 208*  --   --  192* 187*  BILITOT 0.3  --   --  0.5 0.5  PROT 6.6  --   --  6.3* 5.9*  ALBUMIN 2.5*   < > 2.3* 2.3* 2.3*   < > = values in this interval not displayed.   No results for input(s): LIPASE, AMYLASE in the last 168 hours. Recent Labs  Lab 03/16/20 0333 03/17/20 0359 03/18/20 0547   AMMONIA 65* 47* 52*   CBC: Recent Labs  Lab 03/15/20 0218 03/15/20 0218 03/16/20 0333 03/16/20 0333 03/16/20 2036 03/17/20 0359 03/18/20 0547  WBC 6.3   < > 7.3   < > 6.3 6.1 5.4  NEUTROABS 4.5   < > 5.6  --   --  4.7 4.1  HGB 8.7*   < > 9.3*   < > 8.7* 8.8* 8.5*  HCT 32.2*   < > 34.5*   < > 32.3* 32.2* 31.3*  MCV 97.9  --  100.0  --  99.1 100.0 98.7  PLT 165   < > 182   < > 149* 148* 148*   < > = values in this interval not displayed.   Cardiac Enzymes: No results for input(s): CKTOTAL, CKMB, CKMBINDEX, TROPONINI in the last 168 hours. CBG: Recent Labs  Lab 03/18/20 1633 03/18/20 2015 03/19/20 0018 03/19/20 0406 03/19/20 0732  GLUCAP 143* 90 124* 87 113*    Iron  Studies: No results for input(s): IRON, TIBC, TRANSFERRIN, FERRITIN in the last 72 hours. Studies/Results: No results found. Marland Kitchen acetaminophen (TYLENOL) oral liquid 160 mg/5 mL  1,000 mg Per Tube Q8H  . amLODipine  10 mg Per Tube Daily  . busPIRone  5 mg Per Tube BID  . calcium acetate  667 mg Oral TID WC  . camphor-menthol   Topical QID  . chlorhexidine gluconate (MEDLINE KIT)  15 mL Mouth Rinse BID  . Chlorhexidine Gluconate Cloth  6 each Topical Q0600  . darbepoetin (ARANESP) injection - DIALYSIS  200 mcg Subcutaneous Q Wed-1800  . feeding supplement (NEPRO CARB STEADY)  1,000 mL Per Tube Q24H  . feeding supplement (PRO-STAT SUGAR FREE 64)  30 mL Per Tube TID  . heparin injection (subcutaneous)  5,000 Units Subcutaneous Q8H  . insulin aspart  0-9 Units Subcutaneous Q4H  . insulin glargine  8 Units Subcutaneous QHS  . lactulose  30 g Per Tube BID  . pantoprazole sodium  40 mg Per Tube BID  . PARoxetine  40 mg Per Tube Daily  . sodium chloride flush  10-40 mL Intracatheter Q12H    BMET    Component Value Date/Time   NA 133 (L) 03/18/2020 0547   K 3.6 03/18/2020 0547   CL 93 (L) 03/18/2020 0547   CO2 29 03/18/2020 0547   GLUCOSE 130 (H) 03/18/2020 0547   BUN 83 (H) 03/18/2020 0547   CREATININE  2.26 (H) 03/18/2020 0547   CALCIUM 10.0 03/18/2020 0547   GFRNONAA 21 (L) 03/18/2020 0547   GFRAA 24 (L) 03/18/2020 0547   CBC    Component Value Date/Time   WBC 5.4 03/18/2020 0547   RBC 3.17 (L) 03/18/2020 0547   HGB 8.5 (L) 03/18/2020 0547   HCT 31.3 (L) 03/18/2020 0547   PLT 148 (L) 03/18/2020 0547   MCV 98.7 03/18/2020 0547   MCH 26.8 03/18/2020 0547   MCHC 27.2 (L) 03/18/2020 0547   RDW 17.1 (H) 03/18/2020 0547   LYMPHSABS 0.6 (L) 03/18/2020 0547   MONOABS 0.5 03/18/2020 0547   EOSABS 0.1 03/18/2020 0547   BASOSABS 0.1 03/18/2020 0547    Assessment/Plan:  1. New ESRD in setting of OliguricAKI/CKD stage 3- due to ischemic ATN in setting of sepsis. She has remained dialysis dependent since 01/09/20 and will require ongoing IHD. Now on MWF schedule for outpatient HD at [facility], [place]- has been accepted there.  1. Using TDC 2. Tolerating HD in recliner 3. Remains oliguric with rapidly rising BUN. Small changes in serum creatinine likely due to low muscle mass 4. Continue with MWF schedule- will do M/Th/Sat this week due to high HD volume-  Resume MWF on Monday.  Not sure what is happening with discharge at this time 2. AMS- continues to wax and wane.  Started on lactulose for elevated ammonia and bipap nightly for acute hypercapneic resp failure.  Stable today 3. Anemia of CKD- on high dose ESA 4. H/o PEA arrest 5. HFpEF-  volume overload. Will UF as tolerated  6. DM- per primary 7. 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 Was on trach but decannulated on 01/23/20. Continue with BiPAP nightly. 8. Bones-  Phos is good on phoslo-  PTH 59 at last check - no meds 9. Disposition- for discharge to SNF and arranged for outpatient HD in [place], [place], awaiting transportation issues to be resolved.  She is not really appropriate for OP HD but family is insistent-  We were lucky to find an OP unit to accept  her  [place] [phone]

## 2020-03-19 NOTE — Progress Notes (Signed)
Per CSW/N/Rayyan, patient has a discharge plan for Sunday, 03/21/20. Transportation will be here to pick up patient at 11:30am. Navigator appreciates TOC team's hard work to arrange plan for patient.  Navigator informed OP HD clinic/[facility] and will fax records Monday morning.  Navigator also updated inpt HD unit and Nephrologist/Dr. Joylene Grapes of patient's discharge plan.   Alphonzo Cruise, [place] Renal Navigator [phone]

## 2020-03-19 NOTE — Progress Notes (Signed)
Nutrition Follow-up  DOCUMENTATION CODES:   Not applicable  INTERVENTION:  Initiate Jevity 1.5 formula via PEG at 35 ml/hr and increase by 10 ml every 4 hours to goal rate of 55 ml/hr.   Provide 30 ml Prostat (or equivalent) BID per tube.   Tube feeding regimen provides 2180 kcal (100% of needs), 113 grams of protein, and 1003 ml water.   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 3/19- s/p decannulation. Pt with AKI/CKD stage 3 now iHD dependent.  Pt continues on a dysphagia 2 diet with thin liquids. Pt continues to refuse most to all PO at meals, this continues on enteral nutrition to provide 100% of nutrition needs. RD consulted to switch formula as [facility] accepts either Jevity or Osmolite formula. RD to modify tube feeding formula and orders. Plans for discharge Sunday, 5/16.   Labs and medications reviewed.   Diet Order:   Diet Order            DIET DYS 2 Room service appropriate? No; Fluid consistency: Thin  Diet effective now              EDUCATION NEEDS:   No education needs have been identified at this time  Skin:  Skin Assessment: Reviewed RN Assessment Skin Integrity Issues:: Other (Comment) Stage I:  N/A Other: MASD abdomen  Last BM:  5/13  Height:   Ht Readings from Last 1 Encounters:  01/05/20 _0  (1.626 m)    Weight:   Wt Readings from Last 1 Encounters:  03/18/20 85.3 kg    BMI:  Body mass index is 32.28 kg/m.  Estimated Nutritional Needs:   Kcal:  1900-2200  Protein:  110-125 grams  Fluid:  1 L + UOP   Corrin Parker, MS, RD, LDN RD pager number/after hours weekend pager number on Amion.

## 2020-03-19 NOTE — TOC Progression Note (Addendum)
Transition of Care [facility]) - Progression Note    Patient Details  Name: Bailey Cox MRN: [id number] Date of Birth: 1946-05-19  Transition of Care [facility]) CM/SW [place], LCSW Phone Number: 03/19/2020, 2:45 PM  Clinical Narrative:    12pm-[facility] [place] contacted CSW and stated they will be able to accept patient Sunday since patient will receive dialysis tomorrow. CSW contacted [facility] to arrange transportation however, they reported patient would need a letter of guarantee from the hospital in case insurance does not pay. CSW contacted [facility] Supervisor to see what can be approved as patient requires oxygen. CSW also left another voicemail for [facility] to see if someone can assist in getting the patient's thing to [facility].   2pm-TOC Director contacted [facility] and has arranged for them to contact [facility] Director in the event insurance denies patient. CSW contacted [facility] and scheduled transportation for Sunday, 5/16, at 11:30am. CSW made patient's daughter aware.  Per [facility], patient will need to be changed to Jevity 1.5 or Osmolite for tube feedings. MD consulting Dietician to see if switch can be made. Patient will also need an updated COVID test. CSW to fax DC Summary on Sunday to f. [phone]. Nurse report number will be: phone# [phone]   Expected Discharge Plan: Skilled Nursing Facility Barriers to Discharge: Continued Medical Work up, Other (comment)(ability to tolerate dialysis in recliner)  Expected Discharge Plan and Services Expected Discharge Plan: [place] In-house Referral: Clinical Social Work Discharge Planning Services: CM Consult Post Acute Care Choice: [place] Living arrangements for the past 2 months: (Has been hospitalized for over 4 months)                                       Social Determinants of Health (SDOH) Interventions    Readmission Risk  Interventions Readmission Risk Prevention Plan 01/28/2020  Transportation Screening Complete  PCP or Specialist Appt within 3-5 Days Not Complete  Not Complete comments plan for SNF  HRI or [place] Not Complete  HRI or Home Care Consult comments plan for SNF  Social Work Consult for [place] Planning/Counseling Complete  Palliative Care Screening Complete  Medication Review Press photographer) Referral to Pharmacy  PCP or Specialist appointment within 3-5 days of discharge Not Complete  PCP/Specialist Appt Not Complete comments plan for SNF  HRI or Home Care Consult Complete  SW Recovery Care/Counseling Consult Complete  Palliative Care Screening Not Complete  Comments appropriate at this time  [place] Complete

## 2020-03-20 LAB — CBC
HCT: 35.6 % — ABNORMAL LOW (ref 36.0–46.0)
Hemoglobin: 9.4 g/dL — ABNORMAL LOW (ref 12.0–15.0)
MCH: 27.2 pg (ref 26.0–34.0)
MCHC: 26.4 g/dL — ABNORMAL LOW (ref 30.0–36.0)
MCV: 102.9 fL — ABNORMAL HIGH (ref 80.0–100.0)
Platelets: 164 10*3/uL (ref 150–400)
RBC: 3.46 MIL/uL — ABNORMAL LOW (ref 3.87–5.11)
RDW: 17.1 % — ABNORMAL HIGH (ref 11.5–15.5)
WBC: 7.2 10*3/uL (ref 4.0–10.5)
nRBC: 0.3 % — ABNORMAL HIGH (ref 0.0–0.2)

## 2020-03-20 LAB — RENAL FUNCTION PANEL
Albumin: 2.6 g/dL — ABNORMAL LOW (ref 3.5–5.0)
Anion gap: 13 (ref 5–15)
BUN: 69 mg/dL — ABNORMAL HIGH (ref 8–23)
CO2: 29 mmol/L (ref 22–32)
Calcium: 10.1 mg/dL (ref 8.9–10.3)
Chloride: 97 mmol/L — ABNORMAL LOW (ref 98–111)
Creatinine, Ser: 1.93 mg/dL — ABNORMAL HIGH (ref 0.44–1.00)
GFR calc Af Amer: 30 mL/min — ABNORMAL LOW (ref >60)
GFR calc non Af Amer: 26 mL/min — ABNORMAL LOW (ref >60)
Glucose, Bld: 152 mg/dL — ABNORMAL HIGH (ref 70–99)
Phosphorus: 5.3 mg/dL — ABNORMAL HIGH (ref 2.5–4.6)
Potassium: 4.1 mmol/L (ref 3.5–5.1)
Sodium: 139 mmol/L (ref 135–145)

## 2020-03-20 LAB — GLUCOSE, CAPILLARY
Glucose-Capillary: 115 mg/dL — ABNORMAL HIGH (ref 70–99)
Glucose-Capillary: 144 mg/dL — ABNORMAL HIGH (ref 70–99)
Glucose-Capillary: 87 mg/dL (ref 70–99)
Glucose-Capillary: 125 mg/dL — ABNORMAL HIGH (ref 70–99)
Glucose-Capillary: 110 mg/dL — ABNORMAL HIGH (ref 70–99)

## 2020-03-20 LAB — SARS CORONAVIRUS 2 (TAT 6-24 HRS): SARS Coronavirus 2: NEGATIVE

## 2020-03-20 MED ORDER — ALTEPLASE 2 MG IJ SOLR: 2.0000 mg | Freq: Once | INTRAMUSCULAR | Status: DC | PRN

## 2020-03-20 MED ORDER — LIDOCAINE HCL (PF) 1 % IJ SOLN: 5.0000 mL | INTRAMUSCULAR | Status: DC | PRN

## 2020-03-20 MED ORDER — PENTAFLUOROPROP-TETRAFLUOROETH EX AERO: 1.0000 "application " | INHALATION_SPRAY | CUTANEOUS | Status: DC | PRN

## 2020-03-20 MED ORDER — HEPARIN SODIUM (PORCINE) 1000 UNIT/ML IJ SOLN
INTRAMUSCULAR | Status: AC
  Administered 2020-03-20: 3800 [IU] via INTRAVENOUS_CENTRAL
  Filled 2020-03-20: qty 4

## 2020-03-20 MED ORDER — SODIUM CHLORIDE 0.9 % IV SOLN: 100.0000 mL | INTRAVENOUS | Status: DC | PRN

## 2020-03-20 MED ORDER — LIDOCAINE-PRILOCAINE 2.5-2.5 % EX CREA: 1.0000 "application " | TOPICAL_CREAM | CUTANEOUS | Status: DC | PRN

## 2020-03-20 MED ORDER — HEPARIN SODIUM (PORCINE) 1000 UNIT/ML DIALYSIS: 1000.0000 [IU] | INTRAMUSCULAR | Status: DC | PRN

## 2020-03-20 NOTE — Progress Notes (Signed)
PROGRESS NOTE        PATIENT DETAILS Name: Bailey Cox Age: 74 y.o. Sex: female Date of Birth: 1946/09/27 Admit Date: 01/05/2020 Admitting Physician Kandis Cocking, MD [id number], No Pcp Per  Brief Narrative: 74 year old female with history of cholecystitis status post cholecystectomy in 9/20 at [facility]. She was discharged to SNF but rehospitalized to [facility] and had bile duct stent. Discharged back to SNF but readmitted with Ogilive syndrome. Hospital course complicated by fluid overload and transferred to [facility] for dialysis. At [facility], developed respiratory failure requiring vent support. She also suffered a cardiac arrest during EGD. She underwent trach,she was eventually discharged to [facility] on 3/1 after prolonged hospitalization at [facility] but deemed too sick, and sent to [facility] due to fluid overload and acidosis.  Patient was initially admitted to ICU. Required CRRT 3/9-3/12. Transfer to floor on 3/14. She pulled her trach. Now on oxygen by nasal cannula. She is on Intermitted HD via [place]. Has refused dialysis at times but deemed to lack capacity. Family wants full scope of care including full code and dialysis. She has been noncompliant with care and hemodialysis but lacks capacity. Ethics committee meeting held on 4/6. The plan is to continue full scope of care per family's wish. Finally shetolerated HD in recliner chair on 4/19 without sedating medications. She will be discharged to SNF once she had outpatient HD spot andcontinues to tolerate HD on recliner chair.  She's been started on lactulose for elevated ammonia with concern for hepatic encephalopathy as well as nightly bipap for acute hypercapneic resp failure with altered mental status.    Subjective:  Patient in bed having HD, appears comfortable, denies any headache, no fever, no chest pain or pressure, no shortness of breath , no abdominal pain. No focal  weakness.   Assessment/Plan:  Acute metabolic encephalopathy: Multifactorial etiology-including AKI, hypercarbia, hospital/ICU delirium, hyperammonemia, pneumonia.  Encephalopathy has improved.  TSH/vitamin B12 within normal limits-RPR nonreactive.  Continue supportive care.  Septic shock secondary to UTI and PNA: Sepsis physiology has resolved-has completed a course of antimicrobial therapy.  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  Now refused nighttime BiPAP for a week and has done fairly well, continue only oxygen at night 2 L and monitor.  Avoid sedating medications like narcotics/benzodiazepines as much as possible.  Pseudomonas aeruginosa/aspiration pneumonia with right-sided parapneumonic effusion: Has completed multiple courses of antibiotics-see below.  Patient is s/p  thoracocentesis by PCCM on 4/22-plans are to continue with Rocephin until 5/1.  Maintain aspiration precautions.  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  Noncompliant with HD but lacks capacity.  Essential hypertension: BP controlled-not on scheduled antihypertensives-remains on as needed metoprolol.    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  Hemoglobin stable-follow periodically.   Anxiety/depression/agitation: Stable this morning-calm-quiet and answered most of my questions appropriately.  Continue Paxil and BuSpar.  Maintain delirium precautions.  Due to lethargy/sedation-no longer on Haldol and Seroquel. Deemed to lack capacity by psych.  GERD: Continue PPI, single dose Maalox given on 03/17/2020 for heartburn.  Generalized weakness/debility: Secondary to acute illness-Per prior note--not able to participate in therapy. Requiring mechanical lift to chair.  Chronic pain:-Scheduled Tylenol 1000  mg 3 times daily.  No  longer onoxycodone and reduced IV fentanyl due to AMS.  Goals of care/Ethical issues - complex medical issue with guarded prognosis. Patient refused HD previously but deemed to lack capacity by psych. Daughter wishes her to be full code with full scope of care including dialysis.Continued on HD with the help of medications and family.Palliative care following.Ethics involved previously.  Thrombocytopenia: mild - monitor.  Lab Results  Component Value Date   PLT 148 (L) 03/18/2020   DM-2 with hyperglycemia ( A1c 5.7%): No further hypoglycemic episodes-continue Lantus 8 units and SSI.    Lab Results  Component Value Date   HGBA1C 5.7 (H) 01/06/2020   CBG (last 3)  Recent Labs    03/19/20 2346 03/20/20 0409 03/20/20 0740  GLUCAP 131* 115* 144*     Nutrition Problem: Nutrition Problem: Inadequate oral intake Etiology: poor appetite, acute illness Signs/Symptoms: meal completion < 25% Interventions: Tube feeding, Nepro shake  Obesity: Estimated body mass index is 32.01 kg/m as calculated from the following:   Height as of this encounter: '5\' 4"'$  (1.626 m).   Weight as of this encounter: 84.6 kg.   DVT prophylaxis: SQ heparin  Diet: Diet Order            DIET DYS 2 Room service appropriate? No; Fluid consistency: Thin  Diet effective now               Code Status:  Full code   Family Communication:  Spoke to daughter Katharine Look [phone] on 03/15/2020  Procedures :  3/5 IJ vas cath-> 4/22 right-sided thoracocentesis>>  Significant studies:  Echo 10/24/2019 > LVEF 55%, Grade 1 DD. RV systolic function normal.  Echo 3/2: LVEF 65-70%, grade II dysfunction. RVSP  Consults:  Nephrology PCCM Psychiatry Palliative care Interventional radiology Ethics  Disposition Plan:  SNF when ready for discharge-awaiting bed.  Status is: Inpatient  Remains inpatient appropriate because:Inpatient level of care appropriate due to severity of  illness   Dispo: The patient is from: [facility]              Anticipated d/c is to: SNF              Anticipated d/c date is: Medically now ready for discharge              Patient currently is medically stable to d/c.  Has SNF bed in [place]-and have been told they can potentially take her on 03/21/2020 will try to discharge.   Antimicrobial agents:  Anti-infectives (From admission, onward)   Start     Dose/Rate Route Frequency Ordered Stop   02/28/20 1500  cefTRIAXone (ROCEPHIN) 2 g in sodium chloride 0.9 % 100 mL IVPB     2 g 200 mL/hr over 30 Minutes Intravenous Every 24 hours 02/28/20 1404 03/05/20 2359   02/26/20 1300  cefTRIAXone (ROCEPHIN) 1 g in sodium chloride 0.9 % 100 mL IVPB  Status:  Discontinued     1 g 200 mL/hr over 30 Minutes Intravenous Daily 02/26/20 1201 02/27/20 0930   02/16/20 1230  cefTRIAXone (ROCEPHIN) 2 g in sodium chloride 0.9 % 100 mL IVPB     2 g 200 mL/hr over 30 Minutes Intravenous Every 24 hours 02/16/20 1127 02/20/20 0941   02/16/20 1230  azithromycin (ZITHROMAX) 500 mg in sodium chloride 0.9 % 250 mL IVPB     500 mg 250 mL/hr over 60 Minutes Intravenous Every 24 hours 02/16/20 1127 02/20/20 1140   01/10/20 0930  vancomycin (VANCOCIN) IVPB 1000 mg/200 mL premix  Status:  Discontinued     1,000 mg 200 mL/hr over 60 Minutes Intravenous  Once 01/10/20 0915 01/13/20 0623   01/06/20 2200  ceFEPIme (MAXIPIME) 2 g in sodium chloride 0.9 % 100 mL IVPB     2 g 200 mL/hr over 30 Minutes Intravenous Every 24 hours 01/05/20 2251 01/11/20 2234   01/05/20 2200  ceFEPIme (MAXIPIME) 2 g in sodium chloride 0.9 % 100 mL IVPB     2 g 200 mL/hr over 30 Minutes Intravenous  Once 01/05/20 2148 01/06/20 0015   01/05/20 2200  metroNIDAZOLE (FLAGYL) IVPB 500 mg     500 mg 100 mL/hr over 60 Minutes Intravenous  Once 01/05/20 2148 01/06/20 0015   01/05/20 2200  vancomycin (VANCOCIN) IVPB 1000 mg/200 mL premix  Status:  Discontinued     1,000 mg 200 mL/hr over 60  Minutes Intravenous  Once 01/05/20 2148 01/05/20 2151   01/05/20 2200  vancomycin (VANCOREADY) IVPB 2000 mg/400 mL  Status:  Discontinued     2,000 mg 200 mL/hr over 120 Minutes Intravenous  Once 01/05/20 2151 01/05/20 2223       Time spent: 25 minutes-Greater than 50% of this time was spent in counseling, explanation of diagnosis, planning of further management, and coordination of care.  MEDICATIONS: Scheduled Meds: . acetaminophen (TYLENOL) oral liquid 160 mg/5 mL  1,000 mg Per Tube Q8H  . amLODipine  10 mg Per Tube Daily  . busPIRone  5 mg Per Tube BID  . calcium acetate  667 mg Oral TID WC  . camphor-menthol   Topical QID  . chlorhexidine gluconate (MEDLINE KIT)  15 mL Mouth Rinse BID  . Chlorhexidine Gluconate Cloth  6 each Topical Q0600  . darbepoetin (ARANESP) injection - DIALYSIS  200 mcg Subcutaneous Q Wed-1800  . feeding supplement (PRO-STAT SUGAR FREE 64)  30 mL Per Tube BID  . heparin injection (subcutaneous)  5,000 Units Subcutaneous Q8H  . insulin aspart  0-9 Units Subcutaneous Q4H  . insulin glargine  8 Units Subcutaneous QHS  . lactulose  30 g Per Tube BID  . pantoprazole sodium  40 mg Per Tube BID  . PARoxetine  40 mg Per Tube Daily  . sodium chloride flush  10-40 mL Intracatheter Q12H   Continuous Infusions: . sodium chloride    . sodium chloride    . feeding supplement (JEVITY 1.5 CAL/FIBER) 1,000 mL (03/19/20 2305)   PRN Meds:.sodium chloride, sodium chloride, alteplase, alum & mag hydroxide-simeth, feeding supplement (NEPRO CARB STEADY), guaiFENesin, heparin, lidocaine (PF), lidocaine-prilocaine, metoprolol tartrate, ondansetron (ZOFRAN) IV, pentafluoroprop-tetrafluoroeth, phenol   PHYSICAL EXAM: Vital signs: Vitals:   03/20/20 0000 03/20/20 0400 03/20/20 0450 03/20/20 0742  BP: (!) 142/55   131/61  Pulse:  76  83  Resp:    (!) 28  Temp: 98.3 F (36.8 C) 97.8 F (36.6 C)  97.8 F (36.6 C)  TempSrc: Axillary Axillary  Oral  SpO2:  100%  100%    Weight:   84.6 kg   Height:       Filed Weights   03/18/20 0730 03/18/20 1112 03/20/20 0450  Weight: 89 kg 85.3 kg 84.6 kg   Body mass index is 32.01 kg/m.   EXAM  Awake Alert, No new F.N deficits,  PEG in place Sharon.AT,PERRAL Supple Neck,No JVD, No cervical lymphadenopathy appriciated.  Symmetrical Chest wall movement, Good air movement bilaterally, CTAB RRR,No Gallops, Rubs or new Murmurs, No Parasternal Heave +ve B.Sounds, Abd Soft, No tenderness, No organomegaly appriciated, No rebound -  guarding or rigidity. No Cyanosis, Clubbing or edema, No new Rash or bruise   I have personally reviewed following labs and imaging studies  LABORATORY DATA: CBC: Recent Labs  Lab 03/15/20 0218 03/16/20 0333 03/16/20 2036 03/17/20 0359 03/18/20 0547  WBC 6.3 7.3 6.3 6.1 5.4  NEUTROABS 4.5 5.6  --  4.7 4.1  HGB 8.7* 9.3* 8.7* 8.8* 8.5*  HCT 32.2* 34.5* 32.3* 32.2* 31.3*  MCV 97.9 100.0 99.1 100.0 98.7  PLT 165 182 149* 148* 148*    Basic Metabolic Panel: Recent Labs  Lab 03/14/20 0511 03/14/20 0511 03/14/20 1006 03/15/20 0218 03/16/20 0333 03/16/20 2036 03/17/20 0359 03/18/20 0547  NA 134*   < >  --  134* 137 133* 135 133*  K 3.8   < >  --  3.5 3.4* 3.5 3.8 3.6  CL 94*   < >  --  94* 94* 94* 93* 93*  CO2 27   < >  --  26 32 '30 28 29  '$ GLUCOSE 129*   < >  --  58* 118* 121* 125* 130*  BUN 51*   < >  --  71* 42* 54* 62* 83*  CREATININE 1.60*   < >  --  1.85* 1.27* 1.63* 1.75* 2.26*  CALCIUM 9.7   < >  --  9.8 9.0 9.1 9.6 10.0  MG  --   --  2.4 2.4 2.3  --  2.5* 2.6*  PHOS 4.7*  --   --   --   --  4.0  --   --    < > = values in this interval not displayed.    GFR: Estimated Creatinine Clearance: 24 mL/min (A) (by C-G formula based on SCr of 2.26 mg/dL (H)).  Liver Function Tests: Recent Labs  Lab 03/13/20 1235 03/14/20 0511 03/15/20 0218 03/16/20 0333 03/16/20 2036 03/17/20 0359 03/18/20 0547  AST 30  --  27 28  --  27 23  ALT 19  --  19 20  --  20 18   ALKPHOS 201*  --  207* 208*  --  192* 187*  BILITOT 0.1*  --  0.2* 0.3  --  0.5 0.5  PROT 6.4*  --  6.1* 6.6  --  6.3* 5.9*  ALBUMIN 2.5*   < > 2.4* 2.5* 2.3* 2.3* 2.3*   < > = values in this interval not displayed.   No results for input(s): LIPASE, AMYLASE in the last 168 hours. Recent Labs  Lab 03/14/20 1006 03/15/20 1432 03/16/20 0333 03/17/20 0359 03/18/20 0547  AMMONIA 43* 69* 65* 47* 52*    Coagulation Profile: No results for input(s): INR, PROTIME in the last 168 hours.  Cardiac Enzymes: No results for input(s): CKTOTAL, CKMB, CKMBINDEX, TROPONINI in the last 168 hours.  BNP (last 3 results) No results for input(s): PROBNP in the last 8760 hours.  Lipid Profile: No results for input(s): CHOL, HDL, LDLCALC, TRIG, CHOLHDL, LDLDIRECT in the last 72 hours.  Thyroid Function Tests: No results for input(s): TSH, T4TOTAL, FREET4, T3FREE, THYROIDAB in the last 72 hours.  Anemia Panel: No results for input(s): VITAMINB12, FOLATE, FERRITIN, TIBC, IRON, RETICCTPCT in the last 72 hours.  Urine analysis:    Component Value Date/Time   COLORURINE YELLOW 02/25/2020 1600   APPEARANCEUR TURBID (A) 02/25/2020 1600   LABSPEC 1.021 02/25/2020 1600   PHURINE 5.0 02/25/2020 1600   GLUCOSEU NEGATIVE 02/25/2020 1600   HGBUR MODERATE (A) 02/25/2020 1600   BILIRUBINUR NEGATIVE 02/25/2020 1600  KETONESUR NEGATIVE 02/25/2020 1600   PROTEINUR 100 (A) 02/25/2020 1600   NITRITE NEGATIVE 02/25/2020 1600   LEUKOCYTESUR MODERATE (A) 02/25/2020 1600    Sepsis Labs: Lactic Acid, Venous    Component Value Date/Time   LATICACIDVEN 0.7 02/24/2020 1823    MICROBIOLOGY: No results found for this or any previous visit (from the past 240 hour(s)).  RADIOLOGY STUDIES/RESULTS: No results found.   LOS: 75 days   Signature  Lala Lund M.D on 03/20/2020 at 8:41 AM   -  To page go to [email or web address]

## 2020-03-20 NOTE — Progress Notes (Signed)
Patient ID: Bailey Cox, female   DOB: Aug 15, 1946, 74 y.o.   MRN: [id number]   S: Patient expresses no complaints today.  Tolerating dialysis   O:BP (!) 122/56   Pulse 76   Temp 97.6 F (36.4 C) (Oral)   Resp (!) 21   Ht '5\' 4"'$  (1.626 m)   Wt 84.6 kg   LMP  (LMP Unknown)   SpO2 100%   BMI 32.01 kg/m   Intake/Output Summary (Last 24 hours) at 03/20/2020 1218 Last data filed at 03/20/2020 0531 Gross per 24 hour  Intake 550 ml  Output 300 ml  Net 250 ml   Intake/Output: I/O last 3 completed shifts: In: 2488.7 [NG/GT:2488.7] Out: 300 [Urine:300]  Intake/Output this shift:  No intake/output data recorded. Weight change: -4.4 kg Gen: NAD lying in bed CVS: Normal rate, systolic murmur present Resp: No increased work of breathing, bilateral chest rise Abd: +BS, soft, NT/ND Ext: trace pretibial edema  Recent Labs  Lab 03/13/20 1235 03/13/20 1235 03/14/20 0511 03/15/20 0218 03/16/20 0333 03/16/20 2036 03/17/20 0359 03/18/20 0547 03/20/20 0837  NA 133*   < > 134* 134* 137 133* 135 133* 139  K 3.5   < > 3.8 3.5 3.4* 3.5 3.8 3.6 4.1  CL 91*   < > 94* 94* 94* 94* 93* 93* 97*  CO2 27   < > 27 26 32 '30 28 29 29  '$ GLUCOSE 127*   < > 129* 58* 118* 121* 125* 130* 152*  BUN 36*   < > 51* 71* 42* 54* 62* 83* 69*  CREATININE 1.36*   < > 1.60* 1.85* 1.27* 1.63* 1.75* 2.26* 1.93*  ALBUMIN 2.5*   < > 2.5* 2.4* 2.5* 2.3* 2.3* 2.3* 2.6*  CALCIUM 9.2   < > 9.7 9.8 9.0 9.1 9.6 10.0 10.1  PHOS  --   --  4.7*  --   --  4.0  --   --  5.3*  AST 30  --   --  27 28  --  27 23  --   ALT 19  --   --  19 20  --  20 18  --    < > = values in this interval not displayed.   Liver Function Tests: Recent Labs  Lab 03/16/20 0333 03/16/20 2036 03/17/20 0359 03/18/20 0547 03/20/20 0837  AST 28  --  27 23  --   ALT 20  --  20 18  --   ALKPHOS 208*  --  192* 187*  --   BILITOT 0.3  --  0.5 0.5  --   PROT 6.6  --  6.3* 5.9*  --   ALBUMIN 2.5*   < > 2.3* 2.3* 2.6*   < > = values in this  interval not displayed.   No results for input(s): LIPASE, AMYLASE in the last 168 hours. Recent Labs  Lab 03/16/20 0333 03/17/20 0359 03/18/20 0547  AMMONIA 65* 47* 52*   CBC: Recent Labs  Lab 03/16/20 0333 03/16/20 0333 03/16/20 2036 03/16/20 2036 03/17/20 0359 03/18/20 0547 03/20/20 0838  WBC 7.3   < > 6.3   < > 6.1 5.4 7.2  NEUTROABS 5.6  --   --   --  4.7 4.1  --   HGB 9.3*   < > 8.7*   < > 8.8* 8.5* 9.4*  HCT 34.5*   < > 32.3*   < > 32.2* 31.3* 35.6*  MCV 100.0  --  99.1  --  100.0 98.7 102.9*  PLT 182   < > 149*   < > 148* 148* 164   < > = values in this interval not displayed.   Cardiac Enzymes: No results for input(s): CKTOTAL, CKMB, CKMBINDEX, TROPONINI in the last 168 hours. CBG: Recent Labs  Lab 03/19/20 1711 03/19/20 2022 03/19/20 2346 03/20/20 0409 03/20/20 0740  GLUCAP 128* 125* 131* 115* 144*    Iron Studies: No results for input(s): IRON, TIBC, TRANSFERRIN, FERRITIN in the last 72 hours. Studies/Results: No results found. Marland Kitchen acetaminophen (TYLENOL) oral liquid 160 mg/5 mL  1,000 mg Per Tube Q8H  . amLODipine  10 mg Per Tube Daily  . busPIRone  5 mg Per Tube BID  . calcium acetate  667 mg Oral TID WC  . camphor-menthol   Topical QID  . chlorhexidine gluconate (MEDLINE KIT)  15 mL Mouth Rinse BID  . Chlorhexidine Gluconate Cloth  6 each Topical Q0600  . darbepoetin (ARANESP) injection - DIALYSIS  200 mcg Subcutaneous Q Wed-1800  . feeding supplement (PRO-STAT SUGAR FREE 64)  30 mL Per Tube BID  . heparin injection (subcutaneous)  5,000 Units Subcutaneous Q8H  . insulin aspart  0-9 Units Subcutaneous Q4H  . insulin glargine  8 Units Subcutaneous QHS  . lactulose  30 g Per Tube BID  . pantoprazole sodium  40 mg Per Tube BID  . PARoxetine  40 mg Per Tube Daily  . sodium chloride flush  10-40 mL Intracatheter Q12H    BMET    Component Value Date/Time   NA 139 03/20/2020 0837   K 4.1 03/20/2020 0837   CL 97 (L) 03/20/2020 0837   CO2 29  03/20/2020 0837   GLUCOSE 152 (H) 03/20/2020 0837   BUN 69 (H) 03/20/2020 0837   CREATININE 1.93 (H) 03/20/2020 0837   CALCIUM 10.1 03/20/2020 0837   GFRNONAA 26 (L) 03/20/2020 0837   GFRAA 30 (L) 03/20/2020 0837   CBC    Component Value Date/Time   WBC 7.2 03/20/2020 0838   RBC 3.46 (L) 03/20/2020 0838   HGB 9.4 (L) 03/20/2020 0838   HCT 35.6 (L) 03/20/2020 0838   PLT 164 03/20/2020 0838   MCV 102.9 (H) 03/20/2020 0838   MCH 27.2 03/20/2020 0838   MCHC 26.4 (L) 03/20/2020 0838   RDW 17.1 (H) 03/20/2020 0838   LYMPHSABS 0.6 (L) 03/18/2020 0547   MONOABS 0.5 03/18/2020 0547   EOSABS 0.1 03/18/2020 0547   BASOSABS 0.1 03/18/2020 0547    Assessment/Plan:  1. New ESRD in setting of OliguricAKI/CKD stage 3- due to ischemic ATN in setting of sepsis. She has remained dialysis dependent since 01/09/20 and will require ongoing IHD. Now on MWF schedule for outpatient HD at [facility], [place]- has been accepted there.  1. Using TDC 2. Tolerating HD in recliner 3. Remains oliguric with rapidly rising BUN. Small changes in serum creatinine likely due to low muscle mass 4. Continue with MWF schedule- will do M/Th/Sat this week due to high HD volume-  Resume MWF on Monday.  Planning to discharge on Sunday to start outpatient on Monday 2. AMS- continues to wax and wane.  Started on lactulose for elevated ammonia and bipap nightly for acute hypercapneic resp failure.  Stable today 3. Anemia of CKD- on high dose ESA 4. H/o PEA arrest 5. HFpEF-  volume overload. Will UF as tolerated  6. DM- per primary 7. 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 Was on trach but  decannulated on 01/23/20. Continue with BiPAP nightly. 8. Bones-  Phos is good on phoslo-  PTH 59 at last check - no meds 9. Disposition- for discharge to SNF and arranged for outpatient HD in [place], [place], planning to start outpatient on Monday and discharged  tomorrow  [facility] [phone]

## 2020-03-20 NOTE — TOC Progression Note (Signed)
Transition of Care [facility]) - Progression Note    Patient Details  Name: Bailey Cox MRN: [id number] Date of Birth: Jun 02, 1946  Transition of Care [facility]) CM/SW [place], [place] Phone Number: 03/20/2020, 10:28 AM  Clinical Narrative:    Dietician has updated recs for tube feeds and CSW has contacted pt RN John about obtaining COVID swab when pt returns from dialysis. He is aware we need this for dc tomorrow and he will collect lab this afternoon.   Expected Discharge Plan: Skilled Nursing Facility Barriers to Discharge: Continued Medical Work up, Other (comment)(ability to tolerate dialysis in recliner)  Expected Discharge Plan and Services Expected Discharge Plan: [place] In-house Referral: Clinical Social Work Discharge Planning Services: CM Consult Post Acute Care Choice: [place] Living arrangements for the past 2 months: (Has been hospitalized for over 4 months)  Readmission Risk Interventions Readmission Risk Prevention Plan 01/28/2020  Transportation Screening Complete  PCP or Specialist Appt within 3-5 Days Not Complete  Not Complete comments plan for SNF  HRI or [place] Not Complete  HRI or Home Care Consult comments plan for SNF  Social Work Consult for [place] Planning/Counseling Complete  Palliative Care Screening Complete  Medication Review Press photographer) Referral to Pharmacy  PCP or Specialist appointment within 3-5 days of discharge Not Complete  PCP/Specialist Appt Not Complete comments plan for SNF  HRI or Home Care Consult Complete  SW Recovery Care/Counseling Consult Complete  Palliative Care Screening Not Complete  Comments appropriate at this time  [place] Complete

## 2020-03-21 LAB — GLUCOSE, CAPILLARY
Glucose-Capillary: 110 mg/dL — ABNORMAL HIGH (ref 70–99)
Glucose-Capillary: 139 mg/dL — ABNORMAL HIGH (ref 70–99)
Glucose-Capillary: 146 mg/dL — ABNORMAL HIGH (ref 70–99)
Glucose-Capillary: 156 mg/dL — ABNORMAL HIGH (ref 70–99)

## 2020-03-21 MED ORDER — CALCIUM ACETATE (PHOS BINDER) 667 MG PO CAPS: 667.0000 mg | ORAL_CAPSULE | Freq: Three times a day (TID) | ORAL | Status: AC

## 2020-03-21 MED ORDER — INSULIN GLARGINE 100 UNIT/ML ~~LOC~~ SOLN: 8.0000 [IU] | Freq: Every day | SUBCUTANEOUS | 0 refills | Status: AC

## 2020-03-21 MED ORDER — AMLODIPINE BESYLATE 10 MG PO TABS: 10.0000 mg | ORAL_TABLET | Freq: Every day | ORAL | Status: AC

## 2020-03-21 MED ORDER — JEVITY 1.5 CAL/FIBER PO LIQD: 1000.0000 mL | ORAL | Status: AC

## 2020-03-21 MED ORDER — METOPROLOL TARTRATE 25 MG PO TABS: 12.5000 mg | ORAL_TABLET | Freq: Two times a day (BID) | ORAL | Status: AC

## 2020-03-21 MED ORDER — NEPRO/CARBSTEADY PO LIQD: 237.0000 mL | ORAL | 0 refills | Status: AC | PRN

## 2020-03-21 MED ORDER — CENTAMIN PO LIQD: 15.0000 mL | Freq: Every day | ORAL | 0 refills | Status: AC

## 2020-03-21 MED ORDER — POLYETHYLENE GLYCOL 3350 17 G PO PACK: 17.0000 g | PACK | Freq: Every day | ORAL | 0 refills | Status: AC

## 2020-03-21 MED ORDER — BUSPIRONE HCL 5 MG PO TABS: 5.0000 mg | ORAL_TABLET | Freq: Two times a day (BID) | ORAL | Status: AC

## 2020-03-21 MED ORDER — PANTOPRAZOLE SODIUM 40 MG PO PACK: 40.0000 mg | PACK | Freq: Two times a day (BID) | ORAL | Status: AC

## 2020-03-21 MED ORDER — INSULIN ASPART 100 UNIT/ML ~~LOC~~ SOLN: SUBCUTANEOUS | 0 refills | Status: AC

## 2020-03-21 MED ORDER — LACTULOSE 10 GM/15ML PO SOLN: 30.0000 g | Freq: Two times a day (BID) | ORAL | 0 refills | Status: AC

## 2020-03-21 NOTE — Discharge Instructions (Signed)
Follow with Primary MD Patient, No Pcp Per in 7 days   Get CBC, CMP, 2 view Chest X ray -  checked next visit within 1 week by Primary MD or SNF MD    Activity: As tolerated with Full fall precautions use walker/cane & assistance as needed  Disposition SNF  Diet:   On tube feeds via PEG tube refuses to eat, continue speech eval at SNF  Special Instructions: If you have smoked or chewed Tobacco  in the last 2 yrs please stop smoking, stop any regular Alcohol  and or any Recreational drug use.  On your next visit with your primary care physician please Get Medicines reviewed and adjusted.  Please request your Prim.MD to go over all Hospital Tests and Procedure/Radiological results at the follow up, please get all Hospital records sent to your Prim MD by signing hospital release before you go home.  If you experience worsening of your admission symptoms, develop shortness of breath, life threatening emergency, suicidal or homicidal thoughts you must seek medical attention immediately by calling 911 or calling your MD immediately  if symptoms less severe.  You Must read complete instructions/literature along with all the possible adverse reactions/side effects for all the Medicines you take and that have been prescribed to you. Take any new Medicines after you have completely understood and accpet all the possible adverse reactions/side effects.

## 2020-03-21 NOTE — Progress Notes (Signed)
   03/21/20 1043  AVS Discharge Documentation  AVS Discharge Instructions Including Medications Placed in discharge packet for receiving facility;Provided to patient/caregiver  Name of Person Receiving AVS Discharge Instructions Including Medications Tammy, RN at [phone]  Name of Clinician That Reviewed AVS Discharge Instructions Including Medications Venida Jarvis, RN

## 2020-03-21 NOTE — Discharge Summary (Signed)
Bailey Cox PYP:[id number] DOB: [birth year] DOA: 01/05/2020  PCP: Patient, No Pcp Per  Admit date: 01/05/2020  Discharge date: 03/21/2020  Admitted From: Home   Disposition:  SNF   Recommendations for Outpatient Follow-up:   Follow up with PCP in 1-2 weeks  PCP Please obtain BMP/CBC, 2 view CXR in 1week,  (see Discharge instructions)   PCP Please follow up on the following pending results:    Home Health: None   Equipment/Devices: None   Consultations:  Nephrology PCCM Psychiatry Palliative care Interventional radiology Ethics  Discharge Condition: Fair   CODE STATUS: Full    Diet Recommendation: Tube feeds, refuses to eat by mouth, potentially can eat with the diet assistance in feeding precautions, continue speech eval at SNF.  Diet Order            DIET DYS 2 Room service appropriate? No; Fluid consistency: Thin  Diet effective now               Chief Complaint  Patient presents with  . Abnormal Lab  . Leg Swelling     Brief history of present illness from the day of admission and additional interim summary    74 year old female with history of cholecystitis status post cholecystectomy in 9/20 at [facility]. She was discharged to SNF but rehospitalized to [facility] and had bile duct stent. Discharged back to SNF but readmitted with Ogilive syndrome. Hospital course complicated by fluid overload and transferred to [facility] for dialysis. At [facility], developed respiratory failure requiring vent support. She also suffered a cardiac arrest during EGD. She underwent trach,she was eventually discharged to [facility] on 3/1 after prolonged hospitalization at [facility] but deemed too sick, and sent to [facility] due to fluid overload and acidosis.  Patient was initially admitted to ICU.  Required CRRT 3/9-3/12. Transfer to floor on 3/14. She pulled her trach. Now on oxygen by nasal cannula. She is on Intermitted HD via [place]. Has refused dialysis at times but deemed to lack capacity. Family wants full scope of care including full code and dialysis. She has been noncompliant with care and hemodialysis but lacks capacity. Ethics committee meeting held on 4/6.   She is now on tube feeds, undergoing regular HD treatments, stable off of BiPAP at night for several days as she refuses to wear it.  Mental status is close to baseline, she will converse and follow basic commands when she wants to.  In no distress will be discharged to SNF.                                                                    Hospital Course    Acute metabolic encephalopathy: Multifactorial etiology-including AKI, hypercarbia, hospital/ICU delirium, hyperammonemia, pneumonia.  Encephalopathy has improved.  TSH/vitamin B12 within normal  limits-RPR nonreactive.  Mentation is now back to or at least close to baseline, she will benefit from continued psych input in the outpatient setting along with speech therapist.  She still refuses to eat, note she has been deemed not to have capacity to decide for herself by ethics committee and psych during this hospitalization.  Septic shock secondary to UTI and PNA: Sepsis physiology has resolved-has completed a course of antimicrobial therapy.  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   Stable on 2 L nasal cannula oxygen, ideally we had recommended BiPAP at nighttime but she has not worn it for 5-7 days without any problems.  Continue 2 L nasal cannula oxygen at night.  Pseudomonas aeruginosa/aspiration pneumonia with right-sided parapneumonic effusion: Has completed multiple courses of antibiotics-see below.  Patient is s/p  thoracocentesis by  PCCM on 4/22-plans are to continue with Rocephin until 5/1.  Maintain aspiration precautions.  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  Now close to ESRD status, currently on TTS schedule.  Essential hypertension: BP controlled-not on scheduled antihypertensives-remains on as needed metoprolol.    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  Hemoglobin stable-follow periodically.   Anxiety/depression/agitation: Stable this morning-calm-quiet and answered most of my questions appropriately.  Continue Paxil and BuSpar.  Maintain delirium precautions.  Due to lethargy/sedation-no longer on Haldol and Seroquel. Deemed to lack capacity by psych.  GERD: Continue PPI, single dose Maalox given on 03/17/2020 for heartburn.  Generalized weakness/debility: Secondary to acute illness-Per prior note--not able to participate in therapy. Requiring mechanical lift to chair.  Chronic pain:-Scheduled Tylenol 1000 mg 3 times daily.  No longer onoxycodone and reduced IV fentanyl due to AMS.  Goals of care/Ethical issues - complex medical issue with guarded prognosis. Patient refused HD previously but deemed to lack capacity by psych. Daughter wishes her to be full code with full scope of care including dialysis.Continued on HD with the help of medications and family.Palliative care following.Ethics involved previously.  Thrombocytopenia: mild - monitor.    DM-2 with hyperglycemia ( A1c 5.7%): On Lantus along with every 6 hours Accu-Cheks and sliding scale.  Lab Results  Component Value Date   HGBA1C 5.7 (H) 01/06/2020      Discharge diagnosis     Active Problems:   [facility])   Renal failure   Tracheostomy status ([place])   Chronic hypoxemic respiratory failure (HCC)   Hypotension   Sepsis ([place])   Acute hypoxemic respiratory failure (HCC)   Acute kidney injury ([place])   Anemia due to stage 4 chronic  kidney disease (HCC)   Abdominal pain   Goals of care, counseling/discussion   Depression, recurrent ([place])   Palliative care by specialist   Weakness   Pressure injury of skin   Acute metabolic encephalopathy   Pleural effusion   Acute respiratory failure with hypoxia and hypercapnia ([place])   Abnormal CXR    Discharge instructions    Discharge Instructions    Discharge instructions   Complete by: As directed    Follow with Primary MD Patient, No Pcp Per in 7 days   Get CBC, CMP, 2 view Chest X ray -  checked next visit within 1 week by Primary MD or SNF MD    Activity: As tolerated with Full fall precautions use walker/cane & assistance as needed  Disposition SNF  Diet:   On tube  feeds via PEG tube refuses to eat, continue speech eval at SNF  Special Instructions: If you have smoked or chewed Tobacco  in the last 2 yrs please stop smoking, stop any regular Alcohol  and or any Recreational drug use.  On your next visit with your primary care physician please Get Medicines reviewed and adjusted.  Please request your Prim.MD to go over all Hospital Tests and Procedure/Radiological results at the follow up, please get all Hospital records sent to your Prim MD by signing hospital release before you go home.  If you experience worsening of your admission symptoms, develop shortness of breath, life threatening emergency, suicidal or homicidal thoughts you must seek medical attention immediately by calling 911 or calling your MD immediately  if symptoms less severe.  You Must read complete instructions/literature along with all the possible adverse reactions/side effects for all the Medicines you take and that have been prescribed to you. Take any new Medicines after you have completely understood and accpet all the possible adverse reactions/side effects.   Increase activity slowly   Complete by: As directed       Discharge Medications   Allergies as of 03/21/2020      Reactions     Drug Class [clindamycin/lincomycin] Hives   Drug Ingredient [cephalexin] Hives   Pt has tolerated Ceftriaxone, Cefepime, and Meropenem    Nsaids Hives   Unsure of which agents   Penicillins Hives   Pt has tolerated Ceftriaxone, Cefepime, and Meropenem    Sulfa Antibiotics Hives   Dilaudid [hydromorphone Hcl] Other (See Comments)   Shortness of breath/confusion      Medication List    STOP taking these medications   acetylcysteine 20 % nebulizer solution Commonly known as: MUCOMYST   ALPRAZolam 0.25 MG tablet Commonly known as: XANAX   alum & mag hydroxide-simeth 400-400-40 MG/5ML suspension Commonly known as: MAALOX PLUS   esomeprazole 40 MG packet Commonly known as: NEXIUM   insulin lispro 100 UNIT/ML injection Commonly known as: HUMALOG   lidocaine 5 % Commonly known as: LIDODERM   nystatin 100000 UNIT/ML suspension Commonly known as: MYCOSTATIN   ondansetron 4 MG/2ML Soln injection Commonly known as: ZOFRAN   senna 8.6 MG Tabs tablet Commonly known as: SENOKOT   traMADol 50 MG tablet Commonly known as: ULTRAM     TAKE these medications   albuterol (2.5 MG/3ML) 0.083% nebulizer solution Commonly known as: PROVENTIL Take 2.5 mg by nebulization every 6 (six) hours.   amLODipine 10 MG tablet Commonly known as: NORVASC Place 1 tablet (10 mg total) into feeding tube daily. Start taking on: Mar 22, 2020   busPIRone 5 MG tablet Commonly known as: BUSPAR Place 1 tablet (5 mg total) into feeding tube 2 (two) times daily.   calcium acetate 667 MG capsule Commonly known as: PHOSLO Take 1 capsule (667 mg total) by mouth 3 (three) times daily with meals.   Centamin Liqd Place 15 mLs into feeding tube daily. What changed: how to take this   feeding supplement (NEPRO CARB STEADY) Liqd Place 237 mLs into feeding tube as needed (poor intake).   feeding supplement (JEVITY 1.5 CAL/FIBER) Liqd Place 1,000 mLs into feeding tube continuous. 55cc/hr   insulin  aspart 100 UNIT/ML injection Commonly known as: NovoLOG Substitute to any brand approved.Before each meal 3 times a day, 140-199 - 2 units, 200-250 - 4 units, 251-299 - 6 units,  300-349 - 8 units,  350 or above 10 units. Dispense syringes and needles as needed,  Ok to switch to PEN if approved. DX DM2, Code E11.65   insulin glargine 100 UNIT/ML injection Commonly known as: LANTUS Inject 0.08 mLs (8 Units total) into the skin at bedtime. What changed: how much to take   lactulose 10 GM/15ML solution Commonly known as: CHRONULAC Place 45 mLs (30 g total) into feeding tube 2 (two) times daily.   metoprolol tartrate 25 MG tablet Commonly known as: LOPRESSOR Place 0.5 tablets (12.5 mg total) into feeding tube 2 (two) times daily. What changed: how to take this   pantoprazole sodium 40 mg/20 mL Pack Commonly known as: PROTONIX Place 20 mLs (40 mg total) into feeding tube 2 (two) times daily.   PARoxetine 30 MG tablet Commonly known as: PAXIL Take 60 mg by mouth daily.   polyethylene glycol 17 g packet Commonly known as: MIRALAX / GLYCOLAX Place 17 g into feeding tube daily. What changed:   how to take this  when to take this  reasons to take this         Major procedures and Radiology Reports - PLEASE review detailed and final reports thoroughly  -      DG CHEST PORT 1 VIEW  Result Date: 02/27/2020 CLINICAL DATA:  Pleural effusion. EXAM: PORTABLE CHEST 1 VIEW COMPARISON:  02/26/2020 FINDINGS: The right IJ catheter is stable. The tip is in the right atrium. Stable mild cardiac enlargement. Mediastinal and hilar contours are stable. Persistent right effusion and overlying atelectasis. Persistent left basilar atelectasis. No new findings. IMPRESSION: Persistent right effusion and overlying atelectasis. Electronically Signed   By: Marijo Sanes M.D.   On: 02/27/2020 07:49   DG CHEST PORT 1 VIEW  Result Date: 02/26/2020 CLINICAL DATA:  Followup pleural effusion. EXAM: PORTABLE  CHEST 1 VIEW COMPARISON:  02/25/2020 and earlier studies. FINDINGS: Pleural fluid on the right appears mildly decreased. Linear opacities noted in the right mid lung consistent with atelectasis. Mild linear opacity noted at the left lung base, also likely atelectasis. Suspect small left pleural effusion. Mild vascular congestion and interstitial prominence is similar to the previous exam. Residual pleural fluid and lung base opacity on the right obscures most of the right heart border and the right hemidiaphragm. Right internal jugular dual-lumen central venous catheter is stable. No pneumothorax. IMPRESSION: 1. Mild decrease in the opacity at the right lung base consistent with a decrease in right pleural fluid. 2. No other change. Electronically Signed   By: Lajean Manes M.D.   On: 02/26/2020 17:23   DG CHEST PORT 1 VIEW  Result Date: 02/25/2020 CLINICAL DATA:  Abnormal chest x-ray EXAM: PORTABLE CHEST 1 VIEW COMPARISON:  02/18/2020 FINDINGS: Right-sided central venous catheter tip over the right atrium. Moderate to large right pleural effusion without significant change. Probable small left effusion. Enlarged cardiomediastinal silhouette. Slightly improved aeration at the left base with residual atelectasis or pneumonia. No change in dense right middle lobe and basilar consolidation. IMPRESSION: 1. Moderate to large right pleural effusion grossly unchanged with persistent dense airspace disease at the right middle lobe and right base 2. Probable small left pleural effusion. Slightly improved aeration at left base with residual atelectasis or pneumonia at the medial left base 3. Cardiomegaly Electronically Signed   By: Donavan Foil M.D.   On: 02/25/2020 16:49    Micro Results     Recent Results (from the past 240 hour(s))  SARS CORONAVIRUS 2 (TAT 6-24 HRS) Nasopharyngeal Nasopharyngeal Swab     Status: None   Collection Time: 03/20/20  3:18  PM   Specimen: Nasopharyngeal Swab  Result Value Ref Range  Status   SARS Coronavirus 2 NEGATIVE NEGATIVE Final    Comment: (NOTE) SARS-CoV-2 target nucleic acids are NOT DETECTED. The SARS-CoV-2 RNA is generally detectable in upper and lower respiratory specimens during the acute phase of infection. Negative results do not preclude SARS-CoV-2 infection, do not rule out co-infections with other pathogens, and should not be used as the sole basis for treatment or other patient management decisions. Negative results must be combined with clinical observations, patient history, and epidemiological information. The expected result is Negative. Fact Sheet for Patients: SugarRoll.be Fact Sheet for Healthcare Providers: https://www.woods-mathews.com/ This test is not yet approved or cleared by the [place] FDA and  has been authorized for detection and/or diagnosis of SARS-CoV-2 by FDA under an Emergency Use Authorization (EUA). This EUA will remain  in effect (meaning this test can be used) for the duration of the COVID-19 declaration under Section 56 4(b)(1) of the Act, 21 U.S.C. section 360bbb-3(b)(1), unless the authorization is terminated or revoked sooner. Performed at [facility], [place] [address] [place], [place] [address]     Today   Subjective    Bo Rogue today has no headache,no chest abdominal pain,no new weakness tingling or numbness, feels much better     Objective   Blood pressure (!) 149/60, pulse 76, temperature 97.8 F (36.6 C), temperature source Axillary, resp. rate 13, height 5\' 4"  (1.626 m), weight 85.9 kg, SpO2 98 %.   Intake/Output Summary (Last 24 hours) at 03/21/2020 0909 Last data filed at 03/21/2020 0500 Gross per 24 hour  Intake --  Output 1250 ml  Net -1250 ml    Exam  Awake Alert, No new F.N deficits, flat affect, PEG tube in place, [place].AT,PERRAL Supple Neck,No JVD, No cervical lymphadenopathy appriciated.  Symmetrical Chest wall movement,  Good air movement bilaterally, CTAB RRR,No Gallops,Rubs or new Murmurs, No Parasternal Heave +ve B.Sounds, Abd Soft, Non tender, No organomegaly appriciated, No rebound -guarding or rigidity. No Cyanosis, Clubbing or edema, No new Rash or bruise   Data Review   CBC w Diff:  Lab Results  Component Value Date   WBC 7.2 03/20/2020   HGB 9.4 (L) 03/20/2020   HCT 35.6 (L) 03/20/2020   PLT 164 03/20/2020   LYMPHOPCT 12 03/18/2020   MONOPCT 9 03/18/2020   EOSPCT 2 03/18/2020   BASOPCT 1 03/18/2020    CMP:  Lab Results  Component Value Date   NA 139 03/20/2020   K 4.1 03/20/2020   CL 97 (L) 03/20/2020   CO2 29 03/20/2020   BUN 69 (H) 03/20/2020   CREATININE 1.93 (H) 03/20/2020   PROT 5.9 (L) 03/18/2020   ALBUMIN 2.6 (L) 03/20/2020   BILITOT 0.5 03/18/2020   ALKPHOS 187 (H) 03/18/2020   AST 23 03/18/2020   ALT 18 03/18/2020  .   Total Time in preparing paper work, data evaluation and todays exam - 72 minutes  Lala Lund M.D on 03/21/2020 at 9:09 AM  [facility]   Office  [phone]

## 2020-03-21 NOTE — Progress Notes (Signed)
Patient ID: Bailey Cox, female   DOB: 06/06/1946, 74 y.o.   MRN: [id number]   S: Patient sleeping.  Does arouse to questioning but does not answer any questions.  No issues with dialysis yesterday.  1 L of ultrafiltration   O:BP (!) 139/58 (BP Location: Left Arm)   Pulse 80   Temp 97.6 F (36.4 C) (Axillary)   Resp (!) 23   Ht _0  (1.626 m)   Wt 85.9 kg   LMP  (LMP Unknown)   SpO2 99%   BMI 32.51 kg/m   Intake/Output Summary (Last 24 hours) at 03/21/2020 1126 Last data filed at 03/21/2020 0500 Gross per 24 hour  Intake --  Output 1250 ml  Net -1250 ml   Intake/Output: I/O last 3 completed shifts: In: 550 [NG/GT:550] Out: 1250 [Urine:250; Other:1000]  Intake/Output this shift:  No intake/output data recorded. Weight change: 0 kg Gen: NAD lying in bed CVS: Normal rate, systolic murmur present Resp: No increased work of breathing, bilateral chest rise Abd: +BS, soft, NT/ND Ext: trace pretibial edema  Recent Labs  Lab 03/15/20 0218 03/16/20 0333 03/16/20 2036 03/17/20 0359 03/18/20 0547 03/20/20 0837  NA 134* 137 133* 135 133* 139  K 3.5 3.4* 3.5 3.8 3.6 4.1  CL 94* 94* 94* 93* 93* 97*  CO2 26 32 _1 GLUCOSE 58* 118* 121* 125* 130* 152*  BUN 71* 42* 54* 62* 83* 69*  CREATININE 1.85* 1.27* 1.63* 1.75* 2.26* 1.93*  ALBUMIN 2.4* 2.5* 2.3* 2.3* 2.3* 2.6*  CALCIUM 9.8 9.0 9.1 9.6 10.0 10.1  PHOS  --   --  4.0  --   --  5.3*  AST 27 28  --  27 23  --   ALT 19 20  --  20 18  --    Liver Function Tests: Recent Labs  Lab 03/16/20 0333 03/16/20 2036 03/17/20 0359 03/18/20 0547 03/20/20 0837  AST 28  --  27 23  --   ALT 20  --  20 18  --   ALKPHOS 208*  --  192* 187*  --   BILITOT 0.3  --  0.5 0.5  --   PROT 6.6  --  6.3* 5.9*  --   ALBUMIN 2.5*   < > 2.3* 2.3* 2.6*   < > = values in this interval not displayed.   No results for input(s): LIPASE, AMYLASE in the last 168 hours. Recent Labs  Lab 03/16/20 0333 03/17/20 0359 03/18/20 0547   AMMONIA 65* 47* 52*   CBC: Recent Labs  Lab 03/16/20 0333 03/16/20 0333 03/16/20 2036 03/16/20 2036 03/17/20 0359 03/18/20 0547 03/20/20 0838  WBC 7.3   < > 6.3   < > 6.1 5.4 7.2  NEUTROABS 5.6  --   --   --  4.7 4.1  --   HGB 9.3*   < > 8.7*   < > 8.8* 8.5* 9.4*  HCT 34.5*   < > 32.3*   < > 32.2* 31.3* 35.6*  MCV 100.0  --  99.1  --  100.0 98.7 102.9*  PLT 182   < > 149*   < > 148* 148* 164   < > = values in this interval not displayed.   Cardiac Enzymes: No results for input(s): CKTOTAL, CKMB, CKMBINDEX, TROPONINI in the last 168 hours. CBG: Recent Labs  Lab 03/20/20 1610 03/20/20 2020 03/21/20 0055 03/21/20 0440 03/21/20 0757  GLUCAP 125* 110* 156* 110* 139*    Iron  Studies: No results for input(s): IRON, TIBC, TRANSFERRIN, FERRITIN in the last 72 hours. Studies/Results: No results found. Marland Kitchen acetaminophen (TYLENOL) oral liquid 160 mg/5 mL  1,000 mg Per Tube Q8H  . amLODipine  10 mg Per Tube Daily  . busPIRone  5 mg Per Tube BID  . calcium acetate  667 mg Oral TID WC  . camphor-menthol   Topical QID  . chlorhexidine gluconate (MEDLINE KIT)  15 mL Mouth Rinse BID  . Chlorhexidine Gluconate Cloth  6 each Topical Q0600  . darbepoetin (ARANESP) injection - DIALYSIS  200 mcg Subcutaneous Q Wed-1800  . feeding supplement (PRO-STAT SUGAR FREE 64)  30 mL Per Tube BID  . heparin injection (subcutaneous)  5,000 Units Subcutaneous Q8H  . insulin aspart  0-9 Units Subcutaneous Q4H  . insulin glargine  8 Units Subcutaneous QHS  . lactulose  30 g Per Tube BID  . pantoprazole sodium  40 mg Per Tube BID  . PARoxetine  40 mg Per Tube Daily  . sodium chloride flush  10-40 mL Intracatheter Q12H    BMET    Component Value Date/Time   NA 139 03/20/2020 0837   K 4.1 03/20/2020 0837   CL 97 (L) 03/20/2020 0837   CO2 29 03/20/2020 0837   GLUCOSE 152 (H) 03/20/2020 0837   BUN 69 (H) 03/20/2020 0837   CREATININE 1.93 (H) 03/20/2020 0837   CALCIUM 10.1 03/20/2020 0837    GFRNONAA 26 (L) 03/20/2020 0837   GFRAA 30 (L) 03/20/2020 0837   CBC    Component Value Date/Time   WBC 7.2 03/20/2020 0838   RBC 3.46 (L) 03/20/2020 0838   HGB 9.4 (L) 03/20/2020 0838   HCT 35.6 (L) 03/20/2020 0838   PLT 164 03/20/2020 0838   MCV 102.9 (H) 03/20/2020 0838   MCH 27.2 03/20/2020 0838   MCHC 26.4 (L) 03/20/2020 0838   RDW 17.1 (H) 03/20/2020 0838   LYMPHSABS 0.6 (L) 03/18/2020 0547   MONOABS 0.5 03/18/2020 0547   EOSABS 0.1 03/18/2020 0547   BASOSABS 0.1 03/18/2020 0547    Assessment/Plan:  1. New ESRD in setting of OliguricAKI/CKD stage 3- due to ischemic ATN in setting of sepsis. She has remained dialysis dependent since 01/09/20 and will require ongoing IHD. Now on MWF schedule for outpatient HD at [facility], [place]- has been accepted there.  1. Using TDC 2. Tolerating HD in recliner 3. Continue with MWF schedule-last dialysis on Saturday.  Now planning to discharge to continue Monday Wednesday Friday outpatient in [place]. 2. AMS- continues to wax and wane.  Started on lactulose for elevated ammonia and bipap nightly for acute hypercapneic resp failure.  Tired but stable today 3. Anemia of CKD- on high dose ESA 4. H/o PEA arrest 5. HFpEF-  volume overload. Will UF as tolerated  6. DM- per primary 7. 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 Was on trach but decannulated on 01/23/20. Continue with BiPAP nightly. 8. Bones-  Phos is good on phoslo-  PTH 59 at last check - no meds 9. Disposition- for discharge to SNF and arranged for outpatient HD in [place], [place], planning to start outpatient on Monday and discharge today  [place] [phone]

## 2020-03-21 NOTE — TOC Transition Note (Signed)
Transition of Care [facility]) - CM/SW Discharge Note   Patient Details  Name: Bailey Cox MRN: [id number] Date of Birth: [birth year]  [facility] CM/SW Contact:  Jacquelynn Cree Phone Number: 03/21/2020, 1:37 PM   Clinical Narrative:    Patient will DC to: [facility]  Anticipated DC date: 03/21/20 Family notified: Katharine Look, daughter Transport by: Jolly Mango   Per MD patient ready for DC to [place]. RN, patient, patient's family, and facility notified of DC. Discharge Summary and FL2 sent to facility via fax [phone]. RN to call report prior to discharge [phone]). DC packet on chart. Ambulance transport requested for patient.   CSW will sign off for now as social work intervention is no longer needed. Please consult [place] again if new needs arise.    Final next level of care: [place] Barriers to Discharge: No Barriers Identified   Patient Goals and CMS Choice Patient states their goals for this hospitalization and ongoing recovery are:: get stronger, continue dialysis (per [place] meeting 4/2) CMS Medicare.gov Compare Post Acute Care list provided to:: Patient Represenative (must comment)(Sandra) Choice offered to / list presented to : Adult Children  Discharge Placement              Patient chooses bed at: ([facility]) Patient to be transferred to facility by: [facility] Name of family member notified: Katharine Look Patient and family notified of of transfer: 03/21/20  Discharge Plan and Services In-house Referral: Clinical Social Work Discharge Planning Services: CM Consult Post Acute Care Choice: [place]                               Social Determinants of Health (SDOH) Interventions     Readmission Risk Interventions Readmission Risk Prevention Plan 01/28/2020  Transportation Screening Complete  PCP or Specialist Appt within 3-5 Days Not Complete  Not Complete comments plan for SNF  HRI or [place] Not Complete  HRI or Home Care Consult comments plan for SNF  Social Work Consult for Independence Planning/Counseling Complete  Palliative Care Screening Complete  Medication Review [employer]) Referral to Pharmacy  PCP or Specialist appointment within 3-5 days of discharge Not Complete  PCP/Specialist Appt Not Complete comments plan for SNF  HRI or Home Care Consult Complete  SW Recovery Care/Counseling Consult Complete  Palliative Care Screening Not Complete  Comments appropriate at this time  [place] Complete

## 2020-04-27 DIAGNOSIS — Z515 Encounter for palliative care: Secondary | ICD-10-CM

## 2020-07-31 IMAGING — DX DG ABD PORTABLE 1V
2 series · 2 of 2 positions shown · non-contrast
Comparison: 02/02/2020

CLINICAL DATA: Abdominal pain

EXAM:
PORTABLE ABDOMEN - 1 VIEW

[abdomen kub (1 of 2)]
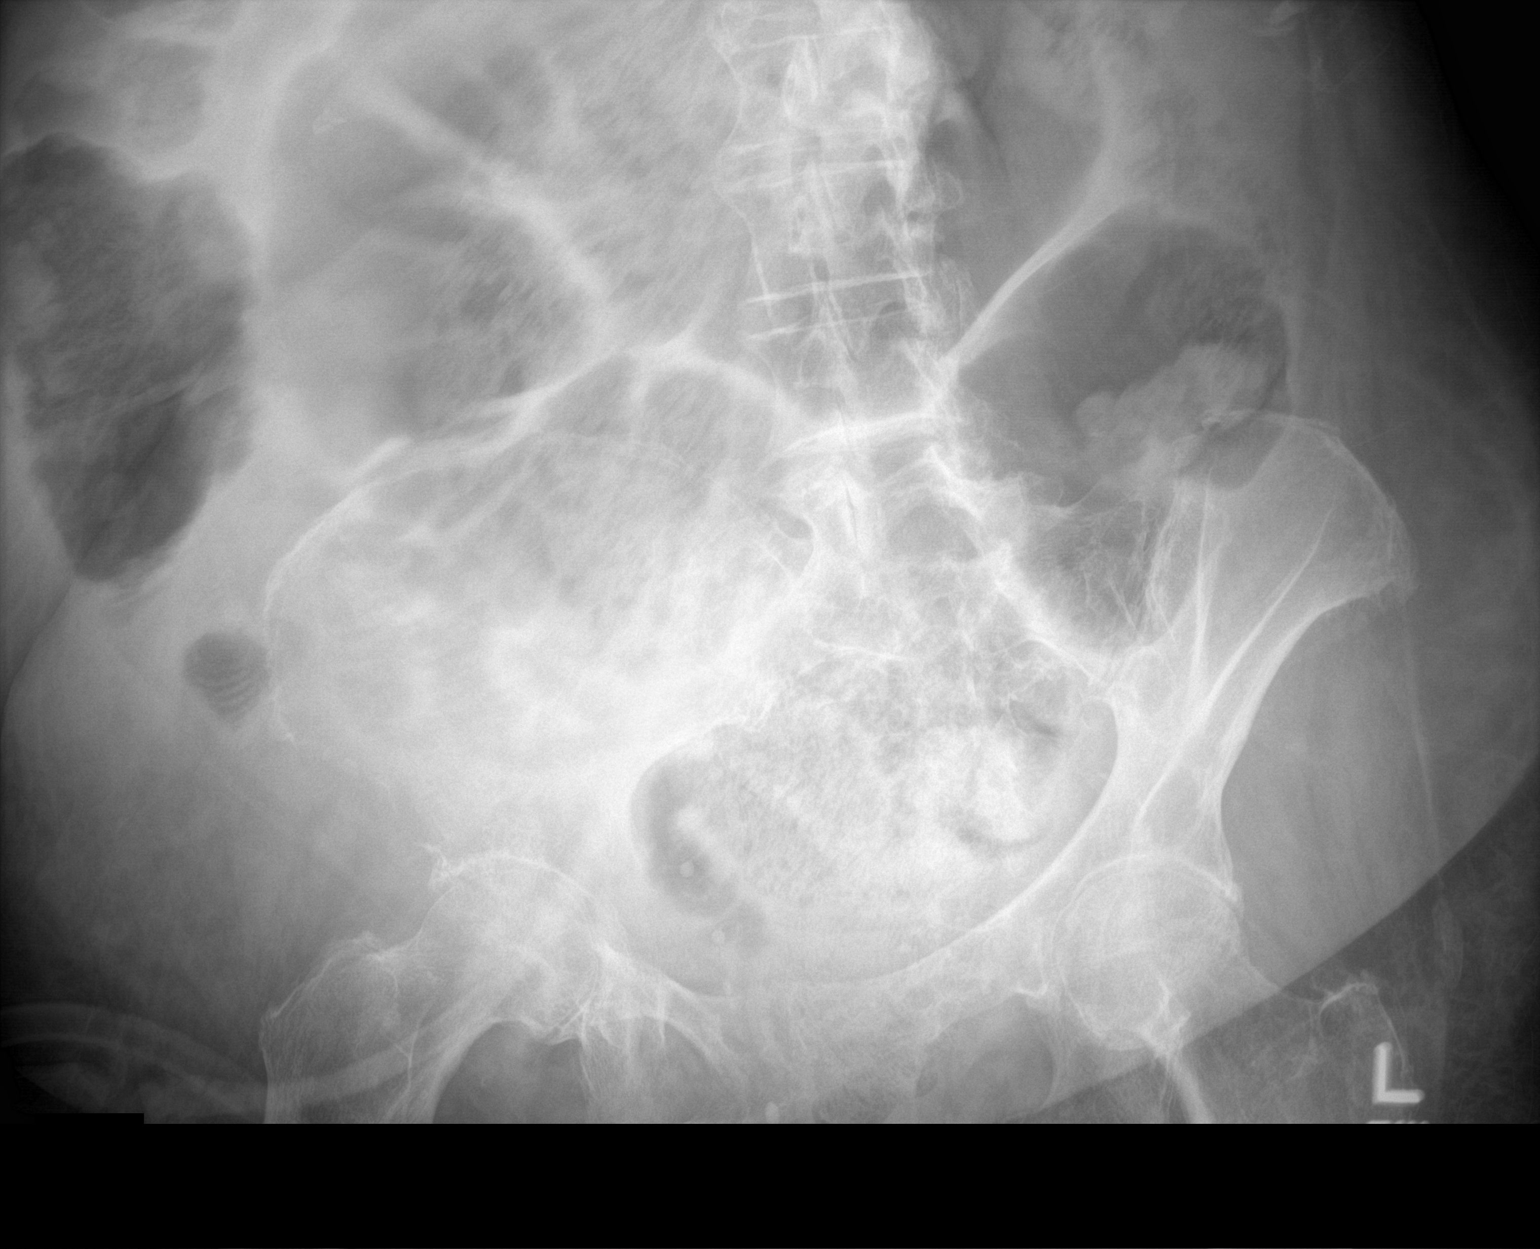

[abdomen kub (2 of 2)]
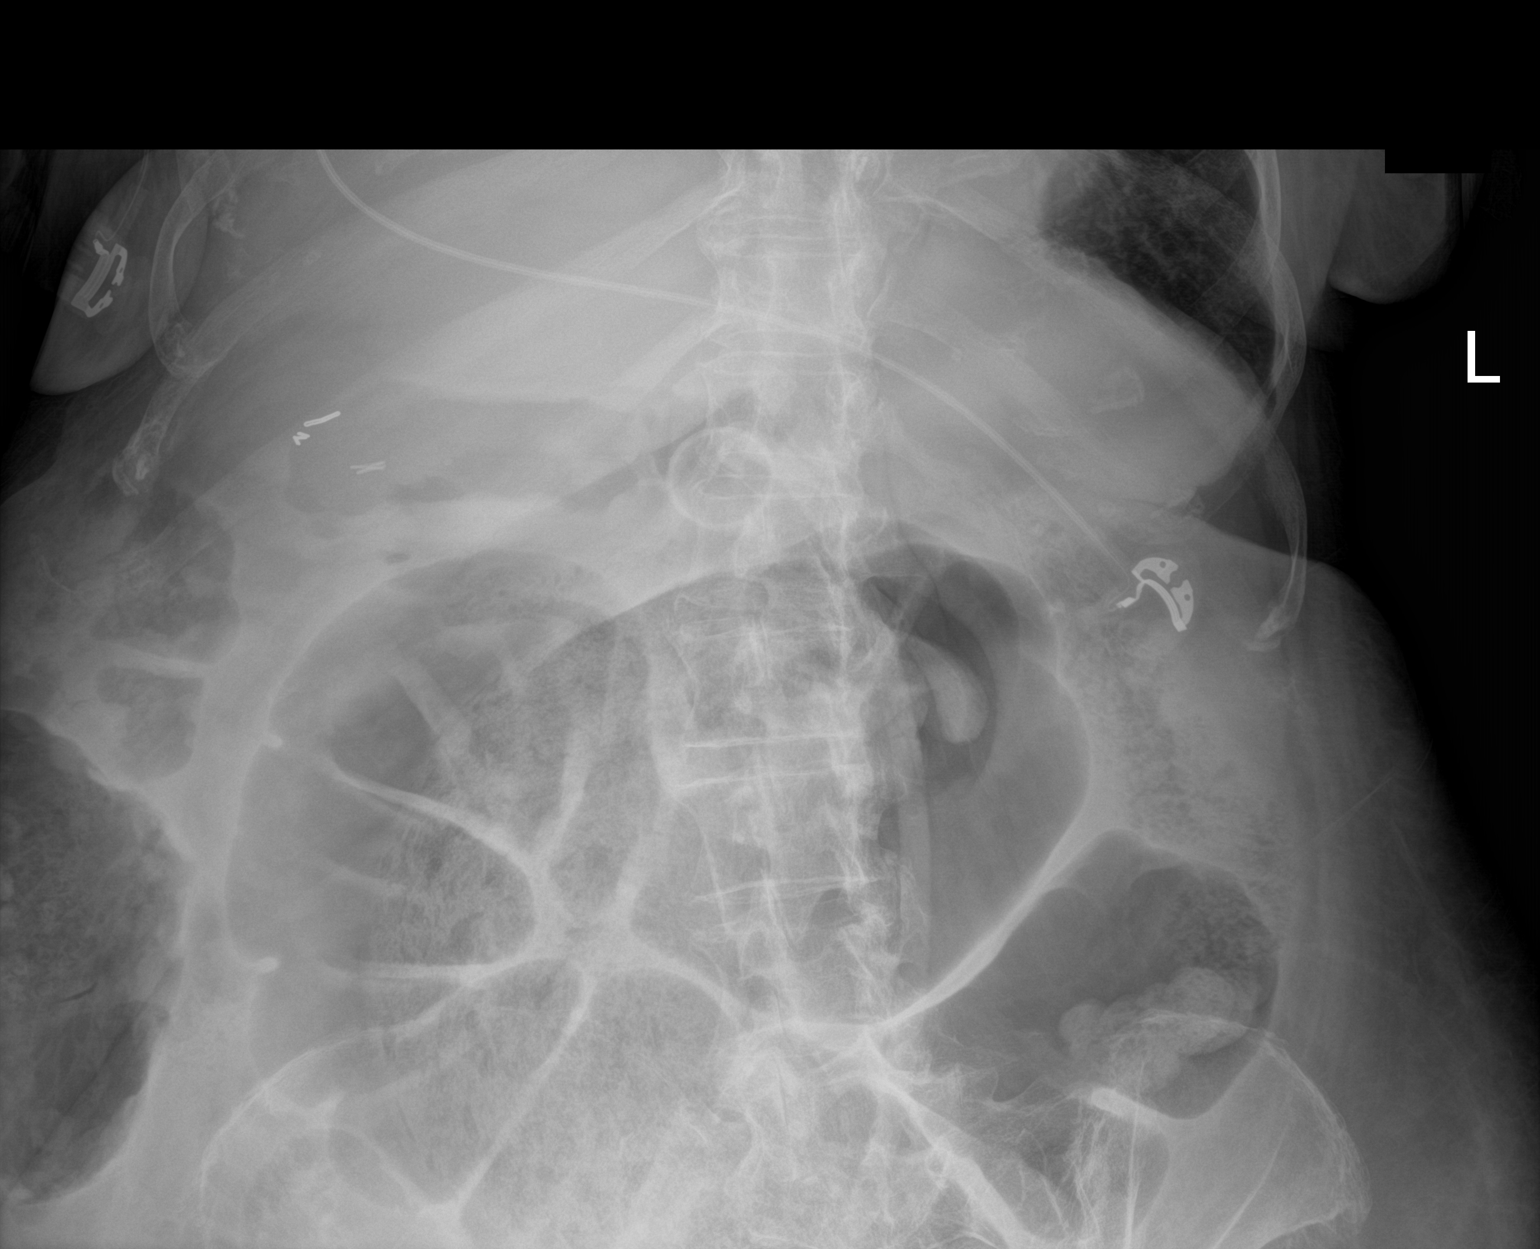

[2 of 2 positions shown; findings below may reference images not displayed]

FINDINGS: Scattered large and small bowel gas is noted. Gastrostomy catheter
is seen within the stomach. Mild to moderate retained fecal material
is seen without obstruction. No free air is noted.
IMPRESSION: Retained fecal material without obstruction. No other focal
abnormality is noted.

## 2020-08-02 IMAGING — DX DG CHEST 1V PORT
1 series · 1 of 1 positions shown · non-contrast
Comparison: February 16, 2020

CLINICAL DATA: Shortness of breath

EXAM:
PORTABLE CHEST 1 VIEW

[chest]
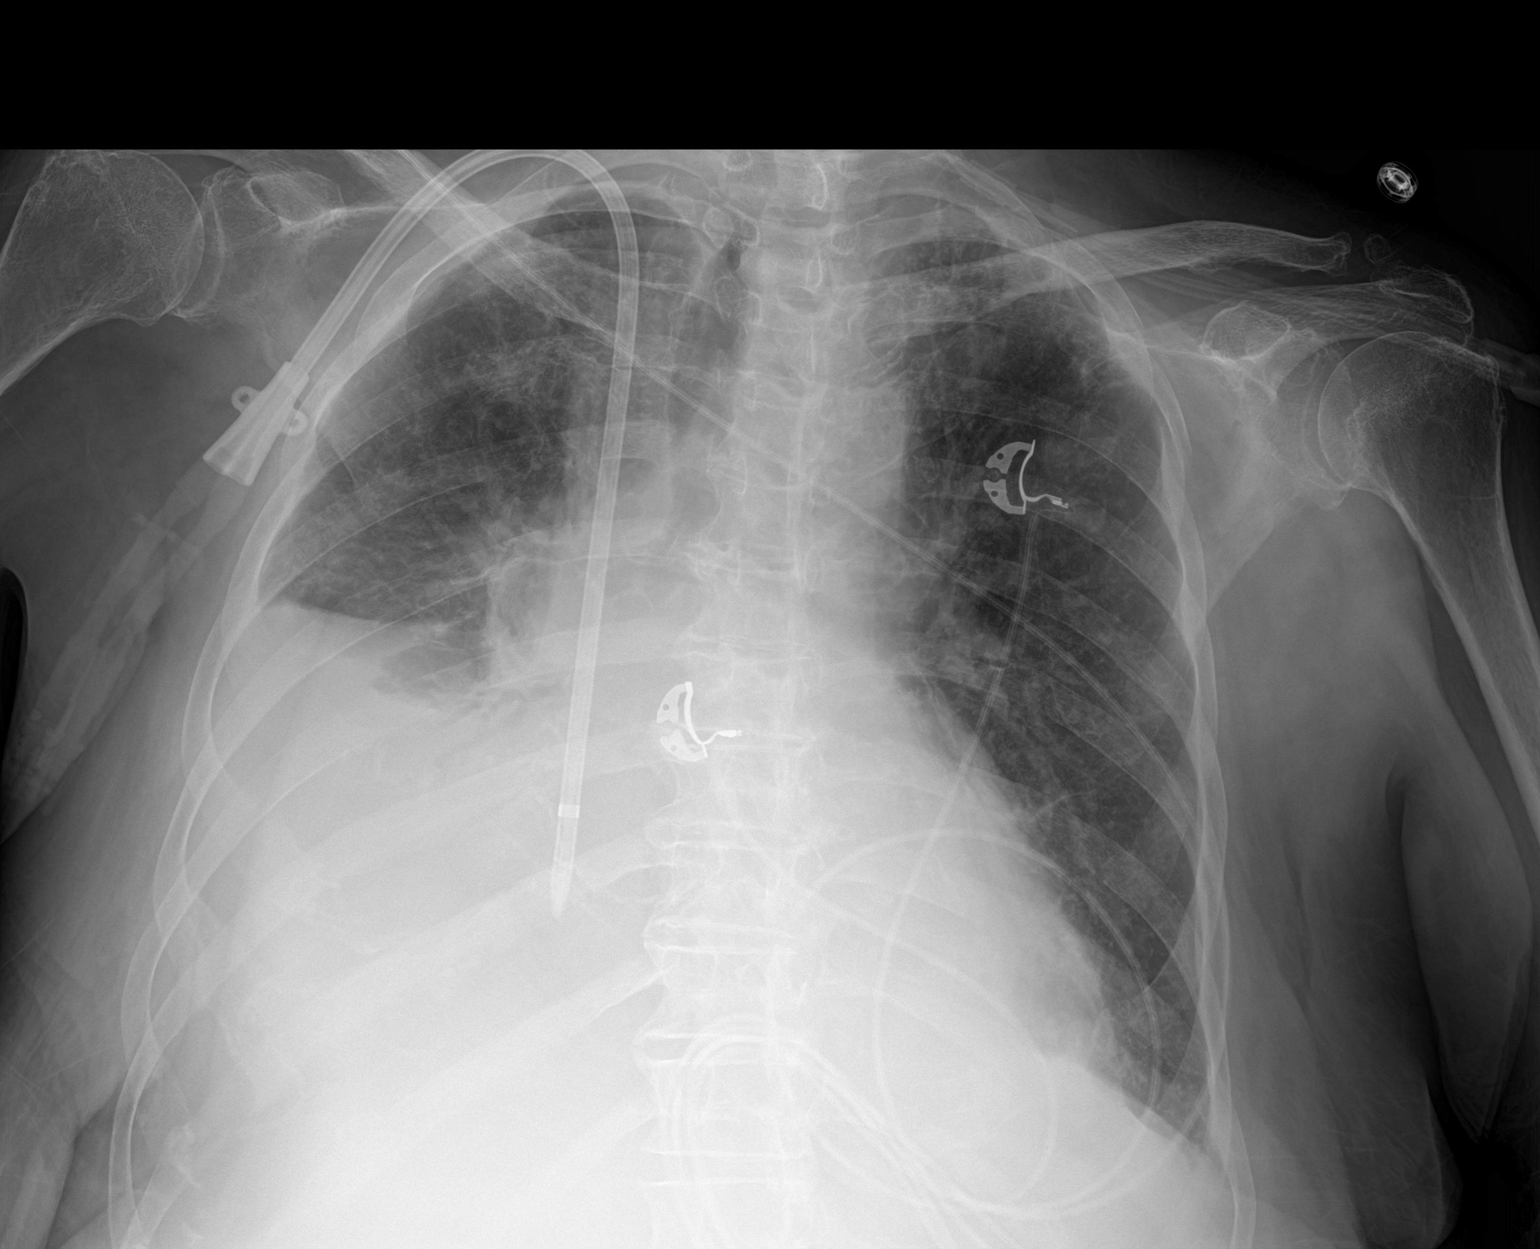

[1 of 1 positions shown; findings below may reference images not displayed]

FINDINGS: There is a fairly sizable right pleural effusion with consolidation
in portions of the right middle and lower lobes. There is
consolidation in the medial left base. There is scarring in each
apical region.

Heart size is normal. Pulmonary vascularity is within normal limits.
No adenopathy appreciable.

Central catheter tip is at the cavoatrial junction. No evident
pneumothorax. Bones osteoporotic.
IMPRESSION: Fairly sizable right pleural effusion with consolidation involving
much of the right middle and lower lobes. Consolidation medial left
base. Suspect a degree of multifocal pneumonia. Stable scarring
toward the apices on each side.

Stable cardiac silhouette. Stable central catheter position. No
pneumothorax.

## 2020-08-06 DEATH — deceased

## 2020-08-09 IMAGING — DX DG CHEST 1V PORT
1 series · 1 of 1 positions shown · non-contrast
Comparison: 02/18/2020

CLINICAL DATA: Abnormal chest x-ray

EXAM:
PORTABLE CHEST 1 VIEW

[chest ap]
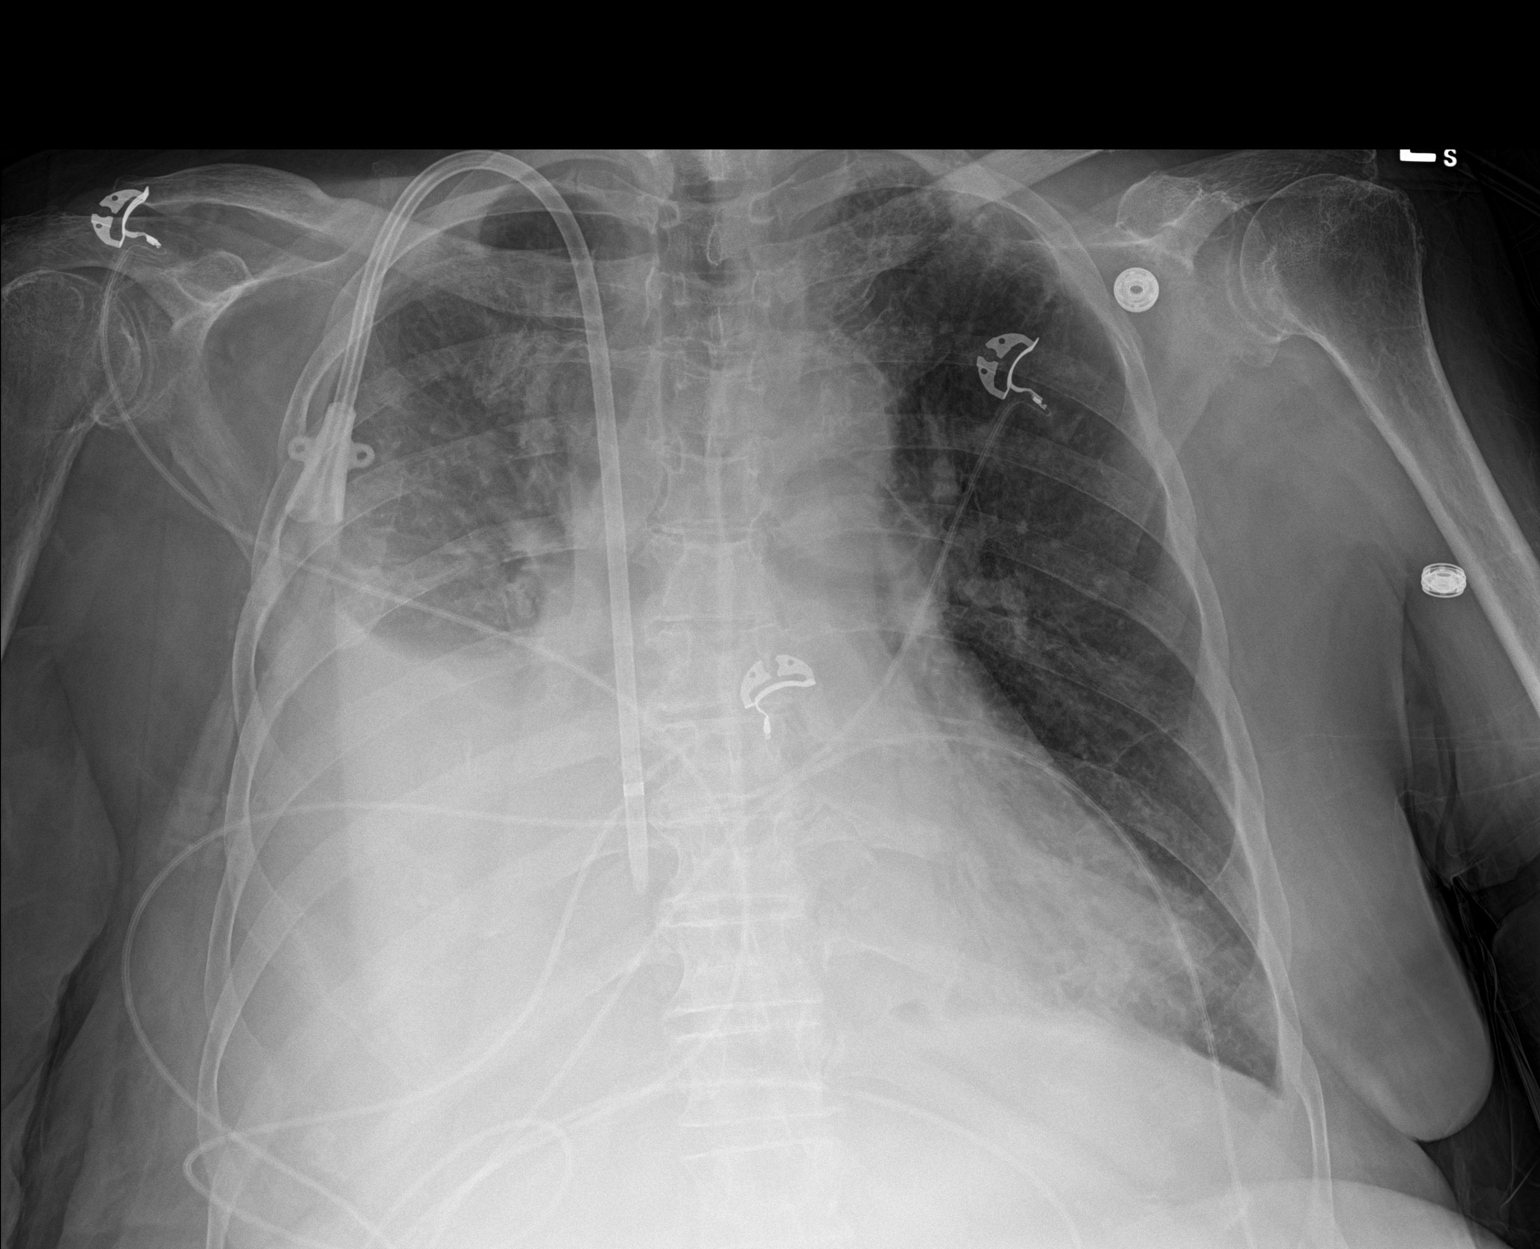

[1 of 1 positions shown; findings below may reference images not displayed]

FINDINGS: Right-sided central venous catheter tip over the right atrium.
Moderate to large right pleural effusion without significant change.
Probable small left effusion. Enlarged cardiomediastinal silhouette.
Slightly improved aeration at the left base with residual
atelectasis or pneumonia. No change in dense right middle lobe and
basilar consolidation.
IMPRESSION: 1. Moderate to large right pleural effusion grossly unchanged with
persistent dense airspace disease at the right middle lobe and right
base
2. Probable small left pleural effusion. Slightly improved aeration
at left base with residual atelectasis or pneumonia at the medial
left base
3. Cardiomegaly

## 2020-08-11 IMAGING — DX DG CHEST 1V PORT
1 series · 1 of 1 positions shown · non-contrast
Comparison: 02/26/2020

CLINICAL DATA: Pleural effusion.

EXAM:
PORTABLE CHEST 1 VIEW

[chest ap]
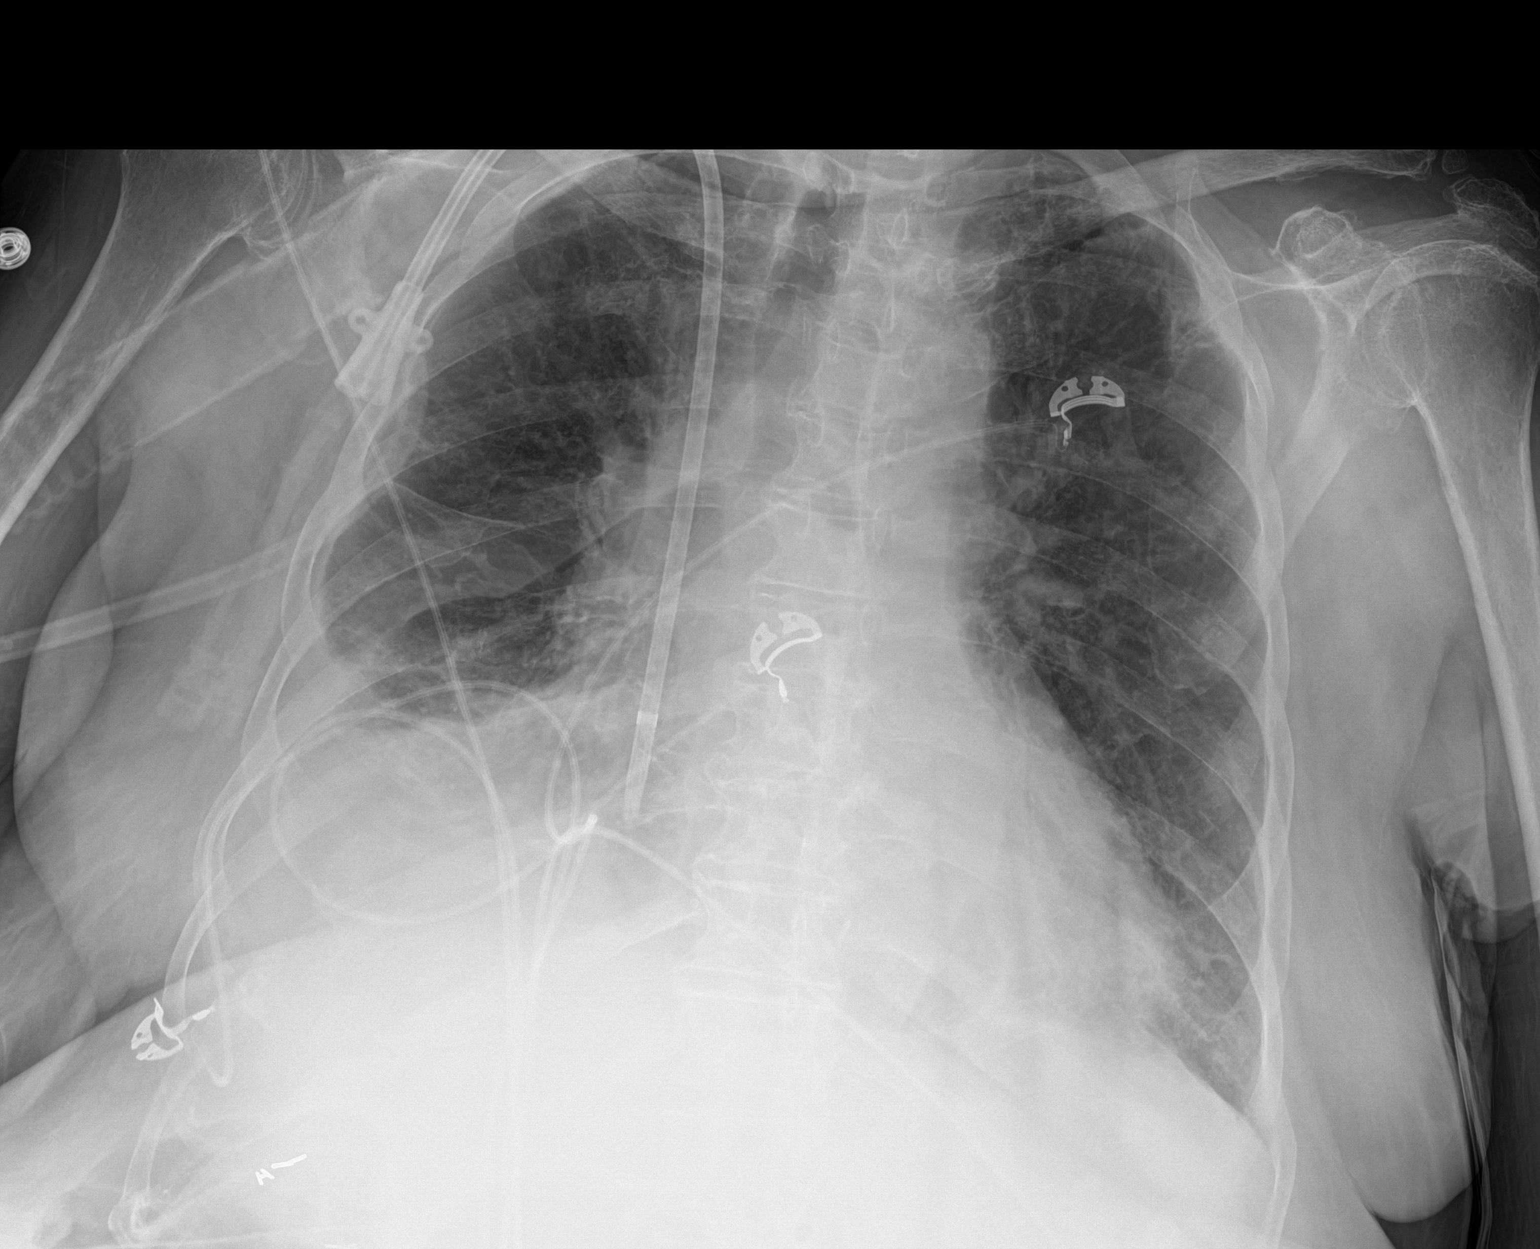

[1 of 1 positions shown; findings below may reference images not displayed]

FINDINGS: The right IJ catheter is stable. The tip is in the right atrium.

Stable mild cardiac enlargement. Mediastinal and hilar contours are
stable.

Persistent right effusion and overlying atelectasis. Persistent left
basilar atelectasis. No new findings.
IMPRESSION: Persistent right effusion and overlying atelectasis.
